# Patient Record
Sex: Female | Born: 1935 | Race: Black or African American | Hispanic: No | Marital: Single | Smoking: Former smoker
Health system: Southern US, Community
[De-identification: ages and names within clinical notes are randomized; demographics above are authoritative.]

## PROBLEM LIST (undated history)

## (undated) DIAGNOSIS — R790 Abnormal level of blood mineral: Secondary | ICD-10-CM

## (undated) DIAGNOSIS — R1110 Vomiting, unspecified: Secondary | ICD-10-CM

## (undated) DIAGNOSIS — I10 Essential (primary) hypertension: Secondary | ICD-10-CM

## (undated) DIAGNOSIS — M6282 Rhabdomyolysis: Secondary | ICD-10-CM

## (undated) DIAGNOSIS — N183 Chronic kidney disease, stage 3 unspecified: Secondary | ICD-10-CM

## (undated) DIAGNOSIS — Z8679 Personal history of other diseases of the circulatory system: Secondary | ICD-10-CM

## (undated) DIAGNOSIS — E876 Hypokalemia: Secondary | ICD-10-CM

## (undated) DIAGNOSIS — C3490 Malignant neoplasm of unspecified part of unspecified bronchus or lung: Secondary | ICD-10-CM

## (undated) DIAGNOSIS — I442 Atrioventricular block, complete: Secondary | ICD-10-CM

## (undated) DIAGNOSIS — Z950 Presence of cardiac pacemaker: Secondary | ICD-10-CM

## (undated) DIAGNOSIS — A0472 Enterocolitis due to Clostridium difficile, not specified as recurrent: Secondary | ICD-10-CM

## (undated) DIAGNOSIS — K219 Gastro-esophageal reflux disease without esophagitis: Secondary | ICD-10-CM

## (undated) DIAGNOSIS — I272 Pulmonary hypertension, unspecified: Secondary | ICD-10-CM

## (undated) DIAGNOSIS — R0600 Dyspnea, unspecified: Secondary | ICD-10-CM

## (undated) DIAGNOSIS — E7211 Homocystinuria: Secondary | ICD-10-CM

## (undated) DIAGNOSIS — I639 Cerebral infarction, unspecified: Secondary | ICD-10-CM

## (undated) DIAGNOSIS — I480 Paroxysmal atrial fibrillation: Secondary | ICD-10-CM

## (undated) DIAGNOSIS — R269 Unspecified abnormalities of gait and mobility: Secondary | ICD-10-CM

## (undated) DIAGNOSIS — E785 Hyperlipidemia, unspecified: Secondary | ICD-10-CM

## (undated) DIAGNOSIS — M109 Gout, unspecified: Secondary | ICD-10-CM

## (undated) DIAGNOSIS — Z8673 Personal history of transient ischemic attack (TIA), and cerebral infarction without residual deficits: Secondary | ICD-10-CM

## (undated) HISTORY — DX: Homocystinuria: E72.11

## (undated) HISTORY — DX: Personal history of transient ischemic attack (TIA), and cerebral infarction without residual deficits: Z86.73

## (undated) HISTORY — PX: CARDIAC CATHETERIZATION: SHX172

## (undated) HISTORY — DX: Personal history of other diseases of the circulatory system: Z86.79

## (undated) HISTORY — DX: Hypokalemia: E87.6

## (undated) HISTORY — DX: Atrioventricular block, complete: I44.2

## (undated) HISTORY — DX: Unspecified abnormalities of gait and mobility: R26.9

## (undated) HISTORY — PX: PACEMAKER INSERTION: SHX728

## (undated) HISTORY — DX: Hyperlipidemia, unspecified: E78.5

## (undated) HISTORY — DX: Presence of cardiac pacemaker: Z95.0

---

## 1997-11-07 ENCOUNTER — Emergency Department (HOSPITAL_COMMUNITY): Admission: EM | Admit: 1997-11-07 | Discharge: 1997-11-07 | Payer: Self-pay | Admitting: Emergency Medicine

## 1997-11-18 ENCOUNTER — Emergency Department (HOSPITAL_COMMUNITY): Admission: EM | Admit: 1997-11-18 | Discharge: 1997-11-18 | Payer: Self-pay | Admitting: Emergency Medicine

## 2002-06-18 ENCOUNTER — Inpatient Hospital Stay (HOSPITAL_COMMUNITY): Admission: EM | Admit: 2002-06-18 | Discharge: 2002-06-23 | Payer: Self-pay

## 2002-06-18 ENCOUNTER — Encounter: Payer: Self-pay | Admitting: Emergency Medicine

## 2002-06-18 ENCOUNTER — Encounter: Payer: Self-pay | Admitting: Cardiology

## 2002-06-21 ENCOUNTER — Encounter: Payer: Self-pay | Admitting: Pediatrics

## 2002-06-21 ENCOUNTER — Encounter: Payer: Self-pay | Admitting: Neurology

## 2002-06-22 ENCOUNTER — Encounter: Payer: Self-pay | Admitting: Cardiology

## 2002-06-23 ENCOUNTER — Inpatient Hospital Stay (HOSPITAL_COMMUNITY)
Admission: RE | Admit: 2002-06-23 | Discharge: 2002-07-08 | Payer: Self-pay | Admitting: Physical Medicine & Rehabilitation

## 2002-08-26 ENCOUNTER — Encounter
Admission: RE | Admit: 2002-08-26 | Discharge: 2002-11-24 | Payer: Self-pay | Admitting: Physical Medicine & Rehabilitation

## 2004-09-07 ENCOUNTER — Ambulatory Visit (HOSPITAL_COMMUNITY): Admission: RE | Admit: 2004-09-07 | Discharge: 2004-09-07 | Payer: Self-pay | Admitting: Family Medicine

## 2004-09-21 ENCOUNTER — Ambulatory Visit (HOSPITAL_COMMUNITY): Admission: RE | Admit: 2004-09-21 | Discharge: 2004-09-22 | Payer: Self-pay | Admitting: *Deleted

## 2010-02-06 ENCOUNTER — Ambulatory Visit: Payer: Self-pay | Admitting: Cardiology

## 2010-02-18 ENCOUNTER — Encounter: Payer: Self-pay | Admitting: Internal Medicine

## 2010-03-08 ENCOUNTER — Ambulatory Visit: Payer: Self-pay | Admitting: Internal Medicine

## 2010-05-30 NOTE — Miscellaneous (Signed)
Summary: Device preload  Clinical Lists Changes  Observations: Added new observation of PPM INDICATN: CHB (02/18/2010 17:22) Added new observation of MAGNET RTE: BOL 85 ERI 65 (02/18/2010 17:22) Added new observation of PPMLEADSTAT2: active (02/18/2010 17:22) Added new observation of PPMLEADSER2: [id number] (02/18/2010 17:22) Added new observation of PPMLEADMOD2: 5076  (02/18/2010 17:22) Added new observation of PPMLEADDOI2: 09/21/2004  (02/18/2010 17:22) Added new observation of PPMLEADLOC2: RV  (02/18/2010 17:22) Added new observation of PPMLEADSTAT1: active  (02/18/2010 17:22) Added new observation of PPMLEADSER1: [id number]  (02/18/2010 17:22) Added new observation of PPMLEADMOD1: 5076  (02/18/2010 17:22) Added new observation of PPMLEADDOI1: 09/21/2004  (02/18/2010 17:22) Added new observation of PPMLEADLOC1: RA  (02/18/2010 17:22) Added new observation of PPM DOI: 09/21/2004  (02/18/2010 17:22) Added new observation of PPM SERL#: [id number]  (02/18/2010 17:22) Added new observation of PPM MODL#: [id number]  (02/18/2010 62:22) Added new observation of PACEMAKERMFG: Medtronic  (02/18/2010 17:22) Added new observation of PPM IMP MD: Charlynn Court  (02/18/2010 17:22) Added new observation of PPM REFER MD: Peter Swaziland, MD  (02/18/2010 17:22) Added new observation of PACEMAKER MD: Sherryl Manges, MD  (02/18/2010 17:22)      PPM Specifications Following MD:  Sherryl Manges, MD     Referring MD:  Peter Swaziland, MD PPM Vendor:  Medtronic     PPM Model Number:  [id number]     PPM Serial Number:  [id number] PPM DOI:  09/21/2004     PPM Implanting MD:  Charlynn Court  Lead 1    Location: RA     DOI: 09/21/2004     Model #: 1448     Serial #: [id number]     Status: active Lead 2    Location: RV     DOI: 09/21/2004     Model #: 2637     Serial #: [id number]     Status: active  Magnet Response Rate:  BOL 85 ERI 65  Indications:  CHB

## 2010-05-30 NOTE — Procedures (Signed)
Summary: pacer check/medtronic   Current Medications (verified): 1)  Simvastatin 40 Mg Tabs (Simvastatin) .... One By Mouth Daily 2)  Metoprolol Succinate 50 Mg Xr24h-Tab (Metoprolol Succinate) .... 1/2 By Mouth Daily 3)  Plavix 75 Mg Tabs (Clopidogrel Bisulfate) .... One By Mouth Daily 4)  Amlodipine Besy-Benazepril Hcl 5-20 Mg Caps (Amlodipine Besy-Benazepril Hcl) .... One By Mouth Daily 5)  Aspir-Low 81 Mg Tbec (Aspirin) .... One By Mouth Daily  Allergies (verified): No Known Drug Allergies  PPM Specifications Following MD:  Sherryl Manges, MD     Referring MD:  Peter Swaziland, MD PPM Vendor:  Medtronic     PPM Model Number:  [id number]     PPM Serial Number:  [id number] PPM DOI:  09/21/2004     PPM Implanting MD:  Charlynn Court  Lead 1    Location: RA     DOI: 09/21/2004     Model #: 5076     Serial #: [id number]     Status: active Lead 2    Location: RV     DOI: 09/21/2004     Model #: 1308     Serial #: [id number]     Status: active  Magnet Response Rate:  BOL 85 ERI 65  Indications:  CHB   PPM Follow Up Battery Voltage:  2.75 V     Battery Est. Longevity:  4.5 yrs       PPM Device Measurements Atrium  Amplitude: 5.60 mV, Impedance: 480 ohms, Threshold: 0.50 V at 0.40 msec Right Ventricle  Amplitude: 4.00 mV, Impedance: 548 ohms, Threshold: 0.750 V at 0.40 msec  Episodes MS Episodes:  45     Percent Mode Switch:  <0.1%     Ventricular High Rate:  0     Atrial Pacing:  19.8%     Ventricular Pacing:  99.9%r  Parameters Mode:  DDDR     Lower Rate Limit:  60     Upper Rate Limit:  130 Paced AV Delay:  150     Sensed AV Delay:  120 Next Cardiology Appt Due:  06/07/2010 Tech Comments:  3 AHR EPISODES--LONGEST WAS 2 MINUTES 11 SECONDS. NORMAL DEVICE FUNCTION. CHANGED RA OUTPUT FROM 1.00 TO 2.00 AND RV OUTPUT FROM 2.00 TO 2.50 V. PT IS ENROLLED IN [facility].  ROV 06-07-10 @ 1000 W/SK. Vella Kohler  March 08, 2010 1:22 PM

## 2010-06-07 ENCOUNTER — Encounter: Payer: Self-pay | Admitting: Internal Medicine

## 2010-06-07 ENCOUNTER — Encounter (INDEPENDENT_AMBULATORY_CARE_PROVIDER_SITE_OTHER): Payer: MEDICARE | Admitting: Internal Medicine

## 2010-06-07 DIAGNOSIS — I6350 Cerebral infarction due to unspecified occlusion or stenosis of unspecified cerebral artery: Secondary | ICD-10-CM | POA: Insufficient documentation

## 2010-06-07 DIAGNOSIS — I442 Atrioventricular block, complete: Secondary | ICD-10-CM | POA: Insufficient documentation

## 2010-06-07 DIAGNOSIS — I119 Hypertensive heart disease without heart failure: Secondary | ICD-10-CM

## 2010-06-15 NOTE — Assessment & Plan Note (Signed)
Summary: pc2/no avail pacer slots/sl/kl   CC:  pacer check. Pt states she is doing well.  She has no complaints at this time.  History of Present Illness: Mis seen to establish pacemaker followup. She is a 77 year old woman with a history of complete heart block status post pacemaker implantation 2006.  Her related medical history is notable for hypertension prior stroke x2 and dyslipidemia. She has no prior history of atrial fibrillation.   embolic risk factors include hypertension tender prior stroke age-x1  Preventive Screening-Counseling & Management  Alcohol-Tobacco     Smoking Status: quit  Caffeine-Diet-Exercise     Does Patient Exercise: no  Current Medications (verified): 1)  Simvastatin 40 Mg Tabs (Simvastatin) .... One By Mouth Daily 2)  Metoprolol Succinate 50 Mg Xr24h-Tab (Metoprolol Succinate) .... 1/2 By Mouth Daily 3)  Plavix 75 Mg Tabs (Clopidogrel Bisulfate) .... One By Mouth Daily 4)  Amlodipine Besy-Benazepril Hcl 5-20 Mg Caps (Amlodipine Besy-Benazepril Hcl) .... One By Mouth Daily 5)  Aspir-Low 81 Mg Tbec (Aspirin) .... One By Mouth Daily 6)  Omeprazole 20 Mg Cpdr (Omeprazole) .... Take One Capsule Once Daily  Allergies (verified): No Known Drug Allergies  Past History:  Family History: Last updated: 06/07/2010 Positive for hypertension  Social History: Last updated: 06/07/2010 Tobacco Use - Former. quit 2007 widowed mother of 3 ; she also raised her biological grandson and his sister the former is in prison Alcohol Use - no Regular Exercise - no  Past Medical History: Pacemaker implanted-2006  Medtronic [phone] complete heart block hypertension hypercholesterolemia CVA  Past Surgical History: DDD pacemaker implant-Medtronic EnPulse 2006  Family History: Positive for hypertension  Social History: Tobacco Use - Former. quit 2007 widowed mother of 3 ; she also raised her biological grandson and his sister the former is in prison Alcohol  Use - no Regular Exercise - no Smoking Status:  quit Does Patient Exercise:  no  Review of Systems  The patient denies anorexia, fever, weight loss, weight gain, decreased hearing, hoarseness, syncope, prolonged cough, abdominal pain, melena, suspicious skin lesions, transient blindness, unusual weight change, abnormal bleeding, and angioedema.    Vital Signs:  Patient profile:   77 year old female Height:      62 inches Weight:      204 pounds BMI:     37.45 Pulse rate:   82 / minute Pulse rhythm:   regular BP sitting:   146 / 90  (left arm) Cuff size:   large  Vitals Entered By: Judithe Modest CMA (June 07, 2010 9:50 AM)  Physical Exam  General:  The patient was alert and oriented in no acute distress. HEENT Normal.  Neck veins were flat, carotids were brisk.  Lungs were clear.  Heart sounds were regular without murmurs or gallops.  Abdomen was soft with active bowel sounds. There is no clubbing cyanosis or edema. Skin Warm and dry gait is wide-based and she uses a stick pacemaker pocket is on the left and is well-healed   CXR  Procedure date:  09/21/2004  Findings:         Clinical Data:    Pacemaker insertion.   CHEST - 2 VIEWS:   Comparison:   09/07/04.   Findings:   A left subclavian pacemaker device has been placed.   The   tips of the leads are in the right atrium and right ventricle.  No   pneumothoraces are seen.  The heart is normal in size.  The lungs are  clear.   IMPRESSION:   Left subclavian pacemaker placement without pneumothorax.    Read By:  Jolaine Click,  M.D.  Echocardiogram  Procedure date:  06/18/2002  Findings:        SUMMARY   -  Overall left ventricular systolic function was normal. Left         ventricular ejection fraction was estimated , range being 55         % to 65 %. Left ventricular wall thickness was mildly         increased. There was mild focal basal septal hypertrophy.         There was an increased relative  contribution of atrial         contraction to left ventricular filling.   -  Aortic valve thickness was mildly increased.   -  Left atrial size was at the upper limits of normal.   -  No cardiac source of embolus.    IMPRESSIONS   -  No cardiac source of embolus.     ---------------------------------------------------------------    Prepared and signed by    Clovis Pu. Brackbill M.D.   Confirmed 18-Jun-2002 23:32:42   PPM Specifications Following MD:  Sherryl Manges, MD     Referring MD:  Peter Swaziland, MD PPM Vendor:  Medtronic     PPM Model Number:  [id number]     PPM Serial Number:  [id number] PPM DOI:  09/21/2004     PPM Implanting MD:  Charlynn Court  Lead 1    Location: RA     DOI: 09/21/2004     Model #: 5076     Serial #: [id number]     Status: active Lead 2    Location: RV     DOI: 09/21/2004     Model #: 1308     Serial #: [id number]     Status: active  Magnet Response Rate:  BOL 85 ERI 65  Indications:  CHB   PPM Follow Up Battery Voltage:  2.74 V     Battery Est. Longevity:  3.5 yrs       PPM Device Measurements Atrium  Amplitude: 4.00 mV, Impedance: 501 ohms, Threshold: 0.50 V at 0.40 msec Right Ventricle  Amplitude: PACED mV, Impedance: 571 ohms, Threshold: O.750 V at 0.40 msec  Episodes MS Episodes:  2     Percent Mode Switch:  <0.1%     Ventricular High Rate:  0     Atrial Pacing:  28.5%     Ventricular Pacing:  99.9%  Parameters Mode:  DDDR     Lower Rate Limit:  60     Upper Rate Limit:  130 Paced AV Delay:  150     Sensed AV Delay:  120 Next Remote Date:  09/07/2010     Next Cardiology Appt Due:  06/01/2011 Tech Comments:  2 AHR EPISODES--LONGEST WAS 1 MIN 47 SECONDS.  NORMAL DEVICE FUNCTION. NO CHANGES MADE. [facility] 09-07-10 AND ROV IN 12 MTHS W/SK. Vella Kohler  June 07, 2010 10:50 AM  Impression & Recommendations:  Problem # 1:  ATRIAL FIBRILLATION-POSSIBLE (ICD-427.31) I ythink she may have atrial fibrillation  based on the ECG; If that were  true she would need oral anticoagulation as opposed aspirin and Plavix. There was on interrogation of the device as single atrial high rate episodes with cycle length in the low 300s range. This would not be consistent with atrial fibrillation. We will follow this via [facility] Her updated medication  list for this problem includes:    Metoprolol Succinate 50 Mg Xr24h-tab (Metoprolol succinate) .[name]... 1/2 by mouth daily    Plavix 75 Mg Tabs (Clopidogrel bisulfate) ..... One by mouth daily    Aspir-low 81 Mg Tbec (Aspirin) ..... One by mouth dai  Problem # 2:  PACEMAKER MDT DDD (ICD-V45.01) Device parameters and data were reviewed and no changes were made  Problem # 3:  CVA (ICD-434.91) as above Her updated medication list for this problem includes:    Plavix 75 Mg Tabs (Clopidogrel bisulfate) ..... One by mouth daily    Aspir-low 81 Mg Tbec (Aspirin) ..... One by mouth daily  Problem # 4:  HYPERTENSION, HEART CONTROLLED W/O ASSOC CHF (ICD-402.10) his relatively poorly controlled. She will her last notes from Dr. Swaziland demonstrated improved control so we'll defer any drug change it to him Her updated medication list for this problem includes:    Metoprolol Succinate 50 Mg Xr24h-tab (Metoprolol succinate) .[name]... 1/2 by mouth daily    Amlodipine Besy-benazepril Hcl 5-20 Mg Caps (Amlodipine besy-benazepril hcl) ..... One by mouth daily    Aspir-low 81 Mg Tbec (Aspirin) ..... One by mouth daily  Problem # 5:  AV BLOCK, COMPLETE (ICD-426.0) stable Her updated medication list for this problem includes:    Metoprolol Succinate 50 Mg Xr24h-tab (Metoprolol succinate) .[name]... 1/2 by mouth daily    Plavix 75 Mg Tabs (Clopidogrel bisulfate) ..... One by mouth daily    Amlodipine Besy-benazepril Hcl 5-20 Mg Caps (Amlodipine besy-benazepril hcl) ..... One by mouth daily    Aspir-low 81 Mg Tbec (Aspirin) ..... One by mouth daily  Patient Instructions: 1)  Your physician recommends that you schedule a  follow-up appointment in: 1 year with Dr. Graciela Husbands. 2)  [facility] device check to be done on Sep 07, 2010 3)  Your physician recommends that you continue on your current medications as directed. Please refer to the Current Medication list given to you today.

## 2010-07-06 NOTE — Cardiovascular Report (Signed)
Summary: Office Visit   Office Visit   Imported By: Roderic Ovens 06/26/2010 15:49:36  _____________________________________________________________________  External Attachment:    Type:   Image     Comment:   External Document

## 2010-09-15 NOTE — Discharge Summary (Signed)
[name], Allison           ACCOUNT NO.:  [id number]   MEDICAL RECORD NO.:  [id number]          PATIENT TYPE:  OIB   LOCATION:  4707                         FACILITY:  [facility]   PHYSICIAN:  Elmore Guise., M.D.DATE OF BIRTH:  December 25, 1933   DATE OF ADMISSION:  09/21/2004  DATE OF DISCHARGE:  09/22/2004                                 DISCHARGE SUMMARY   DISCHARGE DIAGNOSES:  1.  Complete heart block.  2.  Status post permanent pacemaker implant with Medtronic Enpulse dual-      chamber pacemaker.  3.  History of hypertension.  4.  History of stroke.   HISTORY OF PRESENT ILLNESS:  The patient is a very pleasant 77 year old  African-American female who presents with 1-2 month history of increasing  malaise. She was found to be bradycardiac and EKG showed complete heart  block. She was admitted for pacemaker implant.   HOSPITAL COURSE:  The patient underwent dual-chamber pacemaker implant on  09/21/2004. She tolerated the procedure well. She had no postprocedure  complications. Her chest x-ray today showed appropriate placement of her RA  and RV leads and no pneumothorax. Her pacemaker was interrogated and  functioning appropriately. She will be discharged home today to continue the  following medications.   DISCHARGE MEDICATIONS:  1.  Lotrel 5/20 milligrams once a day.  2.  Aspirin 81 milligrams once a day.  3.  Plavix 75 milligrams once a day.  4.  Augmentin 875 milligrams p.o. b.i.d. times 5 days.  5.  Tylenol Extra Strength 500 milligrams q.6 h on a p.r.n. basis.   DISCHARGE INSTRUCTIONS:  She was given and routine post pacemaker  restrictions not to get her site wet for the next 5-7 days. She was also  given Betadine swabs to Betadine the area daily for the next 3 days. Due to  the length of her procedure as well as the depth of her wound, she was  placed on prophylactic antibiotics. She had no fever or infection at the  site. She will follow up with Dr. Reyes Ivan at  [facility] in 7-10  days. She was to notify the office should she have any further questions or  concerns. A post pacemaker restriction sheet was given to the patient prior  to discharge.      TWK/MEDQ  D:  09/22/2004  T:  09/22/2004  Job:  [id number]

## 2010-09-15 NOTE — Discharge Summary (Signed)
   [name], SANDEFUR                       ACCOUNT NO.:  [id number]   MEDICAL RECORD NO.:  [id number]                   PATIENT TYPE:  INP   LOCATION:  3714                                 FACILITY:  [facility]   PHYSICIAN:  Michael L. Thad Ranger, M.D.           DATE OF BIRTH:  [birth year]   DATE OF ADMISSION:  06/18/2002  DATE OF DISCHARGE:  06/23/2002                                 DISCHARGE SUMMARY   DISCHARGE DIAGNOSES:  1. Right posterior internal capsule acute infarction secondary to small     vessel disease.  2. Hypertension.  3. Dyslipidemia.  4. Hyperhomocysteinemia.  5. Intermittent second degree arteriovenous block, asymptomatic.  6. Hypokalemia.   INCOMPLETE REPORT     Annie Main, N.P.                         Marolyn Hammock. Thad Ranger, M.D.    SB/MEDQ  D:  06/23/2002  T:  06/23/2002  Job:  [id number]   cc:   Cassell Clement, M.D.  [address]  [place]  [place] [address]  Fax: [phone]   Titus Dubin. Alwyn Ren, M.D. [facility]

## 2010-09-15 NOTE — Discharge Summary (Signed)
NAMETACHINA, SPOONEMORE                       ACCOUNT NO.:  [id number]   MEDICAL RECORD NO.:  [id number]                   PATIENT TYPE:  IPS   LOCATION:  4038                                 FACILITY:  [facility]   PHYSICIAN:  Mariam Dollar, P.A.               DATE OF BIRTH:  02/28/1934   DATE OF ADMISSION:  06/23/2002  DATE OF DISCHARGE:  07/08/2002                                 DISCHARGE SUMMARY   DISCHARGE DIAGNOSES:  1. PLIC acute infarction.  2. Hypertension.  3. Hypokalemia, resolved.  4. Intermittent atrioventricular block of which asymptomatic.  5. Tobacco abuse.  6. Hyperlipidemia.   HISTORY OF PRESENT ILLNESS:  A 77 year old, right-handed, black female with  a history of untreated hypertension who was admitted 06/18/2002 with left-  sided weakness and blood pressure 227/115 and bradycardia at 40 beats per  minute.  There was no chest pain, no nausea, or vomiting.  Upon evaluation,  cranial CT scan with acute subacute inferior infarction bilateral and basal  ganglia, left thalamus.  Also noted old right lacunar infarction.  Carotid  duplex negative.  Cardiac enzymes negative.  Placed on nicardipine drip and  blood pressure was monitored.  MRI with acute deep white matter infarction  affecting the posterior limb of the right internal capsule, chronic left  thalamic lacunar/small vessel disease.  MRA with widespread intracranial  atherosclerotic changes.  Per neurology consult with Dr. Thad Ranger, placed on  aspirin and Plavix therapy.  Cardiology followed with Dr. Patty Sermons for  increased blood pressure and bradycardia.  Echocardiogram with ejection  fraction 55-65% and mild aortic valve sclerosis.  No indication for  pacemaker was needed.  Hypokalemia 2.8 and supplemented. Chest x-ray  06/22/2002 negative.  Multi hypertensive medications added with Norvasc,  hydrochlorothiazide, Altace and Catapres.  Monitored on telemetry unit.  Normal sinus rhythm, occasional AV block.   Cardiac status remained stable.  Minimal assist transfers and ambulation.  Latest chemistries unremarkable.  Admitted for a comprehensive rehabilitation program.   PAST MEDICAL HISTORY:  See discharge diagnoses.   ALLERGIES:  PENICILLIN, DEMEROL and CONTRAST MEDIA.   SOCIAL HISTORY:  Smokes two packs a day.  Denies alcohol.  Lives with  granddaughter in [place].  Independent prior to admission and driving.  Her  80 year old granddaughter attends school.  One level home with five steps to  entry.  Local brother works and provides care for elderly mother.   MEDICATIONS:  She was on no medication prior to admission.   PRIMARY MD:  Dr. Ronne Binning, although she has not seen him for many years.   HOSPITAL COURSE:  Patient did well on rehabilitation services with therapies  initiated on a b.i.d. basis.  The following issues were followed during  patient's rehab course.  Pertaining to Ms. Toruno's PLIC acute infarction,  she remained stable, maintained on aspirin and Plavix therapy.  Left upper  extremity weakness gross to graded at 3+ to 4-/5.  She  exhibited no unsafe  behavior.  She was modified independent for her bathing and dressing,  modified independent for her transfers, ambulating independently with a  rolling walker greater than 150 feet.  Close supervision to navigate steps.  Home health physical and occupational therapy would be arranged.  Blood  pressures remained monitored.  It was noted that the patient was on no  present antihypertensive medications when she was admitted to the hospital.  She had seen Dr. Ronne Binning some years ago.  She had received follow up per  cardiology services, Dr. Patty Sermons, now on Norvasc, Catapres,  hydrochlorothiazide and Altace.  It was discussed at length the need to  maintain these blood pressure medications.  Her hypokalemia had resolved  with latest potassium of 4.1.  It was discussed at length the need for  cessation of smoking.  It was  questionable if she will be compliant with  this request.  She had refused all Nicoderm patches.  She had since been  placed on Crestor for her hyperlipidemia.  She had no bowel or bladder  disturbances.   Latest labs showed a sodium of 136, potassium 4.1, BUN 21, creatinine 0.9,  hemoglobin 15.9, hematocrit 46.2.  All family teaching was completed.  She  was discharged to home.  Day passes had gone well, and she was scheduled for  discharge on 07/08/2002.   DISCHARGE MEDICATIONS:  1. Norvasc 5 mg daily.  2. Ecotrin 325 mg daily.  3. Catapres 0.1 mg patch, change every Thursday.  4. Plavix 75 mg daily.  5. Hydrochlorothiazide 12.5 mg daily.  6. Altace 5 mg daily.  7. Foltx tablet daily.  8. Crestor 10 mg two tablets daily.  9. Tylenol as needed.   ACTIVITY:  As tolerated with rolling walker.   DIET:  Regular.   SPECIAL INSTRUCTIONS:  Home health physical and occupational therapy.  The  patient should follow up with Dr. Ronne Binning if willing to continue to follow  up the patient as a primary.  Dr. Ellwood Dense will continue to follow the  patient in outpatient rehabilitation services in approximately four to six  weeks to monitor progress of cerebrovascular accident.  Dr. Patty Sermons,  cardiology services, as needed.                                               Mariam Dollar, P.A.    DA/MEDQ  D:  07/07/2002  T:  07/08/2002  Job:  [id number]   cc:   Ellwood Dense, M.D.  [address]  [place]  [place] [address]  Fax: [phone]   Marolyn Hammock. Thad Ranger, M.D.  [address]  [address]  [place]  [place] [address]  Fax: [phone]   Cassell Clement, M.D.  [address], [address]  [place]  [place] [address]  Fax: [phone]   Dr. Ronne Binning

## 2010-09-15 NOTE — H&P (Signed)
[name], [name]           ACCOUNT NO.:  [id number]   MEDICAL RECORD NO.:  [id number]          PATIENT TYPE:  OUT   LOCATION:  XRAY                         FACILITY:  [facility]   PHYSICIAN:  Elmore Guise., M.D.DATE OF BIRTH:  August 21, 1933   DATE OF ADMISSION:  09/07/2004  DATE OF DISCHARGE:  09/07/2004                                HISTORY & PHYSICAL   PRIMARY CARE PHYSICIAN:  Lorelle Formosa, M.D.   REASON FOR ADMISSION:  Complete heart block.   HISTORY OF PRESENT ILLNESS:  The patient is a very pleasant, 77 year old,  African-American female with a past medical history of hypertension,  dyslipidemia, tobacco dependence who presents for evaluation of bradycardia.  The patient reports decreased energy, no appetite over the last 4-6 weeks.  She was initially seen by her primary care physician back on Sep 07, 2004,  at that time she was sent to the hospital to get a routine EKG done which  showed a complete heart block with a rate of 41 beats per minute.  Chest x-  ray at that time showed no acute cardiopulmonary disease.  The patient  continued to have symptoms of decreased energy and easy fatigability.  No  syncope or presyncope.  No chest pain.  Went back for further evaluation  today, continued to be bradycardic with a heart rate as low as 40 beats per  minute.  She was then sent to the office for further evaluation.  The  patient actually has no cardiac complaints at this time.  She denies any  orthopnea, PND, chest pain, or palpitations.  She states that since she had  her stroke in 2004, she walks with a cane because she gets unsteady.  She  reports her blood pressures been well-controlled and that she has lost 10-15  pounds.  She does continue to smoke anywhere between 1-2 packs per day,  however, she has decreased recently secondary to not feeling quite right.  She has had no recent fever, chills, nausea, vomiting, or diarrhea.  No  dysuria.   All other review  of systems are negative.   CURRENT MEDICATIONS:  1.  Plavix 75 mg daily.  2.  Aspirin 81 mg daily.  3.  Lotrel daily   ALLERGIES:  None.   FAMILY HISTORY:  Positive for hypertension.   SOCIAL HISTORY:  She does smoke one to two packs per day.  Lives by herself,  however, she has a granddaughter that stays nearby who is 29 years old.  She  does not drive.  She does all of her normal ADLs in and around the house.  She walks unassisted at the house, however, when she goes out she uses a  cane for balance.   PHYSICAL EXAMINATION:  VITAL SIGNS:  Weight is 164 pounds, blood pressure is  126/80, heart rate is 44 and regular.  GENERAL:  She is a very pleasant, elderly, African-American female alert and  oriented x 4, no acute distress.  HEENT:  She has poor dentition.  NECK:  Supple.  No lymphadenopathy.  Two plus carotids.  No JVD.  LUNGS:  Clear.  HEART:  Regular, bradycardiac, with a rate in the 40s, and a 2/6 systolic  ejection murmur.  ABDOMEN:  Soft, nontender, nondistended.  No rebound or guarding.  EXTREMITIES:  Warm with 2+ pulses and no significant edema.   She had an echo done, in 2004, showing an EF of 55-65% and mild aortic valve  sclerosis, otherwise no significant valvular heart disease was noted.  EKG,  in the office, shows complete heart block, rate of 41 per minute, no  significant ST-T wave changes were noted.   IMPRESSION:  1.  Third-degree AV block.  2.  History of hypertension.  3.  Ongoing tobacco dependence.   PLAN:  1.  From cardiovascular standpoint, the patient will be admitted for      permanent pacemaker implant.  I did discuss the risks and benefits with      her at length.  The patient agrees to proceed.  2.  We will check CBC, CMP, PT/INR PTT, as well as TSH prior to her      procedure.  3.  I have asked her to take it easy until her procedure is completed.  4.  Hypertension, well controlled currently.  5.  Ongoing tobacco use.  I discussed the  importance of tobacco cessation      with her at length.   Further recommendations, after her pacemaker is implanted.      TWK/MEDQ  D:  09/19/2004  T:  09/19/2004  Job:  [id number]   cc:   Lorelle Formosa, M.D.  [phone] [address]  [place]  [place] [address]  Fax: [phone]

## 2010-09-15 NOTE — H&P (Signed)
[name], [name]                       ACCOUNT NO.:  [id number]   MEDICAL RECORD NO.:  [id number]                   PATIENT TYPE:  EMS   LOCATION:  MAJO                                 FACILITY:  [facility]   PHYSICIAN:  Michael L. Thad Ranger, M.D.           DATE OF BIRTH:  1933/12/26   DATE OF ADMISSION:  06/18/2002  DATE OF DISCHARGE:                                HISTORY & PHYSICAL   CHIEF COMPLAINT:  Left-sided weakness.   HISTORY OF PRESENT ILLNESS:  This is the initial [facility] stress  service  admission for this 77 year old woman with a past medical history  which includes untreated hypertension.  The patient reports that this  morning she had increasing difficulty with walking.  She thinks this has  actually been going on for a few days, but it was definitely worse this  morning.  Her daughter came over and noted that she had some left-sided  facial droop and alerted EMS and the patient was brought to [facility]  Emergency Room for further evaluation.  Since that time, the patient's  symptoms have been stable.  She has been in the ER to have a very high blood  pressure.  She denies headache, chest pain, shortness of breath or back or  abdominal pain.  She has no known history of a previous stroke and has not  been having neurologic symptoms recently except as above.   PAST MEDICAL HISTORY:  Remarkable for hypertension.  She is presently on no  known medications.  She last saw a physician with [place] a few years ago.  She  has no known history of diabetes or heart disease.   FAMILY HISTORY:  Remarkable for hypertension in several members.   SOCIAL HISTORY:  She lives with her granddaughter but is independent in  activities of daily living.  She smokes about two packs a day.  Denies  alcohol use.   ALLERGIES:  No known drug allergies.   CURRENT MEDICATIONS:  None.   REVIEW OF SYSTEMS:  CONSTITUTIONAL:  She has noted weight gain recently but  denies fever or  chills.  She has had no headache.  Eyes:  No visual changes.  ENT:  Has some dysarthria.  RESPIRATORY:  No shortness of breath, no cough.  CV:  No chest pain, no palpitations.  GI:  No nausea, vomiting, diarrhea.  GU:  No dysuria.  MUSCULOSKELETAL:  No joint pain.  SKIN:  No rash.   PHYSICAL EXAMINATION:  VITAL SIGNS:  Temperature 97.9, blood pressure 195-  227/98 to 115, heart rate 40 to 68, respirations 18.  GENERAL:  She is alert and in no acute distress.  NEUROLOGIC:  Speech is moderately dysarthric but normal in content.  Mood is  euthymic and affect appropriate.  She is completely oriented to time and  place.  She can name objects and repeat a phrase.  Attention span and  concentration and fund of knowledge  are all appropriate.  Cranial nerves:  Funduscopic exam is benign.  Pupils are equal and brisk and reactive.  Extraocular movements are normal without nystagmus.  Visual fields are full  with confrontation.  Hearing is intact and symmetric to finger rub.  Left  face is weak with a droop.  Palate elevates to the right a little bit.  Tongue is midline.  Facial sensation is intact to pinprick.  Shoulder shrug  and strength is normal.  Motor tension normal bulk and tone.  There is  moderate pyramidal weakness of the left upper extremity and minimal weakness  of the left hip flexors and pill extensors.  Sensation is intact to pinprick  in all extremities.  Coordination, rapid movements are slowed on the left.  Finger-to-nose is performed adequately.  Reflexes are symmetric.  Toe is  down on the right and equivocal on the left.  On gait examination, she  stands favoring her right leg and walks with a somewhat unsteady gait.  HEENT:  Normocephalic and atraumatic.  Oropharynx is benign.  NECK:  Supple without carotid bruits.  HEART:  Regular rate and rhythm without murmurs.  CHEST:  Clear to auscultation.  ABDOMEN:  Soft, nontender, nondistended, normoactive bowel sounds.  EXTREMITIES:   No edema, 2+ pulses.   LABORATORY REVIEW:  CBC:  White count 3.6, hemoglobin 15.6, platelets  174,000.  Coags are normal.  BUN is unremarkable.  CT of the head is  personally reviewed and demonstrates old inferior infarcts bilaterally in  the basal ganglia and in the left thalamus without a definite acute lesion.   IMPRESSIONS:  1. Right based stroke with left hemiparesis.  2. Hypertensive crisis.   PLAN:  Will admit to ICU and place on a nicardipine drip, aspirin for stroke  prophylaxis.  Will proceed with the usual stroke workup including MRI, MRA,  carotid Doppler, echocardiogram, etc.                                               Casimiro Needle L. Thad Ranger, M.D.    MLR/MEDQ  D:  06/18/2002  T:  06/18/2002  Job:  [id number]

## 2010-09-15 NOTE — Consult Note (Signed)
NAMESUZZETTE, [name]                       ACCOUNT NO.:  [id number]   MEDICAL RECORD NO.:  [id number]                   PATIENT TYPE:  INP   LOCATION:  3004                                 FACILITY:  [facility]   PHYSICIAN:  Cassell Clement, M.D.              DATE OF BIRTH:  Mar 05, 1934   DATE OF CONSULTATION:  06/21/2002  DATE OF DISCHARGE:                                   CONSULTATION   CHIEF COMPLAINT:  Slow pulse.   HISTORY OF PRESENT ILLNESS:  This is a 77 year old black female admitted  with a cerebrovascular accident on June 18, 2002.  She had been markedly  hypertensive on admission.  She had a past history of high blood pressure,  but had run out of her medication several years ago and had not been back to  see a doctor in about two years.  She denied any history of coronary  disease, chest pain, myocardial infarction, or angina pectoris.  She has not  been experiencing any symptoms or palpitations or bradycardia.  She has had  no symptoms of dizziness or syncope.  Her electrocardiogram on admission  showed possible old inferior wall MI, but no ischemic changes.  Subsequent  EKG today on June 21, 2002, showed no change from admission and raises  question of an old inferior wall MI once again.  Today on telemetry, the  patient had runs of 2:1 AV block.  The patient was asymptomatic during these  episodes.  The episodes were self-limited and resolved without specific  therapy.  At the time of the episodes of AV block, the patient was not on  any beta blocker or calcium channel blocker.   Her 2-D echocardiogram on June 18, 2002, showed normal LV function with  an ejection fraction of 55-65% and mild aortic valve sclerosis.   SOCIAL HISTORY:  The patient smokes two packs of cigarettes a day.  She is  on a moderate low-salt diet at home.   REVIEW OF SYSTEMS:  Unremarkable, except for the present illness.  The  denies any history of diabetes, coronary disease, or  thyroid disease.   PHYSICAL EXAMINATION:  VITAL SIGNS:  Her blood pressure is 160/90 in the  right arm supine.  The pulse is 61 and regular.  She is presently in normal  sinus rhythm with a normal PR interval.  NECK:  Jugular venous pressure is normal.  The carotids are normal.  CHEST:  Clear.  HEART:  A grade 1/6 systolic ejection murmur at the left sternal edge.  There is no S4.  There is no S3.  There is no diastolic murmur.  There is no  rub.  ABDOMEN:  Soft and nontender.  EXTREMITIES:  No phlebitis or edema.  NEUROLOGIC:  She has a partial left hemiparesis.   LABORATORY DATA:  Chest x-ray not done this admission.  The EKG shows no  acute changes.  Recent laboratory work includes a homocystine  level elevated  at 22.75.  The RPR was nonreactive.  Her hemoglobin is 15.6 and white count  3600.  The potassium on admission was 3.2.  The CK-MB and troponin I were  negative x 3.   IMPRESSION:  1. Intermittent second degree atrioventricular block of undetermined     etiology, asymptomatic.  2. Recent hypokalemia possibly contributing to atrioventricular conduction     abnormalities.  3. Hypertensive cardiovascular disease, untreated.  4. Recent cerebrovascular accident occurring in the setting of untreated     severe hypertension.   DISPOSITION:  We are going to transfer her to a cardiac telemetry floor.  We  will get a chest PA and lateral.  Will check a stat BMET to evaluate her  present potassium status.  We will follow with you.  No indication at this  point for permanent pacemaker insertion.                                               Cassell Clement, M.D.    TB/MEDQ  D:  06/21/2002  T:  06/21/2002  Job:  [id number]   cc:   Casimiro Needle L. Thad Ranger, M.D.  [address]  [address]  [name]  [place] [address]  Fax: [phone]

## 2010-09-15 NOTE — Cardiovascular Report (Signed)
[name], [name]           ACCOUNT NO.:  [id number]   MEDICAL RECORD NO.:  [id number]          PATIENT TYPE:  OIB   LOCATION:  2899                         FACILITY:  [facility]   PHYSICIAN:  Elmore Guise., M.D.DATE OF BIRTH:  1934-03-19   DATE OF PROCEDURE:  09/21/2004  DATE OF DISCHARGE:                              CARDIAC CATHETERIZATION   INDICATIONS FOR PROCEDURE:  Complete heart block.   HISTORY OF PRESENT ILLNESS:  The patient is a very pleasant 77 year old  African-American female, past medical history of hypertension who presented  to the office with fatigue and malaise and was found to be in complete heart  block.   DESCRIPTION OF PROCEDURE:  The patient was brought to the cardiac  catheterization laboratory.  After appropriate informed consent she was  prepped and draped in a sterile fashion.  A 2 inch incision was made in the  left deltopectoral groove after appropriate local anesthesia with 30 mL of  1% lidocaine.  A subcutaneous pocket was then made with blunt and Bovie  dissection.  Hemostasis was obtained before procedure was continued.  A  venogram was then performed.  A 7-French safety peel-away sheath was placed  in the left axillary vein under fluoro guidance.  A second wire was then  placed for back-up pacing if needed.  Patient did have two inadvertent  subclavian artery sticks which were noted and resolved with direct pressure.  The second wire was placed under fluoro guidance in the left axillary vein.  A Medtronic active fixation lead 52 cm, serial #[id number] was placed in the  right ventricle.  Upon pacing patient had no return of intrinsic rhythm.  Because of poor threshold at that time a temporary wire was placed in the  second sheath.  The ventricular lead was then placed higher up on the RVOT  and septum.  Appropriate impedance and thresholds were then obtained.  Impedance was 746 ohms with a threshold of 0.5 volts at 0.5 milliseconds and  a  current of 0.8 mA. Patient had no intrinsic R-waves to measure R-wave  voltage.  Temporary pacing wire was then removed.  A Medtronic active  fixation 45 cm lead serial #[id number] was placed in the right atrium under  fluoroscopic guidance.  The following measurements were made:  P-waves  measured 2.8 millivolts with an impedance of 688 ohms.  Threshold was 1  volts at 0.5 milliseconds with a current of 1.8 mA.  Both the atrial and  ventricular leads were then sewed into the pectoralis muscle.  The pocket  was irrigated with kanamycin solution.  An EnPulse [id number] generator was then  placed on the atrial and ventricular leads.  The generator was sewn into the  pocket.  The pocket was closed in three layers with 2-0 followed by 2-0  followed by 4-0 Vicryl.  Steri-Strips were applied above the wound.  There  was no further bleeding.  Patient tolerated procedure well.  No apparent  complications.  She was transferred from the cardiac catheterization  laboratory in stable condition.      TWK/MEDQ  D:  09/21/2004  T:  09/21/2004  Job:  [id number]   cc:   Lorelle Formosa, M.D.  [phone]  [place]  [place] [address]  Fax: [phone]

## 2010-09-15 NOTE — Consult Note (Signed)
NAMEKIMIE, PIDCOCK                       ACCOUNT NO.:  [id number]   MEDICAL RECORD NO.:  [id number]                   PATIENT TYPE:  INP   LOCATION:  3004                                 FACILITY:  [facility]   PHYSICIAN:  Titus Dubin. Alwyn Ren, M.D. [facility]         DATE OF BIRTH:  [birth year]   DATE OF CONSULTATION:  06/21/2002  DATE OF DISCHARGE:                                   CONSULTATION   HISTORY OF PRESENT ILLNESS:  Melvyn Novas, M.D., requested a consultation  because of bradycardia.  The patient has been on telemetry and she has been  notified by the nursing staff that her heart rate has dropped into the 30s-  40s.  The patient has been checked each time and has been asymptomatic.  The  patient is not on any beta blockers at this time for treatment of her  hypertension.   PAST MEDICAL HISTORY:  She has been hospitalized as of June 18, 2002,  with a right base stroke with left hemiparesis in the setting of  hypertensive crisis.   The patient has not had her hypertension treated for several years.  Previously she was a patient of Lorelle Formosa, M.D.   FAMILY HISTORY:  There is a family history of hypertension and there is a  history of stroke in her maternal grandfather and her oldest daughter.   SOCIAL HISTORY:  She does not drink or smoke.   REVIEW OF SYSTEMS:  At this time, she denies any symptoms related to the  bradycardia.   PHYSICAL EXAMINATION:  VITAL SIGNS:  The heart rate at rest is 55-60.  The  blood pressure is ranging 180-190/98-116.  GENERAL APPEARANCE:  She is in no distress.  She exhibits the stigmata of  the prior stroke.  She is ambulatory with help.  HEART:  A grade 1 systolic murmur is noted.  NECK:  The thyroid is normal to palpation.  NEUROLOGIC:  Deep tendon reflexes slightly increased.   LABORATORY DATA:  Homocystine level 22.75.  Potassium 3.3.  Total  cholesterol 175, HDL 47, LDL 110, VLDL 18.   Rhythm strips do show heart rates as  low as 37 with 2:1 block  intermittently.  At this time, her rhythm is regular.  Repeat EKG shows  normal sinus rhythm with a rate of 61.   MEDICATIONS:  Medications at this time include aspirin, hydrochlorothiazide,  clonidine, Altace, Plavix, Reglan, Senokot, and Tylenol.  She is on no  calcium channel blocker, including amlodipine, which would not be  contraindicated.  She is on no beta blockers.   I would recommend a full thyroid profile, although I do not believe this  presents any endocrine problem, but rather is a manifestation of sick sinus  syndrome with the 2:1 block intermittently.   Because of the elevated homocystine level, folic acid at 161 mcg daily would  be recommended.  To lower the LDL below 100 and raise the HDL, a  statin such  as Crestor 20 mg daily would be indicated.  This may be less likely to  further decrease the HDL.   Obviously calcium channel blockers and beta blockers other than amlodipine  or Norvasc could be avoided in treating the hypertension.  Potassium should  be repleted, although this is not the cause of the bradycardia.                                               Titus Dubin. Alwyn Ren, M.D. [facility]    WFH/MEDQ  D:  06/21/2002  T:  06/21/2002  Job:  [id number]   cc:   Melvyn Novas, M.D.  [address]  [address]  [place]  [place] [address]  Fax: [phone]

## 2010-09-15 NOTE — Discharge Summary (Signed)
NAMEDEADRA, DIGGINS                       ACCOUNT NO.:  [id number]   MEDICAL RECORD NO.:  [id number]                   PATIENT TYPE:  INP   LOCATION:  3714                                 FACILITY:  [facility]   PHYSICIAN:  Annie Main, N.P.                   DATE OF BIRTH:  Mar 02, 1934   DATE OF ADMISSION:  06/18/2002  DATE OF DISCHARGE:  06/23/2002                                 DISCHARGE SUMMARY   DIAGNOSES AT DISCHARGE:  1. Right posterior limb internal capsule acute infarction secondary to small     vessel disease.  2. Hypertension.  3. Dyslipidemia.  4. Hyperhomocysteinemia.  5. Intermittent second-degree arteriovenous block.  6. Hyperkalemia.   DISCHARGE MEDICATIONS:  1. Aspirin 325 mg daily.  2. Catapres 0.1 mg patch q.7 days.  3. Plavix 75 mg daily.  4. Potassium 20 mEq t.i.d.  5. Norvasc 5 mg daily.  6. Hydrochlorothiazide 12.5 mg daily.  7. Altace 5 mg daily.  8. Humibid LA b.i.d.  9. Foltx 1 daily.  10.      Crestor 20 mg daily.   STUDIES PERFORMED:  1. A CT of the head on admission showed an acute left thalamic left basal     ganglia infarction with old right caudate head lacunas.  Progressive     small vessel disease.  2. An MRI revealed an acute right posterior limb internal capsule infarction     with atrophy and small vessel disease in the supratentorial area as well     as in the cerebellum and brainstem.  There were old left thalamic     lacunas.  MRA of the head showed widespread intercranial atherosclerosis.  3. Carotid Doppler was normal.  4. Echocardiogram was normal.  5. An EKG initially showed normal sinus rhythm with bradycardia.  Repeat EKG     with secondary AV block.  The patient at discharge was in normal sinus     rhythm.   LABORATORY STUDIES:  Free T3 3.0, free T4 1.21, TSH 1.734.  Thyroid testing  normal.  Chemistry normal.  Hemoglobin elevated at 15.6, white blood cell  3.6, hematocrit 45.5, platelets 174.  Differential was within  normal limits.  Coagulation studies were normal.  Liver function tests were normal.  Homocysteine elevated at 22.75.  Cardiac enzymes negative.  Cholesterol 175,  triglyceride 91, HDL 47, and LDL 110.  RPR was nonreactive.  B12 normal at  396.   HISTORY OF PRESENT ILLNESS:  Ms. Cena Bruhn is a 77 year old right-  handed black female with a history of hypertension, who ran out of her  medicine several years ago and has not been back to see a doctor in two  years.  This morning, she noted she had difficulty walking.  She thinks it  has probably been going on a few days, but is especially worse this morning.  Her daughter came over and noticed that she  had additional left-sided facial  droop and alerted EMS.  The patient was brought to [facility] Emergency Room  for further evaluation.  CT did reveal possible acute infarction.  She was  hypertensive in the emergency room with blood pressure 195 to 227/98.  She  has no known history of previous stroke.  She was admitted to the hospital  for further workup.  She was not a TPA candidate secondary to time.   HOSPITAL COURSE:  An MRA did reveal an acute infarction in the right  posterior limb of the internal capsule as well as remarkable other old  infarcts.  She was admitted to the ICU and placed on a Cardene drip to lower  her blood pressure.  This was used because of low heart rate.  Blood  pressure did improve on the Cardene.  This was discontinued and the patient  was transferred to the floor on p.o. antihypertensives.  Her LDL was  slightly elevated and her HDL was a little low and the patient was placed on  Crestor to improve these numbers.  Her homocysteine was also found to be  elevated and the patient was placed on Foltx.  She will take aspirin and  Plavix for secondary stroke prevention as well.  Bradycardia remained a  problem during hospitalization with heart rate down in the 30s at times.  Internal medicine consult with Dr.  Alwyn Ren and cardiology consult with Dr.  Patty Sermons were placed.  Dr. Patty Sermons felt she had intermittent and second  degree AV block that was essentially asymptomatic.  He felt her recent  hypokalemia of potassium down to 2.8 was a potential source.  She was placed  on potassium with resulting potassium up to 4.1.  Bradycardia improved.  Dr.  Patty Sermons felt there was no indication for pacemaker at this time.   Therapy evaluations reveal she could swallow well and she was placed on thin  liquid, low-salt, low-cholesterol diet.  PT and OT both felt she could  benefit from a short rehabilitation stay to improve her function for her to  return home with minor assistance.   CONDITION ON DISCHARGE:  The patient was alert and oriented x3.  No acute  distress.  Her speech is minimally dysarthric, but no aphasia.  Her chest is  clear to auscultation.  Her heart rate is regular.  She does have left lower  facial weakness, left upper extremity greater than left leg weakness.  Arms  probably 3-4/5 in her biceps and 2-3/5 in her grip.  Her motor strength on  the right is normal.  Her gait favors the right leg and can be unsteady with  turns.  She has a normal sinus rhythm.   PLAN:  1. Discharge to inpatient rehabilitation for continued PT, OT, and speech     therapy as needed.  2. Aspirin and Plavix for secondary stroke prevention.  3. Follow up liver function tests in 4-6 weeks with starting new Crestor.  4. Follow up with Dr. Patty Sermons after discharge from rehabilitation.  5. Make an appointment with Demetrio Lapping, P.A., on a day when Dr.     Thad Ranger is there, 4-6 weeks after discharge from rehabilitation.                                               Annie Main, N.P.    SB/MEDQ  D:  06/23/2002  T:  06/23/2002  Job:  [id number]   cc:   Cassell Clement, M.D.  [address], [address]  [place]  [place] [address] Fax: [phone]   Titus Dubin. Alwyn Ren, M.D. [facility]

## 2010-10-12 ENCOUNTER — Other Ambulatory Visit: Payer: Self-pay | Admitting: Cardiology

## 2010-10-12 DIAGNOSIS — I10 Essential (primary) hypertension: Secondary | ICD-10-CM

## 2010-10-12 NOTE — Telephone Encounter (Signed)
escribe request  

## 2010-10-15 ENCOUNTER — Other Ambulatory Visit: Payer: Self-pay | Admitting: Cardiology

## 2010-10-16 NOTE — Telephone Encounter (Signed)
Med refill

## 2010-12-25 ENCOUNTER — Other Ambulatory Visit: Payer: Self-pay | Admitting: *Deleted

## 2010-12-25 MED ORDER — CLOPIDOGREL BISULFATE 75 MG PO TABS: 75.0000 mg | ORAL_TABLET | Freq: Every day | ORAL | Status: DC

## 2010-12-25 NOTE — Telephone Encounter (Signed)
escribe medication per fax request  

## 2011-03-08 ENCOUNTER — Encounter: Payer: Self-pay | Admitting: Internal Medicine

## 2011-05-07 ENCOUNTER — Other Ambulatory Visit: Payer: Self-pay | Admitting: Cardiology

## 2011-05-07 DIAGNOSIS — I10 Essential (primary) hypertension: Secondary | ICD-10-CM

## 2011-05-07 MED ORDER — AMLODIPINE BESY-BENAZEPRIL HCL 5-20 MG PO CAPS: 1.0000 | ORAL_CAPSULE | Freq: Every day | ORAL | Status: DC

## 2011-05-09 ENCOUNTER — Other Ambulatory Visit: Payer: Self-pay | Admitting: *Deleted

## 2011-05-24 ENCOUNTER — Other Ambulatory Visit: Payer: Self-pay | Admitting: *Deleted

## 2011-05-31 ENCOUNTER — Other Ambulatory Visit: Payer: Self-pay | Admitting: Cardiology

## 2011-05-31 MED ORDER — SIMVASTATIN 40 MG PO TABS: 40.0000 mg | ORAL_TABLET | Freq: Every day | ORAL | Status: DC

## 2011-06-12 ENCOUNTER — Telehealth: Payer: Self-pay | Admitting: Internal Medicine

## 2011-06-12 NOTE — Telephone Encounter (Signed)
12.14.12 SENT PT PAST DUE LETTER/MT 06-12-11 pt to call back after checking on ride to bring her, needs pacemaker check with klein/mt

## 2011-06-25 ENCOUNTER — Other Ambulatory Visit: Payer: Self-pay | Admitting: *Deleted

## 2011-06-25 MED ORDER — CLOPIDOGREL BISULFATE 75 MG PO TABS: 75.0000 mg | ORAL_TABLET | Freq: Every day | ORAL | Status: DC

## 2011-07-25 ENCOUNTER — Ambulatory Visit (INDEPENDENT_AMBULATORY_CARE_PROVIDER_SITE_OTHER): Payer: PRIVATE HEALTH INSURANCE | Admitting: Cardiology

## 2011-07-25 ENCOUNTER — Encounter: Payer: Self-pay | Admitting: Cardiology

## 2011-07-25 VITALS — BP 146/82 | HR 75 | Ht 62.0 in | Wt 210.0 lb

## 2011-07-25 DIAGNOSIS — I442 Atrioventricular block, complete: Secondary | ICD-10-CM

## 2011-07-25 DIAGNOSIS — I10 Essential (primary) hypertension: Secondary | ICD-10-CM

## 2011-07-25 DIAGNOSIS — I119 Hypertensive heart disease without heart failure: Secondary | ICD-10-CM

## 2011-07-25 DIAGNOSIS — E785 Hyperlipidemia, unspecified: Secondary | ICD-10-CM

## 2011-07-25 NOTE — Progress Notes (Signed)
   [name] Date of Birth: Aug 30, 1933 Medical Record #[id number]  History of Present Illness: [name] is seen today for followup. She was last seen by me in October of 2011. She has a history of complete heart block and is status post pacemaker implant in May of 2006 with a Medtronic impulse generator. She is scheduled for followup pacemaker check next month with Dr. Graciela Husbands. She denies any cardiac complaints. She denies any dizziness, lightheadedness, palpitations, chest pain, or shortness of breath. She's had no TIA or CVA symptoms. She reports her blood pressure control has been okay.   Current Outpatient Prescriptions on File Prior to Visit  Medication Sig Dispense Refill  . amLODipine-benazepril (LOTREL) 5-20 MG per capsule Take 1 capsule by mouth daily.  30 capsule  5  . clopidogrel (PLAVIX) 75 MG tablet Take 1 tablet (75 mg total) by mouth daily.  32 tablet  5  . simvastatin (ZOCOR) 40 MG tablet Take 1 tablet (40 mg total) by mouth at bedtime.  30 tablet  1    No Known Allergies  Past Medical History  Diagnosis Date  . Complete heart block   . History of hypertension   . History of stroke   . Hypokalemia   . Hyperlipidemia   . Hyperhomocysteinemia     Past Surgical History  Procedure Date  . Pacemaker insertion     Medtronic Enpulse dual-chamber pacemaker  . Cardiac catheterization     History  Smoking status  . Former Smoker  . Quit date: 07/25/2006  Smokeless tobacco  . Not on file    History  Alcohol Use: Not on file    Family History  Problem Relation Age of Onset  . Hypertension Mother     Review of Systems: As noted in history of present illness.  All other systems were reviewed and are negative.  Physical Exam: BP 146/82  Pulse 75  Ht 5\' 2"  (1.575 m)  Wt 210 lb (95.255 kg)  BMI 38.41 kg/m2 She is an obese black female in no acute distress. Her HEENT exam is unremarkable. Pupils are equal round and reactive. Oropharynx is clear. Neck  is without JVD or bruits. Lungs are clear. Cardiac exam reveals a regular rate and rhythm without gallop, murmur, or click. Her pacemaker site in her left upper chest is normal. Abdomen is obese, soft, nontender without masses or bruits. She has no edema. Pedal pulses are palpable. She is alert and oriented x3. Cranial nerves II through XII are intact. Skin is warm and dry. LABORATORY DATA: ECG shows normal sinus rhythm with atrial sensing and ventricular pacing.  Assessment / Plan:

## 2011-07-25 NOTE — Assessment & Plan Note (Signed)
Blood pressure appears to be well-controlled on her current medications. We will continue the same.

## 2011-07-25 NOTE — Assessment & Plan Note (Signed)
She is status post pacemaker implant in May of 2006. She had scheduled followup in our pacemaker clinic next month. I stressed the importance of regular pacemaker follow

## 2011-07-25 NOTE — Patient Instructions (Signed)
Keep your appointment with Dr. Graciela Husbands for your pacemaker check  I will see you again in 1 year.

## 2011-08-07 ENCOUNTER — Ambulatory Visit (INDEPENDENT_AMBULATORY_CARE_PROVIDER_SITE_OTHER): Payer: PRIVATE HEALTH INSURANCE | Admitting: Internal Medicine

## 2011-08-07 ENCOUNTER — Encounter: Payer: Self-pay | Admitting: Internal Medicine

## 2011-08-07 VITALS — BP 160/98 | HR 73 | Ht 62.0 in | Wt 208.4 lb

## 2011-08-07 DIAGNOSIS — I442 Atrioventricular block, complete: Secondary | ICD-10-CM

## 2011-08-07 DIAGNOSIS — I4891 Unspecified atrial fibrillation: Secondary | ICD-10-CM

## 2011-08-07 DIAGNOSIS — Z950 Presence of cardiac pacemaker: Secondary | ICD-10-CM | POA: Insufficient documentation

## 2011-08-07 DIAGNOSIS — G4710 Hypersomnia, unspecified: Secondary | ICD-10-CM

## 2011-08-07 DIAGNOSIS — I119 Hypertensive heart disease without heart failure: Secondary | ICD-10-CM

## 2011-08-07 DIAGNOSIS — R400 Somnolence: Secondary | ICD-10-CM | POA: Insufficient documentation

## 2011-08-07 HISTORY — DX: Presence of cardiac pacemaker: Z95.0

## 2011-08-07 LAB — PACEMAKER DEVICE OBSERVATION
ATRIAL PACING PM: 23
BAMS-0001: 175 {beats}/min
RV LEAD IMPEDENCE PM: 538 Ohm
VENTRICULAR PACING PM: 100

## 2011-08-07 MED ORDER — LABETALOL HCL 200 MG PO TABS: 200.0000 mg | ORAL_TABLET | Freq: Two times a day (BID) | ORAL | Status: DC

## 2011-08-07 MED ORDER — LISINOPRIL-HYDROCHLOROTHIAZIDE 20-12.5 MG PO TABS: 1.0000 | ORAL_TABLET | Freq: Every day | ORAL | Status: DC

## 2011-08-07 NOTE — Assessment & Plan Note (Signed)
Poorly controlled. She has some edema which may be related to her amlodipine  I will take the liberty of beginning her on labetalol and changing her amlodipine/ACE inhibitor combination to an ACE inhibitor/HCT. She'll follow with Dr. Nathanial Rancher in the next couple of weeks.

## 2011-08-07 NOTE — Assessment & Plan Note (Signed)
I suspect she has sleep apnea. I will defer the evaluation to Dr. Nathanial Rancher. We can be of assistance with a home sleep apnea evaluation monitor and she would like.

## 2011-08-07 NOTE — Assessment & Plan Note (Signed)
The patient's device was interrogated.  The information was reviewed. No changes were made in the programming.    

## 2011-08-07 NOTE — Assessment & Plan Note (Signed)
As above Stable

## 2011-08-07 NOTE — Assessment & Plan Note (Signed)
She has an up to 8 minutes detected on her pacemaker. We will continue to monitor this. Given her prior history of stroke, we'll have a low threshold for initiating anticoagulation

## 2011-08-07 NOTE — Progress Notes (Signed)
  HPI  [name] is a 78 y.o. female  Seen in followup with a history of complete heart block status post pacemaker implantation 2006.   Her related medical history is notable for hypertension prior stroke x2 and dyslipidemia. She has no prior history of atrial fibrillation.  embolic risk factors include hypertension tender prior stroke age-x1   She has no daytime somnolence and doesn't sleep restfully. She does not know if she snores.  The patient denies chest pain, shortness of breath, nocturnal dyspnea, orthopnea or peripheral edema.  There have been no palpitations, lightheadedness or syncope.   She told the story of her family today. She lost her daughter about 10 years ago. She raised her son's children; a daughter is doing well, and grandson/son is in prison.  Past Medical History  Diagnosis Date  . Complete heart block   . History of hypertension   . History of stroke   . Hypokalemia   . Hyperlipidemia   . Hyperhomocysteinemia     Past Surgical History  Procedure Date  . Pacemaker insertion     Medtronic Enpulse dual-chamber pacemaker  . Cardiac catheterization     Current Outpatient Prescriptions  Medication Sig Dispense Refill  . amLODipine-benazepril (LOTREL) 5-20 MG per capsule Take 1 capsule by mouth daily.  30 capsule  5  . aspirin 81 MG tablet Take 81 mg by mouth daily.      . clopidogrel (PLAVIX) 75 MG tablet Take 1 tablet (75 mg total) by mouth daily.  32 tablet  5  . omeprazole (PRILOSEC) 20 MG capsule Take 20 mg by mouth daily.      . simvastatin (ZOCOR) 40 MG tablet Take 1 tablet (40 mg total) by mouth at bedtime.  30 tablet  1    No Known Allergies  Review of Systems negative except from HPI and PMH  Physical Exam BP 160/98  Pulse 73  Ht 5\' 2"  (1.575 m)  Wt 208 lb 6.4 oz (94.53 kg)  BMI 38.12 kg/m2 Well developed and well nourished in no acute distress HENT normal E scleral and icterus clear Neck Supple JVP flat; carotids brisk and  full Clear to ausculation Regular rate and rhythm, no murmurs gallops or rub Soft with active bowel sounds No clubbing cyanosis 1-2+ Edema Alert and oriented, grossly normal motor and sensory function Skin Warm and Dry   Assessment and  Plan At that AR the cath was 2 over like

## 2011-08-07 NOTE — Patient Instructions (Signed)
Your physician has recommended you make the following change in your medication: Stop Amlodipine/Benazepril.  Start Lisinopril/HCT 20/12.5mg  1 tablet daily and start Labetalol 200mg  1 tablet twice daily.  Remote monitoring is used to monitor your Pacemaker of ICD from home. This monitoring reduces the number of office visits required to check your device to one time per year. It allows [place] to keep an eye on the functioning of your device to ensure it is working properly. You are scheduled for a device check from home on November 08, 2011. You may send your transmission at any time that day. If you have a wireless device, the transmission will be sent automatically. After your physician reviews your transmission, you will receive a postcard with your next transmission date.   Your physician wants you to follow-up in: 1 year with Dr Logan Bores will receive a reminder letter in the mail two months in advance. If you don't receive a letter, please call our office to schedule the follow-up appointment.  Follow up with Dr Nathanial Rancher in 2 weeks.

## 2011-09-06 ENCOUNTER — Encounter: Payer: Self-pay | Admitting: Cardiology

## 2011-11-08 ENCOUNTER — Encounter: Payer: PRIVATE HEALTH INSURANCE | Admitting: *Deleted

## 2011-11-16 ENCOUNTER — Encounter: Payer: Self-pay | Admitting: *Deleted

## 2011-12-01 ENCOUNTER — Other Ambulatory Visit: Payer: Self-pay | Admitting: Internal Medicine

## 2011-12-03 NOTE — Telephone Encounter (Signed)
Fax Received. Refill Completed. Merridy Pascoe Chowoe (R.M.A)   

## 2011-12-06 ENCOUNTER — Telehealth: Payer: Self-pay | Admitting: Internal Medicine

## 2011-12-06 DIAGNOSIS — I4891 Unspecified atrial fibrillation: Secondary | ICD-10-CM

## 2011-12-06 DIAGNOSIS — I442 Atrioventricular block, complete: Secondary | ICD-10-CM

## 2011-12-06 DIAGNOSIS — I119 Hypertensive heart disease without heart failure: Secondary | ICD-10-CM

## 2011-12-06 DIAGNOSIS — R400 Somnolence: Secondary | ICD-10-CM

## 2011-12-06 DIAGNOSIS — Z950 Presence of cardiac pacemaker: Secondary | ICD-10-CM

## 2011-12-06 NOTE — Telephone Encounter (Signed)
New Problem:    Called needing a refill of the patient's lisinopril-hydrochlorothiazide (PRINZIDE,ZESTORETIC) 20-12.5 MG per tablet.  Please call back.

## 2011-12-07 MED ORDER — LISINOPRIL-HYDROCHLOROTHIAZIDE 20-12.5 MG PO TABS: 1.0000 | ORAL_TABLET | Freq: Every day | ORAL | Status: DC

## 2011-12-07 NOTE — Telephone Encounter (Signed)
Fax Received. Refill Completed. [name] (R.M.A)   

## 2011-12-14 ENCOUNTER — Other Ambulatory Visit: Payer: Self-pay | Admitting: Cardiology

## 2012-01-11 ENCOUNTER — Telehealth: Payer: Self-pay | Admitting: Internal Medicine

## 2012-01-11 NOTE — Telephone Encounter (Signed)
01-11-12 lmm @ 457pm with pt's sister to redo missed remote/mt

## 2012-02-20 ENCOUNTER — Other Ambulatory Visit: Payer: Self-pay | Admitting: Cardiology

## 2012-05-01 ENCOUNTER — Other Ambulatory Visit: Payer: Self-pay

## 2012-05-01 MED ORDER — SIMVASTATIN 40 MG PO TABS: 40.0000 mg | ORAL_TABLET | Freq: Every day | ORAL | Status: DC

## 2012-05-02 ENCOUNTER — Encounter: Payer: Self-pay | Admitting: *Deleted

## 2012-06-07 ENCOUNTER — Other Ambulatory Visit: Payer: Self-pay | Admitting: Internal Medicine

## 2012-06-16 ENCOUNTER — Other Ambulatory Visit: Payer: Self-pay | Admitting: Internal Medicine

## 2012-07-07 ENCOUNTER — Other Ambulatory Visit: Payer: Self-pay

## 2012-07-07 MED ORDER — SIMVASTATIN 40 MG PO TABS: 40.0000 mg | ORAL_TABLET | Freq: Every day | ORAL | Status: DC

## 2012-07-14 ENCOUNTER — Other Ambulatory Visit: Payer: Self-pay | Admitting: *Deleted

## 2012-07-14 MED ORDER — LABETALOL HCL 200 MG PO TABS: 200.0000 mg | ORAL_TABLET | Freq: Two times a day (BID) | ORAL | Status: DC

## 2012-07-17 ENCOUNTER — Encounter: Payer: Self-pay | Admitting: Cardiology

## 2012-07-17 ENCOUNTER — Ambulatory Visit (INDEPENDENT_AMBULATORY_CARE_PROVIDER_SITE_OTHER): Payer: Medicare HMO | Admitting: Cardiology

## 2012-07-17 VITALS — BP 128/82 | HR 73 | Ht 62.0 in | Wt 203.8 lb

## 2012-07-17 DIAGNOSIS — I442 Atrioventricular block, complete: Secondary | ICD-10-CM

## 2012-07-17 NOTE — Progress Notes (Signed)
   [name] Date of Birth: 03-12-1934 Medical Record #[id number]  History of Present Illness: [name] is seen today for followup.  She has a history of complete heart block and is status post pacemaker implant in May of 2006 with a Medtronic impulse generator. She is scheduled for followup pacemaker check next month with Dr. Graciela Husbands. She denies any cardiac complaints. She has no other cardiac history.  Current Outpatient Prescriptions on File Prior to Visit  Medication Sig Dispense Refill  . aspirin 81 MG tablet Take 81 mg by mouth daily.      [name] labetalol (NORMODYNE) 200 MG tablet Take 1 tablet (200 mg total) by mouth 2 (two) times daily.  60 tablet  4  . lisinopril-hydrochlorothiazide (PRINZIDE,ZESTORETIC) 20-12.5 MG per tablet TAKE 1 TABLET BY MOUTH EVERY DAY  30 tablet  5  . omeprazole (PRILOSEC) 20 MG capsule Take 20 mg by mouth daily.      . simvastatin (ZOCOR) 40 MG tablet Take 1 tablet (40 mg total) by mouth at bedtime.  30 tablet  1  . clopidogrel (PLAVIX) 75 MG tablet Take 1 tablet (75 mg total) by mouth daily.  32 tablet  5   No current facility-administered medications on file prior to visit.    No Known Allergies  Past Medical History  Diagnosis Date  . Complete heart block   . History of hypertension   . History of stroke   . Hypokalemia   . Hyperlipidemia   . Hyperhomocysteinemia     Past Surgical History  Procedure Laterality Date  . Pacemaker insertion      Medtronic Enpulse dual-chamber pacemaker  . Cardiac catheterization      History  Smoking status  . Former Smoker  . Quit date: 07/25/2006  Smokeless tobacco  . Not on file    History  Alcohol Use: Not on file    Family History  Problem Relation Age of Onset  . Hypertension Mother     Review of Systems: As noted in history of present illness.  All other systems were reviewed and are negative.  Physical Exam: BP 128/82  Pulse 73  Ht 5\' 2"  (1.575 m)  Wt 203 lb 12.8 oz (92.443  kg)  BMI 37.27 kg/m2  SpO2 98% She is an obese black female in no acute distress. Her HEENT exam is unremarkable.  Neck is without JVD or bruits. Lungs are clear. Cardiac exam reveals a regular rate and rhythm without gallop, murmur, or click. Her pacemaker site in her left upper chest is normal. Abdomen is obese. She has no edema. Pedal pulses are palpable. She is alert and oriented x3. Cranial nerves II through XII are intact. Skin is warm and dry. LABORATORY DATA: ECG shows AV pacing with a rate of 74 beats per minute.  Assessment / Plan:  1. Complete heart block status post DDD pacemaker. Patient is to followup with the pacemaker clinic next month. In the absence of other cardiac disease patient will be followed by Dr. Graciela Husbands from now on. I see no reason for her to follow up with me. 2. Hypertension, well controlled.

## 2012-07-17 NOTE — Patient Instructions (Signed)
Follow up with Dr. Graciela Husbands in April.

## 2012-08-14 ENCOUNTER — Encounter: Payer: Self-pay | Admitting: Internal Medicine

## 2012-08-14 ENCOUNTER — Encounter: Payer: Self-pay | Admitting: Cardiology

## 2012-08-14 ENCOUNTER — Ambulatory Visit (INDEPENDENT_AMBULATORY_CARE_PROVIDER_SITE_OTHER): Payer: Medicare HMO | Admitting: Cardiology

## 2012-08-14 VITALS — BP 104/64 | HR 68 | Ht 62.0 in | Wt 203.0 lb

## 2012-08-14 DIAGNOSIS — I10 Essential (primary) hypertension: Secondary | ICD-10-CM

## 2012-08-14 DIAGNOSIS — Z950 Presence of cardiac pacemaker: Secondary | ICD-10-CM

## 2012-08-14 DIAGNOSIS — I4891 Unspecified atrial fibrillation: Secondary | ICD-10-CM

## 2012-08-14 DIAGNOSIS — I442 Atrioventricular block, complete: Secondary | ICD-10-CM

## 2012-08-14 LAB — PACEMAKER DEVICE OBSERVATION
AL AMPLITUDE: 4 mv
ATRIAL PACING PM: 25.7
BAMS-0001: 175 {beats}/min
VENTRICULAR PACING PM: 100

## 2012-08-14 NOTE — Patient Instructions (Addendum)
Your physician recommends that you schedule a follow-up appointment in: 6 months with Device Clinic  

## 2012-08-14 NOTE — Progress Notes (Signed)
ELECTROPHYSIOLOGY OFFICE NOTE  Patient ID: [name] MRN: [id number], DOB/AGE: March 01, 1934   Date of Visit: 08/14/2012  Primary Physician: Ailene Ravel, MD Primary Cardiologist / EP: [place], MD / Graciela Husbands, MD Reason for Visit: EP/device follow-up  History of Present Illness  [name] is a pleasant 79 year old woman with CHB s/p PPM implant, HTN and prior CVA who presents today for routine electrophysiology followup. Since last being seen in our clinic, she reports she is doing well. Today, she denies chest pain or shortness of breath. She denies palpitations, dizziness, near syncope or syncope. She denies LE swelling, orthopnea, PND or recent weight gain. [name] reports that she is compliant and tolerating medications without difficulty.  Past Medical History Past Medical History  Diagnosis Date  . Complete heart block   . History of hypertension   . History of stroke   . Hypokalemia   . Hyperlipidemia   . Hyperhomocysteinemia     Past Surgical History Past Surgical History  Procedure Laterality Date  . Pacemaker insertion      Medtronic Enpulse dual-chamber pacemaker  . Cardiac catheterization      Allergies/Intolerances No Known Allergies  Current Home Medications Current Outpatient Prescriptions  Medication Sig Dispense Refill  . aspirin 81 MG tablet Take 81 mg by mouth daily.      . clopidogrel (PLAVIX) 75 MG tablet Take 1 tablet (75 mg total) by mouth daily.  32 tablet  5  . labetalol (NORMODYNE) 200 MG tablet Take 1 tablet (200 mg total) by mouth 2 (two) times daily.  60 tablet  4  . lisinopril-hydrochlorothiazide (PRINZIDE,ZESTORETIC) 20-12.5 MG per tablet TAKE 1 TABLET BY MOUTH EVERY DAY  30 tablet  5  . omeprazole (PRILOSEC) 20 MG capsule Take 20 mg by mouth daily.      . simvastatin (ZOCOR) 40 MG tablet Take 1 tablet (40 mg total) by mouth at bedtime.  30 tablet  1   No current facility-administered medications for this visit.   Social  History Social History  . Marital Status: Single   Social History Main Topics  . Smoking status: Former Smoker    Quit date: 07/25/2006  . Smokeless tobacco: No  . Alcohol Use: No  . Drug Use: No   Review of Systems General: No chills, fever, night sweats or weight changes Cardiovascular: No chest pain, dyspnea on exertion, edema, orthopnea, palpitations, paroxysmal nocturnal dyspnea Dermatological: No rash, lesions or masses Respiratory: No cough, dyspnea Urologic: No hematuria, dysuria Abdominal: No nausea, vomiting, diarrhea, bright red blood per rectum, melena, or hematemesis Neurologic: No visual changes, weakness, changes in mental status All other systems reviewed and are otherwise negative except as noted above.  Physical Exam Blood pressure 104/64, pulse 68, height 5\' 2"  (1.575 m), weight 203 lb (92.08 kg), SpO2 98.00%.  General: Well developed, well appearing 79 year old female in no acute distress. HEENT: Normocephalic, atraumatic. EOMs intact. Sclera nonicteric. Oropharynx clear.  Neck: Supple. No JVD. Lungs: Respirations regular and unlabored, CTA bilaterally. No wheezes, rales or rhonchi. Heart: RRR. S1, S2 present. No murmurs, rub, S3 or S4. Abdomen: Soft, non-distended.  Extremities: No clubbing, cyanosis or edema. DP/PT/Radials 2+ and equal bilaterally. Psych: Normal affect. Neuro: Alert and oriented X 3. Moves all extremities spontaneously.   Diagnostics Device interrogation today - Normal device function. Thresholds, sensing, impedances consistent with previous measurements. Device programmed to maximize longevity. 47 mode switches, <0.1% of the time, 1 AHR episode 48 seconds in duration, EGM consistent with  an atrial tachycardia. No high ventricular rates noted. Device programmed at appropriate safety margins. Histogram distribution appropriate for patient activity level. Estimated longevity 21 months.  Assessment and Plan 1. CHB s/p PPM implant Normal device  function No programming changes made [name] prefers office visit device follow-up  Return to clinic for device check in 6 months 2. HTN Stable Continue current regimen  Signed, Rick Duff, PA-C 08/14/2012, 2:27 PM

## 2012-09-11 ENCOUNTER — Other Ambulatory Visit: Payer: Self-pay

## 2012-09-11 MED ORDER — SIMVASTATIN 40 MG PO TABS: 40.0000 mg | ORAL_TABLET | Freq: Every day | ORAL | Status: DC

## 2012-11-06 ENCOUNTER — Encounter: Payer: Self-pay | Admitting: Cardiology

## 2012-11-17 ENCOUNTER — Other Ambulatory Visit: Payer: Self-pay | Admitting: Cardiology

## 2012-12-13 ENCOUNTER — Other Ambulatory Visit: Payer: Self-pay | Admitting: Internal Medicine

## 2013-02-18 ENCOUNTER — Ambulatory Visit (INDEPENDENT_AMBULATORY_CARE_PROVIDER_SITE_OTHER): Payer: 59 | Admitting: *Deleted

## 2013-02-18 DIAGNOSIS — I442 Atrioventricular block, complete: Secondary | ICD-10-CM

## 2013-02-18 DIAGNOSIS — Z950 Presence of cardiac pacemaker: Secondary | ICD-10-CM

## 2013-02-18 LAB — PACEMAKER DEVICE OBSERVATION
AL AMPLITUDE: 2.8 mv
AL IMPEDENCE PM: 516 Ohm
ATRIAL PACING PM: 24
BATTERY VOLTAGE: 2.66 V
RV LEAD IMPEDENCE PM: 502 Ohm
VENTRICULAR PACING PM: 100

## 2013-02-18 NOTE — Progress Notes (Signed)
Device check in clinic, all functions normal, no changes made, full details in PaceArt. Battery longevity 12 mo (<2-40months range)  ROV w/ device clinic 05/25/13 for battery check only, ROV w/ Dr. Graciela Husbands in 72mo.

## 2013-03-05 ENCOUNTER — Encounter: Payer: Self-pay | Admitting: Internal Medicine

## 2013-03-23 ENCOUNTER — Other Ambulatory Visit: Payer: Self-pay | Admitting: Internal Medicine

## 2013-05-08 ENCOUNTER — Encounter: Payer: Self-pay | Admitting: Cardiology

## 2013-05-11 ENCOUNTER — Other Ambulatory Visit: Payer: Self-pay | Admitting: Internal Medicine

## 2013-05-25 ENCOUNTER — Ambulatory Visit (INDEPENDENT_AMBULATORY_CARE_PROVIDER_SITE_OTHER): Payer: Medicare Other | Admitting: *Deleted

## 2013-05-25 DIAGNOSIS — Z45010 Encounter for checking and testing of cardiac pacemaker pulse generator [battery]: Secondary | ICD-10-CM

## 2013-05-25 DIAGNOSIS — Z45018 Encounter for adjustment and management of other part of cardiac pacemaker: Secondary | ICD-10-CM

## 2013-05-25 LAB — MDC_IDC_ENUM_SESS_TYPE_INCLINIC
Battery Voltage: 2.6 V
Brady Statistic AP VP Percent: 15 %
Brady Statistic AP VS Percent: 0 %
Brady Statistic AS VP Percent: 85 %
Brady Statistic AS VS Percent: 0 %
Date Time Interrogation Session: 20150126092020
Lead Channel Impedance Value: 492 Ohm
Lead Channel Impedance Value: 555 Ohm
Lead Channel Setting Pacing Amplitude: 2 V
Lead Channel Setting Pacing Amplitude: 2.5 V
Lead Channel Setting Pacing Pulse Width: 0.4 ms
Lead Channel Setting Sensing Sensitivity: 2.8 mV
MDC IDC MSMT BATTERY IMPEDANCE: 5074 Ohm
MDC IDC MSMT BATTERY REMAINING LONGEVITY: 4 mo

## 2013-05-25 NOTE — Progress Notes (Signed)
Battery at 2.60V, estimated longevity 4 months.  ROV w/ device clinic 07/23/13 @ 9:00 for next battery. ROV w/ Dr. Caryl Comes 08/18/13 @ 2:15 for annual check.

## 2013-06-03 ENCOUNTER — Encounter: Payer: Self-pay | Admitting: Internal Medicine

## 2013-06-10 ENCOUNTER — Other Ambulatory Visit: Payer: Self-pay | Admitting: Internal Medicine

## 2013-06-12 ENCOUNTER — Ambulatory Visit: Payer: Self-pay | Admitting: Internal Medicine

## 2013-07-16 ENCOUNTER — Other Ambulatory Visit: Payer: Self-pay | Admitting: Internal Medicine

## 2013-07-23 ENCOUNTER — Ambulatory Visit (INDEPENDENT_AMBULATORY_CARE_PROVIDER_SITE_OTHER): Payer: Medicare Other | Admitting: *Deleted

## 2013-07-23 ENCOUNTER — Other Ambulatory Visit (INDEPENDENT_AMBULATORY_CARE_PROVIDER_SITE_OTHER): Payer: PRIVATE HEALTH INSURANCE

## 2013-07-23 ENCOUNTER — Other Ambulatory Visit: Payer: Self-pay | Admitting: *Deleted

## 2013-07-23 ENCOUNTER — Encounter: Payer: Self-pay | Admitting: *Deleted

## 2013-07-23 ENCOUNTER — Telehealth: Payer: Self-pay | Admitting: *Deleted

## 2013-07-23 DIAGNOSIS — I442 Atrioventricular block, complete: Secondary | ICD-10-CM

## 2013-07-23 DIAGNOSIS — Z45018 Encounter for adjustment and management of other part of cardiac pacemaker: Secondary | ICD-10-CM

## 2013-07-23 DIAGNOSIS — Z01812 Encounter for preprocedural laboratory examination: Secondary | ICD-10-CM

## 2013-07-23 DIAGNOSIS — Z45010 Encounter for checking and testing of cardiac pacemaker pulse generator [battery]: Secondary | ICD-10-CM

## 2013-07-23 LAB — MDC_IDC_ENUM_SESS_TYPE_INCLINIC
Battery Voltage: 2.59 V
Brady Statistic RV Percent Paced: 100 %
Lead Channel Impedance Value: 514 Ohm
Lead Channel Setting Pacing Amplitude: 2.5 V
Lead Channel Setting Pacing Pulse Width: 0.4 ms
Lead Channel Setting Sensing Sensitivity: 2.8 mV
MDC IDC SET LEADCHNL RA PACING AMPLITUDE: 2 V

## 2013-07-23 LAB — BASIC METABOLIC PANEL
BUN: 18 mg/dL (ref 6–23)
CALCIUM: 9.5 mg/dL (ref 8.4–10.5)
CHLORIDE: 104 meq/L (ref 96–112)
CO2: 26 mEq/L (ref 19–32)
Creatinine, Ser: 1 mg/dL (ref 0.4–1.2)
GFR: 66.67 mL/min (ref >60.00)
Glucose, Bld: 95 mg/dL (ref 70–99)
Potassium: 4 mEq/L (ref 3.5–5.1)
SODIUM: 139 meq/L (ref 135–145)

## 2013-07-23 LAB — CBC WITH DIFFERENTIAL/PLATELET
BASOS PCT: 0.6 % (ref 0.0–3.0)
Basophils Absolute: 0 10*3/uL (ref 0.0–0.1)
EOS PCT: 3.7 % (ref 0.0–5.0)
Eosinophils Absolute: 0.2 10*3/uL (ref 0.0–0.7)
HCT: 43.3 % (ref 36.0–46.0)
HEMOGLOBIN: 14.3 g/dL (ref 12.0–15.0)
LYMPHS PCT: 37.8 % (ref 12.0–46.0)
Lymphs Abs: 2 10*3/uL (ref 0.7–4.0)
MCHC: 32.9 g/dL (ref 30.0–36.0)
MCV: 82.4 fl (ref 78.0–100.0)
Monocytes Absolute: 0.4 10*3/uL (ref 0.1–1.0)
Monocytes Relative: 8.4 % (ref 3.0–12.0)
Neutro Abs: 2.6 10*3/uL (ref 1.4–7.7)
Neutrophils Relative %: 49.5 % (ref 43.0–77.0)
Platelets: 162 10*3/uL (ref 150.0–400.0)
RBC: 5.26 Mil/uL — AB (ref 3.87–5.11)
RDW: 15.1 % — ABNORMAL HIGH (ref 11.5–14.6)
WBC: 5.2 10*3/uL (ref 4.5–10.5)

## 2013-07-23 NOTE — Telephone Encounter (Signed)
Pt came into device clinic this morning - pt device at [facility]. Scheduled for Monday 3/30. Pre procedure labs today in office. Procedure instructions reviewed with the patient and instruction sheet given. Pt verbalized understanding.

## 2013-07-23 NOTE — Progress Notes (Signed)
Interrogation only for battery data.  Device @ [facility] since 05/29/13 and has reverted to VVI 65.  Patient to be scheduled for change out 07/27/13.

## 2013-07-24 ENCOUNTER — Encounter: Payer: Self-pay | Admitting: Internal Medicine

## 2013-07-24 ENCOUNTER — Telehealth: Payer: Self-pay | Admitting: *Deleted

## 2013-07-24 ENCOUNTER — Encounter (HOSPITAL_COMMUNITY): Payer: Self-pay

## 2013-07-24 ENCOUNTER — Other Ambulatory Visit: Payer: Self-pay | Admitting: Internal Medicine

## 2013-07-24 DIAGNOSIS — I442 Atrioventricular block, complete: Secondary | ICD-10-CM

## 2013-07-24 NOTE — Telephone Encounter (Signed)
Informed pt that she may have regular breakfast Monday morning, per Dr. Caryl Comes.  Scheduled wound check for 4/13 at 11 am.  Patient verbalized understanding and agreeable to plan.

## 2013-07-24 NOTE — Telephone Encounter (Signed)
Called to inform pt that she may have breakfast Monday morning, before procedure that afternoon.  Also need to schedule wound check for 4/13

## 2013-07-26 MED ORDER — CEFAZOLIN SODIUM-DEXTROSE 2-3 GM-% IV SOLR: 2.0000 g | INTRAVENOUS | Status: AC

## 2013-07-26 MED ORDER — SODIUM CHLORIDE 0.9 % IR SOLN
80.0000 mg | Status: AC
  Filled 2013-07-26: qty 2

## 2013-07-26 MED ORDER — CHLORHEXIDINE GLUCONATE 4 % EX LIQD
60.0000 mL | Freq: Once | CUTANEOUS | Status: DC
  Filled 2013-07-26: qty 60

## 2013-07-27 ENCOUNTER — Telehealth: Payer: Self-pay | Admitting: Cardiology

## 2013-07-27 NOTE — Telephone Encounter (Signed)
[name] was scheduled for generator change today with Dr. Caryl Comes. However, due to scheduling conflicts and an emergency case, her procedure has been rescheduled for Thursday 07/30/2013 at 7:30 AM with Dr. Caryl Comes. I spoke with her via phone and apologized for any inconvenience. I explained that she will need to be NPO after midnight on Wed and to arrive at [facility] Short Stay at 5:30 AM on Thursday. She expressed verbal understanding and agrees with plan.

## 2013-07-29 MED ORDER — SODIUM CHLORIDE 0.9 % IV SOLN
INTRAVENOUS | Status: DC
  Administered 2013-07-30: 06:00:00 via INTRAVENOUS

## 2013-07-30 ENCOUNTER — Ambulatory Visit (HOSPITAL_COMMUNITY)
Admission: RE | Admit: 2013-07-30 | Discharge: 2013-07-30 | Disposition: A | Discharge status: Home / Self Care | Payer: PRIVATE HEALTH INSURANCE | Source: Ambulatory Visit | Attending: Internal Medicine | Admitting: Internal Medicine

## 2013-07-30 ENCOUNTER — Encounter (HOSPITAL_COMMUNITY)
Admission: RE | Disposition: A | Discharge status: Home / Self Care | Payer: Self-pay | Source: Ambulatory Visit | Attending: Internal Medicine

## 2013-07-30 DIAGNOSIS — Z87891 Personal history of nicotine dependence: Secondary | ICD-10-CM | POA: Insufficient documentation

## 2013-07-30 DIAGNOSIS — Z45018 Encounter for adjustment and management of other part of cardiac pacemaker: Secondary | ICD-10-CM | POA: Insufficient documentation

## 2013-07-30 DIAGNOSIS — Z7902 Long term (current) use of antithrombotics/antiplatelets: Secondary | ICD-10-CM | POA: Insufficient documentation

## 2013-07-30 DIAGNOSIS — Z7982 Long term (current) use of aspirin: Secondary | ICD-10-CM | POA: Insufficient documentation

## 2013-07-30 DIAGNOSIS — I442 Atrioventricular block, complete: Secondary | ICD-10-CM

## 2013-07-30 DIAGNOSIS — Z8673 Personal history of transient ischemic attack (TIA), and cerebral infarction without residual deficits: Secondary | ICD-10-CM | POA: Insufficient documentation

## 2013-07-30 DIAGNOSIS — I10 Essential (primary) hypertension: Secondary | ICD-10-CM | POA: Insufficient documentation

## 2013-07-30 DIAGNOSIS — E785 Hyperlipidemia, unspecified: Secondary | ICD-10-CM | POA: Insufficient documentation

## 2013-07-30 HISTORY — PX: PERMANENT PACEMAKER GENERATOR CHANGE: SHX6022

## 2013-07-30 LAB — POTASSIUM: Potassium: 4 mEq/L (ref 3.7–5.3)

## 2013-07-30 LAB — SURGICAL PCR SCREEN
MRSA, PCR: NEGATIVE
Staphylococcus aureus: NEGATIVE

## 2013-07-30 SURGERY — PERMANENT PACEMAKER GENERATOR CHANGE
Anesthesia: LOCAL

## 2013-07-30 MED ORDER — SODIUM CHLORIDE 0.9 % IR SOLN
80.0000 mg | Status: DC
  Filled 2013-07-30: qty 2

## 2013-07-30 MED ORDER — ACETAMINOPHEN 325 MG PO TABS
325.0000 mg | ORAL_TABLET | ORAL | Status: DC | PRN
  Filled 2013-07-30: qty 2

## 2013-07-30 MED ORDER — ONDANSETRON HCL 4 MG/2ML IJ SOLN: 4.0000 mg | Freq: Four times a day (QID) | INTRAMUSCULAR | Status: DC | PRN

## 2013-07-30 MED ORDER — LIDOCAINE HCL (PF) 1 % IJ SOLN
INTRAMUSCULAR | Status: AC
  Filled 2013-07-30: qty 30

## 2013-07-30 MED ORDER — LIDOCAINE HCL (PF) 1 % IJ SOLN
INTRAMUSCULAR | Status: AC
  Filled 2013-07-30: qty 60

## 2013-07-30 MED ORDER — SODIUM CHLORIDE 0.9 % IV SOLN: INTRAVENOUS | Status: DC

## 2013-07-30 MED ORDER — MUPIROCIN 2 % EX OINT
TOPICAL_OINTMENT | Freq: Two times a day (BID) | CUTANEOUS | Status: DC
  Administered 2013-07-30: 1 via NASAL
  Filled 2013-07-30 (×2): qty 22

## 2013-07-30 MED ORDER — CEFAZOLIN SODIUM-DEXTROSE 2-3 GM-% IV SOLR: 2.0000 g | INTRAVENOUS | Status: DC

## 2013-07-30 MED ORDER — SODIUM CHLORIDE 0.9 % IV SOLN
INTRAVENOUS | Status: AC
Start: 2013-07-30 — End: 2013-07-30

## 2013-07-30 MED ORDER — MIDAZOLAM HCL 5 MG/5ML IJ SOLN
INTRAMUSCULAR | Status: AC
  Filled 2013-07-30: qty 5

## 2013-07-30 MED ORDER — CEFAZOLIN SODIUM-DEXTROSE 2-3 GM-% IV SOLR
INTRAVENOUS | Status: AC
  Filled 2013-07-30: qty 50

## 2013-07-30 MED ORDER — FENTANYL CITRATE 0.05 MG/ML IJ SOLN
INTRAMUSCULAR | Status: AC
  Filled 2013-07-30: qty 2

## 2013-07-30 NOTE — Discharge Instructions (Signed)
° °  Supplemental Discharge Instructions following  Pacemaker Generator Change  WOUND CARE   Keep the wound area clean and dry.  You may shower but no soaking in tub bath, swimming pool or hot tub for 10-14 days until wound completely healed.    The Dermabond (glue) on your wound will fall off on its own; do not pull it off.  No bandage is needed on the site.  DO NOT apply any creams, oils, or ointments to the wound area.   If you notice any drainage or discharge from the wound, any swelling or bruising at the site, or you develop a fever > 101? F after you are discharged home, call the office at once.

## 2013-07-30 NOTE — CV Procedure (Signed)
Preoperative diagnosis CHB Pacer  ERI Postoperative diagnosis same/   Procedure: Generator replacement     Following informed consent the patient was brought to the electrophysiology laboratory in place of the fluoroscopic table in the supine position after routine prep and drape lidocaine was infiltrated in the region of the previous incision and carried down to later the device pocket using sharp dissection and electrocautery. The pocket was opened the device was freed up and was explanted.  Interrogation of the previously implanted ventricular lead Medtronic 5076  demonstrated an R wave of N/A  millivolts., and impedance of 502 ohms, and a pacing threshold of 0.8 volts at 0.5 msec.    The previously implanted atrial lead Medtronic 5076 demonstrated a P-wave amplitude of 3.3 milllivolts  and impedance of  499 ohms, and a pacing threshold of 0.5 volts at  @ 0.58milliseconds.  The leads were inspected. The leads were then attached to a Medtronic  pulse generator, serial number [id number].    The pocket was irrigated with antibiotic containing saline solution hemostasis was assured and the leads and the device were placed in the pocket. The wound was then closed in 3 layers in normal fashion.  The patient tolerated the procedure without apparent complication.  Virl Axe

## 2013-07-30 NOTE — Interval H&P Note (Signed)
History and Physical Interval Note:  07/30/2013 7:56 AM  [name]  has presented today for surgery, with the diagnosis of eri  The various methods of treatment have been discussed with the patient and family. After consideration of risks, benefits and other options for treatment, the patient has consented to  Procedure(s): PERMANENT PACEMAKER GENERATOR CHANGE (N/A) as a surgical intervention .  The patient's history has been reviewed, patient examined, no change in status, stable for surgery.  I have reviewed the patient's chart and labs.  Questions were answered to the patient's satisfaction.     Virl Axe

## 2013-07-30 NOTE — H&P (Addendum)
Device dependent  AT ERI  We have reviewed the benefits and risks of generator replacement.  These include but are not limited to lead fracture and infection.  The patient understands, agrees and is willing to proceed.

## 2013-07-30 NOTE — H&P (Signed)
ELECTROPHYSIOLOGY ADMISSION HISTORY & PHYSICAL   Patient ID: [name] MRN: [id number], DOB/AGE: 1933-05-20   Date of Admission: 07/30/2013  Primary Physician: Daiva Eves, MD Primary Cardiologist / EP: [place], MD / Caryl Comes, MD  Reason for Visit: EP/device follow-up   History of Present Illness  [name] is a pleasant 80 year old woman with CHB s/p PPM implant (Medtronic 2006 by Dr. Verlon Setting), HTN and prior CVA who presents today for PPM generator change. Her PPM battery is at [facility]. Today, she reports she is doing well. She denies chest pain or shortness of breath. She denies palpitations, dizziness, near syncope or syncope. She denies LE swelling, orthopnea or PND. [name] reports that she is compliant with medications.  Past Medical History Past Medical History  Diagnosis Date  . Complete heart block   . History of hypertension   . History of stroke   . Hypokalemia   . Hyperlipidemia   . Hyperhomocysteinemia   . Pacemaker-Medtronic 08/07/2011    Past Surgical History Past Surgical History  Procedure Laterality Date  . Pacemaker insertion      Medtronic Enpulse dual-chamber pacemaker  . Cardiac catheterization      Allergies/Intolerances No Known Allergies  Home Medications Medications Prior to Admission  Medication Sig Dispense Refill  . aspirin 81 MG tablet Take 81 mg by mouth daily.      . clopidogrel (PLAVIX) 75 MG tablet Take 75 mg by mouth daily with breakfast.      . labetalol (NORMODYNE) 200 MG tablet Take 200 mg by mouth 2 (two) times daily.      [name] lisinopril-hydrochlorothiazide (PRINZIDE,ZESTORETIC) 20-12.5 MG per tablet Take 1 tablet by mouth daily.      [name] omeprazole (PRILOSEC) 20 MG capsule Take 20 mg by mouth daily.      . simvastatin (ZOCOR) 40 MG tablet Take 40 mg by mouth at bedtime.        Family History Family History  Problem Relation Age of Onset  . Hypertension Mother     Social History History   Social History  . Marital  Status: Single    Spouse Name: N/A    Number of Children: N/A  . Years of Education: N/A   Occupational History  . Not on file.   Social History Main Topics  . Smoking status: Former Smoker    Quit date: 07/25/2006  . Smokeless tobacco: Not on file  . Alcohol Use: Not on file  . Drug Use: Not on file  . Sexual Activity: Not on file   Other Topics Concern  . Not on file   Social History Narrative  . No narrative on file     Review of Systems General: No chills, fever, night sweats or weight changes.  Cardiovascular: No chest pain, dyspnea on exertion, edema, orthopnea, palpitations, paroxysmal nocturnal dyspnea. Dermatological: No rash, lesions or masses. Respiratory: No cough, dyspnea. Urologic: No hematuria, dysuria. Abdominal: No nausea, vomiting, diarrhea, bright red blood per rectum, melena, or hematemesis. Neurologic: No visual changes, weakness, changes in mental status. All other systems reviewed and are otherwise negative except as noted above.  Physical Exam Vitals: Blood pressure 173/83, pulse 65, temperature 97.4 F (36.3 C), temperature source Oral, resp. rate 18, height 5\' 2"  (1.575 m), weight 203 lb (92.08 kg), SpO2 100.00%.  General: Well developed, well appearing 80 y.o. female in no acute distress. HEENT: Normocephalic, atraumatic. EOMs intact. Sclera nonicteric. Oropharynx clear.  Neck: Supple. No JVD. Lungs: Respirations regular and unlabored,  CTA bilaterally. No wheezes, rales or rhonchi. Heart: RRR. S1, S2 present. No murmurs, rub, S3 or S4. Abdomen: Soft, non-tender, non-distended. BS present x 4 quadrants. No hepatosplenomegaly.  Extremities: No clubbing, cyanosis or edema. DP/PT/Radials 2+ and equal bilaterally. Psych: Normal affect. Neuro: Alert and oriented X 3. Moves all extremities spontaneously. Musculoskeletal: No kyphosis. Skin: Intact. Warm and dry. No rashes or petechiae in exposed areas.   Labs Lab Results  Component Value Date    WBC 5.2 07/23/2013   HGB 14.3 07/23/2013   HCT 43.3 07/23/2013   MCV 82.4 07/23/2013   PLT 162.0 07/23/2013    Recent Labs Lab 07/23/13 0950 07/30/13 0607  NA 139  --   K 4.0 Hemolyzed 4.0  CL 104  --   CO2 26  --   BUN 18  --   CREATININE 1.0  --   CALCIUM 9.5  --   GLUCOSE 95  --     Radiology/Studies No results found.  Device interrogation 07/23/2013 in the office - Battery at [facility] since 05/29/2013. Pacer dependent.  Assessment and Plan 1. PPM battery at [facility] 2. CHB s/p PPM implant 3. HTN 4. Dyslipidemia 5. Prior CVA [name] presents with PPM battery at [facility]. Discussed the need for PPM generator change. Reviewed procedure including risks and benefits. Risks include but are not limited to bleeding and/or infection. [name] expressed verbal understanding and agrees with this plan of care.   Signed, Ileene Hutchinson, PA-C 07/30/2013, 7:11 AM

## 2013-08-10 ENCOUNTER — Ambulatory Visit (INDEPENDENT_AMBULATORY_CARE_PROVIDER_SITE_OTHER): Payer: PRIVATE HEALTH INSURANCE | Admitting: *Deleted

## 2013-08-10 DIAGNOSIS — I442 Atrioventricular block, complete: Secondary | ICD-10-CM

## 2013-08-10 LAB — MDC_IDC_ENUM_SESS_TYPE_INCLINIC
Battery Impedance: 100 Ohm
Battery Remaining Longevity: 148 mo
Battery Voltage: 2.8 V
Brady Statistic AP VP Percent: 44 %
Brady Statistic AS VP Percent: 56 %
Lead Channel Impedance Value: 479 Ohm
Lead Channel Impedance Value: 492 Ohm
Lead Channel Pacing Threshold Pulse Width: 0.4 ms
Lead Channel Sensing Intrinsic Amplitude: 2 mV
Lead Channel Setting Pacing Amplitude: 1.5 V
Lead Channel Setting Pacing Amplitude: 2 V
Lead Channel Setting Pacing Pulse Width: 0.4 ms
MDC IDC MSMT LEADCHNL RA PACING THRESHOLD AMPLITUDE: 0.5 V
MDC IDC MSMT LEADCHNL RA PACING THRESHOLD PULSEWIDTH: 0.4 ms
MDC IDC MSMT LEADCHNL RV PACING THRESHOLD AMPLITUDE: 0.5 V
MDC IDC SESS DTM: 20150413125740
MDC IDC SET LEADCHNL RV SENSING SENSITIVITY: 2.8 mV
MDC IDC STAT BRADY AP VS PERCENT: 0 %
MDC IDC STAT BRADY AS VS PERCENT: 0 %

## 2013-08-10 NOTE — Progress Notes (Signed)
Wound check appointment. Steri-strips removed. Wound without redness or edema. Incision edges approximated, wound well healed. Normal device function. Thresholds, sensing, and impedances consistent with implant measurements. Device programmed at 3.5V/auto capture programmed on for extra safety margin until 3 month visit. Histogram distribution appropriate for patient and level of activity. 12 mode switches, 0.2%.  No high ventricular rates noted. Patient educated about wound care, arm mobility, lifting restrictions. ROV in 3 months with implanting physician.

## 2013-08-18 ENCOUNTER — Encounter: Payer: Medicare Other | Admitting: Internal Medicine

## 2013-08-25 ENCOUNTER — Encounter: Payer: Medicare Other | Admitting: Internal Medicine

## 2013-09-04 ENCOUNTER — Encounter: Payer: Self-pay | Admitting: Internal Medicine

## 2013-09-23 ENCOUNTER — Other Ambulatory Visit: Payer: Self-pay | Admitting: Internal Medicine

## 2013-09-24 ENCOUNTER — Encounter: Payer: Self-pay | Admitting: Internal Medicine

## 2013-10-02 ENCOUNTER — Other Ambulatory Visit: Payer: Self-pay | Admitting: Internal Medicine

## 2013-10-20 ENCOUNTER — Other Ambulatory Visit: Payer: Self-pay | Admitting: Internal Medicine

## 2013-11-03 ENCOUNTER — Ambulatory Visit (INDEPENDENT_AMBULATORY_CARE_PROVIDER_SITE_OTHER): Payer: PRIVATE HEALTH INSURANCE | Admitting: Internal Medicine

## 2013-11-03 ENCOUNTER — Encounter: Payer: Medicare Other | Admitting: Cardiology

## 2013-11-03 ENCOUNTER — Encounter: Payer: Self-pay | Admitting: Internal Medicine

## 2013-11-03 VITALS — BP 154/104 | HR 86 | Ht 63.0 in | Wt 202.0 lb

## 2013-11-03 DIAGNOSIS — I4891 Unspecified atrial fibrillation: Secondary | ICD-10-CM

## 2013-11-03 DIAGNOSIS — I442 Atrioventricular block, complete: Secondary | ICD-10-CM

## 2013-11-03 DIAGNOSIS — Z950 Presence of cardiac pacemaker: Secondary | ICD-10-CM

## 2013-11-03 LAB — MDC_IDC_ENUM_SESS_TYPE_INCLINIC
Battery Voltage: 2.79 V
Brady Statistic AP VS Percent: 0 %
Brady Statistic AS VS Percent: 0 %
Date Time Interrogation Session: 20150707174225
Lead Channel Pacing Threshold Amplitude: 0.5 V
Lead Channel Pacing Threshold Amplitude: 0.75 V
Lead Channel Pacing Threshold Pulse Width: 0.4 ms
Lead Channel Setting Pacing Amplitude: 1.5 V
Lead Channel Setting Pacing Pulse Width: 0.4 ms
MDC IDC MSMT BATTERY IMPEDANCE: 100 Ohm
MDC IDC MSMT BATTERY REMAINING LONGEVITY: 147 mo
MDC IDC MSMT LEADCHNL RA IMPEDANCE VALUE: 479 Ohm
MDC IDC MSMT LEADCHNL RA PACING THRESHOLD PULSEWIDTH: 0.4 ms
MDC IDC MSMT LEADCHNL RA SENSING INTR AMPL: 2 mV
MDC IDC MSMT LEADCHNL RV IMPEDANCE VALUE: 493 Ohm
MDC IDC SET LEADCHNL RV PACING AMPLITUDE: 2 V
MDC IDC SET LEADCHNL RV SENSING SENSITIVITY: 2.8 mV
MDC IDC STAT BRADY AP VP PERCENT: 39 %
MDC IDC STAT BRADY AS VP PERCENT: 61 %

## 2013-11-03 NOTE — Patient Instructions (Signed)
Your physician recommends that you continue on your current medications as directed. Please refer to the Current Medication list given to you today.  Remote monitoring is used to monitor your Pacemaker of ICD from home. This monitoring reduces the number of office visits required to check your device to one time per year. It allows [place] to keep an eye on the functioning of your device to ensure it is working properly. You are scheduled for a device check from home on 02/02/14. You may send your transmission at any time that day. If you have a wireless device, the transmission will be sent automatically. After your physician reviews your transmission, you will receive a postcard with your next transmission date.  Your physician wants you to follow-up in: 9 months with Dr. Caryl Comes.  You will receive a reminder letter in the mail two months in advance. If you don't receive a letter, please call our office to schedule the follow-up appointment.

## 2013-11-03 NOTE — Progress Notes (Signed)
skf      Patient Care Team: Leonides Sake, MD as PCP - General (Family Medicine)   HPI  [name] is a 80 y.o. female Seen in followup for  pacemaker implanted remotely for complete heart block. She underwent device generator replacement 4/15.  She is a prior stroke and history of hypertension.  The patient denies chest pain, shortness of breath, nocturnal dyspnea, orthopnea or peripheral edema.  There have been no palpitations, lightheadedness or syncope.   She is not ready stable on her feet however. Past Medical History  Diagnosis Date  . Complete heart block   . History of hypertension   . History of stroke   . Hypokalemia   . Hyperlipidemia   . Hyperhomocysteinemia   . Pacemaker-Medtronic 08/07/2011    Past Surgical History  Procedure Laterality Date  . Pacemaker insertion      Medtronic Enpulse dual-chamber pacemaker  . Cardiac catheterization      Current Outpatient Prescriptions  Medication Sig Dispense Refill  . aspirin 81 MG tablet Take 81 mg by mouth daily.      . clopidogrel (PLAVIX) 75 MG tablet Take 75 mg by mouth daily with breakfast.      . labetalol (NORMODYNE) 200 MG tablet Take 200 mg by mouth 2 (two) times daily.      [name] lisinopril-hydrochlorothiazide (PRINZIDE,ZESTORETIC) 20-12.5 MG per tablet Take 1 tablet by mouth daily.      . simvastatin (ZOCOR) 40 MG tablet TAKE 1 TABLET BY MOUTH AT BEDTIME  30 tablet  1   No current facility-administered medications for this visit.    No Known Allergies  Review of Systems negative except from HPI and PMH  Physical Exam BP 154/104  Pulse 86  Ht 5\' 3"  (1.6 m)  Wt 202 lb (91.627 kg)  BMI 35.79 kg/m2 Well developed and well nourished in no acute distress HENT normal E scleral and icterus clear Neck Supple JVP flat; carotids brisk and full Clear to ausculation Device pocket well healed; without hematoma or erythema.  There is no tethering Regular rate and rhythm, no murmurs gallops or  rub Soft with active bowel sounds No clubbing cyanosis  Edema Alert and oriented, grossly normal motor and sensory function Skin Warm and Dry  ECG demonstrates AV pacing  Assessment and  Plan  Hypertension-poorly controlled  Complete heart block  CVA/  Pacemaker-Medtronic  The patient's device was interrogated.  The information was reviewed. No changes were made in the programming.     The patient's blood pressure is markedly elevated. It is symmetric. She is to see her PCP tomorrow. She says that her blood pressure today is anomalously high; however, on review of our records it has been elevated on last visits.  Heart block is controlled in the context of her pacemaker  We'll continue aspirin/Plavix given her stroke

## 2013-11-23 ENCOUNTER — Other Ambulatory Visit: Payer: Self-pay | Admitting: Internal Medicine

## 2013-12-23 ENCOUNTER — Other Ambulatory Visit: Payer: Self-pay | Admitting: Internal Medicine

## 2014-02-02 ENCOUNTER — Ambulatory Visit (INDEPENDENT_AMBULATORY_CARE_PROVIDER_SITE_OTHER): Payer: PRIVATE HEALTH INSURANCE | Admitting: *Deleted

## 2014-02-02 DIAGNOSIS — I442 Atrioventricular block, complete: Secondary | ICD-10-CM

## 2014-02-03 ENCOUNTER — Telehealth: Payer: Self-pay | Admitting: Internal Medicine

## 2014-02-03 ENCOUNTER — Telehealth: Payer: Self-pay | Admitting: Cardiology

## 2014-02-03 DIAGNOSIS — I442 Atrioventricular block, complete: Secondary | ICD-10-CM

## 2014-02-03 LAB — MDC_IDC_ENUM_SESS_TYPE_REMOTE
Battery Impedance: 100 Ohm
Brady Statistic AP VP Percent: 59 %
Brady Statistic AP VS Percent: 0 %
Brady Statistic AS VP Percent: 41 %
Brady Statistic AS VS Percent: 0 %
Date Time Interrogation Session: 20151007155912
Lead Channel Impedance Value: 512 Ohm
Lead Channel Impedance Value: 522 Ohm
Lead Channel Pacing Threshold Amplitude: 0.5 V
Lead Channel Pacing Threshold Amplitude: 0.875 V
Lead Channel Pacing Threshold Pulse Width: 0.4 ms
Lead Channel Sensing Intrinsic Amplitude: 1.4 mV
Lead Channel Setting Pacing Amplitude: 2 V
Lead Channel Setting Sensing Sensitivity: 2.8 mV
MDC IDC MSMT BATTERY REMAINING LONGEVITY: 147 mo
MDC IDC MSMT BATTERY VOLTAGE: 2.79 V
MDC IDC MSMT LEADCHNL RV PACING THRESHOLD PULSEWIDTH: 0.4 ms
MDC IDC SET LEADCHNL RA PACING AMPLITUDE: 1.5 V
MDC IDC SET LEADCHNL RV PACING PULSEWIDTH: 0.4 ms

## 2014-02-03 NOTE — Telephone Encounter (Signed)
Attempted to confirm remote transmission with pt but no answer and was unable to leave a message.

## 2014-02-03 NOTE — Telephone Encounter (Signed)
Spoke with pt and helped her trouble shoot monitor after several attempts I was unable to help pt and redirected her to call tech services with Medtronic.

## 2014-02-03 NOTE — Progress Notes (Signed)
Remote pacemaker transmission.   

## 2014-02-03 NOTE — Telephone Encounter (Signed)
Follow Up pt returned call from [facility]

## 2014-02-16 ENCOUNTER — Encounter: Payer: Self-pay | Admitting: Cardiology

## 2014-03-02 ENCOUNTER — Other Ambulatory Visit: Payer: Self-pay | Admitting: Internal Medicine

## 2014-03-05 ENCOUNTER — Encounter: Payer: Self-pay | Admitting: Internal Medicine

## 2014-04-08 ENCOUNTER — Encounter (HOSPITAL_COMMUNITY): Payer: Self-pay | Admitting: Internal Medicine

## 2014-05-06 ENCOUNTER — Ambulatory Visit (INDEPENDENT_AMBULATORY_CARE_PROVIDER_SITE_OTHER): Payer: Medicare Other | Admitting: *Deleted

## 2014-05-06 DIAGNOSIS — I442 Atrioventricular block, complete: Secondary | ICD-10-CM

## 2014-05-06 LAB — MDC_IDC_ENUM_SESS_TYPE_REMOTE
Battery Impedance: 100 Ohm
Battery Remaining Longevity: 145 mo
Battery Voltage: 2.79 V
Brady Statistic AP VS Percent: 0 %
Brady Statistic AS VP Percent: 49 %
Lead Channel Impedance Value: 465 Ohm
Lead Channel Impedance Value: 478 Ohm
Lead Channel Pacing Threshold Amplitude: 0.5 V
Lead Channel Pacing Threshold Pulse Width: 0.4 ms
Lead Channel Pacing Threshold Pulse Width: 0.4 ms
Lead Channel Sensing Intrinsic Amplitude: 2.8 mV
Lead Channel Setting Pacing Pulse Width: 0.4 ms
Lead Channel Setting Sensing Sensitivity: 2.8 mV
MDC IDC MSMT LEADCHNL RV PACING THRESHOLD AMPLITUDE: 0.75 V
MDC IDC SESS DTM: 20160107140324
MDC IDC SET LEADCHNL RA PACING AMPLITUDE: 1.5 V
MDC IDC SET LEADCHNL RV PACING AMPLITUDE: 2 V
MDC IDC STAT BRADY AP VP PERCENT: 51 %
MDC IDC STAT BRADY AS VS PERCENT: 0 %

## 2014-05-06 NOTE — Progress Notes (Signed)
Remote pacemaker transmission.   

## 2014-06-29 ENCOUNTER — Other Ambulatory Visit: Payer: Self-pay | Admitting: Internal Medicine

## 2014-06-29 NOTE — Telephone Encounter (Signed)
Ok to refill this? I see at the last ov it was discontinued in error. Please advise. Thanks, MI

## 2014-06-30 NOTE — Telephone Encounter (Signed)
Ok to refill. Thanks 

## 2014-07-08 ENCOUNTER — Encounter: Payer: Self-pay | Admitting: Internal Medicine

## 2014-07-08 ENCOUNTER — Encounter: Payer: Self-pay | Admitting: *Deleted

## 2014-08-05 ENCOUNTER — Other Ambulatory Visit: Payer: Self-pay | Admitting: Internal Medicine

## 2014-08-07 ENCOUNTER — Other Ambulatory Visit: Payer: Self-pay | Admitting: Internal Medicine

## 2014-09-04 ENCOUNTER — Other Ambulatory Visit: Payer: Self-pay | Admitting: Internal Medicine

## 2014-09-05 ENCOUNTER — Other Ambulatory Visit: Payer: Self-pay | Admitting: Internal Medicine

## 2014-09-06 ENCOUNTER — Encounter: Payer: Self-pay | Admitting: Internal Medicine

## 2014-09-06 ENCOUNTER — Ambulatory Visit (INDEPENDENT_AMBULATORY_CARE_PROVIDER_SITE_OTHER): Payer: Medicare Other | Admitting: Internal Medicine

## 2014-09-06 VITALS — BP 120/68 | HR 66 | Ht 63.0 in | Wt 202.0 lb

## 2014-09-06 DIAGNOSIS — Z950 Presence of cardiac pacemaker: Secondary | ICD-10-CM

## 2014-09-06 DIAGNOSIS — Z45018 Encounter for adjustment and management of other part of cardiac pacemaker: Secondary | ICD-10-CM | POA: Diagnosis not present

## 2014-09-06 DIAGNOSIS — I442 Atrioventricular block, complete: Secondary | ICD-10-CM

## 2014-09-06 LAB — CUP PACEART INCLINIC DEVICE CHECK
Battery Impedance: 112 Ohm
Battery Voltage: 2.78 V
Brady Statistic AP VP Percent: 49 %
Brady Statistic AP VS Percent: 0 %
Brady Statistic AS VP Percent: 51 %
Brady Statistic AS VS Percent: 0 %
Date Time Interrogation Session: 20160509120238
Lead Channel Pacing Threshold Amplitude: 0.5 V
Lead Channel Pacing Threshold Pulse Width: 0.4 ms
Lead Channel Sensing Intrinsic Amplitude: 2 mV
Lead Channel Setting Pacing Amplitude: 2 V
Lead Channel Setting Pacing Pulse Width: 0.4 ms
MDC IDC MSMT BATTERY REMAINING LONGEVITY: 141 mo
MDC IDC MSMT LEADCHNL RA IMPEDANCE VALUE: 472 Ohm
MDC IDC MSMT LEADCHNL RA PACING THRESHOLD PULSEWIDTH: 0.4 ms
MDC IDC MSMT LEADCHNL RV IMPEDANCE VALUE: 468 Ohm
MDC IDC MSMT LEADCHNL RV PACING THRESHOLD AMPLITUDE: 1 V
MDC IDC SET LEADCHNL RA PACING AMPLITUDE: 1.5 V
MDC IDC SET LEADCHNL RV SENSING SENSITIVITY: 2.8 mV

## 2014-09-06 NOTE — Patient Instructions (Signed)
Medication Instructions:  Your physician recommends that you continue on your current medications as directed. Please refer to the Current Medication list given to you today.  Labwork: None ordered  Testing/Procedures: None ordered  Follow-Up: Remote monitoring is used to monitor your Pacemaker of ICD from home. This monitoring reduces the number of office visits required to check your device to one time per year. It allows [place] to keep an eye on the functioning of your device to ensure it is working properly. You are scheduled for a device check from home on 12/06/14. You may send your transmission at any time that day. If you have a wireless device, the transmission will be sent automatically. After your physician reviews your transmission, you will receive a postcard with your next transmission date.  Your physician wants you to follow-up in: 1 year with Dr. Caryl Comes.  You will receive a reminder letter in the mail two months in advance. If you don't receive a letter, please call our office to schedule the follow-up appointment.   Thank you for choosing [facility]!!

## 2014-09-06 NOTE — Progress Notes (Signed)
skf      Patient Care Team: Leonides Sake, MD as PCP - General (Family Medicine)   HPI  [name] is a 81 y.o. female Seen in followup for  pacemaker implanted remotely for complete heart block. She underwent device generator replacement 4/15.  She is a prior stroke and history of hypertension.  The patient denies chest pain, shortness of breath, nocturnal dyspnea, orthopnea or peripheral edema.  There have been no palpitations, lightheadedness or syncope.   She is not ready stable on her feet however. Past Medical History  Diagnosis Date  . Complete heart block   . History of hypertension   . History of stroke   . Hypokalemia   . Hyperlipidemia   . Hyperhomocysteinemia   . Pacemaker-Medtronic 08/07/2011    Past Surgical History  Procedure Laterality Date  . Pacemaker insertion      Medtronic Enpulse dual-chamber pacemaker  . Cardiac catheterization    . Permanent pacemaker generator change N/A 07/30/2013    Procedure: PERMANENT PACEMAKER GENERATOR CHANGE;  Surgeon: [name], MD;  Location: [facility] CATH LAB;  Service: Cardiovascular;  Laterality: N/A;    Current Outpatient Prescriptions  Medication Sig Dispense Refill  . aspirin 81 MG tablet Take 81 mg by mouth daily.    . clopidogrel (PLAVIX) 75 MG tablet Take 75 mg by mouth daily with breakfast.    . labetalol (NORMODYNE) 200 MG tablet Take 200 mg by mouth 2 (two) times daily.    [name] lisinopril-hydrochlorothiazide (PRINZIDE,ZESTORETIC) 20-12.5 MG per tablet TAKE 1 TABLET BY MOUTH EVERY DAY (Patient taking differently: TAKE 1/2 TABLET BY MOUTH EVERY DAY) 30 tablet 3  . simvastatin (ZOCOR) 40 MG tablet TAKE 1 TABLET BY MOUTH AT BEDTIME 30 tablet 0   No current facility-administered medications for this visit.    No Known Allergies  Review of Systems negative except from HPI and PMH  Physical Exam BP 120/68 mmHg  Pulse 66  Ht '5\' 3"'$  (1.6 m)  Wt 202 lb (91.627 kg)  BMI 35.79 kg/m2 Well developed and  well nourished in no acute distress HENT normal E scleral and icterus clear Neck Supple JVP flat; carotids brisk and full Clear to ausculation Device pocket well healed; without hematoma or erythema.  There is no tethering Regular rate and rhythm, no murmurs gallops or rub Soft with active bowel sounds No clubbing cyanosis  Edema Alert and oriented, grossly normal motor and sensory function Skin Warm and Dry  ECG demonstrates AV pacing  Assessment and  Plan  Hypertension-well  Controlled  we'll continue labetalol  Complete heart block  Lipids HDL and LDL both less than 40 1/15;   continue simvastatin  CVA no intercurrent AFib   Pacemaker-Medtronic  The patient's device was interrogated.  The information was reviewed. No changes were made in the programming.    Heart block is controlled in the context of her pacemaker  We'll continue aspirin/Plavix given her stroke;  she's had some atrial tachycardia but no evidence of atrial fibrillation through her device

## 2014-09-29 DIAGNOSIS — I10 Essential (primary) hypertension: Secondary | ICD-10-CM | POA: Diagnosis not present

## 2014-09-29 DIAGNOSIS — K219 Gastro-esophageal reflux disease without esophagitis: Secondary | ICD-10-CM | POA: Diagnosis not present

## 2014-09-29 DIAGNOSIS — E782 Mixed hyperlipidemia: Secondary | ICD-10-CM | POA: Diagnosis not present

## 2014-09-30 DIAGNOSIS — I10 Essential (primary) hypertension: Secondary | ICD-10-CM | POA: Diagnosis not present

## 2014-09-30 DIAGNOSIS — E782 Mixed hyperlipidemia: Secondary | ICD-10-CM | POA: Diagnosis not present

## 2014-09-30 DIAGNOSIS — K219 Gastro-esophageal reflux disease without esophagitis: Secondary | ICD-10-CM | POA: Diagnosis not present

## 2014-10-01 DIAGNOSIS — K219 Gastro-esophageal reflux disease without esophagitis: Secondary | ICD-10-CM | POA: Diagnosis not present

## 2014-10-01 DIAGNOSIS — E782 Mixed hyperlipidemia: Secondary | ICD-10-CM | POA: Diagnosis not present

## 2014-10-01 DIAGNOSIS — I10 Essential (primary) hypertension: Secondary | ICD-10-CM | POA: Diagnosis not present

## 2014-10-04 DIAGNOSIS — E782 Mixed hyperlipidemia: Secondary | ICD-10-CM | POA: Diagnosis not present

## 2014-10-04 DIAGNOSIS — I10 Essential (primary) hypertension: Secondary | ICD-10-CM | POA: Diagnosis not present

## 2014-10-04 DIAGNOSIS — K219 Gastro-esophageal reflux disease without esophagitis: Secondary | ICD-10-CM | POA: Diagnosis not present

## 2014-10-05 DIAGNOSIS — E782 Mixed hyperlipidemia: Secondary | ICD-10-CM | POA: Diagnosis not present

## 2014-10-05 DIAGNOSIS — K219 Gastro-esophageal reflux disease without esophagitis: Secondary | ICD-10-CM | POA: Diagnosis not present

## 2014-10-05 DIAGNOSIS — I10 Essential (primary) hypertension: Secondary | ICD-10-CM | POA: Diagnosis not present

## 2014-10-06 DIAGNOSIS — K219 Gastro-esophageal reflux disease without esophagitis: Secondary | ICD-10-CM | POA: Diagnosis not present

## 2014-10-06 DIAGNOSIS — E782 Mixed hyperlipidemia: Secondary | ICD-10-CM | POA: Diagnosis not present

## 2014-10-06 DIAGNOSIS — I10 Essential (primary) hypertension: Secondary | ICD-10-CM | POA: Diagnosis not present

## 2014-10-07 DIAGNOSIS — I10 Essential (primary) hypertension: Secondary | ICD-10-CM | POA: Diagnosis not present

## 2014-10-07 DIAGNOSIS — K219 Gastro-esophageal reflux disease without esophagitis: Secondary | ICD-10-CM | POA: Diagnosis not present

## 2014-10-07 DIAGNOSIS — E782 Mixed hyperlipidemia: Secondary | ICD-10-CM | POA: Diagnosis not present

## 2014-10-08 DIAGNOSIS — E782 Mixed hyperlipidemia: Secondary | ICD-10-CM | POA: Diagnosis not present

## 2014-10-08 DIAGNOSIS — K219 Gastro-esophageal reflux disease without esophagitis: Secondary | ICD-10-CM | POA: Diagnosis not present

## 2014-10-08 DIAGNOSIS — I10 Essential (primary) hypertension: Secondary | ICD-10-CM | POA: Diagnosis not present

## 2014-10-11 DIAGNOSIS — I10 Essential (primary) hypertension: Secondary | ICD-10-CM | POA: Diagnosis not present

## 2014-10-11 DIAGNOSIS — E782 Mixed hyperlipidemia: Secondary | ICD-10-CM | POA: Diagnosis not present

## 2014-10-11 DIAGNOSIS — K219 Gastro-esophageal reflux disease without esophagitis: Secondary | ICD-10-CM | POA: Diagnosis not present

## 2014-10-12 DIAGNOSIS — K219 Gastro-esophageal reflux disease without esophagitis: Secondary | ICD-10-CM | POA: Diagnosis not present

## 2014-10-12 DIAGNOSIS — I10 Essential (primary) hypertension: Secondary | ICD-10-CM | POA: Diagnosis not present

## 2014-10-12 DIAGNOSIS — E782 Mixed hyperlipidemia: Secondary | ICD-10-CM | POA: Diagnosis not present

## 2014-10-13 DIAGNOSIS — I10 Essential (primary) hypertension: Secondary | ICD-10-CM | POA: Diagnosis not present

## 2014-10-13 DIAGNOSIS — K219 Gastro-esophageal reflux disease without esophagitis: Secondary | ICD-10-CM | POA: Diagnosis not present

## 2014-10-13 DIAGNOSIS — E782 Mixed hyperlipidemia: Secondary | ICD-10-CM | POA: Diagnosis not present

## 2014-10-14 DIAGNOSIS — I10 Essential (primary) hypertension: Secondary | ICD-10-CM | POA: Diagnosis not present

## 2014-10-14 DIAGNOSIS — K219 Gastro-esophageal reflux disease without esophagitis: Secondary | ICD-10-CM | POA: Diagnosis not present

## 2014-10-14 DIAGNOSIS — E782 Mixed hyperlipidemia: Secondary | ICD-10-CM | POA: Diagnosis not present

## 2014-10-15 DIAGNOSIS — E782 Mixed hyperlipidemia: Secondary | ICD-10-CM | POA: Diagnosis not present

## 2014-10-15 DIAGNOSIS — I10 Essential (primary) hypertension: Secondary | ICD-10-CM | POA: Diagnosis not present

## 2014-10-15 DIAGNOSIS — K219 Gastro-esophageal reflux disease without esophagitis: Secondary | ICD-10-CM | POA: Diagnosis not present

## 2014-11-15 DIAGNOSIS — Z79899 Other long term (current) drug therapy: Secondary | ICD-10-CM | POA: Diagnosis not present

## 2014-11-15 DIAGNOSIS — R7309 Other abnormal glucose: Secondary | ICD-10-CM | POA: Diagnosis not present

## 2014-11-15 DIAGNOSIS — E780 Pure hypercholesterolemia: Secondary | ICD-10-CM | POA: Diagnosis not present

## 2014-11-23 DIAGNOSIS — E780 Pure hypercholesterolemia: Secondary | ICD-10-CM | POA: Diagnosis not present

## 2014-11-23 DIAGNOSIS — I10 Essential (primary) hypertension: Secondary | ICD-10-CM | POA: Diagnosis not present

## 2014-11-23 DIAGNOSIS — Z1389 Encounter for screening for other disorder: Secondary | ICD-10-CM | POA: Diagnosis not present

## 2014-11-23 DIAGNOSIS — R7309 Other abnormal glucose: Secondary | ICD-10-CM | POA: Diagnosis not present

## 2014-11-23 DIAGNOSIS — Z8673 Personal history of transient ischemic attack (TIA), and cerebral infarction without residual deficits: Secondary | ICD-10-CM | POA: Diagnosis not present

## 2014-11-30 ENCOUNTER — Other Ambulatory Visit: Payer: Self-pay | Admitting: Internal Medicine

## 2014-12-06 ENCOUNTER — Ambulatory Visit (INDEPENDENT_AMBULATORY_CARE_PROVIDER_SITE_OTHER): Payer: Medicare Other | Admitting: *Deleted

## 2014-12-06 ENCOUNTER — Telehealth: Payer: Self-pay | Admitting: Cardiology

## 2014-12-06 DIAGNOSIS — I442 Atrioventricular block, complete: Secondary | ICD-10-CM

## 2014-12-06 NOTE — Telephone Encounter (Signed)
Spoke with pt and reminded pt of remote transmission that is due today. Pt verbalized understanding.   

## 2014-12-06 NOTE — Progress Notes (Signed)
Remote pacemaker transmission.   

## 2014-12-13 LAB — CUP PACEART REMOTE DEVICE CHECK
Battery Impedance: 112 Ohm
Battery Remaining Longevity: 138 mo
Battery Voltage: 2.79 V
Brady Statistic AP VS Percent: 0 %
Date Time Interrogation Session: 20160808161120
Lead Channel Impedance Value: 465 Ohm
Lead Channel Pacing Threshold Pulse Width: 0.4 ms
Lead Channel Pacing Threshold Pulse Width: 0.4 ms
Lead Channel Sensing Intrinsic Amplitude: 1.4 mV
Lead Channel Setting Pacing Amplitude: 1.5 V
Lead Channel Setting Pacing Amplitude: 2.25 V
Lead Channel Setting Sensing Sensitivity: 2.8 mV
MDC IDC MSMT LEADCHNL RA PACING THRESHOLD AMPLITUDE: 0.5 V
MDC IDC MSMT LEADCHNL RV IMPEDANCE VALUE: 475 Ohm
MDC IDC MSMT LEADCHNL RV PACING THRESHOLD AMPLITUDE: 1.125 V
MDC IDC SET LEADCHNL RV PACING PULSEWIDTH: 0.4 ms
MDC IDC STAT BRADY AP VP PERCENT: 56 %
MDC IDC STAT BRADY AS VP PERCENT: 44 %
MDC IDC STAT BRADY AS VS PERCENT: 0 %

## 2014-12-22 ENCOUNTER — Other Ambulatory Visit: Payer: Self-pay | Admitting: Internal Medicine

## 2015-01-07 ENCOUNTER — Encounter: Payer: Self-pay | Admitting: Cardiology

## 2015-01-17 ENCOUNTER — Encounter: Payer: Self-pay | Admitting: Internal Medicine

## 2015-01-31 DIAGNOSIS — H35371 Puckering of macula, right eye: Secondary | ICD-10-CM | POA: Diagnosis not present

## 2015-01-31 DIAGNOSIS — H25011 Cortical age-related cataract, right eye: Secondary | ICD-10-CM | POA: Diagnosis not present

## 2015-01-31 DIAGNOSIS — H2511 Age-related nuclear cataract, right eye: Secondary | ICD-10-CM | POA: Diagnosis not present

## 2015-01-31 DIAGNOSIS — H02839 Dermatochalasis of unspecified eye, unspecified eyelid: Secondary | ICD-10-CM | POA: Diagnosis not present

## 2015-01-31 DIAGNOSIS — H2512 Age-related nuclear cataract, left eye: Secondary | ICD-10-CM | POA: Diagnosis not present

## 2015-03-07 ENCOUNTER — Telehealth: Payer: Self-pay | Admitting: Cardiology

## 2015-03-07 ENCOUNTER — Ambulatory Visit (INDEPENDENT_AMBULATORY_CARE_PROVIDER_SITE_OTHER): Payer: Medicare Other | Admitting: *Deleted

## 2015-03-07 DIAGNOSIS — I442 Atrioventricular block, complete: Secondary | ICD-10-CM

## 2015-03-07 NOTE — Progress Notes (Signed)
Remote pacemaker transmission.   

## 2015-03-07 NOTE — Telephone Encounter (Signed)
Spoke with pt and reminded pt of remote transmission that is due today. Pt verbalized understanding.   

## 2015-03-08 ENCOUNTER — Encounter: Payer: Self-pay | Admitting: Cardiology

## 2015-03-08 LAB — CUP PACEART REMOTE DEVICE CHECK
Battery Impedance: 136 Ohm
Battery Remaining Longevity: 134 mo
Brady Statistic AP VP Percent: 48 %
Brady Statistic AS VP Percent: 52 %
Brady Statistic AS VS Percent: 0 %
Implantable Lead Implant Date: 20060525
Implantable Lead Location: 753859
Implantable Lead Model: 5076
Lead Channel Impedance Value: 457 Ohm
Lead Channel Impedance Value: 465 Ohm
Lead Channel Pacing Threshold Amplitude: 0.5 V
Lead Channel Pacing Threshold Amplitude: 1 V
Lead Channel Pacing Threshold Pulse Width: 0.4 ms
Lead Channel Sensing Intrinsic Amplitude: 1.4 mV
Lead Channel Setting Pacing Pulse Width: 0.4 ms
MDC IDC LEAD IMPLANT DT: 20060525
MDC IDC LEAD LOCATION: 753860
MDC IDC MSMT BATTERY VOLTAGE: 2.79 V
MDC IDC MSMT LEADCHNL RV PACING THRESHOLD PULSEWIDTH: 0.4 ms
MDC IDC SESS DTM: 20161107172838
MDC IDC SET LEADCHNL RA PACING AMPLITUDE: 1.5 V
MDC IDC SET LEADCHNL RV PACING AMPLITUDE: 2 V
MDC IDC SET LEADCHNL RV SENSING SENSITIVITY: 2.8 mV
MDC IDC STAT BRADY AP VS PERCENT: 0 %

## 2015-03-10 DIAGNOSIS — H2511 Age-related nuclear cataract, right eye: Secondary | ICD-10-CM | POA: Diagnosis not present

## 2015-03-10 DIAGNOSIS — H25811 Combined forms of age-related cataract, right eye: Secondary | ICD-10-CM | POA: Diagnosis not present

## 2015-03-11 DIAGNOSIS — H2512 Age-related nuclear cataract, left eye: Secondary | ICD-10-CM | POA: Diagnosis not present

## 2015-03-22 ENCOUNTER — Other Ambulatory Visit: Payer: Self-pay

## 2015-03-22 MED ORDER — LISINOPRIL-HYDROCHLOROTHIAZIDE 20-12.5 MG PO TABS: 1.0000 | ORAL_TABLET | Freq: Every day | ORAL | Status: DC

## 2015-03-31 DIAGNOSIS — H25012 Cortical age-related cataract, left eye: Secondary | ICD-10-CM | POA: Diagnosis not present

## 2015-03-31 DIAGNOSIS — H25812 Combined forms of age-related cataract, left eye: Secondary | ICD-10-CM | POA: Diagnosis not present

## 2015-03-31 DIAGNOSIS — H2512 Age-related nuclear cataract, left eye: Secondary | ICD-10-CM | POA: Diagnosis not present

## 2015-04-18 ENCOUNTER — Other Ambulatory Visit: Payer: Self-pay | Admitting: Internal Medicine

## 2015-05-03 ENCOUNTER — Other Ambulatory Visit: Payer: Self-pay

## 2015-05-03 MED ORDER — LABETALOL HCL 200 MG PO TABS: 200.0000 mg | ORAL_TABLET | Freq: Two times a day (BID) | ORAL | Status: DC

## 2015-05-03 NOTE — Telephone Encounter (Signed)
[name], MD at 09/06/2014 10:58 AM  labetalol (NORMODYNE) 200 MG tabletTake 200 mg by mouth 2 (two) times daily Patient Instructions     Medication Instructions:  Your physician recommends that you continue on your current medications as directed. Please refer to the Current Medication list given to you today

## 2015-05-30 DIAGNOSIS — E7800 Pure hypercholesterolemia, unspecified: Secondary | ICD-10-CM | POA: Diagnosis not present

## 2015-05-30 DIAGNOSIS — Z79899 Other long term (current) drug therapy: Secondary | ICD-10-CM | POA: Diagnosis not present

## 2015-06-02 DIAGNOSIS — Z8673 Personal history of transient ischemic attack (TIA), and cerebral infarction without residual deficits: Secondary | ICD-10-CM | POA: Diagnosis not present

## 2015-06-02 DIAGNOSIS — E7800 Pure hypercholesterolemia, unspecified: Secondary | ICD-10-CM | POA: Diagnosis not present

## 2015-06-02 DIAGNOSIS — R300 Dysuria: Secondary | ICD-10-CM | POA: Diagnosis not present

## 2015-06-02 DIAGNOSIS — R7303 Prediabetes: Secondary | ICD-10-CM | POA: Diagnosis not present

## 2015-06-02 DIAGNOSIS — I10 Essential (primary) hypertension: Secondary | ICD-10-CM | POA: Diagnosis not present

## 2015-06-06 ENCOUNTER — Telehealth: Payer: Self-pay | Admitting: Cardiology

## 2015-06-06 ENCOUNTER — Ambulatory Visit (INDEPENDENT_AMBULATORY_CARE_PROVIDER_SITE_OTHER): Payer: Medicare Other | Admitting: *Deleted

## 2015-06-06 DIAGNOSIS — I442 Atrioventricular block, complete: Secondary | ICD-10-CM

## 2015-06-06 NOTE — Telephone Encounter (Signed)
Spoke with pt and reminded pt of remote transmission that is due today. Pt verbalized understanding.   

## 2015-06-07 ENCOUNTER — Encounter: Payer: Self-pay | Admitting: Internal Medicine

## 2015-06-07 NOTE — Progress Notes (Signed)
Remote pacemaker transmission.   

## 2015-06-20 DIAGNOSIS — I517 Cardiomegaly: Secondary | ICD-10-CM | POA: Diagnosis not present

## 2015-06-20 DIAGNOSIS — R042 Hemoptysis: Secondary | ICD-10-CM | POA: Diagnosis not present

## 2015-06-20 DIAGNOSIS — R05 Cough: Secondary | ICD-10-CM | POA: Diagnosis not present

## 2015-06-26 LAB — CUP PACEART REMOTE DEVICE CHECK
Brady Statistic AP VS Percent: 0 %
Brady Statistic AS VP Percent: 54 %
Brady Statistic AS VS Percent: 0 %
Date Time Interrogation Session: 20170206171529
Implantable Lead Location: 753859
Implantable Lead Location: 753860
Implantable Lead Model: 5076
Implantable Lead Model: 5076
Lead Channel Pacing Threshold Amplitude: 0.5 V
Lead Channel Pacing Threshold Amplitude: 1 V
Lead Channel Pacing Threshold Pulse Width: 0.4 ms
Lead Channel Pacing Threshold Pulse Width: 0.4 ms
Lead Channel Setting Pacing Amplitude: 1.5 V
Lead Channel Setting Sensing Sensitivity: 2.8 mV
MDC IDC LEAD IMPLANT DT: 20060525
MDC IDC LEAD IMPLANT DT: 20060525
MDC IDC MSMT BATTERY IMPEDANCE: 136 Ohm
MDC IDC MSMT BATTERY REMAINING LONGEVITY: 136 mo
MDC IDC MSMT BATTERY VOLTAGE: 2.79 V
MDC IDC MSMT LEADCHNL RA IMPEDANCE VALUE: 492 Ohm
MDC IDC MSMT LEADCHNL RA SENSING INTR AMPL: 1.4 mV
MDC IDC MSMT LEADCHNL RV IMPEDANCE VALUE: 487 Ohm
MDC IDC SET LEADCHNL RV PACING AMPLITUDE: 2 V
MDC IDC SET LEADCHNL RV PACING PULSEWIDTH: 0.4 ms
MDC IDC STAT BRADY AP VP PERCENT: 46 %

## 2015-06-29 ENCOUNTER — Encounter: Payer: Self-pay | Admitting: Cardiology

## 2015-07-17 ENCOUNTER — Other Ambulatory Visit: Payer: Self-pay | Admitting: Internal Medicine

## 2015-07-19 DIAGNOSIS — E669 Obesity, unspecified: Secondary | ICD-10-CM | POA: Diagnosis not present

## 2015-07-19 DIAGNOSIS — M25562 Pain in left knee: Secondary | ICD-10-CM | POA: Diagnosis not present

## 2015-07-19 DIAGNOSIS — Z1231 Encounter for screening mammogram for malignant neoplasm of breast: Secondary | ICD-10-CM | POA: Diagnosis not present

## 2015-07-19 DIAGNOSIS — R1030 Lower abdominal pain, unspecified: Secondary | ICD-10-CM | POA: Diagnosis not present

## 2015-07-29 DIAGNOSIS — Z1231 Encounter for screening mammogram for malignant neoplasm of breast: Secondary | ICD-10-CM | POA: Diagnosis not present

## 2015-08-29 ENCOUNTER — Other Ambulatory Visit: Payer: Self-pay | Admitting: Internal Medicine

## 2015-09-13 DIAGNOSIS — H40013 Open angle with borderline findings, low risk, bilateral: Secondary | ICD-10-CM | POA: Diagnosis not present

## 2015-09-30 ENCOUNTER — Ambulatory Visit (INDEPENDENT_AMBULATORY_CARE_PROVIDER_SITE_OTHER): Payer: Medicare Other | Admitting: Internal Medicine

## 2015-09-30 ENCOUNTER — Encounter: Payer: Self-pay | Admitting: Internal Medicine

## 2015-09-30 VITALS — BP 150/88 | HR 92 | Ht 63.0 in | Wt 209.8 lb

## 2015-09-30 DIAGNOSIS — I442 Atrioventricular block, complete: Secondary | ICD-10-CM

## 2015-09-30 DIAGNOSIS — Z950 Presence of cardiac pacemaker: Secondary | ICD-10-CM | POA: Diagnosis not present

## 2015-09-30 LAB — CUP PACEART INCLINIC DEVICE CHECK
Battery Impedance: 136 Ohm
Battery Remaining Longevity: 136 mo
Brady Statistic AP VP Percent: 44 %
Brady Statistic AP VS Percent: 0 %
Brady Statistic AS VP Percent: 56 %
Brady Statistic AS VS Percent: 0 %
Implantable Lead Implant Date: 20060525
Implantable Lead Location: 753859
Lead Channel Impedance Value: 475 Ohm
Lead Channel Impedance Value: 492 Ohm
Lead Channel Pacing Threshold Amplitude: 0.75 V
Lead Channel Pacing Threshold Pulse Width: 0.4 ms
Lead Channel Sensing Intrinsic Amplitude: 2 mV
MDC IDC LEAD IMPLANT DT: 20060525
MDC IDC LEAD LOCATION: 753860
MDC IDC MSMT BATTERY VOLTAGE: 2.77 V
MDC IDC MSMT LEADCHNL RV PACING THRESHOLD AMPLITUDE: 1 V
MDC IDC MSMT LEADCHNL RV PACING THRESHOLD PULSEWIDTH: 0.4 ms
MDC IDC SESS DTM: 20170602110929
MDC IDC SET LEADCHNL RA PACING AMPLITUDE: 1.5 V
MDC IDC SET LEADCHNL RV PACING AMPLITUDE: 2 V
MDC IDC SET LEADCHNL RV PACING PULSEWIDTH: 0.4 ms
MDC IDC SET LEADCHNL RV SENSING SENSITIVITY: 2.8 mV

## 2015-09-30 NOTE — Progress Notes (Signed)
skf      Patient Care Team: Leonides Sake, MD as PCP - General (Family Medicine)   HPI  [name] is a 82 y.o. female Seen in followup for  pacemaker implanted remotely for complete heart block. She underwent device generator replacement 4/15.  She is a prior stroke and history of hypertension.  The patient denies chest pain, shortness of breath, nocturnal dyspnea, orthopnea or peripheral edema.  There have been no palpitations, lightheadedness or syncope.   She is not ready stable on her feet however. Past Medical History  Diagnosis Date  . Complete heart block ([place])   . History of hypertension   . History of stroke   . Hypokalemia   . Hyperlipidemia   . Hyperhomocysteinemia ([place])   . Pacemaker-Medtronic 08/07/2011    Past Surgical History  Procedure Laterality Date  . Pacemaker insertion      Medtronic Enpulse dual-chamber pacemaker  . Cardiac catheterization    . Permanent pacemaker generator change N/A 07/30/2013    Procedure: PERMANENT PACEMAKER GENERATOR CHANGE;  Surgeon: [name], MD;  Location: [facility] CATH LAB;  Service: Cardiovascular;  Laterality: N/A;    Current Outpatient Prescriptions  Medication Sig Dispense Refill  . aspirin 81 MG tablet Take 81 mg by mouth daily.    . clopidogrel (PLAVIX) 75 MG tablet Take 75 mg by mouth daily with breakfast.    . labetalol (NORMODYNE) 200 MG tablet TAKE 1 TABLET (200 MG TOTAL) BY MOUTH 2 (TWO) TIMES DAILY. 180 tablet 0  . lisinopril-hydrochlorothiazide (PRINZIDE,ZESTORETIC) 20-12.5 MG tablet Take 1 tablet by mouth daily. 30 tablet 3  . simvastatin (ZOCOR) 40 MG tablet TAKE 1 TABLET BY MOUTH AT BEDTIME 30 tablet 3   No current facility-administered medications for this visit.    No Known Allergies  Review of Systems negative except from HPI and PMH  Physical Exam BP 150/88 mmHg  Pulse 92  Ht '5\' 3"'$  (1.6 m)  Wt 209 lb 12.8 oz (95.165 kg)  BMI 37.17 kg/m2 Well developed and well nourished in no acute  distress HENT normal E scleral and icterus clear Neck Supple JVP flat; carotids brisk and full Clear to ausculation Device pocket well healed; without hematoma or erythema.  There is no tethering Regular rate and rhythm, no murmurs gallops or rub Soft with active bowel sounds No clubbing cyanosis  Edema Alert and oriented,  difficulty with walking with a wide-based gait Skin Warm and Dry  ECG demonstrates AV pacing  Assessment and  Plan  Hypertension- l elevated  Complete heart block  Lipids      CVA no intercurrent AFib   Pacemaker-Medtronic  The patient's device was interrogated.  The information was reviewed. No changes were made in the programming.    Heart block is controlled in the context of her pacemaker  We'll continue aspirin/Plavix given her stroke;  she's had some atrial tachycardia but no evidence of atrial fibrillation through her device  She has significant impairment of balance. I note that her cane cap is worn out. I've encouraged her to follow up with Dr. Lisbeth Ply to consider physical therapy for balance.  I've asked her to follow up her blood pressures at home.

## 2015-09-30 NOTE — Patient Instructions (Signed)
Medication Instructions: - Your physician recommends that you continue on your current medications as directed. Please refer to the Current Medication list given to you today.  Labwork: - none  Procedures/Testing: - none  Follow-Up: - Remote monitoring is used to monitor your Pacemaker of ICD from home. This monitoring reduces the number of office visits required to check your device to one time per year. It allows [place] to keep an eye on the functioning of your device to ensure it is working properly. You are scheduled for a device check from home on 01/02/16. You may send your transmission at any time that day. If you have a wireless device, the transmission will be sent automatically. After your physician reviews your transmission, you will receive a postcard with your next transmission date.  - Your physician wants you to follow-up in: 1 year with Chanetta Napoles, NP for Dr. Caryl Comes. You will receive a reminder letter in the mail two months in advance. If you don't receive a letter, please call our office to schedule the follow-up appointment.  Any Additional Special Instructions Will Be Listed Below (If Applicable).     If you need a refill on your cardiac medications before your next appointment, please call your pharmacy.

## 2015-11-03 DIAGNOSIS — T783XXA Angioneurotic edema, initial encounter: Secondary | ICD-10-CM | POA: Diagnosis not present

## 2015-11-18 ENCOUNTER — Other Ambulatory Visit: Payer: Self-pay | Admitting: *Deleted

## 2015-11-18 MED ORDER — LISINOPRIL-HYDROCHLOROTHIAZIDE 20-12.5 MG PO TABS: 1.0000 | ORAL_TABLET | Freq: Every day | ORAL | Status: DC

## 2015-11-30 DIAGNOSIS — E7800 Pure hypercholesterolemia, unspecified: Secondary | ICD-10-CM | POA: Diagnosis not present

## 2015-11-30 DIAGNOSIS — Z79899 Other long term (current) drug therapy: Secondary | ICD-10-CM | POA: Diagnosis not present

## 2015-12-07 DIAGNOSIS — Z139 Encounter for screening, unspecified: Secondary | ICD-10-CM | POA: Diagnosis not present

## 2015-12-07 DIAGNOSIS — Z1389 Encounter for screening for other disorder: Secondary | ICD-10-CM | POA: Diagnosis not present

## 2015-12-07 DIAGNOSIS — Z8673 Personal history of transient ischemic attack (TIA), and cerebral infarction without residual deficits: Secondary | ICD-10-CM | POA: Diagnosis not present

## 2015-12-07 DIAGNOSIS — R7303 Prediabetes: Secondary | ICD-10-CM | POA: Diagnosis not present

## 2015-12-07 DIAGNOSIS — Z9181 History of falling: Secondary | ICD-10-CM | POA: Diagnosis not present

## 2015-12-07 DIAGNOSIS — E7800 Pure hypercholesterolemia, unspecified: Secondary | ICD-10-CM | POA: Diagnosis not present

## 2015-12-07 DIAGNOSIS — I10 Essential (primary) hypertension: Secondary | ICD-10-CM | POA: Diagnosis not present

## 2015-12-09 ENCOUNTER — Other Ambulatory Visit: Payer: Self-pay | Admitting: Internal Medicine

## 2015-12-26 ENCOUNTER — Other Ambulatory Visit: Payer: Self-pay | Admitting: Internal Medicine

## 2015-12-28 ENCOUNTER — Other Ambulatory Visit: Payer: Self-pay | Admitting: *Deleted

## 2015-12-28 MED ORDER — SIMVASTATIN 40 MG PO TABS: 40.0000 mg | ORAL_TABLET | Freq: Every day | ORAL | 2 refills | Status: DC

## 2015-12-28 NOTE — Telephone Encounter (Signed)
Do you mind calling Dr. Claybon Jabs office to see if they have been following her lipids. If so, we can send RX to them to refill. If not, then can you please call the patient and ask her to come in for fasting labs. Just let me know what date and I will place the order and schedule. Thanks!

## 2016-01-03 ENCOUNTER — Ambulatory Visit (INDEPENDENT_AMBULATORY_CARE_PROVIDER_SITE_OTHER): Payer: Medicare Other | Admitting: *Deleted

## 2016-01-03 ENCOUNTER — Telehealth: Payer: Self-pay | Admitting: Cardiology

## 2016-01-03 DIAGNOSIS — I442 Atrioventricular block, complete: Secondary | ICD-10-CM | POA: Diagnosis not present

## 2016-01-03 DIAGNOSIS — Z950 Presence of cardiac pacemaker: Secondary | ICD-10-CM

## 2016-01-03 NOTE — Telephone Encounter (Signed)
Spoke with pt and reminded pt of remote transmission that is due today. Pt verbalized understanding.   

## 2016-01-03 NOTE — Progress Notes (Signed)
Remote pacemaker transmission.   

## 2016-01-04 LAB — CUP PACEART REMOTE DEVICE CHECK
Battery Remaining Longevity: 121 mo
Battery Voltage: 2.78 V
Brady Statistic AP VS Percent: 0 %
Date Time Interrogation Session: 20170905153555
Implantable Lead Implant Date: 20060525
Implantable Lead Model: 5076
Lead Channel Pacing Threshold Pulse Width: 0.4 ms
Lead Channel Pacing Threshold Pulse Width: 0.4 ms
Lead Channel Setting Pacing Amplitude: 1.5 V
Lead Channel Setting Pacing Amplitude: 2 V
Lead Channel Setting Pacing Pulse Width: 0.46 ms
Lead Channel Setting Sensing Sensitivity: 2.8 mV
MDC IDC LEAD IMPLANT DT: 20060525
MDC IDC LEAD LOCATION: 753859
MDC IDC LEAD LOCATION: 753860
MDC IDC MSMT BATTERY IMPEDANCE: 184 Ohm
MDC IDC MSMT LEADCHNL RA IMPEDANCE VALUE: 452 Ohm
MDC IDC MSMT LEADCHNL RA PACING THRESHOLD AMPLITUDE: 0.625 V
MDC IDC MSMT LEADCHNL RA SENSING INTR AMPL: 1.4 mV
MDC IDC MSMT LEADCHNL RV IMPEDANCE VALUE: 445 Ohm
MDC IDC MSMT LEADCHNL RV PACING THRESHOLD AMPLITUDE: 1 V
MDC IDC STAT BRADY AP VP PERCENT: 50 %
MDC IDC STAT BRADY AS VP PERCENT: 50 %
MDC IDC STAT BRADY AS VS PERCENT: 0 %

## 2016-01-06 ENCOUNTER — Encounter: Payer: Self-pay | Admitting: Cardiology

## 2016-04-03 ENCOUNTER — Telehealth: Payer: Self-pay | Admitting: Cardiology

## 2016-04-03 ENCOUNTER — Ambulatory Visit (INDEPENDENT_AMBULATORY_CARE_PROVIDER_SITE_OTHER): Payer: Medicare Other | Admitting: *Deleted

## 2016-04-03 DIAGNOSIS — I442 Atrioventricular block, complete: Secondary | ICD-10-CM | POA: Diagnosis not present

## 2016-04-03 NOTE — Telephone Encounter (Signed)
Spoke with pt and reminded pt of remote transmission that is due today. Pt verbalized understanding.   

## 2016-04-03 NOTE — Progress Notes (Signed)
Remote pacemaker transmission.   

## 2016-04-11 ENCOUNTER — Encounter: Payer: Self-pay | Admitting: Cardiology

## 2016-04-19 LAB — CUP PACEART REMOTE DEVICE CHECK
Battery Impedance: 208 Ohm
Battery Voltage: 2.79 V
Brady Statistic AP VP Percent: 41 %
Brady Statistic AP VS Percent: 0 %
Brady Statistic AS VP Percent: 59 %
Brady Statistic AS VS Percent: 0 %
Date Time Interrogation Session: 20171205172346
Implantable Lead Implant Date: 20060525
Implantable Lead Location: 753859
Implantable Lead Model: 5076
Implantable Lead Model: 5076
Lead Channel Impedance Value: 464 Ohm
Lead Channel Pacing Threshold Amplitude: 0.625 V
Lead Channel Pacing Threshold Pulse Width: 0.4 ms
Lead Channel Setting Pacing Amplitude: 2.25 V
Lead Channel Setting Pacing Pulse Width: 0.4 ms
MDC IDC LEAD IMPLANT DT: 20060525
MDC IDC LEAD LOCATION: 753860
MDC IDC MSMT BATTERY REMAINING LONGEVITY: 118 mo
MDC IDC MSMT LEADCHNL RA IMPEDANCE VALUE: 465 Ohm
MDC IDC MSMT LEADCHNL RV PACING THRESHOLD AMPLITUDE: 1.125 V
MDC IDC MSMT LEADCHNL RV PACING THRESHOLD PULSEWIDTH: 0.4 ms
MDC IDC PG IMPLANT DT: 20150402
MDC IDC SET LEADCHNL RA PACING AMPLITUDE: 1.5 V
MDC IDC SET LEADCHNL RV SENSING SENSITIVITY: 2.8 mV

## 2016-06-11 DIAGNOSIS — I160 Hypertensive urgency: Secondary | ICD-10-CM | POA: Insufficient documentation

## 2016-06-11 DIAGNOSIS — I2510 Atherosclerotic heart disease of native coronary artery without angina pectoris: Secondary | ICD-10-CM | POA: Insufficient documentation

## 2016-06-11 DIAGNOSIS — I10 Essential (primary) hypertension: Secondary | ICD-10-CM

## 2016-06-12 ENCOUNTER — Telehealth: Payer: Self-pay | Admitting: Internal Medicine

## 2016-06-12 ENCOUNTER — Emergency Department (HOSPITAL_COMMUNITY)
Admission: EM | Admit: 2016-06-12 | Discharge: 2016-06-12 | Disposition: A | Discharge status: Home / Self Care | Payer: Medicare Other | Attending: Emergency Medicine | Admitting: Emergency Medicine

## 2016-06-12 ENCOUNTER — Encounter (HOSPITAL_COMMUNITY): Payer: Self-pay | Admitting: Emergency Medicine

## 2016-06-12 DIAGNOSIS — Z8673 Personal history of transient ischemic attack (TIA), and cerebral infarction without residual deficits: Secondary | ICD-10-CM | POA: Diagnosis not present

## 2016-06-12 DIAGNOSIS — R269 Unspecified abnormalities of gait and mobility: Secondary | ICD-10-CM | POA: Insufficient documentation

## 2016-06-12 DIAGNOSIS — I10 Essential (primary) hypertension: Secondary | ICD-10-CM | POA: Diagnosis not present

## 2016-06-12 DIAGNOSIS — Z7982 Long term (current) use of aspirin: Secondary | ICD-10-CM | POA: Insufficient documentation

## 2016-06-12 DIAGNOSIS — Z950 Presence of cardiac pacemaker: Secondary | ICD-10-CM | POA: Diagnosis not present

## 2016-06-12 DIAGNOSIS — Z87891 Personal history of nicotine dependence: Secondary | ICD-10-CM | POA: Diagnosis not present

## 2016-06-12 HISTORY — DX: Essential (primary) hypertension: I10

## 2016-06-12 LAB — CBC WITH DIFFERENTIAL/PLATELET
Basophils Absolute: 0 10*3/uL (ref 0.0–0.1)
Basophils Relative: 0 %
EOS ABS: 0 10*3/uL (ref 0.0–0.7)
Eosinophils Relative: 0 %
HEMATOCRIT: 42.9 % (ref 36.0–46.0)
HEMOGLOBIN: 13.5 g/dL (ref 12.0–15.0)
LYMPHS ABS: 1.2 10*3/uL (ref 0.7–4.0)
LYMPHS PCT: 18 %
MCH: 28 pg (ref 26.0–34.0)
MCHC: 31.5 g/dL (ref 30.0–36.0)
MCV: 89 fL (ref 78.0–100.0)
MONOS PCT: 7 %
Monocytes Absolute: 0.5 10*3/uL (ref 0.1–1.0)
NEUTROS PCT: 75 %
Neutro Abs: 5 10*3/uL (ref 1.7–7.7)
Platelets: 176 10*3/uL (ref 150–400)
RBC: 4.82 MIL/uL (ref 3.87–5.11)
RDW: 15.1 % (ref 11.5–15.5)
WBC: 6.7 10*3/uL (ref 4.0–10.5)

## 2016-06-12 LAB — URINALYSIS, ROUTINE W REFLEX MICROSCOPIC
Bilirubin Urine: NEGATIVE
Glucose, UA: NEGATIVE mg/dL
Ketones, ur: NEGATIVE mg/dL
NITRITE: NEGATIVE
PH: 7 (ref 5.0–8.0)
Protein, ur: 100 mg/dL — AB
Specific Gravity, Urine: 1.012 (ref 1.005–1.030)

## 2016-06-12 LAB — COMPREHENSIVE METABOLIC PANEL
ALK PHOS: 61 U/L (ref 38–126)
ALT: 20 U/L (ref 14–54)
ANION GAP: 13 (ref 5–15)
AST: 15 U/L (ref 15–41)
Albumin: 3.9 g/dL (ref 3.5–5.0)
BILIRUBIN TOTAL: 1.4 mg/dL — AB (ref 0.3–1.2)
BUN: 9 mg/dL (ref 6–20)
CALCIUM: 9.6 mg/dL (ref 8.9–10.3)
CO2: 28 mmol/L (ref 22–32)
Chloride: 101 mmol/L (ref 101–111)
Creatinine, Ser: 0.69 mg/dL (ref 0.44–1.00)
Glucose, Bld: 109 mg/dL — ABNORMAL HIGH (ref 65–99)
Potassium: 3.6 mmol/L (ref 3.5–5.1)
Sodium: 142 mmol/L (ref 135–145)
TOTAL PROTEIN: 6.7 g/dL (ref 6.5–8.1)

## 2016-06-12 NOTE — ED Notes (Signed)
Pt placed on bedpan at this time; call bell within reach and instructed to call when she is finished; pt and family verbalized understanding

## 2016-06-12 NOTE — Discharge Instructions (Signed)
As discussed, your evaluation today has been largely reassuring.  But, it is important that you monitor your condition carefully, and do not hesitate to return to the ED if you develop new, or concerning changes in your condition. ? ?Otherwise, please follow-up with your physician for appropriate ongoing care. ? ?

## 2016-06-12 NOTE — Telephone Encounter (Signed)
New message   Pt c/o BP issue: STAT if pt c/o blurred vision, one-sided weakness or slurred speech  1. What are your last 5 BP readings? no  2. Are you having any other symptoms (ex. Dizziness, headache, blurred vision, passed out)? Dizziness, nausea   3. What is your BP issue? high

## 2016-06-12 NOTE — Telephone Encounter (Signed)
New Message    Drug interaction   simvastatin (ZOCOR) 40 MG tablet Take 1 tablet (40 mg total) by mouth at bedtime.   And pt was put on amlodipine yesterday in er please call

## 2016-06-12 NOTE — ED Triage Notes (Addendum)
Pt to ED with multi. Complaints.,  Pt was seen at [place] on Sun night for hypertension and nausea.  Pt st's nausea has subsided but did have vomiting this am., continues to feel weak all over. Pt denies any chest pain.  St's her head feels full.  Pt st;s she feels to weak to walk

## 2016-06-12 NOTE — Telephone Encounter (Signed)
Called and requested a copy of the office note from Charlott Holler, NP that the patient saw earlier today. Note received. The patient is currently taking amlodipine 10 mg once daily-started on 2/12 at [facility]. Per office note, the patient was to follow up in 2 weeks with primary care for a BP check.  Dr. Caryl Comes aware- encouraged follow up with the PCP. I notifed Rosetta of this- she states the patient is currently in the ER at [facility]. I inquired what symptoms she was having- per Rosetta "the same symptoms she has been having." I inquired what these were- per Rosetta, the patient was complaining of a headache, but no cardiac complaints. I advised Rosetta that I will follow along with the ER/ hospital records, but if they feel that she needs to be seen urgently by cardiology, they should notify our office. Otherwise, I will review her records when I am back on Thursday and figure out when she should be seen, but this will most likely be with the PA/ NP. She voices understanding.  Per Dr. Caryl Comes, the patient will need to decrease simvastatin to 20 mg once daily due to amlodipine- will follow up with this on Thursday if the patient stays on amlodipine.

## 2016-06-12 NOTE — ED Notes (Signed)
ED Provider at bedside. 

## 2016-06-12 NOTE — Telephone Encounter (Signed)
I called and spoke with the patient's grand-daughter, Biviana Saddler. She reports that the patient was in the ER at [facility] yesterday morning. She woke up at 4 am that morning feeling "drunk." Per Rosetta, the patient's was about 222/111. She was seen by her PCP today and her BP was still high. She did not know the numbers. She only knows she was seen at [facility] this morning and that the provider there said she needed to see cardiology ASAP. I advised I would call [facility] and see what the patient's BP was and what the plan was for her.  I called [employer] [phone].-- per staff, the patient saw Alphonsus Sias, NP this morning.  Her BP was 158/90, but the providers note is incomplete and they are unable to tell me what the plan for the patient is, but they do not see a referral to cardiology.  They will fax a copy of her vital signs to me.   Copy of Vital signs received. I did call back to [facility] to see it the NP's note was done on this patient and it is, they will fax a copy of this to me for Dr. Caryl Comes to review.

## 2016-06-12 NOTE — ED Provider Notes (Signed)
[facility] DEPT Provider Note   CSN: [id number] Arrival date & time: 06/12/16  1547     History   Chief Complaint Chief Complaint  Patient presents with  . Weakness    HPI [name] is a 83 y.o. female.  HPI Patient presents with her daughter who assists with the history of present illness. Patient was generally well until about 48 hours ago. Since that time she has had generalized discomfort, headache, nausea, gait difficulty. Patient actually states that she feels slightly better than during most severe part of her illness, which was yesterday. Note, the patient has multiple medical issues including prior stroke, hypertension. Patient was recently stopped from taking her blood pressure medication, by her physicians. Yesterday, after symptoms began, she went to a different emergency department. There she was admitted after initial studies found her to be hypertensive. She had her daughter note that the patient was discharged, with her walker, which is an increase in gait support from her typical cane No other new prescription given, though the patient was advised to restart her home blood pressure medication, and to continue taking Plavix. Patient has a pacemaker, is not MRI eligible (I checked the eligibility).   Past Medical History:  Diagnosis Date  . Complete heart block ([place])   . History of hypertension   . History of stroke   . Hyperhomocysteinemia ([place])   . Hyperlipidemia   . Hypertension   . Hypokalemia   . Pacemaker-Medtronic 08/07/2011    Patient Active Problem List   Diagnosis Date Noted  . Pacemaker-Medtronic 08/07/2011  . Atrial fibrillation ([place]) 08/07/2011  . Daytime somnolence 08/07/2011  . HYPERTENSION, HEART CONTROLLED W/O ASSOC CHF 06/07/2010  . AV BLOCK, COMPLETE 06/07/2010  . CVA 06/07/2010    Past Surgical History:  Procedure Laterality Date  . CARDIAC CATHETERIZATION    . PACEMAKER INSERTION     Medtronic Enpulse  dual-chamber pacemaker  . PERMANENT PACEMAKER GENERATOR CHANGE N/A 07/30/2013   Procedure: PERMANENT PACEMAKER GENERATOR CHANGE;  Surgeon: [name], MD;  Location: [facility] CATH LAB;  Service: Cardiovascular;  Laterality: N/A;    OB History    No data available       Home Medications    Prior to Admission medications   Medication Sig Start Date End Date Taking? Authorizing Provider  aspirin 81 MG tablet Take 81 mg by mouth daily.    Historical Provider, MD  clopidogrel (PLAVIX) 75 MG tablet Take 75 mg by mouth daily with breakfast.    Historical Provider, MD  labetalol (NORMODYNE) 200 MG tablet TAKE 1 TABLET (200 MG TOTAL) BY MOUTH 2 (TWO) TIMES DAILY. 12/09/15   [name], MD  lisinopril-hydrochlorothiazide (PRINZIDE,ZESTORETIC) 20-12.5 MG tablet Take 1 tablet by mouth daily. 11/18/15   [name], MD  simvastatin (ZOCOR) 40 MG tablet Take 1 tablet (40 mg total) by mouth at bedtime. 12/28/15   [name], MD    Family History Family History  Problem Relation Age of Onset  . Hypertension Mother     Social History Social History  Substance Use Topics  . Smoking status: Former Smoker    Quit date: 07/25/2006  . Smokeless tobacco: Not on file  . Alcohol use Not on file     Allergies   Patient has no known allergies.   Review of Systems Review of Systems  Constitutional:       Per HPI, otherwise negative  HENT:       Per HPI, otherwise negative  Respiratory:       Per HPI, otherwise negative  Cardiovascular:       Per HPI, otherwise negative  Gastrointestinal: Positive for nausea and vomiting. Negative for abdominal pain.  Endocrine:       Negative aside from HPI  Genitourinary:       Neg aside from HPI   Musculoskeletal:       Per HPI, otherwise negative  Skin: Negative.   Neurological: Positive for weakness and headaches. Negative for syncope and speech difficulty.     Physical Exam Updated Vital Signs BP 153/89 (BP Location: Right Arm)   Pulse  78   Temp 98.5 F (36.9 C) (Oral)   Resp 22   Ht _0  (1.575 m)   Wt 215 lb (97.5 kg)   SpO2 97%   BMI 39.32 kg/m   Physical Exam  Constitutional: She is oriented to person, place, and time. She appears well-developed and well-nourished. No distress.  HENT:  Head: Normocephalic and atraumatic.  Eyes: Conjunctivae and EOM are normal.  Cardiovascular: Normal rate and regular rhythm.   Pulmonary/Chest: Effort normal and breath sounds normal. No stridor. No respiratory distress.  Abdominal: She exhibits no distension.  Musculoskeletal: She exhibits no edema.  Neurological: She is alert and oriented to person, place, and time. She displays no tremor. No cranial nerve deficit. She exhibits normal muscle tone. She displays no seizure activity. Gait abnormal. Coordination normal.  Patient requires assistance with ambulation, but does not fall  Skin: Skin is warm and dry.  Psychiatric: She has a normal mood and affect.  Nursing note and vitals reviewed.    ED Treatments / Results  Labs (all labs ordered are listed, but only abnormal results are displayed) Labs Reviewed  COMPREHENSIVE METABOLIC PANEL - Abnormal; Notable for the following:       Result Value   Glucose, Bld 109 (*)    Total Bilirubin 1.4 (*)    All other components within normal limits  CBC WITH DIFFERENTIAL/PLATELET  URINALYSIS, ROUTINE W REFLEX MICROSCOPIC    EKG  EKG Interpretation  Date/Time:  Tuesday June 12 2016 16:58:31 EST Ventricular Rate:  70 PR Interval:  172 QRS Duration: 134 QT Interval:  450 QTC Calculation: 486 R Axis:   -34 Text Interpretation:  Atrial-sensed ventricular-paced rhythm Abnormal ekg Confirmed by Carmin Muskrat  MD [phone]) on 06/12/2016 7:19:09 PM       Radiology CT CXR from yesterday reviewed (see below) Procedures Procedures (including critical care time)  EMR from OSH reviewed: (2/12 - yesterday visit) Ct Head Wo Contrast  Result Date: 06/11/2016 CLINICAL DATA:  Weakness and dizziness since yesterday. Worse today. Recent treatment for shingles. EXAM: CT HEAD WITHOUT CONTRAST TECHNIQUE: Contiguous axial images were obtained from the base of the skull through the vertex without intravenous contrast. COMPARISON: None. FINDINGS: Brain: Diffuse cerebral atrophy. Ventricular dilatation consistent with central atrophy. Low-attenuation changes in the deep white matter consistent with small vessel ischemia. Old appearing lacunar infarct in the right basal ganglia. No mass effect or midline shift. No abnormal extra-axial fluid collections. Gray-white matter junctions are distinct. Basal cisterns are not effaced. No acute intracranial hemorrhage. Old cerebellar infarcts. Vascular: Vascular calcifications in the carotid sinuses. Skull: Normal. Negative for fracture or focal lesion. Sinuses/Orbits: Maxillary antra are hypoaerated. Paranasal sinuses and mastoid air cells are clear. Other: None.   Chronic atrophy and small vessel ischemic changes. Old lacunar infarcts. No acute intracranial abnormalities. Electronically Signed By: Lucienne Capers M.D. On: 06/11/2016 06:00  Xr Chest Pa And Lateral  Result Date: 06/11/2016 CLINICAL DATA: 83 y/o F; dizziness. EXAM: CHEST 2 VIEW COMPARISON: 06/20/2015 chest radiograph. FINDINGS: Stable borderline cardiomegaly given projection and technique. 2 lead pacemaker. Clear lungs. No acute osseous abnormality identified.   No active cardiopulmonary disease. Electronically Signed By: Kristine Garbe M.D. On: 06/11/2016 05:59   Most Recent Labs:  Recent Labs Lab Units 06/11/16 0436  WBC 10*9/L 7.5  HEMOGLOBIN g/dL 13.4  HEMATOCRIT % 40.6  MCV fL 85.7  PLATELET COUNT (1) 10*9/L 168   Recent Labs Lab Units 06/11/16 0435  SODIUM mmol/L 143  POTASSIUM mmol/L 3.4*  CHLORIDE mmol/L 101  CO2 mmol/L 35.0*  BUN mg/dL 12  CREATININE mg/dL 0.60  GLUCOSE mg/dL 146*  CALCIUM mg/dL 9.1  MAGNESIUM mg/dL 1.6  ALBUMIN g/dL 3.7    ALT U/L 35  AST U/L 16  ALK PHOS U/L 84  BILIRUBIN TOTAL mg/dL 0.7  PROTEIN TOTAL g/dL 6.4*    There are also notes that on discharge the patient was provided a walker, arrangements were made for home health, physical therapy and the patient has a outpatient follow-up visit with her primary care physician for later this week.     Initial Impression / Assessment and Plan / ED Course  I have reviewed the triage vital signs and the nursing notes.  Pertinent labs & imaging results that were available during my care of the patient were reviewed by me and considered in my medical decision making (see chart for details).  10:57 PM Patient in no distress, awake, alert, states that she feels generally better. I had a lengthy conversation with the patient and her daughter about results yesterday and today including reassuring CT scan, x-ray, as well as urinalysis with trace blood. Patient has primary care visit scheduled in 3 days, and per discharge from the other facility will have home health visit within the next 2 days. We discussed the possibility of stroke, the patient's ineligible for MRI, and her previously scheduled appropriate follow-up, as well as appropriate medication for stroke prevention; the importance of taking this medication. No evidence for new infection, bacteremia, sepsis, sustained new neurologic dysfunction. With yesterday's and today's generally reassuring results, patient is appropriate for further evaluation, management as an outpatient.   Final Clinical Impressions(s) / ED Diagnoses  Gait difficulty   Carmin Muskrat, MD 06/12/16 2258

## 2016-06-13 ENCOUNTER — Telehealth: Payer: Self-pay | Admitting: Cardiology

## 2016-06-13 NOTE — Telephone Encounter (Signed)
This has been addressed by Device triage and will route this message now to pts Primary Cardiologist, Dr Martinique, and NL triage for follow-up with this pt.

## 2016-06-13 NOTE — Telephone Encounter (Signed)
Pt daughter in law called (DPR on file) and stated that pt went to [facility] ER on Monday 9-70-26 where her Systolic B was 378. They were able to get the systolic BP down to 588. Pt went to [name] on Tuesday 08-30-75 and her systolic BP was 412. Pt has no shortness of breath, no dizziness. But pt has been off balance and is now walking with a walker to keep from falling. Instructed pt daughter in law to send a remote transmission. Once transmission is received someone will call back w/ recommendations. Pt daughter in law verbalized understanding.

## 2016-06-13 NOTE — Telephone Encounter (Signed)
Returned call to patient advised pacemaker down load was normal.Advised she only sees Dr.Jordan if needed.Stated she feels dizzy,"drunk"feeling.Advised to keep appointment with Dr.Klein as planned.Keep appointment with PCP this Friday 06/15/16.

## 2016-06-13 NOTE — Telephone Encounter (Signed)
Remote transmission received and reviewed, no abnormalities noted.

## 2016-06-13 NOTE — Telephone Encounter (Signed)
Open by mistake

## 2016-06-14 NOTE — Telephone Encounter (Signed)
See 06/13/16 phone note for same issue.

## 2016-07-03 ENCOUNTER — Ambulatory Visit (INDEPENDENT_AMBULATORY_CARE_PROVIDER_SITE_OTHER): Payer: Medicare Other | Admitting: *Deleted

## 2016-07-03 ENCOUNTER — Telehealth: Payer: Self-pay | Admitting: Cardiology

## 2016-07-03 DIAGNOSIS — I442 Atrioventricular block, complete: Secondary | ICD-10-CM | POA: Diagnosis not present

## 2016-07-03 NOTE — Telephone Encounter (Signed)
Spoke with pt and reminded pt of remote transmission that is due today. Pt verbalized understanding.   

## 2016-07-03 NOTE — Progress Notes (Signed)
Remote pacemaker transmission.   

## 2016-07-04 ENCOUNTER — Encounter: Payer: Self-pay | Admitting: Cardiology

## 2016-07-04 LAB — CUP PACEART REMOTE DEVICE CHECK
Battery Impedance: 184 Ohm
Battery Voltage: 2.78 V
Brady Statistic AP VP Percent: 36 %
Brady Statistic AP VS Percent: 0 %
Brady Statistic AS VP Percent: 64 %
Implantable Lead Location: 753859
Implantable Lead Model: 5076
Implantable Lead Model: 5076
Lead Channel Impedance Value: 452 Ohm
Lead Channel Pacing Threshold Amplitude: 0.625 V
Lead Channel Pacing Threshold Pulse Width: 0.4 ms
Lead Channel Setting Pacing Amplitude: 1.5 V
Lead Channel Setting Pacing Amplitude: 2.5 V
Lead Channel Setting Pacing Pulse Width: 0.4 ms
MDC IDC LEAD IMPLANT DT: 20060525
MDC IDC LEAD IMPLANT DT: 20060525
MDC IDC LEAD LOCATION: 753860
MDC IDC MSMT BATTERY REMAINING LONGEVITY: 113 mo
MDC IDC MSMT LEADCHNL RV IMPEDANCE VALUE: 476 Ohm
MDC IDC MSMT LEADCHNL RV PACING THRESHOLD AMPLITUDE: 1.25 V
MDC IDC MSMT LEADCHNL RV PACING THRESHOLD PULSEWIDTH: 0.4 ms
MDC IDC PG IMPLANT DT: 20150402
MDC IDC SESS DTM: 20180306175635
MDC IDC SET LEADCHNL RV SENSING SENSITIVITY: 2.8 mV
MDC IDC STAT BRADY AS VS PERCENT: 0 %

## 2016-07-23 ENCOUNTER — Encounter: Payer: Self-pay | Admitting: Neurology

## 2016-07-23 ENCOUNTER — Encounter (INDEPENDENT_AMBULATORY_CARE_PROVIDER_SITE_OTHER): Payer: Self-pay

## 2016-07-23 ENCOUNTER — Ambulatory Visit (INDEPENDENT_AMBULATORY_CARE_PROVIDER_SITE_OTHER): Payer: Medicare Other | Admitting: Neurology

## 2016-07-23 VITALS — BP 165/88 | HR 82 | Ht 62.0 in | Wt 212.0 lb

## 2016-07-23 DIAGNOSIS — I679 Cerebrovascular disease, unspecified: Secondary | ICD-10-CM | POA: Diagnosis not present

## 2016-07-23 DIAGNOSIS — R269 Unspecified abnormalities of gait and mobility: Secondary | ICD-10-CM | POA: Diagnosis not present

## 2016-07-23 DIAGNOSIS — E538 Deficiency of other specified B group vitamins: Secondary | ICD-10-CM | POA: Diagnosis not present

## 2016-07-23 HISTORY — DX: Unspecified abnormalities of gait and mobility: R26.9

## 2016-07-23 NOTE — Progress Notes (Signed)
Reason for visit: Gait disorder  Referring physician: Dr. Ilene Qua  [name] is a 83 y.o. female  History of present illness:  [name] is an 83 year old right-handed black female with a history of some difficulty with ambulation that occurred about 2 months prior to this evaluation. The patient has a history of cerebrovascular disease in the past, she has had a mild chronic gait disorder, but she was able to ambulate only using a cane. The patient lives alone. The patient had a relatively sudden onset of worsening problems with walking. The patient began feeling nauseated around 3 AM two months ago, she was noted to have an extremely high blood pressure, she was taken to [facility], and a CT scan of the brain was done. The patient did not have evidence of an acute stroke. The CT shows chronic cerebrovascular changes with right frontal lobe white matter disease, bilateral thalamic and basal ganglia are strokes and evidence of an infarct in the left cerebellum. The patient has undergone physical therapy with some benefit, but once the physical therapy stopped her walking returned back to what it had been originally. The patient reports no falls, she is using a walker only for ambulation. She denies any numbness or weakness of the extremities, she denies any change in the way the bowels or the bladder are working. She denies any neck pain or low back pain or pain down the arms or legs. She denies headaches, slurred speech, visual changes with exception of occasional episodes of double vision. The patient has not had any problems with slurring her words or problems swallowing. She has borderline diabetes, with a hemoglobin A1c of 6.1.  Past Medical History:  Diagnosis Date  . Complete heart block ([place])   . Gait abnormality 07/23/2016  . History of hypertension   . History of stroke   . Hyperhomocysteinemia ([place])   . Hyperlipidemia   . Hypertension   . Hypokalemia   .  Pacemaker-Medtronic 08/07/2011    Past Surgical History:  Procedure Laterality Date  . CARDIAC CATHETERIZATION    . PACEMAKER INSERTION     Medtronic Enpulse dual-chamber pacemaker  . PERMANENT PACEMAKER GENERATOR CHANGE N/A 07/30/2013   Procedure: PERMANENT PACEMAKER GENERATOR CHANGE;  Surgeon: [name], MD;  Location: [facility] CATH LAB;  Service: Cardiovascular;  Laterality: N/A;    Family History  Problem Relation Age of Onset  . Hypertension Mother     Social history:  reports that she quit smoking about 10 years ago. She has never used smokeless tobacco. She reports that she does not drink alcohol or use drugs.  Medications:  Prior to Admission medications   Medication Sig Start Date End Date Taking? Authorizing Provider  acetaminophen (TYLENOL) 500 MG tablet Take 1,000 mg by mouth every 6 (six) hours as needed.   Yes Historical Provider, MD  aspirin 81 MG tablet Take 81 mg by mouth daily.   Yes Historical Provider, MD  clopidogrel (PLAVIX) 75 MG tablet Take 75 mg by mouth daily with breakfast.   Yes Historical Provider, MD  labetalol (NORMODYNE) 200 MG tablet TAKE 1 TABLET (200 MG TOTAL) BY MOUTH 2 (TWO) TIMES DAILY. 12/09/15  Yes [name], MD  simvastatin (ZOCOR) 40 MG tablet Take 1 tablet (40 mg total) by mouth at bedtime. 12/28/15  Yes [name], MD     No Known Allergies  ROS:  Out of a complete 14 system review of symptoms, the patient complains only of  the following symptoms, and all other reviewed systems are negative.  Hearing loss, ringing in the ears Moles Shortness of breath Easy bruising  Blood pressure (!) 165/88, pulse 82, height '5\' 2"'$  (1.575 m), weight 212 lb (96.2 kg).  Physical Exam  General: The patient is alert and cooperative at the time of the examination. The patient is markedly obese.  Eyes: Pupils are equal, round, and reactive to light. Discs are flat bilaterally.  Neck: The neck is supple, no carotid bruits are  noted.  Respiratory: The respiratory examination is clear.  Cardiovascular: The cardiovascular examination reveals a regular rate and rhythm, no obvious murmurs or rubs are noted.  Skin: Extremities are without significant edema.  Neurologic Exam  Mental status: The patient is alert and oriented x 3 at the time of the examination. The patient has apparent normal recent and remote memory, with an apparently normal attention span and concentration ability.  Cranial nerves: Facial symmetry is present. There is good sensation of the face to pinprick and soft touch bilaterally. The strength of the facial muscles and the muscles to head turning and shoulder shrug are normal bilaterally. Speech is well enunciated, no aphasia or dysarthria is noted. Extraocular movements are full. Visual fields are full. The tongue is midline, and the patient has symmetric elevation of the soft palate. No obvious hearing deficits are noted.  Motor: The motor testing reveals 5 over 5 strength of all 4 extremities. Good symmetric motor tone is noted throughout.  Sensory: Sensory testing is intact to pinprick, soft touch, vibration sensation, and position sense on all 4 extremities, with exception of a stocking pattern pinprick sensory deficit in the distal half of the legs below the knees. No evidence of extinction is noted.  Coordination: Cerebellar testing reveals good finger-nose-finger and heel-to-shin bilaterally.  Gait and station: Gait is wide-based, unsteady. The patient usually uses a walker for ambulation. Tandem gait was not attempted. Romberg is negative. No drift is seen.  Reflexes: Deep tendon reflexes are symmetric, but are brisk bilaterally. Toes are downgoing bilaterally.   Assessment/Plan:  1. History of cerebrovascular disease  2. Gait disturbance  The patient claims to have had a relatively sudden onset of her walking problem. The CT scan of the brain did not show evidence of an acute stroke,  but it is possible that the patient may have had an event that was missed by CT. The patient is unable to have MRI evaluation secondary to a pacemaker placement. She will be set up for a carotid Doppler study, she will have blood work done today. The patient appears to be diffusely hyperreflexic, I will check a CT scan of the cervical spine to exclude a cervical myelopathy for that reason. The patient will follow-up in 4 months. She remains on low-dose aspirin and takes Plavix.  Jill Alexanders MD 07/23/2016 2:34 PM  [facility] [address] [place], [place] [address]  Phone [phone] Fax [phone]

## 2016-07-23 NOTE — Patient Instructions (Signed)
   We will get blood work today and get CT of the neck and a carotid doppler study.

## 2016-07-24 ENCOUNTER — Telehealth: Payer: Self-pay | Admitting: *Deleted

## 2016-07-24 LAB — RPR: RPR Ser Ql: NONREACTIVE

## 2016-07-24 LAB — SEDIMENTATION RATE: SED RATE: 33 mm/h (ref 0–40)

## 2016-07-24 LAB — VITAMIN B12: Vitamin B-12: 386 pg/mL (ref 232–1245)

## 2016-07-24 NOTE — Telephone Encounter (Signed)
-----   Message from Kathrynn Ducking, MD sent at 07/24/2016  8:05 AM EDT -----   The blood work results are unremarkable. Please call the patient.  ----- Message ----- From: Lavone Neri Lab Results In Sent: 07/24/2016   7:43 AM To: Kathrynn Ducking, MD

## 2016-07-24 NOTE — Telephone Encounter (Signed)
Noted, thank you

## 2016-07-24 NOTE — Telephone Encounter (Signed)
Patient called office and has been advised labs were unremarkable per Dr. Jannifer Franklin.  Patient voiced understanding and had no other questions.

## 2016-07-24 NOTE — Telephone Encounter (Signed)
Called and LVM for pt about unremarkable labs per CW,MD note. Gave [facility] phone number if she has further questions or concerns.

## 2016-07-31 ENCOUNTER — Telehealth: Payer: Self-pay | Admitting: Neurology

## 2016-07-31 NOTE — Telephone Encounter (Signed)
Raquel Sarna- based on CW,MD last office note, pt had a CT head previously. He ordered a CT cervical spine this time. He stated "I will check a CT scan of the cervical spine to exclude a cervical myelopathy for that reason".

## 2016-07-31 NOTE — Telephone Encounter (Signed)
Juliann Pulse with [facility] called in stating the patient said she had already had a CT in [place].. She can't remember what day she had it and she wants to know why she is order to have another one?

## 2016-07-31 NOTE — Telephone Encounter (Signed)
I called the patient she did not answer I left a voicemail stated that Dr. Jannifer Franklin wanted her to have a CT Cervical and that she had a CT head previously. I also stated if she had any questions to give me a call.

## 2016-09-12 NOTE — Progress Notes (Signed)
Electrophysiology Office Note Date: 09/13/2016  ID:  [name], DOB Aug 29, 1933, MRN [id number]  PCP: Randel Books, FNP Electrophysiologist: Caryl Comes  CC: Pacemaker follow-up  [name] is a 83 y.o. female seen today for Dr Caryl Comes.  She presents today for routine electrophysiology followup.  Since last being seen in our clinic, the patient reports doing reasonably well.  She has slight LE edema but no shortness of breath, orthopnea, PND, or chest pain. She denies chest pain, palpitations, nausea, vomiting, dizziness, syncope, edema, weight gain, or early satiety.  Device History: MDT dual chamber PPM implanted 2006 for CHB; gen change 2015   Past Medical History:  Diagnosis Date  . Complete heart block ([name])   . Gait abnormality 07/23/2016  . History of hypertension   . History of stroke   . Hyperhomocysteinemia ([place])   . Hyperlipidemia   . Hypertension   . Hypokalemia   . Pacemaker-Medtronic 08/07/2011   Past Surgical History:  Procedure Laterality Date  . CARDIAC CATHETERIZATION    . PACEMAKER INSERTION     Medtronic Enpulse dual-chamber pacemaker  . PERMANENT PACEMAKER GENERATOR CHANGE N/A 07/30/2013   Procedure: PERMANENT PACEMAKER GENERATOR CHANGE;  Surgeon: [name], MD;  Location: [facility] CATH LAB;  Service: Cardiovascular;  Laterality: N/A;    Current Outpatient Prescriptions  Medication Sig Dispense Refill  . acetaminophen (TYLENOL) 500 MG tablet Take 1,000 mg by mouth every 6 (six) hours as needed.    [name] aspirin 81 MG tablet Take 81 mg by mouth daily.    . clopidogrel (PLAVIX) 75 MG tablet Take 75 mg by mouth daily with breakfast.    . labetalol (NORMODYNE) 200 MG tablet TAKE 1 TABLET (200 MG TOTAL) BY MOUTH 2 (TWO) TIMES DAILY. 180 tablet 2  . simvastatin (ZOCOR) 40 MG tablet Take 1 tablet (40 mg total) by mouth at bedtime. 90 tablet 2   No current facility-administered medications for this visit.     Allergies:   Patient has no known  allergies.   Social History: Social History   Social History  . Marital status: Single    Spouse name: N/A  . Number of children: 3  . Years of education: 12   Occupational History  . Retired    Social History Main Topics  . Smoking status: Former Smoker    Quit date: 07/25/2006  . Smokeless tobacco: Never Used  . Alcohol use No  . Drug use: No  . Sexual activity: Not on file   Other Topics Concern  . Not on file   Social History Narrative   Lives alone   Caffeine use: Coffee daily   Right handed       Family History: Family History  Problem Relation Age of Onset  . Hypertension Mother      Review of Systems: All other systems reviewed and are otherwise negative except as noted above.   Physical Exam: VS:  BP (!) 190/100   Pulse 91   Ht '5\' 2"'$  (1.575 m)   Wt 209 lb (94.8 kg)   SpO2 98%   BMI 38.23 kg/m  , BMI Body mass index is 38.23 kg/m.  GEN- The patient is elderly and obese appearing, alert and oriented x 3 today.   HEENT: normocephalic, atraumatic; sclera clear, conjunctiva pink; hearing intact; oropharynx clear; neck supple  Lungs- Clear to ausculation bilaterally, normal work of breathing.  No wheezes, rales, rhonchi Heart- Regular rate and rhythm (paced) GI- soft, non-tender, non-distended, bowel  sounds present  Extremities- no clubbing, cyanosis, trace BLE edema MS- no significant deformity or atrophy Skin- warm and dry, no rash or lesion; PPM pocket well healed Psych- euthymic mood, full affect Neuro- strength and sensation are intact  PPM Interrogation- reviewed in detail today,  See PACEART report  EKG:  EKG is not ordered today.  Recent Labs: 06/12/2016: ALT 20; BUN 9; Creatinine, Ser 0.69; Hemoglobin 13.5; Platelets 176; Potassium 3.6; Sodium 142   Wt Readings from Last 3 Encounters:  09/13/16 209 lb (94.8 kg)  07/23/16 212 lb (96.2 kg)  06/12/16 215 lb (97.5 kg)     Other studies Reviewed: Additional studies/ records that were  reviewed today include: Dr Olin Pia office notes  Assessment and Plan:  1.  Complete heart block  Normal PPM function - pt is pacemaker dependent today  See Pace Art report No changes today  2.  HTN Stable No change required today  3.  Atrial tachycardia Burden by device interrogation <0.1% No AF to date  Will continue to monitor remotely   Current medicines are reviewed at length with the patient today.   The patient does not have concerns regarding her medicines.  The following changes were made today:  none  Labs/ tests ordered today include: none No orders of the defined types were placed in this encounter.    Disposition:   Follow up with [facility], Dr Caryl Comes 1 year      Signed, Chanetta Harn, NP 09/13/2016 9:57 AM  [place] [address] [phone] (office) [phone] (fax

## 2016-09-13 ENCOUNTER — Ambulatory Visit (INDEPENDENT_AMBULATORY_CARE_PROVIDER_SITE_OTHER): Payer: Medicare Other | Admitting: Nurse Practitioner

## 2016-09-13 ENCOUNTER — Encounter: Payer: Self-pay | Admitting: Nurse Practitioner

## 2016-09-13 VITALS — BP 190/100 | HR 91 | Ht 62.0 in | Wt 209.0 lb

## 2016-09-13 DIAGNOSIS — I471 Supraventricular tachycardia: Secondary | ICD-10-CM

## 2016-09-13 DIAGNOSIS — I442 Atrioventricular block, complete: Secondary | ICD-10-CM

## 2016-09-13 DIAGNOSIS — I10 Essential (primary) hypertension: Secondary | ICD-10-CM

## 2016-09-13 LAB — CUP PACEART INCLINIC DEVICE CHECK
Implantable Lead Model: 5076
Implantable Pulse Generator Implant Date: 20150402
MDC IDC LEAD IMPLANT DT: 20060525
MDC IDC LEAD IMPLANT DT: 20060525
MDC IDC LEAD LOCATION: 753859
MDC IDC LEAD LOCATION: 753860
MDC IDC SESS DTM: 20180517100638

## 2016-09-13 NOTE — Patient Instructions (Signed)
Medication Instructions:  None Ordered   Labwork: None Ordered   Testing/Procedures: None Ordered   Follow-Up: Remote monitoring is used to monitor your Pacemaker from home. This monitoring reduces the number of office visits required to check your device to one time per year. It allows [place] to keep an eye on the functioning of your device to ensure it is working properly. You are scheduled for a device check from home on 12/13/16. You may send your transmission at any time that day. If you have a wireless device, the transmission will be sent automatically. After your physician reviews your transmission, you will receive a postcard with your next transmission date.   Your physician wants you to follow-up in: 1 year with Dr. Caryl Comes. You will receive a reminder letter in the mail two months in advance. If you don't receive a letter, please call our office to schedule the follow-up appointment.   Any Other Special Instructions Will Be Listed Below (If Applicable).     If you need a refill on your cardiac medications before your next appointment, please call your pharmacy.  Thank you for choosing [facility]

## 2016-10-30 ENCOUNTER — Other Ambulatory Visit: Payer: Self-pay | Admitting: Internal Medicine

## 2016-11-16 ENCOUNTER — Other Ambulatory Visit: Payer: Self-pay | Admitting: Internal Medicine

## 2016-11-22 ENCOUNTER — Encounter: Payer: Self-pay | Admitting: Neurology

## 2016-11-22 ENCOUNTER — Ambulatory Visit (INDEPENDENT_AMBULATORY_CARE_PROVIDER_SITE_OTHER): Payer: Medicare Other | Admitting: Neurology

## 2016-11-22 VITALS — BP 165/90 | HR 94 | Ht 62.0 in | Wt 208.5 lb

## 2016-11-22 DIAGNOSIS — R269 Unspecified abnormalities of gait and mobility: Secondary | ICD-10-CM

## 2016-11-22 DIAGNOSIS — R292 Abnormal reflex: Secondary | ICD-10-CM | POA: Diagnosis not present

## 2016-11-22 DIAGNOSIS — I63139 Cerebral infarction due to embolism of unspecified carotid artery: Secondary | ICD-10-CM

## 2016-11-22 NOTE — Progress Notes (Signed)
Reason for visit: Gait disorder  [name] is an 83 y.o. female  History of present illness:  [name] is an 83 year old right-handed black female with a history of a gait disorder. The patient had noted sudden worsening of her walking in January 2018 associated with some nausea and elevated blood pressure. A CT scan of the brain was done at the hospital but did not show an acute stroke, the patient cannot have MRI of the brain secondary to a cardiac pacemaker placement. The patient was seen in March 2018, she was noted to have significant hyperreflexia on all 4 extremities, she was set up for a CT scan of the cervical spine and a carotid Doppler study. These studies were never done. The patient returns for an evaluation. She has had physical therapy with benefit, but her walking returned to her baseline after physical therapy was stopped. The patient uses a walker outside the house, she uses a cane inside the house. She has not had any falls. She does not have any steps or stairs she has to go up and down in the home environment.  Past Medical History:  Diagnosis Date  . Complete heart block ([place])   . Gait abnormality 07/23/2016  . History of hypertension   . History of stroke   . Hyperhomocysteinemia ([place])   . Hyperlipidemia   . Hypertension   . Hypokalemia   . Pacemaker-Medtronic 08/07/2011    Past Surgical History:  Procedure Laterality Date  . CARDIAC CATHETERIZATION    . PACEMAKER INSERTION     Medtronic Enpulse dual-chamber pacemaker  . PERMANENT PACEMAKER GENERATOR CHANGE N/A 07/30/2013   Procedure: PERMANENT PACEMAKER GENERATOR CHANGE;  Surgeon: [name], MD;  Location: [facility] CATH LAB;  Service: Cardiovascular;  Laterality: N/A;    Family History  Problem Relation Age of Onset  . Hypertension Mother     Social history:  reports that she quit smoking about 10 years ago. She has never used smokeless tobacco. She reports that she does not drink alcohol or  use drugs.   No Known Allergies  Medications:  Prior to Admission medications   Medication Sig Start Date End Date Taking? Authorizing Provider  acetaminophen (TYLENOL) 500 MG tablet Take 1,000 mg by mouth every 6 (six) hours as needed.   Yes [provider]  aspirin 81 MG tablet Take 81 mg by mouth daily.   Yes [provider]  clopidogrel (PLAVIX) 75 MG tablet Take 75 mg by mouth daily with breakfast.   Yes [provider]  labetalol (NORMODYNE) 200 MG tablet TAKE 1 TABLET (200 MG TOTAL) BY MOUTH 2 (TWO) TIMES DAILY. 11/16/16  Yes [name], MD  simvastatin (ZOCOR) 40 MG tablet Take 1 tablet (40 mg total) by mouth at bedtime. 11/01/16  Yes [name], MD    ROS:  Out of a complete 14 system review of symptoms, the patient complains only of the following symptoms, and all other reviewed systems are negative.  Gait disorder  Blood pressure (!) 165/90, pulse 94, height 5\' 2"  (1.575 m), weight 208 lb 8 oz (94.6 kg).  Physical Exam  General: The patient is alert and cooperative at the time of the examination. The patient is markedly obese.  Skin: 1+ edema to ankles is noted bilaterally..   Neurologic Exam  Mental status: The patient is alert and oriented x 3 at the time of the examination. The patient has apparent normal recent and remote memory, with an  apparently normal attention span and concentration ability.   Cranial nerves: Facial symmetry is present. Speech is normal, no aphasia or dysarthria is noted. Extraocular movements are full. Visual fields are full.  Motor: The patient has good strength in all 4 extremities.  Sensory examination: Soft touch sensation is symmetric on the face, arms, and legs.  Coordination: The patient has good finger-nose-finger and heel-to-shin bilaterally.  Gait and station: The patient has a slightly wide-based gait, the patient can walk with assistance or with a walker. Tandem gait was not attempted.  Romberg is negative. No drift is seen.  Reflexes: Deep tendon reflexes are symmetric, but are significantly brisk throughout.   Assessment/Plan:  1. Gait disturbance  2. Diffuse hyperreflexia  The patient will once again be set up for a carotid Doppler study. The patient has diffuse hyperreflexia that could be related to small vessel disease or possibly related to a cervical myelopathy. The patient will undergo a CT scan of the cervical spine to exclude spinal cord compression. The patient will follow-up in 6 months. She has been stable with her ability to ambulate since seen last in March 2018.  Suzanne Alexanders MD 11/22/2016 12:38 PM  [facility] [address] [place], [place] [address]  Phone [phone] Fax [phone]

## 2016-11-22 NOTE — Patient Instructions (Signed)
   We will do a carotid doppler to look at the circulation to the brain and do a CT of the neck.

## 2016-11-26 ENCOUNTER — Telehealth: Payer: Self-pay | Admitting: Neurology

## 2016-11-26 NOTE — Telephone Encounter (Signed)
Patient called office in reference to scheduling CT/US.  Patient wants these done at [facility] not [facility].  Please call

## 2016-11-28 NOTE — Telephone Encounter (Signed)
I spoke with the patient regarding the CT and informed her that she would have to have results sent over from [facility]. She said she would rather just come here. I gave her the number to [place].

## 2016-12-05 ENCOUNTER — Telehealth: Payer: Self-pay | Admitting: Neurology

## 2016-12-05 ENCOUNTER — Ambulatory Visit
Admission: RE | Admit: 2016-12-05 | Discharge: 2016-12-05 | Disposition: A | Discharge status: Home / Self Care | Payer: Medicare Other | Source: Ambulatory Visit | Attending: Neurology | Admitting: Neurology

## 2016-12-05 DIAGNOSIS — I63139 Cerebral infarction due to embolism of unspecified carotid artery: Secondary | ICD-10-CM

## 2016-12-05 DIAGNOSIS — R292 Abnormal reflex: Secondary | ICD-10-CM

## 2016-12-05 DIAGNOSIS — R269 Unspecified abnormalities of gait and mobility: Secondary | ICD-10-CM

## 2016-12-05 NOTE — Telephone Encounter (Signed)
I called patient. The CT scan of the cervical spine did not show evidence of cervical spinal cord compression, nothing to explain the hyperreflexia seen on examination. Carotid Doppler study is unremarkable, she will remain on aspirin and Plavix.    CT cervical 12/05/16:  IMPRESSION: 1. Degenerative cervical spondylosis with multilevel disc disease and facet disease as discussed above. 2. No obvious large disc protrusions, significant spinal or foraminal stenosis. Mild bilateral foraminal stenosis at C6-7. 3. No acute bony findings.   Carotid doppler 12/05/16:  IMPRESSION: Less than 50% stenosis in the right and left internal carotid arteries.

## 2016-12-13 ENCOUNTER — Encounter: Payer: Medicare Other | Admitting: *Deleted

## 2016-12-19 ENCOUNTER — Encounter: Payer: Self-pay | Admitting: Cardiology

## 2016-12-25 ENCOUNTER — Ambulatory Visit (INDEPENDENT_AMBULATORY_CARE_PROVIDER_SITE_OTHER): Payer: Medicare Other | Admitting: *Deleted

## 2016-12-25 DIAGNOSIS — I442 Atrioventricular block, complete: Secondary | ICD-10-CM

## 2016-12-26 LAB — CUP PACEART REMOTE DEVICE CHECK
Battery Impedance: 233 Ohm
Battery Voltage: 2.78 V
Brady Statistic AP VP Percent: 42 %
Brady Statistic AP VS Percent: 0 %
Brady Statistic AS VP Percent: 58 %
Date Time Interrogation Session: 20180828163910
Implantable Lead Implant Date: 20060525
Implantable Lead Implant Date: 20060525
Implantable Lead Location: 753860
Implantable Lead Model: 5076
Lead Channel Impedance Value: 495 Ohm
Lead Channel Pacing Threshold Amplitude: 1 V
Lead Channel Pacing Threshold Pulse Width: 0.4 ms
Lead Channel Setting Pacing Amplitude: 2 V
Lead Channel Setting Sensing Sensitivity: 2.8 mV
MDC IDC LEAD LOCATION: 753859
MDC IDC MSMT BATTERY REMAINING LONGEVITY: 105 mo
MDC IDC MSMT LEADCHNL RA IMPEDANCE VALUE: 465 Ohm
MDC IDC MSMT LEADCHNL RA PACING THRESHOLD AMPLITUDE: 0.625 V
MDC IDC MSMT LEADCHNL RA PACING THRESHOLD PULSEWIDTH: 0.4 ms
MDC IDC PG IMPLANT DT: 20150402
MDC IDC SET LEADCHNL RV PACING AMPLITUDE: 2.5 V
MDC IDC SET LEADCHNL RV PACING PULSEWIDTH: 0.4 ms
MDC IDC STAT BRADY AS VS PERCENT: 0 %

## 2016-12-26 NOTE — Progress Notes (Signed)
Remote transmission

## 2017-01-04 ENCOUNTER — Encounter: Payer: Self-pay | Admitting: Cardiology

## 2017-03-26 ENCOUNTER — Telehealth: Payer: Self-pay | Admitting: Cardiology

## 2017-03-26 ENCOUNTER — Ambulatory Visit (INDEPENDENT_AMBULATORY_CARE_PROVIDER_SITE_OTHER): Payer: Medicare Other | Admitting: *Deleted

## 2017-03-26 DIAGNOSIS — I442 Atrioventricular block, complete: Secondary | ICD-10-CM | POA: Diagnosis not present

## 2017-03-26 NOTE — Telephone Encounter (Signed)
LMOVM reminding pt to send remote transmission.   

## 2017-03-27 NOTE — Progress Notes (Signed)
Remote pacemaker transmission.   

## 2017-03-29 ENCOUNTER — Encounter: Payer: Self-pay | Admitting: Cardiology

## 2017-03-29 LAB — CUP PACEART REMOTE DEVICE CHECK
Battery Impedance: 257 Ohm
Battery Remaining Longevity: 102 mo
Battery Voltage: 2.77 V
Brady Statistic AP VP Percent: 38 %
Brady Statistic AP VS Percent: 0 %
Brady Statistic AS VP Percent: 62 %
Brady Statistic AS VS Percent: 0 %
Date Time Interrogation Session: 20181127183640
Implantable Lead Implant Date: 20060525
Implantable Lead Implant Date: 20060525
Implantable Lead Location: 753859
Implantable Lead Location: 753860
Implantable Lead Model: 5076
Implantable Lead Model: 5076
Implantable Pulse Generator Implant Date: 20150402
Lead Channel Impedance Value: 472 Ohm
Lead Channel Impedance Value: 493 Ohm
Lead Channel Pacing Threshold Amplitude: 0.625 V
Lead Channel Pacing Threshold Amplitude: 1 V
Lead Channel Pacing Threshold Pulse Width: 0.4 ms
Lead Channel Pacing Threshold Pulse Width: 0.4 ms
Lead Channel Sensing Intrinsic Amplitude: 1.4 mV
Lead Channel Setting Pacing Amplitude: 2 V
Lead Channel Setting Pacing Amplitude: 2.5 V
Lead Channel Setting Pacing Pulse Width: 0.4 ms
Lead Channel Setting Sensing Sensitivity: 2.8 mV

## 2017-06-03 ENCOUNTER — Encounter: Payer: Self-pay | Admitting: Neurology

## 2017-06-03 ENCOUNTER — Ambulatory Visit (INDEPENDENT_AMBULATORY_CARE_PROVIDER_SITE_OTHER): Payer: Medicare Other | Admitting: Neurology

## 2017-06-03 VITALS — BP 228/115 | HR 85 | Ht 62.0 in | Wt 206.5 lb

## 2017-06-03 DIAGNOSIS — R269 Unspecified abnormalities of gait and mobility: Secondary | ICD-10-CM

## 2017-06-03 NOTE — Progress Notes (Signed)
Reason for visit: Gait disorder  [name] is an 84 y.o. female  History of present illness:  [name] is an 84 year old right-handed black female with a history of a gait disorder.  The patient has undergone blood work, she has had a CT scan of the brain and CT of the cervical spine.  A carotid Doppler study was also done.  The CT of the cervical spine did not show any spinal cord compression that would explain her walking problems and hyperreflexia.  A carotid Doppler study was unremarkable.  Prior MRI and CT evaluations have shown evidence of cerebrovascular disease that affects the periventricular white matter, bilateral thalamic areas, right posterior limb of the internal capsule, and brainstem and cerebellum.  The patient has been walking with a walker, she has not had any falls since last seen.  She does have a history of significant hypertension.  The patient has not noted any significant progression of her walking problem since last seen.  Past Medical History:  Diagnosis Date  . Complete heart block ([place])   . Gait abnormality 07/23/2016  . History of hypertension   . History of stroke   . Hyperhomocysteinemia ([place])   . Hyperlipidemia   . Hypertension   . Hypokalemia   . Pacemaker-Medtronic 08/07/2011    Past Surgical History:  Procedure Laterality Date  . CARDIAC CATHETERIZATION    . PACEMAKER INSERTION     Medtronic Enpulse dual-chamber pacemaker  . PERMANENT PACEMAKER GENERATOR CHANGE N/A 07/30/2013   Procedure: PERMANENT PACEMAKER GENERATOR CHANGE;  Surgeon: [name], MD;  Location: [facility] CATH LAB;  Service: Cardiovascular;  Laterality: N/A;    Family History  Problem Relation Age of Onset  . Hypertension Mother     Social history:  reports that she quit smoking about 10 years ago. she has never used smokeless tobacco. She reports that she does not drink alcohol or use drugs.   No Known Allergies  Medications:  Prior to Admission medications     Medication Sig Start Date End Date Taking? Authorizing Provider  acetaminophen (TYLENOL) 500 MG tablet Take 1,000 mg by mouth every 6 (six) hours as needed.   Yes [provider]  aspirin 81 MG tablet Take 81 mg by mouth daily.   Yes [provider]  clopidogrel (PLAVIX) 75 MG tablet Take 75 mg by mouth daily with breakfast.   Yes [provider]  labetalol (NORMODYNE) 200 MG tablet TAKE 1 TABLET (200 MG TOTAL) BY MOUTH 2 (TWO) TIMES DAILY. 11/16/16  Yes [name], MD  simvastatin (ZOCOR) 40 MG tablet Take 1 tablet (40 mg total) by mouth at bedtime. 11/01/16  Yes [name], MD    ROS:  Out of a complete 14 system review of symptoms, the patient complains only of the following symptoms, and all other reviewed systems are negative.  Gait disorder  Blood pressure (!) 228/115, pulse 85, height 5\' 2"  (1.575 m), weight 206 lb 8 oz (93.7 kg).   Repeat blood pressure, right arm, sitting is 180/90.  Physical Exam  General: The patient is alert and cooperative at the time of the examination.  The patient is moderately to markedly obese.  Skin: No significant peripheral edema is noted.   Neurologic Exam  Mental status: The patient is alert and oriented x 3 at the time of the examination. The patient has apparent normal recent and remote memory, with an apparently normal attention span and concentration ability.   Cranial  nerves: Facial symmetry is present. Speech is normal, no aphasia or dysarthria is noted. Extraocular movements are full. Visual fields are full.  Motor: The patient has good strength in all 4 extremities.  Sensory examination: Soft touch sensation is symmetric on the face, arms, and legs.  Coordination: The patient has good finger-nose-finger and heel-to-shin bilaterally.  Some apraxia with use of the lower extremities is noted.  Gait and station: The patient has a wide-based gait, the patient can walk with a walker, she has good  stride and good turns with a walker.  Tandem gait was not attempted.  Romberg is negative.  Reflexes: Deep tendon reflexes are symmetric, but reflexes are brisk throughout.   Assessment/Plan:  1.  Gait disorder, hyperreflexia  2.  Cerebrovascular disease  The source of the gait disorder likely is based upon the cerebrovascular disease.  The patient has cortical, subcortical, thalamic, and brainstem infarcts that likely explain her gait instability and hyperreflexia.  The patient comes in today with an extremely elevated blood pressure, aggressive management of this may slow down progression of the small vessel disease and help preserve her gait stability, memory, and bladder control in the future.  The patient is on low-dose aspirin and Plavix.  She will follow-up through this office if needed.  Jill Alexanders MD 06/03/2017 12:52 PM  [facility] [address] [place], [place] [address]  Phone [phone] Fax [phone]

## 2017-06-03 NOTE — Patient Instructions (Signed)
   Need to monitor the blood pressure closely at home. Report to your doctor if the blood pressure is remaining above 168 systolic (upper number).

## 2017-06-25 ENCOUNTER — Ambulatory Visit (INDEPENDENT_AMBULATORY_CARE_PROVIDER_SITE_OTHER): Payer: Medicare Other | Admitting: *Deleted

## 2017-06-25 DIAGNOSIS — I442 Atrioventricular block, complete: Secondary | ICD-10-CM

## 2017-06-26 NOTE — Progress Notes (Signed)
Remote pacemaker transmission.   

## 2017-06-27 ENCOUNTER — Encounter: Payer: Self-pay | Admitting: Cardiology

## 2017-07-10 LAB — CUP PACEART REMOTE DEVICE CHECK
Battery Impedance: 282 Ohm
Brady Statistic AP VP Percent: 36 %
Brady Statistic AP VS Percent: 0 %
Brady Statistic AS VS Percent: 0 %
Date Time Interrogation Session: 20190226152538
Implantable Lead Implant Date: 20060525
Implantable Lead Location: 753859
Implantable Lead Model: 5076
Lead Channel Impedance Value: 452 Ohm
Lead Channel Impedance Value: 476 Ohm
Lead Channel Pacing Threshold Amplitude: 1.125 V
Lead Channel Pacing Threshold Pulse Width: 0.4 ms
Lead Channel Pacing Threshold Pulse Width: 0.4 ms
Lead Channel Setting Pacing Amplitude: 2.5 V
MDC IDC LEAD IMPLANT DT: 20060525
MDC IDC LEAD LOCATION: 753860
MDC IDC MSMT BATTERY REMAINING LONGEVITY: 99 mo
MDC IDC MSMT BATTERY VOLTAGE: 2.77 V
MDC IDC MSMT LEADCHNL RA PACING THRESHOLD AMPLITUDE: 0.5 V
MDC IDC PG IMPLANT DT: 20150402
MDC IDC SET LEADCHNL RA PACING AMPLITUDE: 2 V
MDC IDC SET LEADCHNL RV PACING PULSEWIDTH: 0.4 ms
MDC IDC SET LEADCHNL RV SENSING SENSITIVITY: 2.8 mV
MDC IDC STAT BRADY AS VP PERCENT: 64 %

## 2017-08-28 ENCOUNTER — Encounter: Payer: Self-pay | Admitting: Internal Medicine

## 2017-09-07 ENCOUNTER — Other Ambulatory Visit: Payer: Self-pay | Admitting: Internal Medicine

## 2017-09-18 ENCOUNTER — Encounter: Payer: Self-pay | Admitting: Internal Medicine

## 2017-09-18 ENCOUNTER — Ambulatory Visit (INDEPENDENT_AMBULATORY_CARE_PROVIDER_SITE_OTHER): Payer: Medicare Other | Admitting: Internal Medicine

## 2017-09-18 VITALS — BP 142/90 | HR 72 | Ht 62.0 in | Wt 203.0 lb

## 2017-09-18 DIAGNOSIS — I442 Atrioventricular block, complete: Secondary | ICD-10-CM

## 2017-09-18 DIAGNOSIS — I10 Essential (primary) hypertension: Secondary | ICD-10-CM | POA: Diagnosis not present

## 2017-09-18 DIAGNOSIS — Z950 Presence of cardiac pacemaker: Secondary | ICD-10-CM

## 2017-09-18 DIAGNOSIS — I471 Supraventricular tachycardia: Secondary | ICD-10-CM

## 2017-09-18 LAB — CUP PACEART INCLINIC DEVICE CHECK
Battery Impedance: 306 Ohm
Battery Remaining Longevity: 97 mo
Battery Voltage: 2.78 V
Brady Statistic AP VP Percent: 38 %
Brady Statistic AP VS Percent: 0 %
Brady Statistic AS VP Percent: 62 %
Brady Statistic AS VS Percent: 0 %
Date Time Interrogation Session: 20190522102423
Implantable Lead Implant Date: 20060525
Implantable Lead Implant Date: 20060525
Implantable Lead Location: 753859
Implantable Lead Location: 753860
Implantable Lead Model: 5076
Implantable Lead Model: 5076
Implantable Pulse Generator Implant Date: 20150402
Lead Channel Impedance Value: 500 Ohm
Lead Channel Impedance Value: 505 Ohm
Lead Channel Pacing Threshold Amplitude: 0.75 V
Lead Channel Pacing Threshold Amplitude: 1.25 V
Lead Channel Pacing Threshold Pulse Width: 0.4 ms
Lead Channel Pacing Threshold Pulse Width: 0.4 ms
Lead Channel Sensing Intrinsic Amplitude: 2 mV
Lead Channel Setting Pacing Amplitude: 2 V
Lead Channel Setting Pacing Amplitude: 2.5 V
Lead Channel Setting Pacing Pulse Width: 0.4 ms
Lead Channel Setting Sensing Sensitivity: 2.8 mV

## 2017-09-18 NOTE — Patient Instructions (Signed)
Medication Instructions:  Your physician recommends that you continue on your current medications as directed. Please refer to the Current Medication list given to you today.  Labwork: None ordered.  Testing/Procedures: None ordered.  Follow-Up: Your physician wants you to follow-up in: One Year with Chanetta Darnold, NP. You will receive a reminder letter in the mail two months in advance. If you don't receive a letter, please call our office to schedule the follow-up appointment.  Remote monitoring is used to monitor your Pacemaker of ICD from home. This monitoring reduces the number of office visits required to check your device to one time per year. It allows [place] to keep an eye on the functioning of your device to ensure it is working properly. You are scheduled for a device check from home on 5/29. You may send your transmission at any time that day. If you have a wireless device, the transmission will be sent automatically. After your physician reviews your transmission, you will receive a postcard with your next transmission date.    Any Other Special Instructions Will Be Listed Below (If Applicable).     If you need a refill on your cardiac medications before your next appointment, please call your pharmacy.

## 2017-09-18 NOTE — Progress Notes (Signed)
skf      Patient Care Team: Randel Books, FNP as PCP - General (Family Medicine)   HPI  [name] is a 84 y.o. female Seen in followup for  pacemaker implanted remotely for complete heart block. She underwent device generator replacement 4/15.  She is a prior stroke and history of hypertension.  The patient denies chest pain  nocturnal dyspnea, orthopnea or peripheral edema.  There have been no palpitations, lightheadedness or syncope.  Chronic shortness of breath but unchanged   Blood work per PCP  Past Medical History:  Diagnosis Date  . Complete heart block ([place])   . Gait abnormality 07/23/2016  . History of hypertension   . History of stroke   . Hyperhomocysteinemia ([place])   . Hyperlipidemia   . Hypertension   . Hypokalemia   . Pacemaker-Medtronic 08/07/2011    Past Surgical History:  Procedure Laterality Date  . CARDIAC CATHETERIZATION    . PACEMAKER INSERTION     Medtronic Enpulse dual-chamber pacemaker  . PERMANENT PACEMAKER GENERATOR CHANGE N/A 07/30/2013   Procedure: PERMANENT PACEMAKER GENERATOR CHANGE;  Surgeon: [name], MD;  Location: [facility] CATH LAB;  Service: Cardiovascular;  Laterality: N/A;    Current Outpatient Medications  Medication Sig Dispense Refill  . acetaminophen (TYLENOL) 500 MG tablet Take 1,000 mg by mouth every 6 (six) hours as needed.    [name] aspirin 81 MG tablet Take 81 mg by mouth daily.    . clopidogrel (PLAVIX) 75 MG tablet Take 75 mg by mouth daily with breakfast.    . hydrochlorothiazide (HYDRODIURIL) 25 MG tablet Take 25 mg by mouth daily.  1  . labetalol (NORMODYNE) 200 MG tablet Take 1 tablet (200 mg total) by mouth 2 (two) times daily. Please keep upcoming appt for future refills. Thank you 180 tablet 0  . simvastatin (ZOCOR) 40 MG tablet Take 1 tablet (40 mg total) by mouth at bedtime. 90 tablet 3   No current facility-administered medications for this visit.     No Known Allergies  Review of Systems negative  except from HPI and PMH  Physical Exam BP (!) 142/90   Pulse 72   Ht 5\' 2"  (1.575 m)   Wt 203 lb (92.1 kg)   SpO2 96%   BMI 37.13 kg/m  Well developed and nourished in no acute distress HENT normal Neck supple with JVP-flat Clear Device pocket well healed; without hematoma or erythema.  There is no tethering  Regular rate and rhythm, no murmurs or gallops Abd-soft with active BS No Clubbing cyanosis edema Skin-warm and dry A & Oriented  Grossly normal sensory and motor function    ECG demonstrates  P-synchronous/ AV  pacing  Assessment and  Plan  Hypertension-   Complete heart block  Lipids      CVA    Pacemaker-Medtronic  The patient's device was interrogated and the information was fully reviewed.  The device was reprogrammed to decrase the rate tracking   BP well controlled    No AFib but atrial tachycardia  Device rep

## 2017-09-25 ENCOUNTER — Ambulatory Visit (INDEPENDENT_AMBULATORY_CARE_PROVIDER_SITE_OTHER): Payer: Medicare Other | Admitting: *Deleted

## 2017-09-25 DIAGNOSIS — I442 Atrioventricular block, complete: Secondary | ICD-10-CM

## 2017-09-25 DIAGNOSIS — I495 Sick sinus syndrome: Secondary | ICD-10-CM | POA: Diagnosis not present

## 2017-09-25 NOTE — Progress Notes (Signed)
Remote pacemaker transmission.   

## 2017-09-27 LAB — CUP PACEART REMOTE DEVICE CHECK
Battery Voltage: 2.78 V
Brady Statistic AP VS Percent: 0 %
Brady Statistic AS VS Percent: 0 %
Date Time Interrogation Session: 20190529130531
Implantable Lead Location: 753860
Lead Channel Impedance Value: 490 Ohm
Lead Channel Pacing Threshold Amplitude: 1.25 V
Lead Channel Pacing Threshold Pulse Width: 0.4 ms
Lead Channel Pacing Threshold Pulse Width: 0.4 ms
Lead Channel Setting Pacing Pulse Width: 0.4 ms
MDC IDC LEAD IMPLANT DT: 20060525
MDC IDC LEAD IMPLANT DT: 20060525
MDC IDC LEAD LOCATION: 753859
MDC IDC MSMT BATTERY IMPEDANCE: 331 Ohm
MDC IDC MSMT BATTERY REMAINING LONGEVITY: 95 mo
MDC IDC MSMT LEADCHNL RA IMPEDANCE VALUE: 479 Ohm
MDC IDC MSMT LEADCHNL RA PACING THRESHOLD AMPLITUDE: 0.625 V
MDC IDC PG IMPLANT DT: 20150402
MDC IDC SET LEADCHNL RA PACING AMPLITUDE: 2 V
MDC IDC SET LEADCHNL RV PACING AMPLITUDE: 2.5 V
MDC IDC SET LEADCHNL RV SENSING SENSITIVITY: 2.8 mV
MDC IDC STAT BRADY AP VP PERCENT: 51 %
MDC IDC STAT BRADY AS VP PERCENT: 49 %

## 2017-10-25 ENCOUNTER — Other Ambulatory Visit: Payer: Self-pay

## 2017-10-25 MED ORDER — SIMVASTATIN 40 MG PO TABS: 40.0000 mg | ORAL_TABLET | Freq: Every day | ORAL | 3 refills | Status: DC

## 2017-12-04 ENCOUNTER — Other Ambulatory Visit: Payer: Self-pay | Admitting: Internal Medicine

## 2017-12-25 ENCOUNTER — Telehealth: Payer: Self-pay

## 2017-12-25 ENCOUNTER — Ambulatory Visit (INDEPENDENT_AMBULATORY_CARE_PROVIDER_SITE_OTHER): Payer: Medicare Other | Admitting: *Deleted

## 2017-12-25 DIAGNOSIS — I495 Sick sinus syndrome: Secondary | ICD-10-CM | POA: Diagnosis not present

## 2017-12-25 NOTE — Progress Notes (Signed)
Remote pacemaker transmission.   

## 2017-12-25 NOTE — Telephone Encounter (Signed)
Spoke with pt and reminded pt of remote transmission that is due today. Pt verbalized understanding.   

## 2017-12-26 ENCOUNTER — Encounter: Payer: Self-pay | Admitting: Cardiology

## 2018-01-14 LAB — CUP PACEART REMOTE DEVICE CHECK
Battery Remaining Longevity: 70 mo
Brady Statistic AS VP Percent: 47 %
Implantable Lead Implant Date: 20060525
Implantable Lead Location: 753860
Lead Channel Pacing Threshold Amplitude: 1.5 V
Lead Channel Pacing Threshold Pulse Width: 0.4 ms
Lead Channel Setting Pacing Amplitude: 2 V
Lead Channel Setting Sensing Sensitivity: 2.8 mV
MDC IDC LEAD IMPLANT DT: 20060525
MDC IDC LEAD LOCATION: 753859
MDC IDC MSMT BATTERY IMPEDANCE: 380 Ohm
MDC IDC MSMT BATTERY VOLTAGE: 2.76 V
MDC IDC MSMT LEADCHNL RA IMPEDANCE VALUE: 479 Ohm
MDC IDC MSMT LEADCHNL RA PACING THRESHOLD AMPLITUDE: 0.625 V
MDC IDC MSMT LEADCHNL RA PACING THRESHOLD PULSEWIDTH: 0.4 ms
MDC IDC MSMT LEADCHNL RV IMPEDANCE VALUE: 497 Ohm
MDC IDC PG IMPLANT DT: 20150402
MDC IDC SESS DTM: 20190828150523
MDC IDC SET LEADCHNL RV PACING AMPLITUDE: 3.75 V
MDC IDC SET LEADCHNL RV PACING PULSEWIDTH: 0.4 ms
MDC IDC STAT BRADY AP VP PERCENT: 53 %
MDC IDC STAT BRADY AP VS PERCENT: 0 %
MDC IDC STAT BRADY AS VS PERCENT: 0 %

## 2018-03-26 ENCOUNTER — Telehealth: Payer: Self-pay | Admitting: Cardiology

## 2018-03-26 NOTE — Telephone Encounter (Signed)
Attempted to confirm remote transmission with pt. No answer and was unable to leave a message.   

## 2018-04-03 ENCOUNTER — Encounter: Payer: Self-pay | Admitting: Cardiology

## 2018-04-03 NOTE — Progress Notes (Signed)
Letter  

## 2018-04-09 ENCOUNTER — Ambulatory Visit (INDEPENDENT_AMBULATORY_CARE_PROVIDER_SITE_OTHER): Payer: Medicare Other

## 2018-04-09 DIAGNOSIS — I495 Sick sinus syndrome: Secondary | ICD-10-CM | POA: Diagnosis not present

## 2018-04-09 DIAGNOSIS — I442 Atrioventricular block, complete: Secondary | ICD-10-CM

## 2018-04-10 NOTE — Progress Notes (Signed)
Remote pacemaker transmission.   

## 2018-04-11 ENCOUNTER — Encounter: Payer: Self-pay | Admitting: Cardiology

## 2018-05-24 LAB — CUP PACEART REMOTE DEVICE CHECK
Brady Statistic AP VP Percent: 47 %
Brady Statistic AS VS Percent: 0 %
Date Time Interrogation Session: 20191211152759
Implantable Lead Implant Date: 20060525
Implantable Lead Implant Date: 20060525
Implantable Lead Location: 753860
Implantable Pulse Generator Implant Date: 20150402
Lead Channel Impedance Value: 479 Ohm
Lead Channel Impedance Value: 492 Ohm
Lead Channel Pacing Threshold Amplitude: 1.625 V
Lead Channel Pacing Threshold Pulse Width: 0.4 ms
Lead Channel Setting Pacing Amplitude: 2 V
Lead Channel Setting Sensing Sensitivity: 2.8 mV
MDC IDC LEAD LOCATION: 753859
MDC IDC MSMT BATTERY IMPEDANCE: 454 Ohm
MDC IDC MSMT BATTERY REMAINING LONGEVITY: 69 mo
MDC IDC MSMT BATTERY VOLTAGE: 2.76 V
MDC IDC MSMT LEADCHNL RA PACING THRESHOLD AMPLITUDE: 0.75 V
MDC IDC MSMT LEADCHNL RA PACING THRESHOLD PULSEWIDTH: 0.4 ms
MDC IDC SET LEADCHNL RV PACING AMPLITUDE: 3.25 V
MDC IDC SET LEADCHNL RV PACING PULSEWIDTH: 0.52 ms
MDC IDC STAT BRADY AP VS PERCENT: 0 %
MDC IDC STAT BRADY AS VP PERCENT: 53 %

## 2018-05-26 ENCOUNTER — Inpatient Hospital Stay (HOSPITAL_COMMUNITY)
Admission: EM | Admit: 2018-05-26 | Discharge: 2018-05-31 | DRG: 872 | Disposition: A | Discharge status: Skilled Nursing Facility | Payer: Medicare Other | Attending: Internal Medicine | Admitting: Internal Medicine

## 2018-05-26 ENCOUNTER — Emergency Department (HOSPITAL_COMMUNITY): Payer: Medicare Other

## 2018-05-26 ENCOUNTER — Encounter (HOSPITAL_COMMUNITY): Payer: Self-pay | Admitting: Internal Medicine

## 2018-05-26 DIAGNOSIS — R740 Nonspecific elevation of levels of transaminase and lactic acid dehydrogenase [LDH]: Secondary | ICD-10-CM

## 2018-05-26 DIAGNOSIS — R6520 Severe sepsis without septic shock: Secondary | ICD-10-CM | POA: Diagnosis present

## 2018-05-26 DIAGNOSIS — Z950 Presence of cardiac pacemaker: Secondary | ICD-10-CM

## 2018-05-26 DIAGNOSIS — Z87891 Personal history of nicotine dependence: Secondary | ICD-10-CM

## 2018-05-26 DIAGNOSIS — I10 Essential (primary) hypertension: Secondary | ICD-10-CM | POA: Diagnosis not present

## 2018-05-26 DIAGNOSIS — K8020 Calculus of gallbladder without cholecystitis without obstruction: Secondary | ICD-10-CM | POA: Diagnosis present

## 2018-05-26 DIAGNOSIS — A4159 Other Gram-negative sepsis: Principal | ICD-10-CM | POA: Diagnosis present

## 2018-05-26 DIAGNOSIS — E876 Hypokalemia: Secondary | ICD-10-CM | POA: Diagnosis not present

## 2018-05-26 DIAGNOSIS — R531 Weakness: Secondary | ICD-10-CM | POA: Diagnosis not present

## 2018-05-26 DIAGNOSIS — I69393 Ataxia following cerebral infarction: Secondary | ICD-10-CM | POA: Diagnosis not present

## 2018-05-26 DIAGNOSIS — I110 Hypertensive heart disease with heart failure: Secondary | ICD-10-CM | POA: Diagnosis present

## 2018-05-26 DIAGNOSIS — Z7902 Long term (current) use of antithrombotics/antiplatelets: Secondary | ICD-10-CM

## 2018-05-26 DIAGNOSIS — I69328 Other speech and language deficits following cerebral infarction: Secondary | ICD-10-CM

## 2018-05-26 DIAGNOSIS — E860 Dehydration: Secondary | ICD-10-CM | POA: Diagnosis present

## 2018-05-26 DIAGNOSIS — R296 Repeated falls: Secondary | ICD-10-CM | POA: Diagnosis present

## 2018-05-26 DIAGNOSIS — R4781 Slurred speech: Secondary | ICD-10-CM | POA: Diagnosis present

## 2018-05-26 DIAGNOSIS — M6282 Rhabdomyolysis: Secondary | ICD-10-CM | POA: Diagnosis present

## 2018-05-26 DIAGNOSIS — R7401 Elevation of levels of liver transaminase levels: Secondary | ICD-10-CM

## 2018-05-26 DIAGNOSIS — M19011 Primary osteoarthritis, right shoulder: Secondary | ICD-10-CM | POA: Diagnosis present

## 2018-05-26 DIAGNOSIS — M25511 Pain in right shoulder: Secondary | ICD-10-CM | POA: Diagnosis present

## 2018-05-26 DIAGNOSIS — R29810 Facial weakness: Secondary | ICD-10-CM | POA: Diagnosis present

## 2018-05-26 DIAGNOSIS — Z6837 Body mass index (BMI) 37.0-37.9, adult: Secondary | ICD-10-CM | POA: Diagnosis not present

## 2018-05-26 DIAGNOSIS — I69354 Hemiplegia and hemiparesis following cerebral infarction affecting left non-dominant side: Secondary | ICD-10-CM

## 2018-05-26 DIAGNOSIS — E669 Obesity, unspecified: Secondary | ICD-10-CM | POA: Diagnosis present

## 2018-05-26 DIAGNOSIS — I442 Atrioventricular block, complete: Secondary | ICD-10-CM | POA: Diagnosis present

## 2018-05-26 DIAGNOSIS — R945 Abnormal results of liver function studies: Secondary | ICD-10-CM | POA: Diagnosis not present

## 2018-05-26 DIAGNOSIS — R778 Other specified abnormalities of plasma proteins: Secondary | ICD-10-CM

## 2018-05-26 DIAGNOSIS — I509 Heart failure, unspecified: Secondary | ICD-10-CM | POA: Diagnosis present

## 2018-05-26 DIAGNOSIS — R7989 Other specified abnormal findings of blood chemistry: Secondary | ICD-10-CM

## 2018-05-26 DIAGNOSIS — I248 Other forms of acute ischemic heart disease: Secondary | ICD-10-CM | POA: Diagnosis present

## 2018-05-26 DIAGNOSIS — N390 Urinary tract infection, site not specified: Secondary | ICD-10-CM | POA: Diagnosis present

## 2018-05-26 DIAGNOSIS — Z8673 Personal history of transient ischemic attack (TIA), and cerebral infarction without residual deficits: Secondary | ICD-10-CM

## 2018-05-26 DIAGNOSIS — I639 Cerebral infarction, unspecified: Secondary | ICD-10-CM

## 2018-05-26 DIAGNOSIS — N179 Acute kidney failure, unspecified: Secondary | ICD-10-CM | POA: Diagnosis not present

## 2018-05-26 DIAGNOSIS — E785 Hyperlipidemia, unspecified: Secondary | ICD-10-CM | POA: Diagnosis present

## 2018-05-26 DIAGNOSIS — I6389 Other cerebral infarction: Secondary | ICD-10-CM | POA: Diagnosis not present

## 2018-05-26 DIAGNOSIS — Z8249 Family history of ischemic heart disease and other diseases of the circulatory system: Secondary | ICD-10-CM

## 2018-05-26 DIAGNOSIS — R0602 Shortness of breath: Secondary | ICD-10-CM

## 2018-05-26 DIAGNOSIS — Z79899 Other long term (current) drug therapy: Secondary | ICD-10-CM

## 2018-05-26 DIAGNOSIS — Z7982 Long term (current) use of aspirin: Secondary | ICD-10-CM

## 2018-05-26 LAB — COMPREHENSIVE METABOLIC PANEL
ALK PHOS: 52 U/L (ref 38–126)
ALT: 170 U/L — ABNORMAL HIGH (ref 0–44)
AST: 446 U/L — ABNORMAL HIGH (ref 15–41)
Albumin: 3 g/dL — ABNORMAL LOW (ref 3.5–5.0)
Anion gap: 21 — ABNORMAL HIGH (ref 5–15)
BUN: 50 mg/dL — ABNORMAL HIGH (ref 8–23)
CO2: 19 mmol/L — AB (ref 22–32)
Calcium: 8.6 mg/dL — ABNORMAL LOW (ref 8.9–10.3)
Chloride: 101 mmol/L (ref 98–111)
Creatinine, Ser: 2.27 mg/dL — ABNORMAL HIGH (ref 0.44–1.00)
GFR calc Af Amer: 23 mL/min — ABNORMAL LOW (ref >60)
GFR calc non Af Amer: 19 mL/min — ABNORMAL LOW (ref >60)
Glucose, Bld: 157 mg/dL — ABNORMAL HIGH (ref 70–99)
Potassium: 3.6 mmol/L (ref 3.5–5.1)
SODIUM: 141 mmol/L (ref 135–145)
Total Bilirubin: 2.4 mg/dL — ABNORMAL HIGH (ref 0.3–1.2)
Total Protein: 7 g/dL (ref 6.5–8.1)

## 2018-05-26 LAB — URINALYSIS, ROUTINE W REFLEX MICROSCOPIC
BILIRUBIN URINE: NEGATIVE
Glucose, UA: NEGATIVE mg/dL
Ketones, ur: 20 mg/dL — AB
Nitrite: NEGATIVE
Protein, ur: 100 mg/dL — AB
SPECIFIC GRAVITY, URINE: 1.017 (ref 1.005–1.030)
pH: 5 (ref 5.0–8.0)

## 2018-05-26 LAB — DIFFERENTIAL
Abs Immature Granulocytes: 0.03 10*3/uL (ref 0.00–0.07)
Basophils Absolute: 0 10*3/uL (ref 0.0–0.1)
Basophils Relative: 0 %
Eosinophils Absolute: 0 10*3/uL (ref 0.0–0.5)
Eosinophils Relative: 0 %
Immature Granulocytes: 0 %
Lymphocytes Relative: 9 %
Lymphs Abs: 0.7 10*3/uL (ref 0.7–4.0)
Monocytes Absolute: 0.4 10*3/uL (ref 0.1–1.0)
Monocytes Relative: 6 %
Neutro Abs: 6.2 10*3/uL (ref 1.7–7.7)
Neutrophils Relative %: 85 %

## 2018-05-26 LAB — CBC
HCT: 48.3 % — ABNORMAL HIGH (ref 36.0–46.0)
Hemoglobin: 14.8 g/dL (ref 12.0–15.0)
MCH: 26.4 pg (ref 26.0–34.0)
MCHC: 30.6 g/dL (ref 30.0–36.0)
MCV: 86.1 fL (ref 80.0–100.0)
Platelets: 159 10*3/uL (ref 150–400)
RBC: 5.61 MIL/uL — ABNORMAL HIGH (ref 3.87–5.11)
RDW: 15.1 % (ref 11.5–15.5)
WBC: 7.3 10*3/uL (ref 4.0–10.5)
nRBC: 0 % (ref 0.0–0.2)

## 2018-05-26 LAB — RAPID URINE DRUG SCREEN, HOSP PERFORMED
Amphetamines: NOT DETECTED
Barbiturates: NOT DETECTED
Benzodiazepines: NOT DETECTED
Cocaine: NOT DETECTED
Opiates: NOT DETECTED
Tetrahydrocannabinol: NOT DETECTED

## 2018-05-26 LAB — INFLUENZA PANEL BY PCR (TYPE A & B)
Influenza A By PCR: NEGATIVE
Influenza B By PCR: NEGATIVE

## 2018-05-26 LAB — PROTIME-INR
INR: 1.27
Prothrombin Time: 15.8 seconds — ABNORMAL HIGH (ref 11.4–15.2)

## 2018-05-26 LAB — CBG MONITORING, ED: Glucose-Capillary: 132 mg/dL — ABNORMAL HIGH (ref 70–99)

## 2018-05-26 LAB — APTT: aPTT: 31 seconds (ref 24–36)

## 2018-05-26 LAB — CK: Total CK: 23612 U/L — ABNORMAL HIGH (ref 38–234)

## 2018-05-26 LAB — I-STAT TROPONIN, ED: Troponin i, poc: 1.49 ng/mL (ref 0.00–0.08)

## 2018-05-26 MED ORDER — SODIUM CHLORIDE 0.9 % IV SOLN
INTRAVENOUS | Status: AC
  Administered 2018-05-26: 22:00:00 via INTRAVENOUS
  Administered 2018-05-27: 1000 mL via INTRAVENOUS
  Administered 2018-05-27: 01:00:00 via INTRAVENOUS

## 2018-05-26 MED ORDER — VANCOMYCIN VARIABLE DOSE PER UNSTABLE RENAL FUNCTION (PHARMACIST DOSING): Status: DC

## 2018-05-26 MED ORDER — ASPIRIN EC 81 MG PO TBEC: 81.0000 mg | DELAYED_RELEASE_TABLET | Freq: Every day | ORAL | Status: DC

## 2018-05-26 MED ORDER — HEPARIN SODIUM (PORCINE) 5000 UNIT/ML IJ SOLN
5000.0000 [IU] | Freq: Three times a day (TID) | INTRAMUSCULAR | Status: DC
  Administered 2018-05-27 – 2018-05-31 (×13): 5000 [IU] via SUBCUTANEOUS
  Filled 2018-05-26 (×13): qty 1

## 2018-05-26 MED ORDER — VANCOMYCIN HCL 10 G IV SOLR
2000.0000 mg | Freq: Once | INTRAVENOUS | Status: DC
  Filled 2018-05-26: qty 2000

## 2018-05-26 MED ORDER — ASPIRIN 81 MG PO CHEW
324.0000 mg | CHEWABLE_TABLET | Freq: Once | ORAL | Status: AC
  Administered 2018-05-26: 324 mg via ORAL
  Filled 2018-05-26: qty 4

## 2018-05-26 MED ORDER — SODIUM CHLORIDE 0.9 % IV BOLUS
1000.0000 mL | Freq: Once | INTRAVENOUS | Status: AC
  Administered 2018-05-26: 1000 mL via INTRAVENOUS

## 2018-05-26 MED ORDER — SODIUM CHLORIDE 0.9% FLUSH
3.0000 mL | Freq: Two times a day (BID) | INTRAVENOUS | Status: DC
  Administered 2018-05-26: 3 mL via INTRAVENOUS

## 2018-05-26 MED ORDER — PIPERACILLIN-TAZOBACTAM 3.375 G IVPB 30 MIN
3.3750 g | Freq: Once | INTRAVENOUS | Status: AC
  Administered 2018-05-26: 3.375 g via INTRAVENOUS
  Filled 2018-05-26: qty 50

## 2018-05-26 MED ORDER — SODIUM CHLORIDE 0.9 % IV BOLUS
500.0000 mL | Freq: Once | INTRAVENOUS | Status: AC
  Administered 2018-05-26: 500 mL via INTRAVENOUS

## 2018-05-26 MED ORDER — CLOPIDOGREL BISULFATE 75 MG PO TABS: 75.0000 mg | ORAL_TABLET | Freq: Every day | ORAL | Status: DC

## 2018-05-26 NOTE — Progress Notes (Signed)
Pharmacy Antibiotic Note  [name] is a 85 y.o. female admitted on 05/26/2018 with sepsis.  Pharmacy has been consulted for Vancomycin dosing.  Plan: Vancomycin 2 grams IV x 1, future dosing based on pharmacy due to AKI Will monitor renal function, C&S, and vanc levels as appropriate  Height: 5\' 3"  (160 cm) Weight: 206 lb (93.4 kg) IBW/kg (Calculated) : 52.4  No data recorded.  Recent Labs  Lab 05/26/18 1740  WBC 7.3  CREATININE 2.27*    Estimated Creatinine Clearance: 20.8 mL/min (A) (by C-G formula based on SCr of 2.27 mg/dL (H)).    No Known Allergies  Antimicrobials this admission: Vanc 1/27 >>  Zosyn 1/27 >>    Thank you for allowing pharmacy to be a part of this patient's care.  Alanda Slim, PharmD, [facility] Clinical Pharmacist Please see AMION for all Pharmacists' Contact Phone Numbers 05/26/2018, 8:19 PM

## 2018-05-26 NOTE — H&P (Signed)
History and Physical    [name] UPJ:[id number] DOB: [birth year] DOA: 05/26/2018  PCP: Randel Books, FNP  Patient coming from: Home  I have personally briefly reviewed patient's old medical records in [place]  Chief Complaint: Fall at home  HPI: [name] is a 85 y.o. female with medical history significant for CHB s/p Medtronic Adapta DR Pacemaker, Hx of CVA with resulting gait disorder, HTN, and HLD who presents to the ED after a fall at home with prolonged downtime.    Patient states on morning of 05/25/2018 she was ambulating to the bathroom with the use of a walker when the walker slipped out underneath her and she fell onto the floor and was unable to get up.  She states that she was laying mostly on her right side.  She was down until about 1430-1500 on 05/26/2018 family found her on the floor.    They called 911 and per documentation, patient initially refused transport to the hospital and stay at home with family.  They tried to ambulate her to the bathroom however she was unable to walk as usual therefore EMS were called again and she was brought to the ED.  Family also reports patient had transient episode of slurred speech.    Patient denies any chest pain, palpitations, abdominal pain, or dysuria.  She says she urinated on herself while she was down as she was unable to get up to get to the bathroom.  She denies any seizure-like activity.  She does not report any new focal weakness.  ED Course:  Per EDP documentation, patient had slurred speech, 3/5 strength LUE compared to right and left-sided facial deficits on arrival.  Initial vitals showed BP 187/58, pulse 90, RR 31, reported temp 101 Fahrenheit, SPO2 96% on room air.  Labs are notable for BUN 50, creatinine 2.27 (renal function normal at baseline), AST 446, ALT is 170, alk phos 52, T bili 2.4. WBC 7.3, hemoglobin 14.8, platelets 159. I-STAT troponin I 0.49, CK 23,312. Urinalysis collected  and pending. Blood cultures and influenza panel were obtained and pending.  Patient was given aspirin 324 mg once, normal saline 2.5 L, and IV vancomycin and Zosyn.  Blood pressure improved with IV fluids.  CT head without contrast was negative for acute intracranial abnormality, showed stable chronic small infarcts, microvascular ischemic changes, and mild atrophy.  Portable chest x-ray was limited due to rotation without any obvious active cardiopulmonary process.  Left chest wall pacemaker in place.  X-ray right shoulder was negative for acute fracture, dislocation, or osseous abnormality.  Mild glenohumeral osteoarthritis slightly progressed since 2011 is noted.  Per EDP documentation pacemaker was interrogated apparently without any events, no V. tach or V. fib, intermittent atrial tachycardia rate of 105 noted.  Neurology were consulted and will see patient in consultation.  The hospital service was consulted to admit for further evaluation and management.   Review of Systems: As per HPI otherwise 10 point review of systems negative.    Past Medical History:  Diagnosis Date  . Complete heart block ([place])   . Gait abnormality 07/23/2016  . History of hypertension   . History of stroke   . Hyperhomocysteinemia ([place])   . Hyperlipidemia   . Hypertension   . Hypokalemia   . Pacemaker-Medtronic 08/07/2011    Past Surgical History:  Procedure Laterality Date  . CARDIAC CATHETERIZATION    . PACEMAKER INSERTION     Medtronic Enpulse dual-chamber pacemaker  . PERMANENT  PACEMAKER GENERATOR CHANGE N/A 07/30/2013   Procedure: PERMANENT PACEMAKER GENERATOR CHANGE;  Surgeon: [name], MD;  Location: [facility] CATH LAB;  Service: Cardiovascular;  Laterality: N/A;     reports that she quit smoking about 11 years ago. She has never used smokeless tobacco. She reports that she does not drink alcohol or use drugs.  No Known Allergies  Family History  Problem Relation Age of Onset  .  Hypertension Mother      Prior to Admission medications   Medication Sig Start Date End Date Taking? Authorizing Provider  acetaminophen (TYLENOL) 500 MG tablet Take 1,000 mg by mouth every 6 (six) hours as needed.    [provider]  aspirin 81 MG tablet Take 81 mg by mouth daily.    [provider]  clopidogrel (PLAVIX) 75 MG tablet Take 75 mg by mouth daily with breakfast.    [provider]  hydrochlorothiazide (HYDRODIURIL) 25 MG tablet Take 25 mg by mouth daily. 08/16/17   [provider]  labetalol (NORMODYNE) 200 MG tablet Take 1 tablet (200 mg total) by mouth 2 (two) times daily. 12/04/17   [name], MD  simvastatin (ZOCOR) 40 MG tablet Take 1 tablet (40 mg total) by mouth at bedtime. 10/25/17   [name], MD    Physical Exam: Vitals:   05/26/18 1945 05/26/18 2000 05/26/18 2015 05/26/18 2030  BP: 109/67 (!) 97/59 99/63 106/77  Pulse: 93 96 90 91  Resp: _0 (!) 21  SpO2: 97% 98% 98% 96%  Weight:  93.4 kg    Height:  _1  (1.6 m)      Constitutional: Obese woman resting in bed, NAD, calm, comfortable Eyes: PERRL, EOMI, lids and conjunctivae normal ENMT: Mucous membranes are moist. Posterior pharynx clear of any exudate or lesions. Dentures in place. Neck: normal, supple, no masses. Respiratory: clear to auscultation bilaterally, no wheezing, no crackles. Normal respiratory effort. No accessory muscle use.  Cardiovascular: Regular rate and rhythm, no murmurs / rubs / gallops. No extremity edema. 2+ pedal pulses.  PPM in place. Abdomen: no tenderness, no masses palpated. No hepatosplenomegaly. Bowel sounds positive.  Musculoskeletal: no clubbing / cyanosis. No joint deformity upper and lower extremities.  ROM slightly decreased left upper and lower extremity. Skin: no rashes, lesions, ulcers. No induration Neurologic: CN 2-12 grossly intact. Sensation intact, Strength 4/5 in LUE and LLE, 5/5 RUE and RLE.  No  dysmetria. Psychiatric: Normal judgment and insight. Alert and oriented x 3. Normal mood.   Labs on Admission: I have personally reviewed following labs and imaging studies  CBC: Recent Labs  Lab 05/26/18 1740  WBC 7.3  NEUTROABS 6.2  HGB 14.8  HCT 48.3*  MCV 86.1  PLT 110   Basic Metabolic Panel: Recent Labs  Lab 05/26/18 1740  NA 141  K 3.6  CL 101  CO2 19*  GLUCOSE 157*  BUN 50*  CREATININE 2.27*  CALCIUM 8.6*   GFR: Estimated Creatinine Clearance: 20.8 mL/min (A) (by C-G formula based on SCr of 2.27 mg/dL (H)). Liver Function Tests: Recent Labs  Lab 05/26/18 1740  AST 446*  ALT 170*  ALKPHOS 52  BILITOT 2.4*  PROT 7.0  ALBUMIN 3.0*   No results for input(s): LIPASE, AMYLASE in the last 168 hours. No results for input(s): AMMONIA in the last 168 hours. Coagulation Profile: Recent Labs  Lab 05/26/18 1740  INR 1.27   Cardiac Enzymes: Recent Labs  Lab 05/26/18 1740  CKTOTAL 21,117*  BNP (last 3 results) No results for input(s): PROBNP in the last 8760 hours. HbA1C: No results for input(s): HGBA1C in the last 72 hours. CBG: Recent Labs  Lab 05/26/18 1910  GLUCAP 132*   Lipid Profile: No results for input(s): CHOL, HDL, LDLCALC, TRIG, CHOLHDL, LDLDIRECT in the last 72 hours. Thyroid Function Tests: No results for input(s): TSH, T4TOTAL, FREET4, T3FREE, THYROIDAB in the last 72 hours. Anemia Panel: No results for input(s): VITAMINB12, FOLATE, FERRITIN, TIBC, IRON, RETICCTPCT in the last 72 hours. Urine analysis:    Component Value Date/Time   COLORURINE AMBER (A) 05/26/2018 2130   APPEARANCEUR HAZY (A) 05/26/2018 2130   LABSPEC 1.017 05/26/2018 2130   PHURINE 5.0 05/26/2018 2130   GLUCOSEU NEGATIVE 05/26/2018 2130   HGBUR LARGE (A) 05/26/2018 2130   BILIRUBINUR NEGATIVE 05/26/2018 2130   KETONESUR 20 (A) 05/26/2018 2130   PROTEINUR 100 (A) 05/26/2018 2130   NITRITE NEGATIVE 05/26/2018 2130   LEUKOCYTESUR TRACE (A) 05/26/2018 2130     Radiological Exams on Admission: Ct Head Wo Contrast  Result Date: 05/26/2018 CLINICAL DATA:  Unwitnessed fall yesterday. EXAM: CT HEAD WITHOUT CONTRAST TECHNIQUE: Contiguous axial images were obtained from the base of the skull through the vertex without intravenous contrast. COMPARISON:  None. FINDINGS: Brain: No evidence of acute infarction, hemorrhage, hydrocephalus, extra-axial collection or mass lesion/mass effect. Unchanged chronic infarcts in the left cerebellum, right basal ganglia, and left thalamus. Stable mild atrophy and chronic microvascular ischemic changes. Vascular: Atherosclerotic vascular calcification of the carotid siphons. No hyperdense vessel. Skull: Negative for fracture or focal lesion. Sinuses/Orbits: Mild paranasal sinus mucosal thickening. No air-fluid levels. The mastoid air cells are clear. The orbits are unremarkable. Other: None. IMPRESSION: 1.  No acute intracranial abnormality. 2. Stable chronic small infarcts, microvascular ischemic changes, and mild atrophy. Electronically Signed   By: Titus Dubin M.D.   On: 05/26/2018 17:42   Dg Chest Portable 1 View  Result Date: 05/26/2018 CLINICAL DATA:  Fever. EXAM: PORTABLE CHEST 1 VIEW COMPARISON:  Chest x-ray dated June 11, 2016. FINDINGS: The patient is rotated to the right, limiting evaluation. Unchanged left chest wall pacemaker. Grossly unchanged cardiomediastinal silhouette. Normal pulmonary vascularity. No focal consolidation, pleural effusion, or pneumothorax. No acute osseous abnormality. IMPRESSION: Limited study due to rotation.  No active disease. Electronically Signed   By: Titus Dubin M.D.   On: 05/26/2018 17:35   Dg Shoulder Right Portable  Result Date: 05/26/2018 CLINICAL DATA:  Fall. EXAM: PORTABLE RIGHT SHOULDER COMPARISON:  Right shoulder x-rays dated April 04, 2010. FINDINGS: No acute fracture or dislocation. Mild glenohumeral joint space narrowing with small marginal osteophytes, slightly  progressed since 2011. The acromioclavicular joint space is preserved. Small marginal acromion osteophytes. Soft tissues are unremarkable. IMPRESSION: 1.  No acute osseous abnormality. 2. Mild glenohumeral osteoarthritis, slightly progressed since 2011. Electronically Signed   By: Titus Dubin M.D.   On: 05/26/2018 17:37    EKG: Independently reviewed.  Sinus rhythm, prolonged PR interval, motion artifact, does not appear to be paced.  Assessment/Plan Principal Problem:   Rhabdomyolysis Active Problems:   AV BLOCK, COMPLETE   History of CVA (cerebrovascular accident)   Essential hypertension   Hyperlipidemia   AKI (acute kidney injury) ([place])   Transaminitis   Elevated troponin  [name] is a 85 y.o. female with medical history significant for CHB s/p Medtronic Adapta DR Pacemaker, Hx of CVA with resulting gait disorder, HTN, and HLD who presents to the ED after a reported mechanical fall  at home with downtime >24hrs to have rhabdomyolysis, acute kidney injury, transaminitis, and new left-sided weakness.   Rhabdomyolysis: Secondary to prolonged downtime, CK up to 23,612.  Urinalysis with 6-10 RBCs on microscopy. S/p 2.5 liters NS in ED. -Maintenance IV fluids overnight -Strict I/O's, monitor UOP -Repeat CK in a.m.  Acute kidney injury: Secondary to rhabdomyolysis and hypotension.  Renal function at baseline is normal. -Continue IV fluids as above -Hold home HCTZ -Monitor renal function  Left-sided weakness, history of CVA: She has apparent new left-sided weakness concerning for potential CVA possibly secondary to hypoperfusion during hypotension.  CT head negative.  She has a MRI incompatible pacemaker in place.  Cannot receive IV contrast with AKI.  Per outpatient neurology note from 06/03/2017, she does have some baseline gait abnormality due to prior cerebrovascular disease. -Neurology consulted, appreciate further recommendations -Neurochecks, PT/OT/SLP  eval -Continue aspirin/Plavix  Fall at home: Per patient report, seems to be mechanical in nature after losing control of her walker.  Pacemaker interrogated as documented below.  Monitor on telemetry.  PT/OT eval as above.  Hypotension, history of hypertension: Secondary to acute illness and prolonged downtime.  No obvious infection, will discontinue antibiotics.  Hold home labetalol and HCTZ.  She is responding well to IV fluid resuscitation.  Elevated troponin: I-STAT troponin I 1.49 on admission.  Suspect type II NSTEMI.  Patient denies any chest pain.  Will trend troponin.  Transaminitis: Suspect shock liver.  Continue to monitor.  Hold home statin for now.  CHB s/p Medtronic Adapta DR Pacemaker: Per EDP documentation pacemaker was interrogated apparently without any events, no V. tach or V. fib, intermittent atrial tachycardia rate of 105 noted.  This is a MRI incompatible pacemaker.  Hyperlipidemia: Hold statin as above.  DVT prophylaxis: subq heparin  Code Status: Full code, confirmed with patient Family Communication: Discussed with daughter and multiple grandchildren at bedside Disposition Plan: Pending clinical progress of renal recovery, suspected CVA work-up, and PT/OT eval's Consults called: Neurology Admission status: Inpatient   Zada Finders MD [facility] Pager [phone]  If 7PM-7AM, please contact night-coverage [email or web address]  05/26/2018, 10:16 PM

## 2018-05-26 NOTE — ED Notes (Signed)
Attempted to give report to [id number] RN, rn received another patient and will call back directly to take report.

## 2018-05-26 NOTE — ED Triage Notes (Signed)
Pt BIB [employer] [facility] from home. Patient reportedly had unwitnessed fall yesterday morning while walking with her walker prior to church and was on the floor for over 24 hours. Family checked on the patient today and called EMS. Pt states she was walking and slid to the floor yesterday. Family reports AMS and increased weakness with walking. VSS for EMS.A/Ox4 at baseline.

## 2018-05-26 NOTE — ED Provider Notes (Signed)
[facility] EMERGENCY DEPARTMENT Provider Note   CSN: [id number] Arrival date & time: 05/26/18  [place]     History   Chief Complaint Chief Complaint  Patient presents with  . Fall    HPI [name] is a 85 y.o. female brought in by EMS after fall that occurred on Sunday morning.  Patient lives at home alone ambulates with walker.  She reports that she lost her balance and fell on Sunday morning she then was unable to remove herself from the floor for greater than 24 hours.  Family found patient on the floor today and called 911.  Per EMS patient initially refused transport to hospital and patient remained at home with family.  Family took patient to bathroom and patient was unable to ambulate as she normally does so EMS was again called to the residence and patient taken to emergency department.  On arrival patient with no obvious injuries however on physical examination does report some right shoulder pain with movement.  Additionally patient with left-sided facial droop and slurred speech, family at bedside state that this is new today.  Of note patient with history of prior stroke currently on Plavix.  Last seen normal 05/24/2018.  HPI  Past Medical History:  Diagnosis Date  . Complete heart block ([place])   . Gait abnormality 07/23/2016  . History of hypertension   . History of stroke   . Hyperhomocysteinemia ([place])   . Hyperlipidemia   . Hypertension   . Hypokalemia   . Pacemaker-Medtronic 08/07/2011    Patient Active Problem List   Diagnosis Date Noted  . Rhabdomyolysis 05/26/2018  . History of CVA (cerebrovascular accident) 05/26/2018  . Essential hypertension 05/26/2018  . Hyperlipidemia 05/26/2018  . AKI (acute kidney injury) ([place]) 05/26/2018  . Transaminitis 05/26/2018  . Elevated troponin 05/26/2018  . Gait abnormality 07/23/2016  . Pacemaker-Medtronic 08/07/2011  . Atrial fibrillation ([place]) 08/07/2011  . Daytime somnolence 08/07/2011    . HYPERTENSION, HEART CONTROLLED W/O ASSOC CHF 06/07/2010  . AV BLOCK, COMPLETE 06/07/2010  . CVA 06/07/2010    Past Surgical History:  Procedure Laterality Date  . CARDIAC CATHETERIZATION    . PACEMAKER INSERTION     Medtronic Enpulse dual-chamber pacemaker  . PERMANENT PACEMAKER GENERATOR CHANGE N/A 07/30/2013   Procedure: PERMANENT PACEMAKER GENERATOR CHANGE;  Surgeon: [name], MD;  Location[facility] C[facility] LAB;  Service: Cardiovascular;  Laterality: N/A;     OB History   No obstetric history on file.      Home Medications    Prior to Admission medications   Medication Sig Start Date End Date Taking? Authorizing Provider  acetaminophen (TYLENOL) 500 MG tablet Take 1,000 mg by mouth every 6 (six) hours as needed.    [provider]  aspirin 81 MG tablet Take 81 mg by mouth daily.    [provider]  clopidogrel (PLAVIX) 75 MG tablet Take 75 mg by mouth daily with breakfast.    [provider]  hydrochlorothiazide (HYDRODIURIL) 25 MG tablet Take 25 mg by mouth daily. 08/16/17   [provider]  labetalol (NORMODYNE) 200 MG tablet Take 1 tablet (200 mg total) by mouth 2 (two) times daily. 12/04/17   [name], MD  simvastatin (ZOCOR) 40 MG tablet Take 1 tablet (40 mg total) by mouth at bedtime. 10/25/17   [name], MD    Family History Family History  Problem Relation Age of Onset  . Hypertension Mother  Social History Social History   Tobacco Use  . Smoking status: Former Smoker    Last attempt to quit: 07/25/2006    Years since quitting: 11.8  . Smokeless tobacco: Never Used  Substance Use Topics  . Alcohol use: No  . Drug use: No     Allergies   Patient has no known allergies.   Review of Systems Review of Systems  Constitutional: Negative.  Negative for chills and fever.  Eyes: Negative.  Negative for visual disturbance.  Respiratory: Negative.  Negative for cough and shortness of breath.   Cardiovascular:  Negative.  Negative for chest pain.  Gastrointestinal: Negative.  Negative for abdominal pain, diarrhea, nausea and vomiting.  Musculoskeletal: Positive for arthralgias (Right shoulder pain). Negative for back pain and neck pain.  Neurological: Positive for speech difficulty (Slurred) and weakness (Left-sided). Negative for dizziness, syncope and headaches.  All other systems reviewed and are negative.  Physical Exam Updated Vital Signs BP 106/77   Pulse 91   Resp (!) 21   Ht 5\' 3"  (1.6 m)   Wt 93.4 kg   SpO2 96%   BMI 36.49 kg/m   Physical Exam Constitutional:      General: She is not in acute distress.    Appearance: She is well-developed. She is obese.  HENT:     Head: Normocephalic and atraumatic.     Right Ear: Tympanic membrane, ear canal and external ear normal.     Left Ear: Tympanic membrane, ear canal and external ear normal.     Nose: Nose normal.     Mouth/Throat:     Mouth: Mucous membranes are moist.     Pharynx: Oropharynx is clear.  Eyes:     Extraocular Movements: Extraocular movements intact.     Conjunctiva/sclera: Conjunctivae normal.     Pupils: Pupils are equal, round, and reactive to light.  Neck:     Musculoskeletal: Normal range of motion and neck supple.     Trachea: Trachea normal. No tracheal deviation.  Cardiovascular:     Rate and Rhythm: Normal rate and regular rhythm.     Pulses: Normal pulses.          Dorsalis pedis pulses are 2+ on the right side and 2+ on the left side.       Posterior tibial pulses are 2+ on the right side and 2+ on the left side.     Heart sounds: Normal heart sounds.  Pulmonary:     Effort: Pulmonary effort is normal. No respiratory distress.     Breath sounds: Normal breath sounds and air entry.  Chest:     Chest wall: No tenderness.     Comments: No sign of injury to the chest. Abdominal:     Palpations: Abdomen is soft.     Tenderness: There is no abdominal tenderness. There is no guarding or rebound.      Comments: No sign of injury to the abdomen  Musculoskeletal: Normal range of motion.     Right lower leg: Normal.     Left lower leg: Normal.     Comments: No midline C/T/L spinal tenderness to palpation, no paraspinal muscle tenderness, no deformity, crepitus, or step-off noted. No sign of injury to the neck or back.  Hips stable to palpation bilaterally without pain.  Patient able to bring knees towards chest bilaterally without pain.  Cervical Spine: Appearance normal. No obvious bony deformity. No skin swelling, erythema, heat, fluctuance or break of the skin. No TTP  over the cervical spinous processes. No paraspinal tenderness. No step-offs. Patient is able to actively rotate their neck 45 degrees left and right voluntarily without pain and flex and extend the neck without pain.  Right Shoulder: Small abrasion to superior aspect. No obvious bony deformity. No skin swelling, erythema, heat, fluctuance. No clavicular deformity. TTP over right deltoid and abrasion. Active and passive flexion, extension, abduction, adduction, and internal/external rotation intact with some pain without crepitus. Strength for flexion, extension, abduction, adduction, and internal/external rotation intact and appropriate for age and condition.  Right Elbow: Appearance normal. No obvious bony deformity. No skin swelling, erythema, heat, fluctuance or break of the skin. No TTP over joint. Active flexion, extension, supination and pronation full and intact without pain. Strength able and appropriate for age for flexion and extension.  Radial Pulse 2+. Cap refill <2 seconds. SILT for M/U/R distributions. Compartments soft.   Feet:     Right foot:     Protective Sensation: 3 sites tested. 3 sites sensed.     Left foot:     Protective Sensation: 3 sites tested. 3 sites sensed.  Skin:    General: Skin is warm and dry.          Comments: Small abrasion overlying superior aspect of right deltoid.  Patient states that  this is the area where she has indicated that her shoulder was tender.  No obvious deformity present.  Neurological:     Mental Status: She is alert and oriented to person, place, and time.     GCS: GCS eye subscore is 4. GCS verbal subscore is 5. GCS motor subscore is 6.     Comments: Mental Status: Alert, oriented, thought content appropriate, able to give a coherent history.   Patient with slightly slurred speech, abnormal per family at bedside  Able to follow 2 step commands without difficulty. Cranial Nerves: II: Peripheral visual fields grossly normal, pupils equal, round, reactive to light III,IV, VI: ptosis not present, extra-ocular motions intact bilaterally V,VII: Left-sided facial droop, eyebrows raise symmetric, facial light touch sensation equal VIII: hearing grossly normal to voice X: uvula elevates symmetrically XI: bilateral shoulder shrug symmetric and strong XII: midline tongue extension without fassiculations Motor: 3/5 left grip/push/pull compared to right upper extremity, questionable decreased strength with left plantar dorsiflexion. Sensory: Sensation intact to light touch in all extremities. CV: distal pulses palpable throughout  Psychiatric:        Mood and Affect: Mood normal.        Behavior: Behavior normal.      ED Treatments / Results  Labs (all labs ordered are listed, but only abnormal results are displayed) Labs Reviewed  PROTIME-INR - Abnormal; Notable for the following components:      Result Value   Prothrombin Time 15.8 (*)    All other components within normal limits  CBC - Abnormal; Notable for the following components:   RBC 5.61 (*)    HCT 48.3 (*)    All other components within normal limits  COMPREHENSIVE METABOLIC PANEL - Abnormal; Notable for the following components:   CO2 19 (*)    Glucose, Bld 157 (*)    BUN 50 (*)    Creatinine, Ser 2.27 (*)    Calcium 8.6 (*)    Albumin 3.0 (*)    AST 446 (*)    ALT 170 (*)     Total Bilirubin 2.4 (*)    GFR calc non Af Amer 19 (*)    GFR calc Af  Amer 23 (*)    Anion gap 21 (*)    All other components within normal limits  CK - Abnormal; Notable for the following components:   Total CK 23,612 (*)    All other components within normal limits  I-STAT TROPONIN, ED - Abnormal; Notable for the following components:   Troponin i, poc 1.49 (*)    All other components within normal limits  CBG MONITORING, ED - Abnormal; Notable for the following components:   Glucose-Capillary 132 (*)    All other components within normal limits  CULTURE, BLOOD (ROUTINE X 2)  CULTURE, BLOOD (ROUTINE X 2)  APTT  DIFFERENTIAL  ETHANOL  RAPID URINE DRUG SCREEN, HOSP PERFORMED  URINALYSIS, ROUTINE W REFLEX MICROSCOPIC  INFLUENZA PANEL BY PCR (TYPE A & B)    EKG EKG Interpretation  Date/Time:  Monday May 26 2018 18:21:44 EST Ventricular Rate:  69 PR Interval:    QRS Duration: 133 QT Interval:  523 QTC Calculation: 553 R Axis:   -15 Text Interpretation:  Sinus or ectopic atrial rhythm Atrial premature complex Prolonged PR interval Nonspecific intraventricular conduction delay Anterior infarct, old Poor data quality Confirmed by Virgel Manifold [phone]) on 05/26/2018 7:28:29 PM   Radiology Ct Head Wo Contrast  Result Date: 05/26/2018 CLINICAL DATA:  Unwitnessed fall yesterday. EXAM: CT HEAD WITHOUT CONTRAST TECHNIQUE: Contiguous axial images were obtained from the base of the skull through the vertex without intravenous contrast. COMPARISON:  None. FINDINGS: Brain: No evidence of acute infarction, hemorrhage, hydrocephalus, extra-axial collection or mass lesion/mass effect. Unchanged chronic infarcts in the left cerebellum, right basal ganglia, and left thalamus. Stable mild atrophy and chronic microvascular ischemic changes. Vascular: Atherosclerotic vascular calcification of the carotid siphons. No hyperdense vessel. Skull: Negative for fracture or focal lesion. Sinuses/Orbits:  Mild paranasal sinus mucosal thickening. No air-fluid levels. The mastoid air cells are clear. The orbits are unremarkable. Other: None. IMPRESSION: 1.  No acute intracranial abnormality. 2. Stable chronic small infarcts, microvascular ischemic changes, and mild atrophy. Electronically Signed   By: Titus Dubin M.D.   On: 05/26/2018 17:42   Dg Chest Portable 1 View  Result Date: 05/26/2018 CLINICAL DATA:  Fever. EXAM: PORTABLE CHEST 1 VIEW COMPARISON:  Chest x-ray dated June 11, 2016. FINDINGS: The patient is rotated to the right, limiting evaluation. Unchanged left chest wall pacemaker. Grossly unchanged cardiomediastinal silhouette. Normal pulmonary vascularity. No focal consolidation, pleural effusion, or pneumothorax. No acute osseous abnormality. IMPRESSION: Limited study due to rotation.  No active disease. Electronically Signed   By: Titus Dubin M.D.   On: 05/26/2018 17:35   Dg Shoulder Right Portable  Result Date: 05/26/2018 CLINICAL DATA:  Fall. EXAM: PORTABLE RIGHT SHOULDER COMPARISON:  Right shoulder x-rays dated April 04, 2010. FINDINGS: No acute fracture or dislocation. Mild glenohumeral joint space narrowing with small marginal osteophytes, slightly progressed since 2011. The acromioclavicular joint space is preserved. Small marginal acromion osteophytes. Soft tissues are unremarkable. IMPRESSION: 1.  No acute osseous abnormality. 2. Mild glenohumeral osteoarthritis, slightly progressed since 2011. Electronically Signed   By: Titus Dubin M.D.   On: 05/26/2018 17:37    Procedures .Critical Care Performed by: Deliah Boston, PA-C Authorized by: Deliah Boston, PA-C   Critical care provider statement:    Critical care time (minutes):  45   Critical care was necessary to treat or prevent imminent or life-threatening deterioration of the following conditions:  Circulatory failure, CNS failure or compromise, shock and metabolic crisis   Critical care was time  spent  personally by me on the following activities:  Discussions with consultants, evaluation of patient's response to treatment, examination of patient, ordering and performing treatments and interventions, ordering and review of laboratory studies, ordering and review of radiographic studies, pulse oximetry, re-evaluation of patient's condition, obtaining history from patient or surrogate, review of old charts and development of treatment plan with patient or surrogate   (including critical care time)  Medications Ordered in ED Medications  vancomycin (VANCOCIN) 2,000 mg in sodium chloride 0.9 % 500 mL IVPB (has no administration in time range)  vancomycin variable dose per unstable renal function (pharmacist dosing) (has no administration in time range)  aspirin chewable tablet 324 mg (has no administration in time range)  sodium chloride 0.9 % bolus 1,000 mL (1,000 mLs Intravenous New Bag/Given 05/26/18 1858)  sodium chloride 0.9 % bolus 500 mL (0 mLs Intravenous Stopped 05/26/18 1959)  piperacillin-tazobactam (ZOSYN) IVPB 3.375 g (0 g Intravenous Stopped 05/26/18 2013)  sodium chloride 0.9 % bolus 1,000 mL (1,000 mLs Intravenous New Bag/Given 05/26/18 1955)     Initial Impression / Assessment and Plan / ED Course  I have reviewed the triage vital signs and the nursing notes.  Pertinent labs & imaging results that were available during my care of the patient were reviewed by me and considered in my medical decision making (see chart for details).    85 year old female on Plavix arrives after fall that occurred 05/25/2018, patient spent greater than 24 hours on the floor prior to the family finding her.  On ED arrival patient with rectal temperature of 101 F, new left-sided facial deficits, slurred speech, 3/5 left upper extremity strength compared to right.  Slurred speech was noted by family when they found her this morning, last seen normal greater than 24 hours ago.  Additionally with right  shoulder pain. No sign of injury to the patient's neck or back, hips stable to compression bilaterally without pain, passive knee-to-chest bilaterally without pain. Question of rhabdo will add CK to stroke work-up. ---------------------------- CT head negative for acute findings DG right shoulder with osteoarthrosis Portable chest limited however without acute findings ----------------------------- 6:25 PM: Patient with elevated troponin of 1.49, patient reassessed she is denying chest pain or shortness of breath and states that she is feeling well.  Discussed with Dr. Wilson Singer. ------------------------ CBC nonacute aPTT nonacute PT/INR with elevated PT CBG 132 CMP with creatinine of 2.27, LFTs elevated CK greater than 23,000 -------------------- Additional fluid boluses ordered, IV antibiotics ordered for infection of unknown source.  Pacemaker interrogated, report called to RN apparently patient without any events, no V. tach or V. fib, of note patient with intermittent atrial tachycardia rate of 105 noted. ----------------------- Patient reevaluated multiple times during this visit. Patient responding well to IV fluids, patient states that she is feeling well and is without pain. ------------------- Consult called to on-call neurology, they will see patient on floor for further evaluation. ----------------- Consult called to hospitalist service for admission they will be seeing patient in ED.  Patient has been admitted to hospitalist service for further evaluation and treatment.  Patient seen and evaluated by Dr. Wilson Singer during this visit.  Note: Portions of this report may have been transcribed using voice recognition software. Every effort was made to ensure accuracy; however, inadvertent computerized transcription errors may still be present. Final Clinical Impressions(s) / ED Diagnoses   Final diagnoses:  Non-traumatic rhabdomyolysis  Elevated troponin  AKI (acute kidney injury)  ([place])  Cerebrovascular accident (CVA), unspecified mechanism (  [facility])    ED Discharge Orders    None       Gari Crown 05/26/18 2141    Virgel Manifold, MD 06/05/18 [phone]

## 2018-05-26 NOTE — ED Provider Notes (Signed)
Medical screening examination/treatment/procedure(s) were conducted as a shared visit with non-physician practitioner(s) and myself.  I personally evaluated the patient during the encounter.  EKG Interpretation  Date/Time:  Monday May 26 2018 18:21:44 EST Ventricular Rate:  69 PR Interval:    QRS Duration: 133 QT Interval:  523 QTC Calculation: 553 R Axis:   -15 Text Interpretation:  Sinus or ectopic atrial rhythm Atrial premature complex Prolonged PR interval Nonspecific intraventricular conduction delay Anterior infarct, old Poor data quality Confirmed by Virgel Manifold [phone]) on 05/26/2018 7:28:29 PM  84yF with extended down period after fall. Clinically had stroke. New dysarthria, facial droop and L sided weakness. Presented outside of window for acute intervention. Febrile. Unclear source but UA and flu still pending. Hypotensive but responding to IVF at this point. Empiric abx. Rhabdomyolysis with CK >20,000. AKI. Potassium ok. Troponin elevated but in the setting on hypotension. Denies CP. Hx of CHB but not any CAD that I can readily tell. Trend. Interogate device. Abnormal LFTs. Denies abdominal pain. Abdominal exam benign. Suspect "shock liver."   She is joking around with me and family and BP coming up. She is very sick though and needs admission for ongoing management.   CRITICAL CARE Performed by: Virgel Manifold Total critical care time: 35 minutes Critical care time was exclusive of separately billable procedures and treating other patients. Critical care was necessary to treat or prevent imminent or life-threatening deterioration. Critical care was time spent personally by me on the following activities: development of treatment plan with patient and/or surrogate as well as nursing, discussions with consultants, evaluation of patient's response to treatment, examination of patient, obtaining history from patient or surrogate, ordering and performing treatments and  interventions, ordering and review of laboratory studies, ordering and review of radiographic studies, pulse oximetry and re-evaluation of patient's condition.    Virgel Manifold, MD 05/26/18 2029

## 2018-05-27 ENCOUNTER — Inpatient Hospital Stay (HOSPITAL_COMMUNITY): Payer: Medicare Other

## 2018-05-27 DIAGNOSIS — R7989 Other specified abnormal findings of blood chemistry: Secondary | ICD-10-CM

## 2018-05-27 DIAGNOSIS — Z8673 Personal history of transient ischemic attack (TIA), and cerebral infarction without residual deficits: Secondary | ICD-10-CM

## 2018-05-27 DIAGNOSIS — N179 Acute kidney failure, unspecified: Secondary | ICD-10-CM

## 2018-05-27 LAB — BLOOD CULTURE ID PANEL (REFLEXED)
Acinetobacter baumannii: NOT DETECTED
Candida albicans: NOT DETECTED
Candida glabrata: NOT DETECTED
Candida krusei: NOT DETECTED
Candida parapsilosis: NOT DETECTED
Candida tropicalis: NOT DETECTED
Carbapenem resistance: NOT DETECTED
Enterobacter cloacae complex: NOT DETECTED
Enterobacteriaceae species: DETECTED — AB
Enterococcus species: NOT DETECTED
Escherichia coli: NOT DETECTED
HAEMOPHILUS INFLUENZAE: NOT DETECTED
KLEBSIELLA PNEUMONIAE: DETECTED — AB
Klebsiella oxytoca: NOT DETECTED
Listeria monocytogenes: NOT DETECTED
Neisseria meningitidis: NOT DETECTED
PROTEUS SPECIES: NOT DETECTED
Pseudomonas aeruginosa: NOT DETECTED
Serratia marcescens: NOT DETECTED
Staphylococcus aureus (BCID): NOT DETECTED
Staphylococcus species: NOT DETECTED
Streptococcus agalactiae: NOT DETECTED
Streptococcus pneumoniae: NOT DETECTED
Streptococcus pyogenes: NOT DETECTED
Streptococcus species: NOT DETECTED

## 2018-05-27 LAB — COMPREHENSIVE METABOLIC PANEL
ALT: 148 U/L — ABNORMAL HIGH (ref 0–44)
AST: 366 U/L — ABNORMAL HIGH (ref 15–41)
Albumin: 2.5 g/dL — ABNORMAL LOW (ref 3.5–5.0)
Alkaline Phosphatase: 41 U/L (ref 38–126)
Anion gap: 14 (ref 5–15)
BUN: 50 mg/dL — ABNORMAL HIGH (ref 8–23)
CO2: 21 mmol/L — ABNORMAL LOW (ref 22–32)
Calcium: 7.3 mg/dL — ABNORMAL LOW (ref 8.9–10.3)
Chloride: 105 mmol/L (ref 98–111)
Creatinine, Ser: 2.1 mg/dL — ABNORMAL HIGH (ref 0.44–1.00)
GFR calc non Af Amer: 21 mL/min — ABNORMAL LOW (ref >60)
GFR, EST AFRICAN AMERICAN: 25 mL/min — AB (ref >60)
Glucose, Bld: 137 mg/dL — ABNORMAL HIGH (ref 70–99)
Potassium: 3.4 mmol/L — ABNORMAL LOW (ref 3.5–5.1)
Sodium: 140 mmol/L (ref 135–145)
Total Bilirubin: 2 mg/dL — ABNORMAL HIGH (ref 0.3–1.2)
Total Protein: 6.5 g/dL (ref 6.5–8.1)

## 2018-05-27 LAB — CBC
HCT: 40.6 % (ref 36.0–46.0)
Hemoglobin: 12.5 g/dL (ref 12.0–15.0)
MCH: 26.3 pg (ref 26.0–34.0)
MCHC: 30.8 g/dL (ref 30.0–36.0)
MCV: 85.5 fL (ref 80.0–100.0)
NRBC: 0 % (ref 0.0–0.2)
Platelets: 121 10*3/uL — ABNORMAL LOW (ref 150–400)
RBC: 4.75 MIL/uL (ref 3.87–5.11)
RDW: 15.3 % (ref 11.5–15.5)
WBC: 8 10*3/uL (ref 4.0–10.5)

## 2018-05-27 LAB — TROPONIN I
Troponin I: 1.08 ng/mL (ref <0.03)
Troponin I: 0.7 ng/mL (ref <0.03)

## 2018-05-27 LAB — MRSA PCR SCREENING: MRSA by PCR: NEGATIVE

## 2018-05-27 LAB — ETHANOL: Alcohol, Ethyl (B): <10 mg/dL (ref <10)

## 2018-05-27 LAB — CK: Total CK: 18280 U/L — ABNORMAL HIGH (ref 38–234)

## 2018-05-27 MED ORDER — ACETAMINOPHEN 325 MG PO TABS
650.0000 mg | ORAL_TABLET | Freq: Four times a day (QID) | ORAL | Status: DC | PRN
  Administered 2018-05-28 – 2018-05-30 (×2): 650 mg via ORAL
  Filled 2018-05-27 (×3): qty 2

## 2018-05-27 MED ORDER — CLOPIDOGREL BISULFATE 75 MG PO TABS
75.0000 mg | ORAL_TABLET | Freq: Every day | ORAL | Status: DC
  Administered 2018-05-27 – 2018-05-31 (×5): 75 mg via ORAL
  Filled 2018-05-27 (×5): qty 1

## 2018-05-27 MED ORDER — SIMVASTATIN 20 MG PO TABS: 40.0000 mg | ORAL_TABLET | Freq: Every day | ORAL | Status: DC

## 2018-05-27 MED ORDER — ORAL CARE MOUTH RINSE
15.0000 mL | Freq: Two times a day (BID) | OROMUCOSAL | Status: DC
  Administered 2018-05-27 – 2018-05-30 (×8): 15 mL via OROMUCOSAL

## 2018-05-27 MED ORDER — SODIUM CHLORIDE 0.9 % IV SOLN
2.0000 g | INTRAVENOUS | Status: DC
  Administered 2018-05-27 – 2018-05-29 (×3): 2 g via INTRAVENOUS
  Filled 2018-05-27 (×3): qty 20

## 2018-05-27 MED ORDER — CHLORHEXIDINE GLUCONATE 0.12 % MT SOLN
15.0000 mL | Freq: Two times a day (BID) | OROMUCOSAL | Status: DC
  Administered 2018-05-27 – 2018-05-31 (×9): 15 mL via OROMUCOSAL
  Filled 2018-05-27 (×10): qty 15

## 2018-05-27 MED ORDER — ACETAMINOPHEN 325 MG PO TABS
650.0000 mg | ORAL_TABLET | Freq: Once | ORAL | Status: AC | PRN
  Administered 2018-05-27: 650 mg via ORAL
  Filled 2018-05-27: qty 2

## 2018-05-27 MED ORDER — POTASSIUM CHLORIDE 20 MEQ/15ML (10%) PO SOLN
40.0000 meq | Freq: Once | ORAL | Status: AC
  Administered 2018-05-27: 40 meq via ORAL
  Filled 2018-05-27: qty 30

## 2018-05-27 MED ORDER — SODIUM CHLORIDE 0.9 % IV SOLN
INTRAVENOUS | Status: AC
  Administered 2018-05-27: 800 mL via INTRAVENOUS
  Administered 2018-05-27: 1000 mL via INTRAVENOUS

## 2018-05-27 MED ORDER — ACETAMINOPHEN 325 MG PO TABS
650.0000 mg | ORAL_TABLET | Freq: Once | ORAL | Status: DC | PRN
  Administered 2018-05-27: 650 mg via ORAL

## 2018-05-27 MED ORDER — ASPIRIN 81 MG PO CHEW
81.0000 mg | CHEWABLE_TABLET | Freq: Every day | ORAL | Status: DC
  Administered 2018-05-27 – 2018-05-31 (×5): 81 mg via ORAL
  Filled 2018-05-27 (×5): qty 1

## 2018-05-27 NOTE — Consult Note (Addendum)
Requesting Physician: Dr. Mesner/Dr. Posey Pronto    Chief Complaint: Left-sided weakness  History obtained from: Patient and Chart    HPI:                                                                                                                                       [name] is an 85 y.o. female with past medical history of CHF status post pacemaker, history of CVA, hypertension, hyperlipidemia presents to the ED after being found down at home.  Patient states that she slipped in the bathroom and fell onto the floor for nearly a day until she was found the following day by her family.  She was brought to the emergency department and noted to have slurred speech and weakness of her left upper extremity as well as left facial droop on arrival.  Her blood pressure was 187/58 on arrival patient found to be febrile and noted to have rhabdomyolysis.  CT head was performed which is negative for any acute stroke/hemorrhage Shows multiple chronic infarcts.  Neurology was consulted for evaluation of possible stroke.  History obtained was mainly through chart review as patient is a poor historian.  Date last known well: 1.26 20  tPA Given:, Outside TPA window\     Past Medical History:  Diagnosis Date  . Complete heart block ([place])   . Gait abnormality 07/23/2016  . History of hypertension   . History of stroke   . Hyperhomocysteinemia ([place])   . Hyperlipidemia   . Hypertension   . Hypokalemia   . Pacemaker-Medtronic 08/07/2011    Past Surgical History:  Procedure Laterality Date  . CARDIAC CATHETERIZATION    . PACEMAKER INSERTION     Medtronic Enpulse dual-chamber pacemaker  . PERMANENT PACEMAKER GENERATOR CHANGE N/A 07/30/2013   Procedure: PERMANENT PACEMAKER GENERATOR CHANGE;  Surgeon: [name], MD;  Location: [facility] CATH LAB;  Service: Cardiovascular;  Laterality: N/A;    Family History  Problem Relation Age of Onset  . Hypertension Mother    Social History:  reports that  she quit smoking about 11 years ago. She has never used smokeless tobacco. She reports that she does not drink alcohol or use drugs.  Allergies: No Known Allergies  Medications:  I reviewed home medications   ROS:                                                                                                                                     14 systems reviewed and negative except above    Examination:                                                                                                      General: Appears obese, lying in bed comfortably Psych: Affect appropriate to situation Eyes: No scleral injection HENT: No OP obstrucion Head: Normocephalic.  Cardiovascular: Normal rate and regular rhythm.  Respiratory: Effort normal and breath sounds normal to anterior ascultation GI: Soft.  No distension. There is no tenderness.  Skin: WDI    Neurological Examination Mental Status: Alert, oriented to herself and place.  Speech fluent without evidence of aphasia.  No obvious dysarthria noted.  Able to follow 3 step commands without difficulty. Cranial Nerves: II: Visual fields grossly normal,  III,IV, VI: ptosis not present, extra-ocular motions intact bilaterally, pupils equal, round, reactive to light and accommodation V,VII: smile symmetric, facial light touch sensation normal bilaterally VIII: hearing normal bilaterally IX,X: uvula rises symmetrically XI: bilateral shoulder shrug XII: midline tongue extension Motor: Right : Upper extremity   4+/5    Left:     Upper extremity   4+/5  Lower extremity   5/5     Lower extremity   5/5 Tone and bulk:normal tone throughout; no atrophy noted Sensory: Pinprick and light touch intact throughout, bilaterally Deep Tendon Reflexes: 2+ and symmetric throughout Plantars: Right: downgoing   Left:  downgoing Cerebellar: normal finger-to-nose slightly impaired due to bilateral shoulder immobility Gait: not assessed     Lab Results: Basic Metabolic Panel: Recent Labs  Lab 05/26/18 1740 05/27/18 0037  NA 141 140  K 3.6 3.4*  CL 101 105  CO2 19* 21*  GLUCOSE 157* 137*  BUN 50* 50*  CREATININE 2.27* 2.10*  CALCIUM 8.6* 7.3*    CBC: Recent Labs  Lab 05/26/18 1740 05/27/18 0037  WBC 7.3 8.0  NEUTROABS 6.2  --   HGB 14.8 12.5  HCT 48.3* 40.6  MCV 86.1 85.5  PLT 159 121*    Coagulation Studies: Recent Labs    05/26/18 1740  LABPROT 15.8*  INR 1.27    Imaging: Ct Head Wo Contrast  Result Date: 05/26/2018 CLINICAL DATA:  Unwitnessed fall yesterday. EXAM: CT HEAD WITHOUT CONTRAST TECHNIQUE: Contiguous axial images were  obtained from the base of the skull through the vertex without intravenous contrast. COMPARISON:  None. FINDINGS: Brain: No evidence of acute infarction, hemorrhage, hydrocephalus, extra-axial collection or mass lesion/mass effect. Unchanged chronic infarcts in the left cerebellum, right basal ganglia, and left thalamus. Stable mild atrophy and chronic microvascular ischemic changes. Vascular: Atherosclerotic vascular calcification of the carotid siphons. No hyperdense vessel. Skull: Negative for fracture or focal lesion. Sinuses/Orbits: Mild paranasal sinus mucosal thickening. No air-fluid levels. The mastoid air cells are clear. The orbits are unremarkable. Other: None. IMPRESSION: 1.  No acute intracranial abnormality. 2. Stable chronic small infarcts, microvascular ischemic changes, and mild atrophy. Electronically Signed   By: Titus Dubin M.D.   On: 05/26/2018 17:42   Dg Chest Portable 1 View  Result Date: 05/26/2018 CLINICAL DATA:  Fever. EXAM: PORTABLE CHEST 1 VIEW COMPARISON:  Chest x-ray dated June 11, 2016. FINDINGS: The patient is rotated to the right, limiting evaluation. Unchanged left chest wall pacemaker. Grossly unchanged  cardiomediastinal silhouette. Normal pulmonary vascularity. No focal consolidation, pleural effusion, or pneumothorax. No acute osseous abnormality. IMPRESSION: Limited study due to rotation.  No active disease. Electronically Signed   By: Titus Dubin M.D.   On: 05/26/2018 17:35   Dg Shoulder Right Portable  Result Date: 05/26/2018 CLINICAL DATA:  Fall. EXAM: PORTABLE RIGHT SHOULDER COMPARISON:  Right shoulder x-rays dated April 04, 2010. FINDINGS: No acute fracture or dislocation. Mild glenohumeral joint space narrowing with small marginal osteophytes, slightly progressed since 2011. The acromioclavicular joint space is preserved. Small marginal acromion osteophytes. Soft tissues are unremarkable. IMPRESSION: 1.  No acute osseous abnormality. 2. Mild glenohumeral osteoarthritis, slightly progressed since 2011. Electronically Signed   By: Titus Dubin M.D.   On: 05/26/2018 17:37     ASSESSMENT AND PLAN   [name] is an 85 y.o. female with past medical history of CHF status post pacemaker, history of CVA, hypertension, hyperlipidemia presents to the ED after being found down at home.  Found to have left facial droop and left-sided weakness in the EDP.  On my assessment, no longer has a facial droop or left upper extremity weakness.  She does have some reduced mobility in both her shoulders which may have caused her to appear weaker.  It is possible she may have had a mild stroke and symptoms have improved versus TIA.    It Is also possible that in the setting of sepsis, her old stroke symptoms may have appeared worse. She does have a chronic right basal ganglia and right periventricular infarction on her CT scan.    Possible mild ischemic stroke/TIA/recrudescence of old stroke symptoms  -Repeat CT head in 24 hours(unable to perform MRI brain due to pacemaker) -Carotid ultrasound (will perform CTA due to AKI) -Echocardiogram -Continue treatment of underlying sepsis, anabolic  abnormalities and rhabdomyolysis - F/U blood cultures, Echo -negative for infection/endocarditis can resume aspirin and Plavix -Frequent neurochecks -PT/ OT eval - stroke swallow screen     [facility] Pager Number [phone]

## 2018-05-27 NOTE — Progress Notes (Addendum)
STROKE TEAM PROGRESS NOTE   INTERVAL HISTORY Her daughter is at the bedside.  She has been up in the chair. Just back to bed by RN and transported in order to take her to vascular lab. Daughter upset to be the one to find her mother down. She is concerned, rightly so, that pt may not be able to return home alone "My mom has always been such a strong woman. I don't want her to give up. I am not losing my mom.". support given.   Vitals:   05/27/18 0406 05/27/18 0526 05/27/18 0538 05/27/18 0800  BP: (!) 99/56   94/80  Pulse: 60   (!) 55  Resp: (!) 25   18  Temp:  (!) 101.3 F (38.5 C)  98.5 F (36.9 C)  TempSrc:  Oral  Oral  SpO2: 98%   94%  Weight:   95 kg   Height:        CBC:  Recent Labs  Lab 05/26/18 1740 05/27/18 0037  WBC 7.3 8.0  NEUTROABS 6.2  --   HGB 14.8 12.5  HCT 48.3* 40.6  MCV 86.1 85.5  PLT 159 121*    Basic Metabolic Panel:  Recent Labs  Lab 05/26/18 1740 05/27/18 0037  NA 141 140  K 3.6 3.4*  CL 101 105  CO2 19* 21*  GLUCOSE 157* 137*  BUN 50* 50*  CREATININE 2.27* 2.10*  CALCIUM 8.6* 7.3*   Lipid Panel: No results found for: CHOL, TRIG, HDL, CHOLHDL, VLDL, LDLCALC HgbA1c: No results found for: HGBA1C Urine Drug Screen:     Component Value Date/Time   LABOPIA NONE DETECTED 05/26/2018 2130   COCAINSCRNUR NONE DETECTED 05/26/2018 2130   LABBENZ NONE DETECTED 05/26/2018 2130   AMPHETMU NONE DETECTED 05/26/2018 2130   THCU NONE DETECTED 05/26/2018 2130   LABBARB NONE DETECTED 05/26/2018 2130    Alcohol Level     Component Value Date/Time   ETH <10 05/27/2018 0513    IMAGING Ct Head Wo Contrast  Result Date: 05/26/2018 CLINICAL DATA:  Unwitnessed fall yesterday. EXAM: CT HEAD WITHOUT CONTRAST TECHNIQUE: Contiguous axial images were obtained from the base of the skull through the vertex without intravenous contrast. COMPARISON:  None. FINDINGS: Brain: No evidence of acute infarction, hemorrhage, hydrocephalus, extra-axial collection or mass  lesion/mass effect. Unchanged chronic infarcts in the left cerebellum, right basal ganglia, and left thalamus. Stable mild atrophy and chronic microvascular ischemic changes. Vascular: Atherosclerotic vascular calcification of the carotid siphons. No hyperdense vessel. Skull: Negative for fracture or focal lesion. Sinuses/Orbits: Mild paranasal sinus mucosal thickening. No air-fluid levels. The mastoid air cells are clear. The orbits are unremarkable. Other: None. IMPRESSION: 1.  No acute intracranial abnormality. 2. Stable chronic small infarcts, microvascular ischemic changes, and mild atrophy. Electronically Signed   By: Titus Dubin M.D.   On: 05/26/2018 17:42   Dg Chest Portable 1 View  Result Date: 05/26/2018 CLINICAL DATA:  Fever. EXAM: PORTABLE CHEST 1 VIEW COMPARISON:  Chest x-ray dated June 11, 2016. FINDINGS: The patient is rotated to the right, limiting evaluation. Unchanged left chest wall pacemaker. Grossly unchanged cardiomediastinal silhouette. Normal pulmonary vascularity. No focal consolidation, pleural effusion, or pneumothorax. No acute osseous abnormality. IMPRESSION: Limited study due to rotation.  No active disease. Electronically Signed   By: Titus Dubin M.D.   On: 05/26/2018 17:35   Dg Shoulder Right Portable  Result Date: 05/26/2018 CLINICAL DATA:  Fall. EXAM: PORTABLE RIGHT SHOULDER COMPARISON:  Right shoulder x-rays dated April 04, 2010.  FINDINGS: No acute fracture or dislocation. Mild glenohumeral joint space narrowing with small marginal osteophytes, slightly progressed since 2011. The acromioclavicular joint space is preserved. Small marginal acromion osteophytes. Soft tissues are unremarkable. IMPRESSION: 1.  No acute osseous abnormality. 2. Mild glenohumeral osteoarthritis, slightly progressed since 2011. Electronically Signed   By: Titus Dubin M.D.   On: 05/26/2018 17:37    PHYSICAL EXAM General: Appears obese, lying in bed comfortably Psych: Affect  appropriate to situation Eyes: No scleral injection HENT: No OP obstrucion Head: Normocephalic.  Cardiovascular: Normal rate and regular rhythm.  Respiratory: Effort normal and breath sounds normal Skin: WDI  Neurological Examination Mental Status: Alert, oriented to herself and place.  Speech fluent without evidence of aphasia.  No obvious dysarthria noted.  Able to follow 3 step commands without difficulty. Cranial Nerves: II: Visual fields grossly normal,  III,IV, VI: ptosis not present, extra-ocular motions intact bilaterally, pupils equal, round, reactive to light and accommodation V,VII: smile symmetric, facial light touch sensation normal bilaterally VIII: hearing normal bilaterally IX,X: uvula rises symmetrically XI: bilateral shoulder shrug XII: midline tongue extension Motor: Right :  Upper extremity   4+/5                                                Left:     Upper extremity   4/5             Lower extremity   4+/5                                                  Lower extremity   4/5 Tone and bulk:normal tone throughout; no atrophy noted Sensory: Pinprick and light touch intact throughout, bilaterally Plantars: Right: downgoing                                Left: downgoing Cerebellar: normal finger-to-nose slightly impaired due to bilateral shoulder immobility Gait: 2+ assist with stand-pivot   ASSESSMENT/PLAN Ms. Isaura DAWNNA GRITZ is a 85 y.o. female with history of CHD s/p pacer, CVA, HTN, HLD presenting after being found down at home. In ED found to have slurred speech and LUE weakness, L facial droop along with rhabdomylosis. Neuro sx cleared by time of consult.   Stroke:   Possible L brain infarct workup underway  CT head No acute stroke. Small vessel disease. Atrophy.   Repeat CT no acute stroke. Atrophy. Small vessel disease.  Consider CTA head & neck when Cr improves  No MRI d/t pacer  Carotid Doppler  pending   2D Echo  pending   Pacer -  Medtronic - interrogation pending   LDL pending   HgbA1c pending   Heparin 5000 units sq tid for VTE prophylaxis  aspirin 81 mg daily and clopidogrel 75 mg daily prior to admission, now on aspirin 81 mg daily and clopidogrel 75 mg daily. Continue DAPT.  Therapy recommendations:  SNF  Disposition:  pending   Followed by DR. Willis as OP for gait d/o, hyperreflexia d/t old strokes  Hypotension Hx Hypertension  Home meds: HCTZ 75 , currently on hold . Permissive hypertension (OK if < 220/120) but gradually  normalize in 5-7 days . Avoid hypotension . Long-term BP goal normotensive  Hyperlipidemia  Home meds:  zocor 40  Statin on hold by IM d/t transaminitis  LDL pending, goal < 70 given hx stroke  Resume statin once transaminitis stable  Other Stroke Risk Factors  Advanced age  Former Cigarette smoker, quit 11 yrs ago  Obesity, Body mass index is 37.1 kg/m., recommend weight loss, diet and exercise as appropriate   Hx stroke/TIA - cortical, subcortical, thalamic, and brainstem infarcts with resultant gait d/o and hyperreflexia   CHB w/ Pacer - Medtronic - interrogation neg for AF or arrhythmia   Other Active Problems  Falls  Rhabdomylosis, CK [phone]  AKI Cr 2.10  Elevated troponin  Transaminitis. Suspect shock liver. Statin on hold  Hypokalemia K 3.4  Bacteremia. Blood culture 3/ w/ kleb pneumo & enterobacteriaceae species. On ceftriaxone   Hospital day # [place], MSN, APRN, ANVP-BC, AGPCNP-BC Advanced Practice Stroke Nurse [place] for Schedule & Pager information 05/27/2018 5:51 PM   ATTENDING NOTE: I reviewed above note and agree with the assessment and plan. Pt was seen and examined.   85 year old female with history of CHF, heart block status post pacemaker, hypertension, hyperlipidemia admitted for fall at home for a day with slurred speech and left-sided weakness and left facial droop.  CT no acute  abnormality but chronic infarcts at left cerebellum, right caudate and left thalamus.  2D echo pending.  Not able to do MRI due to pacemaker or CTA given elevated creatinine.  Carotid Doppler unremarkable. LDL and A1c pending.  CT repeat pending.  Pacemaker interrogation showed no A. fib or a flutter.  Patient has been following with Dr. Jannifer Franklin at [facility] for gait disorder, rule out the myelopathy and considered etiology of chronic cerebrovascular disease.  She will continue follow-up with Dr. Jannifer Franklin at [facility] after discharge.  She was also found to have dehydration, rhabdomyolysis, and AKI.  Treated with IV fluid.  She also was found to have fever with temperature one 101.3 and chills.  UA showed WBC 21-50, treated with Rocephin.  During rounds this morning, patient awake alert, orientated x3, fluent speech, no aphasia, neurologically intact without focal deficit.  However, patient had again fever and chills with temperature 100.5.  Blood culture pending.  Patient episode of slurred speech and left-sided weakness could be due to recrudescence of previous chronic stroke in the setting of infection, AKI, rhabdomyolysis and dehydration.  Recommend continue aspirin and Plavix and Zocor as home medication.  Continue IV fluid and antibiotics.  Will follow  Rosalin Hawking, MD PhD Stroke Neurology 05/27/2018 7:35 PM  I spent  35 minutes in total face-to-face time with the patient, more than 50% of which was spent in counseling and coordination of care, reviewing test results, images and medication, and discussing the diagnosis of recrudescence of old stroke, AKI, dehydration, rhabdomyolysis and UTI, treatment plan and potential prognosis. This patient's care requiresreview of multiple databases, neurological assessment, discussion with family, other specialists and medical decision making of high complexity. I had long discussion with patient and granddaughters at bedside, updated pt current condition, treatment plan  and potential prognosis. They expressed understanding and appreciation.  I also discussed with Dr. Sloan Leiter.         To contact Stroke Continuity provider, please refer to [email or web address] After hours, contact General Neurology

## 2018-05-27 NOTE — Evaluation (Signed)
Occupational Therapy Evaluation Patient Details Name: [name] MRN: [id number] DOB: 10-Jul-1933 Today's Date: 05/27/2018    History of Present Illness Pt is a 85 y.o. female admitted 05/26/18 after being found down at home from fall; per report, pt with slurred speech and L-side weakness. Head CT negative for acute abnormality; stable, chronic small infarcts. R shoulder xray negative for acute fx. Worked up for rhabdomyolysis and AKI. PMH includes pacemaker, HTN, CVA, CHF, HLD.   Clinical Impression   Pt with decline in function and safety with ADLs and ADL mobility. PTA pt lived at home alone with assist 2 hrs/day, 5 days/wk for bathing and home mgt. Pt's daughter lives 30 minutes away and sometimes comes to stay with her. Pt uses RW at home and was independent with dressing and toileting per pt report. Pt limited by pain and decreased AROM in R shoulder from her fall at home. Pt would benefit form acute OT services to address impairments to maximize level of function and safety    Follow Up Recommendations  SNF    Equipment Recommendations  Other (comment)(TBD at next venue of care)    Recommendations for Other Services       Precautions / Restrictions Precautions Precautions: Fall Restrictions Weight Bearing Restrictions: No      Mobility Bed Mobility Overal bed mobility: Needs Assistance Bed Mobility: Supine to Sit     Supine to sit: Min assist;HOB elevated        Transfers Overall transfer level: Needs assistance Equipment used: Rolling walker (2 wheeled) Transfers: Sit to/from Stand Sit to Stand: Min assist;Mod assist         General transfer comment: Cues for correct hand placement. MinA to assist trunk elevation with standing; pt attempting to sit prematurely backing up to chair, requiring modA to prevent this and fall    Balance Overall balance assessment: Needs assistance Sitting-balance support: Feet supported Sitting balance-Leahy Scale: Fair     Standing balance support: Bilateral upper extremity supported;During functional activity Standing balance-Leahy Scale: Poor                             ADL either performed or assessed with clinical judgement   ADL Overall ADL's : Needs assistance/impaired     Grooming: Wash/dry hands;Wash/dry face;Sitting;Min guard   Upper Body Bathing: Moderate assistance;Sitting   Lower Body Bathing: Maximal assistance   Upper Body Dressing : Moderate assistance;Sitting   Lower Body Dressing: Total assistance   Toilet Transfer: Moderate assistance;Minimal assistance;Cueing for safety;RW;Ambulation   Toileting- Clothing Manipulation and Hygiene: Total assistance       Functional mobility during ADLs: Moderate assistance;Minimal assistance;Cueing for safety;Rolling walker General ADL Comments: pt with max verbl cues for hand placement using RW. UB ADLs impaired due to pain/AROM in R shoulder     Vision Baseline Vision/History: Wears glasses Wears Glasses: At all times Patient Visual Report: No change from baseline       Perception     Praxis      Pertinent Vitals/Pain Pain Assessment: 0-10 Pain Score: 6  Faces Pain Scale: Hurts little more Pain Location: R shoulder Pain Descriptors / Indicators: Sore;Guarding Pain Intervention(s): Limited activity within patient's tolerance;Monitored during session;Repositioned     Hand Dominance Right   Extremity/Trunk Assessment Upper Extremity Assessment Upper Extremity Assessment: Generalized weakness;RUE deficits/detail RUE Deficits / Details: shoulder impaired due to pain from falling at home   Lower Extremity Assessment Lower Extremity Assessment: Defer to  PT evaluation       Communication Communication Communication: No difficulties   Cognition Arousal/Alertness: Awake/alert Behavior During Therapy: WFL for tasks assessed/performed Overall Cognitive Status: Impaired/Different from baseline Area of Impairment:  Memory;Following commands;Safety/judgement;Awareness;Problem solving                     Memory: Decreased short-term memory Following Commands: Follows multi-step commands with increased time Safety/Judgement: Decreased awareness of deficits;Decreased awareness of safety Awareness: Emergent Problem Solving: Slow processing;Requires verbal cues General Comments: Difficulty answering some questions and remembering details of home set-up   General Comments  Daughter present. SpO2 >90% on RA    Exercises     Shoulder Instructions      Home Living Family/patient expects to be discharged to:: Private residence Living Arrangements: Alone Available Help at Discharge: Family Type of Home: House Home Access: Stairs to enter Technical brewer of Steps: 1   Home Layout: One level     Bathroom Shower/Tub: Teacher, early years/pre: Standard     Home Equipment: Toilet riser;Walker - 2 wheels;Tub bench   Additional Comments: Daughter lives 30 min away but will stay with pt some times      Prior Functioning/Environment Level of Independence: Independent with assistive device(s);Needs assistance  Gait / Transfers Assistance Needed: Ambulatory with RW, although does not always use inside home. [employer] on Wheels, but able to fix simple meals ADL's / Homemaking Assistance Needed: Aide helps M-F for 2 hrs/day with bathing, household tasks            OT Problem List: Decreased strength;Decreased activity tolerance;Decreased cognition;Decreased knowledge of use of DME or AE;Pain;Obesity;Impaired balance (sitting and/or standing);Decreased range of motion;Decreased safety awareness;Decreased knowledge of precautions      OT Treatment/Interventions: Self-care/ADL training;DME and/or AE instruction;Therapeutic activities;Patient/family education;Therapeutic exercise    OT Goals(Current goals can be found in the care plan section) Acute Rehab OT Goals Patient  Stated Goal: Return home OT Goal Formulation: With patient/family Time For Goal Achievement: 06/10/18 Potential to Achieve Goals: Good ADL Goals Pt Will Perform Grooming: with min guard assist;with supervision;with set-up;standing Pt Will Perform Upper Body Bathing: with supervision;with set-up;sitting Pt Will Perform Lower Body Bathing: with mod assist;sitting/lateral leans Pt Will Perform Upper Body Dressing: with supervision;sitting Pt Will Transfer to Toilet: with min assist;ambulating;regular height toilet;bedside commode;grab bars Pt Will Perform Toileting - Clothing Manipulation and hygiene: with max assist;with mod assist;sit to/from stand  OT Frequency: Min 2X/week   Barriers to D/C: Decreased caregiver support          Co-evaluation              AM-PAC OT "6 Clicks" Daily Activity     Outcome Measure Help from another person eating meals?: None Help from another person taking care of personal grooming?: A Little Help from another person toileting, which includes using toliet, bedpan, or urinal?: Total Help from another person bathing (including washing, rinsing, drying)?: Total Help from another person to put on and taking off regular upper body clothing?: A Lot Help from another person to put on and taking off regular lower body clothing?: Total 6 Click Score: 12   End of Session Equipment Utilized During Treatment: Gait belt;Rolling walker  Activity Tolerance: Patient tolerated treatment well Patient left: in chair;with call bell/phone within reach;with chair alarm set;with family/visitor present  OT Visit Diagnosis: Unsteadiness on feet (R26.81);Other abnormalities of gait and mobility (R26.89);History of falling (Z91.81);Muscle weakness (generalized) (M62.81);Pain;Other symptoms and signs involving cognitive function  Pain - Right/Left: Right Pain - part of body: Shoulder                Time: 1013-1040 OT Time Calculation (min): 27 min Charges:  OT General  Charges $OT Visit: 1 Visit OT Evaluation $OT Eval Moderate Complexity: 1 Mod OT Treatments $Self Care/Home Management : 8-22 mins    [name] 05/27/2018, 12:49 PM

## 2018-05-27 NOTE — Progress Notes (Signed)
PHARMACY - PHYSICIAN COMMUNICATION CRITICAL VALUE ALERT - BLOOD CULTURE IDENTIFICATION (BCID)  [name] is an 85 y.o. female who presented to [facility] on 05/26/2018 with a chief complaint of sepsis.   Assessment: Pt admitted after a fall. Labs called with BCID result today>>3/4 bottles with kleb pneumo. No KPC detected.   Name of physician (or Provider) Contacted: Dr. Sloan Leiter through chat  Current antibiotics: Ceftriaxone  Changes to prescribed antibiotics recommended:  Cont Ceftriaxone  Results for orders placed or performed during the hospital encounter of 05/26/18  Blood Culture ID Panel (Reflexed) (Collected: 05/26/2018  6:42 PM)  Result Value Ref Range   Enterococcus species NOT DETECTED NOT DETECTED   Listeria monocytogenes NOT DETECTED NOT DETECTED   Staphylococcus species NOT DETECTED NOT DETECTED   Staphylococcus aureus (BCID) NOT DETECTED NOT DETECTED   Streptococcus species NOT DETECTED NOT DETECTED   Streptococcus agalactiae NOT DETECTED NOT DETECTED   Streptococcus pneumoniae NOT DETECTED NOT DETECTED   Streptococcus pyogenes NOT DETECTED NOT DETECTED   Acinetobacter baumannii NOT DETECTED NOT DETECTED   Enterobacteriaceae species DETECTED (A) NOT DETECTED   Enterobacter cloacae complex NOT DETECTED NOT DETECTED   Escherichia coli NOT DETECTED NOT DETECTED   Klebsiella oxytoca NOT DETECTED NOT DETECTED   Klebsiella pneumoniae DETECTED (A) NOT DETECTED   Proteus species NOT DETECTED NOT DETECTED   Serratia marcescens NOT DETECTED NOT DETECTED   Carbapenem resistance NOT DETECTED NOT DETECTED   Haemophilus influenzae NOT DETECTED NOT DETECTED   Neisseria meningitidis NOT DETECTED NOT DETECTED   Pseudomonas aeruginosa NOT DETECTED NOT DETECTED   Candida albicans NOT DETECTED NOT DETECTED   Candida glabrata NOT DETECTED NOT DETECTED   Candida krusei NOT DETECTED NOT DETECTED   Candida parapsilosis NOT DETECTED NOT DETECTED   Candida tropicalis NOT  DETECTED NOT DETECTED   Onnie Boer, PharmD, BCIDP, AAHIVP, CPP Infectious Disease Pharmacist 05/27/2018 9:10 AM

## 2018-05-27 NOTE — Progress Notes (Signed)
SLP Cancellation Note  Patient Details Name: DALLYS NOWAKOWSKI MRN: [id number] DOB: 27-Nov-1933   Cancelled treatment:       Reason Eval/Treat Not Completed: Other (comment). Pt admitted for stroke w/u, passed [facility] swallow screen, placed on diet. No need for SLP swallow eval unless further concerns for dysphagia arise. Will d/c order at this time, please reorder if needed.    Miral Hoopes, Katherene Ponto 05/27/2018, 7:50 AM

## 2018-05-27 NOTE — Evaluation (Signed)
Physical Therapy Evaluation Patient Details Name: [name] MRN: [id number] DOB: [birth year] Today's Date: 05/27/2018   History of Present Illness  Pt is a 85 y.o. female admitted 05/26/18 after being found down at home from fall; per report, pt with slurred speech and L-side weakness. Head CT negative for acute abnormality; stable, chronic small infarcts. R shoulder xray negative for acute fx. Worked up for rhabdomyolysis and AKI. PMH includes pacemaker, HTN, CVA, CHF, HLD.    Clinical Impression  Pt presents with an overall decrease in functional mobility secondary to above. PTA, pt lives alone, mod indep ambulating with RW, has aide assist 5x/wk for household tasks and bathing. Today, pt required minA to stand and amb short distance with RW; pt limited by generalized weakness, decreased activity tolerance, and slowed problem solving. At high risk for falls and hospital readmission. Pt would benefit from continued acute PT services to maximize functional mobility and independence prior to d/c with SNF-level therapies.     Follow Up Recommendations SNF;Supervision for mobility/OOB    Equipment Recommendations  None recommended by PT    Recommendations for Other Services       Precautions / Restrictions Precautions Precautions: Fall Restrictions Weight Bearing Restrictions: No      Mobility  Bed Mobility Overal bed mobility: Needs Assistance Bed Mobility: Supine to Sit     Supine to sit: Min assist;HOB elevated        Transfers Overall transfer level: Needs assistance Equipment used: Rolling walker (2 wheeled) Transfers: Sit to/from Stand Sit to Stand: Min assist;Mod assist         General transfer comment: Cues for correct hand placement. MinA to assist trunk elevation with standing; pt attempting to sit prematurely backing up to chair, requiring modA to prevent this and fall  Ambulation/Gait Ambulation/Gait assistance: Min assist Gait Distance (Feet): 14  Feet Assistive device: Rolling walker (2 wheeled) Gait Pattern/deviations: Step-to pattern;Shuffle;Trunk flexed;Leaning posteriorly Gait velocity: Decreased Gait velocity interpretation: <1.31 ft/sec, indicative of household ambulator General Gait Details: Unsteady, shuffling steps in room with RW and intermittent minA to maintain balance; frequent cues for sequencing, and intermittent physical assist to move RW. Pt at times staring blankly; attempt to sit premature  Stairs            Wheelchair Mobility    Modified Rankin (Stroke Patients Only) Modified Rankin (Stroke Patients Only) Pre-Morbid Rankin Score: No symptoms Modified Rankin: Moderately severe disability     Balance Overall balance assessment: Needs assistance   Sitting balance-Leahy Scale: Fair       Standing balance-Leahy Scale: Poor                               Pertinent Vitals/Pain Pain Assessment: Faces Faces Pain Scale: Hurts little more Pain Location: R shoulder Pain Descriptors / Indicators: Sore;Guarding    Home Living Family/patient expects to be discharged to:: Private residence Living Arrangements: Alone Available Help at Discharge: Family Type of Home: House Home Access: Stairs to enter   Technical brewer of Steps: 1 Home Layout: One level Home Equipment: Toilet riser;Walker - 2 wheels;Tub bench Additional Comments: Daughter lives 30 min away but will stay with pt some times    Prior Function Level of Independence: Independent with assistive device(s);Needs assistance   Gait / Transfers Assistance Needed: Ambulatory with RW, although does not always use inside home. [employer] on Wheels, but able to fix simple meals  ADL's / [employer]  Assistance Needed: Aide helps M-F for 2 hrs/day with bathing, household tasks        Hand Dominance        Extremity/Trunk Assessment   Upper Extremity Assessment Upper Extremity Assessment: Generalized weakness(c/o R  shoulder pain after falling on it; shoulder flex/abd to at least 90')    Lower Extremity Assessment Lower Extremity Assessment: Generalized weakness       Communication   Communication: No difficulties  Cognition Arousal/Alertness: Awake/alert Behavior During Therapy: WFL for tasks assessed/performed Overall Cognitive Status: Impaired/Different from baseline Area of Impairment: Memory;Following commands;Safety/judgement;Awareness;Problem solving                     Memory: Decreased short-term memory Following Commands: Follows multi-step commands with increased time Safety/Judgement: Decreased awareness of deficits;Decreased awareness of safety Awareness: Emergent Problem Solving: Slow processing;Requires verbal cues General Comments: Difficulty answering some questions and remembering details of home set-up      General Comments General comments (skin integrity, edema, etc.): Daughter present. SpO2 >90% on RA    Exercises     Assessment/Plan    PT Assessment Patient needs continued PT services  PT Problem List Decreased strength;Decreased activity tolerance;Decreased balance;Decreased mobility;Decreased safety awareness       PT Treatment Interventions DME instruction;Gait training;Functional mobility training;Therapeutic activities;Therapeutic exercise;Balance training;Patient/family education    PT Goals (Current goals can be found in the Care Plan section)  Acute Rehab PT Goals Patient Stated Goal: Return home PT Goal Formulation: With patient Time For Goal Achievement: 06/10/18 Potential to Achieve Goals: Fair    Frequency Min 2X/week   Barriers to discharge Decreased caregiver support      Co-evaluation               AM-PAC PT "6 Clicks" Mobility  Outcome Measure Help needed turning from your back to your side while in a flat bed without using bedrails?: A Little Help needed moving from lying on your back to sitting on the side of a flat  bed without using bedrails?: A Little Help needed moving to and from a bed to a chair (including a wheelchair)?: A Little Help needed standing up from a chair using your arms (e.g., wheelchair or bedside chair)?: A Little Help needed to walk in hospital room?: A Little Help needed climbing 3-5 steps with a railing? : A Lot 6 Click Score: 17    End of Session Equipment Utilized During Treatment: Gait belt Activity Tolerance: Patient tolerated treatment well;Patient limited by fatigue Patient left: in chair;with call bell/phone within reach;with chair alarm set;with family/visitor present Nurse Communication: Mobility status PT Visit Diagnosis: Other abnormalities of gait and mobility (R26.89);Muscle weakness (generalized) (M62.81)    Time: 1040-1057 PT Time Calculation (min) (ACUTE ONLY): 17 min   Charges:   PT Evaluation $PT Eval Moderate Complexity: [place], PT, DPT Acute Rehabilitation Services  Pager [phone] Office [place] 05/27/2018, 11:15 AM

## 2018-05-27 NOTE — Progress Notes (Signed)
PROGRESS NOTE  [name] MWU:[id number] DOB: 08-Jul-1933 DOA: 05/26/2018 PCP: Randel Books, FNP  HPI/Brief Narrative  [name] is a 85 y.o. year old female with medical history significant for CVA, HLD, HTN, pacemaker who presented on 05/26/2018 after falling on Sunday morning with > 24 hour downtime and was admitted for evaluation and management of rhabdomyolysis, AKI, left-sided weakness, and hypotension. Patient states she felt off balance when walking to the bathroom Sunday morning with her walker and subsequently fell. She was unable to get up on her own and was down on the floor until Monday morning when her family found her. She did urinate on herself while she was down, but denies any LOC.   Hospital Course: Stroke work up was initiated since patient had left-sided weakness and facial droop in the ED. CT of the head did not show any evidence of acute stroke. Patient was found to have rhabdomyolysis and AKI and was started on IV fluids. She has also been hypotensive since being in hospital. She was noted to have elevated temp of 101.3 F early this morning, but has been afebrile since. Her blood cultures from 1/27 have already grown Enterobacteriaceae species and Klebsiella Pneumoniae and she has been started on IV Rocephin.   Subjective Patient is doing well today. She states her left-sided weakness has been improving. Per pt's daughter and granddaughter, the pt's speech is back to baseline. She denies any nausea, vomiting, or abdominal pain. She denies any CP or SOB.  Assessment/Plan:  1. Rhabdomyolysis- Improving. Patient had fall with >24 hour downtime. Her CK total has been trending downward with IV fluids given. Will continue to monitor CK and continue IV fluids.   2. AKI- Improving. Likely due to to patient's rhabdomyolysis as well as hypotension. Creatinine has mildly improved from yesterday. Will continue IV fluids and continue to monitor BMP.   3.  Left-sided weakness with hx of CVA- Improving. Patient is on Plavix and ASA at home due to hx of CVA. She was found to have left-sided weakness and facial droop in the ED. Neurology was consulted and stroke work-up was started. Her CT head did not show any evidence of acute stroke or hemorrhage. There is still a possibility the patient had a mild stroke, but she is unable to have MRI of the brain due to her pacemaker and is unable to have CT with contrast due to her AKI. Neurology is recommending repeat CT in 24 hours. Patient passed yale swallow screen. She does not have any dysphagia or facial droop today. Her left-sided weakness in improving. Awaiting PT and OT eval. Continue ASA and Plavix.   4. Bacteremia- Patient's 1/27 blood cultures have already grown Enterobacteriaceae Species and Klebsiella Pneumoniae. She has been started on IV Rocephin. She did have one elevated temperature early this morning of 101.3 F, but has been afebrile since. She states she felt "unwell" the night before her fall, but denies any fever or chills at home. No evidence of leukocytosis. Will continue to monitor blood cultures and temperature.   5. Hypotension- Patient continues to be hypotensive today. Will continue to hold her home HTN medications of Labetalol and HCTZ. Will continue IV fluids and monitoring her BP.   6. Frequent Falls at home- Patient has been experiencing falls at home. She states her fall on Sunday was due to losing her balance. She currently lives alone. Will have patient be evaluated by PT for possible SNF placement after discharge. Will continue monitoring  with telemetry.   7. Transaminitis-Improving. Pt's LFT's are trending downwards. Likely due to pt's rhabdomyolysis. Patient's Simvastatin is currently being held. Will continue to monitor LFT's.   8. Elevated Troponin- Serial Troponin have been 1.08 and 0.70 thus far. She is chest pain free and denies SOB. Will continue to trend Troponin and monitor  telemetry.   9. [employer] Pacemaker for Bradycardia- Stable. Will continue to monitor telemetry.   10. HLD- Stable. Pt's Simvastatin is currently being held due to transaminitis.   Cultures:  Blood cultures from 1/27 grew Enterobacteriaceae Species and Klebsiella Pneumoniae   Telemetry: Patient is on Telemetry   DVT prophylaxis: Heparin  Consultants:  Neurology, PT, OT    Procedures:  Echo, Carotid Dopplers    Antimicrobials:  Code Status: FULL   Family Communication: Patient's daughter and granddaughter at bedside. Updated on plan.   Disposition Plan: Remain inpatient. Anticipate discharge to SNF with continued clinical improvement.   Objective: Vitals:   05/27/18 0406 05/27/18 0526 05/27/18 0538 05/27/18 0800  BP: (!) 99/56   94/80  Pulse: 60   (!) 55  Resp: (!) 25   18  Temp:  (!) 101.3 F (38.5 C)  98.5 F (36.9 C)  TempSrc:  Oral  Oral  SpO2: 98%   94%  Weight:   95 kg   Height:        Intake/Output Summary (Last 24 hours) at 05/27/2018 5631 Last data filed at 05/27/2018 0910 Gross per 24 hour  Intake 4720.15 ml  Output -  Net 4720.15 ml   Filed Weights   05/26/18 2000 05/27/18 0538  Weight: 93.4 kg 95 kg    Exam: Constitutional:normal appearing female Eyes: EOMI, anicteric, normal conjunctivae ENMT: Oropharynx with moist mucous membranes, normal dentition Neck: FROM Cardiovascular: RRR no MRGs, with no peripheral edema, DP and PT pulses intact  Respiratory: Normal respiratory effort, clear breath sounds  Abdomen: Soft,non-tender, with no HSM Skin: No rash ulcers, or lesions.  Neurologic: Grossly no focal neuro deficit. CN II-XII appear intact. No evidence of any facial droop or dysphagia.  Psychiatric: Appropriate affect, and mood. Mental status AAOx3  Data Reviewed: CBC: Recent Labs  Lab 05/26/18 1740 05/27/18 0037  WBC 7.3 8.0  NEUTROABS 6.2  --   HGB 14.8 12.5  HCT 48.3* 40.6  MCV 86.1 85.5  PLT 159 497*   Basic Metabolic  Panel: Recent Labs  Lab 05/26/18 1740 05/27/18 0037  NA 141 140  K 3.6 3.4*  CL 101 105  CO2 19* 21*  GLUCOSE 157* 137*  BUN 50* 50*  CREATININE 2.27* 2.10*  CALCIUM 8.6* 7.3*   GFR: Estimated Creatinine Clearance: 22.6 mL/min (A) (by C-G formula based on SCr of 2.1 mg/dL (H)). Liver Function Tests: Recent Labs  Lab 05/26/18 1740 05/27/18 0037  AST 446* 366*  ALT 170* 148*  ALKPHOS 52 41  BILITOT 2.4* 2.0*  PROT 7.0 6.5  ALBUMIN 3.0* 2.5*   No results for input(s): LIPASE, AMYLASE in the last 168 hours. No results for input(s): AMMONIA in the last 168 hours. Coagulation Profile: Recent Labs  Lab 05/26/18 1740  INR 1.27   Cardiac Enzymes: Recent Labs  Lab 05/26/18 0035 05/26/18 1740 05/27/18 0037 05/27/18 0755  CKTOTAL  --  02,637* 18,280*  --   TROPONINI 1.08*  --   --  0.70*   BNP (last 3 results) No results for input(s): PROBNP in the last 8760 hours. HbA1C: No results for input(s): HGBA1C in the last 72 hours. CBG:  Recent Labs  Lab 05/26/18 1910  GLUCAP 132*   Lipid Profile: No results for input(s): CHOL, HDL, LDLCALC, TRIG, CHOLHDL, LDLDIRECT in the last 72 hours. Thyroid Function Tests: No results for input(s): TSH, T4TOTAL, FREET4, T3FREE, THYROIDAB in the last 72 hours. Anemia Panel: No results for input(s): VITAMINB12, FOLATE, FERRITIN, TIBC, IRON, RETICCTPCT in the last 72 hours. Urine analysis:    Component Value Date/Time   COLORURINE AMBER (A) 05/26/2018 2130   APPEARANCEUR HAZY (A) 05/26/2018 2130   LABSPEC 1.017 05/26/2018 2130   PHURINE 5.0 05/26/2018 2130   GLUCOSEU NEGATIVE 05/26/2018 2130   HGBUR LARGE (A) 05/26/2018 2130   BILIRUBINUR NEGATIVE 05/26/2018 2130   KETONESUR 20 (A) 05/26/2018 2130   PROTEINUR 100 (A) 05/26/2018 2130   NITRITE NEGATIVE 05/26/2018 2130   LEUKOCYTESUR TRACE (A) 05/26/2018 2130   Sepsis Labs: @LABRCNTIP (procalcitonin:4,lacticidven:4)  ) Recent Results (from the past 240 hour(s))  Blood  culture (routine x 2)     Status: None (Preliminary result)   Collection Time: 05/26/18  6:42 PM  Result Value Ref Range Status   Specimen Description BLOOD SITE NOT SPECIFIED  Final   Special Requests   Final    BOTTLES DRAWN AEROBIC AND ANAEROBIC Blood Culture results may not be optimal due to an inadequate volume of blood received in culture bottles   Culture  Setup Time   Final    GRAM NEGATIVE RODS IN BOTH AEROBIC AND ANAEROBIC BOTTLES CRITICAL RESULT CALLED TO, READ BACK BY AND VERIFIED WITH: PHAN PHARMD AT 0902 ON [id number] BY SJW Performed at [facility], [place] [address] [place], [place] [address]    Culture GRAM NEGATIVE RODS  Final   Report Status PENDING  Incomplete  Blood culture (routine x 2)     Status: None (Preliminary result)   Collection Time: 05/26/18  6:42 PM  Result Value Ref Range Status   Specimen Description BLOOD BLOOD RIGHT FOREARM  Final   Special Requests   Final    BOTTLES DRAWN AEROBIC AND ANAEROBIC Blood Culture results may not be optimal due to an inadequate volume of blood received in culture bottles   Culture  Setup Time   Final    GRAM NEGATIVE RODS ANAEROBIC BOTTLE ONLY Performed at [facility], [place] [address] [place], [place] [address]    Culture GRAM NEGATIVE RODS  Final   Report Status PENDING  Incomplete  Blood Culture ID Panel (Reflexed)     Status: Abnormal   Collection Time: 05/26/18  6:42 PM  Result Value Ref Range Status   Enterococcus species NOT DETECTED NOT DETECTED Final   Listeria monocytogenes NOT DETECTED NOT DETECTED Final   Staphylococcus species NOT DETECTED NOT DETECTED Final   Staphylococcus aureus (BCID) NOT DETECTED NOT DETECTED Final   Streptococcus species NOT DETECTED NOT DETECTED Final   Streptococcus agalactiae NOT DETECTED NOT DETECTED Final   Streptococcus pneumoniae NOT DETECTED NOT DETECTED Final   Streptococcus pyogenes NOT DETECTED NOT DETECTED Final   Acinetobacter baumannii NOT DETECTED NOT  DETECTED Final   Enterobacteriaceae species DETECTED (A) NOT DETECTED Final    Comment: Enterobacteriaceae represent a large family of gram-negative bacteria, not a single organism. CRITICAL RESULT CALLED TO, READ BACK BY AND VERIFIED WITH: PHAN PHARMD AT 0902 ON [id number] BY SJW    Enterobacter cloacae complex NOT DETECTED NOT DETECTED Final   Escherichia coli NOT DETECTED NOT DETECTED Final   Klebsiella oxytoca NOT DETECTED NOT DETECTED Final   Klebsiella pneumoniae DETECTED (  A) NOT DETECTED Final    Comment: CRITICAL RESULT CALLED TO, READ BACK BY AND VERIFIED WITH: PHAN PHARMD AT 1829 ON [id number] BY SJW    Proteus species NOT DETECTED NOT DETECTED Final   Serratia marcescens NOT DETECTED NOT DETECTED Final   Carbapenem resistance NOT DETECTED NOT DETECTED Final   Haemophilus influenzae NOT DETECTED NOT DETECTED Final   Neisseria meningitidis NOT DETECTED NOT DETECTED Final   Pseudomonas aeruginosa NOT DETECTED NOT DETECTED Final   Candida albicans NOT DETECTED NOT DETECTED Final   Candida glabrata NOT DETECTED NOT DETECTED Final   Candida krusei NOT DETECTED NOT DETECTED Final   Candida parapsilosis NOT DETECTED NOT DETECTED Final   Candida tropicalis NOT DETECTED NOT DETECTED Final    Comment: Performed at [facility], [place] [address], [place], [place] [address]  MRSA PCR Screening     Status: None   Collection Time: 05/26/18 11:48 PM  Result Value Ref Range Status   MRSA by PCR NEGATIVE NEGATIVE Final    Comment:        The GeneXpert MRSA Assay (FDA approved for NASAL specimens only), is one component of a comprehensive MRSA colonization surveillance program. It is not intended to diagnose MRSA infection nor to guide or monitor treatment for MRSA infections. Performed at [facility], [place] [address] [place],  [address]       Studies: Ct Head Wo Contrast  Result Date: 05/26/2018 CLINICAL DATA:  Unwitnessed fall yesterday. EXAM: CT HEAD WITHOUT  CONTRAST TECHNIQUE: Contiguous axial images were obtained from the base of the skull through the vertex without intravenous contrast. COMPARISON:  None. FINDINGS: Brain: No evidence of acute infarction, hemorrhage, hydrocephalus, extra-axial collection or mass lesion/mass effect. Unchanged chronic infarcts in the left cerebellum, right basal ganglia, and left thalamus. Stable mild atrophy and chronic microvascular ischemic changes. Vascular: Atherosclerotic vascular calcification of the carotid siphons. No hyperdense vessel. Skull: Negative for fracture or focal lesion. Sinuses/Orbits: Mild paranasal sinus mucosal thickening. No air-fluid levels. The mastoid air cells are clear. The orbits are unremarkable. Other: None. IMPRESSION: 1.  No acute intracranial abnormality. 2. Stable chronic small infarcts, microvascular ischemic changes, and mild atrophy. Electronically Signed   By: Titus Dubin M.D.   On: 05/26/2018 17:42   Dg Chest Portable 1 View  Result Date: 05/26/2018 CLINICAL DATA:  Fever. EXAM: PORTABLE CHEST 1 VIEW COMPARISON:  Chest x-ray dated June 11, 2016. FINDINGS: The patient is rotated to the right, limiting evaluation. Unchanged left chest wall pacemaker. Grossly unchanged cardiomediastinal silhouette. Normal pulmonary vascularity. No focal consolidation, pleural effusion, or pneumothorax. No acute osseous abnormality. IMPRESSION: Limited study due to rotation.  No active disease. Electronically Signed   By: Titus Dubin M.D.   On: 05/26/2018 17:35   Dg Shoulder Right Portable  Result Date: 05/26/2018 CLINICAL DATA:  Fall. EXAM: PORTABLE RIGHT SHOULDER COMPARISON:  Right shoulder x-rays dated April 04, 2010. FINDINGS: No acute fracture or dislocation. Mild glenohumeral joint space narrowing with small marginal osteophytes, slightly progressed since 2011. The acromioclavicular joint space is preserved. Small marginal acromion osteophytes. Soft tissues are unremarkable. IMPRESSION: 1.   No acute osseous abnormality. 2. Mild glenohumeral osteoarthritis, slightly progressed since 2011. Electronically Signed   By: Titus Dubin M.D.   On: 05/26/2018 17:37    Scheduled Meds: . aspirin  81 mg Oral Daily  . chlorhexidine  15 mL Mouth Rinse BID  . clopidogrel  75 mg Oral Q breakfast  . heparin  5,000  Units Subcutaneous Q8H  . mouth rinse  15 mL Mouth Rinse q12n4p  . simvastatin  40 mg Oral QHS  . sodium chloride flush  3 mL Intravenous Q12H    Continuous Infusions: . cefTRIAXone (ROCEPHIN)  IV 2 g (05/27/18 0907)     LOS: 1 day    Romie Minus, PA-S

## 2018-05-27 NOTE — Progress Notes (Signed)
Carotid duplex has been completed.   Preliminary results in CV Proc.   [name] 05/27/2018 11:48 AM

## 2018-05-28 ENCOUNTER — Inpatient Hospital Stay (HOSPITAL_COMMUNITY): Payer: Medicare Other

## 2018-05-28 ENCOUNTER — Other Ambulatory Visit (HOSPITAL_COMMUNITY): Payer: Medicare Other

## 2018-05-28 DIAGNOSIS — I10 Essential (primary) hypertension: Secondary | ICD-10-CM

## 2018-05-28 LAB — LIPID PANEL
Cholesterol: 79 mg/dL (ref 0–200)
HDL: 12 mg/dL — ABNORMAL LOW (ref >40)
LDL Cholesterol: 40 mg/dL (ref 0–99)
Total CHOL/HDL Ratio: 6.6 RATIO
Triglycerides: 136 mg/dL (ref <150)
VLDL: 27 mg/dL (ref 0–40)

## 2018-05-28 LAB — COMPREHENSIVE METABOLIC PANEL
ALT: 143 U/L — ABNORMAL HIGH (ref 0–44)
AST: 237 U/L — ABNORMAL HIGH (ref 15–41)
Albumin: 2.2 g/dL — ABNORMAL LOW (ref 3.5–5.0)
Alkaline Phosphatase: 40 U/L (ref 38–126)
Anion gap: 12 (ref 5–15)
BUN: 44 mg/dL — ABNORMAL HIGH (ref 8–23)
CHLORIDE: 108 mmol/L (ref 98–111)
CO2: 19 mmol/L — AB (ref 22–32)
Calcium: 7.4 mg/dL — ABNORMAL LOW (ref 8.9–10.3)
Creatinine, Ser: 1.89 mg/dL — ABNORMAL HIGH (ref 0.44–1.00)
GFR calc Af Amer: 28 mL/min — ABNORMAL LOW (ref >60)
GFR calc non Af Amer: 24 mL/min — ABNORMAL LOW (ref >60)
Glucose, Bld: 98 mg/dL (ref 70–99)
Potassium: 3.4 mmol/L — ABNORMAL LOW (ref 3.5–5.1)
Sodium: 139 mmol/L (ref 135–145)
Total Bilirubin: 1 mg/dL (ref 0.3–1.2)
Total Protein: 5.5 g/dL — ABNORMAL LOW (ref 6.5–8.1)

## 2018-05-28 LAB — CK: Total CK: 7040 U/L — ABNORMAL HIGH (ref 38–234)

## 2018-05-28 LAB — HEMOGLOBIN A1C
Hgb A1c MFr Bld: 6.3 % — ABNORMAL HIGH (ref 4.8–5.6)
Mean Plasma Glucose: 134.11 mg/dL

## 2018-05-28 LAB — AMMONIA: AMMONIA: 30 umol/L (ref 9–35)

## 2018-05-28 MED ORDER — POTASSIUM CHLORIDE CRYS ER 20 MEQ PO TBCR
40.0000 meq | EXTENDED_RELEASE_TABLET | Freq: Once | ORAL | Status: AC
  Administered 2018-05-28: 40 meq via ORAL
  Filled 2018-05-28: qty 2

## 2018-05-28 MED ORDER — IPRATROPIUM-ALBUTEROL 0.5-2.5 (3) MG/3ML IN SOLN
3.0000 mL | RESPIRATORY_TRACT | Status: DC | PRN
  Administered 2018-05-28 – 2018-05-30 (×4): 3 mL via RESPIRATORY_TRACT
  Filled 2018-05-28 (×4): qty 3

## 2018-05-28 MED ORDER — GUAIFENESIN ER 600 MG PO TB12
600.0000 mg | ORAL_TABLET | Freq: Two times a day (BID) | ORAL | Status: DC
  Administered 2018-05-28 – 2018-05-31 (×7): 600 mg via ORAL
  Filled 2018-05-28 (×7): qty 1

## 2018-05-28 MED ORDER — SODIUM CHLORIDE 0.9 % IV SOLN
INTRAVENOUS | Status: DC
  Administered 2018-05-28: 1000 mL via INTRAVENOUS

## 2018-05-28 NOTE — Progress Notes (Signed)
On assessment patient with lung sounds clear diminished, but congested in upper chest. MD made aware. Will continue to monitor.

## 2018-05-28 NOTE — Progress Notes (Signed)
PROGRESS NOTE        PATIENT DETAILS Name: [name] Age: 85 y.o. Sex: female Date of Birth: Dec 27, 1933 Admit Date: 05/26/2018 Admitting Physician Lenore Cordia, MD ZOX:[id number], [name], FNP  Brief Narrative: Patient is a 85 year old female with prior history of CVA, pacemaker in place, hypertension who sustained a mechanical fall and was on the floor for more than 24 hours before being found by her family.  She was brought to the ED, found to have rhabdomyolysis, acute renal failure and left-sided weakness.  She was also found to have gram-negative bacteremia.  See below for further details  Subjective: Still febrile last evening-but overall better.  Is awake and alert.  Denies any chest pain or shortness of breath.  Assessment/Plan: AKI: Secondary to rhabdomyolysis and hypotension, improved with IV fluids.  Continue to avoid nephrotoxic agents.    Rhabdomyolysis:  Improving-secondary to being down on the floor for more than 24 hours.  Continue gentle hydration-trend CK.   Klebsiella pneumoniae bacteremia:  Continues to be febrile but overall improved-likely from a urinary source.  RUQ exam is completely benign.  LFTs are elevated but this is probably from rhabdomyolysis.  Continue IV Rocephin-hopefully patient will defervesce soon-await final culture results.   Transaminitis: Likely secondary to rhabdomyolysis-downtrending-continue to trend LFTs.  .  Mild left-sided weakness: Seems to have resolved-concern for small CVA-however neurology seems to think that mild left-sided weakness on admission was probably secondary to recrudescence of the old stroke in the setting of rhabdomyolysis, AKI.  Continue dual antiplatelet agents-resume statin when LFTs/CK have improved further.  LDL 40, A1c 6.3.  Carotid Doppler without any major stenosis.  Await TTE.  Minimally elevated troponin: Trend is flat-she has no anginal symptoms.  Doubt ACS-could be elevated  due to AKI/rhabdomyolysis.  EKG unchanged.  Obtain echocardiogram-but doubt further work-up is required.  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  SNF on discharge.  .    Hypertension:  Blood pressure improved-no need for any antihypertensives at this point  Permanent pacemaker in place-given history of complete heart block.  DVT Prophylaxis: Prophylactic Heparin   Code Status: Full code   Family Communication: Granddaughter at bedside  Disposition Plan: Remain inpatient-SNF on discharge  Antimicrobial agents: Anti-infectives (From admission, onward)   Start     Dose/Rate Route Frequency Ordered Stop   05/27/18 0800  cefTRIAXone (ROCEPHIN) 2 g in sodium chloride 0.9 % 100 mL IVPB     2 g 200 mL/hr over 30 Minutes Intravenous Every 24 hours 05/27/18 0724     05/26/18 2030  vancomycin (VANCOCIN) 2,000 mg in sodium chloride 0.9 % 500 mL IVPB  Status:  Discontinued     2,000 mg 250 mL/hr over 120 Minutes Intravenous  Once 05/26/18 2017 05/26/18 2144   05/26/18 2016  vancomycin variable dose per unstable renal function (pharmacist dosing)  Status:  Discontinued      Does not apply See admin instructions 05/26/18 2017 05/26/18 2144   05/26/18 1930  piperacillin-tazobactam (ZOSYN) IVPB 3.375 g     3.375 g 100 mL/hr over 30 Minutes Intravenous  Once 05/26/18 1924 05/26/18 2013      Procedures: None  CONSULTS: Neurology  Time spent: 25 minutes-Greater than 50% of this time was spent in counseling, explanation of diagnosis, planning of further management, and coordination of  care.  MEDICATIONS: Scheduled Meds: . aspirin  81 mg Oral Daily  . chlorhexidine  15 mL Mouth Rinse BID  . clopidogrel  75 mg Oral Q breakfast  . heparin  5,000 Units Subcutaneous Q8H  . mouth rinse  15 mL Mouth Rinse q12n4p   Continuous Infusions: . sodium chloride 1,000 mL (05/28/18 0740)  . cefTRIAXone (ROCEPHIN)  IV 2 g  (05/28/18 0740)   PRN Meds:.acetaminophen   PHYSICAL EXAM: Vital signs: Vitals:   05/28/18 0542 05/28/18 0642 05/28/18 0737 05/28/18 1238  BP:   106/66 113/68  Pulse:   100 80  Resp:   (!) 26 (!) 26  Temp: (!) 101.5 F (38.6 C) 99.3 F (37.4 C) 98.9 F (37.2 C) 97.8 F (36.6 C)  TempSrc: Oral Oral Oral Oral  SpO2:   91% 99%  Weight:      Height:       Filed Weights   05/26/18 2000 05/27/18 0538  Weight: 93.4 kg 95 kg   Body mass index is 37.1 kg/m.   General appearance :Awake, alert, not in any distress.  HEENT: Atraumatic and Normocephalic Neck: supple Resp:Good air entry bilaterally, no added sounds  CVS: S1 S2 regular, no murmurs.  GI: Bowel sounds present, Non tender and not distended with no gaurding, rigidity or rebound.No organomegaly Extremities: B/L Lower Ext shows no edema, both legs are warm to touch Neurology: Nonfocal Psychiatric: Normal judgment and insight. Alert and oriented x 3. Normal mood. Musculoskeletal:No digital cyanosis Skin:No Rash, warm and dry Wounds:N/A  I have personally reviewed following labs and imaging studies  LABORATORY DATA: CBC: Recent Labs  Lab 05/26/18 1740 05/27/18 0037  WBC 7.3 8.0  NEUTROABS 6.2  --   HGB 14.8 12.5  HCT 48.3* 40.6  MCV 86.1 85.5  PLT 159 121*    Basic Metabolic Panel: Recent Labs  Lab 05/26/18 1740 05/27/18 0037 05/28/18 0353  NA 141 140 139  K 3.6 3.4* 3.4*  CL 101 105 108  CO2 19* 21* 19*  GLUCOSE 157* 137* 98  BUN 50* 50* 44*  CREATININE 2.27* 2.10* 1.89*  CALCIUM 8.6* 7.3* 7.4*    GFR: Estimated Creatinine Clearance: 25.1 mL/min (A) (by C-G formula based on SCr of 1.89 mg/dL (H)).  Liver Function Tests: Recent Labs  Lab 05/26/18 1740 05/27/18 0037 05/28/18 0353  AST 446* 366* 237*  ALT 170* 148* 143*  ALKPHOS 52 41 40  BILITOT 2.4* 2.0* 1.0  PROT 7.0 6.5 5.5*  ALBUMIN 3.0* 2.5* 2.2*   No results for input(s): LIPASE, AMYLASE in the last 168 hours. Recent Labs  Lab  05/28/18 0353  AMMONIA 30    Coagulation Profile: Recent Labs  Lab 05/26/18 1740  INR 1.27    Cardiac Enzymes: Recent Labs  Lab 05/26/18 0035 05/26/18 1740 05/27/18 0037 05/27/18 0755 05/28/18 0353  CKTOTAL  --  23,612* 18,280*  --  7,040*  TROPONINI 1.08*  --   --  0.70*  --     BNP (last 3 results) No results for input(s): PROBNP in the last 8760 hours.  HbA1C: Recent Labs    05/28/18 0353  HGBA1C 6.3*    CBG: Recent Labs  Lab 05/26/18 1910  GLUCAP 132*    Lipid Profile: Recent Labs    05/28/18 0353  CHOL 79  HDL 12*  LDLCALC 40  TRIG 136  CHOLHDL 6.6    Thyroid Function Tests: No results for input(s): TSH, T4TOTAL, FREET4, T3FREE, THYROIDAB in the last 72 hours.  Anemia Panel: No results for input(s): VITAMINB12, FOLATE, FERRITIN, TIBC, IRON, RETICCTPCT in the last 72 hours.  Urine analysis:    Component Value Date/Time   COLORURINE AMBER (A) 05/26/2018 2130   APPEARANCEUR HAZY (A) 05/26/2018 2130   LABSPEC 1.017 05/26/2018 2130   PHURINE 5.0 05/26/2018 2130   GLUCOSEU NEGATIVE 05/26/2018 2130   HGBUR LARGE (A) 05/26/2018 2130   BILIRUBINUR NEGATIVE 05/26/2018 2130   KETONESUR 20 (A) 05/26/2018 2130   PROTEINUR 100 (A) 05/26/2018 2130   NITRITE NEGATIVE 05/26/2018 2130   LEUKOCYTESUR TRACE (A) 05/26/2018 2130    Sepsis Labs: Lactic Acid, Venous No results found for: LATICACIDVEN  MICROBIOLOGY: Recent Results (from the past 240 hour(s))  Blood culture (routine x 2)     Status: Abnormal (Preliminary result)   Collection Time: 05/26/18  6:42 PM  Result Value Ref Range Status   Specimen Description BLOOD SITE NOT SPECIFIED  Final   Special Requests   Final    BOTTLES DRAWN AEROBIC AND ANAEROBIC Blood Culture results may not be optimal due to an inadequate volume of blood received in culture bottles   Culture  Setup Time   Final    GRAM NEGATIVE RODS IN BOTH AEROBIC AND ANAEROBIC BOTTLES CRITICAL RESULT CALLED TO, READ BACK BY AND  VERIFIED WITH: PHAN PHARMD AT 0902 ON [id number] BY SJW    Culture (A)  Final    KLEBSIELLA PNEUMONIAE SUSCEPTIBILITIES TO FOLLOW Performed at [facility], [place] [address], [place], [place] [address]    Report Status PENDING  Incomplete  Blood culture (routine x 2)     Status: Abnormal (Preliminary result)   Collection Time: 05/26/18  6:42 PM  Result Value Ref Range Status   Specimen Description BLOOD BLOOD RIGHT FOREARM  Final   Special Requests   Final    BOTTLES DRAWN AEROBIC AND ANAEROBIC Blood Culture results may not be optimal due to an inadequate volume of blood received in culture bottles   Culture  Setup Time   Final    GRAM NEGATIVE RODS IN BOTH AEROBIC AND ANAEROBIC BOTTLES Performed at [facility], [place] [address] [place], [place] [address]    Culture KLEBSIELLA PNEUMONIAE (A)  Final   Report Status PENDING  Incomplete  Blood Culture ID Panel (Reflexed)     Status: Abnormal   Collection Time: 05/26/18  6:42 PM  Result Value Ref Range Status   Enterococcus species NOT DETECTED NOT DETECTED Final   Listeria monocytogenes NOT DETECTED NOT DETECTED Final   Staphylococcus species NOT DETECTED NOT DETECTED Final   Staphylococcus aureus (BCID) NOT DETECTED NOT DETECTED Final   Streptococcus species NOT DETECTED NOT DETECTED Final   Streptococcus agalactiae NOT DETECTED NOT DETECTED Final   Streptococcus pneumoniae NOT DETECTED NOT DETECTED Final   Streptococcus pyogenes NOT DETECTED NOT DETECTED Final   Acinetobacter baumannii NOT DETECTED NOT DETECTED Final   Enterobacteriaceae species DETECTED (A) NOT DETECTED Final    Comment: Enterobacteriaceae represent a large family of gram-negative bacteria, not a single organism. CRITICAL RESULT CALLED TO, READ BACK BY AND VERIFIED WITH: PHAN PHARMD AT 0902 ON [id number] BY SJW    Enterobacter cloacae complex NOT DETECTED NOT DETECTED Final   Escherichia coli NOT DETECTED NOT DETECTED Final   Klebsiella oxytoca NOT DETECTED  NOT DETECTED Final   Klebsiella pneumoniae DETECTED (A) NOT DETECTED Final    Comment: CRITICAL RESULT CALLED TO, READ BACK BY AND VERIFIED WITH: PHAN PHARMD AT 0902 ON [id number] BY SJW  Proteus species NOT DETECTED NOT DETECTED Final   Serratia marcescens NOT DETECTED NOT DETECTED Final   Carbapenem resistance NOT DETECTED NOT DETECTED Final   Haemophilus influenzae NOT DETECTED NOT DETECTED Final   Neisseria meningitidis NOT DETECTED NOT DETECTED Final   Pseudomonas aeruginosa NOT DETECTED NOT DETECTED Final   Candida albicans NOT DETECTED NOT DETECTED Final   Candida glabrata NOT DETECTED NOT DETECTED Final   Candida krusei NOT DETECTED NOT DETECTED Final   Candida parapsilosis NOT DETECTED NOT DETECTED Final   Candida tropicalis NOT DETECTED NOT DETECTED Final    Comment: Performed at [facility], [place] [address] [place], [place] [address]  MRSA PCR Screening     Status: None   Collection Time: 05/26/18 11:48 PM  Result Value Ref Range Status   MRSA by PCR NEGATIVE NEGATIVE Final    Comment:        The GeneXpert MRSA Assay (FDA approved for NASAL specimens only), is one component of a comprehensive MRSA colonization surveillance program. It is not intended to diagnose MRSA infection nor to guide or monitor treatment for MRSA infections. Performed at [facility], [place] [address] [place], [place] [address]     RADIOLOGY STUDIES/RESULTS: Ct Head Wo Contrast  Result Date: 05/27/2018 CLINICAL DATA:  Follow-up examination for acute stroke. EXAM: CT HEAD WITHOUT CONTRAST TECHNIQUE: Contiguous axial images were obtained from the base of the skull through the vertex without intravenous contrast. COMPARISON:  Prior CT from 05/26/2018 FINDINGS: Brain: Atrophy with chronic microvascular ischemic disease with scattered remote lacunar infarcts involving the bilateral basal ganglia and thalami, stable. Chronic left cerebellar infarct noted. No acute or evolving large vessel  territory infarct. No intracranial hemorrhage. No mass lesion, midline shift or mass effect. No hydrocephalus. No extra-axial fluid collection. Vascular: No hyperdense vessel. Scattered vascular calcifications noted within the carotid siphons. Skull: Scalp soft tissues and calvarium within normal limits. Sinuses/Orbits: Globes and orbital soft tissues demonstrate no acute finding. Scattered chronic mucosal thickening within the ethmoidal air cells and maxillary sinuses. Mastoid air cells are clear. Other: None. IMPRESSION: 1. Stable head CT.  No acute intracranial abnormality identified. 2. Atrophy with moderate chronic small vessel ischemic disease with chronic lacunar and left cerebellar infarcts as above. Electronically Signed   By: Jeannine Boga M.D.   On: 05/27/2018 17:40   Ct Head Wo Contrast  Result Date: 05/26/2018 CLINICAL DATA:  Unwitnessed fall yesterday. EXAM: CT HEAD WITHOUT CONTRAST TECHNIQUE: Contiguous axial images were obtained from the base of the skull through the vertex without intravenous contrast. COMPARISON:  None. FINDINGS: Brain: No evidence of acute infarction, hemorrhage, hydrocephalus, extra-axial collection or mass lesion/mass effect. Unchanged chronic infarcts in the left cerebellum, right basal ganglia, and left thalamus. Stable mild atrophy and chronic microvascular ischemic changes. Vascular: Atherosclerotic vascular calcification of the carotid siphons. No hyperdense vessel. Skull: Negative for fracture or focal lesion. Sinuses/Orbits: Mild paranasal sinus mucosal thickening. No air-fluid levels. The mastoid air cells are clear. The orbits are unremarkable. Other: None. IMPRESSION: 1.  No acute intracranial abnormality. 2. Stable chronic small infarcts, microvascular ischemic changes, and mild atrophy. Electronically Signed   By: Titus Dubin M.D.   On: 05/26/2018 17:42   Dg Chest Portable 1 View  Result Date: 05/26/2018 CLINICAL DATA:  Fever. EXAM: PORTABLE CHEST  1 VIEW COMPARISON:  Chest x-ray dated June 11, 2016. FINDINGS: The patient is rotated to the right, limiting evaluation. Unchanged left chest wall pacemaker. Grossly unchanged cardiomediastinal silhouette. Normal  pulmonary vascularity. No focal consolidation, pleural effusion, or pneumothorax. No acute osseous abnormality. IMPRESSION: Limited study due to rotation.  No active disease. Electronically Signed   By: Titus Dubin M.D.   On: 05/26/2018 17:35   Dg Shoulder Right Portable  Result Date: 05/26/2018 CLINICAL DATA:  Fall. EXAM: PORTABLE RIGHT SHOULDER COMPARISON:  Right shoulder x-rays dated April 04, 2010. FINDINGS: No acute fracture or dislocation. Mild glenohumeral joint space narrowing with small marginal osteophytes, slightly progressed since 2011. The acromioclavicular joint space is preserved. Small marginal acromion osteophytes. Soft tissues are unremarkable. IMPRESSION: 1.  No acute osseous abnormality. 2. Mild glenohumeral osteoarthritis, slightly progressed since 2011. Electronically Signed   By: Titus Dubin M.D.   On: 05/26/2018 17:37   Vas US Carotid  Result Date: 05/28/2018 Carotid Arterial Duplex Study Indications:  CVA. Risk Factors: Hypertension, hyperlipidemia. Limitations:  tortuosity and patient postition Performing Technologist: [name] RVS  Examination Guidelines: A complete evaluation includes B-mode imaging, spectral Doppler, color Doppler, and power Doppler as needed of all accessible portions of each vessel. Bilateral testing is considered an integral part of a complete examination. Limited examinations for reoccurring indications may be performed as noted.  Right Carotid Findings: +----------+--------+--------+--------+--------+--------------+           PSV cm/sEDV cm/sStenosisDescribeComments       +----------+--------+--------+--------+--------+--------------+ CCA Prox  97      23                                      +----------+--------+--------+--------+--------+--------------+ CCA Distal55      13                                     +----------+--------+--------+--------+--------+--------------+ ICA Prox  99      27      1-39%           tortuous       +----------+--------+--------+--------+--------+--------------+ ICA Distal                                Not visualized +----------+--------+--------+--------+--------+--------------+ ECA       46      9                                      +----------+--------+--------+--------+--------+--------------+ +----------+--------+-------+--------+-------------------+           PSV cm/sEDV cmsDescribeArm Pressure (mmHG) +----------+--------+-------+--------+-------------------+ [id number]                                         +----------+--------+-------+--------+-------------------+ +---------+--------+--+--------+--+---------+ VertebralPSV cm/s31EDV cm/s10Antegrade +---------+--------+--+--------+--+---------+  Left Carotid Findings: +----------+--------+--------+--------+--------+--------------+           PSV cm/sEDV cm/sStenosisDescribeComments       +----------+--------+--------+--------+--------+--------------+ CCA Prox  106     16                                     +----------+--------+--------+--------+--------+--------------+ CCA Distal40      10                                     +----------+--------+--------+--------+--------+--------------+  ICA Prox  73      29      1-39%           tortuous       +----------+--------+--------+--------+--------+--------------+ ICA Distal                                Not visualized +----------+--------+--------+--------+--------+--------------+ ECA       52                                             +----------+--------+--------+--------+--------+--------------+ +----------+--------+--------+--------+-------------------+ SubclavianPSV cm/sEDV  cm/sDescribeArm Pressure (mmHG) +----------+--------+--------+--------+-------------------+           63                                          +----------+--------+--------+--------+-------------------+ +---------+--------+--+--------+--+---------+ VertebralPSV cm/s39EDV cm/s15Antegrade +---------+--------+--+--------+--+---------+  Summary: Right Carotid: Velocities in the right ICA are consistent with a 1-39% stenosis. Left Carotid: Velocities in the left ICA are consistent with a 1-39% stenosis. Vertebrals: Bilateral vertebral arteries demonstrate antegrade flow. *See table(s) above for measurements and observations.  Electronically signed by Antony Contras MD on 05/28/2018 at 8:11:36 AM.    Final      LOS: 2 days   Oren Binet, MD  [facility]  If 7PM-7AM, please contact night-coverage  Please page via [email or web address]-Password [email or web address]-click on MD name and type text message  05/28/2018, 1:41 PM

## 2018-05-28 NOTE — Progress Notes (Addendum)
STROKE TEAM PROGRESS NOTE   INTERVAL HISTORY Patient up in the chair, finished eating lunch. States she feels better today than yesterday. Feels she did better with movement. Agreeable to need for therapy at a facility prior to return home. Repeat CT neg for new stroke. Likely recrudescence of previous stroke sx in setting of medical illness.   Vitals:   05/28/18 0428 05/28/18 0542 05/28/18 0642 05/28/18 0737  BP: 109/63   106/66  Pulse: 92   100  Resp: (!) 25   (!) 26  Temp:  (!) 101.5 F (38.6 C) 99.3 F (37.4 C) 98.9 F (37.2 C)  TempSrc:  Oral Oral Oral  SpO2: (!) 87%   91%  Weight:      Height:        CBC:  Recent Labs  Lab 05/26/18 1740 05/27/18 0037  WBC 7.3 8.0  NEUTROABS 6.2  --   HGB 14.8 12.5  HCT 48.3* 40.6  MCV 86.1 85.5  PLT 159 121*    Basic Metabolic Panel:  Recent Labs  Lab 05/27/18 0037 05/28/18 0353  NA 140 139  K 3.4* 3.4*  CL 105 108  CO2 21* 19*  GLUCOSE 137* 98  BUN 50* 44*  CREATININE 2.10* 1.89*  CALCIUM 7.3* 7.4*   Lipid Panel:     Component Value Date/Time   CHOL 79 05/28/2018 0353   TRIG 136 05/28/2018 0353   HDL 12 (L) 05/28/2018 0353   CHOLHDL 6.6 05/28/2018 0353   VLDL 27 05/28/2018 0353   LDLCALC 40 05/28/2018 0353   HgbA1c:  Lab Results  Component Value Date   HGBA1C 6.3 (H) 05/28/2018   Urine Drug Screen:     Component Value Date/Time   LABOPIA NONE DETECTED 05/26/2018 2130   COCAINSCRNUR NONE DETECTED 05/26/2018 2130   LABBENZ NONE DETECTED 05/26/2018 2130   AMPHETMU NONE DETECTED 05/26/2018 2130   THCU NONE DETECTED 05/26/2018 2130   LABBARB NONE DETECTED 05/26/2018 2130    Alcohol Level     Component Value Date/Time   ETH <10 05/27/2018 0513    IMAGING Ct Head Wo Contrast  Result Date: 05/27/2018 CLINICAL DATA:  Follow-up examination for acute stroke. EXAM: CT HEAD WITHOUT CONTRAST TECHNIQUE: Contiguous axial images were obtained from the base of the skull through the vertex without intravenous  contrast. COMPARISON:  Prior CT from 05/26/2018 FINDINGS: Brain: Atrophy with chronic microvascular ischemic disease with scattered remote lacunar infarcts involving the bilateral basal ganglia and thalami, stable. Chronic left cerebellar infarct noted. No acute or evolving large vessel territory infarct. No intracranial hemorrhage. No mass lesion, midline shift or mass effect. No hydrocephalus. No extra-axial fluid collection. Vascular: No hyperdense vessel. Scattered vascular calcifications noted within the carotid siphons. Skull: Scalp soft tissues and calvarium within normal limits. Sinuses/Orbits: Globes and orbital soft tissues demonstrate no acute finding. Scattered chronic mucosal thickening within the ethmoidal air cells and maxillary sinuses. Mastoid air cells are clear. Other: None. IMPRESSION: 1. Stable head CT.  No acute intracranial abnormality identified. 2. Atrophy with moderate chronic small vessel ischemic disease with chronic lacunar and left cerebellar infarcts as above. Electronically Signed   By: Jeannine Boga M.D.   On: 05/27/2018 17:40   Ct Head Wo Contrast  Result Date: 05/26/2018 CLINICAL DATA:  Unwitnessed fall yesterday. EXAM: CT HEAD WITHOUT CONTRAST TECHNIQUE: Contiguous axial images were obtained from the base of the skull through the vertex without intravenous contrast. COMPARISON:  None. FINDINGS: Brain: No evidence of acute infarction, hemorrhage, hydrocephalus, extra-axial  collection or mass lesion/mass effect. Unchanged chronic infarcts in the left cerebellum, right basal ganglia, and left thalamus. Stable mild atrophy and chronic microvascular ischemic changes. Vascular: Atherosclerotic vascular calcification of the carotid siphons. No hyperdense vessel. Skull: Negative for fracture or focal lesion. Sinuses/Orbits: Mild paranasal sinus mucosal thickening. No air-fluid levels. The mastoid air cells are clear. The orbits are unremarkable. Other: None. IMPRESSION: 1.  No  acute intracranial abnormality. 2. Stable chronic small infarcts, microvascular ischemic changes, and mild atrophy. Electronically Signed   By: Titus Dubin M.D.   On: 05/26/2018 17:42   Dg Chest Portable 1 View  Result Date: 05/26/2018 CLINICAL DATA:  Fever. EXAM: PORTABLE CHEST 1 VIEW COMPARISON:  Chest x-ray dated June 11, 2016. FINDINGS: The patient is rotated to the right, limiting evaluation. Unchanged left chest wall pacemaker. Grossly unchanged cardiomediastinal silhouette. Normal pulmonary vascularity. No focal consolidation, pleural effusion, or pneumothorax. No acute osseous abnormality. IMPRESSION: Limited study due to rotation.  No active disease. Electronically Signed   By: Titus Dubin M.D.   On: 05/26/2018 17:35   Dg Shoulder Right Portable  Result Date: 05/26/2018 CLINICAL DATA:  Fall. EXAM: PORTABLE RIGHT SHOULDER COMPARISON:  Right shoulder x-rays dated April 04, 2010. FINDINGS: No acute fracture or dislocation. Mild glenohumeral joint space narrowing with small marginal osteophytes, slightly progressed since 2011. The acromioclavicular joint space is preserved. Small marginal acromion osteophytes. Soft tissues are unremarkable. IMPRESSION: 1.  No acute osseous abnormality. 2. Mild glenohumeral osteoarthritis, slightly progressed since 2011. Electronically Signed   By: Titus Dubin M.D.   On: 05/26/2018 17:37   Vas US Carotid  Result Date: 05/28/2018 Carotid Arterial Duplex Study Indications:  CVA. Risk Factors: Hypertension, hyperlipidemia. Limitations:  tortuosity and patient postition Performing Technologist: [name] RVS  Examination Guidelines: A complete evaluation includes B-mode imaging, spectral Doppler, color Doppler, and power Doppler as needed of all accessible portions of each vessel. Bilateral testing is considered an integral part of a complete examination. Limited examinations for reoccurring indications may be performed as noted.  Right Carotid  Findings: +----------+--------+--------+--------+--------+--------------+           PSV cm/sEDV cm/sStenosisDescribeComments       +----------+--------+--------+--------+--------+--------------+ CCA Prox  97      23                                     +----------+--------+--------+--------+--------+--------------+ CCA Distal55      13                                     +----------+--------+--------+--------+--------+--------------+ ICA Prox  99      27      1-39%           tortuous       +----------+--------+--------+--------+--------+--------------+ ICA Distal                                Not visualized +----------+--------+--------+--------+--------+--------------+ ECA       46      9                                      +----------+--------+--------+--------+--------+--------------+ +----------+--------+-------+--------+-------------------+  PSV cm/sEDV cmsDescribeArm Pressure (mmHG) +----------+--------+-------+--------+-------------------+ [id number]                                         +----------+--------+-------+--------+-------------------+ +---------+--------+--+--------+--+---------+ VertebralPSV cm/s31EDV cm/s10Antegrade +---------+--------+--+--------+--+---------+  Left Carotid Findings: +----------+--------+--------+--------+--------+--------------+           PSV cm/sEDV cm/sStenosisDescribeComments       +----------+--------+--------+--------+--------+--------------+ CCA Prox  106     16                                     +----------+--------+--------+--------+--------+--------------+ CCA Distal40      10                                     +----------+--------+--------+--------+--------+--------------+ ICA Prox  73      29      1-39%           tortuous       +----------+--------+--------+--------+--------+--------------+ ICA Distal                                Not visualized  +----------+--------+--------+--------+--------+--------------+ ECA       52                                             +----------+--------+--------+--------+--------+--------------+ +----------+--------+--------+--------+-------------------+ SubclavianPSV cm/sEDV cm/sDescribeArm Pressure (mmHG) +----------+--------+--------+--------+-------------------+           63                                          +----------+--------+--------+--------+-------------------+ +---------+--------+--+--------+--+---------+ VertebralPSV cm/s39EDV cm/s15Antegrade +---------+--------+--+--------+--+---------+  Summary: Right Carotid: Velocities in the right ICA are consistent with a 1-39% stenosis. Left Carotid: Velocities in the left ICA are consistent with a 1-39% stenosis. Vertebrals: Bilateral vertebral arteries demonstrate antegrade flow. *See table(s) above for measurements and observations.  Electronically signed by Antony Contras MD on 05/28/2018 at 8:11:36 AM.    Final     PHYSICAL EXAM General:  obese, sitting up in chair Psych: Affect appropriate to situation, pleasant Eyes: No scleral injection HENT: No OP obstrucion Head: Normocephalic.  Cardiovascular: Normal rate and regular rhythm.  Respiratory: Effort normal  Skin: WDI  Neurological Examination Mental Status: Alert, oriented to herself and place.  Speech fluent without evidence of aphasia.  No obvious dysarthria noted.  Able to follow 3 step commands without difficulty. Cranial Nerves: II: Visual fields grossly normal,  III,IV, VI: ptosis not present, extra-ocular motions intact bilaterally, pupils equal, round, reactive to light and accommodation V,VII: smile symmetric, facial light touch sensation normal bilaterally VIII: hearing normal bilaterally IX,X: uvula rises symmetrically XI: bilateral shoulder shrug XII: midline tongue extension Motor: Right :  Upper extremity   5/5                                                 Left:  Upper extremity   5/5             Lower extremity   4/5                                                  Lower extremity   4/5 Tone and bulk:normal tone throughout; no atrophy noted Sensory: light touch and cool intact throughout, bilaterally Plantars: Right: downgoing                                Left: downgoing Cerebellar: normal finger-to-nose slightly impaired due to bilateral shoulder immobility Gait: deferred   ASSESSMENT/PLAN [name] is a 85 y.o. female with history of CHD s/p pacer, CVA, HTN, HLD presenting after being found down at home. In ED found to have slurred speech and LUE weakness, L facial droop along with rhabdomylosis. Neuro sx cleared by time of consult.   Recrudescence of previous stroke sx in setting of medical illness. No new stroke.  CT head No acute stroke. Small vessel disease. Atrophy.   Repeat CT no acute stroke. Atrophy. Small vessel disease.  Consider CTA head & neck when Cr improves  No MRI d/t pacer  Carotid Doppler  B ICA 1-39% stenosis, VAs antegrade   2D Echo  pending   Pacer - Medtronic - interrogation  No AF or irregularity  LDL 40  HgbA1c 6.3  Heparin 5000 units sq tid for VTE prophylaxis  aspirin 81 mg daily and clopidogrel 75 mg daily prior to admission, now on aspirin 81 mg daily and clopidogrel 75 mg daily. Continue DAPT.  Therapy recommendations:  SNF  Disposition:  pending   Followed by DR. 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 She will continue follow-up with Dr. Jannifer Franklin at [facility] after discharge. NOTHING FURTHER TO ADD FROM THE STROKE STANDPOINT Ongoing risk factor control by Primary Care Physician Stroke Service will sign off. Please call should any needs arise.  Hypotension, resolved Hx Hypertension  Home meds: HCTZ 75 , currently on hold . Permissive hypertension (OK if < 220/120) but gradually normalize in 5-7 days . Avoid hypotension . Long-term BP goal  normotensive  Hyperlipidemia  Home meds:  zocor 40  Statin on hold by IM d/t transaminitis  LDL 40, goal < 70 given hx stroke  Resume statin once transaminitis stable  Other Stroke Risk Factors  Advanced age  Former Cigarette smoker, quit 11 yrs ago  Obesity, Body mass index is 37.1 kg/m., recommend weight loss, diet and exercise as appropriate   Hx stroke/TIA - cortical, subcortical, thalamic, and brainstem infarcts with resultant gait d/o and hyperreflexia   CHB w/ Pacer - Medtronic - interrogation neg for AF or arrhythmia   Other Active Problems  Falls - hx gait d/o - Followed by DR. 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  [place] [address]->7040  AKI Cr 2.10->1.89  Elevated troponin 0.70  Transaminitis. D/t rhabdo. Statin on hold  Hypokalemia K 3.4  Bacteremia. Blood culture 3/ w/ kleb pneumo & enterobacteriaceae species. On ceftriaxone   Hospital day # 2  Burnetta Sabin, MSN, APRN, ANVP-BC, AGPCNP-BC Advanced Practice Stroke Nurse [place]ree for Schedule & Pager information 05/28/2018 9:03 AM   ATTENDING NOTE: I reviewed  above note and agree with the assessment and plan. Pt was seen and examined.   85 year old female with history of CHF, heart block status post pacemaker, hypertension, hyperlipidemia admitted for fall at home for a day with slurred speech and left-sided weakness and left facial droop.  CT no acute abnormality but chronic infarcts at left cerebellum, right caudate and left thalamus.  2D echo pending.  Not able to do MRI due to pacemaker or CTA given elevated creatinine.  Carotid Doppler unremarkable. LDL 40 and A1c 6.3.  CT repeat no acute finding.  Pacemaker interrogation showed no A. fib or a flutter.  She was also found to have dehydration, rhabdomyolysis, and AKI.  Treated with IV fluid.  She also was found to have fever with temperature one 101.3 and chills.  UA showed WBC 21-50, treated  with Rocephin. Pt developed high grade fever and Blood culture showed Klebsiella Pneumoniae. This am she still has fever and Tmax 101.5.  Patient episode of slurred speech and left-sided weakness likely due to recrudescence of previous chronic stroke in the setting of UTI and bacteremia, AKI, rhabdomyolysis and dehydration. No evidence of endocarditis at this time. Recommend continue home aspirin and Plavix and Zocor.  Continue IV fluid and antibiotics.   Patient has been following with Dr. Jannifer Franklin at [facility] for gait disorder, ruled out the myelopathy and considered etiology of chronic cerebrovascular disease.  She will continue follow-up with Dr. Jannifer Franklin at [facility] after discharge.  Neurology will sign off. Please call with questions. Pt will follow up with Dr. Jannifer Franklin at [facility] in about 4 weeks. Thanks for the consult.   Rosalin Hawking, MD PhD Stroke Neurology 05/28/2018 4:46 PM    To contact Stroke Continuity provider, please refer to [email or web address] After hours, contact General Neurology

## 2018-05-28 NOTE — Clinical Social Work Note (Addendum)
Clinical Social Work Assessment  Patient Details  Name: [name] MRN: [id number] Date of Birth: 1933-11-30  Date of referral:  05/28/18               Reason for consult:  Facility Placement                Permission sought to share information with:  Facility Sport and exercise psychologist, Family Supports Permission granted to share information::  Yes, Verbal Permission Granted  Name::        Agency::  SNFs  Relationship::     Contact Information:     Housing/Transportation Living arrangements for the past 2 months:  Apartment Source of Information:  Patient Patient Interpreter Needed:  None Criminal Activity/Legal Involvement Pertinent to Current Situation/Hospitalization:  No - Comment as needed Significant Relationships:  Adult Children Lives with:  Self Do you feel safe going back to the place where you live?  No Need for family participation in patient care:  No (Coment)  Care giving concerns:  CSW received consult for possible SNF placement at time of discharge. CSW spoke with patient regarding PT recommendation of SNF placement at time of discharge. Patient reported that she lives alone and is currently unable to care for herself at home given patient's current physical needs and fall risk. Patient expressed understanding of PT recommendation and is agreeable to SNF placement at time of discharge. CSW to continue to follow and assist with discharge planning needs.   Social Worker assessment / plan:  CSW spoke with patient concerning possibility of rehab at [facility] before returning home.  Employment status:  Retired Nurse, adult PT Recommendations:  [place] / Referral to community resources:  [place]  Patient/Family's Response to care:  Patient recognizes need for rehab before returning home and is agreeable to a SNF in [place]. Patient reported preference for [facility] or [place].  Patient/Family's Understanding of and Emotional Response to Diagnosis, Current Treatment, and Prognosis:  Patient/family is realistic regarding therapy needs and expressed being hopeful for SNF placement. She reported that she was very cold and had chills. The neurologist had also seen patient and is aware. Patient expressed understanding of CSW role and discharge process as well as medical condition. No questions/concerns about plan or treatment.    Emotional Assessment Appearance:  Appears stated age Attitude/Demeanor/Rapport:  Engaged Affect (typically observed):  Accepting, Appropriate, Pleasant Orientation:  Oriented to Self, Oriented to Place, Oriented to  Time, Oriented to Situation Alcohol / Substance use:  Not Applicable Psych involvement (Current and /or in the community):  No (Comment)  Discharge Needs  Concerns to be addressed:  Care Coordination Readmission within the last 30 days:  No Current discharge risk:  Dependent with Mobility Barriers to Discharge:  Continued Medical Work up   [employer], LCSW 05/28/2018, 9:59 AM

## 2018-05-28 NOTE — Progress Notes (Signed)
Occupational Therapy Treatment Patient Details Name: [name] MRN: [id number] DOB: 03/29/1934 Today's Date: 05/28/2018    History of present illness Pt is a 85 y.o. female admitted 05/26/18 after being found down at home from fall; per report, pt with slurred speech and L-side weakness. Head CT negative for acute abnormality; stable, chronic small infarcts. R shoulder xray negative for acute fx. Worked up for rhabdomyolysis and AKI. PMH includes pacemaker, HTN, CVA, CHF, HLD.   OT comments  Pt in bed upon arrival with lunch tray in front of her and she requested to sit up in chair to eat. OT assisted pt to EOB with min A for simulated UB bathing tasks. Pt stood for EOB mod A with RW and transferred to recliner with min A. OT will continue to follow acutely  Follow Up Recommendations  SNF    Equipment Recommendations  Other (comment)(TBD at next venue of care)    Recommendations for Other Services      Precautions / Restrictions Precautions Precautions: Fall Restrictions Weight Bearing Restrictions: No       Mobility Bed Mobility Overal bed mobility: Needs Assistance Bed Mobility: Supine to Sit     Supine to sit: Min assist;HOB elevated        Transfers Overall transfer level: Needs assistance Equipment used: Rolling walker (2 wheeled) Transfers: Sit to/from Stand Sit to Stand: Min assist;Mod assist              Balance                                           ADL either performed or assessed with clinical judgement   ADL   Eating/Feeding: Set up;Sitting   Grooming: Wash/dry hands;Wash/dry face;Sitting;Supervision/safety;Set up   Upper Body Bathing: Minimal assistance;Sitting Upper Body Bathing Details (indicate cue type and reason): simulated Lower Body Bathing: Moderate assistance;Sitting/lateral leans Lower Body Bathing Details (indicate cue type and reason): simulated Upper Body Dressing : Minimal assistance;Sitting       Toilet Transfer: Minimal assistance;Cueing for safety;RW;Ambulation   Toileting- Clothing Manipulation and Hygiene: Maximal assistance;Sit to/from stand       Functional mobility during ADLs: Minimal assistance;Cueing for safety;Rolling walker       Vision Baseline Vision/History: Wears glasses Wears Glasses: At all times Patient Visual Report: No change from baseline     Perception     Praxis      Cognition Arousal/Alertness: Awake/alert Behavior During Therapy: WFL for tasks assessed/performed Overall Cognitive Status: Impaired/Different from baseline Area of Impairment: Memory;Following commands;Safety/judgement;Awareness;Problem solving                     Memory: Decreased short-term memory Following Commands: Follows multi-step commands with increased time Safety/Judgement: Decreased awareness of deficits;Decreased awareness of safety              Exercises     Shoulder Instructions       General Comments      Pertinent Vitals/ Pain       Pain Assessment: 0-10 Pain Score: 3  Pain Location: R shoulder Pain Descriptors / Indicators: Sore;Guarding Pain Intervention(s): Monitored during session;Repositioned  Home Living  Prior Functioning/Environment              Frequency  Min 2X/week        Progress Toward Goals  OT Goals(current goals can now be found in the care plan section)  Progress towards OT goals: Progressing toward goals     Plan Discharge plan remains appropriate    Co-evaluation                 AM-PAC OT "6 Clicks" Daily Activity     Outcome Measure   Help from another person eating meals?: None Help from another person taking care of personal grooming?: A Little Help from another person toileting, which includes using toliet, bedpan, or urinal?: Total Help from another person bathing (including washing, rinsing, drying)?: A Lot Help from  another person to put on and taking off regular upper body clothing?: A Little Help from another person to put on and taking off regular lower body clothing?: Total 6 Click Score: 14    End of Session Equipment Utilized During Treatment: Gait belt;Rolling walker  OT Visit Diagnosis: Unsteadiness on feet (R26.81);Other abnormalities of gait and mobility (R26.89);History of falling (Z91.81);Muscle weakness (generalized) (M62.81);Pain;Other symptoms and signs involving cognitive function Pain - Right/Left: Right Pain - part of body: Shoulder   Activity Tolerance Patient tolerated treatment well   Patient Left in chair;with call bell/phone within reach;with chair alarm set   Nurse Communication          Time: 6222-9798 OT Time Calculation (min): 13 min  Charges: OT General Charges $OT Visit: 1 Visit OT Treatments $Self Care/Home Management : 8-22 mins  }   [name] 05/28/2018, 2:21 PM

## 2018-05-28 NOTE — NC FL2 (Signed)
[place] LEVEL OF CARE SCREENING TOOL     IDENTIFICATION  Patient Name: [name] Birthdate: [birth year] Sex: female Admission Date (Current Location): 05/26/2018  [facility] [place] Number:  Herbalist and Address:  [facility], [place] [address] [place], [place] [address]      Provider Number: [id number]  Attending Physician Name and Address:  Jonetta Osgood, MD  Relative Name and Phone Number:  [name], daughter, [phone]    Current Level of Care: Hospital Recommended Level of Care: [facility] Prior Approval Number:    Date Approved/Denied:   PASRR Number: [id number]  Discharge Plan: SNF    Current Diagnoses: Patient Active Problem List   Diagnosis Date Noted  . Rhabdomyolysis 05/26/2018  . History of CVA (cerebrovascular accident) 05/26/2018  . Essential hypertension 05/26/2018  . Hyperlipidemia 05/26/2018  . AKI (acute kidney injury) ([place]) 05/26/2018  . Transaminitis 05/26/2018  . Elevated troponin 05/26/2018  . Gait abnormality 07/23/2016  . Pacemaker-Medtronic 08/07/2011  . Atrial fibrillation ([place]) 08/07/2011  . Daytime somnolence 08/07/2011  . HYPERTENSION, HEART CONTROLLED W/O ASSOC CHF 06/07/2010  . AV BLOCK, COMPLETE 06/07/2010  . CVA 06/07/2010    Orientation RESPIRATION BLADDER Height & Weight     Self, Time, Situation, Place  Normal Incontinent, External catheter Weight: 95 kg Height:  5\' 3"  (160 cm)  BEHAVIORAL SYMPTOMS/MOOD NEUROLOGICAL BOWEL NUTRITION STATUS      Continent Diet(Please see DC Summary)  AMBULATORY STATUS COMMUNICATION OF NEEDS Skin   Limited Assist Verbally Normal                       Personal Care Assistance Level of Assistance  Bathing, Feeding, Dressing Bathing Assistance: Limited assistance Feeding assistance: Independent Dressing Assistance: Limited assistance     Functional Limitations Info  Hearing, Sight Sight Info:  Impaired Hearing Info: Impaired      SPECIAL CARE FACTORS FREQUENCY  PT (By licensed PT), OT (By licensed OT)     PT Frequency: 5x/week OT Frequency: 3x/week            Contractures Contractures Info: Not present    Additional Factors Info  Code Status, Allergies Code Status Info: Full Allergies Info: NKA           Current Medications (05/28/2018):  This is the current hospital active medication list Current Facility-Administered Medications  Medication Dose Route Frequency Provider Last Rate Last Dose  . 0.9 %  sodium chloride infusion   Intravenous Continuous Jonetta Osgood, MD 75 mL/hr at 05/28/18 0740 1,000 mL at 05/28/18 0740  . acetaminophen (TYLENOL) tablet 650 mg  650 mg Oral Q6H PRN Jonetta Osgood, MD   650 mg at 05/28/18 0550  . aspirin chewable tablet 81 mg  81 mg Oral Daily Rosalin Hawking, MD   81 mg at 05/28/18 0915  . cefTRIAXone (ROCEPHIN) 2 g in sodium chloride 0.9 % 100 mL IVPB  2 g Intravenous Q24H Jonetta Osgood, MD 200 mL/hr at 05/28/18 0740 2 g at 05/28/18 0740  . chlorhexidine (PERIDEX) 0.12 % solution 15 mL  15 mL Mouth Rinse BID Lenore Cordia, MD   15 mL at 05/28/18 0915  . clopidogrel (PLAVIX) tablet 75 mg  75 mg Oral Q breakfast Rosalin Hawking, MD   75 mg at 05/28/18 0915  . heparin injection 5,000 Units  5,000 Units Subcutaneous Q8H Lenore Cordia, MD   5,000 Units at 05/28/18 0526  .  MEDLINE mouth rinse  15 mL Mouth Rinse q12n4p Lenore Cordia, MD   15 mL at 05/27/18 1550     Discharge Medications: Please see discharge summary for a list of discharge medications.  Relevant Imaging Results:  Relevant Lab Results:   Additional Information SSN: [id number]  [place], [employer]

## 2018-05-29 ENCOUNTER — Inpatient Hospital Stay (HOSPITAL_COMMUNITY): Payer: Medicare Other

## 2018-05-29 DIAGNOSIS — I6389 Other cerebral infarction: Secondary | ICD-10-CM

## 2018-05-29 LAB — COMPREHENSIVE METABOLIC PANEL
ALK PHOS: 45 U/L (ref 38–126)
ALT: 124 U/L — AB (ref 0–44)
AST: 139 U/L — ABNORMAL HIGH (ref 15–41)
Albumin: 2.1 g/dL — ABNORMAL LOW (ref 3.5–5.0)
Anion gap: 10 (ref 5–15)
BUN: 36 mg/dL — ABNORMAL HIGH (ref 8–23)
CO2: 21 mmol/L — ABNORMAL LOW (ref 22–32)
CREATININE: 1.61 mg/dL — AB (ref 0.44–1.00)
Calcium: 7.8 mg/dL — ABNORMAL LOW (ref 8.9–10.3)
Chloride: 107 mmol/L (ref 98–111)
GFR calc Af Amer: 34 mL/min — ABNORMAL LOW (ref >60)
GFR calc non Af Amer: 29 mL/min — ABNORMAL LOW (ref >60)
Glucose, Bld: 162 mg/dL — ABNORMAL HIGH (ref 70–99)
Potassium: 3.2 mmol/L — ABNORMAL LOW (ref 3.5–5.1)
Sodium: 138 mmol/L (ref 135–145)
Total Bilirubin: 0.8 mg/dL (ref 0.3–1.2)
Total Protein: 5.7 g/dL — ABNORMAL LOW (ref 6.5–8.1)

## 2018-05-29 LAB — CULTURE, BLOOD (ROUTINE X 2)

## 2018-05-29 LAB — ECHOCARDIOGRAM COMPLETE
Height: 63 in
Weight: 3164.04 oz

## 2018-05-29 LAB — CK: Total CK: 2804 U/L — ABNORMAL HIGH (ref 38–234)

## 2018-05-29 MED ORDER — POTASSIUM CHLORIDE CRYS ER 20 MEQ PO TBCR
40.0000 meq | EXTENDED_RELEASE_TABLET | Freq: Once | ORAL | Status: AC
  Administered 2018-05-29: 40 meq via ORAL
  Filled 2018-05-29: qty 2

## 2018-05-29 MED ORDER — CEPHALEXIN 500 MG PO CAPS
500.0000 mg | ORAL_CAPSULE | Freq: Three times a day (TID) | ORAL | Status: DC
  Administered 2018-05-29 – 2018-05-31 (×6): 500 mg via ORAL
  Filled 2018-05-29 (×6): qty 1

## 2018-05-29 MED ORDER — PERFLUTREN LIPID MICROSPHERE
1.0000 mL | INTRAVENOUS | Status: AC | PRN
  Administered 2018-05-29: 2 mL via INTRAVENOUS
  Filled 2018-05-29: qty 10

## 2018-05-29 NOTE — Progress Notes (Signed)
Echocardiogram 2D Echocardiogram has been performed.  05/29/2018 3:57 PM Maudry Mayhew, MHA, RVT, RDCS, RDMS

## 2018-05-29 NOTE — Progress Notes (Signed)
PROGRESS NOTE        PATIENT DETAILS Name: [name] Age: 85 y.o. Sex: female Date of Birth: 01-11-1934 Admit Date: 05/26/2018 Admitting Physician Lenore Cordia, MD IBB:[id number], [name], FNP  Brief Narrative: Patient is a 85 year old female with prior history of CVA, pacemaker in place, hypertension who sustained a mechanical fall and was on the floor for more than 24 hours before being found by her family.  She was brought to the ED, found to have rhabdomyolysis, acute renal failure and left-sided weakness.  She was also found to have gram-negative bacteremia.  See below for further details  Subjective: Afebrile-denies any chest pain or shortness of breath.  Assessment/Plan: AKI: Secondary to rhabdomyolysis and hypotension, creatinine continues to improve with just supportive care.  Appears euvolemic-stop all IV fluids and follow electrolytes.  Continue to avoid nephrotoxic agents.  Rhabdomyolysis:  Much improved-CK has trended down to 2804 with supportive-follow periodically.   Klebsiella pneumoniae bacteremia: Afebrile overnight-seems to have defervesced-suspect bacteremia was probably from a urinary source.  Although her LFTs are elevated-her RUQ exam is completely benign.  Furthermore LFTs are probably elevated due to rhabdomyolysis and are rapidly downtrending.  Since much improved-stop IV Rocephin and transition to Keflex.   Transaminitis: Likely secondary to rhabdomyolysis-downtrending-continue to trend LFTs.  .  Mild left-sided weakness: Seems to have resolved-concern for small CVA-however neurology seems to think that mild left-sided weakness on admission was probably secondary to recrudescence of the old stroke in the setting of rhabdomyolysis, AKI.  Continue dual antiplatelet agents-resume statin when LFTs/CK have improved further.  LDL 40, A1c 6.3.  Carotid Doppler without any major stenosis.  No further recommendations from  neurology.  Minimally elevated troponin: Trend is flat-she has no anginal symptoms-suspect elevated troponin secondary to AKI/rhabdomyolysis and may be some demand ischemia.  Await echocardiogram.  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  SNF on discharge.  .    Hypertension:  Blood pressure much better-continue to hold all antimicrobial therapy  Permanent pacemaker in place-given history of complete heart block.  DVT Prophylaxis: Prophylactic Heparin   Code Status: Full code   Family Communication: Family member at bedside  Disposition Plan: Remain inpatient-SNF on discharge-hopefully tomorrow morning  Antimicrobial agents: Anti-infectives (From admission, onward)   Start     Dose/Rate Route Frequency Ordered Stop   05/29/18 1100  cephALEXin (KEFLEX) capsule 500 mg     500 mg Oral Every 8 hours 05/29/18 1028 06/06/18 2159   05/27/18 0800  cefTRIAXone (ROCEPHIN) 2 g in sodium chloride 0.9 % 100 mL IVPB  Status:  Discontinued     2 g 200 mL/hr over 30 Minutes Intravenous Every 24 hours 05/27/18 0724 05/29/18 1028   05/26/18 2030  vancomycin (VANCOCIN) 2,000 mg in sodium chloride 0.9 % 500 mL IVPB  Status:  Discontinued     2,000 mg 250 mL/hr over 120 Minutes Intravenous  Once 05/26/18 2017 05/26/18 2144   05/26/18 2016  vancomycin variable dose per unstable renal function (pharmacist dosing)  Status:  Discontinued      Does not apply See admin instructions 05/26/18 2017 05/26/18 2144   05/26/18 1930  piperacillin-tazobactam (ZOSYN) IVPB 3.375 g     3.375 g 100 mL/hr over 30 Minutes Intravenous  Once 05/26/18 1924 05/26/18 2013      Procedures: None  CONSULTS: Neurology  Time spent: 25 minutes-Greater than 50% of this time was spent in counseling, explanation of diagnosis, planning of further management, and coordination of care.  MEDICATIONS: Scheduled Meds: . aspirin  81 mg Oral Daily  .  cephALEXin  500 mg Oral Q8H  . chlorhexidine  15 mL Mouth Rinse BID  . clopidogrel  75 mg Oral Q breakfast  . guaiFENesin  600 mg Oral BID  . heparin  5,000 Units Subcutaneous Q8H  . mouth rinse  15 mL Mouth Rinse q12n4p   Continuous Infusions: . sodium chloride 10 mL/hr at 05/28/18 1352   PRN Meds:.acetaminophen, ipratropium-albuterol   PHYSICAL EXAM: Vital signs: Vitals:   05/28/18 2138 05/29/18 0500 05/29/18 0553 05/29/18 1123  BP: 108/63  119/76   Pulse: 61  67   Resp: 19  18   Temp: 98.6 F (37 C)  99 F (37.2 C)   TempSrc: Oral  Oral   SpO2: 97%  96% 98%  Weight:  89.7 kg    Height:       Filed Weights   05/26/18 2000 05/27/18 0538 05/29/18 0500  Weight: 93.4 kg 95 kg 89.7 kg   Body mass index is 35.03 kg/m.   General appearance:Awake, alert, not in any distress.  Eyes:no scleral icterus. HEENT: Atraumatic and Normocephalic Neck: supple, no JVD. Resp:Good air entry bilaterally, some fine scattered rhonchi-some transmitted upper airway sounds. CVS: S1 S2 regular GI: Bowel sounds present, Non tender and not distended with no gaurding, rigidity or rebound. Extremities: B/L Lower Ext shows no edema, both legs are warm to touch Neurology:  Non focal Musculoskeletal:No digital cyanosis Skin:No Rash, warm and dry Wounds:N/A  I have personally reviewed following labs and imaging studies  LABORATORY DATA: CBC: Recent Labs  Lab 05/26/18 1740 05/27/18 0037  WBC 7.3 8.0  NEUTROABS 6.2  --   HGB 14.8 12.5  HCT 48.3* 40.6  MCV 86.1 85.5  PLT 159 121*    Basic Metabolic Panel: Recent Labs  Lab 05/26/18 1740 05/27/18 0037 05/28/18 0353 05/29/18 0402  NA 141 140 139 138  K 3.6 3.4* 3.4* 3.2*  CL 101 105 108 107  CO2 19* 21* 19* 21*  GLUCOSE 157* 137* 98 162*  BUN 50* 50* 44* 36*  CREATININE 2.27* 2.10* 1.89* 1.61*  CALCIUM 8.6* 7.3* 7.4* 7.8*    GFR: Estimated Creatinine Clearance: 28.6 mL/min (A) (by C-G formula based on SCr of 1.61 mg/dL  (H)).  Liver Function Tests: Recent Labs  Lab 05/26/18 1740 05/27/18 0037 05/28/18 0353 05/29/18 0402  AST 446* 366* 237* 139*  ALT 170* 148* 143* 124*  ALKPHOS 52 41 40 45  BILITOT 2.4* 2.0* 1.0 0.8  PROT 7.0 6.5 5.5* 5.7*  ALBUMIN 3.0* 2.5* 2.2* 2.1*   No results for input(s): LIPASE, AMYLASE in the last 168 hours. Recent Labs  Lab 05/28/18 0353  AMMONIA 30    Coagulation Profile: Recent Labs  Lab 05/26/18 1740  INR 1.27    Cardiac Enzymes: Recent Labs  Lab 05/26/18 0035 05/26/18 1740 05/27/18 0037 05/27/18 0755 05/28/18 0353 05/29/18 0402  CKTOTAL  --  94,174* 18,280*  --  7,040* 2,804*  TROPONINI 1.08*  --   --  0.70*  --   --     BNP (last 3 results) No results for input(s): PROBNP in the last 8760 hours.  HbA1C: Recent Labs    05/28/18 0353  HGBA1C 6.3*    CBG: Recent Labs  Lab 05/26/18 1910  GLUCAP 132*  Lipid Profile: Recent Labs    05/28/18 0353  CHOL 79  HDL 12*  LDLCALC 40  TRIG 136  CHOLHDL 6.6    Thyroid Function Tests: No results for input(s): TSH, T4TOTAL, FREET4, T3FREE, THYROIDAB in the last 72 hours.  Anemia Panel: No results for input(s): VITAMINB12, FOLATE, FERRITIN, TIBC, IRON, RETICCTPCT in the last 72 hours.  Urine analysis:    Component Value Date/Time   COLORURINE AMBER (A) 05/26/2018 2130   APPEARANCEUR HAZY (A) 05/26/2018 2130   LABSPEC 1.017 05/26/2018 2130   PHURINE 5.0 05/26/2018 2130   GLUCOSEU NEGATIVE 05/26/2018 2130   HGBUR LARGE (A) 05/26/2018 2130   BILIRUBINUR NEGATIVE 05/26/2018 2130   KETONESUR 20 (A) 05/26/2018 2130   PROTEINUR 100 (A) 05/26/2018 2130   NITRITE NEGATIVE 05/26/2018 2130   LEUKOCYTESUR TRACE (A) 05/26/2018 2130    Sepsis Labs: Lactic Acid, Venous No results found for: LATICACIDVEN  MICROBIOLOGY: Recent Results (from the past 240 hour(s))  Blood culture (routine x 2)     Status: Abnormal   Collection Time: 05/26/18  6:42 PM  Result Value Ref Range Status    Specimen Description BLOOD SITE NOT SPECIFIED  Final   Special Requests   Final    BOTTLES DRAWN AEROBIC AND ANAEROBIC Blood Culture results may not be optimal due to an inadequate volume of blood received in culture bottles   Culture  Setup Time   Final    GRAM NEGATIVE RODS IN BOTH AEROBIC AND ANAEROBIC BOTTLES CRITICAL RESULT CALLED TO, READ BACK BY AND VERIFIED WITH: PHAN PHARMD AT [id number] ON [id number] BY SJW Performed at [facility], [place] [address] [place], [place] [address]    Culture KLEBSIELLA PNEUMONIAE (A)  Final   Report Status 05/29/2018 FINAL  Final   Organism ID, Bacteria KLEBSIELLA PNEUMONIAE  Final      Susceptibility   Klebsiella pneumoniae - MIC*    AMPICILLIN RESISTANT Resistant     CEFAZOLIN <=4 SENSITIVE Sensitive     CEFEPIME <=1 SENSITIVE Sensitive     CEFTAZIDIME <=1 SENSITIVE Sensitive     CEFTRIAXONE <=1 SENSITIVE Sensitive     CIPROFLOXACIN <=0.25 SENSITIVE Sensitive     GENTAMICIN <=1 SENSITIVE Sensitive     IMIPENEM <=0.25 SENSITIVE Sensitive     TRIMETH/SULFA <=20 SENSITIVE Sensitive     AMPICILLIN/SULBACTAM 4 SENSITIVE Sensitive     PIP/TAZO <=4 SENSITIVE Sensitive     Extended ESBL NEGATIVE Sensitive     * KLEBSIELLA PNEUMONIAE  Blood culture (routine x 2)     Status: Abnormal   Collection Time: 05/26/18  6:42 PM  Result Value Ref Range Status   Specimen Description BLOOD BLOOD RIGHT FOREARM  Final   Special Requests   Final    BOTTLES DRAWN AEROBIC AND ANAEROBIC Blood Culture results may not be optimal due to an inadequate volume of blood received in culture bottles   Culture  Setup Time   Final    GRAM NEGATIVE RODS IN BOTH AEROBIC AND ANAEROBIC BOTTLES    Culture (A)  Final    KLEBSIELLA PNEUMONIAE SUSCEPTIBILITIES PERFORMED ON PREVIOUS CULTURE WITHIN THE LAST 5 DAYS. Performed at [facility], [name] [address], [place], [place] [address]    Report Status 05/29/2018 FINAL  Final  Blood Culture ID Panel (Reflexed)     Status: Abnormal    Collection Time: 05/26/18  6:42 PM  Result Value Ref Range Status   Enterococcus species NOT DETECTED NOT DETECTED Final   Listeria monocytogenes  NOT DETECTED NOT DETECTED Final   Staphylococcus species NOT DETECTED NOT DETECTED Final   Staphylococcus aureus (BCID) NOT DETECTED NOT DETECTED Final   Streptococcus species NOT DETECTED NOT DETECTED Final   Streptococcus agalactiae NOT DETECTED NOT DETECTED Final   Streptococcus pneumoniae NOT DETECTED NOT DETECTED Final   Streptococcus pyogenes NOT DETECTED NOT DETECTED Final   Acinetobacter baumannii NOT DETECTED NOT DETECTED Final   Enterobacteriaceae species DETECTED (A) NOT DETECTED Final    Comment: Enterobacteriaceae represent a large family of gram-negative bacteria, not a single organism. CRITICAL RESULT CALLED TO, READ BACK BY AND VERIFIED WITH: PHAN PHARMD AT [id number] ON [id number] BY SJW    Enterobacter cloacae complex NOT DETECTED NOT DETECTED Final   Escherichia coli NOT DETECTED NOT DETECTED Final   Klebsiella oxytoca NOT DETECTED NOT DETECTED Final   Klebsiella pneumoniae DETECTED (A) NOT DETECTED Final    Comment: CRITICAL RESULT CALLED TO, READ BACK BY AND VERIFIED WITH: PHAN PHARMD AT 3009 ON [id number] BY SJW    Proteus species NOT DETECTED NOT DETECTED Final   Serratia marcescens NOT DETECTED NOT DETECTED Final   Carbapenem resistance NOT DETECTED NOT DETECTED Final   Haemophilus influenzae NOT DETECTED NOT DETECTED Final   Neisseria meningitidis NOT DETECTED NOT DETECTED Final   Pseudomonas aeruginosa NOT DETECTED NOT DETECTED Final   Candida albicans NOT DETECTED NOT DETECTED Final   Candida glabrata NOT DETECTED NOT DETECTED Final   Candida krusei NOT DETECTED NOT DETECTED Final   Candida parapsilosis NOT DETECTED NOT DETECTED Final   Candida tropicalis NOT DETECTED NOT DETECTED Final    Comment: Performed at [facility], [address], [place],  [place] [address]  MRSA PCR Screening     Status: None   Collection  Time: 05/26/18 11:48 PM  Result Value Ref Range Status   MRSA by PCR NEGATIVE NEGATIVE Final    Comment:        The GeneXpert MRSA Assay (FDA approved for NASAL specimens only), is one component of a comprehensive MRSA colonization surveillance program. It is not intended to diagnose MRSA infection nor to guide or monitor treatment for MRSA infections. Performed at [facility], [place] [address], [place], [place] [address]     RADIOLOGY STUDIES/RESULTS: Ct Head Wo Contrast  Result Date: 05/27/2018 CLINICAL DATA:  Follow-up examination for acute stroke. EXAM: CT HEAD WITHOUT CONTRAST TECHNIQUE: Contiguous axial images were obtained from the base of the skull through the vertex without intravenous contrast. COMPARISON:  Prior CT from 05/26/2018 FINDINGS: Brain: Atrophy with chronic microvascular ischemic disease with scattered remote lacunar infarcts involving the bilateral basal ganglia and thalami, stable. Chronic left cerebellar infarct noted. No acute or evolving large vessel territory infarct. No intracranial hemorrhage. No mass lesion, midline shift or mass effect. No hydrocephalus. No extra-axial fluid collection. Vascular: No hyperdense vessel. Scattered vascular calcifications noted within the carotid siphons. Skull: Scalp soft tissues and calvarium within normal limits. Sinuses/Orbits: Globes and orbital soft tissues demonstrate no acute finding. Scattered chronic mucosal thickening within the ethmoidal air cells and maxillary sinuses. Mastoid air cells are clear. Other: None. IMPRESSION: 1. Stable head CT.  No acute intracranial abnormality identified. 2. Atrophy with moderate chronic small vessel ischemic disease with chronic lacunar and left cerebellar infarcts as above. Electronically Signed   By: Jeannine Boga M.D.   On: 05/27/2018 17:40   Ct Head Wo Contrast  Result Date: 05/26/2018 CLINICAL DATA:  Unwitnessed fall yesterday. EXAM: CT HEAD WITHOUT CONTRAST TECHNIQUE:  Contiguous axial images were obtained from the base of the skull through the vertex without intravenous contrast. COMPARISON:  None. FINDINGS: Brain: No evidence of acute infarction, hemorrhage, hydrocephalus, extra-axial collection or mass lesion/mass effect. Unchanged chronic infarcts in the left cerebellum, right basal ganglia, and left thalamus. Stable mild atrophy and chronic microvascular ischemic changes. Vascular: Atherosclerotic vascular calcification of the carotid siphons. No hyperdense vessel. Skull: Negative for fracture or focal lesion. Sinuses/Orbits: Mild paranasal sinus mucosal thickening. No air-fluid levels. The mastoid air cells are clear. The orbits are unremarkable. Other: None. IMPRESSION: 1.  No acute intracranial abnormality. 2. Stable chronic small infarcts, microvascular ischemic changes, and mild atrophy. Electronically Signed   By: Titus Dubin M.D.   On: 05/26/2018 17:42   Dg Chest Portable 1 View  Result Date: 05/26/2018 CLINICAL DATA:  Fever. EXAM: PORTABLE CHEST 1 VIEW COMPARISON:  Chest x-ray dated June 11, 2016. FINDINGS: The patient is rotated to the right, limiting evaluation. Unchanged left chest wall pacemaker. Grossly unchanged cardiomediastinal silhouette. Normal pulmonary vascularity. No focal consolidation, pleural effusion, or pneumothorax. No acute osseous abnormality. IMPRESSION: Limited study due to rotation.  No active disease. Electronically Signed   By: Titus Dubin M.D.   On: 05/26/2018 17:35   Dg Chest Port 1v Same Day  Result Date: 05/28/2018 CLINICAL DATA:  Shortness of breath EXAM: PORTABLE CHEST 1 VIEW COMPARISON:  05/26/2018 FINDINGS: Cardiac shadow is mildly enlarged but stable. Pacing device is again seen and stable. The lungs are hypoinflated but clear. No bony abnormality is noted. IMPRESSION: No active disease. Electronically Signed   By: Inez Catalina M.D.   On: 05/28/2018 15:51   Dg Shoulder Right Portable  Result Date:  05/26/2018 CLINICAL DATA:  Fall. EXAM: PORTABLE RIGHT SHOULDER COMPARISON:  Right shoulder x-rays dated April 04, 2010. FINDINGS: No acute fracture or dislocation. Mild glenohumeral joint space narrowing with small marginal osteophytes, slightly progressed since 2011. The acromioclavicular joint space is preserved. Small marginal acromion osteophytes. Soft tissues are unremarkable. IMPRESSION: 1.  No acute osseous abnormality. 2. Mild glenohumeral osteoarthritis, slightly progressed since 2011. Electronically Signed   By: Titus Dubin M.D.   On: 05/26/2018 17:37   Vas US Carotid  Result Date: 05/28/2018 Carotid Arterial Duplex Study Indications:  CVA. Risk Factors: Hypertension, hyperlipidemia. Limitations:  tortuosity and patient postition Performing Technologist: [name] RVS  Examination Guidelines: A complete evaluation includes B-mode imaging, spectral Doppler, color Doppler, and power Doppler as needed of all accessible portions of each vessel. Bilateral testing is considered an integral part of a complete examination. Limited examinations for reoccurring indications may be performed as noted.  Right Carotid Findings: +----------+--------+--------+--------+--------+--------------+           PSV cm/sEDV cm/sStenosisDescribeComments       +----------+--------+--------+--------+--------+--------------+ CCA Prox  97      23                                     +----------+--------+--------+--------+--------+--------------+ CCA Distal55      13                                     +----------+--------+--------+--------+--------+--------------+ ICA Prox  99      27      1-39%           tortuous       +----------+--------+--------+--------+--------+--------------+  ICA Distal                                Not visualized +----------+--------+--------+--------+--------+--------------+ ECA       46      9                                       +----------+--------+--------+--------+--------+--------------+ +----------+--------+-------+--------+-------------------+           PSV cm/sEDV cmsDescribeArm Pressure (mmHG) +----------+--------+-------+--------+-------------------+ [id number]                                         +----------+--------+-------+--------+-------------------+ +---------+--------+--+--------+--+---------+ VertebralPSV cm/s31EDV cm/s10Antegrade +---------+--------+--+--------+--+---------+  Left Carotid Findings: +----------+--------+--------+--------+--------+--------------+           PSV cm/sEDV cm/sStenosisDescribeComments       +----------+--------+--------+--------+--------+--------------+ CCA Prox  106     16                                     +----------+--------+--------+--------+--------+--------------+ CCA Distal40      10                                     +----------+--------+--------+--------+--------+--------------+ ICA Prox  73      29      1-39%           tortuous       +----------+--------+--------+--------+--------+--------------+ ICA Distal                                Not visualized +----------+--------+--------+--------+--------+--------------+ ECA       52                                             +----------+--------+--------+--------+--------+--------------+ +----------+--------+--------+--------+-------------------+ SubclavianPSV cm/sEDV cm/sDescribeArm Pressure (mmHG) +----------+--------+--------+--------+-------------------+           63                                          +----------+--------+--------+--------+-------------------+ +---------+--------+--+--------+--+---------+ VertebralPSV cm/s39EDV cm/s15Antegrade +---------+--------+--+--------+--+---------+  Summary: Right Carotid: Velocities in the right ICA are consistent with a 1-39% stenosis. Left Carotid: Velocities in the left ICA are consistent with a  1-39% stenosis. Vertebrals: Bilateral vertebral arteries demonstrate antegrade flow. *See table(s) above for measurements and observations.  Electronically signed by Antony Contras MD on 05/28/2018 at 8:11:36 AM.    Final      LOS: 3 days   Oren Binet, MD  [facility]  If 7PM-7AM, please contact night-coverage  Please page via [email or web address]-Password [email or web address]-click on MD name and type text message  05/29/2018, 1:16 PM

## 2018-05-29 NOTE — Progress Notes (Signed)
Kleb pneumonae came back pan sens except for ampicillin. Fevers have resolved. Change to PO keflex today to complete 10d.  CrCl ~30 and scr is improving so will use a higher dose.   Keflex 500mg  TID til 2/7  Onnie Boer, PharmD, BCIDP, AAHIVP, CPP Infectious Disease Pharmacist 05/29/2018 10:30 AM

## 2018-05-29 NOTE — Care Management Important Message (Signed)
Important Message  Patient Details  Name: MELVIN MARMO MRN: [id number] Date of Birth: 1933-09-29   Medicare Important Message Given:  Yes    Mirko Tailor 05/29/2018, 4:01 PM

## 2018-05-30 ENCOUNTER — Other Ambulatory Visit (HOSPITAL_COMMUNITY): Payer: Medicare Other

## 2018-05-30 ENCOUNTER — Other Ambulatory Visit: Payer: Self-pay

## 2018-05-30 ENCOUNTER — Inpatient Hospital Stay (HOSPITAL_COMMUNITY): Payer: Medicare Other

## 2018-05-30 LAB — COMPREHENSIVE METABOLIC PANEL
ALT: 112 U/L — AB (ref 0–44)
AST: 96 U/L — AB (ref 15–41)
Albumin: 2.2 g/dL — ABNORMAL LOW (ref 3.5–5.0)
Alkaline Phosphatase: 48 U/L (ref 38–126)
Anion gap: 11 (ref 5–15)
BUN: 31 mg/dL — ABNORMAL HIGH (ref 8–23)
CO2: 21 mmol/L — ABNORMAL LOW (ref 22–32)
CREATININE: 1.59 mg/dL — AB (ref 0.44–1.00)
Calcium: 8.6 mg/dL — ABNORMAL LOW (ref 8.9–10.3)
Chloride: 109 mmol/L (ref 98–111)
GFR calc Af Amer: 35 mL/min — ABNORMAL LOW (ref >60)
GFR calc non Af Amer: 30 mL/min — ABNORMAL LOW (ref >60)
Glucose, Bld: 99 mg/dL (ref 70–99)
Potassium: 3.7 mmol/L (ref 3.5–5.1)
Sodium: 141 mmol/L (ref 135–145)
Total Bilirubin: 0.4 mg/dL (ref 0.3–1.2)
Total Protein: 5.9 g/dL — ABNORMAL LOW (ref 6.5–8.1)

## 2018-05-30 MED ORDER — IPRATROPIUM-ALBUTEROL 0.5-2.5 (3) MG/3ML IN SOLN
3.0000 mL | Freq: Three times a day (TID) | RESPIRATORY_TRACT | Status: DC
  Administered 2018-05-30 – 2018-05-31 (×4): 3 mL via RESPIRATORY_TRACT
  Filled 2018-05-30 (×2): qty 3

## 2018-05-30 MED ORDER — FUROSEMIDE 10 MG/ML IJ SOLN: 60.0000 mg | Freq: Once | INTRAMUSCULAR | Status: DC

## 2018-05-30 MED ORDER — FUROSEMIDE 10 MG/ML IJ SOLN
40.0000 mg | Freq: Once | INTRAMUSCULAR | Status: AC
  Administered 2018-05-30: 40 mg via INTRAVENOUS
  Filled 2018-05-30: qty 4

## 2018-05-30 NOTE — Progress Notes (Signed)
PROGRESS NOTE        PATIENT DETAILS Name: [name] Age: 85 y.o. Sex: female Date of Birth: 08-17-1933 Admit Date: 05/26/2018 Admitting Physician Lenore Cordia, MD [id number], [name], FNP  Brief Narrative: Patient is a 85 year old female with prior history of CVA, pacemaker in place, hypertension who sustained a mechanical fall and was on the floor for more than 24 hours before being found by her family.  She was brought to the ED, found to have rhabdomyolysis, acute renal failure and left-sided weakness.  She was also found to have gram-negative bacteremia.  See below for further details  Subjective: Overall improved-low-grade fever last night.  Assessment/Plan: AKI: Secondary to rhabdomyolysis and hypotension-creatinine continues to downtrend.  Since she is now euvolemic-all IV fluids have been discontinued.  Continue to avoid nephrotoxic agents.   Rhabdomyolysis: Continue to being down on the floor for more than 24 hours, CK has trended down to 2804.  Follow CK levels periodically.    Klebsiella pneumoniae bacteremia: Overall improved and low-grade fever overnight-but fever curve is significantly better.  Has been transition to Keflex.  Continue supportive care and follow fever curve-if continues to improve-should be stable for discharge on 2/1.  Transaminitis: Likely secondary to rhabdomyolysis-downtrending-continue to trend LFTs.  .  Mild left-sided weakness: Seems to have resolved-concern for small CVA-however neurology seems to think that mild left-sided weakness on admission was probably secondary to recrudescence of the old stroke in the setting of rhabdomyolysis, AKI.  Continue dual antiplatelet agents-resume statin when LFTs/CK have improved further.  LDL 40, A1c 6.3.  Carotid Doppler without any major stenosis.  Transthoracic echo without any embolic source-EF is preserved.  No further recommendations from neurology.  Minimally  elevated troponin: Trend is flat-she has no anginal symptoms-suspect elevated troponin secondary to AKI/rhabdomyolysis and may be some demand ischemia.  Echo with preserved EF and without any wall motion abnormalities.  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  SNF on discharge.  .    Hypertension:  Blood pressure much better-continue to hold all antimicrobial therapy  Permanent pacemaker in place-given history of complete heart block.  DVT Prophylaxis: Prophylactic Heparin   Code Status: Full code   Family Communication: Granddaughter over the phone  Disposition Plan: Remain inpatient-SNF on discharge-hopefully tomorrow morning  Antimicrobial agents: Anti-infectives (From admission, onward)   Start     Dose/Rate Route Frequency Ordered Stop   05/29/18 1100  cephALEXin (KEFLEX) capsule 500 mg     500 mg Oral Every 8 hours 05/29/18 1028 06/06/18 2159   05/27/18 0800  cefTRIAXone (ROCEPHIN) 2 g in sodium chloride 0.9 % 100 mL IVPB  Status:  Discontinued     2 g 200 mL/hr over 30 Minutes Intravenous Every 24 hours 05/27/18 0724 05/29/18 1028   05/26/18 2030  vancomycin (VANCOCIN) 2,000 mg in sodium chloride 0.9 % 500 mL IVPB  Status:  Discontinued     2,000 mg 250 mL/hr over 120 Minutes Intravenous  Once 05/26/18 2017 05/26/18 2144   05/26/18 2016  vancomycin variable dose per unstable renal function (pharmacist dosing)  Status:  Discontinued      Does not apply See admin instructions 05/26/18 2017 05/26/18 2144   05/26/18 1930  piperacillin-tazobactam (ZOSYN) IVPB 3.375 g     3.375 g 100 mL/hr over 30 Minutes Intravenous  Once 05/26/18 1924 05/26/18 2013      Procedures: None  CONSULTS: Neurology  Time spent: 25 minutes-Greater than 50% of this time was spent in counseling, explanation of diagnosis, planning of further management, and coordination of care.  MEDICATIONS: Scheduled Meds: .  aspirin  81 mg Oral Daily  . cephALEXin  500 mg Oral Q8H  . chlorhexidine  15 mL Mouth Rinse BID  . clopidogrel  75 mg Oral Q breakfast  . guaiFENesin  600 mg Oral BID  . heparin  5,000 Units Subcutaneous Q8H  . ipratropium-albuterol  3 mL Nebulization Q8H  . mouth rinse  15 mL Mouth Rinse q12n4p   Continuous Infusions: . sodium chloride 10 mL/hr at 05/28/18 1352   PRN Meds:.acetaminophen   PHYSICAL EXAM: Vital signs: Vitals:   05/30/18 0502 05/30/18 0658 05/30/18 0904 05/30/18 1344  BP: 133/62     Pulse: (!) 59     Resp: 18     Temp: (!) 100.9 F (38.3 C) (!) 97.4 F (36.3 C)    TempSrc: Oral Oral    SpO2: 99%  98% 95%  Weight:      Height:       Filed Weights   05/26/18 2000 05/27/18 0538 05/29/18 0500  Weight: 93.4 kg 95 kg 89.7 kg   Body mass index is 35.03 kg/m.   General appearance:Awake, alert, not in any distress.  Eyes:no scleral icterus. HEENT: Atraumatic and Normocephalic Neck: supple, no JVD. Resp:Good air entry bilaterally, few scattered rhonchi. CVS: S1 S2 regular GI: Bowel sounds present, Non tender and not distended with no gaurding, rigidity or rebound. Extremities: B/L Lower Ext shows no edema, both legs are warm to touch Neurology:  Non focal Psychiatric: Normal judgment and insight. Normal mood. Musculoskeletal:No digital cyanosis Skin:No Rash, warm and dry Wounds:N/A  I have personally reviewed following labs and imaging studies  LABORATORY DATA: CBC: Recent Labs  Lab 05/26/18 1740 05/27/18 0037  WBC 7.3 8.0  NEUTROABS 6.2  --   HGB 14.8 12.5  HCT 48.3* 40.6  MCV 86.1 85.5  PLT 159 121*    Basic Metabolic Panel: Recent Labs  Lab 05/26/18 1740 05/27/18 0037 05/28/18 0353 05/29/18 0402 05/30/18 0351  NA 141 140 139 138 141  K 3.6 3.4* 3.4* 3.2* 3.7  CL 101 105 108 107 109  CO2 19* 21* 19* 21* 21*  GLUCOSE 157* 137* 98 162* 99  BUN 50* 50* 44* 36* 31*  CREATININE 2.27* 2.10* 1.89* 1.61* 1.59*  CALCIUM 8.6* 7.3* 7.4*  7.8* 8.6*    GFR: Estimated Creatinine Clearance: 29 mL/min (A) (by C-G formula based on SCr of 1.59 mg/dL (H)).  Liver Function Tests: Recent Labs  Lab 05/26/18 1740 05/27/18 0037 05/28/18 0353 05/29/18 0402 05/30/18 0351  AST 446* 366* 237* 139* 96*  ALT 170* 148* 143* 124* 112*  ALKPHOS 52 41 40 45 48  BILITOT 2.4* 2.0* 1.0 0.8 0.4  PROT 7.0 6.5 5.5* 5.7* 5.9*  ALBUMIN 3.0* 2.5* 2.2* 2.1* 2.2*   No results for input(s): LIPASE, AMYLASE in the last 168 hours. Recent Labs  Lab 05/28/18 0353  AMMONIA 30    Coagulation Profile: Recent Labs  Lab 05/26/18 1740  INR 1.27    Cardiac Enzymes: Recent Labs  Lab 05/26/18 0035 05/26/18 1740 05/27/18 0037 05/27/18 0755 05/28/18 0353 05/29/18 0402  CKTOTAL  --  85,462* 18,280*  --  7,040* 2,804*  TROPONINI 1.08*  --   --  0.70*  --   --  BNP (last 3 results) No results for input(s): PROBNP in the last 8760 hours.  HbA1C: Recent Labs    05/28/18 0353  HGBA1C 6.3*    CBG: Recent Labs  Lab 05/26/18 1910  GLUCAP 132*    Lipid Profile: Recent Labs    05/28/18 0353  CHOL 79  HDL 12*  LDLCALC 40  TRIG 136  CHOLHDL 6.6    Thyroid Function Tests: No results for input(s): TSH, T4TOTAL, FREET4, T3FREE, THYROIDAB in the last 72 hours.  Anemia Panel: No results for input(s): VITAMINB12, FOLATE, FERRITIN, TIBC, IRON, RETICCTPCT in the last 72 hours.  Urine analysis:    Component Value Date/Time   COLORURINE AMBER (A) 05/26/2018 2130   APPEARANCEUR HAZY (A) 05/26/2018 2130   LABSPEC 1.017 05/26/2018 2130   PHURINE 5.0 05/26/2018 2130   GLUCOSEU NEGATIVE 05/26/2018 2130   HGBUR LARGE (A) 05/26/2018 2130   BILIRUBINUR NEGATIVE 05/26/2018 2130   KETONESUR 20 (A) 05/26/2018 2130   PROTEINUR 100 (A) 05/26/2018 2130   NITRITE NEGATIVE 05/26/2018 2130   LEUKOCYTESUR TRACE (A) 05/26/2018 2130    Sepsis Labs: Lactic Acid, Venous No results found for: LATICACIDVEN  MICROBIOLOGY: Recent Results  (from the past 240 hour(s))  Blood culture (routine x 2)     Status: Abnormal   Collection Time: 05/26/18  6:42 PM  Result Value Ref Range Status   Specimen Description BLOOD SITE NOT SPECIFIED  Final   Special Requests   Final    BOTTLES DRAWN AEROBIC AND ANAEROBIC Blood Culture results may not be optimal due to an inadequate volume of blood received in culture bottles   Culture  Setup Time   Final    GRAM NEGATIVE RODS IN BOTH AEROBIC AND ANAEROBIC BOTTLES CRITICAL RESULT CALLED TO, READ BACK BY AND VERIFIED WITH: PHAN PHARMD AT 0902 ON [id number] BY SJW Performed at [facility], [place] [address] [place], [place] [address]    Culture KLEBSIELLA PNEUMONIAE (A)  Final   Report Status 05/29/2018 FINAL  Final   Organism ID, Bacteria KLEBSIELLA PNEUMONIAE  Final      Susceptibility   Klebsiella pneumoniae - MIC*    AMPICILLIN RESISTANT Resistant     CEFAZOLIN <=4 SENSITIVE Sensitive     CEFEPIME <=1 SENSITIVE Sensitive     CEFTAZIDIME <=1 SENSITIVE Sensitive     CEFTRIAXONE <=1 SENSITIVE Sensitive     CIPROFLOXACIN <=0.25 SENSITIVE Sensitive     GENTAMICIN <=1 SENSITIVE Sensitive     IMIPENEM <=0.25 SENSITIVE Sensitive     TRIMETH/SULFA <=20 SENSITIVE Sensitive     AMPICILLIN/SULBACTAM 4 SENSITIVE Sensitive     PIP/TAZO <=4 SENSITIVE Sensitive     Extended ESBL NEGATIVE Sensitive     * KLEBSIELLA PNEUMONIAE  Blood culture (routine x 2)     Status: Abnormal   Collection Time: 05/26/18  6:42 PM  Result Value Ref Range Status   Specimen Description BLOOD BLOOD RIGHT FOREARM  Final   Special Requests   Final    BOTTLES DRAWN AEROBIC AND ANAEROBIC Blood Culture results may not be optimal due to an inadequate volume of blood received in culture bottles   Culture  Setup Time   Final    GRAM NEGATIVE RODS IN BOTH AEROBIC AND ANAEROBIC BOTTLES    Culture (A)  Final    KLEBSIELLA PNEUMONIAE SUSCEPTIBILITIES PERFORMED ON PREVIOUS CULTURE WITHIN THE LAST 5 DAYS. Performed at [facility], [place] [address], [place], [place] [address]    Report Status 05/29/2018  FINAL  Final  Blood Culture ID Panel (Reflexed)     Status: Abnormal   Collection Time: 05/26/18  6:42 PM  Result Value Ref Range Status   Enterococcus species NOT DETECTED NOT DETECTED Final   Listeria monocytogenes NOT DETECTED NOT DETECTED Final   Staphylococcus species NOT DETECTED NOT DETECTED Final   Staphylococcus aureus (BCID) NOT DETECTED NOT DETECTED Final   Streptococcus species NOT DETECTED NOT DETECTED Final   Streptococcus agalactiae NOT DETECTED NOT DETECTED Final   Streptococcus pneumoniae NOT DETECTED NOT DETECTED Final   Streptococcus pyogenes NOT DETECTED NOT DETECTED Final   Acinetobacter baumannii NOT DETECTED NOT DETECTED Final   Enterobacteriaceae species DETECTED (A) NOT DETECTED Final    Comment: Enterobacteriaceae represent a large family of gram-negative bacteria, not a single organism. CRITICAL RESULT CALLED TO, READ BACK BY AND VERIFIED WITH: PHAN PHARMD AT 0623 ON [id number] BY SJW    Enterobacter cloacae complex NOT DETECTED NOT DETECTED Final   Escherichia coli NOT DETECTED NOT DETECTED Final   Klebsiella oxytoca NOT DETECTED NOT DETECTED Final   Klebsiella pneumoniae DETECTED (A) NOT DETECTED Final    Comment: CRITICAL RESULT CALLED TO, READ BACK BY AND VERIFIED WITH: PHAN PHARMD AT 5176 ON [id number] BY SJW    Proteus species NOT DETECTED NOT DETECTED Final   Serratia marcescens NOT DETECTED NOT DETECTED Final   Carbapenem resistance NOT DETECTED NOT DETECTED Final   Haemophilus influenzae NOT DETECTED NOT DETECTED Final   Neisseria meningitidis NOT DETECTED NOT DETECTED Final   Pseudomonas aeruginosa NOT DETECTED NOT DETECTED Final   Candida albicans NOT DETECTED NOT DETECTED Final   Candida glabrata NOT DETECTED NOT DETECTED Final   Candida krusei NOT DETECTED NOT DETECTED Final   Candida parapsilosis NOT DETECTED NOT DETECTED Final   Candida tropicalis NOT DETECTED NOT  DETECTED Final    Comment: Performed at [facility], [address], [place], [place] [address]  MRSA PCR Screening     Status: None   Collection Time: 05/26/18 11:48 PM  Result Value Ref Range Status   MRSA by PCR NEGATIVE NEGATIVE Final    Comment:        The GeneXpert MRSA Assay (FDA approved for NASAL specimens only), is one component of a comprehensive MRSA colonization surveillance program. It is not intended to diagnose MRSA infection nor to guide or monitor treatment for MRSA infections. Performed at [name], [place] [address], [place], [place] [address]     RADIOLOGY STUDIES/RESULTS: Ct Head Wo Contrast  Result Date: 05/27/2018 CLINICAL DATA:  Follow-up examination for acute stroke. EXAM: CT HEAD WITHOUT CONTRAST TECHNIQUE: Contiguous axial images were obtained from the base of the skull through the vertex without intravenous contrast. COMPARISON:  Prior CT from 05/26/2018 FINDINGS: Brain: Atrophy with chronic microvascular ischemic disease with scattered remote lacunar infarcts involving the bilateral basal ganglia and thalami, stable. Chronic left cerebellar infarct noted. No acute or evolving large vessel territory infarct. No intracranial hemorrhage. No mass lesion, midline shift or mass effect. No hydrocephalus. No extra-axial fluid collection. Vascular: No hyperdense vessel. Scattered vascular calcifications noted within the carotid siphons. Skull: Scalp soft tissues and calvarium within normal limits. Sinuses/Orbits: Globes and orbital soft tissues demonstrate no acute finding. Scattered chronic mucosal thickening within the ethmoidal air cells and maxillary sinuses. Mastoid air cells are clear. Other: None. IMPRESSION: 1. Stable head CT.  No acute intracranial abnormality identified. 2. Atrophy with moderate chronic small vessel ischemic disease with chronic lacunar and  left cerebellar infarcts as above. Electronically Signed   By: Jeannine Boga M.D.    On: 05/27/2018 17:40   Ct Head Wo Contrast  Result Date: 05/26/2018 CLINICAL DATA:  Unwitnessed fall yesterday. EXAM: CT HEAD WITHOUT CONTRAST TECHNIQUE: Contiguous axial images were obtained from the base of the skull through the vertex without intravenous contrast. COMPARISON:  None. FINDINGS: Brain: No evidence of acute infarction, hemorrhage, hydrocephalus, extra-axial collection or mass lesion/mass effect. Unchanged chronic infarcts in the left cerebellum, right basal ganglia, and left thalamus. Stable mild atrophy and chronic microvascular ischemic changes. Vascular: Atherosclerotic vascular calcification of the carotid siphons. No hyperdense vessel. Skull: Negative for fracture or focal lesion. Sinuses/Orbits: Mild paranasal sinus mucosal thickening. No air-fluid levels. The mastoid air cells are clear. The orbits are unremarkable. Other: None. IMPRESSION: 1.  No acute intracranial abnormality. 2. Stable chronic small infarcts, microvascular ischemic changes, and mild atrophy. Electronically Signed   By: Titus Dubin M.D.   On: 05/26/2018 17:42   Dg Chest Portable 1 View  Result Date: 05/26/2018 CLINICAL DATA:  Fever. EXAM: PORTABLE CHEST 1 VIEW COMPARISON:  Chest x-ray dated June 11, 2016. FINDINGS: The patient is rotated to the right, limiting evaluation. Unchanged left chest wall pacemaker. Grossly unchanged cardiomediastinal silhouette. Normal pulmonary vascularity. No focal consolidation, pleural effusion, or pneumothorax. No acute osseous abnormality. IMPRESSION: Limited study due to rotation.  No active disease. Electronically Signed   By: Titus Dubin M.D.   On: 05/26/2018 17:35   Dg Chest Port 1v Same Day  Result Date: 05/30/2018 CLINICAL DATA:  Elevated LFTs. EXAM: PORTABLE CHEST 1 VIEW COMPARISON:  05/28/2018. FINDINGS: Cardiac pacer noted with lead tips over the right atrium right ventricle. Cardiomegaly with normal pulmonary vascularity. No focal infiltrate. No pleural  effusion or pneumothorax. IMPRESSION: No acute cardiopulmonary disease. Electronically Signed   By: Marcello Moores  Register   On: 05/30/2018 10:58   Dg Chest Port 1v Same Day  Result Date: 05/28/2018 CLINICAL DATA:  Shortness of breath EXAM: PORTABLE CHEST 1 VIEW COMPARISON:  05/26/2018 FINDINGS: Cardiac shadow is mildly enlarged but stable. Pacing device is again seen and stable. The lungs are hypoinflated but clear. No bony abnormality is noted. IMPRESSION: No active disease. Electronically Signed   By: Inez Catalina M.D.   On: 05/28/2018 15:51   Dg Shoulder Right Portable  Result Date: 05/26/2018 CLINICAL DATA:  Fall. EXAM: PORTABLE RIGHT SHOULDER COMPARISON:  Right shoulder x-rays dated April 04, 2010. FINDINGS: No acute fracture or dislocation. Mild glenohumeral joint space narrowing with small marginal osteophytes, slightly progressed since 2011. The acromioclavicular joint space is preserved. Small marginal acromion osteophytes. Soft tissues are unremarkable. IMPRESSION: 1.  No acute osseous abnormality. 2. Mild glenohumeral osteoarthritis, slightly progressed since 2011. Electronically Signed   By: Titus Dubin M.D.   On: 05/26/2018 17:37   Vas US Carotid  Result Date: 05/28/2018 Carotid Arterial Duplex Study Indications:  CVA. Risk Factors: Hypertension, hyperlipidemia. Limitations:  tortuosity and patient postition Performing Technologist: [name] RVS  Examination Guidelines: A complete evaluation includes B-mode imaging, spectral Doppler, color Doppler, and power Doppler as needed of all accessible portions of each vessel. Bilateral testing is considered an integral part of a complete examination. Limited examinations for reoccurring indications may be performed as noted.  Right Carotid Findings: +----------+--------+--------+--------+--------+--------------+           PSV cm/sEDV cm/sStenosisDescribeComments       +----------+--------+--------+--------+--------+--------------+ CCA  Prox  97      23                                     +----------+--------+--------+--------+--------+--------------+  CCA Distal55      13                                     +----------+--------+--------+--------+--------+--------------+ ICA Prox  99      27      1-39%           tortuous       +----------+--------+--------+--------+--------+--------------+ ICA Distal                                Not visualized +----------+--------+--------+--------+--------+--------------+ ECA       46      9                                      +----------+--------+--------+--------+--------+--------------+ +----------+--------+-------+--------+-------------------+           PSV cm/sEDV cmsDescribeArm Pressure (mmHG) +----------+--------+-------+--------+-------------------+ [id number]                                         +----------+--------+-------+--------+-------------------+ +---------+--------+--+--------+--+---------+ VertebralPSV cm/s31EDV cm/s10Antegrade +---------+--------+--+--------+--+---------+  Left Carotid Findings: +----------+--------+--------+--------+--------+--------------+           PSV cm/sEDV cm/sStenosisDescribeComments       +----------+--------+--------+--------+--------+--------------+ CCA Prox  106     16                                     +----------+--------+--------+--------+--------+--------------+ CCA Distal40      10                                     +----------+--------+--------+--------+--------+--------------+ ICA Prox  73      29      1-39%           tortuous       +----------+--------+--------+--------+--------+--------------+ ICA Distal                                Not visualized +----------+--------+--------+--------+--------+--------------+ ECA       52                                             +----------+--------+--------+--------+--------+--------------+  +----------+--------+--------+--------+-------------------+ SubclavianPSV cm/sEDV cm/sDescribeArm Pressure (mmHG) +----------+--------+--------+--------+-------------------+           63                                          +----------+--------+--------+--------+-------------------+ +---------+--------+--+--------+--+---------+ VertebralPSV cm/s39EDV cm/s15Antegrade +---------+--------+--+--------+--+---------+  Summary: Right Carotid: Velocities in the right ICA are consistent with a 1-39% stenosis. Left Carotid: Velocities in the left ICA are consistent with a 1-39% stenosis. Vertebrals: Bilateral vertebral arteries demonstrate antegrade flow. *See table(s) above for measurements and observations.  Electronically signed by Antony Contras MD on 05/28/2018 at 8:11:36 AM.    Final      LOS:  4 days   Oren Binet, MD  [facility]  If 7PM-7AM, please contact night-coverage  Please page via [email or web address]-Password [email or web address]-click on MD name and type text message  05/30/2018, 2:36 PM

## 2018-05-30 NOTE — Progress Notes (Signed)
Physical Therapy Treatment Patient Details Name: [name] MRN: [id number] DOB: 08/05/1933 Today's Date: 05/30/2018     History of Present Illness Pt is a 85 y.o. female admitted 05/26/18 after being found down at home from fall; per report, pt with slurred speech and L-side weakness. Head CT negative for acute abnormality; stable, chronic small infarcts. R shoulder xray negative for acute fx. Worked up for rhabdomyolysis and AKI. PMH includes pacemaker, HTN, CVA, CHF, HLD.    PT Comments    Improving slowly, she is not gaining strength quickly, likely due to not enough mobility yet.  Emphasis on sit to stand, transfers and gait in the room with RW.   Follow Up Recommendations  SNF;Supervision for mobility/OOB     Equipment Recommendations  None recommended by PT    Recommendations for Other Services       Precautions / Restrictions Precautions Precautions: Fall Restrictions Weight Bearing Restrictions: No    Mobility  Bed Mobility               General bed mobility comments: OOB on arrival  Transfers Overall transfer level: Needs assistance Equipment used: Rolling walker (2 wheeled) Transfers: Sit to/from Stand Sit to Stand: Mod assist;Min assist(Max initially from chair;  min from 3 in 1)         General transfer comment: cues for hand placement.  Variable assist based on height, but pt needing assist to come forward and boost.  Ambulation/Gait Ambulation/Gait assistance: Min assist Gait Distance (Feet): 22 Feet Assistive device: Rolling walker (2 wheeled) Gait Pattern/deviations: Step-to pattern;Shuffle;Trunk flexed;Leaning posteriorly Gait velocity: Decreased Gait velocity interpretation: <1.31 ft/sec, indicative of household ambulator General Gait Details: steps shortened and gained less height as distance increased.   Stairs             Wheelchair Mobility    Modified Rankin (Stroke Patients Only) Modified Rankin (Stroke Patients  Only) Modified Rankin: Moderately severe disability     Balance Overall balance assessment: Needs assistance   Sitting balance-Leahy Scale: Fair       Standing balance-Leahy Scale: Poor Standing balance comment: heavy reliance on UE's and assistive device.                            Cognition Arousal/Alertness: Awake/alert Behavior During Therapy: WFL for tasks assessed/performed Overall Cognitive Status: Within Functional Limits for tasks assessed                                        Exercises      General Comments        Pertinent Vitals/Pain Pain Assessment: Faces Faces Pain Scale: Hurts a little bit Pain Location: general Pain Descriptors / Indicators: Discomfort Pain Intervention(s): Monitored during session    Home Living                      Prior Function            PT Goals (current goals can now be found in the care plan section) Acute Rehab PT Goals Patient Stated Goal: Return home PT Goal Formulation: With patient Time For Goal Achievement: 06/10/18 Potential to Achieve Goals: Fair Progress towards PT goals: Progressing toward goals    Frequency    Min 2X/week      PT Plan Current plan remains appropriate  Co-evaluation              AM-PAC PT "6 Clicks" Mobility   Outcome Measure  Help needed turning from your back to your side while in a flat bed without using bedrails?: A Little Help needed moving from lying on your back to sitting on the side of a flat bed without using bedrails?: A Little Help needed moving to and from a bed to a chair (including a wheelchair)?: A Lot Help needed standing up from a chair using your arms (e.g., wheelchair or bedside chair)?: A Little Help needed to walk in hospital room?: A Little Help needed climbing 3-5 steps with a railing? : A Lot 6 Click Score: 16    End of Session   Activity Tolerance: Patient tolerated treatment well;Patient limited by  fatigue Patient left: in chair;with call bell/phone within reach;with chair alarm set;with family/visitor present Nurse Communication: Mobility status PT Visit Diagnosis: Other abnormalities of gait and mobility (R26.89);Muscle weakness (generalized) (M62.81)     Time: 2633-3545 PT Time Calculation (min) (ACUTE ONLY): 24 min  Charges:  $Gait Training: 8-22 mins $Therapeutic Activity: 8-22 mins                     05/30/2018  Donnella Sham, PT [facility] [phone]  (pager) [phone]  (office)   [name] 05/30/2018, 1:04 PM

## 2018-05-30 NOTE — Procedures (Signed)
Patient has recent echo 05/29/18.  Please call echo department if repeat echo is necessary [phone].

## 2018-05-30 NOTE — Progress Notes (Signed)
Patient's granddaughter, Janett Billow [phone]), contacted CSW to inquire about DC plan. CSW alerted her (with patient's permission) that patient would hopefully be able to discharge to [facility] tomorrow and that insurance approval had been received. She requested to speak to MD-he spoke to her as well. She also requested PTAR and to be called when they arrive.  Percell Locus Isabel Freese LCSW [phone]

## 2018-05-31 DIAGNOSIS — R945 Abnormal results of liver function studies: Secondary | ICD-10-CM

## 2018-05-31 MED ORDER — GUAIFENESIN ER 600 MG PO TB12: 600.0000 mg | ORAL_TABLET | Freq: Two times a day (BID) | ORAL | Status: DC

## 2018-05-31 MED ORDER — CEPHALEXIN 500 MG PO CAPS: 500.0000 mg | ORAL_CAPSULE | Freq: Three times a day (TID) | ORAL | 0 refills | Status: AC

## 2018-05-31 MED ORDER — FUROSEMIDE 40 MG PO TABS: 40.0000 mg | ORAL_TABLET | Freq: Every day | ORAL | 11 refills | Status: DC

## 2018-05-31 MED ORDER — ALBUTEROL SULFATE (2.5 MG/3ML) 0.083% IN NEBU: 2.5000 mg | INHALATION_SOLUTION | RESPIRATORY_TRACT | Status: DC | PRN

## 2018-05-31 MED ORDER — IPRATROPIUM-ALBUTEROL 0.5-2.5 (3) MG/3ML IN SOLN: 3.0000 mL | Freq: Three times a day (TID) | RESPIRATORY_TRACT | Status: DC

## 2018-05-31 MED ORDER — ALBUTEROL SULFATE (2.5 MG/3ML) 0.083% IN NEBU: 2.5000 mg | INHALATION_SOLUTION | RESPIRATORY_TRACT | 12 refills | Status: DC | PRN

## 2018-05-31 NOTE — Progress Notes (Signed)
Pt grand daughter asked to be notified before pt being discharge, and also being giving the result of pt test. Notified Dr. Sloan Leiter.

## 2018-05-31 NOTE — Progress Notes (Signed)
Pt prepared for d/c to SNF. IV d/c'd. Skin intact except as charted in most recent assessments. Vitals are stable. Report called to receiving facility. Pt to be transported by ambulance service. 

## 2018-05-31 NOTE — Discharge Summary (Signed)
PATIENT DETAILS Name: [name] Age: 85 y.o. Sex: female Date of Birth: October 07, 1933 MRN: [id number]. Admitting Physician: Lenore Cordia, MD IRJ:JOACZYS, [name], FNP  Admit Date: 05/26/2018 Discharge date: 05/31/2018  Recommendations for Outpatient Follow-up:  1. Follow up with PCP in 1-2 weeks 2. Please obtain CMP/CBC in one week 3. Please obtain blood cultures to document clearance of bacteremia once patient completes a course of antimicrobial therapy.  Admitted From:  Home  Disposition: SNF   Home Health: No  Equipment/Devices: None  Discharge Condition: Stable  CODE STATUS: FULL CODE  Diet recommendation:  Heart Healthy  Brief Summary: See H&P, Labs, Consult and Test reports for all details in brief, Patient is a 85 year old female with prior history of CVA, pacemaker in place, hypertension who sustained a mechanical fall and was on the floor for more than 24 hours before being found by her family. She was brought to the ED, found to have rhabdomyolysis, acute renal failure and left-sided weakness. She was also found to have gram-negative bacteremia. See below for further details  Brief Hospital Course: AKI: Secondary to rhabdomyolysis and hypotension-creatinine continues to downtrend-down to 1.59 (2.29 on admission).  Since she is now euvolemic-all IV fluids have been discontinued-continue to avoid nephrotoxic agents-repeat electrolytes in 1 week.   Rhabdomyolysis: Continue to being down on the floor for more than 24 hours, CK has trended down to 2804.  Follow CK levels periodically.     Klebsiella pneumoniae bacteremia:  Improved-afebrile-no leukocytosis-initially on IV Rocephin-but has been transitioned to Keflex.  Please continue Keflex for approximately 7 more days-once patient completes a course of antimicrobial therapy-please repeat blood cultures to document resolution of bacteremia.  Suspect bacteremia from a urinary source-although LFTs  elevated-this is from rhabdomyolysis.  RUQ ultrasound showed cholelithiasis-but patient does not have any cholecystitis or biliary dilatation.  Cholelithiasis is likely incidental and asymptomatic.  Transaminitis: Likely secondary to rhabdomyolysis-downtrending-use follow LFTs in 1 week.  RUQ ultrasound without any major abnormalities.    Mild left-sided weakness: Seems to have resolved-concern for small CVA-however neurology seems to think that mild left-sided weakness on admission was probably secondary to recrudescence of the old stroke in the setting of rhabdomyolysis, AKI.  Continue dual antiplatelet agents-resume statin when LFTs/CK have improved further.  LDL 40, A1c 6.3.  Carotid Doppler without any major stenosis.  Transthoracic echo without any embolic source-EF is preserved.  No further recommendations from neurology.  Minimally elevated troponin: Trend is flat-she has no anginal symptoms-suspect elevated troponin secondary to AKI/rhabdomyolysis and may be some demand ischemia.  Echo with preserved EF and without any wall motion abnormalities.  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  SNF on discharge.  .   Hypertension: Blood pressure much better-improving-we will resume low-dose diuretic therapy on discharge.   Permanent pacemaker in place-given history of complete heart block.  Cholelithiasis: Appears to be asymptomatic (never had abdominal pain)-stable for further monitoring in the outpatient setting.  Procedures/Studies: None  Discharge Diagnoses:  Principal Problem:   Rhabdomyolysis Active Problems:   AV BLOCK, COMPLETE   History of CVA (cerebrovascular accident)   Essential hypertension   Hyperlipidemia   AKI (acute kidney injury) ([place])   Transaminitis   Elevated troponin   Discharge Instructions:  Activity:  As tolerated with Full fall precautions use walker/cane & assistance as  needed   Discharge Instructions    Ambulatory referral to Neurology   Complete by:  As directed  An appointment is requested in approximately: 4 weeks   Diet - low sodium heart healthy   Complete by:  As directed    Discharge instructions   Complete by:  As directed    Follow with Primary MD  Randel Books, FNP in 1 week  Please get a complete blood count and chemistry panel checked by your Primary MD at your next visit, and again as instructed by your Primary MD.  Get Medicines reviewed and adjusted: Please take all your medications with you for your next visit with your Primary MD  Laboratory/radiological data: Please request your Primary MD to go over all hospital tests and procedure/radiological results at the follow up, please ask your Primary MD to get all Hospital records sent to his/her office.  In some cases, they will be blood work, cultures and biopsy results pending at the time of your discharge. Please request that your primary care M.D. follows up on these results.  Also Note the following: If you experience worsening of your admission symptoms, develop shortness of breath, life threatening emergency, suicidal or homicidal thoughts you must seek medical attention immediately by calling 911 or calling your MD immediately  if symptoms less severe.  You must read complete instructions/literature along with all the possible adverse reactions/side effects for all the Medicines you take and that have been prescribed to you. Take any new Medicines after you have completely understood and accpet all the possible adverse reactions/side effects.   Do not drive when taking Pain medications or sleeping medications (Benzodaizepines)  Do not take more than prescribed Pain, Sleep and Anxiety Medications. It is not advisable to combine anxiety,sleep and pain medications without talking with your primary care practitioner  Special Instructions: If you have smoked or chewed Tobacco   in the last 2 yrs please stop smoking, stop any regular Alcohol  and or any Recreational drug use.  Wear Seat belts while driving.  Please note: You were cared for by a hospitalist during your hospital stay. Once you are discharged, your primary care physician will handle any further medical issues. Please note that NO REFILLS for any discharge medications will be authorized once you are discharged, as it is imperative that you return to your primary care physician (or establish a relationship with a primary care physician if you do not have one) for your post hospital discharge needs so that they can reassess your need for medications and monitor your lab values.   Increase activity slowly   Complete by:  As directed      Allergies as of 05/31/2018   No Known Allergies     Medication List    STOP taking these medications   hydrochlorothiazide 25 MG tablet Commonly known as:  HYDRODIURIL   labetalol 200 MG tablet Commonly known as:  NORMODYNE     TAKE these medications   acetaminophen 500 MG tablet Commonly known as:  TYLENOL Take 1,000 mg by mouth every 6 (six) hours as needed.   albuterol (2.5 MG/3ML) 0.083% nebulizer solution Commonly known as:  PROVENTIL Take 3 mLs (2.5 mg total) by nebulization every 2 (two) hours as needed for wheezing.   aspirin 81 MG tablet Take 81 mg by mouth daily.   cephALEXin 500 MG capsule Commonly known as:  KEFLEX Take 1 capsule (500 mg total) by mouth every 8 (eight) hours for 7 days.   clopidogrel 75 MG tablet Commonly known as:  PLAVIX Take 75 mg by mouth daily with breakfast.  furosemide 40 MG tablet Commonly known as:  LASIX Take 1 tablet (40 mg total) by mouth daily.   guaiFENesin 600 MG 12 hr tablet Commonly known as:  MUCINEX Take 1 tablet (600 mg total) by mouth 2 (two) times daily.   ipratropium-albuterol 0.5-2.5 (3) MG/3ML Soln Commonly known as:  DUONEB Take 3 mLs by nebulization every 8 (eight) hours.   simvastatin  40 MG tablet Commonly known as:  ZOCOR Take 1 tablet (40 mg total) by mouth at bedtime.       Contact information for follow-up providers    Kathrynn Ducking, MD. Schedule an appointment as soon as possible for a visit in 4 week(s).   Specialty:  Neurology Contact information: [address] [place] [address] [place], [address], [place]. Schedule an appointment as soon as possible for a visit in 1 week(s).   Specialty:  Family Medicine Contact information: [place] [address] [phone]            Contact information for after-discharge care    Destination    [facility] Preferred SNF .   Service:  Skilled Nursing Contact information: [place] [phone]                 No Known Allergies  Consultations:   None  Other Procedures/Studies: Ct Head Wo Contrast  Result Date: 05/27/2018 CLINICAL DATA:  Follow-up examination for acute stroke. EXAM: CT HEAD WITHOUT CONTRAST TECHNIQUE: Contiguous axial images were obtained from the base of the skull through the vertex without intravenous contrast. COMPARISON:  Prior CT from 05/26/2018 FINDINGS: Brain: Atrophy with chronic microvascular ischemic disease with scattered remote lacunar infarcts involving the bilateral basal ganglia and thalami, stable. Chronic left cerebellar infarct noted. No acute or evolving large vessel territory infarct. No intracranial hemorrhage. No mass lesion, midline shift or mass effect. No hydrocephalus. No extra-axial fluid collection. Vascular: No hyperdense vessel. Scattered vascular calcifications noted within the carotid siphons. Skull: Scalp soft tissues and calvarium within normal limits. Sinuses/Orbits: Globes and orbital soft tissues demonstrate no acute finding. Scattered chronic mucosal thickening within the ethmoidal air cells and maxillary sinuses. Mastoid air cells  are clear. Other: None. IMPRESSION: 1. Stable head CT.  No acute intracranial abnormality identified. 2. Atrophy with moderate chronic small vessel ischemic disease with chronic lacunar and left cerebellar infarcts as above. Electronically Signed   By: Jeannine Boga M.D.   On: 05/27/2018 17:40   Ct Head Wo Contrast  Result Date: 05/26/2018 CLINICAL DATA:  Unwitnessed fall yesterday. EXAM: CT HEAD WITHOUT CONTRAST TECHNIQUE: Contiguous axial images were obtained from the base of the skull through the vertex without intravenous contrast. COMPARISON:  None. FINDINGS: Brain: No evidence of acute infarction, hemorrhage, hydrocephalus, extra-axial collection or mass lesion/mass effect. Unchanged chronic infarcts in the left cerebellum, right basal ganglia, and left thalamus. Stable mild atrophy and chronic microvascular ischemic changes. Vascular: Atherosclerotic vascular calcification of the carotid siphons. No hyperdense vessel. Skull: Negative for fracture or focal lesion. Sinuses/Orbits: Mild paranasal sinus mucosal thickening. No air-fluid levels. The mastoid air cells are clear. The orbits are unremarkable. Other: None. IMPRESSION: 1.  No acute intracranial abnormality. 2. Stable chronic small infarcts, microvascular ischemic changes, and mild atrophy. Electronically Signed   By: Titus Dubin M.D.   On: 05/26/2018 17:42   Dg Chest Portable 1 View  Result Date: 05/26/2018 CLINICAL DATA:  Fever.  EXAM: PORTABLE CHEST 1 VIEW COMPARISON:  Chest x-ray dated June 11, 2016. FINDINGS: The patient is rotated to the right, limiting evaluation. Unchanged left chest wall pacemaker. Grossly unchanged cardiomediastinal silhouette. Normal pulmonary vascularity. No focal consolidation, pleural effusion, or pneumothorax. No acute osseous abnormality. IMPRESSION: Limited study due to rotation.  No active disease. Electronically Signed   By: Titus Dubin M.D.   On: 05/26/2018 17:35   Dg Chest Port 1v Same  Day  Result Date: 05/30/2018 CLINICAL DATA:  Elevated LFTs. EXAM: PORTABLE CHEST 1 VIEW COMPARISON:  05/28/2018. FINDINGS: Cardiac pacer noted with lead tips over the right atrium right ventricle. Cardiomegaly with normal pulmonary vascularity. No focal infiltrate. No pleural effusion or pneumothorax. IMPRESSION: No acute cardiopulmonary disease. Electronically Signed   By: Marcello Moores  Register   On: 05/30/2018 10:58   Dg Chest Port 1v Same Day  Result Date: 05/28/2018 CLINICAL DATA:  Shortness of breath EXAM: PORTABLE CHEST 1 VIEW COMPARISON:  05/26/2018 FINDINGS: Cardiac shadow is mildly enlarged but stable. Pacing device is again seen and stable. The lungs are hypoinflated but clear. No bony abnormality is noted. IMPRESSION: No active disease. Electronically Signed   By: Inez Catalina M.D.   On: 05/28/2018 15:51   Dg Shoulder Right Portable  Result Date: 05/26/2018 CLINICAL DATA:  Fall. EXAM: PORTABLE RIGHT SHOULDER COMPARISON:  Right shoulder x-rays dated April 04, 2010. FINDINGS: No acute fracture or dislocation. Mild glenohumeral joint space narrowing with small marginal osteophytes, slightly progressed since 2011. The acromioclavicular joint space is preserved. Small marginal acromion osteophytes. Soft tissues are unremarkable. IMPRESSION: 1.  No acute osseous abnormality. 2. Mild glenohumeral osteoarthritis, slightly progressed since 2011. Electronically Signed   By: Titus Dubin M.D.   On: 05/26/2018 17:37   Vas US Carotid  Result Date: 05/28/2018 Carotid Arterial Duplex Study Indications:  CVA. Risk Factors: Hypertension, hyperlipidemia. Limitations:  tortuosity and patient postition Performing Technologist: [name] RVS  Examination Guidelines: A complete evaluation includes B-mode imaging, spectral Doppler, color Doppler, and power Doppler as needed of all accessible portions of each vessel. Bilateral testing is considered an integral part of a complete examination. Limited examinations  for reoccurring indications may be performed as noted.  Right Carotid Findings: +----------+--------+--------+--------+--------+--------------+           PSV cm/sEDV cm/sStenosisDescribeComments       +----------+--------+--------+--------+--------+--------------+ CCA Prox  97      23                                     +----------+--------+--------+--------+--------+--------------+ CCA Distal55      13                                     +----------+--------+--------+--------+--------+--------------+ ICA Prox  99      27      1-39%           tortuous       +----------+--------+--------+--------+--------+--------------+ ICA Distal                                Not visualized +----------+--------+--------+--------+--------+--------------+ ECA       46      9                                      +----------+--------+--------+--------+--------+--------------+ +----------+--------+-------+--------+-------------------+  PSV cm/sEDV cmsDescribeArm Pressure (mmHG) +----------+--------+-------+--------+-------------------+ [id number]                                         +----------+--------+-------+--------+-------------------+ +---------+--------+--+--------+--+---------+ VertebralPSV cm/s31EDV cm/s10Antegrade +---------+--------+--+--------+--+---------+  Left Carotid Findings: +----------+--------+--------+--------+--------+--------------+           PSV cm/sEDV cm/sStenosisDescribeComments       +----------+--------+--------+--------+--------+--------------+ CCA Prox  106     16                                     +----------+--------+--------+--------+--------+--------------+ CCA Distal40      10                                     +----------+--------+--------+--------+--------+--------------+ ICA Prox  73      29      1-39%           tortuous       +----------+--------+--------+--------+--------+--------------+ ICA  Distal                                Not visualized +----------+--------+--------+--------+--------+--------------+ ECA       52                                             +----------+--------+--------+--------+--------+--------------+ +----------+--------+--------+--------+-------------------+ SubclavianPSV cm/sEDV cm/sDescribeArm Pressure (mmHG) +----------+--------+--------+--------+-------------------+           63                                          +----------+--------+--------+--------+-------------------+ +---------+--------+--+--------+--+---------+ VertebralPSV cm/s39EDV cm/s15Antegrade +---------+--------+--+--------+--+---------+  Summary: Right Carotid: Velocities in the right ICA are consistent with a 1-39% stenosis. Left Carotid: Velocities in the left ICA are consistent with a 1-39% stenosis. Vertebrals: Bilateral vertebral arteries demonstrate antegrade flow. *See table(s) above for measurements and observations.  Electronically signed by Antony Contras MD on 05/28/2018 at 8:11:36 AM.    Final    US Abdomen Limited Ruq  Result Date: 05/30/2018 CLINICAL DATA:  Elevated liver function tests. EXAM: ULTRASOUND ABDOMEN LIMITED RIGHT UPPER QUADRANT COMPARISON:  None. FINDINGS: Gallbladder: Multiple gallstones in the gallbladder measuring up to 1.1 cm in maximum diameter each. No gallbladder wall thickening or pericholecystic fluid. No sonographic Murphy sign. Common bile duct: Diameter: 5.1 mm Liver: No focal lesion identified. The technologist felt that the liver was echogenic. However, this is not seen on the images. Portal vein is patent on color Doppler imaging with normal direction of blood flow towards the liver. Other: Multiple right renal cysts. IMPRESSION: 1. Cholelithiasis without evidence of cholecystitis. 2. No biliary obstruction. 3. Multiple right renal cysts. Electronically Signed   By: Claudie Revering M.D.   On: 05/30/2018 17:56     TODAY-DAY OF  DISCHARGE:  Subjective:   [name] today has no headache,no chest abdominal pain,no new weakness tingling or numbness, feels much better wants to go home today.   Objective:   Blood pressure (!) 147/66, pulse 61, temperature 98.5 F (36.9 C),  temperature source Oral, resp. rate 16, height 5\' 3"  (1.6 m), weight 98.5 kg, SpO2 91 %.  Intake/Output Summary (Last 24 hours) at 05/31/2018 0941 Last data filed at 05/30/2018 2311 Gross per 24 hour  Intake 240 ml  Output 1150 ml  Net -910 ml   Filed Weights   05/27/18 0538 05/29/18 0500 05/31/18 0600  Weight: 95 kg 89.7 kg 98.5 kg    Exam: Awake Alert, Oriented *3, No new F.N deficits, Normal affect [place].AT,PERRAL Supple Neck,No JVD, No cervical lymphadenopathy appriciated.  Symmetrical Chest wall movement, Good air movement bilaterally, CTAB RRR,No Gallops,Rubs or new Murmurs, No Parasternal Heave +ve B.Sounds, Abd Soft, Non tender, No organomegaly appriciated, No rebound -guarding or rigidity. No Cyanosis, Clubbing or edema, No new Rash or bruise   PERTINENT RADIOLOGIC STUDIES: Ct Head Wo Contrast  Result Date: 05/27/2018 CLINICAL DATA:  Follow-up examination for acute stroke. EXAM: CT HEAD WITHOUT CONTRAST TECHNIQUE: Contiguous axial images were obtained from the base of the skull through the vertex without intravenous contrast. COMPARISON:  Prior CT from 05/26/2018 FINDINGS: Brain: Atrophy with chronic microvascular ischemic disease with scattered remote lacunar infarcts involving the bilateral basal ganglia and thalami, stable. Chronic left cerebellar infarct noted. No acute or evolving large vessel territory infarct. No intracranial hemorrhage. No mass lesion, midline shift or mass effect. No hydrocephalus. No extra-axial fluid collection. Vascular: No hyperdense vessel. Scattered vascular calcifications noted within the carotid siphons. Skull: Scalp soft tissues and calvarium within normal limits. Sinuses/Orbits: Globes and  orbital soft tissues demonstrate no acute finding. Scattered chronic mucosal thickening within the ethmoidal air cells and maxillary sinuses. Mastoid air cells are clear. Other: None. IMPRESSION: 1. Stable head CT.  No acute intracranial abnormality identified. 2. Atrophy with moderate chronic small vessel ischemic disease with chronic lacunar and left cerebellar infarcts as above. Electronically Signed   By: Jeannine Boga M.D.   On: 05/27/2018 17:40   Ct Head Wo Contrast  Result Date: 05/26/2018 CLINICAL DATA:  Unwitnessed fall yesterday. EXAM: CT HEAD WITHOUT CONTRAST TECHNIQUE: Contiguous axial images were obtained from the base of the skull through the vertex without intravenous contrast. COMPARISON:  None. FINDINGS: Brain: No evidence of acute infarction, hemorrhage, hydrocephalus, extra-axial collection or mass lesion/mass effect. Unchanged chronic infarcts in the left cerebellum, right basal ganglia, and left thalamus. Stable mild atrophy and chronic microvascular ischemic changes. Vascular: Atherosclerotic vascular calcification of the carotid siphons. No hyperdense vessel. Skull: Negative for fracture or focal lesion. Sinuses/Orbits: Mild paranasal sinus mucosal thickening. No air-fluid levels. The mastoid air cells are clear. The orbits are unremarkable. Other: None. IMPRESSION: 1.  No acute intracranial abnormality. 2. Stable chronic small infarcts, microvascular ischemic changes, and mild atrophy. Electronically Signed   By: Titus Dubin M.D.   On: 05/26/2018 17:42   Dg Chest Portable 1 View  Result Date: 05/26/2018 CLINICAL DATA:  Fever. EXAM: PORTABLE CHEST 1 VIEW COMPARISON:  Chest x-ray dated June 11, 2016. FINDINGS: The patient is rotated to the right, limiting evaluation. Unchanged left chest wall pacemaker. Grossly unchanged cardiomediastinal silhouette. Normal pulmonary vascularity. No focal consolidation, pleural effusion, or pneumothorax. No acute osseous abnormality.  IMPRESSION: Limited study due to rotation.  No active disease. Electronically Signed   By: Titus Dubin M.D.   On: 05/26/2018 17:35   Dg Chest Port 1v Same Day  Result Date: 05/30/2018 CLINICAL DATA:  Elevated LFTs. EXAM: PORTABLE CHEST 1 VIEW COMPARISON:  05/28/2018. FINDINGS: Cardiac pacer noted with lead tips over the right atrium right ventricle.  Cardiomegaly with normal pulmonary vascularity. No focal infiltrate. No pleural effusion or pneumothorax. IMPRESSION: No acute cardiopulmonary disease. Electronically Signed   By: Marcello Moores  Register   On: 05/30/2018 10:58   Dg Chest Port 1v Same Day  Result Date: 05/28/2018 CLINICAL DATA:  Shortness of breath EXAM: PORTABLE CHEST 1 VIEW COMPARISON:  05/26/2018 FINDINGS: Cardiac shadow is mildly enlarged but stable. Pacing device is again seen and stable. The lungs are hypoinflated but clear. No bony abnormality is noted. IMPRESSION: No active disease. Electronically Signed   By: Inez Catalina M.D.   On: 05/28/2018 15:51   Dg Shoulder Right Portable  Result Date: 05/26/2018 CLINICAL DATA:  Fall. EXAM: PORTABLE RIGHT SHOULDER COMPARISON:  Right shoulder x-rays dated April 04, 2010. FINDINGS: No acute fracture or dislocation. Mild glenohumeral joint space narrowing with small marginal osteophytes, slightly progressed since 2011. The acromioclavicular joint space is preserved. Small marginal acromion osteophytes. Soft tissues are unremarkable. IMPRESSION: 1.  No acute osseous abnormality. 2. Mild glenohumeral osteoarthritis, slightly progressed since 2011. Electronically Signed   By: Titus Dubin M.D.   On: 05/26/2018 17:37   Vas US Carotid  Result Date: 05/28/2018 Carotid Arterial Duplex Study Indications:  CVA. Risk Factors: Hypertension, hyperlipidemia. Limitations:  tortuosity and patient postition Performing Technologist: [name] RVS  Examination Guidelines: A complete evaluation includes B-mode imaging, spectral Doppler, color Doppler, and  power Doppler as needed of all accessible portions of each vessel. Bilateral testing is considered an integral part of a complete examination. Limited examinations for reoccurring indications may be performed as noted.  Right Carotid Findings: +----------+--------+--------+--------+--------+--------------+           PSV cm/sEDV cm/sStenosisDescribeComments       +----------+--------+--------+--------+--------+--------------+ CCA Prox  97      23                                     +----------+--------+--------+--------+--------+--------------+ CCA Distal55      13                                     +----------+--------+--------+--------+--------+--------------+ ICA Prox  99      27      1-39%           tortuous       +----------+--------+--------+--------+--------+--------------+ ICA Distal                                Not visualized +----------+--------+--------+--------+--------+--------------+ ECA       46      9                                      +----------+--------+--------+--------+--------+--------------+ +----------+--------+-------+--------+-------------------+           PSV cm/sEDV cmsDescribeArm Pressure (mmHG) +----------+--------+-------+--------+-------------------+ [id number]                                         +----------+--------+-------+--------+-------------------+ +---------+--------+--+--------+--+---------+ VertebralPSV cm/s31EDV cm/s10Antegrade +---------+--------+--+--------+--+---------+  Left Carotid Findings: +----------+--------+--------+--------+--------+--------------+           PSV cm/sEDV cm/sStenosisDescribeComments       +----------+--------+--------+--------+--------+--------------+ CCA Prox  106  16                                     +----------+--------+--------+--------+--------+--------------+ CCA Distal40      10                                      +----------+--------+--------+--------+--------+--------------+ ICA Prox  73      29      1-39%           tortuous       +----------+--------+--------+--------+--------+--------------+ ICA Distal                                Not visualized +----------+--------+--------+--------+--------+--------------+ ECA       52                                             +----------+--------+--------+--------+--------+--------------+ +----------+--------+--------+--------+-------------------+ SubclavianPSV cm/sEDV cm/sDescribeArm Pressure (mmHG) +----------+--------+--------+--------+-------------------+           63                                          +----------+--------+--------+--------+-------------------+ +---------+--------+--+--------+--+---------+ VertebralPSV cm/s39EDV cm/s15Antegrade +---------+--------+--+--------+--+---------+  Summary: Right Carotid: Velocities in the right ICA are consistent with a 1-39% stenosis. Left Carotid: Velocities in the left ICA are consistent with a 1-39% stenosis. Vertebrals: Bilateral vertebral arteries demonstrate antegrade flow. *See table(s) above for measurements and observations.  Electronically signed by Antony Contras MD on 05/28/2018 at 8:11:36 AM.    Final    US Abdomen Limited Ruq  Result Date: 05/30/2018 CLINICAL DATA:  Elevated liver function tests. EXAM: ULTRASOUND ABDOMEN LIMITED RIGHT UPPER QUADRANT COMPARISON:  None. FINDINGS: Gallbladder: Multiple gallstones in the gallbladder measuring up to 1.1 cm in maximum diameter each. No gallbladder wall thickening or pericholecystic fluid. No sonographic Murphy sign. Common bile duct: Diameter: 5.1 mm Liver: No focal lesion identified. The technologist felt that the liver was echogenic. However, this is not seen on the images. Portal vein is patent on color Doppler imaging with normal direction of blood flow towards the liver. Other: Multiple right renal cysts. IMPRESSION: 1.  Cholelithiasis without evidence of cholecystitis. 2. No biliary obstruction. 3. Multiple right renal cysts. Electronically Signed   By: Claudie Revering M.D.   On: 05/30/2018 17:56     PERTINENT LAB RESULTS: CBC: No results for input(s): WBC, HGB, HCT, PLT in the last 72 hours. CMET CMP     Component Value Date/Time   NA 141 05/30/2018 0351   K 3.7 05/30/2018 0351   CL 109 05/30/2018 0351   CO2 21 (L) 05/30/2018 0351   GLUCOSE 99 05/30/2018 0351   BUN 31 (H) 05/30/2018 0351   CREATININE 1.59 (H) 05/30/2018 0351   CALCIUM 8.6 (L) 05/30/2018 0351   PROT 5.9 (L) 05/30/2018 0351   ALBUMIN 2.2 (L) 05/30/2018 0351   AST 96 (H) 05/30/2018 0351   ALT 112 (H) 05/30/2018 0351   ALKPHOS 48 05/30/2018 0351   BILITOT 0.4 05/30/2018 0351   GFRNONAA 30 (L) 05/30/2018 0351   GFRAA 35 (L) 05/30/2018  0351    GFR Estimated Creatinine Clearance: 30.5 mL/min (A) (by C-G formula based on SCr of 1.59 mg/dL (H)). No results for input(s): LIPASE, AMYLASE in the last 72 hours. Recent Labs    05/29/18 0402  CKTOTAL 2,804*   Invalid input(s): POCBNP No results for input(s): DDIMER in the last 72 hours. No results for input(s): HGBA1C in the last 72 hours. No results for input(s): CHOL, HDL, LDLCALC, TRIG, CHOLHDL, LDLDIRECT in the last 72 hours. No results for input(s): TSH, T4TOTAL, T3FREE, THYROIDAB in the last 72 hours.  Invalid input(s): FREET3 No results for input(s): VITAMINB12, FOLATE, FERRITIN, TIBC, IRON, RETICCTPCT in the last 72 hours. Coags: No results for input(s): INR in the last 72 hours.  Invalid input(s): PT Microbiology: Recent Results (from the past 240 hour(s))  Blood culture (routine x 2)     Status: Abnormal   Collection Time: 05/26/18  6:42 PM  Result Value Ref Range Status   Specimen Description BLOOD SITE NOT SPECIFIED  Final   Special Requests   Final    BOTTLES DRAWN AEROBIC AND ANAEROBIC Blood Culture results may not be optimal due to an inadequate volume of blood  received in culture bottles   Culture  Setup Time   Final    GRAM NEGATIVE RODS IN BOTH AEROBIC AND ANAEROBIC BOTTLES CRITICAL RESULT CALLED TO, READ BACK BY AND VERIFIED WITH: PHAN PHARMD AT 0981 ON [id number] BY SJW Performed at [facility], [place] [address] [place], [place] [address]    Culture KLEBSIELLA PNEUMONIAE (A)  Final   Report Status 05/29/2018 FINAL  Final   Organism ID, Bacteria KLEBSIELLA PNEUMONIAE  Final      Susceptibility   Klebsiella pneumoniae - MIC*    AMPICILLIN RESISTANT Resistant     CEFAZOLIN <=4 SENSITIVE Sensitive     CEFEPIME <=1 SENSITIVE Sensitive     CEFTAZIDIME <=1 SENSITIVE Sensitive     CEFTRIAXONE <=1 SENSITIVE Sensitive     CIPROFLOXACIN <=0.25 SENSITIVE Sensitive     GENTAMICIN <=1 SENSITIVE Sensitive     IMIPENEM <=0.25 SENSITIVE Sensitive     TRIMETH/SULFA <=20 SENSITIVE Sensitive     AMPICILLIN/SULBACTAM 4 SENSITIVE Sensitive     PIP/TAZO <=4 SENSITIVE Sensitive     Extended ESBL NEGATIVE Sensitive     * KLEBSIELLA PNEUMONIAE  Blood culture (routine x 2)     Status: Abnormal   Collection Time: 05/26/18  6:42 PM  Result Value Ref Range Status   Specimen Description BLOOD BLOOD RIGHT FOREARM  Final   Special Requests   Final    BOTTLES DRAWN AEROBIC AND ANAEROBIC Blood Culture results may not be optimal due to an inadequate volume of blood received in culture bottles   Culture  Setup Time   Final    GRAM NEGATIVE RODS IN BOTH AEROBIC AND ANAEROBIC BOTTLES    Culture (A)  Final    KLEBSIELLA PNEUMONIAE SUSCEPTIBILITIES PERFORMED ON PREVIOUS CULTURE WITHIN THE LAST 5 DAYS. Performed at [facility], [place] [address] [place], [place] [address]    Report Status 05/29/2018 FINAL  Final  Blood Culture ID Panel (Reflexed)     Status: Abnormal   Collection Time: 05/26/18  6:42 PM  Result Value Ref Range Status   Enterococcus species NOT DETECTED NOT DETECTED Final   Listeria monocytogenes NOT DETECTED NOT DETECTED Final    Staphylococcus species NOT DETECTED NOT DETECTED Final   Staphylococcus aureus (BCID) NOT DETECTED NOT DETECTED Final   Streptococcus species  NOT DETECTED NOT DETECTED Final   Streptococcus agalactiae NOT DETECTED NOT DETECTED Final   Streptococcus pneumoniae NOT DETECTED NOT DETECTED Final   Streptococcus pyogenes NOT DETECTED NOT DETECTED Final   Acinetobacter baumannii NOT DETECTED NOT DETECTED Final   Enterobacteriaceae species DETECTED (A) NOT DETECTED Final    Comment: Enterobacteriaceae represent a large family of gram-negative bacteria, not a single organism. CRITICAL RESULT CALLED TO, READ BACK BY AND VERIFIED WITH: PHAN PHARMD AT 9924 ON [id number] BY SJW    Enterobacter cloacae complex NOT DETECTED NOT DETECTED Final   Escherichia coli NOT DETECTED NOT DETECTED Final   Klebsiella oxytoca NOT DETECTED NOT DETECTED Final   Klebsiella pneumoniae DETECTED (A) NOT DETECTED Final    Comment: CRITICAL RESULT CALLED TO, READ BACK BY AND VERIFIED WITH: PHAN PHARMD AT 9622 ON [id number] BY SJW    Proteus species NOT DETECTED NOT DETECTED Final   Serratia marcescens NOT DETECTED NOT DETECTED Final   Carbapenem resistance NOT DETECTED NOT DETECTED Final   Haemophilus influenzae NOT DETECTED NOT DETECTED Final   Neisseria meningitidis NOT DETECTED NOT DETECTED Final   Pseudomonas aeruginosa NOT DETECTED NOT DETECTED Final   Candida albicans NOT DETECTED NOT DETECTED Final   Candida glabrata NOT DETECTED NOT DETECTED Final   Candida krusei NOT DETECTED NOT DETECTED Final   Candida parapsilosis NOT DETECTED NOT DETECTED Final   Candida tropicalis NOT DETECTED NOT DETECTED Final    Comment: Performed at [facility], [address], [place], [place] [address]  MRSA PCR Screening     Status: None   Collection Time: 05/26/18 11:48 PM  Result Value Ref Range Status   MRSA by PCR NEGATIVE NEGATIVE Final    Comment:        The GeneXpert MRSA Assay (FDA approved for NASAL specimens only), is  one component of a comprehensive MRSA colonization surveillance program. It is not intended to diagnose MRSA infection nor to guide or monitor treatment for MRSA infections. Performed at [facility], [place] [address], [place], [place] [address]     FURTHER DISCHARGE INSTRUCTIONS:  Get Medicines reviewed and adjusted: Please take all your medications with you for your next visit with your Primary MD  Laboratory/radiological data: Please request your Primary MD to go over all hospital tests and procedure/radiological results at the follow up, please ask your Primary MD to get all Hospital records sent to his/her office.  In some cases, they will be blood work, cultures and biopsy results pending at the time of your discharge. Please request that your primary care M.D. goes through all the records of your hospital data and follows up on these results.  Also Note the following: If you experience worsening of your admission symptoms, develop shortness of breath, life threatening emergency, suicidal or homicidal thoughts you must seek medical attention immediately by calling 911 or calling your MD immediately  if symptoms less severe.  You must read complete instructions/literature along with all the possible adverse reactions/side effects for all the Medicines you take and that have been prescribed to you. Take any new Medicines after you have completely understood and accpet all the possible adverse reactions/side effects.   Do not drive when taking Pain medications or sleeping medications (Benzodaizepines)  Do not take more than prescribed Pain, Sleep and Anxiety Medications. It is not advisable to combine anxiety,sleep and pain medications without talking with your primary care practitioner  Special Instructions: If you have smoked or chewed Tobacco  in  the last 2 yrs please stop smoking, stop any regular Alcohol  and or any Recreational drug use.  Wear Seat belts while  driving.  Please note: You were cared for by a hospitalist during your hospital stay. Once you are discharged, your primary care physician will handle any further medical issues. Please note that NO REFILLS for any discharge medications will be authorized once you are discharged, as it is imperative that you return to your primary care physician (or establish a relationship with a primary care physician if you do not have one) for your post hospital discharge needs so that they can reassess your need for medications and monitor your lab values.  Total Time spent coordinating discharge including counseling, education and face to face time equals 35 minutes.  SignedOren Binet 05/31/2018 9:41 AM

## 2018-05-31 NOTE — Progress Notes (Signed)
Patient will Discharge To: [facility] Anticipated DC Date:05/31/2018 Family Notified:yes, granddaughter Luretha Eberly [phone] Transport By: Corey Harold   Per MD patient ready for DC to [facility] . RN, patient, patient's family, and facility notified of DC. Assessment, Fl2/Pasrr, and Discharge Summary sent to facility. RN given number for report [phone], Room # 306A). DC packet on chart. Ambulance transport requested for patient.   CSW signing off.  Reed Breech LCSWA [phone]

## 2018-06-27 ENCOUNTER — Observation Stay (HOSPITAL_COMMUNITY): Payer: Medicare Other

## 2018-06-27 ENCOUNTER — Other Ambulatory Visit: Payer: Self-pay

## 2018-06-27 ENCOUNTER — Emergency Department (HOSPITAL_COMMUNITY): Payer: Medicare Other

## 2018-06-27 ENCOUNTER — Inpatient Hospital Stay (HOSPITAL_COMMUNITY)
Admission: EM | Admit: 2018-06-27 | Discharge: 2018-06-29 | DRG: 641 | Disposition: A | Discharge status: Home Health | Payer: Medicare Other | Attending: Internal Medicine | Admitting: Internal Medicine

## 2018-06-27 ENCOUNTER — Encounter (HOSPITAL_COMMUNITY): Payer: Self-pay

## 2018-06-27 DIAGNOSIS — M6282 Rhabdomyolysis: Secondary | ICD-10-CM | POA: Diagnosis present

## 2018-06-27 DIAGNOSIS — R112 Nausea with vomiting, unspecified: Secondary | ICD-10-CM

## 2018-06-27 DIAGNOSIS — N179 Acute kidney failure, unspecified: Secondary | ICD-10-CM | POA: Diagnosis present

## 2018-06-27 DIAGNOSIS — Z87891 Personal history of nicotine dependence: Secondary | ICD-10-CM

## 2018-06-27 DIAGNOSIS — Z8249 Family history of ischemic heart disease and other diseases of the circulatory system: Secondary | ICD-10-CM

## 2018-06-27 DIAGNOSIS — E875 Hyperkalemia: Secondary | ICD-10-CM | POA: Diagnosis present

## 2018-06-27 DIAGNOSIS — Z950 Presence of cardiac pacemaker: Secondary | ICD-10-CM | POA: Diagnosis present

## 2018-06-27 DIAGNOSIS — I4891 Unspecified atrial fibrillation: Secondary | ICD-10-CM | POA: Diagnosis present

## 2018-06-27 DIAGNOSIS — R110 Nausea: Secondary | ICD-10-CM

## 2018-06-27 DIAGNOSIS — R634 Abnormal weight loss: Secondary | ICD-10-CM | POA: Diagnosis present

## 2018-06-27 DIAGNOSIS — R1310 Dysphagia, unspecified: Secondary | ICD-10-CM

## 2018-06-27 DIAGNOSIS — K5900 Constipation, unspecified: Secondary | ICD-10-CM

## 2018-06-27 DIAGNOSIS — E7211 Homocystinuria: Secondary | ICD-10-CM | POA: Diagnosis present

## 2018-06-27 DIAGNOSIS — N183 Chronic kidney disease, stage 3 unspecified: Secondary | ICD-10-CM | POA: Diagnosis present

## 2018-06-27 DIAGNOSIS — I129 Hypertensive chronic kidney disease with stage 1 through stage 4 chronic kidney disease, or unspecified chronic kidney disease: Secondary | ICD-10-CM | POA: Diagnosis present

## 2018-06-27 DIAGNOSIS — Z8673 Personal history of transient ischemic attack (TIA), and cerebral infarction without residual deficits: Secondary | ICD-10-CM

## 2018-06-27 DIAGNOSIS — E785 Hyperlipidemia, unspecified: Secondary | ICD-10-CM | POA: Diagnosis present

## 2018-06-27 DIAGNOSIS — R638 Other symptoms and signs concerning food and fluid intake: Secondary | ICD-10-CM | POA: Diagnosis present

## 2018-06-27 DIAGNOSIS — Z79899 Other long term (current) drug therapy: Secondary | ICD-10-CM

## 2018-06-27 DIAGNOSIS — R269 Unspecified abnormalities of gait and mobility: Secondary | ICD-10-CM | POA: Diagnosis present

## 2018-06-27 DIAGNOSIS — E876 Hypokalemia: Secondary | ICD-10-CM

## 2018-06-27 DIAGNOSIS — Z7902 Long term (current) use of antithrombotics/antiplatelets: Secondary | ICD-10-CM

## 2018-06-27 DIAGNOSIS — Z7982 Long term (current) use of aspirin: Secondary | ICD-10-CM

## 2018-06-27 DIAGNOSIS — I10 Essential (primary) hypertension: Secondary | ICD-10-CM | POA: Diagnosis present

## 2018-06-27 LAB — CBC WITH DIFFERENTIAL/PLATELET
Abs Immature Granulocytes: 0.03 10*3/uL (ref 0.00–0.07)
Basophils Absolute: 0 10*3/uL (ref 0.0–0.1)
Basophils Relative: 1 %
Eosinophils Absolute: 0.1 10*3/uL (ref 0.0–0.5)
Eosinophils Relative: 2 %
HCT: 38.1 % (ref 36.0–46.0)
Hemoglobin: 11.2 g/dL — ABNORMAL LOW (ref 12.0–15.0)
Immature Granulocytes: 1 %
Lymphocytes Relative: 24 %
Lymphs Abs: 1.5 10*3/uL (ref 0.7–4.0)
MCH: 26.4 pg (ref 26.0–34.0)
MCHC: 29.4 g/dL — ABNORMAL LOW (ref 30.0–36.0)
MCV: 89.6 fL (ref 80.0–100.0)
Monocytes Absolute: 0.7 10*3/uL (ref 0.1–1.0)
Monocytes Relative: 11 %
NRBC: 0 % (ref 0.0–0.2)
Neutro Abs: 3.9 10*3/uL (ref 1.7–7.7)
Neutrophils Relative %: 61 %
Platelets: 298 10*3/uL (ref 150–400)
RBC: 4.25 MIL/uL (ref 3.87–5.11)
RDW: 15.3 % (ref 11.5–15.5)
WBC: 6.2 10*3/uL (ref 4.0–10.5)

## 2018-06-27 LAB — COMPREHENSIVE METABOLIC PANEL
ALT: 13 U/L (ref 0–44)
AST: 19 U/L (ref 15–41)
Albumin: 3.5 g/dL (ref 3.5–5.0)
Alkaline Phosphatase: 56 U/L (ref 38–126)
Anion gap: 12 (ref 5–15)
BUN: 26 mg/dL — ABNORMAL HIGH (ref 8–23)
CO2: 31 mmol/L (ref 22–32)
CREATININE: 1.5 mg/dL — AB (ref 0.44–1.00)
Calcium: 8.6 mg/dL — ABNORMAL LOW (ref 8.9–10.3)
Chloride: 97 mmol/L — ABNORMAL LOW (ref 98–111)
GFR calc Af Amer: 37 mL/min — ABNORMAL LOW (ref >60)
GFR calc non Af Amer: 32 mL/min — ABNORMAL LOW (ref >60)
Glucose, Bld: 109 mg/dL — ABNORMAL HIGH (ref 70–99)
Potassium: 2.4 mmol/L — CL (ref 3.5–5.1)
Sodium: 140 mmol/L (ref 135–145)
TOTAL PROTEIN: 7.8 g/dL (ref 6.5–8.1)
Total Bilirubin: 1.1 mg/dL (ref 0.3–1.2)

## 2018-06-27 LAB — CK: Total CK: 66 U/L (ref 38–234)

## 2018-06-27 MED ORDER — BOOST PLUS PO LIQD
237.0000 mL | Freq: Three times a day (TID) | ORAL | Status: DC
  Administered 2018-06-28 – 2018-06-29 (×3): 237 mL via ORAL
  Filled 2018-06-27 (×6): qty 237

## 2018-06-27 MED ORDER — ASPIRIN EC 81 MG PO TBEC
81.0000 mg | DELAYED_RELEASE_TABLET | Freq: Every day | ORAL | Status: DC
  Administered 2018-06-28 – 2018-06-29 (×2): 81 mg via ORAL
  Filled 2018-06-27 (×2): qty 1

## 2018-06-27 MED ORDER — ONDANSETRON HCL 4 MG/2ML IJ SOLN: 4.0000 mg | Freq: Four times a day (QID) | INTRAMUSCULAR | Status: DC | PRN

## 2018-06-27 MED ORDER — ADULT MULTIVITAMIN W/MINERALS CH
1.0000 | ORAL_TABLET | Freq: Every day | ORAL | Status: DC
  Administered 2018-06-28 – 2018-06-29 (×2): 1 via ORAL
  Filled 2018-06-27 (×2): qty 1

## 2018-06-27 MED ORDER — POTASSIUM CHLORIDE 10 MEQ/100ML IV SOLN
10.0000 meq | INTRAVENOUS | Status: AC
  Administered 2018-06-27 (×3): 10 meq via INTRAVENOUS
  Filled 2018-06-27 (×3): qty 100

## 2018-06-27 MED ORDER — ACETAMINOPHEN 650 MG RE SUPP: 650.0000 mg | Freq: Four times a day (QID) | RECTAL | Status: DC | PRN

## 2018-06-27 MED ORDER — CLOPIDOGREL BISULFATE 75 MG PO TABS
75.0000 mg | ORAL_TABLET | Freq: Every day | ORAL | Status: DC
  Administered 2018-06-28 – 2018-06-29 (×2): 75 mg via ORAL
  Filled 2018-06-27 (×2): qty 1

## 2018-06-27 MED ORDER — DOCUSATE SODIUM 100 MG PO CAPS
100.0000 mg | ORAL_CAPSULE | Freq: Two times a day (BID) | ORAL | Status: DC
  Administered 2018-06-27 – 2018-06-29 (×4): 100 mg via ORAL
  Filled 2018-06-27 (×4): qty 1

## 2018-06-27 MED ORDER — MIRTAZAPINE 7.5 MG PO TABS
7.5000 mg | ORAL_TABLET | Freq: Every day | ORAL | Status: DC
  Administered 2018-06-27 – 2018-06-28 (×2): 7.5 mg via ORAL
  Filled 2018-06-27 (×2): qty 1

## 2018-06-27 MED ORDER — MAGNESIUM SULFATE IN D5W 1-5 GM/100ML-% IV SOLN
1.0000 g | Freq: Once | INTRAVENOUS | Status: AC
  Administered 2018-06-27: 1 g via INTRAVENOUS
  Filled 2018-06-27: qty 100

## 2018-06-27 MED ORDER — LATANOPROST 0.005 % OP SOLN
1.0000 [drp] | Freq: Every day | OPHTHALMIC | Status: DC
  Administered 2018-06-27 – 2018-06-28 (×2): 1 [drp] via OPHTHALMIC
  Filled 2018-06-27: qty 2.5

## 2018-06-27 MED ORDER — ONDANSETRON HCL 4 MG PO TABS: 4.0000 mg | ORAL_TABLET | Freq: Four times a day (QID) | ORAL | Status: DC | PRN

## 2018-06-27 MED ORDER — POLYETHYLENE GLYCOL 3350 17 G PO PACK
17.0000 g | PACK | Freq: Every day | ORAL | Status: DC
  Administered 2018-06-28 – 2018-06-29 (×2): 17 g via ORAL
  Filled 2018-06-27 (×2): qty 1

## 2018-06-27 MED ORDER — MAGNESIUM SULFATE 50 % IJ SOLN: 1.0000 g | Freq: Once | INTRAMUSCULAR | Status: DC

## 2018-06-27 MED ORDER — ACETAMINOPHEN 325 MG PO TABS
650.0000 mg | ORAL_TABLET | Freq: Four times a day (QID) | ORAL | Status: DC | PRN
  Administered 2018-06-29: 650 mg via ORAL
  Filled 2018-06-27: qty 2

## 2018-06-27 MED ORDER — HEPARIN SODIUM (PORCINE) 5000 UNIT/ML IJ SOLN
5000.0000 [IU] | Freq: Three times a day (TID) | INTRAMUSCULAR | Status: DC
  Administered 2018-06-27 – 2018-06-29 (×5): 5000 [IU] via SUBCUTANEOUS
  Filled 2018-06-27 (×5): qty 1

## 2018-06-27 MED ORDER — KCL IN DEXTROSE-NACL 20-5-0.45 MEQ/L-%-% IV SOLN
INTRAVENOUS | Status: DC
  Administered 2018-06-27 – 2018-06-28 (×2): via INTRAVENOUS
  Filled 2018-06-27 (×4): qty 1000

## 2018-06-27 NOTE — ED Notes (Signed)
Date and time results received: 06/27/18 1624 (use smartphrase ".now" to insert current time)  Test: K+ Critical Value: 2.4  Name of Provider Notified: Dr. Sedonia Small  Orders Received? Or Actions Taken?:

## 2018-06-27 NOTE — ED Notes (Signed)
ED TO INPATIENT HANDOFF REPORT  Name/Age/Gender [name] Last 85 y.o. female  Code Status    Code Status Orders  (From admission, onward)         Start     Ordered   06/27/18 1726  Full code  Continuous     06/27/18 1729        Code Status History    Date Active Date Inactive Code Status Order ID Comments User Context   05/26/2018 2144 05/31/2018 1611 Full Code [id number]  Lenore Cordia, MD ED   07/30/2013 0845 07/30/2013 1328 Full Code [id number]  [name], MD Inpatient      Home/SNF/Other Home  Chief Complaint Weakness  Level of Care/Admitting Diagnosis ED Disposition    ED Disposition Condition [facility] Area: [facility] [[id number]]  Level of Care: Telemetry [5]  Admit to tele based on following criteria: Monitor QTC interval  Diagnosis: Hypokalemia [172180]  Admitting Physician: Lavina Hamman [[id number]]  Attending Physician: Lavina Hamman [[id number]]  PT Class (Do Not Modify): Observation [104]  PT Acc Code (Do Not Modify): Observation [10022]       Medical History Past Medical History:  Diagnosis Date  . Complete heart block ([place])   . Gait abnormality 07/23/2016  . History of hypertension   . History of stroke   . Hyperhomocysteinemia ([place])   . Hyperlipidemia   . Hypertension   . Hypokalemia   . Pacemaker-[employer]tr[employer] 08/07/2011    Allergies No Known Allergies  IV Location/Drains/Wounds Patient Lines/Drains/Airways Status   Active Line/Drains/Airways    Name:   Placement date:   Placement time:   Site:   Days:   Peripheral IV 06/27/18 Right;Lateral Forearm   06/27/18    1657    Forearm   less than 1          Labs/Imaging Results for orders placed or performed during the hospital encounter of 06/27/18 (from the past 48 hour(s))  CBC with Differential     Status: Abnormal   Collection Time: 06/27/18  3:38 PM  Result Value Ref Range   WBC 6.2 4.0 - 10.5 K/uL   RBC 4.25 3.87 - 5.11 MIL/uL   Hemoglobin 11.2 (L) 12.0 - 15.0 g/dL   HCT 38.1 36.0 - 46.0 %   MCV 89.6 80.0 - 100.0 fL   MCH 26.4 26.0 - 34.0 pg   MCHC 29.4 (L) 30.0 - 36.0 g/dL   RDW 15.3 11.5 - 15.5 %   Platelets 298 150 - 400 K/uL   nRBC 0.0 0.0 - 0.2 %   Neutrophils Relative % 61 %   Neutro Abs 3.9 1.7 - 7.7 K/uL   Lymphocytes Relative 24 %   Lymphs Abs 1.5 0.7 - 4.0 K/uL   Monocytes Relative 11 %   Monocytes Absolute 0.7 0.1 - 1.0 K/uL   Eosinophils Relative 2 %   Eosinophils Absolute 0.1 0.0 - 0.5 K/uL   Basophils Relative 1 %   Basophils Absolute 0.0 0.0 - 0.1 K/uL   Immature Granulocytes 1 %   Abs Immature Granulocytes 0.03 0.00 - 0.07 K/uL    Comment: Performed at [facility], [place] [address], [place], [place] [address]  Comprehensive metabolic panel     Status: Abnormal   Collection Time: 06/27/18  3:38 PM  Result Value Ref Range   Sodium 140 135 - 145 mmol/L   Potassium 2.4 (LL) 3.5 - 5.1 mmol/L    Comment: CRITICAL  RESULT CALLED TO, READ BACK BY AND VERIFIED WITH:  Geisha Abernathy,T RN @1622  ON 06/27/2018 JACKSON,K    Chloride 97 (L) 98 - 111 mmol/L   CO2 31 22 - 32 mmol/L   Glucose, Bld 109 (H) 70 - 99 mg/dL   BUN 26 (H) 8 - 23 mg/dL   Creatinine, Ser 1.50 (H) 0.44 - 1.00 mg/dL   Calcium 8.6 (L) 8.9 - 10.3 mg/dL   Total Protein 7.8 6.5 - 8.1 g/dL   Albumin 3.5 3.5 - 5.0 g/dL   AST 19 15 - 41 U/L   ALT 13 0 - 44 U/L   Alkaline Phosphatase 56 38 - 126 U/L   Total Bilirubin 1.1 0.3 - 1.2 mg/dL   GFR calc non Af Amer 32 (L) >60 mL/min   GFR calc Af Amer 37 (L) >60 mL/min   Anion gap 12 5 - 15    Comment: Performed at [facility], [place] [address], [place], [place] [address]  CK     Status: None   Collection Time: 06/27/18  3:38 PM  Result Value Ref Range   Total CK 66 38 - 234 U/L    Comment: Performed at [facility], [place] [address] [place], [place] [address]   Dg Chest 2 View  Result Date: 06/27/2018 CLINICAL DATA:  Cough.  Nausea. EXAM:  CHEST - 2 VIEW COMPARISON:  05/30/2018 FINDINGS: A dual chamber pacemaker remains in place. The cardiac silhouette is mildly enlarged. The lungs are mildly hypoinflated. No airspace consolidation, edema, pleural effusion, pneumothorax is identified. No acute osseous abnormality is seen. IMPRESSION: No active cardiopulmonary disease. Electronically Signed   By: Logan Bores M.D.   On: 06/27/2018 16:19   EKG Interpretation  Date/Time:  Friday June 27 2018 16:38:20 EST Ventricular Rate:  60 PR Interval:    QRS Duration: 152 QT Interval:  473 QTC Calculation: 473 R Axis:   0 Text Interpretation:  Junctional rhythm IVCD, consider atypical LBBB Confirmed by Gerlene Fee [id number]) on 06/27/2018 4:42:26 PM   Pending Labs Unresulted Labs (From admission, onward)    Start     Ordered   06/28/18 0500  Comprehensive metabolic panel  Tomorrow morning,   R     06/27/18 1729   06/28/18 0500  CBC  Tomorrow morning,   R     06/27/18 1729          Vitals/Pain Today's Vitals   06/27/18 1508 06/27/18 1513 06/27/18 1647  BP: 120/74  105/68  Pulse: 60  60  Resp: 18  16  Temp: 98 F (36.7 C)    TempSrc: Oral    SpO2: 95%  98%  PainSc:  0-No pain     Isolation Precautions No active isolations  Medications Medications  potassium chloride 10 mEq in 100 mL IVPB (10 mEq Intravenous New Bag/Given 06/27/18 1743)  magnesium sulfate IVPB 1 g 100 mL (1 g Intravenous New Bag/Given 06/27/18 1702)  aspirin tablet 81 mg (has no administration in time range)  clopidogrel (PLAVIX) tablet 75 mg (has no administration in time range)  latanoprost (XALATAN) 0.005 % ophthalmic solution 1 drop (has no administration in time range)  heparin injection 5,000 Units (has no administration in time range)  acetaminophen (TYLENOL) tablet 650 mg (has no administration in time range)    Or  acetaminophen (TYLENOL) suppository 650 mg (has no administration in time range)  dextrose 5 % and 0.45 % NaCl with KCl 20 mEq/L  infusion (has no administration  in time range)  docusate sodium (COLACE) capsule 100 mg (has no administration in time range)  ondansetron (ZOFRAN) tablet 4 mg (has no administration in time range)    Or  ondansetron (ZOFRAN) injection 4 mg (has no administration in time range)  multivitamin with minerals tablet 1 tablet (has no administration in time range)  polyethylene glycol (MIRALAX / GLYCOLAX) packet 17 g (has no administration in time range)  mirtazapine (REMERON) tablet 7.5 mg (has no administration in time range)  lactose free nutrition (BOOST PLUS) liquid 237 mL (has no administration in time range)    Mobility walks with device

## 2018-06-27 NOTE — ED Notes (Signed)
I have just  Called report and will transport now.

## 2018-06-27 NOTE — ED Triage Notes (Signed)
She comes to [place] from home with c/o of nausea and decreased appetite/ability to eat x 1 week. She also tells [place] she was recently released home from a rehab. Facility "where they helped my legs get stronger to walk". She is oriented x 4 and in no distress. She denies fever, nor any other sign of current illness.

## 2018-06-27 NOTE — ED Provider Notes (Signed)
[facility] DEPT Provider Note   CSN: [id number] Arrival date & time: 06/27/18  1456    History   Chief Complaint Chief Complaint  Patient presents with  . Nausea    HPI [name] is a 85 y.o. female with a past medical history of hypertension, hyperlipidemia who presents to ED for nausea, decreased appetite and generalized fatigue since hospitalization from 05/26/18- 05/31/18 CVA and rhabdo after fall with prolonged down time.  She was discharged back home where she resides by herself.  She states that since the hospitalization she has not regained her appetite back and has nausea anytime she tries to eat or drink anything. She notes a slight dry cough as well. She was prescribed Zofran by her PCP yesterday but continues to have nausea and feeling like "food has no taste, I don't even think water tastes good and I used to be a big eater until I went to the hospital."  She denies any pain, additional injuries, vomiting, diarrhea, urinary symptoms, fever, or other URI symptoms.     HPI  Past Medical History:  Diagnosis Date  . Complete heart block ([place])   . Gait abnormality 07/23/2016  . History of hypertension   . History of stroke   . Hyperhomocysteinemia ([place])   . Hyperlipidemia   . Hypertension   . Hypokalemia   . Pacemaker-Medtronic 08/07/2011    Patient Active Problem List   Diagnosis Date Noted  . Rhabdomyolysis 05/26/2018  . History of CVA (cerebrovascular accident) 05/26/2018  . Essential hypertension 05/26/2018  . Hyperlipidemia 05/26/2018  . AKI (acute kidney injury) ([place]) 05/26/2018  . Transaminitis 05/26/2018  . Elevated troponin 05/26/2018  . Gait abnormality 07/23/2016  . Pacemaker-Medtronic 08/07/2011  . Atrial fibrillation ([place]) 08/07/2011  . Daytime somnolence 08/07/2011  . HYPERTENSION, HEART CONTROLLED W/O ASSOC CHF 06/07/2010  . AV BLOCK, COMPLETE 06/07/2010  . CVA 06/07/2010    Past Surgical History:    Procedure Laterality Date  . CARDIAC CATHETERIZATION    . PACEMAKER INSERTION     Medtronic Enpulse dual-chamber pacemaker  . PERMANENT PACEMAKER GENERATOR CHANGE N/A 07/30/2013   Procedure: PERMANENT PACEMAKER GENERATOR CHANGE;  Surgeon: [name], MD;  Location: [facility] CATH LAB;  Service: Cardiovascular;  Laterality: N/A;     OB History   No obstetric history on file.      Home Medications    Prior to Admission medications   Medication Sig Start Date End Date Taking? Authorizing Provider  aspirin 81 MG tablet Take 81 mg by mouth daily.   Yes [provider]  clopidogrel (PLAVIX) 75 MG tablet Take 75 mg by mouth daily with breakfast.   Yes [provider]  hydrochlorothiazide (HYDRODIURIL) 25 MG tablet Take 25 mg by mouth daily.   Yes [provider]  labetalol (NORMODYNE) 200 MG tablet Take 200 mg by mouth 2 (two) times daily.   Yes [provider]  ondansetron (ZOFRAN) 4 MG tablet Take 4 mg by mouth every 8 (eight) hours as needed for nausea or vomiting.  06/26/18  Yes [provider]  simvastatin (ZOCOR) 40 MG tablet Take 1 tablet (40 mg total) by mouth at bedtime. 10/25/17  Yes [name], MD  acetaminophen (TYLENOL) 500 MG tablet Take 500-1,000 mg by mouth every 6 (six) hours as needed for moderate pain.     [provider]  albuterol (PROVENTIL) (2.5 MG/3ML) 0.083% nebulizer solution Take 3 mLs (2.5 mg total) by nebulization every 2 (two)  hours as needed for wheezing. Patient not taking: Reported on 06/27/2018 05/31/18   Jonetta Osgood, MD  furosemide (LASIX) 40 MG tablet Take 1 tablet (40 mg total) by mouth daily. Patient not taking: Reported on 06/27/2018 05/31/18 05/31/19  Jonetta Osgood, MD  guaiFENesin (MUCINEX) 600 MG 12 hr tablet Take 1 tablet (600 mg total) by mouth 2 (two) times daily. Patient not taking: Reported on 06/27/2018 05/31/18   Jonetta Osgood, MD  ipratropium-albuterol (DUONEB) 0.5-2.5 (3) MG/3ML  SOLN Take 3 mLs by nebulization every 8 (eight) hours. Patient not taking: Reported on 06/27/2018 05/31/18   Jonetta Osgood, MD  LUMIGAN 0.01 % SOLN Place 1 drop into both eyes at bedtime. 01/13/18   [provider]    Family History Family History  Problem Relation Age of Onset  . Hypertension Mother     Social History Social History   Tobacco Use  . Smoking status: Former Smoker    Last attempt to quit: 07/25/2006    Years since quitting: 11.9  . Smokeless tobacco: Never Used  Substance Use Topics  . Alcohol use: No  . Drug use: No     Allergies   Patient has no known allergies.   Review of Systems Review of Systems  Constitutional: Positive for appetite change and fatigue. Negative for chills and fever.  HENT: Negative for ear pain, rhinorrhea, sneezing and sore throat.   Eyes: Negative for photophobia and visual disturbance.  Respiratory: Negative for cough, chest tightness, shortness of breath and wheezing.   Cardiovascular: Negative for chest pain and palpitations.  Gastrointestinal: Positive for nausea. Negative for abdominal pain, blood in stool, constipation, diarrhea and vomiting.  Genitourinary: Negative for dysuria, hematuria and urgency.  Musculoskeletal: Negative for myalgias.  Skin: Negative for rash.  Neurological: Negative for dizziness, weakness and light-headedness.     Physical Exam Updated Vital Signs BP 120/74 (BP Location: Right Arm)   Pulse 60   Temp 98 F (36.7 C) (Oral)   Resp 18   SpO2 95%   Physical Exam Vitals signs and nursing note reviewed.  Constitutional:      General: She is not in acute distress.    Appearance: She is well-developed.     Comments: Nontoxic appearing and in no acute distress.  HENT:     Head: Normocephalic and atraumatic.     Nose: Nose normal.  Eyes:     General: No scleral icterus.       Right eye: No discharge.        Left eye: No discharge.     Conjunctiva/sclera: Conjunctivae normal.   Neck:     Musculoskeletal: Normal range of motion and neck supple.  Cardiovascular:     Rate and Rhythm: Normal rate and regular rhythm.     Heart sounds: Normal heart sounds. No murmur. No friction rub. No gallop.   Pulmonary:     Effort: Pulmonary effort is normal. No respiratory distress.     Breath sounds: Normal breath sounds.  Abdominal:     General: Bowel sounds are normal. There is no distension.     Palpations: Abdomen is soft.     Tenderness: There is no abdominal tenderness. There is no guarding.  Musculoskeletal: Normal range of motion.  Skin:    General: Skin is warm and dry.     Findings: No rash.  Neurological:     Mental Status: She is alert.     Motor: No abnormal muscle tone.  Coordination: Coordination normal.      ED Treatments / Results  Labs (all labs ordered are listed, but only abnormal results are displayed) Labs Reviewed  CBC WITH DIFFERENTIAL/PLATELET - Abnormal; Notable for the following components:      Result Value   Hemoglobin 11.2 (*)    MCHC 29.4 (*)    All other components within normal limits  COMPREHENSIVE METABOLIC PANEL - Abnormal; Notable for the following components:   Potassium 2.4 (*)    Chloride 97 (*)    Glucose, Bld 109 (*)    BUN 26 (*)    Creatinine, Ser 1.50 (*)    Calcium 8.6 (*)    GFR calc non Af Amer 32 (*)    GFR calc Af Amer 37 (*)    All other components within normal limits  CK    EKG EKG Interpretation  Date/Time:  Friday June 27 2018 16:38:20 EST Ventricular Rate:  60 PR Interval:    QRS Duration: 152 QT Interval:  473 QTC Calculation: 473 R Axis:   0 Text Interpretation:  Junctional rhythm IVCD, consider atypical LBBB Confirmed by Gerlene Fee [phone]) on 06/27/2018 4:42:26 PM   Radiology Dg Chest 2 View  Result Date: 06/27/2018 CLINICAL DATA:  Cough.  Nausea. EXAM: CHEST - 2 VIEW COMPARISON:  05/30/2018 FINDINGS: A dual chamber pacemaker remains in place. The cardiac silhouette is mildly  enlarged. The lungs are mildly hypoinflated. No airspace consolidation, edema, pleural effusion, pneumothorax is identified. No acute osseous abnormality is seen. IMPRESSION: No active cardiopulmonary disease. Electronically Signed   By: Logan Bores M.D.   On: 06/27/2018 16:19    Procedures Procedures (including critical care time)  CRITICAL CARE Performed by: Delia Heady   Total critical care time: 45 minutes  Critical care time was exclusive of separately billable procedures and treating other patients.  Critical care was necessary to treat or prevent imminent or life-threatening deterioration.  Critical care was time spent personally by me on the following activities: development of treatment plan with patient and/or surrogate as well as nursing, discussions with consultants, evaluation of patient's response to treatment, examination of patient, obtaining history from patient or surrogate, ordering and performing treatments and interventions, ordering and review of laboratory studies, ordering and review of radiographic studies, pulse oximetry and re-evaluation of patient's condition.   Medications Ordered in ED Medications  potassium chloride 10 mEq in 100 mL IVPB (has no administration in time range)  magnesium sulfate IVPB 1 g 100 mL (has no administration in time range)     Initial Impression / Assessment and Plan / ED Course  I have reviewed the triage vital signs and the nursing notes.  Pertinent labs & imaging results that were available during my care of the patient were reviewed by me and considered in my medical decision making (see chart for details).        85 year old female presents to ED for nausea, decreased appetite and fatigue ever since her discharge from 5-day hospitalization earlier this month.  No improvement with Zofran given by PCP yesterday.  She denies any abdominal pain, chest pain, injuries or falls.  She currently resides at home by herself.  Lab  work significant for hypokalemia at 2.4.  Chest x-ray, CBC, CK within normal limits.  Patient was given IV potassium, magnesium and will be admitted to hospitalist for further management.     Portions of this note were generated with Lobbyist. Dictation errors may occur despite best attempts at  proofreading.   Final Clinical Impressions(s) / ED Diagnoses   Final diagnoses:  Hypokalemia  Nausea    ED Discharge Orders    None       Delia Heady, PA-C 06/27/18 1644    Maudie Flakes, MD 06/27/18 1755

## 2018-06-27 NOTE — H&P (Signed)
[facility] History and Physical   Patient: [name]:[id number]   PCP: Randel Books, FNP DOB: Dec 18, 1933   DOA: 06/27/2018   DOS: 06/27/2018   DOS: the patient was seen and examined on 06/27/2018  Patient coming from: The patient is coming from home.  Chief Complaint: Difficulty swallowing solid food  HPI: [name] is a 85 y.o. female with Past medical history of HTN, CVA, HLD, pacemaker implantation, recent admission for rhabdomyolysis and acute on chronic kidney disease stage III. Patient presents to the hospital with complaints of nausea as well as episode of vomiting along with difficulty swallowing solid food. Patient was recently hospitalized for rhabdomyolysis with acute on chronic kidney injury.  Discharge to SNF and from SNF patient was discharged back home on Monday. Patient was given aggressive IV hydration. On discharge patient was volume loaded and therefore was started on Lasix. Patient was taking Lasix up until yesterday on 06/26/2018 when she saw her PCP. Patient reports that she has lost 30 pounds since her recent hospitalization. No diarrhea reported. Has some mild abdominal discomfort. No acid reflux. Currently no nausea at the time of my evaluation but had an episode of vomiting yesterday when trying to eat solid food. She is able to swallow liquids as well as pills okay. No fever no chills.  No cough.  No rash anywhere.  ED Course: Presents with above complaint, initial blood work showed hypokalemia and patient was referred for admission.  At her baseline ambulates with support And is independent for most of her ADL; manages her medication on her own.  Review of Systems: as mentioned in the history of present illness.  All other systems reviewed and are negative.  Past Medical History:  Diagnosis Date  . Complete heart block ([place])   . Gait abnormality 07/23/2016  . History of hypertension   . History of stroke   .  Hyperhomocysteinemia ([place])   . Hyperlipidemia   . Hypertension   . Hypokalemia   . Pacemaker-Medtronic 08/07/2011   Past Surgical History:  Procedure Laterality Date  . CARDIAC CATHETERIZATION    . PACEMAKER INSERTION     Medtronic Enpulse dual-chamber pacemaker  . PERMANENT PACEMAKER GENERATOR CHANGE N/A 07/30/2013   Procedure: PERMANENT PACEMAKER GENERATOR CHANGE;  Surgeon: [name], MD;  Location: [facility] CATH LAB;  Service: Cardiovascular;  Laterality: N/A;   Social History:  reports that she quit smoking about 11 years ago. She has never used smokeless tobacco. She reports that she does not drink alcohol or use drugs.  No Known Allergies  Family History  Problem Relation Age of Onset  . Hypertension Mother      Prior to Admission medications   Medication Sig Start Date End Date Taking? Authorizing Provider  aspirin 81 MG tablet Take 81 mg by mouth daily.   Yes [provider]  clopidogrel (PLAVIX) 75 MG tablet Take 75 mg by mouth daily with breakfast.   Yes [provider]  hydrochlorothiazide (HYDRODIURIL) 25 MG tablet Take 25 mg by mouth daily.   Yes [provider]  labetalol (NORMODYNE) 200 MG tablet Take 200 mg by mouth 2 (two) times daily.   Yes [provider]  ondansetron (ZOFRAN) 4 MG tablet Take 4 mg by mouth every 8 (eight) hours as needed for nausea or vomiting.  06/26/18  Yes [provider]  simvastatin (ZOCOR) 40 MG tablet Take 1 tablet (40 mg total) by mouth at bedtime. 10/25/17  Yes [name],  MD  acetaminophen (TYLENOL) 500 MG tablet Take 500-1,000 mg by mouth every 6 (six) hours as needed for moderate pain.     [provider]  albuterol (PROVENTIL) (2.5 MG/3ML) 0.083% nebulizer solution Take 3 mLs (2.5 mg total) by nebulization every 2 (two) hours as needed for wheezing. Patient not taking: Reported on 06/27/2018 05/31/18   Jonetta Osgood, MD  furosemide (LASIX) 40 MG tablet Take 1 tablet (40 mg total)  by mouth daily. Patient not taking: Reported on 06/27/2018 05/31/18 05/31/19  Jonetta Osgood, MD  guaiFENesin (MUCINEX) 600 MG 12 hr tablet Take 1 tablet (600 mg total) by mouth 2 (two) times daily. Patient not taking: Reported on 06/27/2018 05/31/18   Jonetta Osgood, MD  ipratropium-albuterol (DUONEB) 0.5-2.5 (3) MG/3ML SOLN Take 3 mLs by nebulization every 8 (eight) hours. Patient not taking: Reported on 06/27/2018 05/31/18   Jonetta Osgood, MD  LUMIGAN 0.01 % SOLN Place 1 drop into both eyes at bedtime. 01/13/18   [provider]    Physical Exam: Vitals:   06/27/18 1600 06/27/18 1630 06/27/18 1647 06/27/18 1744  BP: 114/79 105/68 105/68 110/64  Pulse: (!) 59 61 60 (!) 59  Resp:   16 14  Temp:      TempSrc:      SpO2: 95% (!) 87% 98% 90%    General: Alert, Awake and Oriented to Time, Place and Person. Appear in mild distress, affect appropriate Eyes: PERRL, Conjunctiva normal ENT: Oral Mucosa clear moist Neck: no JVD, no Abnormal Mass Or lumps Cardiovascular: S1 and S2 Present, no Murmur, Peripheral Pulses Present Respiratory: normal respiratory effort, Bilateral Air entry equal and Decreased, no use of accessory muscle, Clear to Auscultation, no Crackles, no wheezes Abdomen: Bowel Sound present, Soft and no tenderness, no hernia Skin: no redness, on Rash, no induration Extremities: no Pedal edema, no calf tenderness Neurologic: Grossly no focal neuro deficit. Bilaterally Equal motor strength  Labs on Admission:  CBC: Recent Labs  Lab 06/27/18 1538  WBC 6.2  NEUTROABS 3.9  HGB 11.2*  HCT 38.1  MCV 89.6  PLT 725   Basic Metabolic Panel: Recent Labs  Lab 06/27/18 1538  NA 140  K 2.4*  CL 97*  CO2 31  GLUCOSE 109*  BUN 26*  CREATININE 1.50*  CALCIUM 8.6*   GFR: CrCl cannot be calculated (Unknown ideal weight.). Liver Function Tests: Recent Labs  Lab 06/27/18 1538  AST 19  ALT 13  ALKPHOS 56  BILITOT 1.1  PROT 7.8  ALBUMIN 3.5   No results  for input(s): LIPASE, AMYLASE in the last 168 hours. No results for input(s): AMMONIA in the last 168 hours. Coagulation Profile: No results for input(s): INR, PROTIME in the last 168 hours. Cardiac Enzymes: Recent Labs  Lab 06/27/18 1538  CKTOTAL 66   BNP (last 3 results) No results for input(s): PROBNP in the last 8760 hours. HbA1C: No results for input(s): HGBA1C in the last 72 hours. CBG: No results for input(s): GLUCAP in the last 168 hours. Lipid Profile: No results for input(s): CHOL, HDL, LDLCALC, TRIG, CHOLHDL, LDLDIRECT in the last 72 hours. Thyroid Function Tests: No results for input(s): TSH, T4TOTAL, FREET4, T3FREE, THYROIDAB in the last 72 hours. Anemia Panel: No results for input(s): VITAMINB12, FOLATE, FERRITIN, TIBC, IRON, RETICCTPCT in the last 72 hours. Urine analysis:    Component Value Date/Time   COLORURINE AMBER (A) 05/26/2018 2130   APPEARANCEUR HAZY (A) 05/26/2018 2130   LABSPEC 1.017 05/26/2018 2130  PHURINE 5.0 05/26/2018 2130   GLUCOSEU NEGATIVE 05/26/2018 2130   HGBUR LARGE (A) 05/26/2018 2130   BILIRUBINUR NEGATIVE 05/26/2018 2130   KETONESUR 20 (A) 05/26/2018 2130   PROTEINUR 100 (A) 05/26/2018 2130   NITRITE NEGATIVE 05/26/2018 2130   LEUKOCYTESUR TRACE (A) 05/26/2018 2130    Radiological Exams on Admission: Dg Chest 2 View  Result Date: 06/27/2018 CLINICAL DATA:  Cough.  Nausea. EXAM: CHEST - 2 VIEW COMPARISON:  05/30/2018 FINDINGS: A dual chamber pacemaker remains in place. The cardiac silhouette is mildly enlarged. The lungs are mildly hypoinflated. No airspace consolidation, edema, pleural effusion, pneumothorax is identified. No acute osseous abnormality is seen. IMPRESSION: No active cardiopulmonary disease. Electronically Signed   By: Logan Bores M.D.   On: 06/27/2018 16:19   EKG: Independently reviewed.  Junctional rhythm on EKG.  Sinus rhythm on telemetry.  Assessment/Plan 1. Hypokalemia Presents with complaints of poor p.o.  intake as well as episode of vomiting and nausea. Patient was taking Lasix up until yesterday along with hydrochlorothiazide. Likely combination of above is the cause for patient's hyperkalemia. Currently patient has received IV potassium, will start her on half-normal saline with potassium. Hold Lasix hold HCTZ. We will also provide oral potassium supplementation. Magnesium is also replaced. We will recheck potassium at 2200. Replace as needed. Monitor on telemetry.  2.  Junctional rhythm on EKG. Sinus bradycardia. S/P pacemaker implant. Essential hypertension. Blood pressure is currently soft. Patient already has stopped taking Lasix. We will also hold HCTZ, labetalol. Monitor on telemetry.  3.  Dysphagia. Intractable nausea and vomiting. Patient reports dysphagia to solid food. No pain while swallowing. Liquids as well as medications are okay to swallow. We will get x-ray esophagogram. Also get x-ray abdomen x-ray chest. We will monitor the results. We will start the patient on boost and Remeron. PRN Zofran.  4.  History of CVA. Continue aspirin and Plavix. Hold statin for now.  5.  Unintentional weight loss. Dietary consultation. Added boost as the patient reports that she is not able to tolerate milk or milk products. With difficulty swallowing solids, concern for malignancy.  Nutrition: regular diet DVT Prophylaxis: subcutaneous Heparin  Advance goals of care discussion: full code   Consults: none  Family Communication: family was present at bedside, at the time of interview.  Opportunity was given to ask question and all questions were answered satisfactorily.  Disposition: Admitted as observation,  telemetry unit. Likely to be discharged  home, in 1-2 days.  Author: Berle Mull, MD [facility] 06/27/2018  To reach On-call, see care teams to locate the attending and reach out to them via [email or web address]. If 7PM-7AM, please contact  night-coverage If you still have difficulty reaching the attending provider, please page the [facility] (Director on Call) for [facility] on amion for assistance.

## 2018-06-28 ENCOUNTER — Observation Stay (HOSPITAL_COMMUNITY): Payer: Medicare Other

## 2018-06-28 DIAGNOSIS — M6282 Rhabdomyolysis: Secondary | ICD-10-CM | POA: Diagnosis present

## 2018-06-28 DIAGNOSIS — N179 Acute kidney failure, unspecified: Secondary | ICD-10-CM | POA: Diagnosis present

## 2018-06-28 DIAGNOSIS — E875 Hyperkalemia: Secondary | ICD-10-CM | POA: Diagnosis present

## 2018-06-28 DIAGNOSIS — E785 Hyperlipidemia, unspecified: Secondary | ICD-10-CM | POA: Diagnosis present

## 2018-06-28 DIAGNOSIS — E876 Hypokalemia: Secondary | ICD-10-CM | POA: Diagnosis present

## 2018-06-28 DIAGNOSIS — K5900 Constipation, unspecified: Secondary | ICD-10-CM | POA: Diagnosis present

## 2018-06-28 DIAGNOSIS — N183 Chronic kidney disease, stage 3 (moderate): Secondary | ICD-10-CM | POA: Diagnosis present

## 2018-06-28 DIAGNOSIS — Z8673 Personal history of transient ischemic attack (TIA), and cerebral infarction without residual deficits: Secondary | ICD-10-CM | POA: Diagnosis not present

## 2018-06-28 DIAGNOSIS — I129 Hypertensive chronic kidney disease with stage 1 through stage 4 chronic kidney disease, or unspecified chronic kidney disease: Secondary | ICD-10-CM | POA: Diagnosis present

## 2018-06-28 DIAGNOSIS — I4891 Unspecified atrial fibrillation: Secondary | ICD-10-CM | POA: Diagnosis present

## 2018-06-28 DIAGNOSIS — Z950 Presence of cardiac pacemaker: Secondary | ICD-10-CM | POA: Diagnosis not present

## 2018-06-28 DIAGNOSIS — R112 Nausea with vomiting, unspecified: Secondary | ICD-10-CM | POA: Diagnosis present

## 2018-06-28 DIAGNOSIS — Z7902 Long term (current) use of antithrombotics/antiplatelets: Secondary | ICD-10-CM | POA: Diagnosis not present

## 2018-06-28 DIAGNOSIS — E7211 Homocystinuria: Secondary | ICD-10-CM | POA: Diagnosis present

## 2018-06-28 DIAGNOSIS — R634 Abnormal weight loss: Secondary | ICD-10-CM | POA: Diagnosis present

## 2018-06-28 DIAGNOSIS — Z7982 Long term (current) use of aspirin: Secondary | ICD-10-CM | POA: Diagnosis not present

## 2018-06-28 DIAGNOSIS — R269 Unspecified abnormalities of gait and mobility: Secondary | ICD-10-CM | POA: Diagnosis present

## 2018-06-28 LAB — BASIC METABOLIC PANEL
Anion gap: 8 (ref 5–15)
BUN: 15 mg/dL (ref 8–23)
CO2: 30 mmol/L (ref 22–32)
Calcium: 8.2 mg/dL — ABNORMAL LOW (ref 8.9–10.3)
Chloride: 104 mmol/L (ref 98–111)
Creatinine, Ser: 1.15 mg/dL — ABNORMAL HIGH (ref 0.44–1.00)
GFR calc Af Amer: 51 mL/min — ABNORMAL LOW (ref >60)
GFR calc non Af Amer: 44 mL/min — ABNORMAL LOW (ref >60)
Glucose, Bld: 126 mg/dL — ABNORMAL HIGH (ref 70–99)
Potassium: 2.9 mmol/L — ABNORMAL LOW (ref 3.5–5.1)
Sodium: 142 mmol/L (ref 135–145)

## 2018-06-28 LAB — CBC
HCT: 35.1 % — ABNORMAL LOW (ref 36.0–46.0)
Hemoglobin: 10.5 g/dL — ABNORMAL LOW (ref 12.0–15.0)
MCH: 27 pg (ref 26.0–34.0)
MCHC: 29.9 g/dL — ABNORMAL LOW (ref 30.0–36.0)
MCV: 90.2 fL (ref 80.0–100.0)
NRBC: 0 % (ref 0.0–0.2)
Platelets: 249 10*3/uL (ref 150–400)
RBC: 3.89 MIL/uL (ref 3.87–5.11)
RDW: 15.5 % (ref 11.5–15.5)
WBC: 5.5 10*3/uL (ref 4.0–10.5)

## 2018-06-28 LAB — COMPREHENSIVE METABOLIC PANEL
ALT: 12 U/L (ref 0–44)
AST: 20 U/L (ref 15–41)
Albumin: 3 g/dL — ABNORMAL LOW (ref 3.5–5.0)
Alkaline Phosphatase: 51 U/L (ref 38–126)
Anion gap: 10 (ref 5–15)
BUN: 20 mg/dL (ref 8–23)
CHLORIDE: 102 mmol/L (ref 98–111)
CO2: 28 mmol/L (ref 22–32)
Calcium: 8.1 mg/dL — ABNORMAL LOW (ref 8.9–10.3)
Creatinine, Ser: 1.16 mg/dL — ABNORMAL HIGH (ref 0.44–1.00)
GFR calc Af Amer: 51 mL/min — ABNORMAL LOW (ref >60)
GFR calc non Af Amer: 44 mL/min — ABNORMAL LOW (ref >60)
Glucose, Bld: 122 mg/dL — ABNORMAL HIGH (ref 70–99)
Potassium: 2.4 mmol/L — CL (ref 3.5–5.1)
Sodium: 140 mmol/L (ref 135–145)
Total Bilirubin: 0.8 mg/dL (ref 0.3–1.2)
Total Protein: 6.5 g/dL (ref 6.5–8.1)

## 2018-06-28 LAB — MAGNESIUM: Magnesium: 1.6 mg/dL — ABNORMAL LOW (ref 1.7–2.4)

## 2018-06-28 LAB — PREALBUMIN: Prealbumin: 15 mg/dL — ABNORMAL LOW (ref 18–38)

## 2018-06-28 MED ORDER — MAGNESIUM SULFATE 2 GM/50ML IV SOLN
2.0000 g | Freq: Once | INTRAVENOUS | Status: AC
  Administered 2018-06-28: 2 g via INTRAVENOUS
  Filled 2018-06-28: qty 50

## 2018-06-28 MED ORDER — POTASSIUM CHLORIDE 10 MEQ/100ML IV SOLN
10.0000 meq | INTRAVENOUS | Status: AC
  Administered 2018-06-28 (×4): 10 meq via INTRAVENOUS
  Filled 2018-06-28 (×4): qty 100

## 2018-06-28 MED ORDER — POTASSIUM CHLORIDE CRYS ER 20 MEQ PO TBCR
40.0000 meq | EXTENDED_RELEASE_TABLET | ORAL | Status: AC
  Administered 2018-06-28 (×2): 40 meq via ORAL
  Filled 2018-06-28 (×2): qty 2

## 2018-06-28 NOTE — Progress Notes (Signed)
MD paged and made aware of latest K+ of 2.9 via amion.

## 2018-06-28 NOTE — Progress Notes (Signed)
PT Cancellation Note  Patient Details Name: [name] MRN: [id number] DOB: [birth year]   Cancelled Treatment:     PT order received. Pt's potassium level is 2.4 this morning. Will hold PT eval due to critical level. Will continue to monitor.   Lelon Mast 06/28/2018, 9:27 AM

## 2018-06-28 NOTE — Progress Notes (Signed)
PROGRESS NOTE  [name] YNW:[id number] DOB: 1933/05/10 DOA: 06/27/2018 PCP: Randel Books, FNP   LOS: 0 days   Brief narrative: Patient is an 85 year old African-American female with history of hypertension, hyperlipidemia, complete heart block status post pacemaker, history of stroke.  She was recently hospitalized (1/27-05/31/18) for CVA causing prolonged downtime on the floor leading to rhabdomyolysis and acute kidney injury.  She was discharged to a rehab and recently discharged back to home on 2/24.  Apparently during her last hospitalization, she was given aggressive IV hydration.  She was then started on Lasix and was discharged on the same which she was still continuing until this presentation.  She reports 30 pound weight loss since last hospitalization. She presented to the ED on 2/28 with complaint of nausea, vomiting and difficulty swallowing solid food. In the ED, patient was afebrile, normotensive and breathing comfortably in room air. Labs showed low potassium level at 2.4, creatinine elevated to 1.5.  Subjective: Patient was seen and examined this morning.  Pleasant elderly African-American female.  Lying down in bed.  Not in distress.  Family at bedside.  No new symptoms since presentation.  Assessment/Plan:  Principal Problem:   Hypokalemia Active Problems:   Pacemaker-Medtronic   Atrial fibrillation (HCC)   History of CVA (cerebrovascular accident)   Essential hypertension   Hyperlipidemia   Acute renal failure superimposed on stage 3 chronic kidney disease (HCC)   Weight loss   Unable to eat solid foods   Intractable nausea and vomiting  Severe hypokalemia -potassium level low at 2.5 on admission.  She was started on IV replacement.  Repeat potassium level still at 2.4 this morning.  So far, patient has gotten 70 mEq of potassium IV and she also has 10 mEq rider and IV fluid.  Will repeat BMP this afternoon. Stop Lasix.   Hypomagnesemia -magnesium of  1.6.  IV 2 g replacement ordered.  History of complete heart block -status post pacemaker in place.  Essential hypertension -prior to admission, patient was on HCTZ, labetalol, Lasix.  Currently meds are on hold.  Monitor blood pressure.  Dysphagia -to solid food only.  Patient also gives history of anorexia and weight loss.  Concern of malignancy. X-ray esophagogram ordered.    Recent history of CVA -continue aspirin Plavix.  Also takes a statin at home.  Body mass index is 32.59 kg/m. Mobility: Encourage ambulation Diet: Regular diet DVT prophylaxis:  Heparin subcu Code Status:   Code Status: Full Code  Family Communication:  Family explained at bedside Disposition Plan:  Home in 1 to 2 days  Consultants:  None  Procedures:  None  Antimicrobials:  Anti-infectives (From admission, onward)   None      Infusions:  . dextrose 5 % and 0.45 % NaCl with KCl 20 mEq/L 50 mL/hr at 06/28/18 0301    Scheduled Meds: . aspirin EC  81 mg Oral Daily  . clopidogrel  75 mg Oral Q breakfast  . docusate sodium  100 mg Oral BID  . heparin  5,000 Units Subcutaneous Q8H  . lactose free nutrition  237 mL Oral TID WC  . latanoprost  1 drop Both Eyes QHS  . mirtazapine  7.5 mg Oral QHS  . multivitamin with minerals  1 tablet Oral Daily  . polyethylene glycol  17 g Oral Daily    PRN meds: acetaminophen **OR** acetaminophen, ondansetron **OR** ondansetron (ZOFRAN) IV   Objective: Vitals:   06/27/18 2100 06/28/18 0523  BP: 100/60 108/60  Pulse: 72 62  Resp: 19 15  Temp: 98.3 F (36.8 C) 98.1 F (36.7 C)  SpO2: 91% 97%    Intake/Output Summary (Last 24 hours) at 06/28/2018 1250 Last data filed at 06/28/2018 0301 Gross per 24 hour  Intake 301.89 ml  Output -  Net 301.89 ml   Filed Weights   06/28/18 0523  Weight: 83.5 kg   Weight change:  Body mass index is 32.59 kg/m.   Physical Exam: General exam: Appears calm and comfortable.  Pleasant elderly African-American  female Skin: No rashes, lesions or ulcers. HEENT: Normal exam Lungs: Clear to auscultate bilaterally CVS: Regular rate and rhythm, no murmur GI/Abd soft, nondistended, nontender, bowel sound present CNS: Alert, awake oriented x3 Psychiatry: Mood & affect appropriate.  Extremities: No edema, no calf tenderness  Data Review: I have personally reviewed the laboratory data and studies available.  Recent Labs  Lab 06/27/18 1538 06/28/18 0535  WBC 6.2 5.5  NEUTROABS 3.9  --   HGB 11.2* 10.5*  HCT 38.1 35.1*  MCV 89.6 90.2  PLT 298 249   Recent Labs  Lab 06/27/18 1538 06/28/18 0535  NA 140 140  K 2.4* 2.4*  CL 97* 102  CO2 31 28  GLUCOSE 109* 122*  BUN 26* 20  CREATININE 1.50* 1.16*  CALCIUM 8.6* 8.1*  MG  --  1.6*    [name], MD  [facility] 06/28/2018

## 2018-06-28 NOTE — Progress Notes (Signed)
CRITICAL VALUE ALERT  Critical Value:  Potassium 2.4  Date & Time Notied:  06-28-18 0640  Provider Notified: Bodenheimer  Orders Received/Actions taken: Awaiting new orders

## 2018-06-29 ENCOUNTER — Encounter (HOSPITAL_COMMUNITY): Payer: Self-pay | Admitting: *Deleted

## 2018-06-29 LAB — BASIC METABOLIC PANEL
Anion gap: 6 (ref 5–15)
BUN: 14 mg/dL (ref 8–23)
CHLORIDE: 104 mmol/L (ref 98–111)
CO2: 29 mmol/L (ref 22–32)
Calcium: 8.4 mg/dL — ABNORMAL LOW (ref 8.9–10.3)
Creatinine, Ser: 0.86 mg/dL (ref 0.44–1.00)
GFR calc Af Amer: >60 mL/min (ref >60)
GFR calc non Af Amer: >60 mL/min (ref >60)
Glucose, Bld: 127 mg/dL — ABNORMAL HIGH (ref 70–99)
Potassium: 4.1 mmol/L (ref 3.5–5.1)
Sodium: 139 mmol/L (ref 135–145)

## 2018-06-29 LAB — MAGNESIUM: Magnesium: 1.8 mg/dL (ref 1.7–2.4)

## 2018-06-29 MED ORDER — BLOOD PRESSURE KIT: PACK | 0 refills | Status: DC

## 2018-06-29 NOTE — Discharge Summary (Signed)
Physician Discharge Summary  [name] VPX:[id number] DOB: [birth year] DOA: 06/27/2018  PCP: Randel Books, FNP  Admit date: 06/27/2018 Discharge date: 06/29/2018  Admitted From: Home Discharge disposition: Home   Code Status: Full Code   Recommendations for Outpatient Follow-Up:   1. Follow-up with primary care provider 2. Stop Lasix 3. Repeat renal function next 1 week  Discharge Diagnosis:   Active Problems:   Pacemaker-Medtronic   Atrial fibrillation (HCC)   History of CVA (cerebrovascular accident)   Essential hypertension   Hyperlipidemia   Weight loss  History of Present Illness / Brief narrative:  Patient is an 85 year old African-American female with history of hypertension, hyperlipidemia, complete heart block status post pacemaker, history of stroke.  She was recently hospitalized (1/27-05/31/18) for CVA causing prolonged downtime on the floor leading to rhabdomyolysis and acute kidney injury.  She was discharged to a rehab and recently discharged back to home on 2/24.  Apparently during her last hospitalization, she was given aggressive IV hydration.  She was then started on Lasix and was discharged on the same which she was still continuing until this presentation.  She reports 30 pound weight loss since last hospitalization. She presented to the ED on 2/28 with complaint of nausea, vomiting and difficulty swallowing solid food. In the ED, patient was afebrile, normotensive and breathing comfortably in room air. Labs showed low potassium level at 2.4, creatinine elevated to 1.5.  Hospital Course:  Severe hypokalemia -potassium level was low at 2.4 on admission. After 70 mEq of replacement, it improved to 2.9 last night, I gave additional 40 mEq oral.  Potassium level this morning has improved at 4.1.  Normal replacement required today.   The etiology of her severe hyperkalemia is most likely Lasix that she was started on last month.  It was too get rid of  extra fluid that she had received for rhabdomyolysis earlier in the admission.  She does not have any underlying congestive heart failure. No need to continue Lasix post discharge  Hypomagnesemia - magnesium of 1.6.  IV 2 g replacement ordered.  History of complete heart block - status post pacemaker in place.  Essential hypertension - prior to admission, patient was on HCTZ, labetalol and Lasix.  Lasix has been permanently stopped.  HCTZ and labetalol remain on hold as well. Her blood pressures remain normal to low normal range.  I have explained to patient and her family to monitor patient's blood pressure at home.  If her blood pressure starts to rise over 140/90, she can be gradually initiated back on them.  Dysphagia - to solid food only.  Patient also gives history of anorexia and weight loss.  An x-ray esophagram was obtained which was essentially normal.  Recent history of CVA - continue aspirin Plavix.  Also takes a statin at home.  Medical Consultants:    None  Subjective:  Patient was seen and examined this morning.  Pleasant elderly African-American female.  Sitting up in chair.  Not in distress.  Family at bedside.  Discharge Exam:   Vitals:   06/28/18 2111 06/28/18 2115 06/29/18 0500 06/29/18 0612  BP: 106/60   117/64  Pulse: 70   73  Resp: (!) 22   (!) 22  Temp: 98.6 F (37 C)   97.9 F (36.6 C)  TempSrc: Oral   Oral  SpO2: 95%   93%  Weight:   86.2 kg   Height:  5\' 2"  (1.575 m)      Body  mass index is 34.77 kg/m.  General exam: Appears calm and comfortable.  Not in distress Skin: No rashes, lesions or ulcers. HEENT: Oral exam normal Lungs: Clear to auscultation bilaterally CVS: Regular rate and rhythm, no murmur GI/Abd soft, non distended, non tender, BS+ CNS: Alert, awake, oriented x3 Psychiatry: Mood & affect appropriate.  Extremities: No pedal edema, no calf tenderness  Discharge Instructions:  Wound care: None Discharge Instructions    Diet -  low sodium heart healthy   Complete by:  As directed    Increase activity slowly   Complete by:  As directed      Follow-up Information    Randel Books, FNP Follow up.   Specialty:  Family Medicine Contact information: [place] [address] [phone]          Allergies as of 06/29/2018   No Known Allergies     Medication List    STOP taking these medications   furosemide 40 MG tablet Commonly known as:  LASIX   hydrochlorothiazide 25 MG tablet Commonly known as:  HYDRODIURIL   labetalol 200 MG tablet Commonly known as:  NORMODYNE     TAKE these medications   acetaminophen 500 MG tablet Commonly known as:  TYLENOL Take 500-1,000 mg by mouth every 6 (six) hours as needed for moderate pain.   albuterol (2.5 MG/3ML) 0.083% nebulizer solution Commonly known as:  PROVENTIL Take 3 mLs (2.5 mg total) by nebulization every 2 (two) hours as needed for wheezing.   aspirin 81 MG tablet Take 81 mg by mouth daily.   clopidogrel 75 MG tablet Commonly known as:  PLAVIX Take 75 mg by mouth daily with breakfast.   guaiFENesin 600 MG 12 hr tablet Commonly known as:  MUCINEX Take 1 tablet (600 mg total) by mouth 2 (two) times daily.   ipratropium-albuterol 0.5-2.5 (3) MG/3ML Soln Commonly known as:  DUONEB Take 3 mLs by nebulization every 8 (eight) hours.   LUMIGAN 0.01 % Soln Generic drug:  bimatoprost Place 1 drop into both eyes at bedtime.   ondansetron 4 MG tablet Commonly known as:  ZOFRAN Take 4 mg by mouth every 8 (eight) hours as needed for nausea or vomiting.   simvastatin 40 MG tablet Commonly known as:  ZOCOR Take 1 tablet (40 mg total) by mouth at bedtime.       Time coordinating discharge: 39 minutes  The results of significant diagnostics from this hospitalization (including imaging, microbiology, ancillary and laboratory) are listed below for reference.    Procedures and Diagnostic Studies:   Dg Chest 2 View  Result  Date: 06/27/2018 CLINICAL DATA:  Cough.  Nausea. EXAM: CHEST - 2 VIEW COMPARISON:  05/30/2018 FINDINGS: A dual chamber pacemaker remains in place. The cardiac silhouette is mildly enlarged. The lungs are mildly hypoinflated. No airspace consolidation, edema, pleural effusion, pneumothorax is identified. No acute osseous abnormality is seen. IMPRESSION: No active cardiopulmonary disease. Electronically Signed   By: Logan Bores M.D.   On: 06/27/2018 16:19   Dg Abd Acute 2+v W 1v Chest  Result Date: 06/27/2018 CLINICAL DATA:  Dysphagia, constipation EXAM: DG ABDOMEN ACUTE W/ 1V CHEST COMPARISON:  06/19/2018 FINDINGS: There is no evidence of dilated bowel loops or free intraperitoneal air. No radiopaque calculi or other significant radiographic abnormality is seen. Heart size and mediastinal contours are within normal limits. Both lungs are clear. Dual lead cardiac pacemaker. IMPRESSION: Negative abdominal radiographs.  No acute cardiopulmonary disease. Electronically Signed   By: Elbert Ewings  Patel   On: 06/27/2018 18:21   Dg Esophagus W Single Cm (sol Or Thin Ba)  Result Date: 06/28/2018 CLINICAL DATA:  Dysphagia EXAM: ESOPHOGRAM/BARIUM SWALLOW TECHNIQUE: Combined double contrast and single contrast examination performed using effervescent crystals, thick barium liquid, and thin barium liquid. FLUOROSCOPY TIME:  Fluoroscopy Time:  1 minutes and 42 seconds Radiation Exposure Index (if provided by the fluoroscopic device): 58.1 Number of Acquired Spot Images: 0 COMPARISON:  None. FINDINGS: Double-contrast images of the esophagus demonstrate no intraluminal filling defect or focal stricture. Mucosa is within normal limits. Single contrast images demonstrate peristalsis throughout the esophagus. There is no stricture or focal mass. Minimal esophageal spasm distally is noted. A small hiatal hernia is present. The gastroesophageal junction is widely patent. IMPRESSION: Esophagram demonstrates no focal stricture or  intraluminal mass. Minimal esophageal spasm and a small hiatal hernia are noted. Electronically Signed   By: Marybelle Killings M.D.   On: 06/28/2018 12:29     Labs:   Basic Metabolic Panel: Recent Labs  Lab 06/27/18 1538 06/28/18 0535 06/28/18 1608 06/29/18 0849  NA 140 140 142 139  K 2.4* 2.4* 2.9* 4.1  CL 97* 102 104 104  CO2 31 28 30 29   GLUCOSE 109* 122* 126* 127*  BUN 26* 20 15 14   CREATININE 1.50* 1.16* 1.15* 0.86  CALCIUM 8.6* 8.1* 8.2* 8.4*  MG  --  1.6*  --  1.8   GFR Estimated Creatinine Clearance: 51.4 mL/min (by C-G formula based on SCr of 0.86 mg/dL). Liver Function Tests: Recent Labs  Lab 06/27/18 1538 06/28/18 0535  AST 19 20  ALT 13 12  ALKPHOS 56 51  BILITOT 1.1 0.8  PROT 7.8 6.5  ALBUMIN 3.5 3.0*   No results for input(s): LIPASE, AMYLASE in the last 168 hours. No results for input(s): AMMONIA in the last 168 hours. Coagulation profile No results for input(s): INR, PROTIME in the last 168 hours.  CBC: Recent Labs  Lab 06/27/18 1538 06/28/18 0535  WBC 6.2 5.5  NEUTROABS 3.9  --   HGB 11.2* 10.5*  HCT 38.1 35.1*  MCV 89.6 90.2  PLT 298 249   Cardiac Enzymes: Recent Labs  Lab 06/27/18 1538  CKTOTAL 66   BNP: Invalid input(s): POCBNP CBG: No results for input(s): GLUCAP in the last 168 hours. D-Dimer No results for input(s): DDIMER in the last 72 hours. Hgb A1c No results for input(s): HGBA1C in the last 72 hours. Lipid Profile No results for input(s): CHOL, HDL, LDLCALC, TRIG, CHOLHDL, LDLDIRECT in the last 72 hours. Thyroid function studies No results for input(s): TSH, T4TOTAL, T3FREE, THYROIDAB in the last 72 hours.  Invalid input(s): FREET3 Anemia work up No results for input(s): VITAMINB12, FOLATE, FERRITIN, TIBC, IRON, RETICCTPCT in the last 72 hours. Microbiology No results found for this or any previous visit (from the past 240 hour(s)).  Signed: Marlowe Aschoff Arcenia Scarbro  [facility] 06/29/2018, 11:43 AM

## 2018-06-29 NOTE — Progress Notes (Signed)
D/C instructions reviewed w/ pt and granddaughter. All questions answered, no further questions. Pt and granddaughter verbalize understanding. Pt d/c in w/c by this writer to granddaughter's car in stable condition. Pt in possession of d/c packet and all personal belongings.

## 2018-06-29 NOTE — Plan of Care (Signed)
  Problem: Clinical Measurements: Goal: Ability to maintain clinical measurements within normal limits will improve Outcome: Progressing Goal: Respiratory complications will improve Outcome: Progressing   

## 2018-07-07 ENCOUNTER — Telehealth: Payer: Self-pay | Admitting: Neurology

## 2018-07-07 NOTE — Telephone Encounter (Signed)
Noted  

## 2018-07-07 NOTE — Telephone Encounter (Signed)
I rec'd a call today from Tito Dine (not on pt's DPR) asking about who made the referral for the patient. She was advised she was not on the [facility] and could not give that information. I told her Shalawn Wynder and Jozi Malachi could call and that information could be given to them. She became rude and kept repeating: that DPR is illegal over and over again. she then said to cancel the appt. I have not cancelled it bc it was made by the nursing facility where the patient was.  I don't know if the patient is still there.   [id number] had also spoken previously to Grayland Jack inquiring of the same information. She advised [name] someone on the [facility] would have to call back.   FYI

## 2018-07-09 ENCOUNTER — Telehealth: Payer: Self-pay | Admitting: Neurology

## 2018-07-09 ENCOUNTER — Other Ambulatory Visit: Payer: Self-pay

## 2018-07-09 ENCOUNTER — Ambulatory Visit (INDEPENDENT_AMBULATORY_CARE_PROVIDER_SITE_OTHER): Payer: Medicare Other | Admitting: *Deleted

## 2018-07-09 DIAGNOSIS — I442 Atrioventricular block, complete: Secondary | ICD-10-CM

## 2018-07-09 DIAGNOSIS — I495 Sick sinus syndrome: Secondary | ICD-10-CM

## 2018-07-09 NOTE — Telephone Encounter (Addendum)
Pt's niece Ashely Nyoka Cowden called not on current DPR and claims to be pt's POA stating the pt did not want to keep scheduled appt for tomorrow with Dr. Jannifer Franklin. I did not provide any medical information but stated if the pt did not want to keep the appt we could cancel. Appointment with Dr. Jannifer Franklin for 07/10/18 has been canceled.   Ashely states she will send POA paper work to be scanned.

## 2018-07-09 NOTE — Telephone Encounter (Signed)
Patient daughter has requested for RN to call her to discuss what appt for 3/12 is for

## 2018-07-10 ENCOUNTER — Ambulatory Visit: Payer: Medicare Other | Admitting: Neurology

## 2018-07-10 LAB — CUP PACEART REMOTE DEVICE CHECK
Battery Remaining Longevity: 67 mo
Battery Voltage: 2.78 V
Brady Statistic AP VP Percent: 19 %
Brady Statistic AP VS Percent: 0 %
Brady Statistic AS VP Percent: 81 %
Brady Statistic AS VS Percent: 0 %
Date Time Interrogation Session: 20200311143421
Implantable Lead Implant Date: 20060525
Implantable Lead Implant Date: 20060525
Implantable Lead Location: 753860
Implantable Lead Model: 5076
Implantable Pulse Generator Implant Date: 20150402
Lead Channel Impedance Value: 434 Ohm
Lead Channel Impedance Value: 451 Ohm
Lead Channel Pacing Threshold Amplitude: 0.625 V
Lead Channel Pacing Threshold Amplitude: 1.625 V
Lead Channel Pacing Threshold Pulse Width: 0.4 ms
Lead Channel Pacing Threshold Pulse Width: 0.4 ms
Lead Channel Setting Pacing Amplitude: 3.25 V
Lead Channel Setting Pacing Pulse Width: 0.4 ms
Lead Channel Setting Sensing Sensitivity: 2 mV
MDC IDC LEAD LOCATION: 753859
MDC IDC MSMT BATTERY IMPEDANCE: 529 Ohm
MDC IDC SET LEADCHNL RA PACING AMPLITUDE: 2 V

## 2018-07-13 ENCOUNTER — Encounter (HOSPITAL_COMMUNITY): Payer: Self-pay

## 2018-07-13 ENCOUNTER — Emergency Department (HOSPITAL_COMMUNITY): Payer: Medicare Other

## 2018-07-13 ENCOUNTER — Other Ambulatory Visit: Payer: Self-pay

## 2018-07-13 ENCOUNTER — Inpatient Hospital Stay (HOSPITAL_COMMUNITY)
Admission: EM | Admit: 2018-07-13 | Discharge: 2018-07-24 | DRG: 372 | Disposition: A | Discharge status: Home Health | Payer: Medicare Other | Attending: Internal Medicine | Admitting: Internal Medicine

## 2018-07-13 DIAGNOSIS — E876 Hypokalemia: Secondary | ICD-10-CM | POA: Diagnosis not present

## 2018-07-13 DIAGNOSIS — R05 Cough: Secondary | ICD-10-CM

## 2018-07-13 DIAGNOSIS — K2210 Ulcer of esophagus without bleeding: Secondary | ICD-10-CM | POA: Diagnosis present

## 2018-07-13 DIAGNOSIS — I10 Essential (primary) hypertension: Secondary | ICD-10-CM | POA: Diagnosis present

## 2018-07-13 DIAGNOSIS — R790 Abnormal level of blood mineral: Secondary | ICD-10-CM | POA: Diagnosis present

## 2018-07-13 DIAGNOSIS — E440 Moderate protein-calorie malnutrition: Secondary | ICD-10-CM

## 2018-07-13 DIAGNOSIS — Z7982 Long term (current) use of aspirin: Secondary | ICD-10-CM

## 2018-07-13 DIAGNOSIS — K449 Diaphragmatic hernia without obstruction or gangrene: Secondary | ICD-10-CM | POA: Diagnosis present

## 2018-07-13 DIAGNOSIS — N2889 Other specified disorders of kidney and ureter: Secondary | ICD-10-CM | POA: Diagnosis present

## 2018-07-13 DIAGNOSIS — I119 Hypertensive heart disease without heart failure: Secondary | ICD-10-CM | POA: Diagnosis present

## 2018-07-13 DIAGNOSIS — D649 Anemia, unspecified: Secondary | ICD-10-CM | POA: Diagnosis present

## 2018-07-13 DIAGNOSIS — N179 Acute kidney failure, unspecified: Secondary | ICD-10-CM | POA: Diagnosis not present

## 2018-07-13 DIAGNOSIS — R509 Fever, unspecified: Secondary | ICD-10-CM | POA: Diagnosis present

## 2018-07-13 DIAGNOSIS — I129 Hypertensive chronic kidney disease with stage 1 through stage 4 chronic kidney disease, or unspecified chronic kidney disease: Secondary | ICD-10-CM | POA: Diagnosis present

## 2018-07-13 DIAGNOSIS — Z6834 Body mass index (BMI) 34.0-34.9, adult: Secondary | ICD-10-CM

## 2018-07-13 DIAGNOSIS — R54 Age-related physical debility: Secondary | ICD-10-CM | POA: Diagnosis present

## 2018-07-13 DIAGNOSIS — R400 Somnolence: Secondary | ICD-10-CM | POA: Diagnosis present

## 2018-07-13 DIAGNOSIS — R1110 Vomiting, unspecified: Secondary | ICD-10-CM

## 2018-07-13 DIAGNOSIS — R112 Nausea with vomiting, unspecified: Secondary | ICD-10-CM | POA: Diagnosis not present

## 2018-07-13 DIAGNOSIS — F502 Bulimia nervosa: Secondary | ICD-10-CM

## 2018-07-13 DIAGNOSIS — Z950 Presence of cardiac pacemaker: Secondary | ICD-10-CM | POA: Diagnosis present

## 2018-07-13 DIAGNOSIS — Z7902 Long term (current) use of antithrombotics/antiplatelets: Secondary | ICD-10-CM

## 2018-07-13 DIAGNOSIS — R059 Cough, unspecified: Secondary | ICD-10-CM

## 2018-07-13 DIAGNOSIS — A0472 Enterocolitis due to Clostridium difficile, not specified as recurrent: Secondary | ICD-10-CM | POA: Diagnosis not present

## 2018-07-13 DIAGNOSIS — R634 Abnormal weight loss: Secondary | ICD-10-CM

## 2018-07-13 DIAGNOSIS — Z87891 Personal history of nicotine dependence: Secondary | ICD-10-CM

## 2018-07-13 DIAGNOSIS — I442 Atrioventricular block, complete: Secondary | ICD-10-CM | POA: Diagnosis present

## 2018-07-13 DIAGNOSIS — K567 Ileus, unspecified: Secondary | ICD-10-CM | POA: Diagnosis not present

## 2018-07-13 DIAGNOSIS — E7800 Pure hypercholesterolemia, unspecified: Secondary | ICD-10-CM

## 2018-07-13 DIAGNOSIS — N183 Chronic kidney disease, stage 3 (moderate): Secondary | ICD-10-CM | POA: Diagnosis present

## 2018-07-13 DIAGNOSIS — Z6835 Body mass index (BMI) 35.0-35.9, adult: Secondary | ICD-10-CM

## 2018-07-13 DIAGNOSIS — R627 Adult failure to thrive: Secondary | ICD-10-CM | POA: Diagnosis present

## 2018-07-13 DIAGNOSIS — Z8673 Personal history of transient ischemic attack (TIA), and cerebral infarction without residual deficits: Secondary | ICD-10-CM

## 2018-07-13 DIAGNOSIS — E785 Hyperlipidemia, unspecified: Secondary | ICD-10-CM | POA: Diagnosis present

## 2018-07-13 DIAGNOSIS — Z8249 Family history of ischemic heart disease and other diseases of the circulatory system: Secondary | ICD-10-CM

## 2018-07-13 HISTORY — DX: Hypokalemia: E87.6

## 2018-07-13 HISTORY — DX: Vomiting, unspecified: R11.10

## 2018-07-13 HISTORY — DX: Abnormal level of blood mineral: R79.0

## 2018-07-13 LAB — COMPREHENSIVE METABOLIC PANEL
ALBUMIN: 2.7 g/dL — AB (ref 3.5–5.0)
ALT: 8 U/L (ref 0–44)
ALT: 8 U/L (ref 0–44)
AST: 13 U/L — ABNORMAL LOW (ref 15–41)
AST: 12 U/L — ABNORMAL LOW (ref 15–41)
Albumin: 2.5 g/dL — ABNORMAL LOW (ref 3.5–5.0)
Alkaline Phosphatase: 57 U/L (ref 38–126)
Alkaline Phosphatase: 51 U/L (ref 38–126)
Anion gap: 8 (ref 5–15)
Anion gap: 7 (ref 5–15)
BUN: 11 mg/dL (ref 8–23)
BUN: 10 mg/dL (ref 8–23)
CHLORIDE: 108 mmol/L (ref 98–111)
CO2: 25 mmol/L (ref 22–32)
CO2: 22 mmol/L (ref 22–32)
Calcium: 7.9 mg/dL — ABNORMAL LOW (ref 8.9–10.3)
Calcium: 7.4 mg/dL — ABNORMAL LOW (ref 8.9–10.3)
Chloride: 112 mmol/L — ABNORMAL HIGH (ref 98–111)
Creatinine, Ser: 0.75 mg/dL (ref 0.44–1.00)
Creatinine, Ser: 0.74 mg/dL (ref 0.44–1.00)
GFR calc Af Amer: >60 mL/min (ref >60)
GFR calc Af Amer: >60 mL/min (ref >60)
GFR calc non Af Amer: >60 mL/min (ref >60)
GLUCOSE: 89 mg/dL (ref 70–99)
Glucose, Bld: 96 mg/dL (ref 70–99)
POTASSIUM: 3.5 mmol/L (ref 3.5–5.1)
Potassium: 2.8 mmol/L — ABNORMAL LOW (ref 3.5–5.1)
Sodium: 141 mmol/L (ref 135–145)
Sodium: 141 mmol/L (ref 135–145)
Total Bilirubin: 0.8 mg/dL (ref 0.3–1.2)
Total Bilirubin: 0.9 mg/dL (ref 0.3–1.2)
Total Protein: 6.6 g/dL (ref 6.5–8.1)
Total Protein: 6.1 g/dL — ABNORMAL LOW (ref 6.5–8.1)

## 2018-07-13 LAB — LACTIC ACID, PLASMA: Lactic Acid, Venous: 0.9 mmol/L (ref 0.5–1.9)

## 2018-07-13 LAB — URINALYSIS, ROUTINE W REFLEX MICROSCOPIC
BACTERIA UA: NONE SEEN
BILIRUBIN URINE: NEGATIVE
Glucose, UA: NEGATIVE mg/dL
Ketones, ur: 5 mg/dL — AB
Leukocytes,Ua: NEGATIVE
Nitrite: NEGATIVE
Protein, ur: 30 mg/dL — AB
Specific Gravity, Urine: 1.017 (ref 1.005–1.030)
pH: 5 (ref 5.0–8.0)

## 2018-07-13 LAB — CBC WITH DIFFERENTIAL/PLATELET
ABS IMMATURE GRANULOCYTES: 0.09 10*3/uL — AB (ref 0.00–0.07)
Basophils Absolute: 0 10*3/uL (ref 0.0–0.1)
Basophils Relative: 0 %
Eosinophils Absolute: 0 10*3/uL (ref 0.0–0.5)
Eosinophils Relative: 0 %
HCT: 36 % (ref 36.0–46.0)
Hemoglobin: 10.7 g/dL — ABNORMAL LOW (ref 12.0–15.0)
Immature Granulocytes: 1 %
Lymphocytes Relative: 16 %
Lymphs Abs: 1.4 10*3/uL (ref 0.7–4.0)
MCH: 26.9 pg (ref 26.0–34.0)
MCHC: 29.7 g/dL — ABNORMAL LOW (ref 30.0–36.0)
MCV: 90.5 fL (ref 80.0–100.0)
Monocytes Absolute: 0.6 10*3/uL (ref 0.1–1.0)
Monocytes Relative: 7 %
NEUTROS ABS: 6.6 10*3/uL (ref 1.7–7.7)
NEUTROS PCT: 76 %
Platelets: 266 10*3/uL (ref 150–400)
RBC: 3.98 MIL/uL (ref 3.87–5.11)
RDW: 15.9 % — ABNORMAL HIGH (ref 11.5–15.5)
WBC: 8.7 10*3/uL (ref 4.0–10.5)
nRBC: 0 % (ref 0.0–0.2)

## 2018-07-13 LAB — INFLUENZA PANEL BY PCR (TYPE A & B)
Influenza A By PCR: NEGATIVE
Influenza B By PCR: NEGATIVE

## 2018-07-13 LAB — TSH: TSH: 0.716 u[IU]/mL (ref 0.350–4.500)

## 2018-07-13 LAB — MAGNESIUM: Magnesium: 1.4 mg/dL — ABNORMAL LOW (ref 1.7–2.4)

## 2018-07-13 MED ORDER — POTASSIUM CHLORIDE 10 MEQ/100ML IV SOLN
10.0000 meq | INTRAVENOUS | Status: AC
  Administered 2018-07-13 (×2): 10 meq via INTRAVENOUS
  Filled 2018-07-13 (×2): qty 100

## 2018-07-13 MED ORDER — SODIUM CHLORIDE 0.9 % IV SOLN
INTRAVENOUS | Status: DC
  Administered 2018-07-13 – 2018-07-14 (×2): via INTRAVENOUS

## 2018-07-13 MED ORDER — ACETAMINOPHEN 325 MG PO TABS
650.0000 mg | ORAL_TABLET | Freq: Once | ORAL | Status: AC | PRN
  Administered 2018-07-13: 650 mg via ORAL
  Filled 2018-07-13: qty 2

## 2018-07-13 MED ORDER — TRAZODONE HCL 50 MG PO TABS
25.0000 mg | ORAL_TABLET | Freq: Every evening | ORAL | Status: DC | PRN
  Administered 2018-07-21: 25 mg via ORAL
  Filled 2018-07-13 (×2): qty 1

## 2018-07-13 MED ORDER — ACETAMINOPHEN 325 MG PO TABS
650.0000 mg | ORAL_TABLET | Freq: Once | ORAL | Status: DC
  Filled 2018-07-13 (×2): qty 2

## 2018-07-13 MED ORDER — POTASSIUM CHLORIDE CRYS ER 20 MEQ PO TBCR
40.0000 meq | EXTENDED_RELEASE_TABLET | Freq: Once | ORAL | Status: AC
  Administered 2018-07-13: 40 meq via ORAL
  Filled 2018-07-13: qty 2

## 2018-07-13 MED ORDER — ASPIRIN EC 81 MG PO TBEC
81.0000 mg | DELAYED_RELEASE_TABLET | Freq: Every day | ORAL | Status: DC
  Administered 2018-07-13 – 2018-07-24 (×11): 81 mg via ORAL
  Filled 2018-07-13 (×11): qty 1

## 2018-07-13 MED ORDER — SODIUM CHLORIDE 0.9 % IV SOLN
INTRAVENOUS | Status: DC
  Administered 2018-07-13 – 2018-07-15 (×3): via INTRAVENOUS

## 2018-07-13 MED ORDER — SODIUM CHLORIDE 0.9 % IV BOLUS
500.0000 mL | Freq: Once | INTRAVENOUS | Status: AC
  Administered 2018-07-13: 500 mL via INTRAVENOUS

## 2018-07-13 NOTE — ED Notes (Signed)
NT reported pt was dry heaving after eating crackers, but no observed emesis. Provider notified.

## 2018-07-13 NOTE — ED Notes (Signed)
Pt's bladder scan was 142mL

## 2018-07-13 NOTE — ED Provider Notes (Signed)
[facility] DEPT Provider Note   CSN: [id number] Arrival date & time: 07/13/18  1309    History   Chief Complaint Chief Complaint  Patient presents with  . Anorexia    HPI [name] is a 85 y.o. female.     Patient is a 85 year old female with a history of hypertension, hyperlipidemia, prior stroke, pacemaker placement who presents with nausea and vomiting.  She states she has not had a good appetite in the last 3 to 4 days.  She has had some nausea and vomiting whenever she tries to eat.  She also started coughing up some yellow sputum.  She has no shortness of breath.  No chest pain.  No known fevers although she was found to have a temperature of 102.3 on arrival.  She has had some achiness and fatigue.  No urinary symptoms.  No abdominal pain.  No lesions or sores anywhere.  She had some loose stools yesterday but none today.  No recent travel history or no known sick contacts.  She was recently in a rehab center after a hospitalization for hypokalemia.     Past Medical History:  Diagnosis Date  . Complete heart block ([place])   . Gait abnormality 07/23/2016  . History of hypertension   . History of stroke   . Hyperhomocysteinemia ([place])   . Hyperlipidemia   . Hypertension   . Hypokalemia   . Pacemaker-Medtronic 08/07/2011    Patient Active Problem List   Diagnosis Date Noted  . Weight loss 06/27/2018  . Rhabdomyolysis 05/26/2018  . History of CVA (cerebrovascular accident) 05/26/2018  . Essential hypertension 05/26/2018  . Hyperlipidemia 05/26/2018  . AKI (acute kidney injury) ([place]) 05/26/2018  . Transaminitis 05/26/2018  . Elevated troponin 05/26/2018  . Gait abnormality 07/23/2016  . Pacemaker-Medtronic 08/07/2011  . Atrial fibrillation ([place]) 08/07/2011  . Daytime somnolence 08/07/2011  . HYPERTENSION, HEART CONTROLLED W/O ASSOC CHF 06/07/2010  . AV BLOCK, COMPLETE 06/07/2010  . Cerebral artery occlusion with cerebral  infarction ([place]) 06/07/2010    Past Surgical History:  Procedure Laterality Date  . CARDIAC CATHETERIZATION    . PACEMAKER INSERTION     Medtronic Enpulse dual-chamber pacemaker  . PERMANENT PACEMAKER GENERATOR CHANGE N/A 07/30/2013   Procedure: PERMANENT PACEMAKER GENERATOR CHANGE;  Surgeon: [name], MD;  Location: [facility] CATH LAB;  Service: Cardiovascular;  Laterality: N/A;     OB History   No obstetric history on file.      Home Medications    Prior to Admission medications   Medication Sig Start Date End Date Taking? Authorizing Provider  acetaminophen (TYLENOL) 500 MG tablet Take 500-1,000 mg by mouth every 6 (six) hours as needed for moderate pain.     [provider]  albuterol (PROVENTIL) (2.5 MG/3ML) 0.083% nebulizer solution Take 3 mLs (2.5 mg total) by nebulization every 2 (two) hours as needed for wheezing. Patient not taking: Reported on 06/27/2018 05/31/18   Jonetta Osgood, MD  aspirin 81 MG tablet Take 81 mg by mouth daily.    [provider]  Blood Pressure KIT Automated blood pressure measuring device. 06/29/18   [name], MD  clopidogrel (PLAVIX) 75 MG tablet Take 75 mg by mouth daily with breakfast.    [provider]  guaiFENesin (MUCINEX) 600 MG 12 hr tablet Take 1 tablet (600 mg total) by mouth 2 (two) times daily. Patient not taking: Reported on 06/27/2018 05/31/18   Jonetta Osgood, MD  ipratropium-albuterol (DUONEB) 0.5-2.5 (  3) MG/3ML SOLN Take 3 mLs by nebulization every 8 (eight) hours. Patient not taking: Reported on 06/27/2018 05/31/18   Jonetta Osgood, MD  LUMIGAN 0.01 % SOLN Place 1 drop into both eyes at bedtime. 01/13/18   [provider]  ondansetron (ZOFRAN) 4 MG tablet Take 4 mg by mouth every 8 (eight) hours as needed for nausea or vomiting.  06/26/18   [provider]  simvastatin (ZOCOR) 40 MG tablet Take 1 tablet (40 mg total) by mouth at bedtime. 10/25/17   [name], MD    Family  History Family History  Problem Relation Age of Onset  . Hypertension Mother     Social History Social History   Tobacco Use  . Smoking status: Former Smoker    Last attempt to quit: 07/25/2006    Years since quitting: 11.9  . Smokeless tobacco: Never Used  Substance Use Topics  . Alcohol use: No  . Drug use: No     Allergies   Patient has no known allergies.   Review of Systems Review of Systems  Constitutional: Positive for fatigue and fever. Negative for chills and diaphoresis.  HENT: Negative for congestion, rhinorrhea and sneezing.   Eyes: Negative.   Respiratory: Positive for cough. Negative for chest tightness and shortness of breath.   Cardiovascular: Negative for chest pain and leg swelling.  Gastrointestinal: Positive for nausea and vomiting. Negative for abdominal pain, blood in stool and diarrhea.  Genitourinary: Negative for difficulty urinating, flank pain, frequency and hematuria.  Musculoskeletal: Positive for myalgias. Negative for arthralgias and back pain.  Skin: Negative for rash.  Neurological: Negative for dizziness, speech difficulty, weakness, numbness and headaches.     Physical Exam Updated Vital Signs BP 124/62 (BP Location: Left Arm)   Pulse 82   Temp 98.8 F (37.1 C) (Oral)   Resp (!) 24   SpO2 94%   Physical Exam Constitutional:      Appearance: She is well-developed.  HENT:     Head: Normocephalic and atraumatic.  Eyes:     Pupils: Pupils are equal, round, and reactive to light.  Neck:     Musculoskeletal: Normal range of motion and neck supple.  Cardiovascular:     Rate and Rhythm: Normal rate and regular rhythm.     Heart sounds: Normal heart sounds.  Pulmonary:     Effort: Pulmonary effort is normal. No respiratory distress.     Breath sounds: Normal breath sounds. No wheezing or rales.  Chest:     Chest wall: No tenderness.  Abdominal:     General: Bowel sounds are normal.     Palpations: Abdomen is soft.      Tenderness: There is no abdominal tenderness. There is no guarding or rebound.  Musculoskeletal: Normal range of motion.  Lymphadenopathy:     Cervical: No cervical adenopathy.  Skin:    General: Skin is warm and dry.     Findings: No rash.  Neurological:     Mental Status: She is alert and oriented to person, place, and time.      ED Treatments / Results  Labs (all labs ordered are listed, but only abnormal results are displayed) Labs Reviewed  CBC WITH DIFFERENTIAL/PLATELET - Abnormal; Notable for the following components:      Result Value   Hemoglobin 10.7 (*)    MCHC 29.7 (*)    RDW 15.9 (*)    Abs Immature Granulocytes 0.09 (*)    All other components within normal  limits  COMPREHENSIVE METABOLIC PANEL - Abnormal; Notable for the following components:   Potassium 2.8 (*)    Calcium 7.9 (*)    Albumin 2.7 (*)    AST 13 (*)    All other components within normal limits  URINALYSIS, ROUTINE W REFLEX MICROSCOPIC - Abnormal; Notable for the following components:   APPearance HAZY (*)    Hgb urine dipstick SMALL (*)    Ketones, ur 5 (*)    Protein, ur 30 (*)    All other components within normal limits  MAGNESIUM - Abnormal; Notable for the following components:   Magnesium 1.4 (*)    All other components within normal limits  URINE CULTURE  LACTIC ACID, PLASMA  INFLUENZA PANEL BY PCR (TYPE A & B)  LACTIC ACID, PLASMA  COMPREHENSIVE METABOLIC PANEL    EKG EKG Interpretation  Date/Time:  Sunday July 13 2018 15:26:39 EDT Ventricular Rate:  88 PR Interval:    QRS Duration: 127 QT Interval:  387 QTC Calculation: 469 R Axis:   -25 Text Interpretation:  Ventricular-paced complexes No further analysis attempted due to paced rhythm Confirmed by Malvin Johns [phone]) on 07/13/2018 3:59:56 PM   Radiology Dg Chest 2 View  Result Date: 07/13/2018 CLINICAL DATA:  Decreased appetite and vomiting. Cough. Hypertension. EXAM: CHEST - 2 VIEW COMPARISON:  June 27, 2018  FINDINGS: There is no appreciable edema or consolidation. Heart is mildly enlarged with pulmonary vascularity normal. Pacemaker leads are attached to the right atrium and right ventricle. No adenopathy. There is degenerative change in the thoracic spine. There is anterior wedging of a midthoracic vertebral body, stable. IMPRESSION: Mild cardiomegaly. No edema or consolidation. Pacemaker leads attached to right atrium and right ventricle. Electronically Signed   By: Lowella Grip III M.D.   On: 07/13/2018 15:47    Procedures Procedures (including critical care time)  Medications Ordered in ED Medications  0.9 %  sodium chloride infusion ( Intravenous New Bag/Given 07/13/18 1616)  acetaminophen (TYLENOL) tablet 650 mg (has no administration in time range)  acetaminophen (TYLENOL) tablet 650 mg (650 mg Oral Given 07/13/18 1356)  sodium chloride 0.9 % bolus 500 mL (0 mLs Intravenous Stopped 07/13/18 1956)  potassium chloride SA (K-DUR,KLOR-CON) CR tablet 40 mEq (40 mEq Oral Given 07/13/18 1805)  potassium chloride 10 mEq in 100 mL IVPB (0 mEq Intravenous Stopped 07/13/18 2046)     Initial Impression / Assessment and Plan / ED Course  I have reviewed the triage vital signs and the nursing notes.  Pertinent labs & imaging results that were available during my care of the patient were reviewed by me and considered in my medical decision making (see chart for details).        Patient is a 85 year old female who presents with fatigue and vomiting.  She also was noted to have a fever.  She has had a cough but there is no evidence of pneumonia on x-ray.  Her influenza test is negative.  Her abdomen is nontender.  She is noted to have marked hypokalemia and was given potassium replacement in the ED.  She recently had a similar episode of hypokalemia which was felt to be related to Lasix use although she has subsequently stopped her Lasix.  Her urine does not appear to be infected.  She did have ongoing  dry heaves after oral trial was initiated.  I will consult the hospitalist for admission.  I spoke with Dr. Sherral Hammers who will admit the pt.  Final Clinical Impressions(s) /  ED Diagnoses   Final diagnoses:  Cough  Hypokalemia  Intractable vomiting with nausea, unspecified vomiting type    ED Discharge Orders    None       Malvin Johns, MD 07/13/18 2111

## 2018-07-13 NOTE — ED Notes (Signed)
Pt given water and crackers for PO challenge

## 2018-07-13 NOTE — ED Notes (Signed)
Pt is BR attempting a urine sample. If pt is unable to void, pt agreed to in and out cath.

## 2018-07-13 NOTE — H&P (Signed)
[facility] History and Physical  [name] TDS:[id number] DOB: [birth year] DOA: 07/13/2018   PCP: Randel Books, FNP   Chief Complaint: Difficulty swallowing solid food  HPI: [name] is a 85 y.o. BF PMHx complete heart block (s/p Medtronic Pacer 07/30/2013), HTN, HLD, CVA.  CKD stage III  Recent admission for rhabdomyolysis and acute on chronic kidney disease stage III. Patient presents to the hospital with complaints of nausea as well as episode of vomiting along with difficulty swallowing solid food. Patient was recently hospitalized for rhabdomyolysis with acute on chronic kidney injury.  Discharge to SNF and from SNF patient was discharged back home on Monday. Patient was given aggressive IV hydration. On discharge patient was volume loaded and therefore was started on Lasix. Patient was taking Lasix up until yesterday on 06/26/2018 when she saw her PCP. Patient reports that she has lost 30 pounds since her recent hospitalization. No diarrhea reported. Has some mild abdominal discomfort. No acid reflux. Currently no nausea at the time of my evaluation but had an episode of vomiting yesterday when trying to eat solid food. She is able to swallow liquids as well as pills okay. No fever no chills.  No cough.  No rash anywhere.    Review of Systems:  Constitutional:  No weight loss, night sweats, Fevers, chills, fatigue.  HEENT:  No headaches, Difficulty swallowing,Tooth/dental problems,Sore throat,  No sneezing, itching, ear ache, nasal congestion, post nasal drip,  Cardio-vascular:  No chest pain, Orthopnea, PND, swelling in lower extremities, anasarca, dizziness, palpitations  GI:  No heartburn, indigestion, abdominal pain, positive nausea, vomiting, negative diarrhea, change in bowel habits, loss of appetite  Resp:  No shortness of breath with exertion or at rest. No excess mucus, no productive cough, No non-productive cough, No coughing up of  blood.No change in color of mucus.No wheezing.No chest wall deformity  Skin:  no rash or lesions.  GU:  no dysuria, change in color of urine, no urgency or frequency. No flank pain.  Musculoskeletal:  No joint pain or swelling. No decreased range of motion. No back pain.  Psych:  No change in mood or affect. No depression or anxiety. No memory loss.   Past Medical History:  Diagnosis Date  . Complete heart block ([place])   . Gait abnormality 07/23/2016  . History of hypertension   . History of stroke   . Hyperhomocysteinemia ([place])   . Hyperlipidemia   . Hypertension   . Hypokalemia   . Hypokalemia 07/13/2018  . Low blood magnesium 07/13/2018  . Pacemaker-Medtronic 08/07/2011  . Vomiting 07/13/2018   Past Surgical History:  Procedure Laterality Date  . CARDIAC CATHETERIZATION    . PACEMAKER INSERTION     Medtronic Enpulse dual-chamber pacemaker  . PERMANENT PACEMAKER GENERATOR CHANGE N/A 07/30/2013   Procedure: PERMANENT PACEMAKER GENERATOR CHANGE;  Surgeon: [name], MD;  Location: [facility] CATH LAB;  Service: Cardiovascular;  Laterality: N/A;   Social History:  reports that she quit smoking about 11 years ago. She has never used smokeless tobacco. She reports that she does not drink alcohol or use drugs.  No Known Allergies  Family History  Problem Relation Age of Onset  . Hypertension Mother   Negative family history CAD, HTN, HLD, cancer.     Prior to Admission medications   Medication Sig Start Date End Date Taking? Authorizing Provider  acetaminophen (TYLENOL) 500 MG tablet Take 500-1,000 mg by mouth every 6 (six) hours as needed for moderate pain.  [provider]  albuterol (PROVENTIL) (2.5 MG/3ML) 0.083% nebulizer solution Take 3 mLs (2.5 mg total) by nebulization every 2 (two) hours as needed for wheezing. Patient not taking: Reported on 06/27/2018 05/31/18   Jonetta Osgood, MD  aspirin 81 MG tablet Take 81 mg by mouth daily.    [provider]   Blood Pressure KIT Automated blood pressure measuring device. 06/29/18   [name], MD  clopidogrel (PLAVIX) 75 MG tablet Take 75 mg by mouth daily with breakfast.    [provider]  guaiFENesin (MUCINEX) 600 MG 12 hr tablet Take 1 tablet (600 mg total) by mouth 2 (two) times daily. Patient not taking: Reported on 06/27/2018 05/31/18   Jonetta Osgood, MD  ipratropium-albuterol (DUONEB) 0.5-2.5 (3) MG/3ML SOLN Take 3 mLs by nebulization every 8 (eight) hours. Patient not taking: Reported on 06/27/2018 05/31/18   Jonetta Osgood, MD  LUMIGAN 0.01 % SOLN Place 1 drop into both eyes at bedtime. 01/13/18   [provider]  ondansetron (ZOFRAN) 4 MG tablet Take 4 mg by mouth every 8 (eight) hours as needed for nausea or vomiting.  06/26/18   [provider]  simvastatin (ZOCOR) 40 MG tablet Take 1 tablet (40 mg total) by mouth at bedtime. 10/25/17   [name], MD     Consultants:  None  Procedures/Significant Events:  3/15 HYW:VPXT cardiomegaly. No edema or consolidation. Pacemaker leads attached to right atrium and right ventricle. Abdominal x-ray pending     I have personally reviewed and interpreted all radiology studies and my findings are as above.   VENTILATOR SETTINGS: None   Cultures 3/15 influenza A/B negative   Antimicrobials: None   Devices None   LINES / TUBES:  None    Continuous Infusions: . sodium chloride 125 mL/hr at 07/13/18 1616    Physical Exam: Vitals:   07/13/18 1630 07/13/18 1700 07/13/18 1915 07/13/18 2030  BP: 124/77 121/70 132/69 124/62  Pulse: 85 99 60 82  Resp: 19 (!) 25 18 (!) 24  Temp:      TempSrc:      SpO2: 90% 91% 94% 94%    Wt Readings from Last 3 Encounters:  06/29/18 86.2 kg  05/31/18 98.5 kg  09/18/17 92.1 kg    General: No acute respiratory distress Eyes: negative scleral hemorrhage, negative anisocoria, negative icterus ENT: Negative Runny nose, negative gingival bleeding, Neck:   Negative scars, masses, torticollis, lymphadenopathy, JVD Lungs: Clear to auscultation bilaterally without wheezes or crackles Cardiovascular: Regular rate and rhythm without murmur gallop or rub normal S1 and S2 Abdomen: Morbid obesity, negative abdominal pain, positive distended, hypoactive soft, bowel sounds, no rebound, no ascites, no appreciable mass Extremities: No significant cyanosis, clubbing, or edema bilateral lower extremities Skin: Negative rashes, lesions, ulcers Psychiatric:  Negative depression, negative anxiety, negative fatigue, negative mania  Central nervous system:  Cranial nerves II through XII intact, tongue/uvula midline, all extremities muscle strength 5/5, sensation intact throughout, negative dysarthria, negative expressive aphasia, negative receptive aphasia.        Labs on Admission:  Basic Metabolic Panel: Recent Labs  Lab 07/13/18 1420 07/13/18 1630  NA 141  --   K 2.8*  --   CL 108  --   CO2 25  --   GLUCOSE 89  --   BUN 11  --   CREATININE 0.75  --   CALCIUM 7.9*  --   MG  --  1.4*   Liver Function Tests: Recent Labs  Lab 07/13/18 1420  AST 13*  ALT 8  ALKPHOS 57  BILITOT 0.8  PROT 6.6  ALBUMIN 2.7*   No results for input(s): LIPASE, AMYLASE in the last 168 hours. No results for input(s): AMMONIA in the last 168 hours. CBC: Recent Labs  Lab 07/13/18 1420  WBC 8.7  NEUTROABS 6.6  HGB 10.7*  HCT 36.0  MCV 90.5  PLT 266   Cardiac Enzymes: No results for input(s): CKTOTAL, CKMB, CKMBINDEX, TROPONINI in the last 168 hours.  BNP (last 3 results) No results for input(s): BNP in the last 8760 hours.  ProBNP (last 3 results) No results for input(s): PROBNP in the last 8760 hours.  CBG: No results for input(s): GLUCAP in the last 168 hours.  Radiological Exams on Admission: Dg Chest 2 View  Result Date: 07/13/2018 CLINICAL DATA:  Decreased appetite and vomiting. Cough. Hypertension. EXAM: CHEST - 2 VIEW COMPARISON:  June 27, 2018 FINDINGS: There is no appreciable edema or consolidation. Heart is mildly enlarged with pulmonary vascularity normal. Pacemaker leads are attached to the right atrium and right ventricle. No adenopathy. There is degenerative change in the thoracic spine. There is anterior wedging of a midthoracic vertebral body, stable. IMPRESSION: Mild cardiomegaly. No edema or consolidation. Pacemaker leads attached to right atrium and right ventricle. Electronically Signed   By: Lowella Grip III M.D.   On: 07/13/2018 15:47    EKG: Independently reviewed.  Ventricular pacing  Assessment/Plan Active Problems:   HYPERTENSION, HEART CONTROLLED W/O ASSOC CHF   AV BLOCK, COMPLETE   Pacemaker-Medtronic   Daytime somnolence   Essential hypertension   Hyperlipidemia   AKI (acute kidney injury) ([place])   Hypokalemia   Low blood magnesium   Vomiting   Nausea and vomiting - Patient presenting with new onset N/V although less likely will work patient up for ACS.  Given that she is a morbidly obese, older black female, there signs and symptoms present differently. - Hydrate gently normal saline 72m/hr - Zofran p.o./IV  HTN/ACS? -Currently controlled without medication monitor closely -Strict in and out -Daily weight  Complete heart block -See HTN - Patient with Medtronic pacer in place, EKG shows ventricular pacing.  HLD -Lipid panel pending -Zocor 40 mg daily if tolerated  Hypokalemia - Potassium goal> 4 - Potassium IV 40 mEq  Hypomagnesmia - Magnesium goal> 2 - Magnesium 2 g        Code Status: Full (DVT Prophylaxis: Heparin per pharmacy Family Communication: None Disposition Plan: TBD       Data Reviewed: Care during the described time interval was provided by me .  I have reviewed this patient's available data, including medical history, events of note, physical examination, and all test results as part of my evaluation.   Time spent: 60 min  [facility] Pager [phone]

## 2018-07-13 NOTE — ED Notes (Signed)
Pt ambulated to BR and back with front wheel walker. Tolerated well.

## 2018-07-13 NOTE — ED Notes (Signed)
Patient transported to X-ray 

## 2018-07-13 NOTE — ED Triage Notes (Signed)
She comes to [place] from home with c/o of "feeling kinds weak and I still don't have a appetite". [sic] She reports a mild cough, and reports no pain or discomfort. She is alert and oriented and is surprised to find she has a fever.

## 2018-07-13 NOTE — ED Notes (Signed)
Pt attempting to vomit when writer walked into room and said the crackers and water made her sick. Will notify, Thedore Mins, RN

## 2018-07-13 NOTE — ED Triage Notes (Signed)
EMS reports pt is from home. 3-5 days of decreased appetite and nausea, able to tol a little juice this morning. 142/80-94% RA-85-CBG 148. Pt alert and oriented.

## 2018-07-13 NOTE — ED Notes (Signed)
Attempted to obtain labs with no success.

## 2018-07-14 ENCOUNTER — Observation Stay (HOSPITAL_BASED_OUTPATIENT_CLINIC_OR_DEPARTMENT_OTHER): Payer: Medicare Other

## 2018-07-14 ENCOUNTER — Encounter (HOSPITAL_COMMUNITY): Payer: Self-pay | Admitting: *Deleted

## 2018-07-14 ENCOUNTER — Other Ambulatory Visit: Payer: Self-pay

## 2018-07-14 ENCOUNTER — Observation Stay (HOSPITAL_COMMUNITY): Payer: Medicare Other

## 2018-07-14 DIAGNOSIS — R05 Cough: Secondary | ICD-10-CM | POA: Diagnosis not present

## 2018-07-14 DIAGNOSIS — R634 Abnormal weight loss: Secondary | ICD-10-CM

## 2018-07-14 DIAGNOSIS — R059 Cough, unspecified: Secondary | ICD-10-CM

## 2018-07-14 DIAGNOSIS — I361 Nonrheumatic tricuspid (valve) insufficiency: Secondary | ICD-10-CM | POA: Diagnosis not present

## 2018-07-14 DIAGNOSIS — K567 Ileus, unspecified: Secondary | ICD-10-CM | POA: Diagnosis not present

## 2018-07-14 DIAGNOSIS — R112 Nausea with vomiting, unspecified: Secondary | ICD-10-CM | POA: Diagnosis not present

## 2018-07-14 DIAGNOSIS — E876 Hypokalemia: Secondary | ICD-10-CM | POA: Diagnosis not present

## 2018-07-14 LAB — PROTIME-INR
INR: 1.2 (ref 0.8–1.2)
Prothrombin Time: 15 seconds (ref 11.4–15.2)

## 2018-07-14 LAB — BASIC METABOLIC PANEL
Anion gap: 11 (ref 5–15)
BUN: 9 mg/dL (ref 8–23)
CO2: 19 mmol/L — ABNORMAL LOW (ref 22–32)
Calcium: 7.7 mg/dL — ABNORMAL LOW (ref 8.9–10.3)
Chloride: 113 mmol/L — ABNORMAL HIGH (ref 98–111)
Creatinine, Ser: 0.77 mg/dL (ref 0.44–1.00)
GFR calc non Af Amer: >60 mL/min (ref >60)
Glucose, Bld: 83 mg/dL (ref 70–99)
Potassium: 3.7 mmol/L (ref 3.5–5.1)
SODIUM: 143 mmol/L (ref 135–145)

## 2018-07-14 LAB — APTT: aPTT: 28 seconds (ref 24–36)

## 2018-07-14 LAB — ECHOCARDIOGRAM COMPLETE
Height: 62 in
WEIGHTICAEL: 3040 [oz_av]

## 2018-07-14 LAB — BRAIN NATRIURETIC PEPTIDE: B Natriuretic Peptide: 241.2 pg/mL — ABNORMAL HIGH (ref 0.0–100.0)

## 2018-07-14 LAB — LIPID PANEL
Cholesterol: 65 mg/dL (ref 0–200)
HDL: 23 mg/dL — ABNORMAL LOW (ref >40)
LDL Cholesterol: 27 mg/dL (ref 0–99)
Total CHOL/HDL Ratio: 2.8 RATIO
Triglycerides: 76 mg/dL (ref <150)
VLDL: 15 mg/dL (ref 0–40)

## 2018-07-14 LAB — CBC
HCT: 35.8 % — ABNORMAL LOW (ref 36.0–46.0)
Hemoglobin: 10.4 g/dL — ABNORMAL LOW (ref 12.0–15.0)
MCH: 27.4 pg (ref 26.0–34.0)
MCHC: 29.1 g/dL — ABNORMAL LOW (ref 30.0–36.0)
MCV: 94.5 fL (ref 80.0–100.0)
PLATELETS: 122 10*3/uL — AB (ref 150–400)
RBC: 3.79 MIL/uL — ABNORMAL LOW (ref 3.87–5.11)
RDW: 16.4 % — ABNORMAL HIGH (ref 11.5–15.5)
WBC: 8.7 10*3/uL (ref 4.0–10.5)
nRBC: 0 % (ref 0.0–0.2)

## 2018-07-14 LAB — URINE CULTURE

## 2018-07-14 LAB — TROPONIN I
Troponin I: 0.03 ng/mL (ref <0.03)
Troponin I: <0.03 ng/mL (ref <0.03)
Troponin I: <0.03 ng/mL (ref <0.03)

## 2018-07-14 LAB — GLUCOSE, CAPILLARY: GLUCOSE-CAPILLARY: 89 mg/dL (ref 70–99)

## 2018-07-14 LAB — MAGNESIUM: Magnesium: 2 mg/dL (ref 1.7–2.4)

## 2018-07-14 MED ORDER — ONDANSETRON HCL 4 MG PO TABS
4.0000 mg | ORAL_TABLET | Freq: Three times a day (TID) | ORAL | Status: DC | PRN
  Administered 2018-07-15 – 2018-07-18 (×3): 4 mg via ORAL
  Filled 2018-07-14 (×3): qty 1

## 2018-07-14 MED ORDER — ENOXAPARIN SODIUM 40 MG/0.4ML ~~LOC~~ SOLN
40.0000 mg | SUBCUTANEOUS | Status: DC
  Administered 2018-07-14 – 2018-07-23 (×10): 40 mg via SUBCUTANEOUS
  Filled 2018-07-14 (×10): qty 0.4

## 2018-07-14 MED ORDER — HEPARIN BOLUS VIA INFUSION
4000.0000 [IU] | Freq: Once | INTRAVENOUS | Status: AC
  Administered 2018-07-14: 4000 [IU] via INTRAVENOUS
  Filled 2018-07-14: qty 4000

## 2018-07-14 MED ORDER — CLOPIDOGREL BISULFATE 75 MG PO TABS
75.0000 mg | ORAL_TABLET | Freq: Every day | ORAL | Status: DC
  Administered 2018-07-15 – 2018-07-24 (×10): 75 mg via ORAL
  Filled 2018-07-14 (×11): qty 1

## 2018-07-14 MED ORDER — ONDANSETRON HCL 4 MG/2ML IJ SOLN: 4.0000 mg | Freq: Three times a day (TID) | INTRAMUSCULAR | Status: DC | PRN

## 2018-07-14 MED ORDER — SODIUM CHLORIDE 0.9 % IV SOLN: INTRAVENOUS | Status: DC

## 2018-07-14 MED ORDER — LATANOPROST 0.005 % OP SOLN
1.0000 [drp] | Freq: Every day | OPHTHALMIC | Status: DC
  Administered 2018-07-14 – 2018-07-23 (×10): 1 [drp] via OPHTHALMIC
  Filled 2018-07-14: qty 2.5

## 2018-07-14 MED ORDER — POTASSIUM CHLORIDE 10 MEQ/100ML IV SOLN
10.0000 meq | INTRAVENOUS | Status: AC
  Administered 2018-07-14 (×4): 10 meq via INTRAVENOUS
  Filled 2018-07-14 (×4): qty 100

## 2018-07-14 MED ORDER — SIMVASTATIN 40 MG PO TABS
40.0000 mg | ORAL_TABLET | Freq: Every day | ORAL | Status: DC
  Administered 2018-07-14 – 2018-07-23 (×11): 40 mg via ORAL
  Filled 2018-07-14 (×11): qty 1

## 2018-07-14 MED ORDER — HEPARIN (PORCINE) 25000 UT/250ML-% IV SOLN
1000.0000 [IU]/h | INTRAVENOUS | Status: DC
  Administered 2018-07-14: 1000 [IU]/h via INTRAVENOUS
  Filled 2018-07-14: qty 250

## 2018-07-14 MED ORDER — MAGNESIUM SULFATE 2 GM/50ML IV SOLN
2.0000 g | Freq: Once | INTRAVENOUS | Status: AC
  Administered 2018-07-14: 2 g via INTRAVENOUS
  Filled 2018-07-14: qty 50

## 2018-07-14 MED ORDER — PANTOPRAZOLE SODIUM 40 MG IV SOLR
40.0000 mg | Freq: Every day | INTRAVENOUS | Status: DC
  Administered 2018-07-14: 40 mg via INTRAVENOUS
  Filled 2018-07-14: qty 40

## 2018-07-14 MED ORDER — ENSURE ENLIVE PO LIQD
237.0000 mL | Freq: Two times a day (BID) | ORAL | Status: DC
  Administered 2018-07-18: 237 mL via ORAL

## 2018-07-14 NOTE — Progress Notes (Signed)
CRITICAL VALUE ALERT  Critical Value:  Troponin 0.03  Date & Time Notied:  07/14/18 @0514   Provider Notified: Dr Sherral Hammers  Orders Received/Actions taken: No order received

## 2018-07-14 NOTE — ED Notes (Signed)
ED TO INPATIENT HANDOFF REPORT  ED Nurse Name and Phone #: Christinia Gully Name/Age/Gender [name] Last 85 y.o. female Room/Bed: WA14/WA14  Code Status   Code Status: Full Code  Home/SNF/Other Given to floor Patient oriented to: self, place, time and situation Is this baseline? Yes   Triage Complete: Triage complete  Chief Complaint loss of appetite  Triage Note EMS reports pt is from home. 3-5 days of decreased appetite and nausea, able to tol a little juice this morning. 142/80-94% RA-85-CBG 148. Pt alert and oriented.   She comes to [place] from home with c/o of "feeling kinds weak and I still don't have a appetite". [sic] She reports a mild cough, and reports no pain or discomfort. She is alert and oriented and is surprised to find she has a fever.   Allergies No Known Allergies  Level of Care/Admitting Diagnosis ED Disposition    ED Disposition Condition Comment   Admit  Hospital Area: [place] [[id number]]  Level of Care: Telemetry [5]  Admit to tele based on following criteria: Other see comments  Comments: Fatigue  Diagnosis: Vomiting [175102]  Admitting Physician: Allie Bossier [[id number]]  Attending Physician: Allie Bossier [[id number]]  PT Class (Do Not Modify): Observation [104]  PT Acc Code (Do Not Modify): Observation [10022]       B Medical/Surgery History Past Medical History:  Diagnosis Date  . Complete heart block ([place])   . Gait abnormality 07/23/2016  . History of hypertension   . History of stroke   . Hyperhomocysteinemia ([place])   . Hyperlipidemia   . Hypertension   . Hypokalemia   . Hypokalemia 07/13/2018  . Low blood magnesium 07/13/2018  . Pacemaker-Medtronic 08/07/2011  . Vomiting 07/13/2018   Past Surgical History:  Procedure Laterality Date  . CARDIAC CATHETERIZATION    . PACEMAKER INSERTION     Medtronic Enpulse dual-chamber pacemaker  . PERMANENT PACEMAKER GENERATOR CHANGE N/A 07/30/2013   Procedure: PERMANENT  PACEMAKER GENERATOR CHANGE;  Surgeon: [name], MD;  Location: [facility] CATH LAB;  Service: Cardiovascular;  Laterality: N/A;     A IV Location/Drains/Wounds Patient Lines/Drains/Airways Status   Active Line/Drains/Airways    Name:   Placement date:   Placement time:   Site:   Days:   Peripheral IV 07/13/18 Right Forearm   07/13/18    1616    Forearm   less than 1          Intake/Output Last 24 hours  Intake/Output Summary (Last 24 hours) at 07/13/2018 2359 Last data filed at 07/13/2018 2210 Gross per 24 hour  Intake 1317.54 ml  Output -  Net 1317.54 ml    Labs/Imaging Results for orders placed or performed during the hospital encounter of 07/13/18 (from the past 48 hour(s))  CBC with Differential/Platelet     Status: Abnormal   Collection Time: 07/13/18  2:20 PM  Result Value Ref Range   WBC 8.7 4.0 - 10.5 K/uL   RBC 3.98 3.87 - 5.11 MIL/uL   Hemoglobin 10.7 (L) 12.0 - 15.0 g/dL   HCT 36.0 36.0 - 46.0 %   MCV 90.5 80.0 - 100.0 fL   MCH 26.9 26.0 - 34.0 pg   MCHC 29.7 (L) 30.0 - 36.0 g/dL   RDW 15.9 (H) 11.5 - 15.5 %   Platelets 266 150 - 400 K/uL   nRBC 0.0 0.0 - 0.2 %   Neutrophils Relative % 76 %   Neutro Abs 6.6 1.7 -  7.7 K/uL   Lymphocytes Relative 16 %   Lymphs Abs 1.4 0.7 - 4.0 K/uL   Monocytes Relative 7 %   Monocytes Absolute 0.6 0.1 - 1.0 K/uL   Eosinophils Relative 0 %   Eosinophils Absolute 0.0 0.0 - 0.5 K/uL   Basophils Relative 0 %   Basophils Absolute 0.0 0.0 - 0.1 K/uL   Immature Granulocytes 1 %   Abs Immature Granulocytes 0.09 (H) 0.00 - 0.07 K/uL    Comment: Performed at [facility], [place] [address] [place], [place] [address]  Comprehensive metabolic panel     Status: Abnormal   Collection Time: 07/13/18  2:20 PM  Result Value Ref Range   Sodium 141 135 - 145 mmol/L   Potassium 2.8 (L) 3.5 - 5.1 mmol/L   Chloride 108 98 - 111 mmol/L   CO2 25 22 - 32 mmol/L   Glucose, Bld 89 70 - 99 mg/dL   BUN 11 8 - 23 mg/dL    Creatinine, Ser 0.75 0.44 - 1.00 mg/dL   Calcium 7.9 (L) 8.9 - 10.3 mg/dL   Total Protein 6.6 6.5 - 8.1 g/dL   Albumin 2.7 (L) 3.5 - 5.0 g/dL   AST 13 (L) 15 - 41 U/L   ALT 8 0 - 44 U/L   Alkaline Phosphatase 57 38 - 126 U/L   Total Bilirubin 0.8 0.3 - 1.2 mg/dL   GFR calc non Af Amer >60 >60 mL/min   GFR calc Af Amer >60 >60 mL/min   Anion gap 8 5 - 15    Comment: Performed at [facility], [place] [address] [place],  [place] [address]  Urinalysis, Routine w reflex microscopic     Status: Abnormal   Collection Time: 07/13/18  2:20 PM  Result Value Ref Range   Color, Urine YELLOW YELLOW   APPearance HAZY (A) CLEAR   Specific Gravity, Urine 1.017 1.005 - 1.030   pH 5.0 5.0 - 8.0   Glucose, UA NEGATIVE NEGATIVE mg/dL   Hgb urine dipstick SMALL (A) NEGATIVE   Bilirubin Urine NEGATIVE NEGATIVE   Ketones, ur 5 (A) NEGATIVE mg/dL   Protein, ur 30 (A) NEGATIVE mg/dL   Nitrite NEGATIVE NEGATIVE   Leukocytes,Ua NEGATIVE NEGATIVE   RBC / HPF 0-5 0 - 5 RBC/hpf   WBC, UA 0-5 0 - 5 WBC/hpf   Bacteria, UA NONE SEEN NONE SEEN   Squamous Epithelial / LPF 6-10 0 - 5   Mucus PRESENT    Hyaline Casts, UA PRESENT     Comment: Performed at [facility], [place] [address], [place], [place] [address]  Influenza panel by PCR (type A & B)     Status: None   Collection Time: 07/13/18  3:16 PM  Result Value Ref Range   Influenza A By PCR NEGATIVE NEGATIVE   Influenza B By PCR NEGATIVE NEGATIVE    Comment: (NOTE) The Xpert Xpress Flu assay is intended as an aid in the diagnosis of  influenza and should not be used as a sole basis for treatment.  This  assay is FDA approved for nasopharyngeal swab specimens only. Nasal  washings and aspirates are unacceptable for Xpert Xpress Flu testing. Performed at [facility], [place] [address] [place], [place] [address]   Lactic acid, plasma     Status: None   Collection Time: 07/13/18  4:00 PM  Result Value  Ref Range   Lactic Acid, Venous 0.9 0.5 - 1.9 mmol/L  Comment: Performed at [facility], [place] [address], [place], [place] [address]  Magnesium     Status: Abnormal   Collection Time: 07/13/18  4:30 PM  Result Value Ref Range   Magnesium 1.4 (L) 1.7 - 2.4 mg/dL    Comment: Performed at [facility], [place] [address] [place], [place] [address]  Comprehensive metabolic panel     Status: Abnormal   Collection Time: 07/13/18  8:37 PM  Result Value Ref Range   Sodium 141 135 - 145 mmol/L   Potassium 3.5 3.5 - 5.1 mmol/L    Comment: DELTA CHECK NOTED NO VISIBLE HEMOLYSIS    Chloride 112 (H) 98 - 111 mmol/L   CO2 22 22 - 32 mmol/L   Glucose, Bld 96 70 - 99 mg/dL   BUN 10 8 - 23 mg/dL   Creatinine, Ser 0.74 0.44 - 1.00 mg/dL   Calcium 7.4 (L) 8.9 - 10.3 mg/dL   Total Protein 6.1 (L) 6.5 - 8.1 g/dL   Albumin 2.5 (L) 3.5 - 5.0 g/dL   AST 12 (L) 15 - 41 U/L   ALT 8 0 - 44 U/L   Alkaline Phosphatase 51 38 - 126 U/L   Total Bilirubin 0.9 0.3 - 1.2 mg/dL   GFR calc non Af Amer >60 >60 mL/min   GFR calc Af Amer >60 >60 mL/min   Anion gap 7 5 - 15    Comment: Performed at [facility], [place] [address] [place], [place] [address]  TSH     Status: None   Collection Time: 07/13/18  9:24 PM  Result Value Ref Range   TSH 0.716 0.350 - 4.500 uIU/mL    Comment: Performed by a 3rd Generation assay with a functional sensitivity of <=0.01 uIU/mL. Performed at [facility], [place] [address], [place], [place] [address]    Dg Chest 2 View  Result Date: 07/13/2018 CLINICAL DATA:  Decreased appetite and vomiting. Cough. Hypertension. EXAM: CHEST - 2 VIEW COMPARISON:  June 27, 2018 FINDINGS: There is no appreciable edema or consolidation. Heart is mildly enlarged with pulmonary vascularity normal. Pacemaker leads are attached to the right atrium and right ventricle. No adenopathy. There is degenerative change in the thoracic  spine. There is anterior wedging of a midthoracic vertebral body, stable. IMPRESSION: Mild cardiomegaly. No edema or consolidation. Pacemaker leads attached to right atrium and right ventricle. Electronically Signed   By: Lowella Grip III M.D.   On: 07/13/2018 15:47    Pending Labs Unresulted Labs (From admission, onward)    Start     Ordered   07/14/18 6606  Basic metabolic panel  Tomorrow morning,   R     07/13/18 2126   07/14/18 0500  Magnesium  Tomorrow morning,   R     07/13/18 2126   07/14/18 0500  CBC  Tomorrow morning,   R     07/13/18 2126   07/13/18 1515  Lactic acid, plasma  Now then every 2 hours,   STAT     07/13/18 1515   07/13/18 1420  Urine Culture  ONCE - STAT,   STAT     07/13/18 1420          Vitals/Pain Today's Vitals   07/13/18 2000 07/13/18 2030 07/13/18 2100 07/13/18 2306  BP: 138/64 124/62 119/70 (!) 144/73  Pulse: 62 60 90 74  Resp: (!) 24 (!) 25 (!) 25 20  Temp:      TempSrc:  SpO2: 92% 92% 91% 94%  PainSc:        Isolation Precautions No active isolations  Medications Medications  0.9 %  sodium chloride infusion ( Intravenous Stopped 07/13/18 2210)  acetaminophen (TYLENOL) tablet 650 mg (has no administration in time range)  0.9 %  sodium chloride infusion ( Intravenous New Bag/Given 07/13/18 2210)  traZODone (DESYREL) tablet 25 mg (has no administration in time range)  aspirin EC tablet 81 mg (81 mg Oral Given 07/13/18 2210)  acetaminophen (TYLENOL) tablet 650 mg (650 mg Oral Given 07/13/18 1356)  sodium chloride 0.9 % bolus 500 mL (0 mLs Intravenous Stopped 07/13/18 1956)  potassium chloride SA (K-DUR,KLOR-CON) CR tablet 40 mEq (40 mEq Oral Given 07/13/18 1805)  potassium chloride 10 mEq in 100 mL IVPB (0 mEq Intravenous Stopped 07/13/18 2046)    Mobility walks with person assist Moderate fall risk   Focused Assessments Cardiac Assessment Handoff:  Cardiac Rhythm: Ventricular paced Lab Results  Component Value Date   CKTOTAL 66  06/27/2018   TROPONINI 0.70 ([place]) 05/27/2018   No results found for: DDIMER Does the Patient currently have chest pain? No     R Recommendations: See Admitting Provider Note  Report given to:   Additional Notes:

## 2018-07-14 NOTE — Progress Notes (Signed)
ANTICOAGULATION CONSULT NOTE - Initial Consult  Pharmacy Consult for heparin Indication: chest pain/ACS  No Known Allergies  Patient Measurements: Weight: 190 lb (86.2 kg) Heparin Dosing Weight:   Vital Signs: Temp: 99.2 F (37.3 C) (03/16 0019) Temp Source: Oral (03/16 0019) BP: 146/60 (03/16 0019) Pulse Rate: 65 (03/16 0019)  Labs: Recent Labs    07/13/18 1420 07/13/18 2037  HGB 10.7*  --   HCT 36.0  --   PLT 266  --   CREATININE 0.75 0.74    Estimated Creatinine Clearance: 55.2 mL/min (by C-G formula based on SCr of 0.74 mg/dL).   Medical History: Past Medical History:  Diagnosis Date  . Complete heart block ([place])   . Gait abnormality 07/23/2016  . History of hypertension   . History of stroke   . Hyperhomocysteinemia ([place])   . Hyperlipidemia   . Hypertension   . Hypokalemia   . Hypokalemia 07/13/2018  . Low blood magnesium 07/13/2018  . Pacemaker-Medtronic 08/07/2011  . Vomiting 07/13/2018    Medications:  Infusions:  . sodium chloride Stopped (07/13/18 2210)  . sodium chloride 75 mL/hr at 07/14/18 0045  . heparin    . magnesium sulfate 1 - 4 g bolus IVPB 2 g (07/14/18 0156)  . potassium chloride      Assessment: Patient with r/o ACS.  Baseline coags ordered.  No oral anticoagulants noted on med rec.   Goal of Therapy:  Heparin level 0.3-0.7 units/ml Monitor platelets by anticoagulation protocol: Yes   Plan:  Heparin bolus 4000 units iv x1 Heparin drip at  1000 units/hr Daily CBC Next heparin level at [place], Shea Stakes [place] 07/14/2018,1:57 AM

## 2018-07-14 NOTE — Progress Notes (Addendum)
PROGRESS NOTE    [name]  OMA:[id number] DOB: 1933-06-16 DOA: 07/13/2018 PCP: Randel Books, FNP  Brief Narrative:85 y.o. BF PMHx complete heart block (s/p Medtronic Pacer 07/30/2013), HTN, HLD, CVA.  CKD stage III  Recent admission for rhabdomyolysis and acute on chronic kidney disease stage III. Patient presents to the hospital with complaints of nausea as well as episode of vomiting along with difficulty swallowing solid food. Patient was recently hospitalized for rhabdomyolysis with acute on chronic kidney injury. Discharge to SNF and from SNF patient was discharged back home on Monday. Patient was given aggressive IV hydration. On discharge patient was volume loaded and therefore was started on Lasix. Patient was taking Lasix up until yesterday on 06/26/2018 when she saw her PCP. Patient reports that she has lost 30 pounds since her recent hospitalization. No diarrhea reported. Has some mild abdominal discomfort. No acid reflux. Currently no nausea at the time of my evaluation but had an episode of vomiting yesterday when trying to eat solid food. She is able to swallow liquids as well as pills okay. No fever no chills. No cough. No rash anywhere.  Assessment & Plan:   Active Problems:   HYPERTENSION, HEART CONTROLLED W/O ASSOC CHF   AV BLOCK, COMPLETE   Pacemaker-Medtronic   Daytime somnolence   Essential hypertension   Hyperlipidemia   AKI (acute kidney injury) (HCC)   Hypokalemia   Low blood magnesium   Vomiting   #1 intractable nausea and vomiting patient admitted with intractable nausea and vomiting for the last 5 days.  Patient tried to drink ginger ale this morning and vomited that up.  She is afraid to eat as she thinks she is going to vomit.  Granddaughter reports that she has lost a weight of 25 to 30 pounds.  Denies any  previous history of peptic ulcer disease.  I will place her on Protonix IV and consult GI.  Continue IV fluids  #2 history of  complete heart block and pacemaker stable  #3 hypertension controlled on current medications.  #4 hyperlipidemia continue Zocor.  #5 hypokalemia and hypomagnesemia replete and recheck.  #6 fever unclear etiology chest x-ray negative for infiltrates UA negative for leukocytes or nitrates   Estimated body mass index is 34.75 kg/m as calculated from the following:   Height as of this encounter: 5\' 2"  (1.575 m).   Weight as of this encounter: 86.2 kg.  DVT prophylaxis: Lovenox  Code Status: Full code Family Communication: Discussed with granddaughter on the phone her number is [phone]. Disposition Plan: Pending clinical improvement  Consultants:   GI  Procedures none Antimicrobials: None  Subjective: Complains of vomiting unable to keep anything down with weight loss.  Objective: Vitals:   07/14/18 0019 07/14/18 0035 07/14/18 0100 07/14/18 0522  BP: (!) 146/60   (!) 146/82  Pulse: 65   92  Resp: 19   18  Temp: 99.2 F (37.3 C)   98.3 F (36.8 C)  TempSrc: Oral   Oral  SpO2: 98%   97%  Weight:  86.3 kg 86.2 kg   Height:  5\' 2"  (1.575 m)      Intake/Output Summary (Last 24 hours) at 07/14/2018 1103 Last data filed at 07/14/2018 0624 Gross per 24 hour  Intake 2109.02 ml  Output 50 ml  Net 2059.02 ml   Filed Weights   07/14/18 0035 07/14/18 0100  Weight: 86.3 kg 86.2 kg    Examination:  General exam: Appears calm and comfortable  Respiratory system:  Clear to auscultation. Respiratory effort normal. Cardiovascular system: S1 & S2 heard, RRR. No JVD, murmurs, rubs, gallops or clicks. No pedal edema. Gastrointestinal system: Abdomen is nondistended, soft and nontender. No organomegaly or masses felt. Normal bowel sounds heard. Central nervous system: Alert and oriented. No focal neurological deficits. Extremities: Symmetric 5 x 5 power. Skin: No rashes, lesions or ulcers Psychiatry: Judgement and insight appear normal. Mood & affect appropriate.     Data  Reviewed: I have personally reviewed following labs and imaging studies  CBC: Recent Labs  Lab 07/13/18 1420 07/14/18 0249  WBC 8.7 8.7  NEUTROABS 6.6  --   HGB 10.7* 10.4*  HCT 36.0 35.8*  MCV 90.5 94.5  PLT 266 350*   Basic Metabolic Panel: Recent Labs  Lab 07/13/18 1420 07/13/18 1630 07/13/18 2037 07/14/18 0249  NA 141  --  141 143  K 2.8*  --  3.5 3.7  CL 108  --  112* 113*  CO2 25  --  22 19*  GLUCOSE 89  --  96 83  BUN 11  --  10 9  CREATININE 0.75  --  0.74 0.77  CALCIUM 7.9*  --  7.4* 7.7*  MG  --  1.4*  --  2.0   GFR: Estimated Creatinine Clearance: 55.2 mL/min (by C-G formula based on SCr of 0.77 mg/dL). Liver Function Tests: Recent Labs  Lab 07/13/18 1420 07/13/18 2037  AST 13* 12*  ALT 8 8  ALKPHOS 57 51  BILITOT 0.8 0.9  PROT 6.6 6.1*  ALBUMIN 2.7* 2.5*   No results for input(s): LIPASE, AMYLASE in the last 168 hours. No results for input(s): AMMONIA in the last 168 hours. Coagulation Profile: Recent Labs  Lab 07/14/18 0249  INR 1.2   Cardiac Enzymes: Recent Labs  Lab 07/14/18 0249 07/14/18 0729  TROPONINI 0.03* <0.03   BNP (last 3 results) No results for input(s): PROBNP in the last 8760 hours. HbA1C: No results for input(s): HGBA1C in the last 72 hours. CBG: Recent Labs  Lab 07/14/18 0819  GLUCAP 89   Lipid Profile: Recent Labs    07/14/18 0249  CHOL 65  HDL 23*  LDLCALC 27  TRIG 76  CHOLHDL 2.8   Thyroid Function Tests: Recent Labs    07/13/18 2124  TSH 0.716   Anemia Panel: No results for input(s): VITAMINB12, FOLATE, FERRITIN, TIBC, IRON, RETICCTPCT in the last 72 hours. Sepsis Labs: Recent Labs  Lab 07/13/18 1600  LATICACIDVEN 0.9    No results found for this or any previous visit (from the past 240 hour(s)).       Radiology Studies: Dg Chest 2 View  Result Date: 07/13/2018 CLINICAL DATA:  Decreased appetite and vomiting. Cough. Hypertension. EXAM: CHEST - 2 VIEW COMPARISON:  June 27, 2018  FINDINGS: There is no appreciable edema or consolidation. Heart is mildly enlarged with pulmonary vascularity normal. Pacemaker leads are attached to the right atrium and right ventricle. No adenopathy. There is degenerative change in the thoracic spine. There is anterior wedging of a midthoracic vertebral body, stable. IMPRESSION: Mild cardiomegaly. No edema or consolidation. Pacemaker leads attached to right atrium and right ventricle. Electronically Signed   By: Lowella Grip III M.D.   On: 07/13/2018 15:47   Dg Abd 2 Views  Result Date: 07/14/2018 CLINICAL DATA:  Nausea and vomiting. EXAM: ABDOMEN - 2 VIEW COMPARISON:  06/27/2018 FINDINGS: There is no bowel dilation to suggest obstruction. There are air-fluid levels in nondistended bowel on the erect view.  No free air. No evidence of renal or ureteral stones. Stable phleboliths noted in the pelvis. There are scattered aortic and iliac artery vascular calcifications. No acute skeletal abnormality.  Lung bases are clear. IMPRESSION: 1. No evidence of bowel obstruction or free air. 2. Air-fluid levels within nondistended bowel. This is nonspecific but consistent with a low-grade adynamic ileus or gastroenteritis. Electronically Signed   By: Lajean Manes M.D.   On: 07/14/2018 01:36        Scheduled Meds: . acetaminophen  650 mg Oral Once  . aspirin EC  81 mg Oral Daily  . feeding supplement (ENSURE ENLIVE)  237 mL Oral BID BM  . pantoprazole (PROTONIX) IV  40 mg Intravenous Daily  . simvastatin  40 mg Oral QHS   Continuous Infusions: . sodium chloride Stopped (07/13/18 2210)  . sodium chloride 75 mL/hr at 07/14/18 0045  . heparin 1,000 Units/hr (07/14/18 0248)     LOS: 0 days     Georgette Shell, MD [facility] If 7PM-7AM, please contact night-coverage [email or web address] Password [email or web address] 07/14/2018, 11:03 AM

## 2018-07-14 NOTE — H&P (View-Only) (Signed)
 Referring Provider: [facility]   Primary Care Physician:  Willett, Annette C, FNP Primary Gastroenterologist:   unassigned     Reason for Consultation:   Nausea vomiting and weight loss      ASSESSMENT / PLAN:    1. 84 yo female with nausea / vomiting since late January, progressive over last several days. Her PO intake has basically been nothing since any PO intake leads to N/V.  Abdominal film today reveals air / fluid levels in non-distended bowel, ? Ileus. Her abdominal exam is unremarkable. Weight has fluctuated but weight is down ~ 10 pounds since late January.  -given persistent N/V with associated electrolyte disturbances and weight loss patient will need further workup. Her  Symptoms are out of proportion to radiographic findings. Will probably pursue diagnostic EGD ON PLAVIX.  The risks and benefits of EGD were discussed but at this point she wants to think about it. I told her we would check back later today for possible EGD in am unless becomes febrile again.   2. Fevers. Temp 102.3 in ED yesterday. CXR negative. Urine culture in progress. -Afebrile this am.   3. Hypokalemia / hypomagnesemia, resolved   4. Hx of CVA / chronic antiplatelet therapy, on Plavix    HPI:      HPI: [name] is a 84 y.o. female a history of complete heart black s/p pacemaker, HTN, CVA, and cholelithiasis. She was admitted late January into early February with mechanical fall , rhabdomyolysis, acute on chronic kidney disease ,  hypotension, Klebsiella pneumonia bacteremia.  Liver tests and renal function improved, discharged on 05/31/18  She was readmitted the end of February with N./ V, weight loss, and electrolyte abnormalities. Notes mention complaints of solid food dysphagia but patient says she never had any problems swallowing. Esophagram was unremarkable.    Patient brought back to ED yesterday for ongoing / worsening nausea / vomiting / ongoing poor appetite.  She reported  a mild cough (phlegm) as well as weakness.  Found to have temp of 102.3 on arrival.  WBC was normal. Mg+ and K+ low. Influenza test negative.    Ms Hand says the nausea / vomiting is related to PO intake and started around time of admission late January. She feels okay prior to attempts to eat but almost immediately becomes nauseated and vomits after eating anything or even drinking water. She doesn't want to eat because she hates to vomit. She doesn't recall starting any new meds to attribute symptoms to. She has no abdominal pain. She "sometimes" has loose stools at home. No urinary symptoms.    Past Medical History:  Diagnosis Date  . Complete heart block (HCC)   . Gait abnormality 07/23/2016  . History of hypertension   . History of stroke   . Hyperhomocysteinemia (HCC)   . Hyperlipidemia   . Hypertension   . Hypokalemia 07/13/2018  . Low blood magnesium 07/13/2018  . Pacemaker-Medtronic 08/07/2011    Past Surgical History:  Procedure Laterality Date  . CARDIAC CATHETERIZATION    . PACEMAKER INSERTION     Medtronic Enpulse dual-chamber pacemaker  . PERMANENT PACEMAKER GENERATOR CHANGE N/A 07/30/2013   Procedure: PERMANENT PACEMAKER GENERATOR CHANGE;  Surgeon: Steven C Klein, MD;  Location: [facility] CATH LAB;  Service: Cardiovascular;  Laterality: N/A;    Prior to Admission medications   Medication Sig Start Date End Date Taking? Authorizing Provider  acetaminophen (TYLENOL) 500 MG tablet Take 500-1,000 mg by mouth every 6 (six) hours as   needed for moderate pain.    Yes [provider]  aspirin 81 MG tablet Take 81 mg by mouth daily.   Yes [provider]  Blood Pressure KIT Automated blood pressure measuring device. 06/29/18  Yes Dahal, Binaya, MD  clopidogrel (PLAVIX) 75 MG tablet Take 75 mg by mouth daily with breakfast.   Yes [provider]  LUMIGAN 0.01 % SOLN Place 1 drop into both eyes at bedtime. 01/13/18  Yes [provider]  ondansetron  (ZOFRAN) 4 MG tablet Take 4 mg by mouth every 8 (eight) hours as needed for nausea or vomiting.  06/26/18  Yes [provider]  simvastatin (ZOCOR) 40 MG tablet Take 1 tablet (40 mg total) by mouth at bedtime. 10/25/17  Yes Klein, Steven C, MD  albuterol (PROVENTIL) (2.5 MG/3ML) 0.083% nebulizer solution Take 3 mLs (2.5 mg total) by nebulization every 2 (two) hours as needed for wheezing. Patient not taking: Reported on 06/27/2018 05/31/18   Ghimire, Shanker M, MD  guaiFENesin (MUCINEX) 600 MG 12 hr tablet Take 1 tablet (600 mg total) by mouth 2 (two) times daily. Patient not taking: Reported on 06/27/2018 05/31/18   Ghimire, Shanker M, MD  ipratropium-albuterol (DUONEB) 0.5-2.5 (3) MG/3ML SOLN Take 3 mLs by nebulization every 8 (eight) hours. Patient not taking: Reported on 06/27/2018 05/31/18   Ghimire, Shanker M, MD    Current Facility-Administered Medications  Medication Dose Route Frequency Provider Last Rate Last Dose  . 0.9 %  sodium chloride infusion   Intravenous Continuous Mathews, [name] G, MD 75 mL/hr at 07/14/18 1133    . 0.9 %  sodium chloride infusion   Intravenous Continuous Woods, Curtis J, MD 75 mL/hr at 07/14/18 0045    . acetaminophen (TYLENOL) tablet 650 mg  650 mg Oral Once Allen, Anthony, MD      . aspirin EC tablet 81 mg  81 mg Oral Daily Woods, Curtis J, MD   81 mg at 07/14/18 0954  . [START ON 07/15/2018] clopidogrel (PLAVIX) tablet 75 mg  75 mg Oral Q breakfast Mathews, [name] G, MD      . feeding supplement (ENSURE ENLIVE) (ENSURE ENLIVE) liquid 237 mL  237 mL Oral BID BM Woods, Curtis J, MD      . latanoprost (XALATAN) 0.005 % ophthalmic solution 1 drop  1 drop Both Eyes QHS Mathews, [name] G, MD      . ondansetron (ZOFRAN) injection 4 mg  4 mg Intravenous Q8H PRN Woods, Curtis J, MD      . ondansetron (ZOFRAN) tablet 4 mg  4 mg Oral Q8H PRN Woods, Curtis J, MD      . pantoprazole (PROTONIX) injection 40 mg  40 mg Intravenous Daily Mathews, [name] G, MD       . simvastatin (ZOCOR) tablet 40 mg  40 mg Oral QHS Woods, Curtis J, MD   40 mg at 07/14/18 0147  . traZODone (DESYREL) tablet 25 mg  25 mg Oral QHS PRN Woods, Curtis J, MD        Allergies as of 07/13/2018  . (No Known Allergies)    Family History  Problem Relation Age of Onset  . Hypertension Mother     Social History   Socioeconomic History  . Marital status: Single    Spouse name: Not on file  . Number of children: 3  . Years of education: 12  . Highest education level: Not on file  Occupational History  . Occupation: Retired  Social Needs  . Financial   resource strain: Not on file  . Food insecurity:    Worry: Not on file    Inability: Not on file  . Transportation needs:    Medical: Not on file    Non-medical: Not on file  Tobacco Use  . Smoking status: Former Smoker    Last attempt to quit: 07/25/2006    Years since quitting: 11.9  . Smokeless tobacco: Never Used  Substance and Sexual Activity  . Alcohol use: No  . Drug use: No  . Sexual activity: Not on file  Lifestyle  . Physical activity:    Days per week: Not on file    Minutes per session: Not on file  . Stress: Not on file  Relationships  . Social connections:    Talks on phone: Not on file    Gets together: Not on file    Attends religious service: Not on file    Active member of club or organization: Not on file    Attends meetings of clubs or organizations: Not on file    Relationship status: Not on file  . Intimate partner violence:    Fear of current or ex partner: Not on file    Emotionally abused: Not on file    Physically abused: Not on file    Forced sexual activity: Not on file  Other Topics Concern  . Not on file  Social History Narrative   Lives alone   Caffeine use: Coffee daily   Right handed    Review of Systems: All systems reviewed and negative except where noted in HPI.  Physical Exam: Vital signs in last 24 hours: Temp:  [98.3 F (36.8 C)-102.3 F (39.1 C)] 98.3  F (36.8 C) (03/16 0522) Pulse Rate:  [60-99] 92 (03/16 0522) Resp:  [16-26] 18 (03/16 0522) BP: (119-146)/(60-82) 146/82 (03/16 0522) SpO2:  [90 %-98 %] 97 % (03/16 0522) Weight:  [86.2 kg-86.3 kg] 86.2 kg (03/16 0100) Last BM Date: 07/14/18 General:   Alert, female in beside chair in NAD Psych:  Pleasant, cooperative. Normal mood and affect. Eyes:  Pupils equal, sclera clear, no icterus.   Conjunctiva pink. Ears:  Normal auditory acuity. Nose:  No deformity, discharge,  or lesions. Neck:  Supple; no masses Lungs:  Clear throughout to auscultation.   No wheezes, crackles, or rhonchi.  Heart:  Regular rate and rhythm; no murmurs, no lower extremity edema Abdomen:  Soft, non-distended, nontender, BS active Rectal:  Deferred  Msk:  Symmetrical without gross deformities. . Neurologic:  Alert and  oriented x4;  grossly normal neurologically. Skin:  Intact without significant lesions or rashes.   Intake/Output from previous day: 03/15 0701 - 03/16 0700 In: 2109 [I.V.:1206.2; IV Piggyback:902.8] Out: 50 [Emesis/NG output:50] Intake/Output this shift: No intake/output data recorded.  Lab Results: Recent Labs    07/13/18 1420 07/14/18 0249  WBC 8.7 8.7  HGB 10.7* 10.4*  HCT 36.0 35.8*  PLT 266 122*   BMET Recent Labs    07/13/18 1420 07/13/18 2037 07/14/18 0249  NA 141 141 143  K 2.8* 3.5 3.7  CL 108 112* 113*  CO2 25 22 19*  GLUCOSE 89 96 83  BUN 11 10 9  CREATININE 0.75 0.74 0.77  CALCIUM 7.9* 7.4* 7.7*   LFT Recent Labs    07/13/18 2037  PROT 6.1*  ALBUMIN 2.5*  AST 12*  ALT 8  ALKPHOS 51  BILITOT 0.9   PT/INR Recent Labs    07/14/18 0249  LABPROT 15.0  INR   1.2   Hepatitis Panel No results for input(s): HEPBSAG, HCVAB, HEPAIGM, HEPBIGM in the last 72 hours.  Studies/Results: Dg Chest 2 View  Result Date: 07/13/2018 CLINICAL DATA:  Decreased appetite and vomiting. Cough. Hypertension. EXAM: CHEST - 2 VIEW COMPARISON:  June 27, 2018  FINDINGS: There is no appreciable edema or consolidation. Heart is mildly enlarged with pulmonary vascularity normal. Pacemaker leads are attached to the right atrium and right ventricle. No adenopathy. There is degenerative change in the thoracic spine. There is anterior wedging of a midthoracic vertebral body, stable. IMPRESSION: Mild cardiomegaly. No edema or consolidation. Pacemaker leads attached to right atrium and right ventricle. Electronically Signed   By: William  Woodruff III M.D.   On: 07/13/2018 15:47   Dg Abd 2 Views  Result Date: 07/14/2018 CLINICAL DATA:  Nausea and vomiting. EXAM: ABDOMEN - 2 VIEW COMPARISON:  06/27/2018 FINDINGS: There is no bowel dilation to suggest obstruction. There are air-fluid levels in nondistended bowel on the erect view. No free air. No evidence of renal or ureteral stones. Stable phleboliths noted in the pelvis. There are scattered aortic and iliac artery vascular calcifications. No acute skeletal abnormality.  Lung bases are clear. IMPRESSION: 1. No evidence of bowel obstruction or free air. 2. Air-fluid levels within nondistended bowel. This is nonspecific but consistent with a low-grade adynamic ileus or gastroenteritis. Electronically Signed   By: David  Ormond M.D.   On: 07/14/2018 01:36   Paula Guenther, NP-C @  07/14/2018, 11:38 AM       

## 2018-07-14 NOTE — Consult Note (Signed)
Referring Provider: [facility]   Primary Care Physician:  Randel Books, FNP Primary Gastroenterologist:   unassigned     Reason for Consultation:   Nausea vomiting and weight loss      ASSESSMENT / PLAN:    26. 85 yo female with nausea / vomiting since late January, progressive over last several days. Her PO intake has basically been nothing since any PO intake leads to N/V.  Abdominal film today reveals air / fluid levels in non-distended bowel, ? Ileus. Her abdominal exam is unremarkable. Weight has fluctuated but weight is down ~ 10 pounds since late January.  -given persistent N/V with associated electrolyte disturbances and weight loss patient will need further workup. Her  Symptoms are out of proportion to radiographic findings. Will probably pursue diagnostic EGD ON PLAVIX.  The risks and benefits of EGD were discussed but at this point she wants to think about it. I told her we would check back later today for possible EGD in am unless becomes febrile again.   2. Fevers. Temp 102.3 in ED yesterday. CXR negative. Urine culture in progress. -Afebrile this am.   3. Hypokalemia / hypomagnesemia, resolved   4. Hx of CVA / chronic antiplatelet therapy, on Plavix    HPI:      HPI: [name] is a 85 y.o. female a history of complete heart black s/p pacemaker, HTN, CVA, and cholelithiasis. She was admitted late January into early February with mechanical fall , rhabdomyolysis, acute on chronic kidney disease ,  hypotension, Klebsiella pneumonia bacteremia.  Liver tests and renal function improved, discharged on 05/31/18  She was readmitted the end of February with N./ V, weight loss, and electrolyte abnormalities. Notes mention complaints of solid food dysphagia but patient says she never had any problems swallowing. Esophagram was unremarkable.    Patient brought back to ED yesterday for ongoing / worsening nausea / vomiting / ongoing poor appetite.  She reported  a mild cough (phlegm) as well as weakness.  Found to have temp of 102.3 on arrival.  WBC was normal. Mg+ and K+ low. Influenza test negative.    [name] says the nausea / vomiting is related to PO intake and started around time of admission late January. She feels okay prior to attempts to eat but almost immediately becomes nauseated and vomits after eating anything or even drinking water. She doesn't want to eat because she hates to vomit. She doesn't recall starting any new meds to attribute symptoms to. She has no abdominal pain. She "sometimes" has loose stools at home. No urinary symptoms.    Past Medical History:  Diagnosis Date  . Complete heart block ([place])   . Gait abnormality 07/23/2016  . History of hypertension   . History of stroke   . Hyperhomocysteinemia ([place])   . Hyperlipidemia   . Hypertension   . Hypokalemia 07/13/2018  . Low blood magnesium 07/13/2018  . Pacemaker-Medtronic 08/07/2011    Past Surgical History:  Procedure Laterality Date  . CARDIAC CATHETERIZATION    . PACEMAKER INSERTION     Medtronic Enpulse dual-chamber pacemaker  . PERMANENT PACEMAKER GENERATOR CHANGE N/A 07/30/2013   Procedure: PERMANENT PACEMAKER GENERATOR CHANGE;  Surgeon: [name], MD;  Location: [facility] CATH LAB;  Service: Cardiovascular;  Laterality: N/A;    Prior to Admission medications   Medication Sig Start Date End Date Taking? Authorizing Provider  acetaminophen (TYLENOL) 500 MG tablet Take 500-1,000 mg by mouth every 6 (six) hours as  needed for moderate pain.    Yes [provider]  aspirin 81 MG tablet Take 81 mg by mouth daily.   Yes [provider]  Blood Pressure KIT Automated blood pressure measuring device. 06/29/18  Yes Dahal, Marlowe Aschoff, MD  clopidogrel (PLAVIX) 75 MG tablet Take 75 mg by mouth daily with breakfast.   Yes [provider]  LUMIGAN 0.01 % SOLN Place 1 drop into both eyes at bedtime. 01/13/18  Yes [provider]  ondansetron  (ZOFRAN) 4 MG tablet Take 4 mg by mouth every 8 (eight) hours as needed for nausea or vomiting.  06/26/18  Yes [provider]  simvastatin (ZOCOR) 40 MG tablet Take 1 tablet (40 mg total) by mouth at bedtime. 10/25/17  Yes [name], MD  albuterol (PROVENTIL) (2.5 MG/3ML) 0.083% nebulizer solution Take 3 mLs (2.5 mg total) by nebulization every 2 (two) hours as needed for wheezing. Patient not taking: Reported on 06/27/2018 05/31/18   Jonetta Osgood, MD  guaiFENesin (MUCINEX) 600 MG 12 hr tablet Take 1 tablet (600 mg total) by mouth 2 (two) times daily. Patient not taking: Reported on 06/27/2018 05/31/18   Jonetta Osgood, MD  ipratropium-albuterol (DUONEB) 0.5-2.5 (3) MG/3ML SOLN Take 3 mLs by nebulization every 8 (eight) hours. Patient not taking: Reported on 06/27/2018 05/31/18   Jonetta Osgood, MD    Current Facility-Administered Medications  Medication Dose Route Frequency Provider Last Rate Last Dose  . 0.9 %  sodium chloride infusion   Intravenous Continuous Georgette Shell, MD 75 mL/hr at 07/14/18 1133    . 0.9 %  sodium chloride infusion   Intravenous Continuous Allie Bossier, MD 75 mL/hr at 07/14/18 0045    . acetaminophen (TYLENOL) tablet 650 mg  650 mg Oral Once Lacretia Leigh, MD      . aspirin EC tablet 81 mg  81 mg Oral Daily Allie Bossier, MD   81 mg at 07/14/18 0954  . [START ON 07/15/2018] clopidogrel (PLAVIX) tablet 75 mg  75 mg Oral Q breakfast Georgette Shell, MD      . feeding supplement (ENSURE ENLIVE) (ENSURE ENLIVE) liquid 237 mL  237 mL Oral BID BM Allie Bossier, MD      . latanoprost (XALATAN) 0.005 % ophthalmic solution 1 drop  1 drop Both Eyes QHS Georgette Shell, MD      . ondansetron [facility]) injection 4 mg  4 mg Intravenous Q8H PRN Allie Bossier, MD      . ondansetron [facility]) tablet 4 mg  4 mg Oral Q8H PRN Allie Bossier, MD      . pantoprazole (PROTONIX) injection 40 mg  40 mg Intravenous Daily Georgette Shell, MD       . simvastatin (ZOCOR) tablet 40 mg  40 mg Oral QHS Allie Bossier, MD   40 mg at 07/14/18 0147  . traZODone (DESYREL) tablet 25 mg  25 mg Oral QHS PRN Allie Bossier, MD        Allergies as of 07/13/2018  . (No Known Allergies)    Family History  Problem Relation Age of Onset  . Hypertension Mother     Social History   Socioeconomic History  . Marital status: Single    Spouse name: Not on file  . Number of children: 3  . Years of education: 32  . Highest education level: Not on file  Occupational History  . Occupation: Retired  Scientific laboratory technician  . Financial  resource strain: Not on file  . Food insecurity:    Worry: Not on file    Inability: Not on file  . Transportation needs:    Medical: Not on file    Non-medical: Not on file  Tobacco Use  . Smoking status: Former Smoker    Last attempt to quit: 07/25/2006    Years since quitting: 11.9  . Smokeless tobacco: Never Used  Substance and Sexual Activity  . Alcohol use: No  . Drug use: No  . Sexual activity: Not on file  Lifestyle  . Physical activity:    Days per week: Not on file    Minutes per session: Not on file  . Stress: Not on file  Relationships  . Social connections:    Talks on phone: Not on file    Gets together: Not on file    Attends religious service: Not on file    Active member of club or organization: Not on file    Attends meetings of clubs or organizations: Not on file    Relationship status: Not on file  . Intimate partner violence:    Fear of current or ex partner: Not on file    Emotionally abused: Not on file    Physically abused: Not on file    Forced sexual activity: Not on file  Other Topics Concern  . Not on file  Social History Narrative   Lives alone   Caffeine use: Coffee daily   Right handed    Review of Systems: All systems reviewed and negative except where noted in HPI.  Physical Exam: Vital signs in last 24 hours: Temp:  [98.3 F (36.8 C)-102.3 F (39.1 C)] 98.3  F (36.8 C) (03/16 0522) Pulse Rate:  [60-99] 92 (03/16 0522) Resp:  [16-26] 18 (03/16 0522) BP: (119-146)/(60-82) 146/82 (03/16 0522) SpO2:  [90 %-98 %] 97 % (03/16 0522) Weight:  [86.2 kg-86.3 kg] 86.2 kg (03/16 0100) Last BM Date: 07/14/18 General:   Alert, female in beside chair in NAD Psych:  Pleasant, cooperative. Normal mood and affect. Eyes:  Pupils equal, sclera clear, no icterus.   Conjunctiva pink. Ears:  Normal auditory acuity. Nose:  No deformity, discharge,  or lesions. Neck:  Supple; no masses Lungs:  Clear throughout to auscultation.   No wheezes, crackles, or rhonchi.  Heart:  Regular rate and rhythm; no murmurs, no lower extremity edema Abdomen:  Soft, non-distended, nontender, BS active Rectal:  Deferred  Msk:  Symmetrical without gross deformities. . Neurologic:  Alert and  oriented x4;  grossly normal neurologically. Skin:  Intact without significant lesions or rashes.   Intake/Output from previous day: 03/15 0701 - 03/16 0700 In: 2109 [I.V.:1206.2; IV Piggyback:902.8] Out: 50 [Emesis/NG output:50] Intake/Output this shift: No intake/output data recorded.  Lab Results: Recent Labs    07/13/18 1420 07/14/18 0249  WBC 8.7 8.7  HGB 10.7* 10.4*  HCT 36.0 35.8*  PLT 266 122*   BMET Recent Labs    07/13/18 1420 07/13/18 2037 07/14/18 0249  NA 141 141 143  K 2.8* 3.5 3.7  CL 108 112* 113*  CO2 25 22 19*  GLUCOSE 89 96 83  BUN _0 CREATININE 0.75 0.74 0.77  CALCIUM 7.9* 7.4* 7.7*   LFT Recent Labs    07/13/18 2037  PROT 6.1*  ALBUMIN 2.5*  AST 12*  ALT 8  ALKPHOS 51  BILITOT 0.9   PT/INR Recent Labs    07/14/18 0249  LABPROT 15.0  INR  1.2   Hepatitis Panel No results for input(s): HEPBSAG, HCVAB, HEPAIGM, HEPBIGM in the last 72 hours.  Studies/Results: Dg Chest 2 View  Result Date: 07/13/2018 CLINICAL DATA:  Decreased appetite and vomiting. Cough. Hypertension. EXAM: CHEST - 2 VIEW COMPARISON:  June 27, 2018  FINDINGS: There is no appreciable edema or consolidation. Heart is mildly enlarged with pulmonary vascularity normal. Pacemaker leads are attached to the right atrium and right ventricle. No adenopathy. There is degenerative change in the thoracic spine. There is anterior wedging of a midthoracic vertebral body, stable. IMPRESSION: Mild cardiomegaly. No edema or consolidation. Pacemaker leads attached to right atrium and right ventricle. Electronically Signed   By: Lowella Grip III M.D.   On: 07/13/2018 15:47   Dg Abd 2 Views  Result Date: 07/14/2018 CLINICAL DATA:  Nausea and vomiting. EXAM: ABDOMEN - 2 VIEW COMPARISON:  06/27/2018 FINDINGS: There is no bowel dilation to suggest obstruction. There are air-fluid levels in nondistended bowel on the erect view. No free air. No evidence of renal or ureteral stones. Stable phleboliths noted in the pelvis. There are scattered aortic and iliac artery vascular calcifications. No acute skeletal abnormality.  Lung bases are clear. IMPRESSION: 1. No evidence of bowel obstruction or free air. 2. Air-fluid levels within nondistended bowel. This is nonspecific but consistent with a low-grade adynamic ileus or gastroenteritis. Electronically Signed   By: Lajean Manes M.D.   On: 07/14/2018 01:36   Tye Savoy, NP-C @  07/14/2018, 11:38 AM

## 2018-07-14 NOTE — Progress Notes (Signed)
  Echocardiogram 2D Echocardiogram has been performed.  [name] 07/14/2018, 9:36 AM

## 2018-07-14 NOTE — Progress Notes (Signed)
Contacted Dr. Zigmund Daniel about patient desiring to have EGD done as was discussed with her by Tye Savoy, NP.  Dr. Zigmund Daniel instructed nurse to contact GI.  Nurse notified [facility] on call about putting patient on schedule for Tuesday, March 17.

## 2018-07-14 NOTE — Progress Notes (Signed)
Initial Nutrition Assessment  DOCUMENTATION CODES:   Obesity unspecified, Non-severe (moderate) malnutrition in context of acute illness/injury  INTERVENTION:    Continue Ensure Enlive po BID, each supplement provides 350 kcal and 20 grams of protein  Boost Breeze po PRN, each supplement provides 250 kcal and 9 grams of protein  NUTRITION DIAGNOSIS:   Moderate Malnutrition related to acute illness(intractable nausea/vomiting) as evidenced by percent weight loss, energy intake < 75% for > 7 days, mild muscle depletion.  GOAL:   Patient will meet greater than or equal to 90% of their needs  MONITOR:   PO intake, Supplement acceptance, Weight trends, Labs  REASON FOR ASSESSMENT:   Malnutrition Screening Tool    ASSESSMENT:   Patient with PMH significant for HTN, HLD, CVA, CKD III, and complete heart block s/p pacemaker. Presents this admission with intractable nausea and vomiting.    Pt endorses having a decreased appetite for three weeks due to on/off vomiting episodes. States she was recently discharged from [facility] after a 20 day admission. They provided her with 3 meals daily but she could only tolerate ~25% of each. She did not attempt supplementation during this time period. Her nausea/vomiting worsened in the last four days and she was unable to tolerate anything PO. Pt attempted to drink Boost Breeze and Ginger ale this am but vomited with both. Spoke with RN who thinks pt may have GI related illness, internal medicine paged to see if GI needs to get involved. RD to provide supplements once nausea/vomitng clears.   Pt reports a UBW of 206 lb and a recent wt loss of 30 lb. Records indicate pt weighed 217 lb on 2/1 and 190 lb this admission (12.4% wt loss in one month, significant for time frame).   Medications reviewed and include: NS @ 75 ml/hr Labs reviewed: Mg 1.4 (L)  NUTRITION - FOCUSED PHYSICAL EXAM:    Most Recent Value  Orbital Region  No depletion  Upper  Arm Region  No depletion  Thoracic and Lumbar Region  Unable to assess  Buccal Region  No depletion  Temple Region  No depletion  Clavicle Bone Region  No depletion  Clavicle and Acromion Bone Region  No depletion  Scapular Bone Region  Unable to assess  Dorsal Hand  No depletion  Patellar Region  Mild depletion  Anterior Thigh Region  Mild depletion  Posterior Calf Region  Mild depletion  Edema (RD Assessment)  None  Hair  Reviewed  Eyes  Reviewed  Mouth  Reviewed  Skin  Reviewed  Nails  Reviewed     Diet Order:   Diet Order            Diet heart healthy/carb modified Room service appropriate? Yes; Fluid consistency: Thin  Diet effective now              EDUCATION NEEDS:   Education needs have been addressed  Skin:  Skin Assessment: Reviewed RN Assessment  Last BM:  3/16  Height:   Ht Readings from Last 1 Encounters:  07/14/18 5\' 2"  (5.170 m)    Weight:   Wt Readings from Last 1 Encounters:  07/14/18 86.2 kg    Ideal Body Weight:  50 kg  BMI:  Body mass index is 34.75 kg/m.  Estimated Nutritional Needs:   Kcal:  1500-1700 kcal  Protein:  75-90 grams  Fluid:  >/= 1.5 L/day   Mariana Single RD, LDN Clinical Nutrition Pager # - [phone]

## 2018-07-14 NOTE — Progress Notes (Signed)
Patient arrived at approximately 0009 from the ED. She is alert and verbally responsive and in obvious cardio-pulmonary distress. Placed on tele as ordered. Patient has no complaints at this time.

## 2018-07-15 ENCOUNTER — Encounter (HOSPITAL_COMMUNITY): Payer: Self-pay

## 2018-07-15 ENCOUNTER — Observation Stay (HOSPITAL_COMMUNITY): Payer: Medicare Other | Admitting: Anesthesiology

## 2018-07-15 ENCOUNTER — Encounter (HOSPITAL_COMMUNITY)
Admission: EM | Disposition: A | Discharge status: Home Health | Payer: Self-pay | Source: Home / Self Care | Attending: Family Medicine

## 2018-07-15 ENCOUNTER — Inpatient Hospital Stay (HOSPITAL_COMMUNITY): Payer: Medicare Other

## 2018-07-15 DIAGNOSIS — Z8249 Family history of ischemic heart disease and other diseases of the circulatory system: Secondary | ICD-10-CM | POA: Diagnosis not present

## 2018-07-15 DIAGNOSIS — R54 Age-related physical debility: Secondary | ICD-10-CM | POA: Diagnosis present

## 2018-07-15 DIAGNOSIS — N183 Chronic kidney disease, stage 3 (moderate): Secondary | ICD-10-CM | POA: Diagnosis present

## 2018-07-15 DIAGNOSIS — I10 Essential (primary) hypertension: Secondary | ICD-10-CM | POA: Diagnosis not present

## 2018-07-15 DIAGNOSIS — Z87891 Personal history of nicotine dependence: Secondary | ICD-10-CM | POA: Diagnosis not present

## 2018-07-15 DIAGNOSIS — E440 Moderate protein-calorie malnutrition: Secondary | ICD-10-CM | POA: Diagnosis present

## 2018-07-15 DIAGNOSIS — Z8673 Personal history of transient ischemic attack (TIA), and cerebral infarction without residual deficits: Secondary | ICD-10-CM | POA: Diagnosis not present

## 2018-07-15 DIAGNOSIS — N2889 Other specified disorders of kidney and ureter: Secondary | ICD-10-CM | POA: Diagnosis present

## 2018-07-15 DIAGNOSIS — I442 Atrioventricular block, complete: Secondary | ICD-10-CM | POA: Diagnosis present

## 2018-07-15 DIAGNOSIS — N179 Acute kidney failure, unspecified: Secondary | ICD-10-CM | POA: Diagnosis present

## 2018-07-15 DIAGNOSIS — Z6834 Body mass index (BMI) 34.0-34.9, adult: Secondary | ICD-10-CM | POA: Diagnosis not present

## 2018-07-15 DIAGNOSIS — Z6835 Body mass index (BMI) 35.0-35.9, adult: Secondary | ICD-10-CM | POA: Diagnosis not present

## 2018-07-15 DIAGNOSIS — R400 Somnolence: Secondary | ICD-10-CM | POA: Diagnosis not present

## 2018-07-15 DIAGNOSIS — E785 Hyperlipidemia, unspecified: Secondary | ICD-10-CM | POA: Diagnosis present

## 2018-07-15 DIAGNOSIS — A0472 Enterocolitis due to Clostridium difficile, not specified as recurrent: Secondary | ICD-10-CM | POA: Diagnosis present

## 2018-07-15 DIAGNOSIS — E876 Hypokalemia: Secondary | ICD-10-CM | POA: Diagnosis present

## 2018-07-15 DIAGNOSIS — K567 Ileus, unspecified: Secondary | ICD-10-CM | POA: Diagnosis not present

## 2018-07-15 DIAGNOSIS — R509 Fever, unspecified: Secondary | ICD-10-CM | POA: Diagnosis present

## 2018-07-15 DIAGNOSIS — D649 Anemia, unspecified: Secondary | ICD-10-CM | POA: Diagnosis present

## 2018-07-15 DIAGNOSIS — I129 Hypertensive chronic kidney disease with stage 1 through stage 4 chronic kidney disease, or unspecified chronic kidney disease: Secondary | ICD-10-CM | POA: Diagnosis present

## 2018-07-15 DIAGNOSIS — R112 Nausea with vomiting, unspecified: Secondary | ICD-10-CM | POA: Diagnosis not present

## 2018-07-15 DIAGNOSIS — I119 Hypertensive heart disease without heart failure: Secondary | ICD-10-CM | POA: Diagnosis not present

## 2018-07-15 DIAGNOSIS — R05 Cough: Secondary | ICD-10-CM | POA: Diagnosis present

## 2018-07-15 DIAGNOSIS — R627 Adult failure to thrive: Secondary | ICD-10-CM | POA: Diagnosis present

## 2018-07-15 DIAGNOSIS — K449 Diaphragmatic hernia without obstruction or gangrene: Secondary | ICD-10-CM | POA: Diagnosis present

## 2018-07-15 DIAGNOSIS — Z950 Presence of cardiac pacemaker: Secondary | ICD-10-CM | POA: Diagnosis not present

## 2018-07-15 DIAGNOSIS — K2210 Ulcer of esophagus without bleeding: Secondary | ICD-10-CM | POA: Diagnosis present

## 2018-07-15 HISTORY — PX: ESOPHAGOGASTRODUODENOSCOPY: SHX5428

## 2018-07-15 HISTORY — PX: BIOPSY: SHX5522

## 2018-07-15 LAB — BASIC METABOLIC PANEL
Anion gap: 8 (ref 5–15)
BUN: 12 mg/dL (ref 8–23)
CALCIUM: 7.8 mg/dL — AB (ref 8.9–10.3)
CO2: 21 mmol/L — ABNORMAL LOW (ref 22–32)
Chloride: 116 mmol/L — ABNORMAL HIGH (ref 98–111)
Creatinine, Ser: 0.72 mg/dL (ref 0.44–1.00)
GFR calc Af Amer: >60 mL/min (ref >60)
Glucose, Bld: 78 mg/dL (ref 70–99)
Potassium: 3.3 mmol/L — ABNORMAL LOW (ref 3.5–5.1)
Sodium: 145 mmol/L (ref 135–145)

## 2018-07-15 LAB — CBC
HCT: 33 % — ABNORMAL LOW (ref 36.0–46.0)
Hemoglobin: 9.8 g/dL — ABNORMAL LOW (ref 12.0–15.0)
MCH: 27.1 pg (ref 26.0–34.0)
MCHC: 29.7 g/dL — AB (ref 30.0–36.0)
MCV: 91.4 fL (ref 80.0–100.0)
Platelets: 223 10*3/uL (ref 150–400)
RBC: 3.61 MIL/uL — ABNORMAL LOW (ref 3.87–5.11)
RDW: 16.5 % — ABNORMAL HIGH (ref 11.5–15.5)
WBC: 6.8 10*3/uL (ref 4.0–10.5)
nRBC: 0 % (ref 0.0–0.2)

## 2018-07-15 LAB — MAGNESIUM: Magnesium: 1.8 mg/dL (ref 1.7–2.4)

## 2018-07-15 SURGERY — EGD (ESOPHAGOGASTRODUODENOSCOPY)
Anesthesia: Monitor Anesthesia Care | Laterality: Left

## 2018-07-15 MED ORDER — PROPOFOL 10 MG/ML IV BOLUS
INTRAVENOUS | Status: DC | PRN
  Administered 2018-07-15 (×3): 20 mg via INTRAVENOUS

## 2018-07-15 MED ORDER — MAGNESIUM SULFATE 2 GM/50ML IV SOLN
2.0000 g | Freq: Once | INTRAVENOUS | Status: AC
  Administered 2018-07-15: 2 g via INTRAVENOUS
  Filled 2018-07-15: qty 50

## 2018-07-15 MED ORDER — PROPOFOL 500 MG/50ML IV EMUL
INTRAVENOUS | Status: DC | PRN
  Administered 2018-07-15: 100 ug/kg/min via INTRAVENOUS

## 2018-07-15 MED ORDER — PANTOPRAZOLE SODIUM 40 MG IV SOLR
40.0000 mg | Freq: Two times a day (BID) | INTRAVENOUS | Status: DC
  Administered 2018-07-15 – 2018-07-22 (×14): 40 mg via INTRAVENOUS
  Filled 2018-07-15 (×14): qty 40

## 2018-07-15 MED ORDER — LACTATED RINGERS IV SOLN
INTRAVENOUS | Status: DC
  Administered 2018-07-15: 10:00:00 via INTRAVENOUS

## 2018-07-15 MED ORDER — KCL IN DEXTROSE-NACL 40-5-0.9 MEQ/L-%-% IV SOLN
INTRAVENOUS | Status: DC
  Administered 2018-07-15 – 2018-07-23 (×12): via INTRAVENOUS
  Filled 2018-07-15 (×17): qty 1000

## 2018-07-15 MED ORDER — SUCRALFATE 1 G PO TABS
1.0000 g | ORAL_TABLET | Freq: Three times a day (TID) | ORAL | Status: DC
  Administered 2018-07-15 – 2018-07-21 (×24): 1 g via ORAL
  Filled 2018-07-15 (×24): qty 1

## 2018-07-15 MED ORDER — POTASSIUM CHLORIDE 10 MEQ/100ML IV SOLN
10.0000 meq | INTRAVENOUS | Status: AC
  Administered 2018-07-15 (×2): 10 meq via INTRAVENOUS
  Filled 2018-07-15: qty 100

## 2018-07-15 MED ORDER — PROPOFOL 10 MG/ML IV BOLUS
INTRAVENOUS | Status: AC
  Filled 2018-07-15: qty 40

## 2018-07-15 MED ORDER — PROCHLORPERAZINE EDISYLATE 10 MG/2ML IJ SOLN
5.0000 mg | Freq: Four times a day (QID) | INTRAMUSCULAR | Status: DC | PRN
  Administered 2018-07-16 – 2018-07-17 (×2): 5 mg via INTRAVENOUS
  Filled 2018-07-15 (×2): qty 2

## 2018-07-15 MED ORDER — POTASSIUM CHLORIDE 10 MEQ/100ML IV SOLN
10.0000 meq | INTRAVENOUS | Status: AC
  Administered 2018-07-15 (×4): 10 meq via INTRAVENOUS
  Filled 2018-07-15 (×5): qty 100

## 2018-07-15 NOTE — Evaluation (Addendum)
Physical Therapy Evaluation Patient Details Name: [name] MRN: [id number] DOB: [birth year] Today's Date: 07/15/2018   History of Present Illness  Pt is a 85 y.o. female admitted 3/15 with nausea, vomiting, and weakness. Pt had been at [facility] previously after a fall resulting in rhabdomyolysis and acute on chronic CKD. Pt had been home one day, then returned to ED for this hospitalization. PMH includes pacemaker, HTN, CVAx3 with L weakness per pt report, CHF, HLD.  Clinical Impression   Pt presents with LE weakness L>R secondary to chronic CVA, history of falling, difficulty performing mobility tasks, and decreased tolerance for activity due to pain. Pt to benefit from acute PT to address deficits. Pt given exercise handout to perform acutely, reviewed and practiced with pt. Pt ambulated 120 ft with RW with min guard assist for safety. PT recommending HHPT to address pt deficits, as pt states she was receiving HHPT and OT for 1 day PTA. PT asked pt if it would be possible to get increased aide assist each day, as she only gets 2 hours a day, but pt states she does not know. Pt insistent on remaining independent in community. PT to progress mobility as tolerated, and will continue to follow acutely.      Follow Up Recommendations Home health PT;Supervision for mobility/OOB    Equipment Recommendations  None recommended by PT    Recommendations for Other Services       Precautions / Restrictions Precautions Precautions: Fall;ICD/Pacemaker Restrictions Weight Bearing Restrictions: No      Mobility  Bed Mobility Overal bed mobility: Needs Assistance             General bed mobility comments: Pt up in chair upon arrival and requesting to stay in chair upon PT exit.   Transfers Overall transfer level: Needs assistance Equipment used: Rolling walker (2 wheeled) Transfers: Sit to/from Stand Sit to Stand: Min assist         General transfer comment: Min assist for  initial power up, pt able to complete rising and steadying without physical assist.   Ambulation/Gait Ambulation/Gait assistance: Min guard;Supervision Gait Distance (Feet): 120 Feet Assistive device: Rolling walker (2 wheeled) Gait Pattern/deviations: Step-through pattern;Trendelenburg;Decreased stride length Gait velocity: decr    General Gait Details: Min guard to supervision for safety. Pt with slow, short-step gait with mild trendelenburg noted at LLE. Pt limited in ambulation distance by fatigue.  Stairs            Wheelchair Mobility    Modified Rankin (Stroke Patients Only)       Balance Overall balance assessment: Needs assistance;History of Falls Sitting-balance support: No upper extremity supported Sitting balance-Leahy Scale: Good     Standing balance support: Bilateral upper extremity supported Standing balance-Leahy Scale: Poor Standing balance comment: reliant on UE support                             Pertinent Vitals/Pain Pain Assessment: No/denies pain    Home Living Family/patient expects to be discharged to:: Private residence Living Arrangements: Alone Available Help at Discharge: Friend(s);Available PRN/intermittently;Family;Home health;Personal care attendant(Pt states "my family gets busy" and cannot always assist her. Pt has aide 7 days a week for 2 hours a day. ) Type of Home: House Home Access: Stairs to enter Entrance Stairs-Rails: None Entrance Stairs-Number of Steps: 1 Home Layout: One level Home Equipment: Toilet riser;Walker - 2 wheels;Tub bench;Grab bars - toilet  Prior Function Level of Independence: Independent with assistive device(s);Needs assistance   Gait / Transfers Assistance Needed: Pt uses RW for ambulation per report, and has for some time. Pt receives meals on wheels (previously documented in OT note).   ADL's / Homemaking Assistance Needed: Aide helps M-F for 2 hrs/day with bathing, household tasks         Hand Dominance   Dominant Hand: Right    Extremity/Trunk Assessment   Upper Extremity Assessment Upper Extremity Assessment: Overall WFL for tasks assessed    Lower Extremity Assessment Lower Extremity Assessment: Generalized weakness    Cervical / Trunk Assessment Cervical / Trunk Assessment: Normal  Communication   Communication: No difficulties  Cognition Arousal/Alertness: Awake/alert Behavior During Therapy: WFL for tasks assessed/performed Overall Cognitive Status: Within Functional Limits for tasks assessed                                        General Comments      Exercises General Exercises - Lower Extremity Ankle Circles/Pumps: AROM;Both;10 reps;Seated Long Arc Quad: AROM;Both;5 reps;Seated Heel Slides: AROM;Both;10 reps;Seated Hip ABduction/ADduction: AROM;Both;10 reps;Seated   Assessment/Plan    PT Assessment Patient needs continued PT services  PT Problem List Decreased strength;Decreased mobility;Decreased activity tolerance;Decreased balance       PT Treatment Interventions DME instruction;Functional mobility training;Balance training;Patient/family education;Gait training;Therapeutic activities;Therapeutic exercise;Stair training    PT Goals (Current goals can be found in the Care Plan section)  Acute Rehab PT Goals Patient Stated Goal: be independent as long as possible PT Goal Formulation: With patient Time For Goal Achievement: 07/29/18 Potential to Achieve Goals: Good    Frequency Min 3X/week   Barriers to discharge        Co-evaluation               AM-PAC PT "6 Clicks" Mobility  Outcome Measure Help needed turning from your back to your side while in a flat bed without using bedrails?: A Little Help needed moving from lying on your back to sitting on the side of a flat bed without using bedrails?: A Little Help needed moving to and from a bed to a chair (including a wheelchair)?: A Little Help needed  standing up from a chair using your arms (e.g., wheelchair or bedside chair)?: A Little Help needed to walk in hospital room?: A Little Help needed climbing 3-5 steps with a railing? : A Little 6 Click Score: 18    End of Session Equipment Utilized During Treatment: Gait belt Activity Tolerance: Patient tolerated treatment well;Patient limited by fatigue Patient left: in chair;with nursing/sitter in room;with call bell/phone within reach;with chair alarm set Nurse Communication: Mobility status PT Visit Diagnosis: Other abnormalities of gait and mobility (R26.89);Difficulty in walking, not elsewhere classified (R26.2);History of falling (Z91.81)    Time: 3094-0768 PT Time Calculation (min) (ACUTE ONLY): 28 min   Charges:   PT Evaluation $PT Eval Low Complexity: 1 Low PT Treatments $Gait Training: 8-22 mins        Julien Girt, PT Acute Rehabilitation Services Pager [phone]  Office [phone]   Keilee Denman D Elonda Husky 07/15/2018, 7:40 PM

## 2018-07-15 NOTE — Interval H&P Note (Signed)
History and Physical Interval Note:  07/15/2018 9:31 AM  [name]  has presented today for surgery, with the diagnosis of Nausea, vomiting.  The various methods of treatment have been discussed with the patient and family. After consideration of risks, benefits and other options for treatment, the patient has consented to  Procedure(s): ESOPHAGOGASTRODUODENOSCOPY (EGD) (Left) as a surgical intervention.  The patient's history has been reviewed, patient examined, no change in status, stable for surgery.  I have reviewed the patient's chart and labs.  Questions were answered to the patient's satisfaction.     Silvano Rusk

## 2018-07-15 NOTE — Anesthesia Preprocedure Evaluation (Addendum)
Anesthesia Evaluation  Patient identified by MRN, date of birth, ID band Patient awake    Reviewed: Allergy & Precautions, NPO status , Patient's Chart, lab work & pertinent test results  History of Anesthesia Complications Negative for: history of anesthetic complications  Airway Mallampati: II  TM Distance: >3 FB Neck ROM: Full    Dental  (+) Dental Advisory Given, Edentulous Upper, Edentulous Lower   Pulmonary former smoker,    Pulmonary exam normal breath sounds clear to auscultation       Cardiovascular hypertension, Normal cardiovascular exam+ dysrhythmias (3rd degree AV block with pacer) + pacemaker  Rhythm:Regular Rate:Normal     Neuro/Psych CVA    GI/Hepatic negative GI ROS, Neg liver ROS,   Endo/Other  negative endocrine ROS  Renal/GU negative Renal ROS     Musculoskeletal negative musculoskeletal ROS (+)   Abdominal   Peds  Hematology negative hematology ROS (+)   Anesthesia Other Findings Day of surgery medications reviewed with the patient.  Reproductive/Obstetrics                            Anesthesia Physical Anesthesia Plan  ASA: III  Anesthesia Plan: MAC   Post-op Pain Management:    Induction:   PONV Risk Score and Plan: Treatment may vary due to age or medical condition and Propofol infusion  Airway Management Planned: Natural Airway and Nasal Cannula  Additional Equipment:   Intra-op Plan:   Post-operative Plan:   Informed Consent: I have reviewed the patients History and Physical, chart, labs and discussed the procedure including the risks, benefits and alternatives for the proposed anesthesia with the patient or authorized representative who has indicated his/her understanding and acceptance.     Dental advisory given  Plan Discussed with: CRNA  Anesthesia Plan Comments:        Anesthesia Quick Evaluation

## 2018-07-15 NOTE — Anesthesia Procedure Notes (Signed)
Procedure Name: MAC Date/Time: 07/15/2018 9:52 AM Performed by: Lollie Sails, CRNA Pre-anesthesia Checklist: Patient identified, Emergency Drugs available, Suction available and Patient being monitored Oxygen Delivery Method: Nasal cannula

## 2018-07-15 NOTE — Evaluation (Signed)
Clinical/Bedside Swallow Evaluation Patient Details  Name: [name] MRN: [id number] Date of Birth: Oct 15, 1933  Today's Date: 07/15/2018 Time: SLP Start Time (ACUTE ONLY): 1500 SLP Stop Time (ACUTE ONLY): 1528 SLP Time Calculation (min) (ACUTE ONLY): 28 min  Past Medical History:  Past Medical History:  Diagnosis Date  . Complete heart block ([place])   . Gait abnormality 07/23/2016  . History of hypertension   . History of stroke   . Hyperhomocysteinemia ([place])   . Hyperlipidemia   . Hypertension   . Hypokalemia   . Hypokalemia 07/13/2018  . Low blood magnesium 07/13/2018  . Pacemaker-Medtronic 08/07/2011  . Vomiting 07/13/2018   Past Surgical History:  Past Surgical History:  Procedure Laterality Date  . CARDIAC CATHETERIZATION    . PACEMAKER INSERTION     Medtronic Enpulse dual-chamber pacemaker  . PERMANENT PACEMAKER GENERATOR CHANGE N/A 07/30/2013   Procedure: PERMANENT PACEMAKER GENERATOR CHANGE;  Surgeon: [name], MD;  Location: [facility] CATH LAB;  Service: Cardiovascular;  Laterality: N/A;   HPI:  85 yo female adm to [facility] with nausea/vomiting with intake, FTT.  Pt recently admitted to hospital after found down at home for 2 days - diagnosed with rhabdomylosis and required SNF stay.  She had been at [facility] for Rehab and was dc'd home.  Pt resides alone and reports dysphagia since rhabdomylosis episode.    She reports 30 pound weight loss since last hospital admission.  Pt has undergone esophagram 06/28/2018 showing minimal distal esophageal spasm, no stricture nor mass and small hiatal hernia.  Today EGD completed that showed non bleeding ulcer, tortuous esophagus and small hiatal hernia.  Pt has prior CVAs that caused her left sided weakness but did not cause swallowing deficits.  Pt denies problems swallowing, stating she can swallow fine but then feels nauseated and it "turns around" and comes back up.     Assessment / Plan / Recommendation Clinical Impression  Limited  evaluation assessing only what pt would accept.  CN exam unremarkable except of left facial asymmetry indicative of facial nerve deficit.  Voice and cough are strong.  Pt consumed only a small bolus of water via straw.  Swallow appeared timely without indication of airway compromise.     Pt symptoms are not consistent with an oropharyngeal dysphagia - as pt stated "I can swallow and get it down but it turns around and comes back up".  She states she will sometimes choke on what comes back up but does not have any problems getting it down.  Sequence of events per pt include - swallowing, feeling nauseated and then gagging per pt.    Note GI is following pt and her esophageal/GI diagnoses.  Note pt with also possible low grade adynamic ileus or gastroenteritis per abdomen view yesterday- ? if these could contribute to her symptoms significantly?    No SLP follow up indicated.  SLP Visit Diagnosis: Dysphagia, unspecified (R13.10)    Aspiration Risk  No limitations    Diet Recommendation Other (Comment)(defer to gi)   Liquid Administration via: Cup;Straw Medication Administration: (as tolerated) Supervision: Patient able to self feed Postural Changes: Remain upright for at least 30 minutes after po intake;Seated upright at 90 degrees    Other  Recommendations Oral Care Recommendations: Oral care BID   Follow up Recommendations None      Frequency and Duration   n/a         Prognosis   n/a     Swallow Study   General  Date of Onset: 07/15/18 HPI: 85 yo female adm to [facility] with nausea/vomiting with intake, FTT.  Pt recently admitted to hospital after found down at home for 2 days - diagnosed with rhabdomylosis and required SNF stay.  She had been at [facility] for Rehab and was dc'd home.  Pt resides alone and reports dysphagia since rhabdomylosis episode.    She reports 30 pound weight loss since last hospital admission.  Pt has undergone esophagram 06/28/2018 showing minimal distal esophageal  spasm, no stricture nor mass and small hiatal hernia.  Today EGD completed that showed non bleeding ulcer, tortuous esophagus and small hiatal hernia.  Pt has prior CVAs that caused her left sided weakness but did not cause swallowing deficits.  Pt denies problems swallowing, stating she can swallow fine but then feels nauseated and it "turns around" and comes back up.   Type of Study: Bedside Swallow Evaluation Diet Prior to this Study: Thin liquids(clears) Temperature Spikes Noted: No Respiratory Status: Room air History of Recent Intubation: No Behavior/Cognition: Alert;Cooperative;Pleasant mood Oral Cavity Assessment: Within Functional Limits Oral Care Completed by SLP: No Oral Cavity - Dentition: Other (Comment) Vision: Functional for self-feeding Self-Feeding Abilities: Able to feed self Patient Positioning: Upright in chair Baseline Vocal Quality: Normal Volitional Cough: Strong Volitional Swallow: Able to elicit    Oral/Motor/Sensory Function Overall Oral Motor/Sensory Function: Moderate impairment Facial ROM: Reduced left Facial Symmetry: Abnormal symmetry left Facial Strength: Reduced left Facial Sensation: Within Functional Limits Lingual ROM: Within Functional Limits Lingual Symmetry: Within Functional Limits Lingual Strength: Within Functional Limits Lingual Sensation: Within Functional Limits Velum: Within Functional Limits   Ice Chips Ice chips: Not tested   Thin Liquid Thin Liquid: Within functional limits Presentation: Straw    Nectar Thick Nectar Thick Liquid: Not tested   Honey Thick Honey Thick Liquid: Not tested   Puree Puree: Not tested   Solid     Solid: Not tested      Macario Golds 07/15/2018,3:55 PM   Luanna Salk, MS [facility] SLP [place] Pager [phone] Office [phone]

## 2018-07-15 NOTE — Op Note (Signed)
[facility] Patient Name: [name] Procedure Date: 07/15/2018 MRN: [id number] Attending MD: [name] , MD Date of Birth: 1933-06-30 CSN: [id number] Age: 85 Admit Type: Inpatient Procedure:                Upper GI endoscopy Indications:              Persistent vomiting Providers:                [name], MD, Cleda Daub, RN, William Dalton, Technician Referring MD:              Medicines:                Propofol per Anesthesia, Monitored Anesthesia Care Complications:            No immediate complications. Estimated Blood Loss:     Estimated blood loss was minimal. Procedure:                Pre-Anesthesia Assessment:                           - Prior to the procedure, a History and Physical                            was performed, and patient medications and                            allergies were reviewed. The patient's tolerance of                            previous anesthesia was also reviewed. The risks                            and benefits of the procedure and the sedation                            options and risks were discussed with the patient.                            All questions were answered, and informed consent                            was obtained. Anticoagulants: The patient has taken                            aspirin and Plavix (clopidogrel). It was decided                            not to withhold these medications prior to                            procedure. ASA Grade Assessment: III - A patient  with severe systemic disease. After reviewing the                            risks and benefits, the patient was deemed in                            satisfactory condition to undergo the procedure.                           After obtaining informed consent, the endoscope was                            passed under direct vision. Throughout the                             procedure, the patient's blood pressure, pulse, and                            oxygen saturations were monitored continuously. The                            [id number]-[id number] ([id number] Olympus gastroscope was                            introduced through the mouth, and advanced to the                            second part of duodenum. The upper GI endoscopy was                            accomplished without difficulty. The patient                            tolerated the procedure well. Scope In: Scope Out: Findings:      One cratered esophageal ulcer with no bleeding was found at the       gastroesophageal junction. The lesion was 10 mm in largest dimension.       Biopsies were taken with a cold forceps for histology. Verification of       patient identification for the specimen was done. Estimated blood loss       was minimal.      A small hiatal hernia was present.      The examined esophagus was moderately tortuous.      The exam was otherwise without abnormality.      The cardia and gastric fundus were otherwise normal on retroflexion. Impression:               - Non-bleeding esophageal ulcer. Biopsied. 2 pieces                            from the edge of this black discolored ulcer crater                           - Small hiatal hernia.                           -  Tortuous esophagus.                           - The examination was otherwise normal. Moderate Sedation:      Not Applicable - Patient had care per Anesthesia. Recommendation:           - Continue present medications.                           - Clear liquid diet.                           - make PPI bid                           add sucralfate                           add reflux precautions                           consider CNS eval, additional strokes as a cause of                            nause and vomiting                           Hard to say if this ulcer is a cause or a result                             (favor latter) of process                           No evidence for an obstructive process - I suppose                            she could be having dysphagia interpreted as                            vomiting and then this ulcer and perhaps a spastic                            esophagus might be the cause                           we will f/u Procedure Code(s):        --- Professional ---                           [phone], Esophagogastroduodenoscopy, flexible,                            transoral; with biopsy, single or multiple Diagnosis Code(s):        --- Professional ---                           K22.10, Ulcer of esophagus without bleeding  K44.9, Diaphragmatic hernia without obstruction or                            gangrene                           Q39.9, Congenital malformation of esophagus,                            unspecified                           R11.10, Vomiting, unspecified CPT copyright 2018 [employer]. All rights reserved. The codes documented in this report are preliminary and upon coder review may  be revised to meet current compliance requirements. [name], MD 07/15/2018 10:12:21 AM This report has been signed electronically. Number of Addenda: 0

## 2018-07-15 NOTE — Anesthesia Postprocedure Evaluation (Signed)
Anesthesia Post Note  Patient: Marylu Berlinda Last  Procedure(s) Performed: ESOPHAGOGASTRODUODENOSCOPY (EGD) (Left ) BIOPSY     Patient location during evaluation: PACU Anesthesia Type: MAC Level of consciousness: awake and alert Pain management: pain level controlled Vital Signs Assessment: post-procedure vital signs reviewed and stable Respiratory status: spontaneous breathing, nonlabored ventilation and respiratory function stable Cardiovascular status: blood pressure returned to baseline and stable Postop Assessment: no apparent nausea or vomiting Anesthetic complications: no    Last Vitals:  Vitals:   07/15/18 0550 07/15/18 0939  BP: 120/68 (!) 153/93  Pulse: 86 77  Resp: 20 19  Temp: 37.1 C 36.8 C  SpO2: 98% 95%    Last Pain:  Vitals:   07/15/18 0939  TempSrc: Oral  PainSc: 0-No pain                 Brennan Bailey

## 2018-07-15 NOTE — Progress Notes (Signed)
PROGRESS NOTE    [name]  JOI:[id number] DOB: [birth year] DOA: 07/13/2018 PCP: Randel Books, FNP  Brief Narrative: 85 y.o.BF PMHxcomplete heart block (s/p MedtronicPacer 07/30/2013), HTN, HLD, CVA.CKD stage III  Recent admission for rhabdomyolysis and acute on chronic kidney disease stage III. Patient presents to the hospital with complaints of nausea as well as episode of vomiting along with difficulty swallowing solid food. Patient was recently hospitalized for rhabdomyolysis with acute on chronic kidney injury. Discharge to SNF and from SNF patient was discharged back home on Monday. Patient was given aggressive IV hydration. On discharge patient was volume loaded and therefore was started on Lasix. Patient was taking Lasix up until yesterday on 06/26/2018 when she saw her PCP. Patient reports that she has lost 30 pounds since her recent hospitalization. No diarrhea reported. Has some mild abdominal discomfort. No acid reflux. Currently no nausea at the time of my evaluation but had an episode of vomiting yesterday when trying to eat solid food. She is able to swallow liquids as well as pills okay. No fever no chills. No cough. No rash anywhere.  Assessment & Plan:   Active Problems:   HYPERTENSION, HEART CONTROLLED W/O ASSOC CHF   AV BLOCK, COMPLETE   Pacemaker-Medtronic   Daytime somnolence   Essential hypertension   Hyperlipidemia   AKI (acute kidney injury) (HCC)   Loss of weight   Hypokalemia   Low blood magnesium   Vomiting   Cough   Ileus (HCC)   Malnutrition of moderate degree   #1 intractable nausea and vomiting of unclear etiology.  Patient had EGD done today which showed esophageal ulcer which was biopsied x2.  She continues not to eat or drink anything because of fear of vomiting.  GI recommends twice a day PPI with sucralfate and clear liquid diet and reflux precautions.  Also recommend neuro work-up to see if this is related to stroke.   I will order a CT head and get speech therapy evaluation for question of dysphagia.  #2 history of complete heart block and pacemaker stable  #3 hypertension controlled on current medications.  #4 hyperlipidemia continue Zocor.  #5 hypokalemia and hypomagnesemia replete and recheck.  #6 fever on arrival to the ER but afebrile since then.  Unclear etiology chest x-ray negative for infiltrates UA negative for leukocytes or nitrates  DVT prophylaxis: Lovenox  Code Status: Full code Family Communication: Discussed with granddaughter on the phone her number is [phone]. Disposition Plan: Pending clinical improvement  Consultants:   GI  Procedures none Antimicrobials: None  Nutrition Problem: Moderate Malnutrition Etiology: acute illness(intractable nausea/vomiting)     Signs/Symptoms: percent weight loss, energy intake < 75% for > 7 days, mild muscle depletion    Interventions: Ensure Enlive (each supplement provides 350kcal and 20 grams of protein), Boost Breeze  Estimated body mass index is 35.08 kg/m as calculated from the following:   Height as of this encounter: 5\' 2"  (1.575 m).   Weight as of this encounter: 87 kg.   Subjective: Patient continues not to eat due to fear of vomiting she also reports some loose BM but she does not say it is diarrhea  Objective: Vitals:   07/15/18 0939 07/15/18 1010 07/15/18 1020 07/15/18 1030  BP: (!) 153/93 109/65 127/76 135/63  Pulse: 77 79 77 80  Resp: 19 (!) 24 (!) 21 19  Temp: 98.3 F (36.8 C)     TempSrc: Oral     SpO2: 95% 96% 94% 94%  Weight:  Height:        Intake/Output Summary (Last 24 hours) at 07/15/2018 1223 Last data filed at 07/15/2018 1007 Gross per 24 hour  Intake 2125.8 ml  Output -  Net 2125.8 ml   Filed Weights   07/14/18 0035 07/14/18 0100 07/15/18 0550  Weight: 86.3 kg 86.2 kg 87 kg    Examination: She is awake and alert  General exam: Appears calm and comfortable  Respiratory  system: Clear to auscultation. Respiratory effort normal. Cardiovascular system: S1 & S2 heard, RRR. No JVD, murmurs, rubs, gallops or clicks. No pedal edema. Gastrointestinal system: Abdomen is nondistended, soft and nontender. No organomegaly or masses felt. Normal bowel sounds heard. Central nervous system: Alert and oriented. No focal neurological deficits. Extremities: Symmetric 5 x 5 power. Skin: No rashes, lesions or ulcers Psychiatry: Judgement and insight appear normal. Mood & affect appropriate.     Data Reviewed: I have personally reviewed following labs and imaging studies  CBC: Recent Labs  Lab 07/13/18 1420 07/14/18 0249 07/15/18 0358  WBC 8.7 8.7 6.8  NEUTROABS 6.6  --   --   HGB 10.7* 10.4* 9.8*  HCT 36.0 35.8* 33.0*  MCV 90.5 94.5 91.4  PLT 266 122* 497   Basic Metabolic Panel: Recent Labs  Lab 07/13/18 1420 07/13/18 1630 07/13/18 2037 07/14/18 0249 07/15/18 0358  NA 141  --  141 143 145  K 2.8*  --  3.5 3.7 3.3*  CL 108  --  112* 113* 116*  CO2 25  --  22 19* 21*  GLUCOSE 89  --  96 83 78  BUN 11  --  10 9 12   CREATININE 0.75  --  0.74 0.77 0.72  CALCIUM 7.9*  --  7.4* 7.7* 7.8*  MG  --  1.4*  --  2.0 1.8   GFR: Estimated Creatinine Clearance: 55.5 mL/min (by C-G formula based on SCr of 0.72 mg/dL). Liver Function Tests: Recent Labs  Lab 07/13/18 1420 07/13/18 2037  AST 13* 12*  ALT 8 8  ALKPHOS 57 51  BILITOT 0.8 0.9  PROT 6.6 6.1*  ALBUMIN 2.7* 2.5*   No results for input(s): LIPASE, AMYLASE in the last 168 hours. No results for input(s): AMMONIA in the last 168 hours. Coagulation Profile: Recent Labs  Lab 07/14/18 0249  INR 1.2   Cardiac Enzymes: Recent Labs  Lab 07/14/18 0249 07/14/18 0729 07/14/18 1349  TROPONINI 0.03* <0.03 <0.03   BNP (last 3 results) No results for input(s): PROBNP in the last 8760 hours. HbA1C: No results for input(s): HGBA1C in the last 72 hours. CBG: Recent Labs  Lab 07/14/18 0819  GLUCAP 89    Lipid Profile: Recent Labs    07/14/18 0249  CHOL 65  HDL 23*  LDLCALC 27  TRIG 76  CHOLHDL 2.8   Thyroid Function Tests: Recent Labs    07/13/18 2124  TSH 0.716   Anemia Panel: No results for input(s): VITAMINB12, FOLATE, FERRITIN, TIBC, IRON, RETICCTPCT in the last 72 hours. Sepsis Labs: Recent Labs  Lab 07/13/18 1600  LATICACIDVEN 0.9    Recent Results (from the past 240 hour(s))  Urine Culture     Status: Abnormal   Collection Time: 07/13/18  2:20 PM  Result Value Ref Range Status   Specimen Description   Final    URINE, RANDOM Performed at [facility] [address] [place], [place] [address]    Special Requests   Final    NONE Performed at [facility]  [facility], [place] [address], [place], [place] [address]    Culture MULTIPLE SPECIES PRESENT, SUGGEST RECOLLECTION (A)  Final   Report Status 07/14/2018 FINAL  Final         Radiology Studies: Dg Chest 2 View  Result Date: 07/13/2018 CLINICAL DATA:  Decreased appetite and vomiting. Cough. Hypertension. EXAM: CHEST - 2 VIEW COMPARISON:  June 27, 2018 FINDINGS: There is no appreciable edema or consolidation. Heart is mildly enlarged with pulmonary vascularity normal. Pacemaker leads are attached to the right atrium and right ventricle. No adenopathy. There is degenerative change in the thoracic spine. There is anterior wedging of a midthoracic vertebral body, stable. IMPRESSION: Mild cardiomegaly. No edema or consolidation. Pacemaker leads attached to right atrium and right ventricle. Electronically Signed   By: Lowella Grip III M.D.   On: 07/13/2018 15:47   Dg Abd 2 Views  Result Date: 07/14/2018 CLINICAL DATA:  Nausea and vomiting. EXAM: ABDOMEN - 2 VIEW COMPARISON:  06/27/2018 FINDINGS: There is no bowel dilation to suggest obstruction. There are air-fluid levels in nondistended bowel on the erect view. No free air. No evidence of renal or ureteral stones. Stable  phleboliths noted in the pelvis. There are scattered aortic and iliac artery vascular calcifications. No acute skeletal abnormality.  Lung bases are clear. IMPRESSION: 1. No evidence of bowel obstruction or free air. 2. Air-fluid levels within nondistended bowel. This is nonspecific but consistent with a low-grade adynamic ileus or gastroenteritis. Electronically Signed   By: Lajean Manes M.D.   On: 07/14/2018 01:36        Scheduled Meds: . acetaminophen  650 mg Oral Once  . aspirin EC  81 mg Oral Daily  . clopidogrel  75 mg Oral Q breakfast  . enoxaparin (LOVENOX) injection  40 mg Subcutaneous Q24H  . feeding supplement (ENSURE ENLIVE)  237 mL Oral BID BM  . latanoprost  1 drop Both Eyes QHS  . pantoprazole (PROTONIX) IV  40 mg Intravenous Q12H  . simvastatin  40 mg Oral QHS  . sucralfate  1 g Oral TID WC & HS   Continuous Infusions: . sodium chloride 20 mL/hr at 07/15/18 0600  . sodium chloride 75 mL/hr at 07/15/18 0724     LOS: 0 days     Georgette Shell, MD [facility]  If 7PM-7AM, please contact night-coverage [email or web address] Password [facility] 07/15/2018, 12:23 PM

## 2018-07-15 NOTE — Transfer of Care (Signed)
Immediate Anesthesia Transfer of Care Note  Patient: [name]  Procedure(s) Performed: ESOPHAGOGASTRODUODENOSCOPY (EGD) (Left ) BIOPSY  Patient Location: PACU and Endoscopy Unit  Anesthesia Type:MAC  Level of Consciousness: awake, alert  and patient cooperative  Airway & Oxygen Therapy: Patient Spontanous Breathing and Patient connected to nasal cannula oxygen  Post-op Assessment: Report given to RN and Post -op Vital signs reviewed and stable  Post vital signs: Reviewed and stable  Last Vitals:  Vitals Value Taken Time  BP 102/59 07/15/2018 10:06 AM  Temp    Pulse 84 07/15/2018 10:07 AM  Resp 23 07/15/2018 10:07 AM  SpO2 97 % 07/15/2018 10:07 AM  Vitals shown include unvalidated device data.  Last Pain:  Vitals:   07/15/18 0939  TempSrc: Oral  PainSc: 0-No pain         Complications: No apparent anesthesia complications

## 2018-07-16 ENCOUNTER — Inpatient Hospital Stay (HOSPITAL_COMMUNITY): Payer: Medicare Other

## 2018-07-16 ENCOUNTER — Encounter (HOSPITAL_COMMUNITY): Payer: Self-pay | Admitting: Internal Medicine

## 2018-07-16 DIAGNOSIS — R112 Nausea with vomiting, unspecified: Secondary | ICD-10-CM

## 2018-07-16 DIAGNOSIS — I119 Hypertensive heart disease without heart failure: Secondary | ICD-10-CM

## 2018-07-16 LAB — BASIC METABOLIC PANEL
Anion gap: 7 (ref 5–15)
BUN: 10 mg/dL (ref 8–23)
CO2: 20 mmol/L — ABNORMAL LOW (ref 22–32)
Calcium: 7.8 mg/dL — ABNORMAL LOW (ref 8.9–10.3)
Chloride: 117 mmol/L — ABNORMAL HIGH (ref 98–111)
Creatinine, Ser: 0.71 mg/dL (ref 0.44–1.00)
GFR calc Af Amer: >60 mL/min (ref >60)
GFR calc non Af Amer: >60 mL/min (ref >60)
Glucose, Bld: 95 mg/dL (ref 70–99)
Potassium: 4.2 mmol/L (ref 3.5–5.1)
Sodium: 144 mmol/L (ref 135–145)

## 2018-07-16 LAB — CBC
HCT: 32.1 % — ABNORMAL LOW (ref 36.0–46.0)
Hemoglobin: 9.4 g/dL — ABNORMAL LOW (ref 12.0–15.0)
MCH: 26.9 pg (ref 26.0–34.0)
MCHC: 29.3 g/dL — ABNORMAL LOW (ref 30.0–36.0)
MCV: 92 fL (ref 80.0–100.0)
Platelets: 219 10*3/uL (ref 150–400)
RBC: 3.49 MIL/uL — ABNORMAL LOW (ref 3.87–5.11)
RDW: 16.6 % — ABNORMAL HIGH (ref 11.5–15.5)
WBC: 6 10*3/uL (ref 4.0–10.5)
nRBC: 0 % (ref 0.0–0.2)

## 2018-07-16 MED ORDER — ONDANSETRON HCL 4 MG/2ML IJ SOLN
4.0000 mg | Freq: Three times a day (TID) | INTRAMUSCULAR | Status: AC
  Administered 2018-07-16 – 2018-07-18 (×6): 4 mg via INTRAVENOUS
  Filled 2018-07-16 (×6): qty 2

## 2018-07-16 NOTE — Progress Notes (Signed)
PROGRESS NOTE    [name]  XBJ:[id number] DOB: 06/07/1933 DOA: 07/13/2018 PCP: Randel Books, FNP   Brief Narrative: [name] is a 85 y.o. female with a history of complete heart block s/p pacemaker, hyperlipidemia, CKD stage III, stroke. She presented secondary to recurrent/intractable nausea and vomiting.   Assessment & Plan:   Active Problems:   HYPERTENSION, HEART CONTROLLED W/O ASSOC CHF   AV BLOCK, COMPLETE   Pacemaker-Medtronic   Daytime somnolence   Essential hypertension   Hyperlipidemia   AKI (acute kidney injury) (HCC)   Loss of weight   Hypokalemia   Low blood magnesium   Vomiting   Cough   Ileus (HCC)   Malnutrition of moderate degree   Intractable nausea/vomiting Unknown etiology. Possibly esophageal. S/p EGD on 3/17 significant for 2 gastric ulcers. History of strokes. CT head negative for acute stroke. No new neurologic symptoms. -GI recommendations: PPI, carafate, scheduled zofran, MBSS  Complete heart block S/p pacemaker. Stable.  History of stroke -Continue aspirin and Plavix  Hyperlipidemia -Continue Zocor  Hypokalemia/hypomagnesemia Secondary to vomiting. Supplementation given. Stable  Fever Unknown etiology. No source. Resolved.  Anemia Acute. Unknown etiology.   CKD stage III Stable.   DVT prophylaxis: Lovenox Code Status:   Code Status: Full Code Family Communication: None at bedside Disposition Plan: Discharge pending GI management   Consultants:   Gastroenterology  Procedures:   EGD (07/15/2018) Impression:       - Non-bleeding esophageal ulcer. Biopsied. 2 pieces                            from the edge of this black discolored ulcer crater                           - Small hiatal hernia.                           - Tortuous esophagus.                           - The examination was otherwise normal.  Recommendation:                                      - Continue present medications.                            - Clear liquid diet.                           - make PPI bid                           add sucralfate                           add reflux precautions                           consider CNS eval, additional strokes as a cause of  nause and vomiting                           Hard to say if this ulcer is a cause or a result                            (favor latter) of process                           No evidence for an obstructive process - I suppose                            she could be having dysphagia interpreted as                            vomiting and then this ulcer and perhaps a spastic                            esophagus might be the cause                           we will f/u  Antimicrobials:  None    Subjective: Still nauseated when she tries to eat/drink with a feeling of needing to vomit. Has not eaten much food.  Objective: Vitals:   07/15/18 1030 07/15/18 1353 07/15/18 2130 07/16/18 0502  BP: 135/63 (!) 153/76 129/80 119/88  Pulse: 80 82 84 74  Resp: 19 16 20 16   Temp:  97.6 F (36.4 C) 98.9 F (37.2 C) 98.6 F (37 C)  TempSrc:  Oral Oral Oral  SpO2: 94% 99% 96% 97%  Weight:    89.6 kg  Height:        Intake/Output Summary (Last 24 hours) at 07/16/2018 1335 Last data filed at 07/16/2018 0900 Gross per 24 hour  Intake 735.33 ml  Output 3 ml  Net 732.33 ml   Filed Weights   07/14/18 0100 07/15/18 0550 07/16/18 0502  Weight: 86.2 kg 87 kg 89.6 kg    Examination:  General exam: Appears calm and comfortable Respiratory system: Clear to auscultation. Respiratory effort normal. Cardiovascular system: S1 & S2 heard, RRR. No murmurs, rubs, gallops or clicks. Gastrointestinal system: Abdomen is nondistended, soft and nontender. No organomegaly or masses felt. Normal bowel sounds heard. Central nervous system: Alert and oriented. No focal neurological deficits. Extremities: No edema. No calf  tenderness Skin: No cyanosis. No rashes Psychiatry: Judgement and insight appear normal. Mood & affect appropriate.     Data Reviewed: I have personally reviewed following labs and imaging studies  CBC: Recent Labs  Lab 07/13/18 1420 07/14/18 0249 07/15/18 0358 07/16/18 0321  WBC 8.7 8.7 6.8 6.0  NEUTROABS 6.6  --   --   --   HGB 10.7* 10.4* 9.8* 9.4*  HCT 36.0 35.8* 33.0* 32.1*  MCV 90.5 94.5 91.4 92.0  PLT 266 122* 223 026   Basic Metabolic Panel: Recent Labs  Lab 07/13/18 1420 07/13/18 1630 07/13/18 2037 07/14/18 0249 07/15/18 0358 07/16/18 0321  NA 141  --  141 143 145 144  K 2.8*  --  3.5 3.7 3.3* 4.2  CL 108  --  112* 113* 116* 117*  CO2 25  --  22  19* 21* 20*  GLUCOSE 89  --  96 83 78 95  BUN 11  --  10 9 12 10   CREATININE 0.75  --  0.74 0.77 0.72 0.71  CALCIUM 7.9*  --  7.4* 7.7* 7.8* 7.8*  MG  --  1.4*  --  2.0 1.8  --    GFR: Estimated Creatinine Clearance: 56.4 mL/min (by C-G formula based on SCr of 0.71 mg/dL). Liver Function Tests: Recent Labs  Lab 07/13/18 1420 07/13/18 2037  AST 13* 12*  ALT 8 8  ALKPHOS 57 51  BILITOT 0.8 0.9  PROT 6.6 6.1*  ALBUMIN 2.7* 2.5*   No results for input(s): LIPASE, AMYLASE in the last 168 hours. No results for input(s): AMMONIA in the last 168 hours. Coagulation Profile: Recent Labs  Lab 07/14/18 0249  INR 1.2   Cardiac Enzymes: Recent Labs  Lab 07/14/18 0249 07/14/18 0729 07/14/18 1349  TROPONINI 0.03* <0.03 <0.03   BNP (last 3 results) No results for input(s): PROBNP in the last 8760 hours. HbA1C: No results for input(s): HGBA1C in the last 72 hours. CBG: Recent Labs  Lab 07/14/18 0819  GLUCAP 89   Lipid Profile: Recent Labs    07/14/18 0249  CHOL 65  HDL 23*  LDLCALC 27  TRIG 76  CHOLHDL 2.8   Thyroid Function Tests: Recent Labs    07/13/18 2124  TSH 0.716   Anemia Panel: No results for input(s): VITAMINB12, FOLATE, FERRITIN, TIBC, IRON, RETICCTPCT in the last 72  hours. Sepsis Labs: Recent Labs  Lab 07/13/18 1600  LATICACIDVEN 0.9    Recent Results (from the past 240 hour(s))  Urine Culture     Status: Abnormal   Collection Time: 07/13/18  2:20 PM  Result Value Ref Range Status   Specimen Description   Final    URINE, RANDOM Performed at [facility] [address] [place], [place] [address]    Special Requests   Final    NONE Performed at [facility], [place] [address] [place], [place] [address]    Culture MULTIPLE SPECIES PRESENT, SUGGEST RECOLLECTION (A)  Final   Report Status 07/14/2018 FINAL  Final         Radiology Studies: Ct Head Wo Contrast  Result Date: 07/15/2018 CLINICAL DATA:  Onset difficulty swallowing today. EXAM: CT HEAD WITHOUT CONTRAST TECHNIQUE: Contiguous axial images were obtained from the base of the skull through the vertex without intravenous contrast. COMPARISON:  Head CT scan 05/26/2018 and 05/27/2018. FINDINGS: Brain: No evidence of acute infarction, hemorrhage, hydrocephalus, extra-axial collection or mass lesion/mass effect. Atrophy, chronic microvascular ischemic change remote basal ganglia lacunar infarctions on the right noted. Small left cerebellar infarct is also again seen. Vascular: No hyperdense vessel or unexpected calcification. Skull: Intact. Sinuses/Orbits: Status post cataract surgery.  Otherwise negative. Other: None. IMPRESSION: No acute abnormality. No change in atrophy, chronic microvascular ischemic disease small infarcts. Electronically Signed   By: Inge Rise M.D.   On: 07/15/2018 16:52        Scheduled Meds: . acetaminophen  650 mg Oral Once  . aspirin EC  81 mg Oral Daily  . clopidogrel  75 mg Oral Q breakfast  . enoxaparin (LOVENOX) injection  40 mg Subcutaneous Q24H  . feeding supplement (ENSURE ENLIVE)  237 mL Oral BID BM  . latanoprost  1 drop Both Eyes QHS  . ondansetron (ZOFRAN) IV  4 mg Intravenous Q8H  . pantoprazole  (PROTONIX) IV  40 mg Intravenous Q12H  .  simvastatin  40 mg Oral QHS  . sucralfate  1 g Oral TID WC & HS   Continuous Infusions: . sodium chloride 20 mL/hr at 07/15/18 0600  . sodium chloride 75 mL/hr at 07/15/18 0724  . dextrose 5 % and 0.9 % NaCl with KCl 40 mEq/L 75 mL/hr at 07/15/18 1427     LOS: 1 day     Cordelia Poche, MD [facility] 07/16/2018, 1:35 PM  If 7PM-7AM, please contact night-coverage [email or web address]

## 2018-07-16 NOTE — Progress Notes (Addendum)
[facility] Progress Note   Chief Complaint:   Nausea / vomiting  SUBJECTIVE:   Feels same which is okay unless tries to eat and then has nausea and vomiting.   ASSESSMENT AND PLAN:   1. 85 yo female with nausea / vomiting immediately after taking PO. EGD yesterday showed a non-bleeding, cratered 10 mm ulcer at GE junction. Biopsies are pending. Not sure that findings explains symptoms. She denies dysphagia but wonder if in fact she has some dysmotility / transfer issues of esophagus resulting in the immediate vomiting. -head CT scan negative for acute abnormalities -continue IV PPI ,  carafate -Esophogram  06/28/18 was unrevealing. Will proceed with MBSS. -will changed zofran from prn to scheduled dosing   2. Mild Cartwright anemia, hgb slowing declining this admission. No really get significant amount of IVF so doubt dilution. Monitor for now.   OBJECTIVE:     Vital signs in last 24 hours: Temp:  [97.6 F (36.4 C)-98.9 F (37.2 C)] 98.6 F (37 C) (03/18 0502) Pulse Rate:  [74-84] 74 (03/18 0502) Resp:  [16-24] 16 (03/18 0502) BP: (109-153)/(63-93) 119/88 (03/18 0502) SpO2:  [94 %-99 %] 97 % (03/18 0502) Weight:  [89.6 kg] 89.6 kg (03/18 0502) Last BM Date: 07/15/18 General:   Alert,  female in NAD EENT:  Normal hearing, non icteric sclera, conjunctive pink.  Heart:  Regular rate and rhythm.  No lower extremity edema   Pulm: Normal respiratory effort, lungs CTA bilaterally without wheezes or crackles. Abdomen:  Soft, nondistended, nontender.  Normal bowel sounds, Neurologic:  Alert and  oriented x4;  grossly normal neurologically. Psych:  Pleasant, cooperative.  Normal mood and affect.   Intake/Output from previous day: 03/17 0701 - 03/18 0700 In: 791.8 [P.O.:300; I.V.:241.8; IV Piggyback:250] Out: 3 [Urine:1; Stool:2] Intake/Output this shift: No intake/output data recorded.  Lab Results: Recent Labs    07/14/18 0249 07/15/18 0358 07/16/18 0321   WBC 8.7 6.8 6.0  HGB 10.4* 9.8* 9.4*  HCT 35.8* 33.0* 32.1*  PLT 122* 223 219   BMET Recent Labs    07/14/18 0249 07/15/18 0358 07/16/18 0321  NA 143 145 144  K 3.7 3.3* 4.2  CL 113* 116* 117*  CO2 19* 21* 20*  GLUCOSE 83 78 95  BUN 9 12 10   CREATININE 0.77 0.72 0.71  CALCIUM 7.7* 7.8* 7.8*   LFT Recent Labs    07/13/18 2037  PROT 6.1*  ALBUMIN 2.5*  AST 12*  ALT 8  ALKPHOS 51  BILITOT 0.9   PT/INR Recent Labs    07/14/18 0249  LABPROT 15.0  INR 1.2   Hepatitis Panel No results for input(s): HEPBSAG, HCVAB, HEPAIGM, HEPBIGM in the last 72 hours.  Ct Head Wo Contrast  Result Date: 07/15/2018 CLINICAL DATA:  Onset difficulty swallowing today. EXAM: CT HEAD WITHOUT CONTRAST TECHNIQUE: Contiguous axial images were obtained from the base of the skull through the vertex without intravenous contrast. COMPARISON:  Head CT scan 05/26/2018 and 05/27/2018. FINDINGS: Brain: No evidence of acute infarction, hemorrhage, hydrocephalus, extra-axial collection or mass lesion/mass effect. Atrophy, chronic microvascular ischemic change remote basal ganglia lacunar infarctions on the right noted. Small left cerebellar infarct is also again seen. Vascular: No hyperdense vessel or unexpected calcification. Skull: Intact. Sinuses/Orbits: Status post cataract surgery.  Otherwise negative. Other: None. IMPRESSION: No acute abnormality. No change in atrophy, chronic microvascular ischemic disease small infarcts. Electronically Signed   By: Inge Rise M.D.   On: 07/15/2018 16:52  Active Problems:   HYPERTENSION, HEART CONTROLLED W/O ASSOC CHF   AV BLOCK, COMPLETE   Pacemaker-Medtronic   Daytime somnolence   Essential hypertension   Hyperlipidemia   AKI (acute kidney injury) (HCC)   Loss of weight   Hypokalemia   Low blood magnesium   Vomiting   Cough   Ileus (HCC)   Malnutrition of moderate degree     LOS: 1 day   Tye Savoy ,NP 07/16/2018, 9:10 AM     Attending physician's note   I have taken an interval history, reviewed the chart and examined the patient. I agree with the Advanced Practitioner's note, impression and recommendations.   85 year old with nausea/vomiting/regurgitation immediately after taking PO.  EGD 3/17 showed 10 mm ulcer at GE junction (highly suspicious of malignancy).  Bx pending. Head CT negative for CVA.  Esophagogram 06/28/2018 neg. Plan: Follow Bx, MBS, round-the-clock antiemetics. Will follow along for now.   Carmell Austria, MD

## 2018-07-16 NOTE — Progress Notes (Signed)
Benign biopsies Patient in hospital

## 2018-07-16 NOTE — Progress Notes (Signed)
Physical Therapy Treatment Patient Details Name: [name] MRN: [id number] DOB: 30-Apr-1934 Today's Date: 07/16/2018    History of Present Illness Pt is a 85 y.o. female admitted 3/15 with nausea, vomiting, and weakness. Pt had been at [facility] previously after a fall resulting in rhabdomyolysis and acute on chronic CKD. Pt had been home one day, then returned to ED for this hospitalization. PMH includes pacemaker, HTN, CVAx3 with L weakness per pt report, CHF, HLD.    PT Comments    Pt with improved ambulation distance today, still presenting with shortened step length and difficulty with sustaining upright posture with ambulation when fatigued. Pt tolerated LE exercises well, but required 3 minute rest break between ambulation and LE exercises. Pt disheartened this session, as she still cannot eat without becoming nauseous and throwing up. PT to continue to follow acutely.    Follow Up Recommendations  Home health PT;Supervision for mobility/OOB     Equipment Recommendations  None recommended by PT    Recommendations for Other Services       Precautions / Restrictions Precautions Precautions: Fall;ICD/Pacemaker Restrictions Weight Bearing Restrictions: No    Mobility  Bed Mobility Overal bed mobility: Needs Assistance             General bed mobility comments: Pt up in chair upon arrival and requesting to stay in chair upon PT exit.   Transfers Overall transfer level: Needs assistance Equipment used: Rolling walker (2 wheeled) Transfers: Sit to/from Stand Sit to Stand: Min guard         General transfer comment: Min guard for safety, increased time to come to full standing.   Ambulation/Gait Ambulation/Gait assistance: Min guard;Supervision Gait Distance (Feet): 250 Feet Assistive device: Rolling walker (2 wheeled) Gait Pattern/deviations: Step-through pattern;Trendelenburg;Decreased stride length;Trunk flexed Gait velocity: decr    General Gait  Details: Min guard for supervision for safety. Verbal cuing for upright posture x2 during session, pt with tendency for forward flex at trunk. Additional verbal cuing for placement inside RW, pt with tendency to be outside of RW with fatigue.    Stairs             Wheelchair Mobility    Modified Rankin (Stroke Patients Only)       Balance Overall balance assessment: Needs assistance;History of Falls Sitting-balance support: No upper extremity supported Sitting balance-Leahy Scale: Good     Standing balance support: Bilateral upper extremity supported Standing balance-Leahy Scale: Poor Standing balance comment: reliant on UE support                            Cognition Arousal/Alertness: Awake/alert Behavior During Therapy: WFL for tasks assessed/performed Overall Cognitive Status: Within Functional Limits for tasks assessed                                 General Comments: Pt reports feeling down today because she still cannot eat without throwing up. Pt feels disheartened, and feels that she is no closer to receiving diagnosis at this time.       Exercises General Exercises - Lower Extremity Ankle Circles/Pumps: AROM;Both;10 reps;Seated Long Arc Quad: AROM;Both;Seated;10 reps Heel Slides: AROM;Both;10 reps;Seated;AAROM Hip ABduction/ADduction: AROM;Both;10 reps;Seated Hip Flexion/Marching: AROM;AAROM;Both;10 reps;Seated    General Comments        Pertinent Vitals/Pain Pain Assessment: 0-10 Pain Score: 4  Pain Location: low back  Pain Descriptors / Indicators:  Discomfort Pain Intervention(s): Limited activity within patient's tolerance;Monitored during session;Repositioned    Home Living                      Prior Function            PT Goals (current goals can now be found in the care plan section) Acute Rehab PT Goals Patient Stated Goal: be independent as long as possible PT Goal Formulation: With patient Time For  Goal Achievement: 07/29/18 Potential to Achieve Goals: Good Progress towards PT goals: Progressing toward goals    Frequency    Min 3X/week      PT Plan Current plan remains appropriate    Co-evaluation              AM-PAC PT "6 Clicks" Mobility   Outcome Measure  Help needed turning from your back to your side while in a flat bed without using bedrails?: A Little Help needed moving from lying on your back to sitting on the side of a flat bed without using bedrails?: A Little Help needed moving to and from a bed to a chair (including a wheelchair)?: A Little Help needed standing up from a chair using your arms (e.g., wheelchair or bedside chair)?: A Little Help needed to walk in hospital room?: A Little Help needed climbing 3-5 steps with a railing? : A Little 6 Click Score: 18    End of Session Equipment Utilized During Treatment: Gait belt Activity Tolerance: Patient tolerated treatment well;Patient limited by fatigue Patient left: in chair;with call bell/phone within reach;with chair alarm set Nurse Communication: Other (comment)(unable to locate RN post-session ) PT Visit Diagnosis: Other abnormalities of gait and mobility (R26.89);Difficulty in walking, not elsewhere classified (R26.2);History of falling (Z91.81)     Time: 6720-9470 PT Time Calculation (min) (ACUTE ONLY): 26 min  Charges:  $Gait Training: 8-22 mins $Therapeutic Exercise: 8-22 mins                     Julien Girt, PT Acute Rehabilitation Services Pager [phone]  Office [place] 07/16/2018, 1:06 PM

## 2018-07-16 NOTE — Progress Notes (Signed)
Remote pacemaker transmission.   

## 2018-07-16 NOTE — Progress Notes (Signed)
Modified Barium Swallow Progress Note  Patient Details  Name: [name] MRN: [id number] Date of Birth: 24-Aug-1933  Today's Date: 07/16/2018  Modified Barium Swallow completed.  Full report located under Chart Review in the Imaging Section.  Brief recommendations include the following:  Clinical Impression  Pt presented with minimal delay in transiting pudding (5 seconds with lingual rocking) and with oral retention of barium tablet with liquid *pt needed 2nd bolus of thin to transit tablet.  Suspect pt's oral transit delays with solids may be pt conditioned response to her complaint of nausea followed by gagging when swallowing.  She piecemealed puree/solids boluses which was functional for her.  Pharyngeal swallow is timely and Allison with NO aspiration or laryngeal penetration.  No oropharyngeal deficits present to account for pt's complaint of nausea followed by gagging and coughing.  Please note, pt did state she was nauseated after MBS - followed by belch and minimal expectoration of thin secretions/barium (less than 1/4 tsp).  This was not replicated further and pt stated "I think I'm ok".  Xray turned back on after pt's event and no barium observed in pharynx.  SLP will follow up x1 to encourage pt and educate her re: her functional oropharyngeal swallow.     Swallow Evaluation Recommendations       SLP Diet Recommendations: Other (Comment)(defer diet to GI/MD, re oropharyngeal swallow rec regular/thin)   Liquid Administration via: Cup;Straw   Medication Administration: Whole meds with liquid  Start with water to assure oral cavity is moist    Supervision: Patient able to self feed   Compensations: Minimize environmental distractions;Slow rate;Small sips/bites   Postural Changes: Seated upright at 90 degrees;Remain semi-upright after after feeds/meals (Comment)   Oral Care Recommendations: Oral care BID      Luanna Salk, MS [facility] SLP Acute Rehab Services Pager  [phone] Office [phone]   Macario Golds 07/16/2018,6:23 PM

## 2018-07-17 LAB — CBC
HCT: 34.4 % — ABNORMAL LOW (ref 36.0–46.0)
Hemoglobin: 9.9 g/dL — ABNORMAL LOW (ref 12.0–15.0)
MCH: 26.7 pg (ref 26.0–34.0)
MCHC: 28.8 g/dL — ABNORMAL LOW (ref 30.0–36.0)
MCV: 92.7 fL (ref 80.0–100.0)
NRBC: 0 % (ref 0.0–0.2)
Platelets: 240 10*3/uL (ref 150–400)
RBC: 3.71 MIL/uL — AB (ref 3.87–5.11)
RDW: 16.5 % — ABNORMAL HIGH (ref 11.5–15.5)
WBC: 6.6 10*3/uL (ref 4.0–10.5)

## 2018-07-17 MED ORDER — METOCLOPRAMIDE HCL 5 MG/ML IJ SOLN
5.0000 mg | Freq: Three times a day (TID) | INTRAMUSCULAR | Status: DC
  Administered 2018-07-17 – 2018-07-20 (×11): 5 mg via INTRAVENOUS
  Filled 2018-07-17 (×11): qty 2

## 2018-07-17 MED ORDER — METOCLOPRAMIDE HCL 5 MG/ML IJ SOLN: 10.0000 mg | Freq: Three times a day (TID) | INTRAMUSCULAR | Status: DC

## 2018-07-17 NOTE — Progress Notes (Addendum)
Progress Note   Subjective  Chief Complaint: Nausea/vomiting  Today, patient reports that she feels the same.  She is barely able to drink clear liquids as they sometimes make her nauseous as well.  No new changes.   Objective   Vital signs in last 24 hours: Temp:  [98.4 F (36.9 C)-99.5 F (37.5 C)] 99.5 F (37.5 C) (03/19 0449) Pulse Rate:  [74-80] 80 (03/19 0449) Resp:  [18-20] 18 (03/19 0449) BP: (128-134)/(71-82) 134/82 (03/19 0449) SpO2:  [97 %-98 %] 98 % (03/19 0449) Weight:  [89.2 kg] 89.2 kg (03/19 0500) Last BM Date: 07/16/18 General:    AA female in NAD Heart:  Regular rate and rhythm; no murmurs Lungs: Respirations even and unlabored, lungs CTA bilaterally Abdomen:  Soft, nontender and nondistended. Normal bowel sounds. Extremities:  Without edema. Neurologic:  Alert and oriented,  grossly normal neurologically. Psych:  Cooperative. Normal mood and affect.  Intake/Output from previous day: 03/18 0701 - 03/19 0700 In: 1080 [P.O.:180; I.V.:900] Out: -   Lab Results: Recent Labs    07/15/18 0358 07/16/18 0321 07/17/18 0355  WBC 6.8 6.0 6.6  HGB 9.8* 9.4* 9.9*  HCT 33.0* 32.1* 34.4*  PLT 223 219 240   BMET Recent Labs    07/15/18 0358 07/16/18 0321  NA 145 144  K 3.3* 4.2  CL 116* 117*  CO2 21* 20*  GLUCOSE 78 95  BUN 12 10  CREATININE 0.72 0.71  CALCIUM 7.8* 7.8*    Studies/Results: Ct Head Wo Contrast  Result Date: 07/15/2018 CLINICAL DATA:  Onset difficulty swallowing today. EXAM: CT HEAD WITHOUT CONTRAST TECHNIQUE: Contiguous axial images were obtained from the base of the skull through the vertex without intravenous contrast. COMPARISON:  Head CT scan 05/26/2018 and 05/27/2018. FINDINGS: Brain: No evidence of acute infarction, hemorrhage, hydrocephalus, extra-axial collection or mass lesion/mass effect. Atrophy, chronic microvascular ischemic change remote basal ganglia lacunar infarctions on the right noted. Small left cerebellar  infarct is also again seen. Vascular: No hyperdense vessel or unexpected calcification. Skull: Intact. Sinuses/Orbits: Status post cataract surgery.  Otherwise negative. Other: None. IMPRESSION: No acute abnormality. No change in atrophy, chronic microvascular ischemic disease small infarcts. Electronically Signed   By: Inge Rise M.D.   On: 07/15/2018 16:52   Dg Swallowing Func-speech Pathology  Result Date: 07/16/2018 Objective Swallowing Evaluation: Type of Study: MBS-Modified Barium Swallow Study  Patient Details Name: [name] MRN: [id number] Date of Birth: [birth year] Today's Date: 07/16/2018 Time: SLP Start Time (ACUTE ONLY): 1500 -SLP Stop Time (ACUTE ONLY): 1525 SLP Time Calculation (min) (ACUTE ONLY): 25 min Past Medical History: Past Medical History: Diagnosis Date . Complete heart block ([place])  . Gait abnormality 07/23/2016 . History of hypertension  . History of stroke  . Hyperhomocysteinemia ([place])  . Hyperlipidemia  . Hypertension  . Hypokalemia  . Hypokalemia 07/13/2018 . Low blood magnesium 07/13/2018 . Pacemaker-Medtronic 08/07/2011 . Vomiting 07/13/2018 Past Surgical History: Past Surgical History: Procedure Laterality Date . BIOPSY  07/15/2018  Procedure: BIOPSY;  Surgeon: [name], MD;  Location: [facility];  Service: Gastroenterology;; . CARDIAC CATHETERIZATION   . ESOPHAGOGASTRODUODENOSCOPY Left 07/15/2018  Procedure: ESOPHAGOGASTRODUODENOSCOPY (EGD);  Surgeon: [name], MD;  Location: [facility];  Service: Gastroenterology;  Laterality: Left; . PACEMAKER INSERTION    Medtronic Enpulse dual-chamber pacemaker . PERMANENT PACEMAKER GENERATOR CHANGE N/A 07/30/2013  Procedure: PERMANENT PACEMAKER GENERATOR CHANGE;  Surgeon: [name], MD;  Location: [facility] CATH LAB;  Service: Cardiovascular;  Laterality: N/A; HPI:  85 yo female adm to [facility] with nausea/vomiting with intake, FTT.  Pt recently admitted to hospital after found down at home for 2 days - diagnosed with rhabdomylosis  and required SNF stay.  She had been at [facility] for Rehab and was dc'd home.  Pt resides alone and reports dysphagia since rhabdomylosis episode.    She reports 30 pound weight loss since last hospital admission.  Pt has undergone esophagram 06/28/2018 showing minimal distal esophageal spasm, no stricture nor mass and small hiatal hernia.  Today EGD completed that showed non bleeding ulcer, tortuous esophagus and small hiatal hernia.  Pt has prior CVAs that caused her left sided weakness but did not cause swallowing deficits.  Pt denies problems swallowing, stating she can swallow fine but then feels nauseated and it "turns around" and comes back up. Pt underwent BSE yesterday and no indication of oropharyngeal dysphagia noted but GI MD desired to rule out definitively oropharyngeal dysphagia via MBS.   Subjective: pt awake in chair Assessment / Plan / Recommendation CHL IP CLINICAL IMPRESSIONS 07/16/2018 Clinical Impression Pt presented with minimal delay in transiting pudding (5 seconds with lingual rocking) and with oral retention of barium tablet with liquid *pt needed 2nd bolus of thin to transit tablet.  Suspect pt's oral transit delays with solids may be pt conditioned response to her complaint of nausea followed by gagging when swallowing.  She piecemealed puree/solids boluses which was functional for her.  Pharyngeal swallow is timely and strong with NO aspiration or laryngeal penetration.  No oropharyngeal deficits present to account for pt's complaint of nausea followed by gagging and coughing.  Please note, pt did state she was nauseated after MBS - followed by belch and minimal expectoration of thin secretions/barium (less than 1/4 tsp).  This was not replicated further and pt stated "I think I'm ok".  Xray turned back on after pt's event and no barium observed in pharynx.  SLP will follow up x1 to encourage pt and educate her re: her functional oropharyngeal swallow.   SLP Visit Diagnosis Dysphagia,  unspecified (R13.10) Attention and concentration deficit following -- Frontal lobe and executive function deficit following -- Impact on safety and function No limitations   CHL IP TREATMENT RECOMMENDATION 07/16/2018 Treatment Recommendations No treatment recommended at this time   Prognosis 07/16/2018 Prognosis for Safe Diet Advancement Good Barriers to Reach Goals -- Barriers/Prognosis Comment -- CHL IP DIET RECOMMENDATION 07/16/2018 SLP Diet Recommendations Other (Comment) Liquid Administration via Cup;Straw Medication Administration Whole meds with liquid Compensations Minimize environmental distractions;Slow rate;Small sips/bites Postural Changes Seated upright at 90 degrees;Remain semi-upright after after feeds/meals (Comment)   CHL IP OTHER RECOMMENDATIONS 07/16/2018 Recommended Consults -- Oral Care Recommendations Oral care BID Other Recommendations --   CHL IP FOLLOW UP RECOMMENDATIONS 07/16/2018 Follow up Recommendations None   CHL IP FREQUENCY AND DURATION 07/16/2018 Speech Therapy Frequency (ACUTE ONLY) min 1 x/week Treatment Duration 1 week      CHL IP ORAL PHASE 07/16/2018 Oral Phase Impaired Oral - Pudding Teaspoon -- Oral - Pudding Cup -- Oral - Honey Teaspoon -- Oral - Honey Cup -- Oral - Nectar Teaspoon WFL Oral - Nectar Cup WFL Oral - Nectar Straw -- Oral - Thin Teaspoon WFL Oral - Thin Cup WFL Oral - Thin Straw WFL Oral - Puree Piecemeal swallowing;Delayed oral transit Oral - Mech Soft Piecemeal swallowing Oral - Regular -- Oral - Multi-Consistency -- Oral - Pill Delayed oral transit;Weak lingual manipulation;Other (Comment);Decreased bolus cohesion Oral Phase - Comment mild oral  transit delays with pudding - when inquired "why" pt states " I guess because I have problems"  CHL IP PHARYNGEAL PHASE 07/16/2018 Pharyngeal Phase WFL Pharyngeal- Pudding Teaspoon -- Pharyngeal -- Pharyngeal- Pudding Cup -- Pharyngeal -- Pharyngeal- Honey Teaspoon -- Pharyngeal -- Pharyngeal- Honey Cup -- Pharyngeal --  Pharyngeal- Nectar Teaspoon WFL Pharyngeal -- Pharyngeal- Nectar Cup WFL Pharyngeal -- Pharyngeal- Nectar Straw -- Pharyngeal -- Pharyngeal- Thin Teaspoon WFL Pharyngeal -- Pharyngeal- Thin Cup WFL Pharyngeal -- Pharyngeal- Thin Straw WFL Pharyngeal -- Pharyngeal- Puree WFL Pharyngeal -- Pharyngeal- Mechanical Soft WFL Pharyngeal -- Pharyngeal- Regular -- Pharyngeal -- Pharyngeal- Multi-consistency -- Pharyngeal -- Pharyngeal- Pill WFL Pharyngeal -- Pharyngeal Comment --  CHL IP CERVICAL ESOPHAGEAL PHASE 07/16/2018 Cervical Esophageal Phase WFL Pudding Teaspoon -- Pudding Cup -- Honey Teaspoon -- Honey Cup -- Nectar Teaspoon -- Nectar Cup -- Nectar Straw -- Thin Teaspoon -- Thin Cup -- Thin Straw -- Puree -- Mechanical Soft -- Regular -- Multi-consistency -- Pill -- Cervical Esophageal Comment -- Macario Golds 07/16/2018, 6:24 PM  Luanna Salk, MS [facility] SLP Acute Rehab Services Pager [phone] Office [phone]                Assessment / Plan:   Assessment: 72.  85 year old female with nausea and vomiting immediately after taking p.o.  EGD 07/15/2018 with a nonbleeding, cratered 10 mm ulcer at the GE junction, biopsies benign.  M BSS yesterday with some oropharyngeal slowness, they plan to follow today, but no real cause for nausea/vomiting -Head CT scan negative for acute abnormalities -Patient feels some better on scheduled Zofran -Continue IV PPI and Carafate  2.  Mild [place] anemia, hemoglobin stable overnight  Please await any further recommendations from Dr. Fuller Plan later today.   LOS: 2 days   Levin Erp  07/17/2018, 9:15 AM     Attending Physician Note   I have taken an interval history, reviewed the chart and examined the patient. I agree with the Advanced Practitioner's note, impression and recommendations. Adding Reglan RTC in addition to Zofran RTC.   Lucio Edward, MD FACG [phone]

## 2018-07-17 NOTE — Progress Notes (Signed)
Pt having increased number of loose, mucous like stools. 5 in last 24 hours. MD aware.  Will continue to monitor.

## 2018-07-17 NOTE — Progress Notes (Signed)
PROGRESS NOTE    [name]  VQQ:[id number] DOB: 08-16-1933 DOA: 07/13/2018 PCP: Randel Books, FNP   Brief Narrative: [name] is a 85 y.o. female with a history of complete heart block s/p pacemaker, hyperlipidemia, CKD stage III, stroke. She presented secondary to recurrent/intractable nausea and vomiting.   Assessment & Plan:   Active Problems:   HYPERTENSION, HEART CONTROLLED W/O ASSOC CHF   AV BLOCK, COMPLETE   Pacemaker-Medtronic   Daytime somnolence   Essential hypertension   Hyperlipidemia   AKI (acute kidney injury) (HCC)   Loss of weight   Hypokalemia   Low blood magnesium   Vomiting   Cough   Ileus (HCC)   Malnutrition of moderate degree   Intractable nausea/vomiting Unknown etiology. Possibly esophageal. S/p EGD on 3/17 significant for 2 gastric ulcers. History of strokes. CT head negative for acute stroke. No new neurologic symptoms. -GI recommendations: PPI, carafate, scheduled zofran. Reglan added today -Encouraging patient to attempt oral intake -Continue IV fluids  Complete heart block S/p pacemaker. Stable.  History of stroke -Continue aspirin and Plavix  Hyperlipidemia -Continue Zocor  Hypokalemia/hypomagnesemia Secondary to vomiting. Supplementation given. Stable  Fever Unknown etiology. No source. Resolved.  Anemia Acute. Unknown etiology. Stable.  CKD stage III Stable.   DVT prophylaxis: Lovenox Code Status:   Code Status: Full Code Family Communication: None at bedside Disposition Plan: Discharge pending GI management   Consultants:   Gastroenterology  Procedures:   EGD (07/15/2018) Impression:       - Non-bleeding esophageal ulcer. Biopsied. 2 pieces                            from the edge of this black discolored ulcer crater                           - Small hiatal hernia.                           - Tortuous esophagus.                           - The examination was otherwise  normal.  Recommendation:                                      - Continue present medications.                           - Clear liquid diet.                           - make PPI bid                           add sucralfate                           add reflux precautions                           consider CNS eval, additional strokes as a cause of  nause and vomiting                           Hard to say if this ulcer is a cause or a result                            (favor latter) of process                           No evidence for an obstructive process - I suppose                            she could be having dysphagia interpreted as                            vomiting and then this ulcer and perhaps a spastic                            esophagus might be the cause                           we will f/u  Antimicrobials:  None    Subjective: Has not been as nauseated but still has spitting up. She did, however, keep the barium contrast material down.  Objective: Vitals:   07/16/18 2119 07/17/18 0449 07/17/18 0500 07/17/18 1229  BP: 128/74 134/82  135/84  Pulse: 74 80  68  Resp: 18 18  (!) 22  Temp: 98.6 F (37 C) 99.5 F (37.5 C)  98.3 F (36.8 C)  TempSrc: Oral Oral  Oral  SpO2: 97% 98%  99%  Weight:   89.2 kg   Height:        Intake/Output Summary (Last 24 hours) at 07/17/2018 1251 Last data filed at 07/17/2018 0518 Gross per 24 hour  Intake 1020 ml  Output -  Net 1020 ml   Filed Weights   07/15/18 0550 07/16/18 0502 07/17/18 0500  Weight: 87 kg 89.6 kg 89.2 kg    Examination:  General exam: Appears calm and comfortable Respiratory system: Clear to auscultation. Respiratory effort normal. Cardiovascular system: S1 & S2 heard, RRR. No murmurs, rubs, gallops or clicks. Gastrointestinal system: Abdomen is nondistended, soft and nontender. No organomegaly or masses felt. Normal bowel sounds heard. Central nervous system: Alert and  oriented. No focal neurological deficits. Extremities: No edema. No calf tenderness Skin: No cyanosis. No rashes Psychiatry: Judgement and insight appear normal. Mood & affect appropriate.    Data Reviewed: I have personally reviewed following labs and imaging studies  CBC: Recent Labs  Lab 07/13/18 1420 07/14/18 0249 07/15/18 0358 07/16/18 0321 07/17/18 0355  WBC 8.7 8.7 6.8 6.0 6.6  NEUTROABS 6.6  --   --   --   --   HGB 10.7* 10.4* 9.8* 9.4* 9.9*  HCT 36.0 35.8* 33.0* 32.1* 34.4*  MCV 90.5 94.5 91.4 92.0 92.7  PLT 266 122* 223 219 778   Basic Metabolic Panel: Recent Labs  Lab 07/13/18 1420 07/13/18 1630 07/13/18 2037 07/14/18 0249 07/15/18 0358 07/16/18 0321  NA 141  --  141 143 145 144  K 2.8*  --  3.5 3.7 3.3* 4.2  CL 108  --  112* 113* 116*  117*  CO2 25  --  22 19* 21* 20*  GLUCOSE 89  --  96 83 78 95  BUN 11  --  10 9 12 10   CREATININE 0.75  --  0.74 0.77 0.72 0.71  CALCIUM 7.9*  --  7.4* 7.7* 7.8* 7.8*  MG  --  1.4*  --  2.0 1.8  --    GFR: Estimated Creatinine Clearance: 56.2 mL/min (by C-G formula based on SCr of 0.71 mg/dL). Liver Function Tests: Recent Labs  Lab 07/13/18 1420 07/13/18 2037  AST 13* 12*  ALT 8 8  ALKPHOS 57 51  BILITOT 0.8 0.9  PROT 6.6 6.1*  ALBUMIN 2.7* 2.5*   No results for input(s): LIPASE, AMYLASE in the last 168 hours. No results for input(s): AMMONIA in the last 168 hours. Coagulation Profile: Recent Labs  Lab 07/14/18 0249  INR 1.2   Cardiac Enzymes: Recent Labs  Lab 07/14/18 0249 07/14/18 0729 07/14/18 1349  TROPONINI 0.03* <0.03 <0.03   BNP (last 3 results) No results for input(s): PROBNP in the last 8760 hours. HbA1C: No results for input(s): HGBA1C in the last 72 hours. CBG: Recent Labs  Lab 07/14/18 0819  GLUCAP 89   Lipid Profile: No results for input(s): CHOL, HDL, LDLCALC, TRIG, CHOLHDL, LDLDIRECT in the last 72 hours. Thyroid Function Tests: No results for input(s): TSH, T4TOTAL, FREET4,  T3FREE, THYROIDAB in the last 72 hours. Anemia Panel: No results for input(s): VITAMINB12, FOLATE, FERRITIN, TIBC, IRON, RETICCTPCT in the last 72 hours. Sepsis Labs: Recent Labs  Lab 07/13/18 1600  LATICACIDVEN 0.9    Recent Results (from the past 240 hour(s))  Urine Culture     Status: Abnormal   Collection Time: 07/13/18  2:20 PM  Result Value Ref Range Status   Specimen Description   Final    URINE, RANDOM Performed at [facility] [address], [place], [place] [address]    Special Requests   Final    NONE Performed at [facility], [place] [address], [place], [place] [address]    Culture MULTIPLE SPECIES PRESENT, SUGGEST RECOLLECTION (A)  Final   Report Status 07/14/2018 FINAL  Final         Radiology Studies: Ct Head Wo Contrast  Result Date: 07/15/2018 CLINICAL DATA:  Onset difficulty swallowing today. EXAM: CT HEAD WITHOUT CONTRAST TECHNIQUE: Contiguous axial images were obtained from the base of the skull through the vertex without intravenous contrast. COMPARISON:  Head CT scan 05/26/2018 and 05/27/2018. FINDINGS: Brain: No evidence of acute infarction, hemorrhage, hydrocephalus, extra-axial collection or mass lesion/mass effect. Atrophy, chronic microvascular ischemic change remote basal ganglia lacunar infarctions on the right noted. Small left cerebellar infarct is also again seen. Vascular: No hyperdense vessel or unexpected calcification. Skull: Intact. Sinuses/Orbits: Status post cataract surgery.  Otherwise negative. Other: None. IMPRESSION: No acute abnormality. No change in atrophy, chronic microvascular ischemic disease small infarcts. Electronically Signed   By: Inge Rise M.D.   On: 07/15/2018 16:52   Dg Swallowing Func-speech Pathology  Result Date: 07/16/2018 Objective Swallowing Evaluation: Type of Study: MBS-Modified Barium Swallow Study  Patient Details Name: [name] MRN: [id number] Date of Birth:  1934/03/01 Today's Date: 07/16/2018 Time: SLP Start Time (ACUTE ONLY): 1500 -SLP Stop Time (ACUTE ONLY): 1525 SLP Time Calculation (min) (ACUTE ONLY): 25 min Past Medical History: Past Medical History: Diagnosis Date . Complete heart block ([place])  . Gait abnormality 07/23/2016 . History of hypertension  . History of stroke  .  Hyperhomocysteinemia ([place])  . Hyperlipidemia  . Hypertension  . Hypokalemia  . Hypokalemia 07/13/2018 . Low blood magnesium 07/13/2018 . Pacemaker-Medtronic 08/07/2011 . Vomiting 07/13/2018 Past Surgical History: Past Surgical History: Procedure Laterality Date . BIOPSY  07/15/2018  Procedure: BIOPSY;  Surgeon: [name], MD;  Location: [facility];  Service: Gastroenterology;; . CARDIAC CATHETERIZATION   . ESOPHAGOGASTRODUODENOSCOPY Left 07/15/2018  Procedure: ESOPHAGOGASTRODUODENOSCOPY (EGD);  Surgeon: [name], MD;  Location: [facility];  Service: Gastroenterology;  Laterality: Left; . PACEMAKER INSERTION    Medtronic Enpulse dual-chamber pacemaker . PERMANENT PACEMAKER GENERATOR CHANGE N/A 07/30/2013  Procedure: PERMANENT PACEMAKER GENERATOR CHANGE;  Surgeon: [name], MD;  Location: [facility] CATH LAB;  Service: Cardiovascular;  Laterality: N/A; HPI: 85 yo female adm to [facility] with nausea/vomiting with intake, FTT.  Pt recently admitted to hospital after found down at home for 2 days - diagnosed with rhabdomylosis and required SNF stay.  She had been at [facility] for Rehab and was dc'd home.  Pt resides alone and reports dysphagia since rhabdomylosis episode.    She reports 30 pound weight loss since last hospital admission.  Pt has undergone esophagram 06/28/2018 showing minimal distal esophageal spasm, no stricture nor mass and small hiatal hernia.  Today EGD completed that showed non bleeding ulcer, tortuous esophagus and small hiatal hernia.  Pt has prior CVAs that caused her left sided weakness but did not cause swallowing deficits.  Pt denies problems swallowing, stating she can swallow  fine but then feels nauseated and it "turns around" and comes back up. Pt underwent BSE yesterday and no indication of oropharyngeal dysphagia noted but GI MD desired to rule out definitively oropharyngeal dysphagia via MBS.   Subjective: pt awake in chair Assessment / Plan / Recommendation CHL IP CLINICAL IMPRESSIONS 07/16/2018 Clinical Impression Pt presented with minimal delay in transiting pudding (5 seconds with lingual rocking) and with oral retention of barium tablet with liquid *pt needed 2nd bolus of thin to transit tablet.  Suspect pt's oral transit delays with solids may be pt conditioned response to her complaint of nausea followed by gagging when swallowing.  She piecemealed puree/solids boluses which was functional for her.  Pharyngeal swallow is timely and strong with NO aspiration or laryngeal penetration.  No oropharyngeal deficits present to account for pt's complaint of nausea followed by gagging and coughing.  Please note, pt did state she was nauseated after MBS - followed by belch and minimal expectoration of thin secretions/barium (less than 1/4 tsp).  This was not replicated further and pt stated "I think I'm ok".  Xray turned back on after pt's event and no barium observed in pharynx.  SLP will follow up x1 to encourage pt and educate her re: her functional oropharyngeal swallow.   SLP Visit Diagnosis Dysphagia, unspecified (R13.10) Attention and concentration deficit following -- Frontal lobe and executive function deficit following -- Impact on safety and function No limitations   CHL IP TREATMENT RECOMMENDATION 07/16/2018 Treatment Recommendations No treatment recommended at this time   Prognosis 07/16/2018 Prognosis for Safe Diet Advancement Good Barriers to Reach Goals -- Barriers/Prognosis Comment -- CHL IP DIET RECOMMENDATION 07/16/2018 SLP Diet Recommendations Other (Comment) Liquid Administration via Cup;Straw Medication Administration Whole meds with liquid Compensations Minimize  environmental distractions;Slow rate;Small sips/bites Postural Changes Seated upright at 90 degrees;Remain semi-upright after after feeds/meals (Comment)   CHL IP OTHER RECOMMENDATIONS 07/16/2018 Recommended Consults -- Oral Care Recommendations Oral care BID Other Recommendations --   CHL IP FOLLOW UP RECOMMENDATIONS 07/16/2018  Follow up Recommendations None   CHL IP FREQUENCY AND DURATION 07/16/2018 Speech Therapy Frequency (ACUTE ONLY) min 1 x/week Treatment Duration 1 week      CHL IP ORAL PHASE 07/16/2018 Oral Phase Impaired Oral - Pudding Teaspoon -- Oral - Pudding Cup -- Oral - Honey Teaspoon -- Oral - Honey Cup -- Oral - Nectar Teaspoon WFL Oral - Nectar Cup WFL Oral - Nectar Straw -- Oral - Thin Teaspoon WFL Oral - Thin Cup WFL Oral - Thin Straw WFL Oral - Puree Piecemeal swallowing;Delayed oral transit Oral - Mech Soft Piecemeal swallowing Oral - Regular -- Oral - Multi-Consistency -- Oral - Pill Delayed oral transit;Weak lingual manipulation;Other (Comment);Decreased bolus cohesion Oral Phase - Comment mild oral transit delays with pudding - when inquired "why" pt states " I guess because I have problems"  CHL IP PHARYNGEAL PHASE 07/16/2018 Pharyngeal Phase WFL Pharyngeal- Pudding Teaspoon -- Pharyngeal -- Pharyngeal- Pudding Cup -- Pharyngeal -- Pharyngeal- Honey Teaspoon -- Pharyngeal -- Pharyngeal- Honey Cup -- Pharyngeal -- Pharyngeal- Nectar Teaspoon WFL Pharyngeal -- Pharyngeal- Nectar Cup WFL Pharyngeal -- Pharyngeal- Nectar Straw -- Pharyngeal -- Pharyngeal- Thin Teaspoon WFL Pharyngeal -- Pharyngeal- Thin Cup WFL Pharyngeal -- Pharyngeal- Thin Straw WFL Pharyngeal -- Pharyngeal- Puree WFL Pharyngeal -- Pharyngeal- Mechanical Soft WFL Pharyngeal -- Pharyngeal- Regular -- Pharyngeal -- Pharyngeal- Multi-consistency -- Pharyngeal -- Pharyngeal- Pill WFL Pharyngeal -- Pharyngeal Comment --  CHL IP CERVICAL ESOPHAGEAL PHASE 07/16/2018 Cervical Esophageal Phase WFL Pudding Teaspoon -- Pudding Cup -- Honey  Teaspoon -- Honey Cup -- Nectar Teaspoon -- Nectar Cup -- Nectar Straw -- Thin Teaspoon -- Thin Cup -- Thin Straw -- Puree -- Mechanical Soft -- Regular -- Multi-consistency -- Pill -- Cervical Esophageal Comment -- Macario Golds 07/16/2018, 6:24 PM  Luanna Salk, MS [facility] SLP Acute Rehab Services Pager [phone] Office [phone]                  Scheduled Meds: . acetaminophen  650 mg Oral Once  . aspirin EC  81 mg Oral Daily  . clopidogrel  75 mg Oral Q breakfast  . enoxaparin (LOVENOX) injection  40 mg Subcutaneous Q24H  . feeding supplement (ENSURE ENLIVE)  237 mL Oral BID BM  . latanoprost  1 drop Both Eyes QHS  . metoCLOPramide (REGLAN) injection  5 mg Intravenous TID AC  . ondansetron (ZOFRAN) IV  4 mg Intravenous Q8H  . pantoprazole (PROTONIX) IV  40 mg Intravenous Q12H  . simvastatin  40 mg Oral QHS  . sucralfate  1 g Oral TID WC & HS   Continuous Infusions: . sodium chloride 20 mL/hr at 07/15/18 0600  . sodium chloride 75 mL/hr at 07/15/18 0724  . dextrose 5 % and 0.9 % NaCl with KCl 40 mEq/L 75 mL/hr at 07/17/18 0512     LOS: 2 days     Cordelia Poche, MD [facility] 07/17/2018, 12:51 PM  If 7PM-7AM, please contact night-coverage [email or web address]

## 2018-07-18 LAB — BASIC METABOLIC PANEL
Anion gap: 5 (ref 5–15)
BUN: 8 mg/dL (ref 8–23)
CO2: 21 mmol/L — AB (ref 22–32)
Calcium: 8.1 mg/dL — ABNORMAL LOW (ref 8.9–10.3)
Chloride: 118 mmol/L — ABNORMAL HIGH (ref 98–111)
Creatinine, Ser: 0.91 mg/dL (ref 0.44–1.00)
GFR calc Af Amer: >60 mL/min (ref >60)
GFR calc non Af Amer: 59 mL/min — ABNORMAL LOW (ref >60)
GLUCOSE: 102 mg/dL — AB (ref 70–99)
Potassium: 4.1 mmol/L (ref 3.5–5.1)
Sodium: 144 mmol/L (ref 135–145)

## 2018-07-18 LAB — CBC
HEMATOCRIT: 34.1 % — AB (ref 36.0–46.0)
Hemoglobin: 9.6 g/dL — ABNORMAL LOW (ref 12.0–15.0)
MCH: 27 pg (ref 26.0–34.0)
MCHC: 28.2 g/dL — ABNORMAL LOW (ref 30.0–36.0)
MCV: 96.1 fL (ref 80.0–100.0)
Platelets: 218 10*3/uL (ref 150–400)
RBC: 3.55 MIL/uL — ABNORMAL LOW (ref 3.87–5.11)
RDW: 16.7 % — ABNORMAL HIGH (ref 11.5–15.5)
WBC: 8 10*3/uL (ref 4.0–10.5)
nRBC: 0 % (ref 0.0–0.2)

## 2018-07-18 MED ORDER — BOOST / RESOURCE BREEZE PO LIQD CUSTOM
1.0000 | Freq: Three times a day (TID) | ORAL | Status: DC
  Administered 2018-07-18 – 2018-07-21 (×4): 1 via ORAL

## 2018-07-18 MED ORDER — SODIUM CHLORIDE 0.9 % IV SOLN
8.0000 mg | Freq: Three times a day (TID) | INTRAVENOUS | Status: AC
  Administered 2018-07-18 – 2018-07-20 (×6): 8 mg via INTRAVENOUS
  Filled 2018-07-18 (×6): qty 4

## 2018-07-18 NOTE — Progress Notes (Signed)
PROGRESS NOTE    [name]  TIR:[id number] DOB: [birth year] DOA: 07/13/2018 PCP: Randel Books, FNP   Brief Narrative: [name] is a 85 y.o. female with a history of complete heart block s/p pacemaker, hyperlipidemia, CKD stage III, stroke. She presented secondary to recurrent/intractable nausea and vomiting.   Assessment & Plan:   Active Problems:   HYPERTENSION, HEART CONTROLLED W/O ASSOC CHF   AV BLOCK, COMPLETE   Pacemaker-Medtronic   Daytime somnolence   Essential hypertension   Hyperlipidemia   AKI (acute kidney injury) (HCC)   Loss of weight   Hypokalemia   Low blood magnesium   Vomiting   Cough   Ileus (HCC)   Malnutrition of moderate degree   Intractable nausea/vomiting Unknown etiology. Possibly esophageal. S/p EGD on 3/17 significant for 2 gastric ulcers. History of strokes. CT head negative for acute stroke. No new neurologic symptoms. Patient without emesis but is still nauseated with any oral intake -GI recommendations: PPI, carafate, scheduled zofran. Reglan added; further recommendaitons -Encouraging patient to attempt oral intake -Continue IV fluids  Complete heart block S/p pacemaker. Stable.  History of stroke -Continue aspirin and Plavix  Hyperlipidemia -Continue Zocor  Hypokalemia/hypomagnesemia Secondary to vomiting. Supplementation given. Stable  Fever Unknown etiology. No source. Resolved.  Anemia Acute. Unknown etiology. Stable.  CKD stage III Stable.   DVT prophylaxis: Lovenox Code Status:   Code Status: Full Code Family Communication: None at bedside Disposition Plan: Discharge pending GI management   Consultants:   Gastroenterology  Procedures:   EGD (07/15/2018) Impression:       - Non-bleeding esophageal ulcer. Biopsied. 2 pieces                            from the edge of this black discolored ulcer crater                           - Small hiatal hernia.                           - Tortuous  esophagus.                           - The examination was otherwise normal.  Recommendation:                                      - Continue present medications.                           - Clear liquid diet.                           - make PPI bid                           add sucralfate                           add reflux precautions                           consider CNS eval, additional strokes as  a cause of                            nause and vomiting                           Hard to say if this ulcer is a cause or a result                            (favor latter) of process                           No evidence for an obstructive process - I suppose                            she could be having dysphagia interpreted as                            vomiting and then this ulcer and perhaps a spastic                            esophagus might be the cause                           we will f/u  Antimicrobials:  None    Subjective: Nauseated with any oral intake. She is unsure why she tolerated the MBSS exam. She tried to take ensure but felt significantly nauseated to the point of almost vomiting.  Objective: Vitals:   07/17/18 1229 07/17/18 2126 07/18/18 0531 07/18/18 0552  BP: 135/84 121/70  (!) 152/84  Pulse: 68 65  66  Resp: (!) 22 16  20   Temp: 98.3 F (36.8 C) 98 F (36.7 C)  98.9 F (37.2 C)  TempSrc: Oral Oral  Oral  SpO2: 99% 97%  98%  Weight:   89.4 kg   Height:        Intake/Output Summary (Last 24 hours) at 07/18/2018 0950 Last data filed at 07/18/2018 0445 Gross per 24 hour  Intake 1555.94 ml  Output -  Net 1555.94 ml   Filed Weights   07/16/18 0502 07/17/18 0500 07/18/18 0531  Weight: 89.6 kg 89.2 kg 89.4 kg    Examination:  General exam: Appears calm and comfortable Respiratory system: Clear to auscultation. Respiratory effort normal. Cardiovascular system: S1 & S2 heard, RRR. No murmurs, rubs, gallops or clicks. Gastrointestinal system:  Abdomen is nondistended, soft and nontender. No organomegaly or masses felt. Normal bowel sounds heard. Central nervous system: Alert and oriented. No focal neurological deficits. Extremities: No edema. No calf tenderness Skin: No cyanosis. No rashes Psychiatry: Judgement and insight appear normal. Mood & affect appropriate.    Data Reviewed: I have personally reviewed following labs and imaging studies  CBC: Recent Labs  Lab 07/13/18 1420 07/14/18 0249 07/15/18 0358 07/16/18 0321 07/17/18 0355 07/18/18 0337  WBC 8.7 8.7 6.8 6.0 6.6 8.0  NEUTROABS 6.6  --   --   --   --   --   HGB 10.7* 10.4* 9.8* 9.4* 9.9* 9.6*  HCT 36.0 35.8* 33.0* 32.1* 34.4* 34.1*  MCV 90.5 94.5 91.4 92.0 92.7 96.1  PLT 266 122* 223 219 240  151   Basic Metabolic Panel: Recent Labs  Lab 07/13/18 1630 07/13/18 2037 07/14/18 0249 07/15/18 0358 07/16/18 0321 07/18/18 0337  NA  --  141 143 145 144 144  K  --  3.5 3.7 3.3* 4.2 4.1  CL  --  112* 113* 116* 117* 118*  CO2  --  22 19* 21* 20* 21*  GLUCOSE  --  96 83 78 95 102*  BUN  --  10 9 12 10 8   CREATININE  --  0.74 0.77 0.72 0.71 0.91  CALCIUM  --  7.4* 7.7* 7.8* 7.8* 8.1*  MG 1.4*  --  2.0 1.8  --   --    GFR: Estimated Creatinine Clearance: 49.5 mL/min (by C-G formula based on SCr of 0.91 mg/dL). Liver Function Tests: Recent Labs  Lab 07/13/18 1420 07/13/18 2037  AST 13* 12*  ALT 8 8  ALKPHOS 57 51  BILITOT 0.8 0.9  PROT 6.6 6.1*  ALBUMIN 2.7* 2.5*   No results for input(s): LIPASE, AMYLASE in the last 168 hours. No results for input(s): AMMONIA in the last 168 hours. Coagulation Profile: Recent Labs  Lab 07/14/18 0249  INR 1.2   Cardiac Enzymes: Recent Labs  Lab 07/14/18 0249 07/14/18 0729 07/14/18 1349  TROPONINI 0.03* <0.03 <0.03   BNP (last 3 results) No results for input(s): PROBNP in the last 8760 hours. HbA1C: No results for input(s): HGBA1C in the last 72 hours. CBG: Recent Labs  Lab 07/14/18 0819  GLUCAP 89    Lipid Profile: No results for input(s): CHOL, HDL, LDLCALC, TRIG, CHOLHDL, LDLDIRECT in the last 72 hours. Thyroid Function Tests: No results for input(s): TSH, T4TOTAL, FREET4, T3FREE, THYROIDAB in the last 72 hours. Anemia Panel: No results for input(s): VITAMINB12, FOLATE, FERRITIN, TIBC, IRON, RETICCTPCT in the last 72 hours. Sepsis Labs: Recent Labs  Lab 07/13/18 1600  LATICACIDVEN 0.9    Recent Results (from the past 240 hour(s))  Urine Culture     Status: Abnormal   Collection Time: 07/13/18  2:20 PM  Result Value Ref Range Status   Specimen Description   Final    URINE, RANDOM Performed at [facility] [address] [place], [place] [address]    Special Requests   Final    NONE Performed at [facility] [place] [address], [place], [place] [address]    Culture MULTIPLE SPECIES PRESENT, SUGGEST RECOLLECTION (A)  Final   Report Status 07/14/2018 FINAL  Final         Radiology Studies: Dg Swallowing Func-speech Pathology  Result Date: 07/16/2018 Objective Swallowing Evaluation: Type of Study: MBS-Modified Barium Swallow Study  Patient Details Name: DENICE CARDON MRN: [id number] Date of Birth: 04-14-1934 Today's Date: 07/16/2018 Time: SLP Start Time (ACUTE ONLY): 1500 -SLP Stop Time (ACUTE ONLY): 1525 SLP Time Calculation (min) (ACUTE ONLY): 25 min Past Medical History: Past Medical History: Diagnosis Date . Complete heart block ([place])  . Gait abnormality 07/23/2016 . History of hypertension  . History of stroke  . Hyperhomocysteinemia ([place])  . Hyperlipidemia  . Hypertension  . Hypokalemia  . Hypokalemia 07/13/2018 . Low blood magnesium 07/13/2018 . Pacemaker-Medtronic 08/07/2011 . Vomiting 07/13/2018 Past Surgical History: Past Surgical History: Procedure Laterality Date . BIOPSY  07/15/2018  Procedure: BIOPSY;  Surgeon: [name], MD;  Location: [facility];  Service: Gastroenterology;; . CARDIAC CATHETERIZATION   .  ESOPHAGOGASTRODUODENOSCOPY Left 07/15/2018  Procedure: ESOPHAGOGASTRODUODENOSCOPY (EGD);  Surgeon: [name], MD;  Location: [facility];  Service: Gastroenterology;  Laterality: Left; . PACEMAKER INSERTION    Medtronic Enpulse dual-chamber pacemaker . PERMANENT PACEMAKER GENERATOR CHANGE N/A 07/30/2013  Procedure: PERMANENT PACEMAKER GENERATOR CHANGE;  Surgeon: [name], MD;  Location: [facility] CATH LAB;  Service: Cardiovascular;  Laterality: N/A; HPI: 85 yo female adm to [facility] with nausea/vomiting with intake, FTT.  Pt recently admitted to hospital after found down at home for 2 days - diagnosed with rhabdomylosis and required SNF stay.  She had been at [facility] for Rehab and was dc'd home.  Pt resides alone and reports dysphagia since rhabdomylosis episode.    She reports 30 pound weight loss since last hospital admission.  Pt has undergone esophagram 06/28/2018 showing minimal distal esophageal spasm, no stricture nor mass and small hiatal hernia.  Today EGD completed that showed non bleeding ulcer, tortuous esophagus and small hiatal hernia.  Pt has prior CVAs that caused her left sided weakness but did not cause swallowing deficits.  Pt denies problems swallowing, stating she can swallow fine but then feels nauseated and it "turns around" and comes back up. Pt underwent BSE yesterday and no indication of oropharyngeal dysphagia noted but GI MD desired to rule out definitively oropharyngeal dysphagia via MBS.   Subjective: pt awake in chair Assessment / Plan / Recommendation CHL IP CLINICAL IMPRESSIONS 07/16/2018 Clinical Impression Pt presented with minimal delay in transiting pudding (5 seconds with lingual rocking) and with oral retention of barium tablet with liquid *pt needed 2nd bolus of thin to transit tablet.  Suspect pt's oral transit delays with solids may be pt conditioned response to her complaint of nausea followed by gagging when swallowing.  She piecemealed puree/solids boluses which was functional  for her.  Pharyngeal swallow is timely and strong with NO aspiration or laryngeal penetration.  No oropharyngeal deficits present to account for pt's complaint of nausea followed by gagging and coughing.  Please note, pt did state she was nauseated after MBS - followed by belch and minimal expectoration of thin secretions/barium (less than 1/4 tsp).  This was not replicated further and pt stated "I think I'm ok".  Xray turned back on after pt's event and no barium observed in pharynx.  SLP will follow up x1 to encourage pt and educate her re: her functional oropharyngeal swallow.   SLP Visit Diagnosis Dysphagia, unspecified (R13.10) Attention and concentration deficit following -- Frontal lobe and executive function deficit following -- Impact on safety and function No limitations   CHL IP TREATMENT RECOMMENDATION 07/16/2018 Treatment Recommendations No treatment recommended at this time   Prognosis 07/16/2018 Prognosis for Safe Diet Advancement Good Barriers to Reach Goals -- Barriers/Prognosis Comment -- CHL IP DIET RECOMMENDATION 07/16/2018 SLP Diet Recommendations Other (Comment) Liquid Administration via Cup;Straw Medication Administration Whole meds with liquid Compensations Minimize environmental distractions;Slow rate;Small sips/bites Postural Changes Seated upright at 90 degrees;Remain semi-upright after after feeds/meals (Comment)   CHL IP OTHER RECOMMENDATIONS 07/16/2018 Recommended Consults -- Oral Care Recommendations Oral care BID Other Recommendations --   CHL IP FOLLOW UP RECOMMENDATIONS 07/16/2018 Follow up Recommendations None   CHL IP FREQUENCY AND DURATION 07/16/2018 Speech Therapy Frequency (ACUTE ONLY) min 1 x/week Treatment Duration 1 week      CHL IP ORAL PHASE 07/16/2018 Oral Phase Impaired Oral - Pudding Teaspoon -- Oral - Pudding Cup -- Oral - Honey Teaspoon -- Oral - Honey Cup -- Oral - Nectar Teaspoon WFL Oral - Nectar Cup WFL Oral - Nectar Straw -- Oral - Thin Teaspoon WFL Oral - Thin  Cup [facility]  Oral - Thin Straw WFL Oral - Puree Piecemeal swallowing;Delayed oral transit Oral - Mech Soft Piecemeal swallowing Oral - Regular -- Oral - Multi-Consistency -- Oral - Pill Delayed oral transit;Weak lingual manipulation;Other (Comment);Decreased bolus cohesion Oral Phase - Comment mild oral transit delays with pudding - when inquired "why" pt states " I guess because I have problems"  CHL IP PHARYNGEAL PHASE 07/16/2018 Pharyngeal Phase WFL Pharyngeal- Pudding Teaspoon -- Pharyngeal -- Pharyngeal- Pudding Cup -- Pharyngeal -- Pharyngeal- Honey Teaspoon -- Pharyngeal -- Pharyngeal- Honey Cup -- Pharyngeal -- Pharyngeal- Nectar Teaspoon WFL Pharyngeal -- Pharyngeal- Nectar Cup WFL Pharyngeal -- Pharyngeal- Nectar Straw -- Pharyngeal -- Pharyngeal- Thin Teaspoon WFL Pharyngeal -- Pharyngeal- Thin Cup WFL Pharyngeal -- Pharyngeal- Thin Straw WFL Pharyngeal -- Pharyngeal- Puree WFL Pharyngeal -- Pharyngeal- Mechanical Soft WFL Pharyngeal -- Pharyngeal- Regular -- Pharyngeal -- Pharyngeal- Multi-consistency -- Pharyngeal -- Pharyngeal- Pill WFL Pharyngeal -- Pharyngeal Comment --  CHL IP CERVICAL ESOPHAGEAL PHASE 07/16/2018 Cervical Esophageal Phase WFL Pudding Teaspoon -- Pudding Cup -- Honey Teaspoon -- Honey Cup -- Nectar Teaspoon -- Nectar Cup -- Nectar Straw -- Thin Teaspoon -- Thin Cup -- Thin Straw -- Puree -- Mechanical Soft -- Regular -- Multi-consistency -- Pill -- Cervical Esophageal Comment -- Macario Golds 07/16/2018, 6:24 PM  Luanna Salk, MS [facility] SLP Acute Rehab Services Pager [phone] Office [phone]                  Scheduled Meds: . aspirin EC  81 mg Oral Daily  . clopidogrel  75 mg Oral Q breakfast  . enoxaparin (LOVENOX) injection  40 mg Subcutaneous Q24H  . feeding supplement (ENSURE ENLIVE)  237 mL Oral BID BM  . latanoprost  1 drop Both Eyes QHS  . metoCLOPramide (REGLAN) injection  5 mg Intravenous TID AC  . pantoprazole (PROTONIX) IV  40 mg Intravenous Q12H  . simvastatin   40 mg Oral QHS  . sucralfate  1 g Oral TID WC & HS   Continuous Infusions: . sodium chloride 20 mL/hr at 07/15/18 0600  . sodium chloride 75 mL/hr at 07/15/18 0724  . dextrose 5 % and 0.9 % NaCl with KCl 40 mEq/L 75 mL/hr at 07/18/18 0008     LOS: 3 days     Cordelia Poche, MD [facility] 07/18/2018, 9:50 AM  If 7PM-7AM, please contact night-coverage [email or web address]

## 2018-07-18 NOTE — Progress Notes (Signed)
Nutrition Follow-up  DOCUMENTATION CODES:   Obesity unspecified, Non-severe (moderate) malnutrition in context of acute illness/injury  INTERVENTION:  Boost Breeze po TID, each supplement provides 250 kcal and 9 grams of protein   NUTRITION DIAGNOSIS:   Moderate Malnutrition related to acute illness(intractable nausea/vomiting) as evidenced by percent weight loss, energy intake < 75% for > 7 days, mild muscle depletion.  ongoing GOAL:   Patient will meet greater than or equal to 90% of their needs   MONITOR:   PO intake, Supplement acceptance, Weight trends, Labs  REASON FOR ASSESSMENT:   Malnutrition Screening Tool    ASSESSMENT:   Patient with PMH significant for HTN, HLD, CVA, CKD III, and complete heart block s/p pacemaker. Presents this admission with intractable nausea and vomiting.   3/20 GI note: patient continues to have limited PO since admission. Patient not drinking Ensure d/t dislike of milk and finds it unappetizing. Feelings of nausea with liquid regurgitation if she puts anything in her mouth s/p addition of Reglan on 3/19   Patient endorses continued decreased appetite and reports feeling nauseas at visit. Patient currently on CL diet, she is agreeable to BB but reports she is hesitant to drink anything d/t constant feelings of nausea.   Weights reviewed; stable   Medications: Protonix, Reglan,  D5 @ 75 ml/hr provides 306 kcals daily  3/17: EGD - non bleeding esophageal ulcer  Diet Order:   Diet Order            Diet clear liquid Room service appropriate? Yes; Fluid consistency: Thin  Diet effective now             3/17: NPO/CL 3/15: HH/CHO  EDUCATION NEEDS:   Education needs have been addressed  Skin:  Skin Assessment: Reviewed RN Assessment  Last BM:  3/19; type 7 (brown, small)  Height:   Ht Readings from Last 1 Encounters:  07/14/18 5\' 2"  (1.575 m)    Weight:   Wt Readings from Last 1 Encounters:  07/18/18 89.4 kg     Ideal Body Weight:  50 kg  BMI:  Body mass index is 36.05 kg/m.  Estimated Nutritional Needs:   Kcal:  1500-1700 kcal  Protein:  75-90 grams  Fluid:  >/= 1.5 L/day    Lajuan Lines, RD, LDN  After Hours/Weekend Pager: [phone]

## 2018-07-18 NOTE — Progress Notes (Signed)
Speech Language Pathology Treatment: Dysphagia  Patient Details Name: [name] MRN: [id number] DOB: 29-Oct-1933 Today's Date: 07/18/2018 Time: 1017-5102 SLP Time Calculation (min) (ACUTE ONLY): 20 min  Assessment / Plan / Recommendation Clinical Impression   SLP provided pt with detailed questions to provide MD with detailed information re: her symptoms.  She admits today to choking immediately with swallow of water after walking with PT.  Pt had consume some Resource with sprite prior to SLP arrival and admits to them causing nausea - also states occurs with sprite alone.  Trials of Resource alone, thicker water and applesauce observed. No indication of aspiraiton with these items but pt did report nausea and "feeling like it may come up".  Minimal expectoration of secretions noted after Resource swallow - secretions were clear and not the color of Resource consumed.  Suspect pt may have had minimal aspiraiton with water x1 due to her apprehension causing her to orally hold boluses longer than normal and possibly experiencing premature spillage into airway.  Advised her to consider consuming drinks with a minimal increase in viscocity if increased comfort (eg: orange juice, cranberry juice, coffee with cream, etc).  No SLP follow up indicated as education completed and pt provided with questionnaire to provide more detail to MD.  Thanks for allowing me to help care for this pt.     Provide detailed information regarding your nausea/choking.      1.  When does it happen?       2. What were you consuming?   3.  How much did you consume before this happened?      4. Does any drink or food stay down better? Carbonated, warm, cold, thicker????      What were your exact symptoms?   WRITE THEM DOWN:   A.  Did you get the drink down without immediately coughing/choking?   B. Did you get nauseated, feel like you were going to regurgitate, gag and then cough?   C. If you did  gag, regurgitate.  What did you spit out - Was it the color of what you consumed or clear??     HPI HPI: 85 yo female adm to [facility] with nausea/vomiting with intake, FTT.  Pt recently admitted to hospital after found down at home for 2 days - diagnosed with rhabdomylosis and required SNF stay.  She had been at [facility] for Rehab and was dc'd home.  Pt resides alone and reports dysphagia since rhabdomylosis episode.    She reports 30 pound weight loss since last hospital admission.  Pt has undergone esophagram 06/28/2018 showing minimal distal esophageal spasm, no stricture nor mass and small hiatal hernia.  Today EGD completed that showed non bleeding ulcer, tortuous esophagus and small hiatal hernia.  Pt has prior CVAs that caused her left sided weakness but did not cause swallowing deficits.  Pt denies problems swallowing, stating she can swallow fine but then feels nauseated and it "turns around" and comes back up. Pt underwent BSE yesterday and no indication of oropharyngeal dysphagia noted but GI MD desired to rule out definitively oropharyngeal dysphagia via MBS.        SLP Plan  All goals met       Recommendations  Diet recommendations: (defer to GI) Liquids provided via: Cup;Straw Medication Administration: Whole meds with liquid Supervision: Patient able to self feed Compensations: Slow rate;Small sips/bites Postural Changes and/or Swallow Maneuvers: Seated upright 90 degrees;Upright 30-60 min after meal  Follow up Recommendations: None SLP Visit Diagnosis: Dysphagia, unspecified (R13.10) Plan: All goals met       GO                Macario Golds 07/18/2018, 4:35 PM   Luanna Salk, [place] [facility] SLP Acute Rehab Services Pager [phone] Office [phone]

## 2018-07-18 NOTE — Progress Notes (Signed)
SLP Cancellation Note  Patient Details Name: ONIKA GUDIEL MRN: [id number] DOB: 07/11/1933   Cancelled treatment:       Reason Eval/Treat Not Completed: Other (comment)(pt now working with PT)   Luanna Salk, [place] [facility] SLP Acute Rehab Services Pager [phone] Office [phone]    Macario Golds 07/18/2018, 2:51 PM

## 2018-07-18 NOTE — Progress Notes (Signed)
Progress Note   Subjective  Chief Complaint: Nausea, vomiting  Today, the patient reports that she feels exactly the same. She has not eaten or drank much since being in the hospital. Has an Ensure sitting in front of her, but tells me she doesn't like milk, so that is not appetizing either. Continue to feel nauseous with liquid regurgitation if she puts anything in her mouth. No change after addition of Reglan yesterday.   Objective   Vital signs in last 24 hours: Temp:  [98 F (36.7 C)-98.9 F (37.2 C)] 98.9 F (37.2 C) (03/20 0552) Pulse Rate:  [65-68] 66 (03/20 0552) Resp:  [16-22] 20 (03/20 0552) BP: (121-152)/(70-84) 152/84 (03/20 0552) SpO2:  [97 %-99 %] 98 % (03/20 0552) Weight:  [89.4 kg] 89.4 kg (03/20 0531) Last BM Date: 07/17/18 General:   AA female in NAD Heart:  Regular rate and rhythm; no murmurs Lungs: Respirations even and unlabored, lungs CTA bilaterally Abdomen:  Soft, nontender and nondistended. Normal bowel sounds. Extremities:  Without edema. Neurologic:  Alert and oriented,  grossly normal neurologically. Psych:  Cooperative. Normal mood and affect.  Intake/Output from previous day: 03/19 0701 - 03/20 0700 In: 1555.9 [P.O.:460; I.V.:1095.9] Out: -   Lab Results: Recent Labs    07/16/18 0321 07/17/18 0355 07/18/18 0337  WBC 6.0 6.6 8.0  HGB 9.4* 9.9* 9.6*  HCT 32.1* 34.4* 34.1*  PLT 219 240 218   BMET Recent Labs    07/16/18 0321 07/18/18 0337  NA 144 144  K 4.2 4.1  CL 117* 118*  CO2 20* 21*  GLUCOSE 95 102*  BUN 10 8  CREATININE 0.71 0.91  CALCIUM 7.8* 8.1*   Studies/Results: Dg Swallowing Func-speech Pathology  Result Date: 07/16/2018 Objective Swallowing Evaluation: Type of Study: MBS-Modified Barium Swallow Study  Patient Details Name: [name] MRN: [id number] Date of Birth: 03-10-1934 Today's Date: 07/16/2018 Time: SLP Start Time (ACUTE ONLY): 1500 -SLP Stop Time (ACUTE ONLY): 1525 SLP Time Calculation (min) (ACUTE  ONLY): 25 min Past Medical History: Past Medical History: Diagnosis Date . Complete heart block ([place])  . Gait abnormality 07/23/2016 . History of hypertension  . History of stroke  . Hyperhomocysteinemia ([place])  . Hyperlipidemia  . Hypertension  . Hypokalemia  . Hypokalemia 07/13/2018 . Low blood magnesium 07/13/2018 . Pacemaker-Medtronic 08/07/2011 . Vomiting 07/13/2018 Past Surgical History: Past Surgical History: Procedure Laterality Date . BIOPSY  07/15/2018  Procedure: BIOPSY;  Surgeon: [name], MD;  Location: [facility];  Service: Gastroenterology;; . CARDIAC CATHETERIZATION   . ESOPHAGOGASTRODUODENOSCOPY Left 07/15/2018  Procedure: ESOPHAGOGASTRODUODENOSCOPY (EGD);  Surgeon: [name], MD;  Location: [facility];  Service: Gastroenterology;  Laterality: Left; . PACEMAKER INSERTION    Medtronic Enpulse dual-chamber pacemaker . PERMANENT PACEMAKER GENERATOR CHANGE N/A 07/30/2013  Procedure: PERMANENT PACEMAKER GENERATOR CHANGE;  Surgeon: [name], MD;  Location: [facility] CATH LAB;  Service: Cardiovascular;  Laterality: N/A; HPI: 85 yo female adm to [facility] with nausea/vomiting with intake, FTT.  Pt recently admitted to hospital after found down at home for 2 days - diagnosed with rhabdomylosis and required SNF stay.  She had been at [facility] for Rehab and was dc'd home.  Pt resides alone and reports dysphagia since rhabdomylosis episode.    She reports 30 pound weight loss since last hospital admission.  Pt has undergone esophagram 06/28/2018 showing minimal distal esophageal spasm, no stricture nor mass and small hiatal hernia.  Today EGD completed that showed non bleeding ulcer, tortuous esophagus  and small hiatal hernia.  Pt has prior CVAs that caused her left sided weakness but did not cause swallowing deficits.  Pt denies problems swallowing, stating she can swallow fine but then feels nauseated and it "turns around" and comes back up. Pt underwent BSE yesterday and no indication of oropharyngeal dysphagia  noted but GI MD desired to rule out definitively oropharyngeal dysphagia via MBS.   Subjective: pt awake in chair Assessment / Plan / Recommendation CHL IP CLINICAL IMPRESSIONS 07/16/2018 Clinical Impression Pt presented with minimal delay in transiting pudding (5 seconds with lingual rocking) and with oral retention of barium tablet with liquid *pt needed 2nd bolus of thin to transit tablet.  Suspect pt's oral transit delays with solids may be pt conditioned response to her complaint of nausea followed by gagging when swallowing.  She piecemealed puree/solids boluses which was functional for her.  Pharyngeal swallow is timely and strong with NO aspiration or laryngeal penetration.  No oropharyngeal deficits present to account for pt's complaint of nausea followed by gagging and coughing.  Please note, pt did state she was nauseated after MBS - followed by belch and minimal expectoration of thin secretions/barium (less than 1/4 tsp).  This was not replicated further and pt stated "I think I'm ok".  Xray turned back on after pt's event and no barium observed in pharynx.  SLP will follow up x1 to encourage pt and educate her re: her functional oropharyngeal swallow.   SLP Visit Diagnosis Dysphagia, unspecified (R13.10) Attention and concentration deficit following -- Frontal lobe and executive function deficit following -- Impact on safety and function No limitations   CHL IP TREATMENT RECOMMENDATION 07/16/2018 Treatment Recommendations No treatment recommended at this time   Prognosis 07/16/2018 Prognosis for Safe Diet Advancement Good Barriers to Reach Goals -- Barriers/Prognosis Comment -- CHL IP DIET RECOMMENDATION 07/16/2018 SLP Diet Recommendations Other (Comment) Liquid Administration via Cup;Straw Medication Administration Whole meds with liquid Compensations Minimize environmental distractions;Slow rate;Small sips/bites Postural Changes Seated upright at 90 degrees;Remain semi-upright after after feeds/meals  (Comment)   CHL IP OTHER RECOMMENDATIONS 07/16/2018 Recommended Consults -- Oral Care Recommendations Oral care BID Other Recommendations --   CHL IP FOLLOW UP RECOMMENDATIONS 07/16/2018 Follow up Recommendations None   CHL IP FREQUENCY AND DURATION 07/16/2018 Speech Therapy Frequency (ACUTE ONLY) min 1 x/week Treatment Duration 1 week      CHL IP ORAL PHASE 07/16/2018 Oral Phase Impaired Oral - Pudding Teaspoon -- Oral - Pudding Cup -- Oral - Honey Teaspoon -- Oral - Honey Cup -- Oral - Nectar Teaspoon WFL Oral - Nectar Cup WFL Oral - Nectar Straw -- Oral - Thin Teaspoon WFL Oral - Thin Cup WFL Oral - Thin Straw WFL Oral - Puree Piecemeal swallowing;Delayed oral transit Oral - Mech Soft Piecemeal swallowing Oral - Regular -- Oral - Multi-Consistency -- Oral - Pill Delayed oral transit;Weak lingual manipulation;Other (Comment);Decreased bolus cohesion Oral Phase - Comment mild oral transit delays with pudding - when inquired "why" pt states " I guess because I have problems"  CHL IP PHARYNGEAL PHASE 07/16/2018 Pharyngeal Phase WFL Pharyngeal- Pudding Teaspoon -- Pharyngeal -- Pharyngeal- Pudding Cup -- Pharyngeal -- Pharyngeal- Honey Teaspoon -- Pharyngeal -- Pharyngeal- Honey Cup -- Pharyngeal -- Pharyngeal- Nectar Teaspoon WFL Pharyngeal -- Pharyngeal- Nectar Cup WFL Pharyngeal -- Pharyngeal- Nectar Straw -- Pharyngeal -- Pharyngeal- Thin Teaspoon WFL Pharyngeal -- Pharyngeal- Thin Cup WFL Pharyngeal -- Pharyngeal- Thin Straw WFL Pharyngeal -- Pharyngeal- Puree WFL Pharyngeal -- Pharyngeal- Mechanical Soft WFL Pharyngeal -- Pharyngeal-  Regular -- Pharyngeal -- Pharyngeal- Multi-consistency -- Pharyngeal -- Pharyngeal- Pill WFL Pharyngeal -- Pharyngeal Comment --  CHL IP CERVICAL ESOPHAGEAL PHASE 07/16/2018 Cervical Esophageal Phase WFL Pudding Teaspoon -- Pudding Cup -- Honey Teaspoon -- Honey Cup -- Nectar Teaspoon -- Nectar Cup -- Nectar Straw -- Thin Teaspoon -- Thin Cup -- Thin Straw -- Puree -- Mechanical Soft --  Regular -- Multi-consistency -- Pill -- Cervical Esophageal Comment -- [name] 07/16/2018, 6:24 PM  Suzanne Salk, MS [facility] SLP Acute Rehab Services Pager [phone] Office [phone]                 Assessment / Plan:   Assessment: 1. 85 y/o female with nausea and vomiting immediately after taking p.o.  EGD 07/15/18 with non-bleeding, cratered 51mm ulcer at the GE junction, bx benign, MBSS 07/16/18 with some oropharyngeal slowness but no cause for vomiting, head CT negative for acute abnormalities - continue with symptoms regardless of scheduled Zofran, Compazine, PPI, Carafate and Reglan  2. Mild [place] anemia  Please await further recommendations from Dr. Lyndel Safe later today   LOS: 3 days   Lavone Nian [facility]  07/18/2018, 10:13 AM

## 2018-07-18 NOTE — Care Management Important Message (Signed)
Important Message  Patient Details  Name: [name] MRN: [id number] Date of Birth: [birth year]   Medicare Important Message Given:  Yes    Kerin Salen 07/18/2018, 12:00 [place] Message  Patient Details  Name: [name] MRN: [id number] Date of Birth: Oct 19, 1933   Medicare Important Message Given:  Yes    Kerin Salen 07/18/2018, 11:59 AM

## 2018-07-18 NOTE — Progress Notes (Signed)
Physical Therapy Treatment Patient Details Name: [name] MRN: [id number] DOB: July 01, 1933 Today's Date: 07/18/2018    History of Present Illness Pt is a 85 y.o. female admitted 3/15 with nausea, vomiting, and weakness. Pt had been at [facility] previously after a fall resulting in rhabdomyolysis and acute on chronic CKD. Pt had been home one day, then returned to ED for this hospitalization. PMH includes pacemaker, HTN, CVAx3 with L weakness per pt report, CHF, HLD.    PT Comments    Pt continues pleasant and cooperativebut progressing slowly and expressing frustration "I don't feel any better, I just can't eat".     Follow Up Recommendations  Home health PT;Supervision for mobility/OOB     Equipment Recommendations  None recommended by PT    Recommendations for Other Services       Precautions / Restrictions Precautions Precautions: Fall;ICD/Pacemaker Restrictions Weight Bearing Restrictions: No    Mobility  Bed Mobility               General bed mobility comments: Pt up in chair upon arrival and requesting to stay in chair upon PT exit.   Transfers Overall transfer level: Needs assistance Equipment used: Rolling walker (2 wheeled) Transfers: Sit to/from Stand Sit to Stand: Min guard         General transfer comment: Min guard for safety, increased time to come to full standing.   Ambulation/Gait Ambulation/Gait assistance: Min guard;Supervision Gait Distance (Feet): 200 Feet Assistive device: Rolling walker (2 wheeled) Gait Pattern/deviations: Step-through pattern;Trendelenburg;Decreased stride length;Trunk flexed Gait velocity: decr    General Gait Details: Min guard/supervision for safety. Verbal cuing for upright posture and position from RW.   Stairs             Wheelchair Mobility    Modified Rankin (Stroke Patients Only)       Balance Overall balance assessment: Needs assistance;History of Falls Sitting-balance support: No  upper extremity supported Sitting balance-Leahy Scale: Good     Standing balance support: Bilateral upper extremity supported Standing balance-Leahy Scale: Fair                              Cognition Arousal/Alertness: Awake/alert Behavior During Therapy: WFL for tasks assessed/performed Overall Cognitive Status: Within Functional Limits for tasks assessed                                 General Comments: Pt reports feeling down today because she still cannot eat without throwing up. Pt feels disheartened, and feels that she is no closer to receiving diagnosis at this time.       Exercises      General Comments        Pertinent Vitals/Pain Pain Assessment: No/denies pain    Home Living                      Prior Function            PT Goals (current goals can now be found in the care plan section) Acute Rehab PT Goals Patient Stated Goal: be independent as long as possible PT Goal Formulation: With patient Time For Goal Achievement: 07/29/18 Potential to Achieve Goals: Good Progress towards PT goals: Progressing toward goals    Frequency    Min 3X/week      PT Plan Current plan remains appropriate  Co-evaluation              AM-PAC PT "6 Clicks" Mobility   Outcome Measure  Help needed turning from your back to your side while in a flat bed without using bedrails?: A Little Help needed moving from lying on your back to sitting on the side of a flat bed without using bedrails?: A Little Help needed moving to and from a bed to a chair (including a wheelchair)?: A Little Help needed standing up from a chair using your arms (e.g., wheelchair or bedside chair)?: A Little Help needed to walk in hospital room?: A Little Help needed climbing 3-5 steps with a railing? : A Little 6 Click Score: 18    End of Session Equipment Utilized During Treatment: Gait belt Activity Tolerance: Patient tolerated treatment  well;Patient limited by fatigue Patient left: in chair;with call bell/phone within reach;with chair alarm set Nurse Communication: Mobility status PT Visit Diagnosis: Other abnormalities of gait and mobility (R26.89);Difficulty in walking, not elsewhere classified (R26.2);History of falling (Z91.81)     Time: 1450-1505 PT Time Calculation (min) (ACUTE ONLY): 15 min  Charges:  $Gait Training: 8-22 mins                     [place] Pager [phone] Office [phone]    [name] 07/18/2018, 3:15 PM

## 2018-07-19 DIAGNOSIS — N179 Acute kidney failure, unspecified: Secondary | ICD-10-CM

## 2018-07-19 DIAGNOSIS — R05 Cough: Secondary | ICD-10-CM

## 2018-07-19 LAB — CBC
HCT: 30.7 % — ABNORMAL LOW (ref 36.0–46.0)
HEMOGLOBIN: 9.1 g/dL — AB (ref 12.0–15.0)
MCH: 26.9 pg (ref 26.0–34.0)
MCHC: 29.6 g/dL — AB (ref 30.0–36.0)
MCV: 90.8 fL (ref 80.0–100.0)
Platelets: 221 10*3/uL (ref 150–400)
RBC: 3.38 MIL/uL — ABNORMAL LOW (ref 3.87–5.11)
RDW: 16.6 % — ABNORMAL HIGH (ref 11.5–15.5)
WBC: 6.9 10*3/uL (ref 4.0–10.5)
nRBC: 0 % (ref 0.0–0.2)

## 2018-07-19 NOTE — Progress Notes (Signed)
Physical Therapy Treatment Patient Details Name: [name] MRN: [id number] DOB: 03/20/1934 Today's Date: 07/19/2018    History of Present Illness Pt is a 85 y.o. female admitted 3/15 with nausea, vomiting, and weakness. Pt had been at [facility] previously after a fall resulting in rhabdomyolysis and acute on chronic CKD. Pt had been home one day, then returned to ED for this hospitalization. PMH includes pacemaker, HTN, CVAx3 with L weakness per pt report, CHF, HLD.    PT Comments    Patient is willing to ambulate. Expresses  That she is concerned that she cannot keep food down. Continue PT /mobility.   Follow Up Recommendations  Home health PT;Supervision for mobility/OOB     Equipment Recommendations  None recommended by PT    Recommendations for Other Services       Precautions / Restrictions Precautions Precautions: Fall;ICD/Pacemaker    Mobility  Bed Mobility               General bed mobility comments: Pt up in chair upon arrival and requesting to stay in chair upon PT exit.   Transfers   Equipment used: Rolling walker (2 wheeled) Transfers: Sit to/from Stand Sit to Stand: Supervision         General transfer comment: from recliner and toilet  Ambulation/Gait Ambulation/Gait assistance: Min guard Gait Distance (Feet): 160 Feet Assistive device: Rolling walker (2 wheeled) Gait Pattern/deviations: Step-through pattern;Decreased stride length;Trunk flexed Gait velocity: decr    General Gait Details: Min guard/supervision for safety. Verbal cuing for upright posture and position from RW.   Stairs             Wheelchair Mobility    Modified Rankin (Stroke Patients Only)       Balance                                            Cognition Arousal/Alertness: Awake/alert                                     General Comments: Pt reports feeling down today because she still cannot eat without  throwing up. Pt feels disheartened that she  can not eat      Exercises      General Comments        Pertinent Vitals/Pain Pain Assessment: No/denies pain    Home Living                      Prior Function            PT Goals (current goals can now be found in the care plan section) Progress towards PT goals: Progressing toward goals    Frequency    Min 3X/week      PT Plan Current plan remains appropriate    Co-evaluation              AM-PAC PT "6 Clicks" Mobility   Outcome Measure  Help needed turning from your back to your side while in a flat bed without using bedrails?: A Little Help needed moving from lying on your back to sitting on the side of a flat bed without using bedrails?: A Little Help needed moving to and from a bed to a chair (including a wheelchair)?: A Little Help needed standing  up from a chair using your arms (e.g., wheelchair or bedside chair)?: A Little Help needed to walk in hospital room?: A Little Help needed climbing 3-5 steps with a railing? : A Little 6 Click Score: 18    End of Session Equipment Utilized During Treatment: Gait belt Activity Tolerance: Patient tolerated treatment well Patient left: in chair;with call bell/phone within reach;with chair alarm set Nurse Communication: Mobility status PT Visit Diagnosis: Other abnormalities of gait and mobility (R26.89);Difficulty in walking, not elsewhere classified (R26.2);History of falling (Z91.81)     Time: 0347-4259 PT Time Calculation (min) (ACUTE ONLY): 14 min  Charges:  $Gait Training: 8-22 mins                     [name] PT Acute Rehabilitation Services Pager [phone] Office [phone]    Claretha Cooper 07/19/2018, 2:58 PM

## 2018-07-19 NOTE — Progress Notes (Signed)
PROGRESS NOTE    [name]  EPP:[id number] DOB: Sep 12, 1933 DOA: 07/13/2018 PCP: Randel Books, FNP   Brief Narrative: [name] is a 85 y.o. female with a history of complete heart block s/p pacemaker, hyperlipidemia, CKD stage III, stroke. She presented secondary to recurrent/intractable nausea and vomiting.   Assessment & Plan:   Active Problems:   HYPERTENSION, HEART CONTROLLED W/O ASSOC CHF   AV BLOCK, COMPLETE   Pacemaker-Medtronic   Daytime somnolence   Essential hypertension   Hyperlipidemia   AKI (acute kidney injury) (HCC)   Loss of weight   Hypokalemia   Low blood magnesium   Vomiting   Cough   Ileus (HCC)   Malnutrition of moderate degree   Intractable nausea/vomiting Unknown etiology. Possibly esophageal. S/p EGD on 3/17 significant for 2 gastric ulcers. History of strokes. CT head negative for acute stroke. No new neurologic symptoms. Was able to keep intake down yesterday. Does not like shakes. Spitting up but no nausea. -GI recommendations: PPI, carafate, scheduled zofran. Reglan added. further recommendations pending today. -Encouraging patient to attempt oral intake -Continue IV fluids  Complete heart block S/p pacemaker. Stable.  History of stroke -Continue aspirin and Plavix  Hyperlipidemia -Continue Zocor  Hypokalemia/hypomagnesemia Secondary to vomiting. Supplementation given. Stable  Fever Unknown etiology. No source. Resolved.  Anemia Acute. Unknown etiology. Stable.  CKD stage III Stable.   DVT prophylaxis: Lovenox Code Status:   Code Status: Full Code Family Communication: None at bedside Disposition Plan: Discharge pending GI management   Consultants:   Gastroenterology  Procedures:   EGD (07/15/2018) Impression:       - Non-bleeding esophageal ulcer. Biopsied. 2 pieces                            from the edge of this black discolored ulcer crater                           - Small hiatal  hernia.                           - Tortuous esophagus.                           - The examination was otherwise normal.  Recommendation:                                      - Continue present medications.                           - Clear liquid diet.                           - make PPI bid                           add sucralfate                           add reflux precautions  consider CNS eval, additional strokes as a cause of                            nause and vomiting                           Hard to say if this ulcer is a cause or a result                            (favor latter) of process                           No evidence for an obstructive process - I suppose                            she could be having dysphagia interpreted as                            vomiting and then this ulcer and perhaps a spastic                            esophagus might be the cause                           we will f/u  Antimicrobials:  None    Subjective: No issues overnight. No nausea  Objective: Vitals:   07/18/18 0552 07/18/18 1406 07/18/18 2022 07/19/18 0645  BP: (!) 152/84 130/69 134/71 133/84  Pulse: 66 73 74 65  Resp: 20 18 18 17   Temp: 98.9 F (37.2 C) 98.2 F (36.8 C) 98.8 F (37.1 C) 97.9 F (36.6 C)  TempSrc: Oral Oral Oral Oral  SpO2: 98% 96% 97% 97%  Weight:    88.8 kg  Height:        Intake/Output Summary (Last 24 hours) at 07/19/2018 0859 Last data filed at 07/19/2018 0547 Gross per 24 hour  Intake 912.5 ml  Output -  Net 912.5 ml   Filed Weights   07/17/18 0500 07/18/18 0531 07/19/18 0645  Weight: 89.2 kg 89.4 kg 88.8 kg    Examination:  General exam: Appears calm and comfortable Respiratory system: Clear to auscultation. Respiratory effort normal. Cardiovascular system: S1 & S2 heard, RRR. No murmurs, rubs, gallops or clicks. Gastrointestinal system: Abdomen is nondistended, soft and nontender. No organomegaly or  masses felt. Normal bowel sounds heard. Central nervous system: Alert and oriented. No focal neurological deficits. Extremities: Trace LE edema. No calf tenderness Skin: No cyanosis. No rashes Psychiatry: Judgement and insight appear normal. Mood & affect appropriate.      Data Reviewed: I have personally reviewed following labs and imaging studies  CBC: Recent Labs  Lab 07/13/18 1420  07/15/18 0358 07/16/18 0321 07/17/18 0355 07/18/18 0337 07/19/18 0337  WBC 8.7   < > 6.8 6.0 6.6 8.0 6.9  NEUTROABS 6.6  --   --   --   --   --   --   HGB 10.7*   < > 9.8* 9.4* 9.9* 9.6* 9.1*  HCT 36.0   < > 33.0* 32.1* 34.4* 34.1* 30.7*  MCV 90.5   < > 91.4 92.0 92.7 96.1 90.8  PLT 266   < > 223 219 240 218 221   < > = values in this interval not displayed.   Basic Metabolic Panel: Recent Labs  Lab 07/13/18 1630 07/13/18 2037 07/14/18 0249 07/15/18 0358 07/16/18 0321 07/18/18 0337  NA  --  141 143 145 144 144  K  --  3.5 3.7 3.3* 4.2 4.1  CL  --  112* 113* 116* 117* 118*  CO2  --  22 19* 21* 20* 21*  GLUCOSE  --  96 83 78 95 102*  BUN  --  10 9 12 10 8   CREATININE  --  0.74 0.77 0.72 0.71 0.91  CALCIUM  --  7.4* 7.7* 7.8* 7.8* 8.1*  MG 1.4*  --  2.0 1.8  --   --    GFR: Estimated Creatinine Clearance: 49.4 mL/min (by C-G formula based on SCr of 0.91 mg/dL). Liver Function Tests: Recent Labs  Lab 07/13/18 1420 07/13/18 2037  AST 13* 12*  ALT 8 8  ALKPHOS 57 51  BILITOT 0.8 0.9  PROT 6.6 6.1*  ALBUMIN 2.7* 2.5*   No results for input(s): LIPASE, AMYLASE in the last 168 hours. No results for input(s): AMMONIA in the last 168 hours. Coagulation Profile: Recent Labs  Lab 07/14/18 0249  INR 1.2   Cardiac Enzymes: Recent Labs  Lab 07/14/18 0249 07/14/18 0729 07/14/18 1349  TROPONINI 0.03* <0.03 <0.03   BNP (last 3 results) No results for input(s): PROBNP in the last 8760 hours. HbA1C: No results for input(s): HGBA1C in the last 72 hours. CBG: Recent Labs  Lab  07/14/18 0819  GLUCAP 89   Lipid Profile: No results for input(s): CHOL, HDL, LDLCALC, TRIG, CHOLHDL, LDLDIRECT in the last 72 hours. Thyroid Function Tests: No results for input(s): TSH, T4TOTAL, FREET4, T3FREE, THYROIDAB in the last 72 hours. Anemia Panel: No results for input(s): VITAMINB12, FOLATE, FERRITIN, TIBC, IRON, RETICCTPCT in the last 72 hours. Sepsis Labs: Recent Labs  Lab 07/13/18 1600  LATICACIDVEN 0.9    Recent Results (from the past 240 hour(s))  Urine Culture     Status: Abnormal   Collection Time: 07/13/18  2:20 PM  Result Value Ref Range Status   Specimen Description   Final    URINE, RANDOM Performed at [facility] [address] [place], [place] [address]    Special Requests   Final    NONE Performed at [facility], [place] [address] [place], [place] [address]    Culture MULTIPLE SPECIES PRESENT, SUGGEST RECOLLECTION (A)  Final   Report Status 07/14/2018 FINAL  Final         Radiology Studies: No results found.      Scheduled Meds: . aspirin EC  81 mg Oral Daily  . clopidogrel  75 mg Oral Q breakfast  . enoxaparin (LOVENOX) injection  40 mg Subcutaneous Q24H  . feeding supplement  1 Container Oral TID BM  . latanoprost  1 drop Both Eyes QHS  . metoCLOPramide (REGLAN) injection  5 mg Intravenous TID AC  . pantoprazole (PROTONIX) IV  40 mg Intravenous Q12H  . simvastatin  40 mg Oral QHS  . sucralfate  1 g Oral TID WC & HS   Continuous Infusions: . sodium chloride 20 mL/hr at 07/15/18 0600  . sodium chloride 75 mL/hr at 07/15/18 0724  . dextrose 5 % and 0.9 % NaCl with KCl 40 mEq/L 75 mL/hr at 07/19/18 0023  . ondansetron [facility]) IV 8 mg (07/19/18  2919)     LOS: 4 days     Cordelia Poche, MD [facility] 07/19/2018, 8:59 AM  If 7PM-7AM, please contact night-coverage [email or web address]

## 2018-07-19 NOTE — Progress Notes (Signed)
Patient with emesis x1 after Boost drink.

## 2018-07-19 NOTE — Progress Notes (Signed)
     [facility] Progress Note       ASSESSMENT AND PLAN:  85 year old with persistent nausea/vomiting.  Ulcer at GE junction (Bx-benign), negative barium swallow, negative CT of the head, negative bedside swallowing evaluation and MBS.  Plan: -Continue current medications occluding Reglan OTC -CT scan Abdo/pelvis in a.m. -Would rpt EGD to ensure healing of the gastric ulcer in 8 to 12 weeks. -Encouraged ambulation -Discussed in detail with the patient's daughter/caregiver     OBJECTIVE:   Does not feel any different. Still having nausea/vomiting SHe did drink water in front of me without any problems. Been having some diarrhea ever since Reglan.  Vital signs in last 24 hours: Temp:  [97.5 F (36.4 C)-98.8 F (37.1 C)] 97.5 F (36.4 C) (03/21 1347) Pulse Rate:  [62-74] 62 (03/21 1347) Resp:  [16-18] 16 (03/21 1347) BP: (133-134)/(71-84) 133/74 (03/21 1347) SpO2:  [94 %-97 %] 94 % (03/21 1347) Weight:  [88.8 kg] 88.8 kg (03/21 0645) Last BM Date: 07/18/18 General:   Alert, well-developed,    in NAD EENT:  Normal hearing, non icteric sclera, conjunctive pink.  Heart:  Regular rate and rhythm; no murmurs. lower extremity edema Pulm: Normal respiratory effort, lungs CTA bilaterally without wheezes or crackles. Abdomen:  Soft, nondistended, nontender.  Normal bowel sounds, no masses felt. No hepatomegaly.    Neurologic:  Alert and  oriented x4;  grossly normal neurologically. Psych:  Pleasant, cooperative.  Normal mood and affect.   Intake/Output from previous day: 03/20 0701 - 03/21 0700 In: 912.5 [P.O.:210; I.V.:702.5] Out: -  Intake/Output this shift: Total I/O In: 1066 [P.O.:50; I.V.:800; IV Piggyback:216] Out: -   Lab Results: Recent Labs    07/17/18 0355 07/18/18 0337 07/19/18 0337  WBC 6.6 8.0 6.9  HGB 9.9* 9.6* 9.1*  HCT 34.4* 34.1* 30.7*  PLT 240 218 221   BMET Recent Labs    07/18/18 0337  NA 144  K 4.1  CL 118*  CO2 21*   GLUCOSE 102*  BUN 8  CREATININE 0.91  CALCIUM 8.1*      Active Problems:   HYPERTENSION, HEART CONTROLLED W/O ASSOC CHF   AV BLOCK, COMPLETE   Pacemaker-Medtronic   Daytime somnolence   Essential hypertension   Hyperlipidemia   AKI (acute kidney injury) (HCC)   Loss of weight   Hypokalemia   Low blood magnesium   Vomiting   Cough   Ileus (HCC)   Malnutrition of moderate degree     LOS: 4 days   Jackquline Denmark ,MD 07/19/2018, 6:52 PM

## 2018-07-20 ENCOUNTER — Inpatient Hospital Stay (HOSPITAL_COMMUNITY): Payer: Medicare Other

## 2018-07-20 ENCOUNTER — Encounter (HOSPITAL_COMMUNITY): Payer: Self-pay | Admitting: Physician Assistant

## 2018-07-20 LAB — CBC
HCT: 32.1 % — ABNORMAL LOW (ref 36.0–46.0)
Hemoglobin: 9.7 g/dL — ABNORMAL LOW (ref 12.0–15.0)
MCH: 27.3 pg (ref 26.0–34.0)
MCHC: 30.2 g/dL (ref 30.0–36.0)
MCV: 90.4 fL (ref 80.0–100.0)
PLATELETS: 205 10*3/uL (ref 150–400)
RBC: 3.55 MIL/uL — ABNORMAL LOW (ref 3.87–5.11)
RDW: 16.4 % — ABNORMAL HIGH (ref 11.5–15.5)
WBC: 5.1 10*3/uL (ref 4.0–10.5)
nRBC: 0 % (ref 0.0–0.2)

## 2018-07-20 MED ORDER — SODIUM CHLORIDE (PF) 0.9 % IJ SOLN
INTRAMUSCULAR | Status: AC
  Administered 2018-07-20: 14:00:00
  Filled 2018-07-20: qty 50

## 2018-07-20 MED ORDER — IOHEXOL 300 MG/ML  SOLN
30.0000 mL | Freq: Once | INTRAMUSCULAR | Status: AC | PRN
  Administered 2018-07-20: 30 mL via ORAL

## 2018-07-20 MED ORDER — IOHEXOL 300 MG/ML  SOLN
100.0000 mL | Freq: Once | INTRAMUSCULAR | Status: AC | PRN
  Administered 2018-07-20: 100 mL via INTRAVENOUS

## 2018-07-20 NOTE — Progress Notes (Signed)
PROGRESS NOTE    [name]  JXB:[id number] DOB: June 19, 1933 DOA: 07/13/2018 PCP: Randel Books, FNP   Brief Narrative: [name] is a 85 y.o. female with a history of complete heart block s/p pacemaker, hyperlipidemia, CKD stage III, stroke. She presented secondary to recurrent/intractable nausea and vomiting.   Assessment & Plan:   Active Problems:   HYPERTENSION, HEART CONTROLLED W/O ASSOC CHF   AV BLOCK, COMPLETE   Pacemaker-Medtronic   Daytime somnolence   Essential hypertension   Hyperlipidemia   AKI (acute kidney injury) (HCC)   Loss of weight   Hypokalemia   Low blood magnesium   Vomiting   Cough   Ileus (HCC)   Malnutrition of moderate degree   Intractable nausea/vomiting Unknown etiology. Possibly esophageal. S/p EGD on 3/17 significant for 2 gastric ulcers. History of strokes. CT head negative for acute stroke. No new neurologic symptoms. Was able to keep intake down yesterday. Does not like shakes. Spitting up but no nausea. CT scan significant for enteritis -GI recommendations: PPI, carafate, scheduled zofran. Reglan added.  -Encouraging patient to attempt oral intake -Continue IV fluids  Right kidney mass 2.5 cm. Incidental. Concern for possible renal cell carcinoma -Will discuss with patient. Will need outpatient urology follow-up and repeat imaging  Complete heart block S/p pacemaker. Stable.  History of stroke -Continue aspirin and Plavix  Hyperlipidemia -Continue Zocor  Hypokalemia/hypomagnesemia Secondary to vomiting. Supplementation given. Stable  Fever Unknown etiology. No source. Resolved.  Anemia Acute. Unknown etiology. Stable.  CKD stage III Stable.   DVT prophylaxis: Lovenox Code Status:   Code Status: Full Code Family Communication: None at bedside Disposition Plan: Discharge pending GI management   Consultants:   Gastroenterology  Procedures:   EGD (07/15/2018) Impression:       - Non-bleeding  esophageal ulcer. Biopsied. 2 pieces                            from the edge of this black discolored ulcer crater                           - Small hiatal hernia.                           - Tortuous esophagus.                           - The examination was otherwise normal.  Recommendation:                                      - Continue present medications.                           - Clear liquid diet.                           - make PPI bid                           add sucralfate                           add  reflux precautions                           consider CNS eval, additional strokes as a cause of                            nause and vomiting                           Hard to say if this ulcer is a cause or a result                            (favor latter) of process                           No evidence for an obstructive process - I suppose                            she could be having dysphagia interpreted as                            vomiting and then this ulcer and perhaps a spastic                            esophagus might be the cause                           we will f/u  Antimicrobials:  None    Subjective: Feels mildly better. Not tolerating food much still.  Objective: Vitals:   07/19/18 2059 07/20/18 0516 07/20/18 0519 07/20/18 1327  BP: 130/67  124/65 133/81  Pulse: 64  (!) 59 82  Resp: 18  16 (!) 22  Temp: 98.5 F (36.9 C)  98 F (36.7 C) 97.9 F (36.6 C)  TempSrc: Oral  Oral Oral  SpO2: 100%  97% 100%  Weight:  89.2 kg    Height:        Intake/Output Summary (Last 24 hours) at 07/20/2018 1432 Last data filed at 07/20/2018 0521 Gross per 24 hour  Intake 2092.25 ml  Output -  Net 2092.25 ml   Filed Weights   07/18/18 0531 07/19/18 0645 07/20/18 0516  Weight: 89.4 kg 88.8 kg 89.2 kg    Examination:  General exam: Appears calm and comfortable Respiratory system: Clear to auscultation. Respiratory effort normal. Cardiovascular  system: S1 & S2 heard, RRR. No murmurs, rubs, gallops or clicks. Gastrointestinal system: Abdomen is nondistended, soft and nontender. No organomegaly or masses felt. Normal bowel sounds heard. Central nervous system: Alert and oriented. No focal neurological deficits. Extremities: 1+ edema. No calf tenderness Skin: No cyanosis. No rashes Psychiatry: Judgement and insight appear normal. Mood & affect appropriate.    Data Reviewed: I have personally reviewed following labs and imaging studies  CBC: Recent Labs  Lab 07/16/18 0321 07/17/18 0355 07/18/18 0337 07/19/18 0337 07/20/18 0458  WBC 6.0 6.6 8.0 6.9 5.1  HGB 9.4* 9.9* 9.6* 9.1* 9.7*  HCT 32.1* 34.4* 34.1* 30.7* 32.1*  MCV 92.0 92.7 96.1 90.8 90.4  PLT 219 240 218 221 124   Basic Metabolic Panel: Recent Labs  Lab 07/13/18 1630  07/13/18 2037 07/14/18 0249 07/15/18 0358 07/16/18 0321 07/18/18 0337  NA  --  141 143 145 144 144  K  --  3.5 3.7 3.3* 4.2 4.1  CL  --  112* 113* 116* 117* 118*  CO2  --  22 19* 21* 20* 21*  GLUCOSE  --  96 83 78 95 102*  BUN  --  10 9 12 10 8   CREATININE  --  0.74 0.77 0.72 0.71 0.91  CALCIUM  --  7.4* 7.7* 7.8* 7.8* 8.1*  MG 1.4*  --  2.0 1.8  --   --    GFR: Estimated Creatinine Clearance: 49.4 mL/min (by C-G formula based on SCr of 0.91 mg/dL). Liver Function Tests: Recent Labs  Lab 07/13/18 2037  AST 12*  ALT 8  ALKPHOS 51  BILITOT 0.9  PROT 6.1*  ALBUMIN 2.5*   No results for input(s): LIPASE, AMYLASE in the last 168 hours. No results for input(s): AMMONIA in the last 168 hours. Coagulation Profile: Recent Labs  Lab 07/14/18 0249  INR 1.2   Cardiac Enzymes: Recent Labs  Lab 07/14/18 0249 07/14/18 0729 07/14/18 1349  TROPONINI 0.03* <0.03 <0.03   BNP (last 3 results) No results for input(s): PROBNP in the last 8760 hours. HbA1C: No results for input(s): HGBA1C in the last 72 hours. CBG: Recent Labs  Lab 07/14/18 0819  GLUCAP 89   Lipid Profile: No  results for input(s): CHOL, HDL, LDLCALC, TRIG, CHOLHDL, LDLDIRECT in the last 72 hours. Thyroid Function Tests: No results for input(s): TSH, T4TOTAL, FREET4, T3FREE, THYROIDAB in the last 72 hours. Anemia Panel: No results for input(s): VITAMINB12, FOLATE, FERRITIN, TIBC, IRON, RETICCTPCT in the last 72 hours. Sepsis Labs: Recent Labs  Lab 07/13/18 1600  LATICACIDVEN 0.9    Recent Results (from the past 240 hour(s))  Urine Culture     Status: Abnormal   Collection Time: 07/13/18  2:20 PM  Result Value Ref Range Status   Specimen Description   Final    URINE, RANDOM Performed at [facility] [address], [place], [place] [address]    Special Requests   Final    NONE Performed at [facility], [place] [address] [place], [place] [address]    Culture MULTIPLE SPECIES PRESENT, SUGGEST RECOLLECTION (A)  Final   Report Status 07/14/2018 FINAL  Final         Radiology Studies: Ct Abdomen Pelvis W Contrast  Result Date: 07/20/2018 CLINICAL DATA:  Persistent nausea and vomiting. Loss of appetite. EXAM: CT ABDOMEN AND PELVIS WITH CONTRAST TECHNIQUE: Multidetector CT imaging of the abdomen and pelvis was performed using the standard protocol following bolus administration of intravenous contrast. CONTRAST:  33mL OMNIPAQUE IOHEXOL 300 MG/ML SOLN, 139mL OMNIPAQUE IOHEXOL 300 MG/ML SOLN COMPARISON:  None. FINDINGS: Lower chest: No acute abnormality. Hepatobiliary: Cholelithiasis without evidence of acute cholecystitis. No focal liver abnormality. No bile duct dilatation seen. Pancreas: Unremarkable. No pancreatic ductal dilatation or surrounding inflammatory changes. Spleen: Normal in size without focal abnormality. Adrenals/Urinary Tract: Bilateral renal cysts. Mass within the lower pole of the RIGHT kidney measures 2.5 cm, with possible enhancing component. No hydronephrosis. No ureteral or bladder calculi identified. Bladder is decompressed.  Stomach/Bowel: Thickening of the walls of the majority of the colon, most prominent/definitive within the descending and sigmoid colon, probable additional thickening of the ascending colon/cecum. Associated pericolonic inflammation/fluid stranding, again most prominent along the walls of the descending and sigmoid colon. No dilated large or small bowel  loops. Stomach is unremarkable, partially decompressed. Vascular/Lymphatic: Aortic atherosclerosis. No acute appearing vascular abnormality. No enlarged lymph nodes seen. Reproductive: Uterus and bilateral adnexa are unremarkable. Other: Small amount of free fluid in the lower pelvis. No abscess collection seen. No free intraperitoneal air. Musculoskeletal: Degenerative changes throughout the slightly scoliotic thoracolumbar spine, mild to moderate in degree. No acute or suspicious osseous finding. IMPRESSION: 1. Thickening of the walls of the majority of the colon, most prominent/definitive within the descending and sigmoid colon, with associated pericolonic inflammation/fluid stranding. Findings are consistent with colitis of infectious or inflammatory nature. 2. **An incidental finding of potential clinical significance has been found. 2.5 cm mass within the lower pole of the right kidney, suspicious for renal cell carcinoma. Recommend further characterization with nonemergent renal MRI.** 3. Cholelithiasis without evidence of acute cholecystitis. Aortic Atherosclerosis (ICD10-I70.0). Electronically Signed   By: Franki Cabot M.D.   On: 07/20/2018 13:14        Scheduled Meds: . aspirin EC  81 mg Oral Daily  . clopidogrel  75 mg Oral Q breakfast  . enoxaparin (LOVENOX) injection  40 mg Subcutaneous Q24H  . feeding supplement  1 Container Oral TID BM  . latanoprost  1 drop Both Eyes QHS  . metoCLOPramide (REGLAN) injection  5 mg Intravenous TID AC  . pantoprazole (PROTONIX) IV  40 mg Intravenous Q12H  . simvastatin  40 mg Oral QHS  . sucralfate  1 g  Oral TID WC & HS   Continuous Infusions: . dextrose 5 % and 0.9 % NaCl with KCl 40 mEq/L 75 mL/hr at 07/20/18 0523     LOS: 5 days     Cordelia Poche, MD [facility] 07/20/2018, 2:32 PM  If 7PM-7AM, please contact night-coverage [email or web address]

## 2018-07-20 NOTE — Progress Notes (Addendum)
Daily Rounding Note  07/20/2018, 10:24 AM  LOS: 5 days   SUBJECTIVE:   Chief complaint: Nausea, dysphagia. Continues to complain of postprandial nausea. Small BM this morning.  OBJECTIVE:         Vital signs in last 24 hours:    Temp:  [97.5 F (36.4 C)-98.5 F (36.9 C)] 98 F (36.7 C) (03/22 0519) Pulse Rate:  [59-64] 59 (03/22 0519) Resp:  [16-18] 16 (03/22 0519) BP: (124-133)/(65-74) 124/65 (03/22 0519) SpO2:  [94 %-100 %] 97 % (03/22 0519) Weight:  [89.2 kg] 89.2 kg (03/22 0516) Last BM Date: 07/18/18 Filed Weights   07/18/18 0531 07/19/18 0645 07/20/18 0516  Weight: 89.4 kg 88.8 kg 89.2 kg   General: Obese, frail, elderly. Heart: RRR. Chest: Reduced breath sounds generally.  No shortness of breath or cough. Abdomen: Soft.  Not tender or distended bowel sounds present. Extremities: No CCE. Neuro/Psych: Appropriate.  Moves all 4 limbs.  Intake/Output from previous day: 03/21 0701 - 03/22 0700 In: 2142.3 [P.O.:50; I.V.:1876.3; IV Piggyback:216] Out: -   Intake/Output this shift: No intake/output data recorded.  Lab Results: Recent Labs    07/18/18 0337 07/19/18 0337 07/20/18 0458  WBC 8.0 6.9 5.1  HGB 9.6* 9.1* 9.7*  HCT 34.1* 30.7* 32.1*  PLT 218 221 205   BMET Recent Labs    07/18/18 0337  NA 144  K 4.1  CL 118*  CO2 21*  GLUCOSE 102*  BUN 8  CREATININE 0.91  CALCIUM 8.1*    Studies/Results: No results found.   Scheduled Meds: . aspirin EC  81 mg Oral Daily  . clopidogrel  75 mg Oral Q breakfast  . enoxaparin (LOVENOX) injection  40 mg Subcutaneous Q24H  . feeding supplement  1 Container Oral TID BM  . latanoprost  1 drop Both Eyes QHS  . metoCLOPramide (REGLAN) injection  5 mg Intravenous TID AC  . pantoprazole (PROTONIX) IV  40 mg Intravenous Q12H  . simvastatin  40 mg Oral QHS  . sucralfate  1 g Oral TID WC & HS   Continuous Infusions: . dextrose 5 % and 0.9 % NaCl  with KCl 40 mEq/L 75 mL/hr at 07/20/18 0523   PRN Meds:.prochlorperazine, traZODone   ASSESMENT:   *  N/v, anorexia, dysphagia, weight loss, FTT of unclear cause. EGD 3/17 with benign ulcer at GEjx Ba swallow with minor esoph spasm.  CT head unremarkable (chronic mV dz and atrophy)).  Normal bedside swallow and MBS.   On complicated cholelithiasis on 05/30/18 ultrasound.  LFTs elevated in 04/2017 likely due to [place], but now normal since late February.    Protonix IV BID, qid Carafate, OTC low dose IV Reglan in place.    *   Chronic Plavix, low dose ASA.  Not on hold.   Hx CVA.    *   Stable normocytic anemia.     PLAN   *   Repeat EGD in 2 to 3 months to document ulcer healing.    *  Continue BID Protonix.  Switch to po  *   CT abdomen and pelvis ordered.  If this is unrevealing, may want to consider HIDA scan.    [name]  07/20/2018, 10:24 AM Phone [phone]     Attending physician's note   I have taken an interval history, reviewed the chart and examined the patient. I agree with the Advanced Practitioner's note, impression and recommendations.  Follow CT results.  Continue current meds   Carmell Austria, MD

## 2018-07-21 DIAGNOSIS — A0472 Enterocolitis due to Clostridium difficile, not specified as recurrent: Secondary | ICD-10-CM

## 2018-07-21 LAB — CBC
HCT: 33.4 % — ABNORMAL LOW (ref 36.0–46.0)
Hemoglobin: 9.9 g/dL — ABNORMAL LOW (ref 12.0–15.0)
MCH: 26.8 pg (ref 26.0–34.0)
MCHC: 29.6 g/dL — ABNORMAL LOW (ref 30.0–36.0)
MCV: 90.3 fL (ref 80.0–100.0)
Platelets: 251 10*3/uL (ref 150–400)
RBC: 3.7 MIL/uL — ABNORMAL LOW (ref 3.87–5.11)
RDW: 16.3 % — AB (ref 11.5–15.5)
WBC: 6.8 10*3/uL (ref 4.0–10.5)
nRBC: 0 % (ref 0.0–0.2)

## 2018-07-21 LAB — RETICULOCYTES
Immature Retic Fract: 18.7 % — ABNORMAL HIGH (ref 2.3–15.9)
RBC.: 4.05 MIL/uL (ref 3.87–5.11)
Retic Count, Absolute: 51.4 10*3/uL (ref 19.0–186.0)
Retic Ct Pct: 1.3 % (ref 0.4–3.1)

## 2018-07-21 LAB — BASIC METABOLIC PANEL
Anion gap: 9 (ref 5–15)
BUN: 7 mg/dL — ABNORMAL LOW (ref 8–23)
CO2: 22 mmol/L (ref 22–32)
Calcium: 8.2 mg/dL — ABNORMAL LOW (ref 8.9–10.3)
Chloride: 106 mmol/L (ref 98–111)
Creatinine, Ser: 0.99 mg/dL (ref 0.44–1.00)
GFR calc Af Amer: >60 mL/min (ref >60)
GFR calc non Af Amer: 53 mL/min — ABNORMAL LOW (ref >60)
Glucose, Bld: 119 mg/dL — ABNORMAL HIGH (ref 70–99)
Potassium: 4.4 mmol/L (ref 3.5–5.1)
Sodium: 137 mmol/L (ref 135–145)

## 2018-07-21 LAB — FERRITIN: FERRITIN: 212 ng/mL (ref 11–307)

## 2018-07-21 LAB — IRON AND TIBC
Iron: 25 ug/dL — ABNORMAL LOW (ref 28–170)
Saturation Ratios: 14 % (ref 10.4–31.8)
TIBC: 176 ug/dL — ABNORMAL LOW (ref 250–450)
UIBC: 151 ug/dL

## 2018-07-21 LAB — FOLATE: Folate: 10.8 ng/mL (ref >5.9)

## 2018-07-21 LAB — VITAMIN B12: Vitamin B-12: 285 pg/mL (ref 180–914)

## 2018-07-21 LAB — C DIFFICILE QUICK SCREEN W PCR REFLEX
C Diff antigen: POSITIVE — AB
C Diff interpretation: DETECTED
C Diff toxin: POSITIVE — AB

## 2018-07-21 MED ORDER — VANCOMYCIN 50 MG/ML ORAL SOLUTION
125.0000 mg | Freq: Four times a day (QID) | ORAL | Status: DC
  Administered 2018-07-21 – 2018-07-24 (×13): 125 mg via ORAL
  Filled 2018-07-21 (×14): qty 2.5

## 2018-07-21 MED ORDER — SACCHAROMYCES BOULARDII 250 MG PO CAPS
250.0000 mg | ORAL_CAPSULE | Freq: Two times a day (BID) | ORAL | Status: DC
  Administered 2018-07-21 – 2018-07-24 (×6): 250 mg via ORAL
  Filled 2018-07-21 (×6): qty 1

## 2018-07-21 MED ORDER — ONDANSETRON HCL 4 MG/2ML IJ SOLN
4.0000 mg | Freq: Four times a day (QID) | INTRAMUSCULAR | Status: DC
  Administered 2018-07-21 – 2018-07-22 (×4): 4 mg via INTRAVENOUS
  Filled 2018-07-21 (×4): qty 2

## 2018-07-21 NOTE — Progress Notes (Addendum)
Patient ID: Kenn File, female   DOB: May 13, 1933, 85 y.o.   MRN: [id number]     Progress Note   Subjective   Remains nauseated , not asking for antiemetic - not taking any po's due to nausea- started having diarrhea yesterday - 3 x this am - loose dark brown  No c/o pain or cramping CT abd yesterday -gallstones , no cholecystitis- thickened wall of majority of Colon  With associated pericolonic inflammation c/w acute colitis   Objective   Vital signs in last 24 hours: Temp:  [97.9 F (36.6 C)-100.1 F (37.8 C)] 100.1 F (37.8 C) (03/23 0519) Pulse Rate:  [59-82] 62 (03/23 0519) Resp:  [18-22] 18 (03/23 0519) BP: (127-133)/(67-81) 127/76 (03/23 0519) SpO2:  [96 %-100 %] 96 % (03/23 0519) Last BM Date: 07/20/18 General:   Elderly AA female female in NAD Heart:  Regular rate and rhythm; no murmurs Lungs: Respirations even and unlabored, lungs CTA bilaterally Abdomen:  Soft, nontender and nondistended. Normal bowel sounds. Extremities:  Without edema. Neurologic:  Alert and oriented,  grossly normal neurologically. Psych:  Cooperative. Normal mood and affect.  Intake/Output from previous day: 03/22 0701 - 03/23 0700 In: 1855.1 [I.V.:1855.1] Out: -  Intake/Output this shift: No intake/output data recorded.  Lab Results: Recent Labs    07/19/18 0337 07/20/18 0458 07/21/18 0437  WBC 6.9 5.1 6.8  HGB 9.1* 9.7* 9.9*  HCT 30.7* 32.1* 33.4*  PLT 221 205 251   BMET No results for input(s): NA, K, CL, CO2, GLUCOSE, BUN, CREATININE, CALCIUM in the last 72 hours. LFT No results for input(s): PROT, ALBUMIN, AST, ALT, ALKPHOS, BILITOT, BILIDIR, IBILI in the last 72 hours. PT/INR No results for input(s): LABPROT, INR in the last 72 hours.  Studies/Results: Ct Abdomen Pelvis W Contrast  Result Date: 07/20/2018 CLINICAL DATA:  Persistent nausea and vomiting. Loss of appetite. EXAM: CT ABDOMEN AND PELVIS WITH CONTRAST TECHNIQUE: Multidetector CT imaging of the abdomen and  pelvis was performed using the standard protocol following bolus administration of intravenous contrast. CONTRAST:  15mL OMNIPAQUE IOHEXOL 300 MG/ML SOLN, 189mL OMNIPAQUE IOHEXOL 300 MG/ML SOLN COMPARISON:  None. FINDINGS: Lower chest: No acute abnormality. Hepatobiliary: Cholelithiasis without evidence of acute cholecystitis. No focal liver abnormality. No bile duct dilatation seen. Pancreas: Unremarkable. No pancreatic ductal dilatation or surrounding inflammatory changes. Spleen: Normal in size without focal abnormality. Adrenals/Urinary Tract: Bilateral renal cysts. Mass within the lower pole of the RIGHT kidney measures 2.5 cm, with possible enhancing component. No hydronephrosis. No ureteral or bladder calculi identified. Bladder is decompressed. Stomach/Bowel: Thickening of the walls of the majority of the colon, most prominent/definitive within the descending and sigmoid colon, probable additional thickening of the ascending colon/cecum. Associated pericolonic inflammation/fluid stranding, again most prominent along the walls of the descending and sigmoid colon. No dilated large or small bowel loops. Stomach is unremarkable, partially decompressed. Vascular/Lymphatic: Aortic atherosclerosis. No acute appearing vascular abnormality. No enlarged lymph nodes seen. Reproductive: Uterus and bilateral adnexa are unremarkable. Other: Small amount of free fluid in the lower pelvis. No abscess collection seen. No free intraperitoneal air. Musculoskeletal: Degenerative changes throughout the slightly scoliotic thoracolumbar spine, mild to moderate in degree. No acute or suspicious osseous finding. IMPRESSION: 1. Thickening of the walls of the majority of the colon, most prominent/definitive within the descending and sigmoid colon, with associated pericolonic inflammation/fluid stranding. Findings are consistent with colitis of infectious or inflammatory nature. 2. **An incidental finding of potential clinical  significance has been found. 2.5 cm mass  within the lower pole of the right kidney, suspicious for renal cell carcinoma. Recommend further characterization with nonemergent renal MRI.** 3. Cholelithiasis without evidence of acute cholecystitis. Aortic Atherosclerosis (ICD10-I70.0). Electronically Signed   By: Franki Cabot M.D.   On: 07/20/2018 13:14       Assessment / Plan:    #1 85 yo AA female with persistent nausea - day # 7 hospital stay  No vomiting, but unable to take Po's   Diarrhea past couple days  Interesting no c/o pain but CT shows a diffuse Colitis - etiology not clear -will need to r/o infectious etiologies   #2 GE junction Ulcer  Found at EGD   #3 AKI- resolved  #4 anemia -stable  Plan; Change Zofran to scheduled dosing IV Continue BID PPI  Stop Reglan  Check GI path panel, and Cdiff  Continue Clears with supplements         Active Problems:   HYPERTENSION, HEART CONTROLLED W/O ASSOC CHF   AV BLOCK, COMPLETE   Pacemaker-Medtronic   Daytime somnolence   Essential hypertension   Hyperlipidemia   AKI (acute kidney injury) (HCC)   Loss of weight   Hypokalemia   Low blood magnesium   Vomiting   Cough   Ileus (HCC)   Malnutrition of moderate degree     LOS: 6 days   Amy Esterwood  07/21/2018, 10:18 AM    Attending physician's note   I have taken an interval history, reviewed the chart and examined the patient. I agree with the Advanced Practitioner's note, impression and recommendations.   Had diarrhea, positive for C. Difficile.  CT showing acute colitis with some pericolonic inflammation. Plan: Vancomycin 125mg  po QID x 10 days.  Contact isolation.  Stop Reglan.  Decrease PPIs to once a day.  Will follow along.  Carmell Austria, MD

## 2018-07-21 NOTE — Progress Notes (Signed)
Physical Therapy Treatment Patient Details Name: [name] MRN: [id number] DOB: [birth year] Today's Date: 07/21/2018    History of Present Illness Pt is a 85 y.o. female admitted 3/15 with nausea, vomiting, and weakness. Pt had been at [facility] previously after a fall resulting in rhabdomyolysis and acute on chronic CKD. Pt had been home one day, then returned to ED for this hospitalization. PMH includes pacemaker, HTN, CVAx3 with L weakness per pt report, CHF, HLD.    PT Comments    Pt with decreased activity tolerance this session, only able to ambulate 35 ft total with RW. Pt also required assist for toileting/pericare. Pt fatigued quickly with LE exercise and ambulation. PT updated d/c recommendation to SNF placement, as pt lives alone and is struggling moreso with mobility this session vs last week. Pt also voices to PT that she does not feel she is safe to d/c home alone at this time. PT to continue to follow acutely.    Follow Up Recommendations  Supervision for mobility/OOB;SNF     Equipment Recommendations  None recommended by PT    Recommendations for Other Services       Precautions / Restrictions Precautions Precautions: Fall;ICD/Pacemaker Restrictions Weight Bearing Restrictions: No    Mobility  Bed Mobility Overal bed mobility: Needs Assistance             General bed mobility comments: Pt up in chair upon arrival and requesting to stay in chair upon PT exit.   Transfers Overall transfer level: Needs assistance Equipment used: Rolling walker (2 wheeled) Transfers: Sit to/from Stand Sit to Stand: Supervision         General transfer comment: supervision for safety. Sit to stand from recliner and toilet, as well as for LE strengthening intervention x10.  Ambulation/Gait Ambulation/Gait assistance: Min guard Gait Distance (Feet): 35 Feet(2x10 ft, 1x15 ft) Assistive device: Rolling walker (2 wheeled) Gait Pattern/deviations: Step-through  pattern;Decreased stride length;Trunk flexed Gait velocity: decr    General Gait Details: Min guard for safety. Verbal cuing for upright posture, positioning in RW x3.   Stairs             Wheelchair Mobility    Modified Rankin (Stroke Patients Only)       Balance Overall balance assessment: Needs assistance;History of Falls Sitting-balance support: No upper extremity supported Sitting balance-Leahy Scale: Good     Standing balance support: Bilateral upper extremity supported Standing balance-Leahy Scale: Fair                              Cognition Arousal/Alertness: Awake/alert Behavior During Therapy: WFL for tasks assessed/performed Overall Cognitive Status: Within Functional Limits for tasks assessed                                 General Comments: Pt continues to be frustrated about not being able to eat without N/V      Exercises General Exercises - Lower Extremity Long Arc Quad: AROM;Both;Seated;10 reps Hip Flexion/Marching: AROM;Both;10 reps;Seated Mini-Sqauts: AROM;Both;10 reps;Seated;Standing(sit to stands from recliner)    General Comments        Pertinent Vitals/Pain Pain Assessment: No/denies pain Pain Intervention(s): Monitored during session;Repositioned    Home Living                      Prior Function  PT Goals (current goals can now be found in the care plan section) Acute Rehab PT Goals Patient Stated Goal: be independent as long as possible PT Goal Formulation: With patient Time For Goal Achievement: 07/29/18 Potential to Achieve Goals: Good Progress towards PT goals: Progressing toward goals    Frequency    Min 3X/week      PT Plan Discharge plan needs to be updated    Co-evaluation              AM-PAC PT "6 Clicks" Mobility   Outcome Measure  Help needed turning from your back to your side while in a flat bed without using bedrails?: A Little Help needed  moving from lying on your back to sitting on the side of a flat bed without using bedrails?: A Lot Help needed moving to and from a bed to a chair (including a wheelchair)?: A Little Help needed standing up from a chair using your arms (e.g., wheelchair or bedside chair)?: A Little Help needed to walk in hospital room?: A Little Help needed climbing 3-5 steps with a railing? : A Lot 6 Click Score: 16    End of Session Equipment Utilized During Treatment: Gait belt Activity Tolerance: Patient tolerated treatment well Patient left: in chair;with call bell/phone within reach;with chair alarm set Nurse Communication: Mobility status PT Visit Diagnosis: Other abnormalities of gait and mobility (R26.89);Difficulty in walking, not elsewhere classified (R26.2);History of falling (Z91.81)     Time: 2130-8657 PT Time Calculation (min) (ACUTE ONLY): 27 min  Charges:  $Gait Training: 8-22 mins $Therapeutic Exercise: 8-22 mins                     Julien Girt, PT Acute Rehabilitation Services Pager [phone]  Office [place] 07/21/2018, 5:38 PM

## 2018-07-21 NOTE — Progress Notes (Signed)
PROGRESS NOTE    EMMACLAIRE SWITALA  VZD:[id number] DOB: [birth year] DOA: 07/13/2018 PCP: Randel Books, FNP   Brief Narrative: [name] is a 85 y.o. female with a history of complete heart block s/p pacemaker, hyperlipidemia, CKD stage III, stroke. She presented secondary to recurrent/intractable nausea and vomiting.   Assessment & Plan:   Active Problems:   HYPERTENSION, HEART CONTROLLED W/O ASSOC CHF   AV BLOCK, COMPLETE   Pacemaker-Medtronic   Daytime somnolence   Essential hypertension   Hyperlipidemia   AKI (acute kidney injury) (HCC)   Loss of weight   Hypokalemia   Low blood magnesium   Vomiting   Cough   Ileus (HCC)   Malnutrition of moderate degree   Intractable nausea/vomiting Unknown etiology. Possibly esophageal. S/p EGD on 3/17 significant for 2 gastric ulcers. History of strokes. CT head negative for acute stroke. No new neurologic symptoms. Was able to keep intake down yesterday. Does not like shakes. Spitting up but no nausea. CT scan significant for enteritis -GI recommendations: PPI, carafate, scheduled zofran. Reglan, C. Diff/GI pathogen panel to rule out infectious etiology -Encouraging patient to attempt oral intake -Continue IV fluids  Right kidney mass 2.5 cm. Incidental. Concern for possible renal cell carcinoma. Discussed with patient. Will need outpatient urology follow-up.  Complete heart block S/p pacemaker. Stable.  History of stroke -Continue aspirin and Plavix  Hyperlipidemia -Continue Zocor  Hypokalemia/hypomagnesemia Secondary to vomiting. Supplementation given. Stable  Fever Unknown etiology. No source. Resolved.  Anemia Acute. Unknown etiology. Stable. No hematochezia or melena -Anemia panel  CKD stage III Stable.   DVT prophylaxis: Lovenox Code Status:   Code Status: Full Code Family Communication: None at bedside Disposition Plan: Discharge pending GI management   Consultants:    Gastroenterology  Procedures:   EGD (07/15/2018) Impression:       - Non-bleeding esophageal ulcer. Biopsied. 2 pieces                            from the edge of this black discolored ulcer crater                           - Small hiatal hernia.                           - Tortuous esophagus.                           - The examination was otherwise normal.  Recommendation:                                      - Continue present medications.                           - Clear liquid diet.                           - make PPI bid                           add sucralfate  add reflux precautions                           consider CNS eval, additional strokes as a cause of                            nause and vomiting                           Hard to say if this ulcer is a cause or a result                            (favor latter) of process                           No evidence for an obstructive process - I suppose                            she could be having dysphagia interpreted as                            vomiting and then this ulcer and perhaps a spastic                            esophagus might be the cause                           we will f/u  Antimicrobials:  None    Subjective: Still hesitant to eat as she vomiting as soon as she took by mouth yesterday.  Objective: Vitals:   07/20/18 0519 07/20/18 1327 07/20/18 2056 07/21/18 0519  BP: 124/65 133/81 129/67 127/76  Pulse: (!) 59 82 (!) 59 62  Resp: 16 (!) 22 18 18   Temp: 98 F (36.7 C) 97.9 F (36.6 C) 98.4 F (36.9 C) 100.1 F (37.8 C)  TempSrc: Oral Oral Oral Oral  SpO2: 97% 100% 100% 96%  Weight:      Height:        Intake/Output Summary (Last 24 hours) at 07/21/2018 1131 Last data filed at 07/21/2018 0618 Gross per 24 hour  Intake 1506.35 ml  Output -  Net 1506.35 ml   Filed Weights   07/18/18 0531 07/19/18 0645 07/20/18 0516  Weight: 89.4 kg 88.8 kg 89.2 kg     Examination:  General exam: Appears calm and comfortable Respiratory system: Clear to auscultation. Respiratory effort normal. Cardiovascular system: S1 & S2 heard, RRR. No murmurs, rubs, gallops or clicks. Gastrointestinal system: Abdomen is nondistended, soft and nontender. No organomegaly or masses felt. Normal bowel sounds heard. Central nervous system: Alert and oriented. No focal neurological deficits. Extremities: Trace LE edema. No calf tenderness Skin: No cyanosis. No rashes Psychiatry: Judgement and insight appear normal. Mood & affect appropriate.    Data Reviewed: I have personally reviewed following labs and imaging studies  CBC: Recent Labs  Lab 07/17/18 0355 07/18/18 0337 07/19/18 0337 07/20/18 0458 07/21/18 0437  WBC 6.6 8.0 6.9 5.1 6.8  HGB 9.9* 9.6* 9.1* 9.7* 9.9*  HCT 34.4* 34.1* 30.7* 32.1* 33.4*  MCV 92.7 96.1 90.8 90.4 90.3  PLT 240 218 221 205  867   Basic Metabolic Panel: Recent Labs  Lab 07/15/18 0358 07/16/18 0321 07/18/18 0337  NA 145 144 144  K 3.3* 4.2 4.1  CL 116* 117* 118*  CO2 21* 20* 21*  GLUCOSE 78 95 102*  BUN 12 10 8   CREATININE 0.72 0.71 0.91  CALCIUM 7.8* 7.8* 8.1*  MG 1.8  --   --    GFR: Estimated Creatinine Clearance: 49.4 mL/min (by C-G formula based on SCr of 0.91 mg/dL). Liver Function Tests: No results for input(s): AST, ALT, ALKPHOS, BILITOT, PROT, ALBUMIN in the last 168 hours. No results for input(s): LIPASE, AMYLASE in the last 168 hours. No results for input(s): AMMONIA in the last 168 hours. Coagulation Profile: No results for input(s): INR, PROTIME in the last 168 hours. Cardiac Enzymes: Recent Labs  Lab 07/14/18 1349  TROPONINI <0.03   BNP (last 3 results) No results for input(s): PROBNP in the last 8760 hours. HbA1C: No results for input(s): HGBA1C in the last 72 hours. CBG: No results for input(s): GLUCAP in the last 168 hours. Lipid Profile: No results for input(s): CHOL, HDL, LDLCALC, TRIG,  CHOLHDL, LDLDIRECT in the last 72 hours. Thyroid Function Tests: No results for input(s): TSH, T4TOTAL, FREET4, T3FREE, THYROIDAB in the last 72 hours. Anemia Panel: No results for input(s): VITAMINB12, FOLATE, FERRITIN, TIBC, IRON, RETICCTPCT in the last 72 hours. Sepsis Labs: No results for input(s): PROCALCITON, LATICACIDVEN in the last 168 hours.  Recent Results (from the past 240 hour(s))  Urine Culture     Status: Abnormal   Collection Time: 07/13/18  2:20 PM  Result Value Ref Range Status   Specimen Description   Final    URINE, RANDOM Performed at [facility] [address], [place], [place] [address]    Special Requests   Final    NONE Performed at [facility], [place] [address] [place], [place] [address]    Culture MULTIPLE SPECIES PRESENT, SUGGEST RECOLLECTION (A)  Final   Report Status 07/14/2018 FINAL  Final         Radiology Studies: Ct Abdomen Pelvis W Contrast  Result Date: 07/20/2018 CLINICAL DATA:  Persistent nausea and vomiting. Loss of appetite. EXAM: CT ABDOMEN AND PELVIS WITH CONTRAST TECHNIQUE: Multidetector CT imaging of the abdomen and pelvis was performed using the standard protocol following bolus administration of intravenous contrast. CONTRAST:  10mL OMNIPAQUE IOHEXOL 300 MG/ML SOLN, 121mL OMNIPAQUE IOHEXOL 300 MG/ML SOLN COMPARISON:  None. FINDINGS: Lower chest: No acute abnormality. Hepatobiliary: Cholelithiasis without evidence of acute cholecystitis. No focal liver abnormality. No bile duct dilatation seen. Pancreas: Unremarkable. No pancreatic ductal dilatation or surrounding inflammatory changes. Spleen: Normal in size without focal abnormality. Adrenals/Urinary Tract: Bilateral renal cysts. Mass within the lower pole of the RIGHT kidney measures 2.5 cm, with possible enhancing component. No hydronephrosis. No ureteral or bladder calculi identified. Bladder is decompressed. Stomach/Bowel: Thickening of the walls  of the majority of the colon, most prominent/definitive within the descending and sigmoid colon, probable additional thickening of the ascending colon/cecum. Associated pericolonic inflammation/fluid stranding, again most prominent along the walls of the descending and sigmoid colon. No dilated large or small bowel loops. Stomach is unremarkable, partially decompressed. Vascular/Lymphatic: Aortic atherosclerosis. No acute appearing vascular abnormality. No enlarged lymph nodes seen. Reproductive: Uterus and bilateral adnexa are unremarkable. Other: Small amount of free fluid in the lower pelvis. No abscess collection seen. No free intraperitoneal air. Musculoskeletal: Degenerative changes throughout the slightly scoliotic thoracolumbar spine, mild to moderate in  degree. No acute or suspicious osseous finding. IMPRESSION: 1. Thickening of the walls of the majority of the colon, most prominent/definitive within the descending and sigmoid colon, with associated pericolonic inflammation/fluid stranding. Findings are consistent with colitis of infectious or inflammatory nature. 2. **An incidental finding of potential clinical significance has been found. 2.5 cm mass within the lower pole of the right kidney, suspicious for renal cell carcinoma. Recommend further characterization with nonemergent renal MRI.** 3. Cholelithiasis without evidence of acute cholecystitis. Aortic Atherosclerosis (ICD10-I70.0). Electronically Signed   By: Franki Cabot M.D.   On: 07/20/2018 13:14        Scheduled Meds: . aspirin EC  81 mg Oral Daily  . clopidogrel  75 mg Oral Q breakfast  . enoxaparin (LOVENOX) injection  40 mg Subcutaneous Q24H  . feeding supplement  1 Container Oral TID BM  . latanoprost  1 drop Both Eyes QHS  . metoCLOPramide (REGLAN) injection  5 mg Intravenous TID AC  . ondansetron  4 mg Intravenous Q6H  . pantoprazole (PROTONIX) IV  40 mg Intravenous Q12H  . simvastatin  40 mg Oral QHS  . sucralfate  1 g  Oral TID WC & HS   Continuous Infusions: . dextrose 5 % and 0.9 % NaCl with KCl 40 mEq/L 75 mL/hr at 07/20/18 1940     LOS: 6 days     Cordelia Poche, MD [facility] 07/21/2018, 11:31 AM  If 7PM-7AM, please contact night-coverage [email or web address]

## 2018-07-21 NOTE — Care Management Important Message (Signed)
Important Message  Patient Details  Name: [name] MRN: [id number] Date of Birth: November 16, 1933   Medicare Important Message Given:  Yes    Kerin Salen 07/21/2018, 12:03 [place] Message  Patient Details  Name: [name] MRN: [id number] Date of Birth: [birth year]   Medicare Important Message Given:  Yes    Kerin Salen 07/21/2018, 12:03 PM

## 2018-07-21 NOTE — Progress Notes (Signed)
C-diff positive, Amy Esterwood updated and orders noted. SRP, RN

## 2018-07-22 LAB — GASTROINTESTINAL PANEL BY PCR, STOOL (REPLACES STOOL CULTURE)

## 2018-07-22 LAB — CBC
HEMATOCRIT: 32.1 % — AB (ref 36.0–46.0)
Hemoglobin: 9.5 g/dL — ABNORMAL LOW (ref 12.0–15.0)
MCH: 26.8 pg (ref 26.0–34.0)
MCHC: 29.6 g/dL — ABNORMAL LOW (ref 30.0–36.0)
MCV: 90.7 fL (ref 80.0–100.0)
Platelets: 233 10*3/uL (ref 150–400)
RBC: 3.54 MIL/uL — ABNORMAL LOW (ref 3.87–5.11)
RDW: 16.2 % — ABNORMAL HIGH (ref 11.5–15.5)
WBC: 7.2 10*3/uL (ref 4.0–10.5)
nRBC: 0 % (ref 0.0–0.2)

## 2018-07-22 MED ORDER — PANTOPRAZOLE SODIUM 40 MG PO TBEC
40.0000 mg | DELAYED_RELEASE_TABLET | Freq: Two times a day (BID) | ORAL | Status: DC
  Administered 2018-07-22 – 2018-07-23 (×3): 40 mg via ORAL
  Filled 2018-07-22 (×3): qty 1

## 2018-07-22 MED ORDER — ONDANSETRON HCL 4 MG PO TABS
4.0000 mg | ORAL_TABLET | Freq: Four times a day (QID) | ORAL | Status: DC
  Administered 2018-07-22 – 2018-07-24 (×7): 4 mg via ORAL
  Filled 2018-07-22 (×8): qty 1

## 2018-07-22 NOTE — Progress Notes (Signed)
Physical Therapy Treatment Patient Details Name: [name] MRN: [id number] DOB: [birth year] Today's Date: 07/22/2018    History of Present Illness Pt is a 85 y.o. female admitted 3/15 with nausea, vomiting, and weakness. Pt had been at [facility] previously after a fall resulting in rhabdomyolysis and acute on chronic CKD. Pt had been home one day, then returned to ED for this hospitalization. PMH includes pacemaker, HTN, CVAx3 with L weakness per pt report, CHF, HLD.    PT Comments    Pt performed ambulation and LE exercise circuit well this session, PT utilized circuit format to increase pt's cardiovascular endurance for activity. Pt limited by fatigue this session. Pt required one trip to bathroom this session, but able to perform pericare for self this session. PT to continue to follow acutely, will progress mobility as tolerated.    Follow Up Recommendations  Supervision for mobility/OOB;SNF     Equipment Recommendations  None recommended by PT    Recommendations for Other Services       Precautions / Restrictions Precautions Precautions: Fall;ICD/Pacemaker Restrictions Weight Bearing Restrictions: No    Mobility  Bed Mobility Overal bed mobility: Needs Assistance             General bed mobility comments: Pt up in chair upon arrival and requesting to stay in chair upon PT exit.   Transfers Overall transfer level: Needs assistance Equipment used: Rolling walker (2 wheeled) Transfers: Sit to/from Stand Sit to Stand: Min guard;Min assist         General transfer comment: Min guard for safety when standing from recliner during sit to stand exercise and when transferring to prepare to walk. Min assist for completion of rising and steadying upon standing when transferring from toilet.   Ambulation/Gait Ambulation/Gait assistance: Min guard Gait Distance (Feet): 60 Feet(3x20 ft circuits of ambulation ) Assistive device: Rolling walker (2 wheeled) Gait  Pattern/deviations: Step-through pattern;Decreased stride length;Trunk flexed Gait velocity: decr    General Gait Details: Min guard for safety. Frequent verbal cuing for upright posture, positioning in RW. Pt with increased time to turn with RW.    Stairs             Wheelchair Mobility    Modified Rankin (Stroke Patients Only)       Balance Overall balance assessment: Needs assistance;History of Falls Sitting-balance support: No upper extremity supported Sitting balance-Leahy Scale: Good     Standing balance support: Bilateral upper extremity supported Standing balance-Leahy Scale: Fair Standing balance comment: able to stand without UE support, relies on RW for dynamic activity                            Cognition Arousal/Alertness: Awake/alert Behavior During Therapy: WFL for tasks assessed/performed Overall Cognitive Status: Within Functional Limits for tasks assessed                                 General Comments: Pt very pleasant and funny with PT      Exercises General Exercises - Lower Extremity Long Arc Quad: AROM;Both;Seated;15 reps Mini-Sqauts: AROM;Both;10 reps;Seated;Standing(sit to stands in recliner, 2x5 repetitions. Pt limited by fatigue)    General Comments        Pertinent Vitals/Pain Pain Assessment: No/denies pain Pain Intervention(s): Monitored during session;Limited activity within patient's tolerance    Home Living  Prior Function            PT Goals (current goals can now be found in the care plan section) Acute Rehab PT Goals Patient Stated Goal: be independent as long as possible PT Goal Formulation: With patient Time For Goal Achievement: 07/29/18 Potential to Achieve Goals: Good Progress towards PT goals: Progressing toward goals    Frequency    Min 3X/week      PT Plan Current plan remains appropriate    Co-evaluation              AM-PAC PT "6  Clicks" Mobility   Outcome Measure  Help needed turning from your back to your side while in a flat bed without using bedrails?: A Little Help needed moving from lying on your back to sitting on the side of a flat bed without using bedrails?: A Lot Help needed moving to and from a bed to a chair (including a wheelchair)?: A Little Help needed standing up from a chair using your arms (e.g., wheelchair or bedside chair)?: A Little Help needed to walk in hospital room?: A Little Help needed climbing 3-5 steps with a railing? : A Lot 6 Click Score: 16    End of Session Equipment Utilized During Treatment: Gait belt Activity Tolerance: Patient tolerated treatment well;Patient limited by fatigue Patient left: in chair;with call bell/phone within reach;with chair alarm set Nurse Communication: Mobility status PT Visit Diagnosis: Other abnormalities of gait and mobility (R26.89);Difficulty in walking, not elsewhere classified (R26.2);History of falling (Z91.81)     Time: 1430-1501 PT Time Calculation (min) (ACUTE ONLY): 31 min  Charges:  $Gait Training: 8-22 mins $Therapeutic Exercise: 8-22 mins                     Julien Girt, PT Acute Rehabilitation Services Pager [phone]  Office [place] 07/22/2018, 3:33 PM

## 2018-07-22 NOTE — Progress Notes (Addendum)
[facility] Progress Note  CC:  Nausea   Subjective: No abdominal pain. No significant nausea. No vomiting. Tolerating a full liquid diet. Tolerating po Vanco for C. Diff. Passed a solid stool this morning.   Objective:   Vital signs in last 24 hours: Temp:  [98 F (36.7 C)-98.8 F (37.1 C)] 98.8 F (37.1 C) (03/24 0423) Pulse Rate:  [59-63] 59 (03/24 0423) Resp:  [16-17] 16 (03/24 0423) BP: (111-135)/(65-67) 117/65 (03/24 0423) SpO2:  [96 %-100 %] 96 % (03/24 0423) Weight:  [81.3 kg] 81.3 kg (03/24 0351) Last BM Date: 07/21/18 General:   Alert,  Well-developed,    in NAD Heart:  Regular rate and rhythm; no murmurs Pulm: Lungs clear, diminished bases Abdomen:  Soft, nontender and nondistended. Normal bowel sounds, without guarding, and without rebound.   Extremities:  Without edema. Neurologic:  Alert and  oriented x4;  grossly normal neurologically. Psych:  Alert and cooperative. Normal mood and affect.  Intake/Output from previous day: 03/23 0701 - 03/24 0700 In: 1638.9 [P.O.:240; I.V.:1398.9] Out: -  Intake/Output this shift: No intake/output data recorded.  Lab Results: Recent Labs    07/20/18 0458 07/21/18 0437 07/22/18 0451  WBC 5.1 6.8 7.2  HGB 9.7* 9.9* 9.5*  HCT 32.1* 33.4* 32.1*  PLT 205 251 233   BMET Recent Labs    07/21/18 1239  NA 137  K 4.4  CL 106  CO2 22  GLUCOSE 119*  BUN 7*  CREATININE 0.99  CALCIUM 8.2*   LFT No results for input(s): PROT, ALBUMIN, AST, ALT, ALKPHOS, BILITOT, BILIDIR, IBILI in the last 72 hours. PT/INR No results for input(s): LABPROT, INR in the last 72 hours. Hepatitis Panel No results for input(s): HEPBSAG, HCVAB, HEPAIGM, HEPBIGM in the last 72 hours.  Ct Abdomen Pelvis W Contrast  Result Date: 07/20/2018 CLINICAL DATA:  Persistent nausea and vomiting. Loss of appetite. EXAM: CT ABDOMEN AND PELVIS WITH CONTRAST TECHNIQUE: Multidetector CT imaging of the abdomen and pelvis was performed  using the standard protocol following bolus administration of intravenous contrast. CONTRAST:  52mL OMNIPAQUE IOHEXOL 300 MG/ML SOLN, 111mL OMNIPAQUE IOHEXOL 300 MG/ML SOLN COMPARISON:  None. FINDINGS: Lower chest: No acute abnormality. Hepatobiliary: Cholelithiasis without evidence of acute cholecystitis. No focal liver abnormality. No bile duct dilatation seen. Pancreas: Unremarkable. No pancreatic ductal dilatation or surrounding inflammatory changes. Spleen: Normal in size without focal abnormality. Adrenals/Urinary Tract: Bilateral renal cysts. Mass within the lower pole of the RIGHT kidney measures 2.5 cm, with possible enhancing component. No hydronephrosis. No ureteral or bladder calculi identified. Bladder is decompressed. Stomach/Bowel: Thickening of the walls of the majority of the colon, most prominent/definitive within the descending and sigmoid colon, probable additional thickening of the ascending colon/cecum. Associated pericolonic inflammation/fluid stranding, again most prominent along the walls of the descending and sigmoid colon. No dilated large or small bowel loops. Stomach is unremarkable, partially decompressed. Vascular/Lymphatic: Aortic atherosclerosis. No acute appearing vascular abnormality. No enlarged lymph nodes seen. Reproductive: Uterus and bilateral adnexa are unremarkable. Other: Small amount of free fluid in the lower pelvis. No abscess collection seen. No free intraperitoneal air. Musculoskeletal: Degenerative changes throughout the slightly scoliotic thoracolumbar spine, mild to moderate in degree. No acute or suspicious osseous finding. IMPRESSION: 1. Thickening of the walls of the majority of the colon, most prominent/definitive within the descending and sigmoid colon, with associated pericolonic inflammation/fluid stranding. Findings are consistent with colitis of infectious or inflammatory nature. 2. **An incidental finding of potential clinical significance has  been found.  2.5 cm mass within the lower pole of the right kidney, suspicious for renal cell carcinoma. Recommend further characterization with nonemergent renal MRI.** 3. Cholelithiasis without evidence of acute cholecystitis. Aortic Atherosclerosis (ICD10-I70.0). Electronically Signed   By: Franki Cabot M.D.   On: 07/20/2018 13:14    Assessment / Plan: 1. 85 y.o. female with nausea and vomiting. EGD 3/17 showed  a benign 10 mm ulcer at GE junction. Esophagogram 06/28/2018 neg. Abdominal/Pelvic CT 3/23 showed gallstones, no cholecystitis, and colon wall thickening c/w acute colitis. C.diff antigen/toxin + started on po Vanco 125mg  qid -continue Vanco 125mg  po qid x 14 days -S. Boulardi 1 po bid -current IV infiltrated, to change Protonix to 40mg  po bid for now -Zofran as needed  -continue full liquid diet -will need repeat EGD in 8 to 12 weeks   2. Anemia Hg 9.5 << 9.9. HCT 32.1 << 33.4. Iron 25. TIBC 176. Vitamin B12 285. -follow H/H daily -monitor for GI bleeding -consider IV iron if not already given and Vitamin B12 injections  Further recommendations per Dr. Lyndel Safe     LOS: 7 days   Noralyn Pick  07/22/2018, 11:13 AM   Attending physician's note   I have taken an interval history, reviewed the chart and  Did not examined the patient. I agree with the Advanced Practitioner's note, impression and recommendations.   Patient with C. difficile colitis.  Doing well on vancomycin.  No further diarrhea this afternoon.  Nausea has improved.  Plan to advance diet.  Continue probiotics.  Trend CBC, labs. Can give IV iron/Vit B12.  Carmell Austria, MD

## 2018-07-22 NOTE — Progress Notes (Addendum)
PROGRESS NOTE    LURA FALOR  TFT:[id number] DOB: December 09, 1933 DOA: 07/13/2018 PCP: Randel Books, FNP   Brief Narrative: KYMBER KOSAR is a 85 y.o. female with a history of complete heart block s/p pacemaker, hyperlipidemia, CKD stage III, stroke. She presented secondary to recurrent/intractable nausea and vomiting.   Assessment & Plan:   Active Problems:   HYPERTENSION, HEART CONTROLLED W/O ASSOC CHF   AV BLOCK, COMPLETE   Pacemaker-[employer]   Daytime somnolence   Essential hypertension   Hyperlipidemia   AKI (acute kidney injury) (HCC)   Loss of weight   Hypokalemia   Low blood magnesium   Vomiting   Cough   Ileus (HCC)   Malnutrition of moderate degree   Enteritis due to Clostridium difficile   C. Diff colitis Intractable nausea/vomiting S/p EGD on 3/17 significant for 2 gastric ulcers. History of strokes. CT head negative for acute stroke. Was started on Reglan without much improvement. Developed loose stools while on Reglan. Afebrile with normal WBC. No abdominal pain. C. Difficile positive -Encouraging continue attempts at oral intake -Continue IV fluids -GI recommendations: Vancomycin PO for C. Difficile infection  Right kidney mass 2.5 cm. Incidental. Concern for possible renal cell carcinoma. Discussed with patient. Will need outpatient urology follow-up.  Complete heart block S/p pacemaker. Stable.  History of stroke -Continue aspirin and Plavix  Hyperlipidemia -Continue Zocor  Hypokalemia/hypomagnesemia Secondary to vomiting. Supplementation given. Stable  Fever On admission. Low grade. Possibly secondary to C. Difficile.  Anemia Acute drop possibly secondary to colitis. No hematochezia or melena. Anemia panel suggestive of chronic disease in setting of chronic kidney disease. Hemoglobin stable.  CKD stage III Stable.   DVT prophylaxis: Lovenox Code Status:   Code Status: Full Code Family Communication: None at  bedside Disposition Plan: Discharge pending GI management   Consultants:   Gastroenterology  Procedures:   EGD (07/15/2018) Impression:       - Non-bleeding esophageal ulcer. Biopsied. 2 pieces                            from the edge of this black discolored ulcer crater                           - Small hiatal hernia.                           - Tortuous esophagus.                           - The examination was otherwise normal.  Recommendation:                                      - Continue present medications.                           - Clear liquid diet.                           - make PPI bid                           add sucralfate  add reflux precautions                           consider CNS eval, additional strokes as a cause of                            nause and vomiting                           Hard to say if this ulcer is a cause or a result                            (favor latter) of process                           No evidence for an obstructive process - I suppose                            she could be having dysphagia interpreted as                            vomiting and then this ulcer and perhaps a spastic                            esophagus might be the cause                           we will f/u  Antimicrobials:  None    Subjective: C. Difficile tested positive. Patient is hoping for more solid foods as she does not like the liquid diet.  Objective: Vitals:   07/21/18 1831 07/21/18 1958 07/22/18 0351 07/22/18 0423  BP: 135/65 111/67  117/65  Pulse: 63 60  (!) 59  Resp: 17 16  16   Temp: 98 F (36.7 C) 98.8 F (37.1 C)  98.8 F (37.1 C)  TempSrc: Oral Oral  Oral  SpO2: 100% 98%  96%  Weight:   81.3 kg   Height:        Intake/Output Summary (Last 24 hours) at 07/22/2018 1012 Last data filed at 07/22/2018 0600 Gross per 24 hour  Intake 1518.86 ml  Output -  Net 1518.86 ml   Filed Weights   07/19/18  0645 07/20/18 0516 07/22/18 0351  Weight: 88.8 kg 89.2 kg 81.3 kg    Examination:  General exam: Appears calm and comfortable Respiratory system: Clear to auscultation. Respiratory effort normal. Cardiovascular system: S1 & S2 heard, RRR. No murmurs, rubs, gallops or clicks. Gastrointestinal system: Abdomen is nondistended, soft and nontender. No organomegaly or masses felt. Normal bowel sounds heard. Central nervous system: Alert and oriented. No focal neurological deficits. Extremities: No edema. No calf tenderness Skin: No cyanosis. No rashes Psychiatry: Judgement and insight appear normal. Mood & affect appropriate.    Data Reviewed: I have personally reviewed following labs and imaging studies  CBC: Recent Labs  Lab 07/18/18 0337 07/19/18 0337 07/20/18 0458 07/21/18 0437 07/22/18 0451  WBC 8.0 6.9 5.1 6.8 7.2  HGB 9.6* 9.1* 9.7* 9.9* 9.5*  HCT 34.1* 30.7* 32.1* 33.4* 32.1*  MCV 96.1 90.8 90.4 90.3 90.7  PLT 218 221 205 251 233  Basic Metabolic Panel: Recent Labs  Lab 07/16/18 0321 07/18/18 0337 07/21/18 1239  NA 144 144 137  K 4.2 4.1 4.4  CL 117* 118* 106  CO2 20* 21* 22  GLUCOSE 95 102* 119*  BUN 10 8 7*  CREATININE 0.71 0.91 0.99  CALCIUM 7.8* 8.1* 8.2*   GFR: Estimated Creatinine Clearance: 43.3 mL/min (by C-G formula based on SCr of 0.99 mg/dL). Liver Function Tests: No results for input(s): AST, ALT, ALKPHOS, BILITOT, PROT, ALBUMIN in the last 168 hours. No results for input(s): LIPASE, AMYLASE in the last 168 hours. No results for input(s): AMMONIA in the last 168 hours. Coagulation Profile: No results for input(s): INR, PROTIME in the last 168 hours. Cardiac Enzymes: No results for input(s): CKTOTAL, CKMB, CKMBINDEX, TROPONINI in the last 168 hours. BNP (last 3 results) No results for input(s): PROBNP in the last 8760 hours. HbA1C: No results for input(s): HGBA1C in the last 72 hours. CBG: No results for input(s): GLUCAP in the last 168  hours. Lipid Profile: No results for input(s): CHOL, HDL, LDLCALC, TRIG, CHOLHDL, LDLDIRECT in the last 72 hours. Thyroid Function Tests: No results for input(s): TSH, T4TOTAL, FREET4, T3FREE, THYROIDAB in the last 72 hours. Anemia Panel: Recent Labs    07/21/18 1239  VITAMINB12 285  FOLATE 10.8  FERRITIN 212  TIBC 176*  IRON 25*  RETICCTPCT 1.3   Sepsis Labs: No results for input(s): PROCALCITON, LATICACIDVEN in the last 168 hours.  Recent Results (from the past 240 hour(s))  Urine Culture     Status: Abnormal   Collection Time: 07/13/18  2:20 PM  Result Value Ref Range Status   Specimen Description   Final    URINE, RANDOM Performed at [facility] [address] [place], [place] [address]    Special Requests   Final    NONE Performed at [facility], [place] [address], [place], [place] [address]    Culture MULTIPLE SPECIES PRESENT, SUGGEST RECOLLECTION (A)  Final   Report Status 07/14/2018 FINAL  Final  C Difficile Quick Screen w PCR reflex     Status: Abnormal   Collection Time: 07/21/18  1:32 PM  Result Value Ref Range Status   C Diff antigen POSITIVE (A) NEGATIVE Final   C Diff toxin POSITIVE (A) NEGATIVE Final   C Diff interpretation Toxin producing C. difficile detected.  Final    Comment: CRITICAL RESULT CALLED TO, READ BACK BY AND VERIFIED WITH: S.PICKETT AT 5400 ON 07/21/18 BY N.THOMPSON Performed at [facility], [place] [address] [place], [place] [address]          Radiology Studies: Ct Abdomen Pelvis W Contrast  Result Date: 07/20/2018 CLINICAL DATA:  Persistent nausea and vomiting. Loss of appetite. EXAM: CT ABDOMEN AND PELVIS WITH CONTRAST TECHNIQUE: Multidetector CT imaging of the abdomen and pelvis was performed using the standard protocol following bolus administration of intravenous contrast. CONTRAST:  75mL OMNIPAQUE IOHEXOL 300 MG/ML SOLN, 110mL OMNIPAQUE IOHEXOL 300 MG/ML SOLN COMPARISON:   None. FINDINGS: Lower chest: No acute abnormality. Hepatobiliary: Cholelithiasis without evidence of acute cholecystitis. No focal liver abnormality. No bile duct dilatation seen. Pancreas: Unremarkable. No pancreatic ductal dilatation or surrounding inflammatory changes. Spleen: Normal in size without focal abnormality. Adrenals/Urinary Tract: Bilateral renal cysts. Mass within the lower pole of the RIGHT kidney measures 2.5 cm, with possible enhancing component. No hydronephrosis. No ureteral or bladder calculi identified. Bladder is decompressed. Stomach/Bowel: Thickening of the walls of the majority of the  colon, most prominent/definitive within the descending and sigmoid colon, probable additional thickening of the ascending colon/cecum. Associated pericolonic inflammation/fluid stranding, again most prominent along the walls of the descending and sigmoid colon. No dilated large or small bowel loops. Stomach is unremarkable, partially decompressed. Vascular/Lymphatic: Aortic atherosclerosis. No acute appearing vascular abnormality. No enlarged lymph nodes seen. Reproductive: Uterus and bilateral adnexa are unremarkable. Other: Small amount of free fluid in the lower pelvis. No abscess collection seen. No free intraperitoneal air. Musculoskeletal: Degenerative changes throughout the slightly scoliotic thoracolumbar spine, mild to moderate in degree. No acute or suspicious osseous finding. IMPRESSION: 1. Thickening of the walls of the majority of the colon, most prominent/definitive within the descending and sigmoid colon, with associated pericolonic inflammation/fluid stranding. Findings are consistent with colitis of infectious or inflammatory nature. 2. **An incidental finding of potential clinical significance has been found. 2.5 cm mass within the lower pole of the right kidney, suspicious for renal cell carcinoma. Recommend further characterization with nonemergent renal MRI.** 3. Cholelithiasis without  evidence of acute cholecystitis. Aortic Atherosclerosis (ICD10-I70.0). Electronically Signed   By: Franki Cabot M.D.   On: 07/20/2018 13:14        Scheduled Meds: . aspirin EC  81 mg Oral Daily  . clopidogrel  75 mg Oral Q breakfast  . enoxaparin (LOVENOX) injection  40 mg Subcutaneous Q24H  . feeding supplement  1 Container Oral TID BM  . latanoprost  1 drop Both Eyes QHS  . ondansetron  4 mg Intravenous Q6H  . pantoprazole (PROTONIX) IV  40 mg Intravenous Q12H  . saccharomyces boulardii  250 mg Oral BID  . simvastatin  40 mg Oral QHS  . vancomycin  125 mg Oral QID   Continuous Infusions: . dextrose 5 % and 0.9 % NaCl with KCl 40 mEq/L 75 mL/hr at 07/22/18 0224     LOS: 7 days     Cordelia Poche, MD [facility] 07/22/2018, 10:12 AM  If 7PM-7AM, please contact night-coverage [email or web address]

## 2018-07-23 LAB — CBC
HCT: 32.2 % — ABNORMAL LOW (ref 36.0–46.0)
Hemoglobin: 9.5 g/dL — ABNORMAL LOW (ref 12.0–15.0)
MCH: 26.8 pg (ref 26.0–34.0)
MCHC: 29.5 g/dL — ABNORMAL LOW (ref 30.0–36.0)
MCV: 91 fL (ref 80.0–100.0)
NRBC: 0 % (ref 0.0–0.2)
Platelets: 215 10*3/uL (ref 150–400)
RBC: 3.54 MIL/uL — ABNORMAL LOW (ref 3.87–5.11)
RDW: 16.3 % — ABNORMAL HIGH (ref 11.5–15.5)
WBC: 5.7 10*3/uL (ref 4.0–10.5)

## 2018-07-23 MED ORDER — PANTOPRAZOLE SODIUM 40 MG PO TBEC
40.0000 mg | DELAYED_RELEASE_TABLET | Freq: Every day | ORAL | Status: DC
  Administered 2018-07-24: 40 mg via ORAL
  Filled 2018-07-23: qty 1

## 2018-07-23 MED ORDER — PROMETHAZINE HCL 25 MG PO TABS
25.0000 mg | ORAL_TABLET | Freq: Four times a day (QID) | ORAL | Status: DC | PRN
  Administered 2018-07-23: 25 mg via ORAL
  Filled 2018-07-23: qty 1

## 2018-07-23 MED ORDER — SODIUM CHLORIDE 0.9 % IV SOLN
510.0000 mg | Freq: Once | INTRAVENOUS | Status: AC
  Administered 2018-07-23: 510 mg via INTRAVENOUS
  Filled 2018-07-23: qty 17

## 2018-07-23 MED ORDER — SODIUM CHLORIDE 0.9% FLUSH
10.0000 mL | INTRAVENOUS | Status: DC | PRN
  Administered 2018-07-24: 10 mL
  Filled 2018-07-23: qty 40

## 2018-07-23 MED ORDER — CYANOCOBALAMIN 1000 MCG/ML IJ SOLN
1000.0000 ug | Freq: Once | INTRAMUSCULAR | Status: AC
  Administered 2018-07-23: 1000 ug via INTRAMUSCULAR
  Filled 2018-07-23 (×2): qty 1

## 2018-07-23 MED ORDER — SODIUM CHLORIDE 0.9% FLUSH
10.0000 mL | Freq: Two times a day (BID) | INTRAVENOUS | Status: DC
  Administered 2018-07-23: 10 mL

## 2018-07-23 NOTE — Progress Notes (Addendum)
[facility] Progress Note  CC:  Nausea and vomiting. + C.diff   Subjective: No Nausea or vomiting. She is hungry. She is tolerating a full diet, to advance to soft diet now. No further diarrhea. No BM yet today.  Objective:  Vital signs in last 24 hours: Temp:  [98.1 F (36.7 C)-98.3 F (36.8 C)] 98.1 F (36.7 C) (03/25 0556) Pulse Rate:  [61-69] 63 (03/25 0556) Resp:  [19-20] 20 (03/25 0556) BP: (124-129)/(66-76) 124/66 (03/25 0556) SpO2:  [97 %-99 %] 97 % (03/25 0556) Weight:  [87.8 kg] 87.8 kg (03/25 0553) Last BM Date: 07/21/18 General:   Alert,  Well-developed,    in NAD Heart:  Regular rate and rhythm; no murmurs Pulm: Lungs clear. Abdomen:  Soft, nontender and nondistended. Normal bowel sounds, without guarding, and without rebound.   Extremities:  Without edema. Neurologic:  Alert and  oriented x4;  grossly normal neurologically. Psych:  Alert and cooperative. Normal mood and affect.  Intake/Output from previous day: 03/24 0701 - 03/25 0700 In: 1810.2 [P.O.:240; I.V.:1570.2] Out: 0  Intake/Output this shift: Total I/O In: 157.8 [I.V.:157.8] Out: -   Lab Results: Recent Labs    07/21/18 0437 07/22/18 0451 07/23/18 0403  WBC 6.8 7.2 5.7  HGB 9.9* 9.5* 9.5*  HCT 33.4* 32.1* 32.2*  PLT 251 233 215   BMET Recent Labs    07/21/18 1239  NA 137  K 4.4  CL 106  CO2 22  GLUCOSE 119*  BUN 7*  CREATININE 0.99  CALCIUM 8.2*    Assessment / Plan:  1. 85 y.o. female with nausea and vomiting. EGD 3/17 showed  a benign 10 mm ulcer at GE junction.Esophagogram 06/28/2018 neg. Abdominal/Pelvic CT 3/23 showed gallstones, no cholecystitis, and colon wall thickening c/w acute colitis.-continue -Protonix 40mg  po reduced to once daily -Zofran 4mg  po Q 6hrs PRN -advance to soft  Diet -GI follow up in office in 3 to 4 weeks, will need repeat CBC and iron studies prior to follow up appointment.  2. C.diff colitis  C.diff antigen/toxin + started on  po Vanco 125mg  qid -continue Vancomycin 125mg  po qid x 14 days -continue Florastor 1 capsule po bid x 4 weeks   3. Anemia Hg 9.5 << 9.9. HCT 32.1 << 33.4. Iron 25. TIBC 176. Vitamin B12 285. -follow H/H daily -monitor for GI bleeding -Feraheme IV x 1 -Vitamin B12 1,000 mcg IM injection x 1    Ok to discharge patient home later today if she tolerates a soft diet for lunch     LOS: 8 days   Noralyn Pick  07/23/2018, 11:11 AM   Attending physician's note   I have taken an interval history, reviewed the chart and examined the patient. I agree with the Advanced Practitioner's note, impression and recommendations.   85 year old with HTN, AV block s/p pacemaker, CKD3, H/O CVA adm d/t N/V. EGD 3/17 showed GE junction ulcer (neg Bx), neg Ba swallow, MBS, speech evaluation, CT head and CT abdo/pelvis for any etiology for N/V.  CT did show colitis, had positive C. Difficile.  Started on vancomycin with resolution of diarrhea.  However continued nausea/vomiting Plan:  -Vanc 125mg  QID x 14 days total. -have reduced protonix to 40 QD.  -UGI with SB series in AM, if neg solid phase GES, -Trial of Phenergan  -repeat EGD in 12 weeks.  Recommend colonoscopy at the same time.   -FU with urology for incidental 2.5 cm R renal lesion (highly suspicious for  RCC).   Carmell Austria, MD

## 2018-07-23 NOTE — Progress Notes (Signed)
PROGRESS NOTE    [name]  TDD:[id number] DOB: 1933-08-17 DOA: 07/13/2018 PCP: Randel Books, FNP    Brief Narrative:  85 y.o. female with a history of complete heart block s/p pacemaker, hyperlipidemia, CKD stage III, stroke. She presented secondary to recurrent/intractable nausea and vomiting.  Assessment & Plan:   Active Problems:   HYPERTENSION, HEART CONTROLLED W/O ASSOC CHF   AV BLOCK, COMPLETE   Pacemaker-[employer]   Daytime somnolence   Essential hypertension   Hyperlipidemia   AKI (acute kidney injury) (HCC)   Loss of weight   Hypokalemia   Low blood magnesium   Vomiting   Cough   Ileus (HCC)   Malnutrition of moderate degree   Enteritis due to Clostridium difficile  C. Diff colitis Intractable nausea/vomiting S/p EGD on 3/17 significant for 2 gastric ulcers. History of strokes. CT head negative for acute stroke. Was started on Reglan without much improvement. Developed loose stools while on Reglan. Afebrile with normal WBC. No abdominal pain. C. Difficile test noted to be positive -Encouraging continue attempts at oral intake -Continue IV fluids -GI recommendations: Vancomycin PO for C. difficile infection -Did not tolerate soft diet today. Cont to encourage PO  Right kidney mass 2.5 cm. Incidental. Concern for possible renal cell carcinoma. Discussed with patient. Will need outpatient urology follow-up when discharged  Complete heart block S/p pacemaker. Stable at this time.  History of stroke -Continue aspirin and Plavix -Currently stable  Hyperlipidemia -Continue Zocor -Stable at this time  Hypokalemia/hypomagnesemia -Secondary to vomiting. Supplementation given. -Currently stable  Fever -Possibly secondary to C. Difficile. -Afebrile  Anemia Acute drop possibly secondary to colitis. No hematochezia or melena. Anemia panel suggestive of chronic disease in setting of chronic kidney disease. Hemodynamically stable at this  time  CKD stage III Stable at this time -Labs reviewed  DVT prophylaxis: Lovenox subq Code Status: Full Family Communication: Pt in room, family not at bedside Disposition Plan: Possible home with home health when able to tolerate soft diet  Consultants:   GI  Procedures:     Antimicrobials: Anti-infectives (From admission, onward)   Start     Dose/Rate Route Frequency Ordered Stop   07/21/18 1800  vancomycin (VANCOCIN) 50 mg/mL oral solution 125 mg     125 mg Oral 4 times daily 07/21/18 1707 07/31/18 1759       Subjective: Feeling nauseated after soft diet lunch.   Objective: Vitals:   07/22/18 2008 07/23/18 0553 07/23/18 0556 07/23/18 1322  BP: 129/75  124/66 (!) 154/80  Pulse: 69  63 86  Resp: 20  20   Temp: 98.3 F (36.8 C)  98.1 F (36.7 C) 98.1 F (36.7 C)  TempSrc: Oral  Oral Oral  SpO2: 99%  97% 100%  Weight:  87.8 kg    Height:        Intake/Output Summary (Last 24 hours) at 07/23/2018 1500 Last data filed at 07/23/2018 1000 Gross per 24 hour  Intake 1728.02 ml  Output 0 ml  Net 1728.02 ml   Filed Weights   07/20/18 0516 07/22/18 0351 07/23/18 0553  Weight: 89.2 kg 81.3 kg 87.8 kg    Examination:  General exam: Appears calm and comfortable  Respiratory system: Clear to auscultation. Respiratory effort normal. Cardiovascular system: S1 & S2 heard, RRR Gastrointestinal system: Abdomen is nondistended, soft and nontender. No organomegaly or masses felt. Normal bowel sounds heard. Central nervous system: Alert and oriented. No focal neurological deficits. Extremities: Symmetric 5 x 5 power. Skin: No  rashes, lesions  Psychiatry: Judgement and insight appear normal. Mood & affect appropriate.   Data Reviewed: I have personally reviewed following labs and imaging studies  CBC: Recent Labs  Lab 07/19/18 0337 07/20/18 0458 07/21/18 0437 07/22/18 0451 07/23/18 0403  WBC 6.9 5.1 6.8 7.2 5.7  HGB 9.1* 9.7* 9.9* 9.5* 9.5*  HCT 30.7* 32.1*  33.4* 32.1* 32.2*  MCV 90.8 90.4 90.3 90.7 91.0  PLT 221 205 251 233 253   Basic Metabolic Panel: Recent Labs  Lab 07/18/18 0337 07/21/18 1239  NA 144 137  K 4.1 4.4  CL 118* 106  CO2 21* 22  GLUCOSE 102* 119*  BUN 8 7*  CREATININE 0.91 0.99  CALCIUM 8.1* 8.2*   GFR: Estimated Creatinine Clearance: 45.1 mL/min (by C-G formula based on SCr of 0.99 mg/dL). Liver Function Tests: No results for input(s): AST, ALT, ALKPHOS, BILITOT, PROT, ALBUMIN in the last 168 hours. No results for input(s): LIPASE, AMYLASE in the last 168 hours. No results for input(s): AMMONIA in the last 168 hours. Coagulation Profile: No results for input(s): INR, PROTIME in the last 168 hours. Cardiac Enzymes: No results for input(s): CKTOTAL, CKMB, CKMBINDEX, TROPONINI in the last 168 hours. BNP (last 3 results) No results for input(s): PROBNP in the last 8760 hours. HbA1C: No results for input(s): HGBA1C in the last 72 hours. CBG: No results for input(s): GLUCAP in the last 168 hours. Lipid Profile: No results for input(s): CHOL, HDL, LDLCALC, TRIG, CHOLHDL, LDLDIRECT in the last 72 hours. Thyroid Function Tests: No results for input(s): TSH, T4TOTAL, FREET4, T3FREE, THYROIDAB in the last 72 hours. Anemia Panel: Recent Labs    07/21/18 1239  VITAMINB12 285  FOLATE 10.8  FERRITIN 212  TIBC 176*  IRON 25*  RETICCTPCT 1.3   Sepsis Labs: No results for input(s): PROCALCITON, LATICACIDVEN in the last 168 hours.  Recent Results (from the past 240 hour(s))  Gastrointestinal Panel by PCR , Stool     Status: None   Collection Time: 07/21/18  1:31 PM  Result Value Ref Range Status   Campylobacter species NOT DETECTED NOT DETECTED Final   Plesimonas shigelloides NOT DETECTED NOT DETECTED Final   Salmonella species NOT DETECTED NOT DETECTED Final   Yersinia enterocolitica NOT DETECTED NOT DETECTED Final   Vibrio species NOT DETECTED NOT DETECTED Final   Vibrio cholerae NOT DETECTED NOT DETECTED  Final   Enteroaggregative E coli (EAEC) NOT DETECTED NOT DETECTED Final   Enteropathogenic E coli (EPEC) NOT DETECTED NOT DETECTED Final   Enterotoxigenic E coli (ETEC) NOT DETECTED NOT DETECTED Final   Shiga like toxin producing E coli (STEC) NOT DETECTED NOT DETECTED Final   Shigella/Enteroinvasive E coli (EIEC) NOT DETECTED NOT DETECTED Final   Cryptosporidium NOT DETECTED NOT DETECTED Final   Cyclospora cayetanensis NOT DETECTED NOT DETECTED Final   Entamoeba histolytica NOT DETECTED NOT DETECTED Final   Giardia lamblia NOT DETECTED NOT DETECTED Final   Adenovirus F40/41 NOT DETECTED NOT DETECTED Final   Astrovirus NOT DETECTED NOT DETECTED Final   Norovirus GI/GII NOT DETECTED NOT DETECTED Final   Rotavirus A NOT DETECTED NOT DETECTED Final   Sapovirus (I, II, IV, and V) NOT DETECTED NOT DETECTED Final    Comment: Performed at [facility], [address] [place], [place] [address]  C Difficile Quick Screen w PCR reflex     Status: Abnormal   Collection Time: 07/21/18  1:32 PM  Result Value Ref Range Status   C Diff antigen POSITIVE (  A) NEGATIVE Final   C Diff toxin POSITIVE (A) NEGATIVE Final   C Diff interpretation Toxin producing C. difficile detected.  Final    Comment: CRITICAL RESULT CALLED TO, READ BACK BY AND VERIFIED WITH: S.PICKETT AT 1694 ON 07/21/18 BY N.THOMPSON Performed at [facility], [place] [address], [place], [place] [address]      Radiology Studies: No results found.  Scheduled Meds: . aspirin EC  81 mg Oral Daily  . clopidogrel  75 mg Oral Q breakfast  . cyanocobalamin  1,000 mcg Intramuscular Once  . enoxaparin (LOVENOX) injection  40 mg Subcutaneous Q24H  . feeding supplement  1 Container Oral TID BM  . latanoprost  1 drop Both Eyes QHS  . ondansetron  4 mg Oral Q6H  . [START ON 07/24/2018] pantoprazole  40 mg Oral Daily  . saccharomyces boulardii  250 mg Oral BID  . simvastatin  40 mg Oral QHS  . vancomycin  125 mg Oral  QID   Continuous Infusions: . dextrose 5 % and 0.9 % NaCl with KCl 40 mEq/L Stopped (07/23/18 0817)  . ferumoxytol       LOS: 8 days   Marylu Lund, MD [facility] Pager On Amion  If 7PM-7AM, please contact night-coverage 07/23/2018, 3:00 PM

## 2018-07-23 NOTE — Progress Notes (Signed)
Physical Therapy Treatment Patient Details Name: [name] MRN: [id number] DOB: [birth year] Today's Date: 07/23/2018    History of Present Illness Pt is a 85 y.o. female admitted 3/15 with nausea, vomiting, and weakness. Pt had been at [facility] previously after a fall resulting in rhabdomyolysis and acute on chronic CKD. Pt had been home one day, then returned to ED for this hospitalization. PMH includes pacemaker, HTN, CVAx3 with L weakness per pt report, CHF, HLD.    PT Comments    Pt with improved performance of LE exercises this session, and pt with more endurance for activity today. Pt still requiring multiple verbal cues for form and safety with RW use. Pt was excited about being able to start soft diet today. PT to continue to follow acutely, will progress mobility as tolerated.    Follow Up Recommendations  Supervision for mobility/OOB;SNF     Equipment Recommendations  None recommended by PT    Recommendations for Other Services       Precautions / Restrictions Precautions Precautions: Fall;ICD/Pacemaker Restrictions Weight Bearing Restrictions: No    Mobility  Bed Mobility Overal bed mobility: Needs Assistance             General bed mobility comments: Pt up in chair upon arrival and requesting to stay in chair upon PT exit.   Transfers Overall transfer level: Needs assistance Equipment used: Rolling walker (2 wheeled) Transfers: Sit to/from Stand Sit to Stand: Min guard;Min assist         General transfer comment: min guard to min assist for safety and power up assist as needed.  Ambulation/Gait Ambulation/Gait assistance: Min guard Gait Distance (Feet): 80 Feet(3x20 ft rounds in room, and and from restroom 28ft) Assistive device: Rolling walker (2 wheeled) Gait Pattern/deviations: Step-through pattern;Decreased stride length;Trunk flexed Gait velocity: decr   General Gait Details: Min guard for safety. Frequent verbal cuing for upright  posture, positioning in RW. Pt with increased time to turn with RW, form improved with lifting RW to turn.    Stairs             Wheelchair Mobility    Modified Rankin (Stroke Patients Only)       Balance Overall balance assessment: Needs assistance;History of Falls Sitting-balance support: No upper extremity supported Sitting balance-Leahy Scale: Good     Standing balance support: Bilateral upper extremity supported Standing balance-Leahy Scale: Fair Standing balance comment: able to stand without UE support, relies on RW for dynamic activity                            Cognition Arousal/Alertness: Awake/alert Behavior During Therapy: WFL for tasks assessed/performed Overall Cognitive Status: Within Functional Limits for tasks assessed                                 General Comments: Pt very pleasant and funny with PT. Pt excited because her diet was updated to soft diet.       Exercises General Exercises - Lower Extremity Ankle Circles/Pumps: AROM;Both;10 reps;Seated Long Arc Quad: AROM;Both;Seated;15 reps Hip ABduction/ADduction: Both;Seated;15 reps;AAROM Straight Leg Raises: AAROM;Both;10 reps;Seated Hip Flexion/Marching: AROM;AAROM;Both;10 reps;Seated Mini-Sqauts: AROM;Both;10 reps;Seated;Standing(x10 without rest, focusing on hip extension once standing)    General Comments        Pertinent Vitals/Pain Pain Assessment: No/denies pain Pain Intervention(s): Monitored during session;Limited activity within patient's tolerance    Home Living  Prior Function            PT Goals (current goals can now be found in the care plan section) Acute Rehab PT Goals Patient Stated Goal: be independent as long as possible PT Goal Formulation: With patient Time For Goal Achievement: 07/29/18 Potential to Achieve Goals: Good Progress towards PT goals: Progressing toward goals    Frequency    Min  3X/week      PT Plan Current plan remains appropriate    Co-evaluation              AM-PAC PT "6 Clicks" Mobility   Outcome Measure  Help needed turning from your back to your side while in a flat bed without using bedrails?: A Little Help needed moving from lying on your back to sitting on the side of a flat bed without using bedrails?: A Lot Help needed moving to and from a bed to a chair (including a wheelchair)?: A Little Help needed standing up from a chair using your arms (e.g., wheelchair or bedside chair)?: A Little Help needed to walk in hospital room?: A Little Help needed climbing 3-5 steps with a railing? : A Lot 6 Click Score: 16    End of Session Equipment Utilized During Treatment: Gait belt Activity Tolerance: Patient tolerated treatment well;Patient limited by fatigue Patient left: in chair;with call bell/phone within reach;with chair alarm set Nurse Communication: Mobility status PT Visit Diagnosis: Other abnormalities of gait and mobility (R26.89);Difficulty in walking, not elsewhere classified (R26.2);History of falling (Z91.81)     Time: 0034-9611 PT Time Calculation (min) (ACUTE ONLY): 31 min  Charges:  $Gait Training: 8-22 mins $Therapeutic Exercise: 8-22 mins                     Julien Girt, PT Acute Rehabilitation Services Pager [phone]  Office [phone]    Roxine Caddy D Elonda Husky 07/23/2018, 12:48 PM

## 2018-07-24 ENCOUNTER — Inpatient Hospital Stay (HOSPITAL_COMMUNITY): Payer: Medicare Other

## 2018-07-24 LAB — COMPREHENSIVE METABOLIC PANEL
ALBUMIN: 1.9 g/dL — AB (ref 3.5–5.0)
ALT: 9 U/L (ref 0–44)
AST: 14 U/L — ABNORMAL LOW (ref 15–41)
Alkaline Phosphatase: 52 U/L (ref 38–126)
Anion gap: 7 (ref 5–15)
BUN: 6 mg/dL — ABNORMAL LOW (ref 8–23)
CO2: 20 mmol/L — ABNORMAL LOW (ref 22–32)
Calcium: 7.6 mg/dL — ABNORMAL LOW (ref 8.9–10.3)
Chloride: 109 mmol/L (ref 98–111)
Creatinine, Ser: 0.82 mg/dL (ref 0.44–1.00)
GFR calc Af Amer: >60 mL/min (ref >60)
GFR calc non Af Amer: >60 mL/min (ref >60)
GLUCOSE: 91 mg/dL (ref 70–99)
Potassium: 3.7 mmol/L (ref 3.5–5.1)
SODIUM: 136 mmol/L (ref 135–145)
Total Bilirubin: 0.7 mg/dL (ref 0.3–1.2)
Total Protein: 4.9 g/dL — ABNORMAL LOW (ref 6.5–8.1)

## 2018-07-24 LAB — CBC
HCT: 31.4 % — ABNORMAL LOW (ref 36.0–46.0)
Hemoglobin: 9.3 g/dL — ABNORMAL LOW (ref 12.0–15.0)
MCH: 27 pg (ref 26.0–34.0)
MCHC: 29.6 g/dL — ABNORMAL LOW (ref 30.0–36.0)
MCV: 91 fL (ref 80.0–100.0)
Platelets: 235 10*3/uL (ref 150–400)
RBC: 3.45 MIL/uL — ABNORMAL LOW (ref 3.87–5.11)
RDW: 16.1 % — ABNORMAL HIGH (ref 11.5–15.5)
WBC: 4.5 10*3/uL (ref 4.0–10.5)
nRBC: 0 % (ref 0.0–0.2)

## 2018-07-24 MED ORDER — PANTOPRAZOLE SODIUM 40 MG PO TBEC: 40.0000 mg | DELAYED_RELEASE_TABLET | Freq: Every day | ORAL | 0 refills | Status: DC

## 2018-07-24 MED ORDER — ENSURE ENLIVE PO LIQD: 237.0000 mL | ORAL | Status: DC

## 2018-07-24 MED ORDER — ADULT MULTIVITAMIN W/MINERALS CH
1.0000 | ORAL_TABLET | Freq: Every day | ORAL | Status: DC
  Administered 2018-07-24: 1 via ORAL
  Filled 2018-07-24: qty 1

## 2018-07-24 MED ORDER — SACCHAROMYCES BOULARDII 250 MG PO CAPS: 250.0000 mg | ORAL_CAPSULE | Freq: Two times a day (BID) | ORAL | 0 refills | Status: AC

## 2018-07-24 MED ORDER — VANCOMYCIN 50 MG/ML ORAL SOLUTION: 125.0000 mg | Freq: Four times a day (QID) | ORAL | 0 refills | Status: AC

## 2018-07-24 NOTE — Progress Notes (Addendum)
Nutrition Follow-up  RD working remotely.   DOCUMENTATION CODES:   Obesity unspecified, Non-severe (moderate) malnutrition in context of acute illness/injury  INTERVENTION:  - Will d/c Boost Breeze as patient does not like this supplement and refuses to drink it. - Will trial Magic Cup BID with meals, each supplement provides 290 kcal and 9 grams of protein. - Will order daily multivitamin with minerals. - Continue to encourage PO intakes.    NUTRITION DIAGNOSIS:   Moderate Malnutrition related to acute illness(intractable nausea/vomiting) as evidenced by percent weight loss, energy intake < 75% for > 7 days, mild muscle depletion. -ongoing  GOAL:   Patient will meet greater than or equal to 90% of their needs -unmet  MONITOR:   PO intake, Supplement acceptance, Weight trends, Labs  ASSESSMENT:   Patient with PMH significant for HTN, HLD, CVA, CKD III, and complete heart block s/p pacemaker. Presents this admission with intractable nausea and vomiting.   Current weight consistent with admission weight. Diet was advanced from CLD to [place] on 3/23 at 5:00 PM and from FLD to Soft on 3/25 at 10:30 AM. Per review of RN flow sheet, patient consumed 25% of breakfast and lunch on 3/24; no intakes documented from yesterday.   Boost Breeze is ordered TID but patient does not like this supplement and has been refusing it. She was ordered Ensure earlier in admission and also refused that supplement. Will trial Magic Cup with lunch and dinner meals.   Per review of RD notes from 3/16 and 3/20, patient was eating mainly 25% of meals PTA. She was also having ongoing poor appetite, N/V on those dates. Last documented episode of vomiting was on 3/24 at 9:30 AM (yellow emesis).  Per Dr. Rhetta Mura note yesterday afternoon: C.diff positive, did not tolerate soft diet on 3/25, R kidney mass with concern for possible renal cell carcinoma, hypokalemia and hypomagnesemia 2/2 vomiting s/p repletion and now  stable. Note indicates possibly home with Home Health when she is able to tolerate soft diet.     Medications reviewed; 1000 mcg intramuscular vitamin B12 x1 dose 3/25, 510 mg feraheme x1 dose 3/25. Labs reviewed; BUN: 6 mg/dl, Ca: 7.6 mg/dl. IVF; D5-NS-40 mEq KCl @ 75 ml/hr (306 kcal).      Diet Order:   Diet Order            DIET SOFT Room service appropriate? Yes; Fluid consistency: Thin  Diet effective now              EDUCATION NEEDS:   Education needs have been addressed  Skin:  Skin Assessment: Reviewed RN Assessment  Last BM:  3/25  Height:   Ht Readings from Last 1 Encounters:  07/14/18 5\' 2"  (1.575 m)    Weight:   Wt Readings from Last 1 Encounters:  07/23/18 87.8 kg    Ideal Body Weight:  50 kg  BMI:  Body mass index is 35.4 kg/m.  Estimated Nutritional Needs:   Kcal:  1500-1700 kcal  Protein:  75-90 grams  Fluid:  >/= 1.5 L/day     Jarome Matin, MS, RD, LDN, [facility] Inpatient Clinical Dietitian Pager # [phone] After hours/weekend pager # [phone]

## 2018-07-24 NOTE — Progress Notes (Signed)
     [facility] Progress Note  CC:  Nausea/Vomiting, C. Diff colitis   Subjective: She did not tolerate solid food at lunch yesterday, ate meatloaf and potatoes which resulted in nausea and spitting up saliva. No vomiting. She tolerated spaghetti for dinner without difficulty. She passed 2 soft BMs this morning, no watery diarrhea.   Objective:  Vital signs in last 24 hours: Temp:  [98.1 F (36.7 C)] 98.1 F (36.7 C) (03/25 2010) Pulse Rate:  [65-86] 65 (03/25 2010) Resp:  [18] 18 (03/25 2010) BP: (139-154)/(67-80) 139/67 (03/25 2010) SpO2:  [99 %-100 %] 99 % (03/25 2010) Last BM Date: 07/23/18 General:   Alert,  Well-developed,    in NAD Heart:  Regular rate and rhythm; no murmurs Pulm: Clear throughout Abdomen:  Soft, nontender and nondistended. Normal bowel sounds, without guarding, and without rebound.  Ecchymotic patches lower abdomen.  Extremities:  Without edema. Neurologic:  Alert and  oriented x4;  grossly normal neurologically. Psych:  Alert and cooperative. Normal mood and affect.  Intake/Output from previous day: 03/25 0701 - 03/26 0700 In: 1551.7 [P.O.:240; I.V.:1194.7; IV Piggyback:117] Out: 0  Intake/Output this shift: No intake/output data recorded.  Lab Results: Recent Labs    07/22/18 0451 07/23/18 0403 07/24/18 0355  WBC 7.2 5.7 4.5  HGB 9.5* 9.5* 9.3*  HCT 32.1* 32.2* 31.4*  PLT 233 215 235   BMET Recent Labs    07/21/18 1239 07/24/18 0355  NA 137 136  K 4.4 3.7  CL 106 109  CO2 22 20*  GLUCOSE 119* 91  BUN 7* 6*  CREATININE 0.99 0.82  CALCIUM 8.2* 7.6*   LFT Recent Labs    07/24/18 0355  PROT 4.9*  ALBUMIN 1.9*  AST 14*  ALT 9  ALKPHOS 52  BILITOT 0.7   PT/INR No results for input(s): LABPROT, INR in the last 72 hours. Hepatitis Panel No results for input(s): HEPBSAG, HCVAB, HEPAIGM, HEPBIGM in the last 72 hours.  No results found.  Assessment / Plan:  1. 85 y.o. female with nausea and vomiting.  EGD3/17 showeda benign10 mm ulcer at GE junction.Esophagogram 06/28/2018 neg.Abdominal/Pelvic CT 3/23 showed gallstones, no cholecystitis, and colon wall thickening c/w acute colitis. A 2.5cm right kidney mass suspicious for renal cell carcinoma -solid phase gastric empty study today -continue -Protonix 40mg  po reduced to once daily -Zofran 4mg  po Q 6hrs PRN  2. C.diff colitis  C.diff antigen/toxin + started on po Vanco 125mg  qid -continue Vancomycin 125mg  po qid x 14 days -continue Florastor 1 capsule po bid x 4 weeks  3. Anemia Hg 9.3 << 9.9. HCT 31.4 << 33.4. Iron 25. TIBC 176. Vitamin B12 285. Received B12 and IV iron on 3/25. -follow H/H daily -monitor for GI bleeding   4. Right renal mass. A/P CT 3/23 showed a 2.5cm right kidney mass suspicious for renal cell carcinoma -Dr. Wyline Copas has addressed right renal mass with patient, to have urology evaluation as outpatient. I spoke to patient's granddaughter Janett Billow, regarding need for urology consult.     LOS: 9 days   [name]  07/24/2018, 9:52 AM

## 2018-07-24 NOTE — Discharge Summary (Addendum)
Physician Discharge Summary  ESTY AHUJA NWG:[id number] DOB: 09-20-1933 DOA: 07/13/2018  PCP: Randel Books, FNP  Admit date: 07/13/2018 Discharge date: 07/29/2018  Admitted From: Home Disposition:  Home  Recommendations for Outpatient Follow-up:  1. Follow up with PCP in 2-3 weeks 2. Follow up with [facility] as scheduled 3. Follow up with Urology  Home Health:PT, RN, Aide   Discharge Condition:Improved CODE STATUS:Full Diet recommendation: Soft, advance as tolerated   Brief/Interim Summary: 85 y.o.female with a history of complete heart block s/p pacemaker, hyperlipidemia, CKD stage III, stroke. She presented secondary to recurrent/intractable nausea and vomiting.  Discharge Diagnoses:  Principal Problem:   Enteritis due to Clostridium difficile Active Problems:   HYPERTENSION, HEART CONTROLLED W/O ASSOC CHF   AV BLOCK, COMPLETE   Pacemaker-Medtronic   Daytime somnolence   Essential hypertension   Hyperlipidemia   AKI (acute kidney injury) (HCC)   Loss of weight   Hypokalemia   Low blood magnesium   Vomiting   Cough   Ileus (HCC)   Malnutrition of moderate degree  C. Diff colitis Intractable nausea/vomiting S/p EGD on 3/17 significant for 2 gastric ulcers. History of strokes. CT head negative for acute stroke.Was started on Reglan without much improvement. Developed loose stools while on Reglan. Afebrile with normal WBC. No abdominal pain. C. Difficile test noted to be positive -GI recommendations: Vancomycin PO for C. difficile infection, total 14 days of tx -Did tolerate soft diet. Question of nausea with food, thus pt underwent gastric emptying study which was found to be normal -Stools noted to be more formed. -Discussed with GI, recommendation for d/c with close outpt f/u to complete 14 days of PO vanc and daily PPI for above mentioned ulcer disease  Right kidney mass 2.5 cm. Incidental. Concern for possible renal cell carcinoma. Discussed with  patient. Will need outpatient urology follow-up when discharged  Complete heart block S/p pacemaker. Stable at this time.  History of stroke -Continue aspirin and Plavix -Currently stable  Hyperlipidemia -Continue Zocor -Stable at this time  Hypokalemia/hypomagnesemia -Secondary to vomiting. Supplementation given. -Currently stable  Fever -Possibly secondary to C. Difficile. -Afebrile  Anemia Acutedrop possibly secondary to colitis. No hematochezia or melena. Anemia panel suggestive of chronic disease in setting of chronic kidney disease. Hemodynamically stable at this time  CKD stage III Stable at this time -Labs reviewed   Discharge Instructions   Allergies as of 07/24/2018   No Known Allergies     Medication List    STOP taking these medications   albuterol (2.5 MG/3ML) 0.083% nebulizer solution Commonly known as:  PROVENTIL   guaiFENesin 600 MG 12 hr tablet Commonly known as:  MUCINEX   ipratropium-albuterol 0.5-2.5 (3) MG/3ML Soln Commonly known as:  DUONEB     TAKE these medications   acetaminophen 500 MG tablet Commonly known as:  TYLENOL Take 500-1,000 mg by mouth every 6 (six) hours as needed for moderate pain.   aspirin 81 MG tablet Take 81 mg by mouth daily.   Blood Pressure Kit Automated blood pressure measuring device.   clopidogrel 75 MG tablet Commonly known as:  PLAVIX Take 75 mg by mouth daily with breakfast.   Lumigan 0.01 % Soln Generic drug:  bimatoprost Place 1 drop into both eyes at bedtime.   ondansetron 4 MG tablet Commonly known as:  ZOFRAN Take 4 mg by mouth every 8 (eight) hours as needed for nausea or vomiting.   pantoprazole 40 MG tablet Commonly known as:  PROTONIX Take 1  tablet (40 mg total) by mouth daily for 30 days.   saccharomyces boulardii 250 MG capsule Commonly known as:  FLORASTOR Take 1 capsule (250 mg total) by mouth 2 (two) times daily for 30 days.   simvastatin 40 MG tablet Commonly  known as:  ZOCOR Take 1 tablet (40 mg total) by mouth at bedtime.   vancomycin 50 mg/mL  oral solution Commonly known as:  VANCOCIN Take 2.5 mLs (125 mg total) by mouth 4 (four) times daily for 11 days.      Follow-up Information    Randel Books, FNP. Schedule an appointment as soon as possible for a visit in 3 week(s).   Specialty:  Family Medicine Contact information: [place] [address] [phone]        [facility] Follow up.   Specialty:  Gastroenterology Why:  Follow up as scheduled Contact information: [place] [address] [place]. Schedule an appointment as soon as possible for a visit.   Contact information: [place] [phone]         No Known Allergies  Consultations:  GI  Procedures/Studies: Dg Chest 2 View  Result Date: 07/13/2018 CLINICAL DATA:  Decreased appetite and vomiting. Cough. Hypertension. EXAM: CHEST - 2 VIEW COMPARISON:  June 27, 2018 FINDINGS: There is no appreciable edema or consolidation. Heart is mildly enlarged with pulmonary vascularity normal. Pacemaker leads are attached to the right atrium and right ventricle. No adenopathy. There is degenerative change in the thoracic spine. There is anterior wedging of a midthoracic vertebral body, stable. IMPRESSION: Mild cardiomegaly. No edema or consolidation. Pacemaker leads attached to right atrium and right ventricle. Electronically Signed   By: Lowella Grip III M.D.   On: 07/13/2018 15:47   Ct Head Wo Contrast  Result Date: 07/15/2018 CLINICAL DATA:  Onset difficulty swallowing today. EXAM: CT HEAD WITHOUT CONTRAST TECHNIQUE: Contiguous axial images were obtained from the base of the skull through the vertex without intravenous contrast. COMPARISON:  Head CT scan 05/26/2018 and 05/27/2018. FINDINGS: Brain: No evidence of  acute infarction, hemorrhage, hydrocephalus, extra-axial collection or mass lesion/mass effect. Atrophy, chronic microvascular ischemic change remote basal ganglia lacunar infarctions on the right noted. Small left cerebellar infarct is also again seen. Vascular: No hyperdense vessel or unexpected calcification. Skull: Intact. Sinuses/Orbits: Status post cataract surgery.  Otherwise negative. Other: None. IMPRESSION: No acute abnormality. No change in atrophy, chronic microvascular ischemic disease small infarcts. Electronically Signed   By: Inge Rise M.D.   On: 07/15/2018 16:52   Ct Abdomen Pelvis W Contrast  Result Date: 07/20/2018 CLINICAL DATA:  Persistent nausea and vomiting. Loss of appetite. EXAM: CT ABDOMEN AND PELVIS WITH CONTRAST TECHNIQUE: Multidetector CT imaging of the abdomen and pelvis was performed using the standard protocol following bolus administration of intravenous contrast. CONTRAST:  57m OMNIPAQUE IOHEXOL 300 MG/ML SOLN, 1039mOMNIPAQUE IOHEXOL 300 MG/ML SOLN COMPARISON:  None. FINDINGS: Lower chest: No acute abnormality. Hepatobiliary: Cholelithiasis without evidence of acute cholecystitis. No focal liver abnormality. No bile duct dilatation seen. Pancreas: Unremarkable. No pancreatic ductal dilatation or surrounding inflammatory changes. Spleen: Normal in size without focal abnormality. Adrenals/Urinary Tract: Bilateral renal cysts. Mass within the lower pole of the RIGHT kidney measures 2.5 cm, with possible enhancing component. No hydronephrosis. No ureteral or bladder calculi identified. Bladder is decompressed. Stomach/Bowel: Thickening of the walls of the majority of the colon,  most prominent/definitive within the descending and sigmoid colon, probable additional thickening of the ascending colon/cecum. Associated pericolonic inflammation/fluid stranding, again most prominent along the walls of the descending and sigmoid colon. No dilated large or small bowel loops. Stomach  is unremarkable, partially decompressed. Vascular/Lymphatic: Aortic atherosclerosis. No acute appearing vascular abnormality. No enlarged lymph nodes seen. Reproductive: Uterus and bilateral adnexa are unremarkable. Other: Small amount of free fluid in the lower pelvis. No abscess collection seen. No free intraperitoneal air. Musculoskeletal: Degenerative changes throughout the slightly scoliotic thoracolumbar spine, mild to moderate in degree. No acute or suspicious osseous finding. IMPRESSION: 1. Thickening of the walls of the majority of the colon, most prominent/definitive within the descending and sigmoid colon, with associated pericolonic inflammation/fluid stranding. Findings are consistent with colitis of infectious or inflammatory nature. 2. **An incidental finding of potential clinical significance has been found. 2.5 cm mass within the lower pole of the right kidney, suspicious for renal cell carcinoma. Recommend further characterization with nonemergent renal MRI.** 3. Cholelithiasis without evidence of acute cholecystitis. Aortic Atherosclerosis (ICD10-I70.0). Electronically Signed   By: Franki Cabot M.D.   On: 07/20/2018 13:14   Dg Abd 2 Views  Result Date: 07/14/2018 CLINICAL DATA:  Nausea and vomiting. EXAM: ABDOMEN - 2 VIEW COMPARISON:  06/27/2018 FINDINGS: There is no bowel dilation to suggest obstruction. There are air-fluid levels in nondistended bowel on the erect view. No free air. No evidence of renal or ureteral stones. Stable phleboliths noted in the pelvis. There are scattered aortic and iliac artery vascular calcifications. No acute skeletal abnormality.  Lung bases are clear. IMPRESSION: 1. No evidence of bowel obstruction or free air. 2. Air-fluid levels within nondistended bowel. This is nonspecific but consistent with a low-grade adynamic ileus or gastroenteritis. Electronically Signed   By: Lajean Manes M.D.   On: 07/14/2018 01:36   Dg Swallowing Func-speech  Pathology  Result Date: 07/16/2018 Objective Swallowing Evaluation: Type of Study: MBS-Modified Barium Swallow Study  Patient Details Name: RISHIKA MCCOLLOM MRN: [id number] Date of Birth: [birth year] Today's Date: 07/16/2018 Time: SLP Start Time (ACUTE ONLY): 1500 -SLP Stop Time (ACUTE ONLY): 1525 SLP Time Calculation (min) (ACUTE ONLY): 25 min Past Medical History: Past Medical History: Diagnosis Date . Complete heart block ([place])  . Gait abnormality 07/23/2016 . History of hypertension  . History of stroke  . Hyperhomocysteinemia ([place])  . Hyperlipidemia  . Hypertension  . Hypokalemia  . Hypokalemia 07/13/2018 . Low blood magnesium 07/13/2018 . Pacemaker-Medtronic 08/07/2011 . Vomiting 07/13/2018 Past Surgical History: Past Surgical History: Procedure Laterality Date . BIOPSY  07/15/2018  Procedure: BIOPSY;  Surgeon: [name], MD;  Location: [facility];  Service: Gastroenterology;; . CARDIAC CATHETERIZATION   . ESOPHAGOGASTRODUODENOSCOPY Left 07/15/2018  Procedure: ESOPHAGOGASTRODUODENOSCOPY (EGD);  Surgeon: [name], MD;  Location: [facility];  Service: Gastroenterology;  Laterality: Left; . PACEMAKER INSERTION    Medtronic Enpulse dual-chamber pacemaker . PERMANENT PACEMAKER GENERATOR CHANGE N/A 07/30/2013  Procedure: PERMANENT PACEMAKER GENERATOR CHANGE;  Surgeon: [name], MD;  Location: [facility] CATH LAB;  Service: Cardiovascular;  Laterality: N/A; HPI: 85 yo female adm to [facility] with nausea/vomiting with intake, FTT.  Pt recently admitted to hospital after found down at home for 2 days - diagnosed with rhabdomylosis and required SNF stay.  She had been at [facility] for Rehab and was dc'd home.  Pt resides alone and reports dysphagia since rhabdomylosis episode.    She reports 30 pound weight loss since last hospital admission.  Pt has undergone esophagram  06/28/2018 showing minimal distal esophageal spasm, no stricture nor mass and small hiatal hernia.  Today EGD completed that showed non bleeding ulcer,  tortuous esophagus and small hiatal hernia.  Pt has prior CVAs that caused her left sided weakness but did not cause swallowing deficits.  Pt denies problems swallowing, stating she can swallow fine but then feels nauseated and it "turns around" and comes back up. Pt underwent BSE yesterday and no indication of oropharyngeal dysphagia noted but GI MD desired to rule out definitively oropharyngeal dysphagia via MBS.   Subjective: pt awake in chair Assessment / Plan / Recommendation CHL IP CLINICAL IMPRESSIONS 07/16/2018 Clinical Impression Pt presented with minimal delay in transiting pudding (5 seconds with lingual rocking) and with oral retention of barium tablet with liquid *pt needed 2nd bolus of thin to transit tablet.  Suspect pt's oral transit delays with solids may be pt conditioned response to her complaint of nausea followed by gagging when swallowing.  She piecemealed puree/solids boluses which was functional for her.  Pharyngeal swallow is timely and strong with NO aspiration or laryngeal penetration.  No oropharyngeal deficits present to account for pt's complaint of nausea followed by gagging and coughing.  Please note, pt did state she was nauseated after MBS - followed by belch and minimal expectoration of thin secretions/barium (less than 1/4 tsp).  This was not replicated further and pt stated "I think I'm ok".  Xray turned back on after pt's event and no barium observed in pharynx.  SLP will follow up x1 to encourage pt and educate her re: her functional oropharyngeal swallow.   SLP Visit Diagnosis Dysphagia, unspecified (R13.10) Attention and concentration deficit following -- Frontal lobe and executive function deficit following -- Impact on safety and function No limitations   CHL IP TREATMENT RECOMMENDATION 07/16/2018 Treatment Recommendations No treatment recommended at this time   Prognosis 07/16/2018 Prognosis for Safe Diet Advancement Good Barriers to Reach Goals -- Barriers/Prognosis Comment  -- CHL IP DIET RECOMMENDATION 07/16/2018 SLP Diet Recommendations Other (Comment) Liquid Administration via Cup;Straw Medication Administration Whole meds with liquid Compensations Minimize environmental distractions;Slow rate;Small sips/bites Postural Changes Seated upright at 90 degrees;Remain semi-upright after after feeds/meals (Comment)   CHL IP OTHER RECOMMENDATIONS 07/16/2018 Recommended Consults -- Oral Care Recommendations Oral care BID Other Recommendations --   CHL IP FOLLOW UP RECOMMENDATIONS 07/16/2018 Follow up Recommendations None   CHL IP FREQUENCY AND DURATION 07/16/2018 Speech Therapy Frequency (ACUTE ONLY) min 1 x/week Treatment Duration 1 week      CHL IP ORAL PHASE 07/16/2018 Oral Phase Impaired Oral - Pudding Teaspoon -- Oral - Pudding Cup -- Oral - Honey Teaspoon -- Oral - Honey Cup -- Oral - Nectar Teaspoon WFL Oral - Nectar Cup WFL Oral - Nectar Straw -- Oral - Thin Teaspoon WFL Oral - Thin Cup WFL Oral - Thin Straw WFL Oral - Puree Piecemeal swallowing;Delayed oral transit Oral - Mech Soft Piecemeal swallowing Oral - Regular -- Oral - Multi-Consistency -- Oral - Pill Delayed oral transit;Weak lingual manipulation;Other (Comment);Decreased bolus cohesion Oral Phase - Comment mild oral transit delays with pudding - when inquired "why" pt states " I guess because I have problems"  CHL IP PHARYNGEAL PHASE 07/16/2018 Pharyngeal Phase WFL Pharyngeal- Pudding Teaspoon -- Pharyngeal -- Pharyngeal- Pudding Cup -- Pharyngeal -- Pharyngeal- Honey Teaspoon -- Pharyngeal -- Pharyngeal- Honey Cup -- Pharyngeal -- Pharyngeal- Nectar Teaspoon WFL Pharyngeal -- Pharyngeal- Nectar Cup WFL Pharyngeal -- Pharyngeal- Nectar Straw -- Pharyngeal -- Pharyngeal- Thin Teaspoon WFL Pharyngeal --  Pharyngeal- Thin Cup [facility] Pharyngeal -- Pharyngeal- Thin Straw WFL Pharyngeal -- Pharyngeal- Puree WFL Pharyngeal -- Pharyngeal- Mechanical Soft WFL Pharyngeal -- Pharyngeal- Regular -- Pharyngeal -- Pharyngeal- Multi-consistency --  Pharyngeal -- Pharyngeal- Pill WFL Pharyngeal -- Pharyngeal Comment --  CHL IP CERVICAL ESOPHAGEAL PHASE 07/16/2018 Cervical Esophageal Phase WFL Pudding Teaspoon -- Pudding Cup -- Honey Teaspoon -- Honey Cup -- Nectar Teaspoon -- Nectar Cup -- Nectar Straw -- Thin Teaspoon -- Thin Cup -- Thin Straw -- Puree -- Mechanical Soft -- Regular -- Multi-consistency -- Pill -- Cervical Esophageal Comment -- Macario Golds 07/16/2018, 6:24 PM  Luanna Salk, MS [facility] SLP Acute Rehab Services Pager [phone] Office [phone]             Nm Gastric Emptying Solid Liquid Both W/sb Transit  Result Date: 07/24/2018 CLINICAL DATA:  Nausea, vomiting and diarrhea since January 2020, hiatal hernia, history hypertension, former smoker EXAM: NUCLEAR MEDICINE GASTRIC EMPTYING SCAN TECHNIQUE: After oral ingestion of approximately 1/2 of the standard radiolabeled meal, sequential abdominal images were obtained at time 0 and at 74 minutes. Percentage of activity emptying the stomach was calculated. RADIOPHARMACEUTICALS:  2.1 mCi Tc-39msulfur colloid in standardized meal COMPARISON:  None. FINDINGS: Expected location of the stomach in the left upper quadrant. Ingested meal empties the stomach gradually over the course of the study. At 74 minutes, 95% of ingested tracer had empty from the stomach. This represents normal gastric emptying. IMPRESSION: Normal gastric emptying. Electronically Signed   By: MLavonia DanaM.D.   On: 07/24/2018 13:24    Subjective: Eager to go home  Discharge Exam: Vitals:   07/23/18 1322 07/23/18 2010  BP: (!) 154/80 139/67  Pulse: 86 65  Resp:  18  Temp: 98.1 F (36.7 C) 98.1 F (36.7 C)  SpO2: 100% 99%   Vitals:   07/23/18 0553 07/23/18 0556 07/23/18 1322 07/23/18 2010  BP:  124/66 (!) 154/80 139/67  Pulse:  63 86 65  Resp:  20  18  Temp:  98.1 F (36.7 C) 98.1 F (36.7 C) 98.1 F (36.7 C)  TempSrc:  Oral Oral Oral  SpO2:  97% 100% 99%  Weight: 87.8 kg     Height:         General: Pt is alert, awake, not in acute distress Cardiovascular: RRR, S1/S2 +, no rubs, no gallops Respiratory: CTA bilaterally, no wheezing, no rhonchi Abdominal: Soft, NT, ND, bowel sounds + Extremities: no edema, no cyanosis   The results of significant diagnostics from this hospitalization (including imaging, microbiology, ancillary and laboratory) are listed below for reference.     Microbiology: Recent Results (from the past 240 hour(s))  Gastrointestinal Panel by PCR , Stool     Status: None   Collection Time: 07/21/18  1:31 PM  Result Value Ref Range Status   Campylobacter species NOT DETECTED NOT DETECTED Final   Plesimonas shigelloides NOT DETECTED NOT DETECTED Final   Salmonella species NOT DETECTED NOT DETECTED Final   Yersinia enterocolitica NOT DETECTED NOT DETECTED Final   Vibrio species NOT DETECTED NOT DETECTED Final   Vibrio cholerae NOT DETECTED NOT DETECTED Final   Enteroaggregative E coli (EAEC) NOT DETECTED NOT DETECTED Final   Enteropathogenic E coli (EPEC) NOT DETECTED NOT DETECTED Final   Enterotoxigenic E coli (ETEC) NOT DETECTED NOT DETECTED Final   Shiga like toxin producing E coli (STEC) NOT DETECTED NOT DETECTED Final   Shigella/Enteroinvasive E coli (EIEC) NOT DETECTED NOT DETECTED Final   Cryptosporidium NOT DETECTED  NOT DETECTED Final   Cyclospora cayetanensis NOT DETECTED NOT DETECTED Final   Entamoeba histolytica NOT DETECTED NOT DETECTED Final   Giardia lamblia NOT DETECTED NOT DETECTED Final   Adenovirus F40/41 NOT DETECTED NOT DETECTED Final   Astrovirus NOT DETECTED NOT DETECTED Final   Norovirus GI/GII NOT DETECTED NOT DETECTED Final   Rotavirus A NOT DETECTED NOT DETECTED Final   Sapovirus (I, II, IV, and V) NOT DETECTED NOT DETECTED Final    Comment: Performed at [facility], [address] [place], [place] [address]  C Difficile Quick Screen w PCR reflex     Status: Abnormal   Collection Time: 07/21/18  1:32 PM   Result Value Ref Range Status   C Diff antigen POSITIVE (A) NEGATIVE Final   C Diff toxin POSITIVE (A) NEGATIVE Final   C Diff interpretation Toxin producing C. difficile detected.  Final    Comment: CRITICAL RESULT CALLED TO, READ BACK BY AND VERIFIED WITH: S.PICKETT AT 4098 ON 07/21/18 BY N.THOMPSON Performed at [facility], [place] [address] [place], [place] [address]      Labs: BNP (last 3 results) Recent Labs    07/14/18 0249  BNP 782.9*   Basic Metabolic Panel: Recent Labs  Lab 07/24/18 0355  NA 136  K 3.7  CL 109  CO2 20*  GLUCOSE 91  BUN 6*  CREATININE 0.82  CALCIUM 7.6*   Liver Function Tests: Recent Labs  Lab 07/24/18 0355  AST 14*  ALT 9  ALKPHOS 52  BILITOT 0.7  PROT 4.9*  ALBUMIN 1.9*   No results for input(s): LIPASE, AMYLASE in the last 168 hours. No results for input(s): AMMONIA in the last 168 hours. CBC: Recent Labs  Lab 07/23/18 0403 07/24/18 0355  WBC 5.7 4.5  HGB 9.5* 9.3*  HCT 32.2* 31.4*  MCV 91.0 91.0  PLT 215 235   Cardiac Enzymes: No results for input(s): CKTOTAL, CKMB, CKMBINDEX, TROPONINI in the last 168 hours. BNP: Invalid input(s): POCBNP CBG: No results for input(s): GLUCAP in the last 168 hours. D-Dimer No results for input(s): DDIMER in the last 72 hours. Hgb A1c No results for input(s): HGBA1C in the last 72 hours. Lipid Profile No results for input(s): CHOL, HDL, LDLCALC, TRIG, CHOLHDL, LDLDIRECT in the last 72 hours. Thyroid function studies No results for input(s): TSH, T4TOTAL, T3FREE, THYROIDAB in the last 72 hours.  Invalid input(s): FREET3 Anemia work up No results for input(s): VITAMINB12, FOLATE, FERRITIN, TIBC, IRON, RETICCTPCT in the last 72 hours. Urinalysis    Component Value Date/Time   COLORURINE YELLOW 07/13/2018 1420   APPEARANCEUR HAZY (A) 07/13/2018 1420   LABSPEC 1.017 07/13/2018 1420   PHURINE 5.0 07/13/2018 1420   GLUCOSEU NEGATIVE 07/13/2018 1420   HGBUR SMALL  (A) 07/13/2018 1420   BILIRUBINUR NEGATIVE 07/13/2018 1420   KETONESUR 5 (A) 07/13/2018 1420   PROTEINUR 30 (A) 07/13/2018 1420   NITRITE NEGATIVE 07/13/2018 1420   LEUKOCYTESUR NEGATIVE 07/13/2018 1420   Sepsis Labs Invalid input(s): PROCALCITONIN,  WBC,  LACTICIDVEN Microbiology Recent Results (from the past 240 hour(s))  Gastrointestinal Panel by PCR , Stool     Status: None   Collection Time: 07/21/18  1:31 PM  Result Value Ref Range Status   Campylobacter species NOT DETECTED NOT DETECTED Final   Plesimonas shigelloides NOT DETECTED NOT DETECTED Final   Salmonella species NOT DETECTED NOT DETECTED Final   Yersinia enterocolitica NOT DETECTED NOT DETECTED Final   Vibrio species NOT DETECTED NOT  DETECTED Final   Vibrio cholerae NOT DETECTED NOT DETECTED Final   Enteroaggregative E coli (EAEC) NOT DETECTED NOT DETECTED Final   Enteropathogenic E coli (EPEC) NOT DETECTED NOT DETECTED Final   Enterotoxigenic E coli (ETEC) NOT DETECTED NOT DETECTED Final   Shiga like toxin producing E coli (STEC) NOT DETECTED NOT DETECTED Final   Shigella/Enteroinvasive E coli (EIEC) NOT DETECTED NOT DETECTED Final   Cryptosporidium NOT DETECTED NOT DETECTED Final   Cyclospora cayetanensis NOT DETECTED NOT DETECTED Final   Entamoeba histolytica NOT DETECTED NOT DETECTED Final   Giardia lamblia NOT DETECTED NOT DETECTED Final   Adenovirus F40/41 NOT DETECTED NOT DETECTED Final   Astrovirus NOT DETECTED NOT DETECTED Final   Norovirus GI/GII NOT DETECTED NOT DETECTED Final   Rotavirus A NOT DETECTED NOT DETECTED Final   Sapovirus (I, II, IV, and V) NOT DETECTED NOT DETECTED Final    Comment: Performed at [facility], [address] [place], [place] [address]  C Difficile Quick Screen w PCR reflex     Status: Abnormal   Collection Time: 07/21/18  1:32 PM  Result Value Ref Range Status   C Diff antigen POSITIVE (A) NEGATIVE Final   C Diff toxin POSITIVE (A) NEGATIVE Final   C Diff  interpretation Toxin producing C. difficile detected.  Final    Comment: CRITICAL RESULT CALLED TO, READ BACK BY AND VERIFIED WITH: S.PICKETT AT 2820 ON 07/21/18 BY N.THOMPSON Performed at [facility], [place] [address], [place], [place]  [address]    Time spent: 30 min   SIGNED:   Marylu Lund, MD  [facility] 07/29/2018, 4:54 PM  If 7PM-7AM, please contact night-coverage

## 2018-07-24 NOTE — TOC Initial Note (Signed)
Transition of Care [facility]) - Initial/Assessment Note    Patient Details  Name: [name] MRN: [id number] Date of Birth: 25-Jan-1934  Transition of [facility]) CM/SW Contact:    Purcell Mouton, RN Phone Number: 07/24/2018, 3:52 PM  Clinical Narrative:   Pt was active with [facility] and plan to return home with [facility].                Expected Discharge Plan: [place] Barriers to Discharge: No Barriers Identified   Patient Goals and CMS Choice Patient states their goals for this hospitalization and ongoing recovery are:: To feel better. CMS Medicare.gov Compare Post Acute Care list provided to:: Patient Choice offered to / list presented to : Patient  Expected Discharge Plan and Services Expected Discharge Plan: [place]   Discharge Planning Services: CM Consult Post Acute Care Choice: [place] arrangements for the past 2 months: Single Family Home Expected Discharge Date: 07/24/18                   HH Arranged: RN, PT [place] Agency: [place]  Prior Living Arrangements/Services Living arrangements for the past 2 months: [place] Lives with:: Adult Children Patient language and need for interpreter reviewed:: No Do you feel safe going back to the place where you live?: Yes          Current home services: Home RN, Home PT Criminal Activity/Legal Involvement Pertinent to Current Situation/Hospitalization: No - Comment as needed  Activities of Daily Living Home Assistive Devices/Equipment: Dentures (specify type), Eyeglasses, Shower chair with back, Walker (specify type)(front wheel walker, upper and lower dentures) ADL Screening (condition at time of admission) Patient's cognitive ability adequate to safely complete daily activities?: Yes Is the patient deaf or have difficulty hearing?: No Does the patient have difficulty seeing, even when wearing glasses/contacts?: No Does  the patient have difficulty concentrating, remembering, or making decisions?: No Patient able to express need for assistance with ADLs?: Yes Does the patient have difficulty dressing or bathing?: No Independently performs ADLs?: Yes (appropriate for developmental age) Does the patient have difficulty walking or climbing stairs?: Yes Weakness of Legs: Both Weakness of Arms/Hands: None  Permission Sought/Granted Permission sought to share information with : Case Manager                Emotional Assessment Appearance:: Appears stated age   Affect (typically observed): Accepting, Pleasant Orientation: : Oriented to Self, Oriented to Place, Oriented to  Time, Oriented to Situation      Admission diagnosis:  Cough [R05] Hypokalemia [E87.6] Intractable vomiting with nausea, unspecified vomiting type [R11.2] Patient Active Problem List   Diagnosis Date Noted  . Enteritis due to Clostridium difficile   . Malnutrition of moderate degree 07/15/2018  . Cough   . Ileus ([place])   . Hypokalemia 07/13/2018  . Low blood magnesium 07/13/2018  . Vomiting 07/13/2018  . Loss of weight 06/27/2018  . Rhabdomyolysis 05/26/2018  . History of CVA (cerebrovascular accident) 05/26/2018  . Essential hypertension 05/26/2018  . Hyperlipidemia 05/26/2018  . AKI (acute kidney injury) ([place]) 05/26/2018  . Transaminitis 05/26/2018  . Elevated troponin 05/26/2018  . Gait abnormality 07/23/2016  . Pacemaker-Medtronic 08/07/2011  . Atrial fibrillation ([place]) 08/07/2011  . Daytime somnolence 08/07/2011  . HYPERTENSION, HEART CONTROLLED W/O ASSOC CHF 06/07/2010  . AV BLOCK, COMPLETE 06/07/2010  . Cerebral artery occlusion with cerebral infarction ([place]) 06/07/2010   PCP:  Randel Books, FNP Pharmacy:   [id number] - [place], [place] [address] Phone: [phone] Fax: [phone]     Social Determinants of Health  (SDOH) Interventions    Readmission Risk Interventions No flowsheet data found.

## 2018-07-24 NOTE — Progress Notes (Signed)
Physical Therapy Treatment Patient Details Name: [name] MRN: [id number] DOB: 02-24-1934 Today's Date: 07/24/2018    History of Present Illness Pt is a 85 y.o. female admitted 3/15 with nausea, vomiting, and weakness. Pt had been at [facility] previously after a fall resulting in rhabdomyolysis and acute on chronic CKD. Pt had been home one day, then returned to ED for this hospitalization. PMH includes pacemaker, HTN, CVAx3 with L weakness per pt report, CHF, HLD.    PT Comments    Pt agreeable to ambulate around bed and over to recliner however declined further mobility today.  Pt reports study this morning and feeling fatigued. Pt reports possible d/c home tomorrow.  If home, would benefit from initial 24/7 assist for safety.    Follow Up Recommendations  Supervision for mobility/OOB;SNF     Equipment Recommendations  None recommended by PT    Recommendations for Other Services       Precautions / Restrictions Precautions Precautions: Fall    Mobility  Bed Mobility Overal bed mobility: Needs Assistance Bed Mobility: Supine to Sit     Supine to sit: Supervision        Transfers Overall transfer level: Needs assistance Equipment used: Rolling walker (2 wheeled) Transfers: Sit to/from Stand Sit to Stand: Min guard         General transfer comment: increased time and effort, cues for hand placement  Ambulation/Gait Ambulation/Gait assistance: Min guard Gait Distance (Feet): 13 Feet Assistive device: Rolling walker (2 wheeled) Gait Pattern/deviations: Step-through pattern;Decreased stride length     General Gait Details: min/guard for safety, ambulated around bed to recliner - declined further ambulation   Stairs             Wheelchair Mobility    Modified Rankin (Stroke Patients Only)       Balance                                            Cognition Arousal/Alertness: Awake/alert Behavior During Therapy: WFL  for tasks assessed/performed Overall Cognitive Status: Within Functional Limits for tasks assessed                                        Exercises      General Comments        Pertinent Vitals/Pain Pain Assessment: No/denies pain    Home Living                      Prior Function            PT Goals (current goals can now be found in the care plan section) Progress towards PT goals: Progressing toward goals    Frequency    Min 3X/week      PT Plan Current plan remains appropriate    Co-evaluation              AM-PAC PT "6 Clicks" Mobility   Outcome Measure  Help needed turning from your back to your side while in a flat bed without using bedrails?: A Little Help needed moving from lying on your back to sitting on the side of a flat bed without using bedrails?: A Little Help needed moving to and from a bed to a chair (including a wheelchair)?: A  Little Help needed standing up from a chair using your arms (e.g., wheelchair or bedside chair)?: A Little Help needed to walk in hospital room?: A Little Help needed climbing 3-5 steps with a railing? : A Little 6 Click Score: 18    End of Session   Activity Tolerance: Patient limited by fatigue Patient left: in chair;with call bell/phone within reach;with chair alarm set   PT Visit Diagnosis: Difficulty in walking, not elsewhere classified (R26.2)     Time: 7915-0413 PT Time Calculation (min) (ACUTE ONLY): 10 min  Charges:  $Gait Training: 8-22 mins                     Carmelia Bake, PT, [place] Office: [phone] Pager: [phone]  Trena Platt 07/24/2018, 3:31 PM

## 2018-07-24 NOTE — Progress Notes (Addendum)
Pt was active with [facility] and plan to continue at home with HHRN/PT/NA, referral was given to in house rep.

## 2018-08-01 ENCOUNTER — Telehealth: Payer: Medicare Other | Admitting: Physician Assistant

## 2018-08-01 DIAGNOSIS — R112 Nausea with vomiting, unspecified: Secondary | ICD-10-CM

## 2018-08-01 NOTE — Progress Notes (Signed)
Based on what you shared with me, I feel your condition warrants further evaluation and I recommend that you be seen for a face to face office visit.     NOTE: If you entered your credit card information for this eVisit, you will not be charged. You may see a "hold" on your card for the $35 but that hold will drop off and you will not have a charge processed.  If you are having a true medical emergency please call 911.   . [facility] a Provider at this Location  [address] [place], [place] [address] . 10 am to 8 pm Monday-Friday . 12 pm to 8 pm Saturday-Sunday   . [facility] [place] a Provider at this Location  [address] [place], [place], [place] [address] . 8 am to 8 pm Monday-Friday . 9 am to 6 pm Saturday . 11 am to 6 pm Sunday   . [facility] [place] Get Driving Directions  [address] [place], [place] [address] . 8 am to 8 pm Monday-Friday . 8 am to 4 pm Saturday-Sunday   Your e-visit answers were reviewed by a board certified advanced clinical practitioner to complete your personal care plan.  Thank you for using e-Visits.

## 2018-08-05 ENCOUNTER — Telehealth: Payer: Self-pay | Admitting: Internal Medicine

## 2018-08-05 NOTE — Telephone Encounter (Signed)
-----   Message from Hoy Register sent at 08/05/2018  9:28 AM EDT ----- Regarding: Consult Hi Dr. Carlean Purl,   Cyndi Bender, [place] [facility] [place] requested a call regarding this pt.  [phone]

## 2018-08-05 NOTE — Telephone Encounter (Signed)
Patient had telehealth visit after recent hospital dc Still having problems with vomiting of solid food.  Able to tolerate liquids.  Mr. [name] covering for her PCP - he saw that I had done her EGD in hospital.- family was asking for her to see GI again  I reviewed her chart and cannot see any other work-up to do.  She seems to have had this problem ever since her stroke but extensive w/u has not revealed a cause for this problem (EGD - ulcer in cardia, neg MBS, neg CT abd/pelvis as to this problem, she did have C diff, NL GES, Reglan did not help either)  I have suggested continue liquid diet and if she is unable to maintain adequate intake could need gastrostomy tube placement.  Her primary GI MD appears to be Dr. Bryan Lemma so will cc him

## 2018-08-05 NOTE — Telephone Encounter (Signed)
Left message for patient's niece to call back and schedule a follow up virtual visit with Dr.Cirigliano. Dr.Cirigliano has openings on 08/06/2018 and 08/07/2018.

## 2018-08-05 NOTE — Telephone Encounter (Signed)
Thanks for looping me in, [place]. Happy to go back through her case with her and her family, and completely agree with your assessment and advice.   [name],  If she is interested, could you please schedule a follow-up virtual OV with me for Ms. Qazi and her family so we can discuss in detail? Thanks!

## 2018-08-05 NOTE — Telephone Encounter (Signed)
Zoom visit schedule for 4/9 at 2:30pm with Dr. Bryan Lemma.

## 2018-08-07 ENCOUNTER — Encounter: Payer: Self-pay | Admitting: Gastroenterology

## 2018-08-07 ENCOUNTER — Other Ambulatory Visit: Payer: Self-pay

## 2018-08-07 ENCOUNTER — Telehealth (INDEPENDENT_AMBULATORY_CARE_PROVIDER_SITE_OTHER): Payer: Medicare Other | Admitting: Gastroenterology

## 2018-08-07 VITALS — Ht 62.0 in | Wt 189.0 lb

## 2018-08-07 DIAGNOSIS — Z8673 Personal history of transient ischemic attack (TIA), and cerebral infarction without residual deficits: Secondary | ICD-10-CM | POA: Diagnosis not present

## 2018-08-07 DIAGNOSIS — F40228 Other natural environment type phobia: Secondary | ICD-10-CM

## 2018-08-07 DIAGNOSIS — R112 Nausea with vomiting, unspecified: Secondary | ICD-10-CM | POA: Diagnosis not present

## 2018-08-07 DIAGNOSIS — Z8619 Personal history of other infectious and parasitic diseases: Secondary | ICD-10-CM

## 2018-08-07 DIAGNOSIS — K2210 Ulcer of esophagus without bleeding: Secondary | ICD-10-CM

## 2018-08-07 NOTE — Progress Notes (Signed)
Chief Complaint: Nausea, vomiting  Referring Provider:     Cyndi Bender, PA ([place])   HPI:    Due to current restrictions/limitations of in-office visits due to the COVID-19 pandemic, this scheduled clinical appointment was converted to a telehealth virtual consultation using WebEx/Zoom.  -Time of medical discussion: 25 minutes -The patient did consent to this virtual visit and is aware of possible charges through their insurance for this visit.  -Names of all parties present: [name] (patient), [name] (patient's daughter), Gerrit Heck, DO, [facility] (physician) -Patient location: Home -Physician location: Office  [name] is a 85 y.o. female with a history of HTN, AV block s/p pacemaker, CKD3, H/O CVA, presenting to the Gastroenterology Clinic for evaluation of nausea and vomiting.  She was recently admitted from 3/15-31 for recurrent/intractable nausea and vomiting.  She had an extensive evaluation as outlined below.  She was treated with Reglan without much improvement.  Given Zofran which improves nausea.  No associated abdominal pain, fever.  She was tolerating solid food at the time of hospital discharge, but follow-up with her PCM earlier this week she revealed that she had ongoing symptoms, requesting referral back to GI.  - GES (3/26): Normal - CT abdomen/pelvis (3/23): Cholelithiasis without cholecystitis, colon wall thickening consistent with acute colitis (C. difficile).  Otherwise normal UGI structures.  2.5 cm right renal mass suspicious for RCC. -MBS (3/18): No limitations, no treatment recommended - EGD (3/17): 10 mm ulcer at [place] (biopsies benign), small HH, moderately tortuous esophagus, normal stomach and duodenum - CT head (3/17): No acute intracranial pathology -Abdominal x-ray (3/16): Air-fluid levels consistent with low-grade adynamic ileus or gastroenteritis, otherwise normal - Esophagram (2/29):  Normal, small HH -AAS (2/28): Normal - Abdominal ultrasound (1/31): Cholelithiasis without cholecystitis, no obstruction  Separately, she was treated with p.o. vancomycin for C. difficile.  Bowel habits back to normal now.  Additionally, with incidentally noted 2.5 cm right renal mass suspicious for RCC on CT on recent admission.  Has plan for Urology follow-up.  Today, she states that she is improved since hospital discharge, but only maintaining a liquid diet.  On further questioning, her symptoms seem to be more related to fear of having emesis, rather than actually having nausea/vomiting.  She does endorse intermittent nausea, that does not seem to be related to p.o. intake, time of day, etc., and seems to improve with Zofran.  Daughter assists in her history.  Symptoms essentially started during admission in 04/2018 for CVA.  She will essentially chew food then remove it with a napkin without any attempts at swallowing.  Daughter states that she had chicken noodle soup over the weekend and no issue swallowing and no emesis.  Patient reports that she is just fearful that she may vomit, and therefore avoids solid foods.  No issue with liquids or blender consistency foods (i.e. milkshake etc.).  Weight is stable.  Past medical history, past surgical history, social history, family history, medications, and allergies reviewed in the chart and with patient over Zoom.   Past Medical History:  Diagnosis Date  . Complete heart block ([place])   . Gait abnormality 07/23/2016  . History of hypertension   . History of stroke   . Hyperhomocysteinemia ([place])   . Hyperlipidemia   . Hypertension   . Hypokalemia 07/13/2018  . Low blood magnesium 07/13/2018  . Pacemaker-Medtronic 08/07/2011  . Vomiting 07/13/2018  Past Surgical History:  Procedure Laterality Date  . BIOPSY  07/15/2018   Procedure: BIOPSY;  Surgeon: [name], MD;  Location: [facility];  Service: Gastroenterology;;  . CARDIAC  CATHETERIZATION    . ESOPHAGOGASTRODUODENOSCOPY Left 07/15/2018   Procedure: ESOPHAGOGASTRODUODENOSCOPY (EGD);  Surgeon: [name], MD;  Location: [facility];  Service: Gastroenterology;  Laterality: Left;  . PACEMAKER INSERTION     Medtronic Enpulse dual-chamber pacemaker  . PERMANENT PACEMAKER GENERATOR CHANGE N/A 07/30/2013   Procedure: PERMANENT PACEMAKER GENERATOR CHANGE;  Surgeon: [name], MD;  Location: [facility] CATH LAB;  Service: Cardiovascular;  Laterality: N/A;   Family History  Problem Relation Age of Onset  . Hypertension Mother    Social History   Tobacco Use  . Smoking status: Former Smoker    [place] attempt to quit: 07/25/2006    Years since quitting: 12.0  . Smokeless tobacco: Never Used  Substance Use Topics  . Alcohol use: No  . Drug use: No   Current Outpatient Medications  Medication Sig Dispense Refill  . acetaminophen (TYLENOL) 500 MG tablet Take 500-1,000 mg by mouth every 6 (six) hours as needed for moderate pain.     [name] aspirin 81 MG tablet Take 81 mg by mouth daily.    . Blood Pressure KIT Automated blood pressure measuring device. 1 each 0  . clopidogrel (PLAVIX) 75 MG tablet Take 75 mg by mouth daily with breakfast.    . LUMIGAN 0.01 % SOLN Place 1 drop into both eyes at bedtime.  4  . ondansetron (ZOFRAN) 4 MG tablet Take 4 mg by mouth every 8 (eight) hours as needed for nausea or vomiting.     . pantoprazole (PROTONIX) 40 MG tablet Take 1 tablet (40 mg total) by mouth daily for 30 days. 30 tablet 0  . saccharomyces boulardii (FLORASTOR) 250 MG capsule Take 1 capsule (250 mg total) by mouth 2 (two) times daily for 30 days. 60 capsule 0  . simvastatin (ZOCOR) 40 MG tablet Take 1 tablet (40 mg total) by mouth at bedtime. 90 tablet 3   No current facility-administered medications for this visit.    No Known Allergies   Review of Systems: All systems reviewed and negative except where noted in HPI.     Physical Exam:    Physical exam not  completed due to the nature of this telehealth communication.  Patient was otherwise alert and oriented and well communicative.   ASSESSMENT AND PLAN;   1) Sitophobia 2) Nausea 3) Ulcer at GEJ  Clinical symptomatology much clear today as she is able to provide a better history of her symptoms, and much more consistent with sitophobia rather than true anatomic or functional disorder.  She has had an extensive evaluation to date.  I reviewed that evaluation with her and her daughter today, and after reviewing everything she seems to have better understanding that she has preserved anatomy and function, and much more willing to try advancing her diet.  She states that she is simply fearful of emesis, and therefore avoids solid foods.  -Provided in-depth education regarding her extensive work-up to date.  No reason to suspect aspiration given symptoms and evaluation (normal MBS, no e/o pneumonitis on imaging).  She was very thankful and seemed relieved and willing to advance her diet slowly. -Outlined a typical postoperative diet, and will plan to use a modified version of this to slowly advance her foods, more from a comfort/confidence standpoint for the patient rather than suspicion of  anatomic or functional defect. -Per patient request, sent modified postoperative diet to the patient's daughter, [name] ([email or web address]) with instructions to start with blender consistency foods x1 week, then progress to soft foods (i.e. mashed potatoes, soft pasta), then eventually to full diet as tolerated. (email note pasted below) -RTC in 2 months or sooner as needed -Repeat EGD in 12 weeks to evaluate for appropriate ulcer healing.  Was previously recommended of colonoscopy at that same time, but can discuss this again with the patient closer to that date.  Unable to schedule procedures currently due to the COVID-19 pandemic. -Resume Protonix as prescribed - Okay to continue Zofran for now - Okay to  continue Ensure for increased caloric intake -No plan for feeding tube or repeat endoscopic procedures urgently  4) History of C. difficile colitis: Completed vancomycin.  No lower GI symptoms.   Lavena Bullion, DO, FACG  08/07/2018, 2:42 PM  CC: Cyndi Bender, PA Randel Books, FNP   Email to patient's daughter: [name] and Sky,  Attached is the recommended diet plan that we discussed today. Again, this is a post operative diet, but we are using this diet to advance in a similar fashion as my post surgical patients. Start with Stage 3, soft/blended diet. Start with the blended consistency foods and advance to the soft foods as tolerated, then maintain that diet for 1-2 weeks. Then, go ahead and advance to Stage 4 foods as tolerated, and again maintain that diet for 2 weeks. After that point, and as long as you are feeling well, go ahead and advance to any foods as tolerated.  The 1-2 week plan at each stage is not a "hard rule" and you can progress at your own speed as tolerated.  Thank you again for meeting with me today via virtual office appointment, it was very nice to talk with you both. Have a great rest of your day, and stay safe. I look forward to checking in with you in approximately 2 months. As always, do not hesitate to contact my office with any questions or concerns.   Respectfully, Gerrit Heck, [facility]  [phone]

## 2018-08-07 NOTE — Patient Instructions (Signed)
Follow up in 2 months

## 2018-08-11 ENCOUNTER — Ambulatory Visit: Payer: Medicare Other | Admitting: Internal Medicine

## 2018-08-18 ENCOUNTER — Other Ambulatory Visit: Payer: Self-pay

## 2018-08-18 ENCOUNTER — Telehealth (INDEPENDENT_AMBULATORY_CARE_PROVIDER_SITE_OTHER): Payer: Medicare Other | Admitting: Urology

## 2018-08-18 DIAGNOSIS — N2889 Other specified disorders of kidney and ureter: Secondary | ICD-10-CM

## 2018-08-18 NOTE — Progress Notes (Signed)
Virtual Visit via Video Note  I connected with [name] on 08/18/18 at  1:00 PM EDT by a video enabled telemedicine application and verified that I am speaking with the correct person using two identifiers.   I discussed the limitations of evaluation and management by telemedicine and the availability of in person appointments. The patient expressed understanding and agreed to proceed.  History of Present Illness: 85 year old female with multiple medical comorbidities who presents today as a new patient via virtual visit accompanied by her niece for further evaluation of incidental 2.5 cm right lower pole renal mass.  [name] has had multiple admissions and evaluation for ongoing nausea vomiting, p.o. intolerance, and weight loss since January.  She is lost over 20 pounds since that time.  As part of her evaluation, she underwent a CT abdomen pelvis with contrast on 07/20/2018  which revealed an incidental 2.5 cm right anterior lower pole renal mass, suspicious for possible underlying malignancy.  CT scan was personally reviewed today and agree with radiologic interpretation.  There is no evidence of metastatic disease or lymphadenopathy.  Denies any flank pain.  No gross hematuria or significant urinary symptoms.  Her niece does note that she had a recent episode of rhabdomyolysis after fall with CKD but her creatinine is returned back down to baseline.  She has multiple medical comorbidities including history of stroke, CHF with pacemaker, recent admission for C. difficile colitis, rhabdomyolysis following a fall, amongst others.  Notably, her ICD is not MRI compatible per her niece.  Recent creatinine 0.82 as of 07/24/2018.   Observations/Objective: Somewhat flat affect, niece asking most of the questions.  Obese.  Assessment and Plan:  1. Right renal mass 2.5 cm right lower pole renal mass  A solid renal mass raises the suspicion of primary renal malignancy.  We discussed  this in detail and in regards to the spectrum of renal masses which includes cysts (pure cysts are considered benign), solid masses and everything in between. The risk of metastasis increases as the size of solid renal mass increases. In general, it is believed that the risk of metastasis for renal masses less than 3-4 cm is small (up to approximately 5%) based mainly on large retrospective studies. In some cases and especially in patients of older age and multiple comorbidities a surveillance approach may be appropriate. The treatment of solid renal masses includes: surveillance, cryoablation (percutaneous and laparoscopic) in addition to partial and complete nephrectomy (each with option of laparoscopic, robotic and open depending on appropriateness). Furthermore, nephrectomy appears to be an independent risk factor for the development of chronic kidney disease suggesting that nephron sparing approaches should be implored whenever feasible. We reviewed these options in context of the patients current situation as well as the pros and cons of each. For cystic renal masses, we reviewed the Bosniak classification and discussed that Bosniak 3 lesions harbor a 50% chance of malignancy whereas Bosniak 4 cysts have a solid and 90-95% are malignant in nature.   Based on the features of this mass, MRI would be helpful to help rule in or rule out malignancy as it is not completely characterized with a CT scan but highly suspicious.  Fortunately, her eyes not possible in light of her pacemaker.  Based on location, biopsy would be somewhat challenging.  Given her age and medical comorbidities, would most strongly recommend surveillance at this point in time.  She is agreeable to plan for CT abdomen with and without contrast 23-month interval, July  2020 to assess for interval growth.  We can reassess treatment options at that point in time based on findings.  Both she and her niece are agreeable this plan.  All  questions were answered in detail.   Follow Up Instructions: F/u  4 months with Ct abd with and without contrast   I discussed the assessment and treatment plan with the patient. The patient was provided an opportunity to ask questions and all were answered. The patient agreed with the plan and demonstrated an understanding of the instructions.   The patient was advised to call back or seek an in-person evaluation if the symptoms worsen or if the condition fails to improve as anticipated.  I provided 30 minutes of non-face-to-face time during this encounter.   Hollice Espy, MD

## 2018-09-25 ENCOUNTER — Other Ambulatory Visit: Payer: Self-pay

## 2018-09-25 ENCOUNTER — Encounter: Payer: Self-pay | Admitting: Gastroenterology

## 2018-09-25 ENCOUNTER — Telehealth (INDEPENDENT_AMBULATORY_CARE_PROVIDER_SITE_OTHER): Payer: Medicare Other | Admitting: Gastroenterology

## 2018-09-25 VITALS — Ht 62.0 in | Wt 174.5 lb

## 2018-09-25 DIAGNOSIS — K2210 Ulcer of esophagus without bleeding: Secondary | ICD-10-CM

## 2018-09-25 DIAGNOSIS — N2889 Other specified disorders of kidney and ureter: Secondary | ICD-10-CM | POA: Diagnosis not present

## 2018-09-25 DIAGNOSIS — F40228 Other natural environment type phobia: Secondary | ICD-10-CM

## 2018-09-25 DIAGNOSIS — R110 Nausea: Secondary | ICD-10-CM | POA: Diagnosis not present

## 2018-09-25 DIAGNOSIS — Z8619 Personal history of other infectious and parasitic diseases: Secondary | ICD-10-CM

## 2018-09-25 NOTE — Progress Notes (Signed)
Chief Complaint: Sitophobia, nausea, ulcer at GE junction   Referring Provider:     Cyndi Bender, PA [facility])  GI History: [name] is a 85 y.o. female with a history of HTN, AV block s/p pacemaker, CKD3, CVA in 04/2018, hx of C. difficile colitis in 06/2018 (resolved with p.o. vancomycin), right RCC diagnosed 06/2018 incidentally, admitted 3/15-31 for recurrent/intractable nausea and vomiting.  Extensive evaluation as below:  - GES (3/26): Normal - CT abdomen/pelvis (3/23): Cholelithiasis without cholecystitis, colon wall thickening consistent with acute colitis (C. difficile).  Otherwise normal UGI structures.  2.5 cm right renal mass suspicious for RCC. -MBS (3/18): No limitations, no treatment recommended - EGD (3/17): 10 mm ulcer at [place] (biopsies benign), small HH, moderately tortuous esophagus, normal stomach and duodenum - CT head (3/17): No acute intracranial pathology -Abdominal x-ray (3/16): Air-fluid levels consistent with low-grade adynamic ileus or gastroenteritis, otherwise normal - Esophagram (2/29): Normal, small HH -AAS (2/28): Normal - Abdominal ultrasound (1/31): Cholelithiasis without cholecystitis, no obstruction  Allison seen in 07/2018 with significant clinical improvement at that time.  History actually more consistent with fear of having emesis which limits her p.o. intake, rather than actual nausea/vomiting.  Does have intermittent nausea, unrelated to p.o. intake, time of day, etc., responsive to Zofran.  Symptoms started during admission in 04/2018 for CVA.  No dysphagia or odynophagia; just fearful that she may vomit, and therefore was avoiding solid foods (no issue with liquids or blender consistency foods).    HPI:    Due to current restrictions/limitations of in-office visits due to the COVID-19 pandemic, this scheduled clinical appointment was converted to a telehealth consultation via telephone.  -Time of medical  discussion: 20 minutes -The patient did consent to this telephone visit and is aware of possible charges through their insurance for this visit.  -Names of all parties present: [name] (patient), Gerrit Heck, DO, [facility] (physician)  Margarete [name] is a 85 y.o. female presenting to the Gastroenterology Clinic for routine follow-up.  She was Allison seen by me on 08/07/2018 for sitophobia, nausea, and hospital follow-up from admission in 06/2018 (C. difficile colitis, GEJ ulcer, incidentally noted right RCC).  Since then, has advanced her diet slowly (similar to typical post fundoplication diet) and back to baseline PO intake now. Tolerating all PO intake without any issues. Nausea resolved.  Still no dysphagia or odynophagia.  No lower GI symptoms.  Denies abdominal pain.  Having regular bowel habits without hematochezia or melena.  Has been seen by Dr. Hollice Espy in the Urology Clinic in 07/2018 for new RCC, with plan for surveillance, with repeat CT in approximately 10/2018 and follow-up in Urology.  Past medical history, past surgical history, social history, family history, medications, and allergies reviewed in the chart and with patient over the phone.  Past Medical History:  Diagnosis Date  . Complete heart block ([place])   . Gait abnormality 07/23/2016  . History of hypertension   . History of stroke   . Hyperhomocysteinemia ([place])   . Hyperlipidemia   . Hypertension   . Hypokalemia 07/13/2018  . Low blood magnesium 07/13/2018  . Pacemaker-Medtronic 08/07/2011  . Vomiting 07/13/2018     Past Surgical History:  Procedure Laterality Date  . BIOPSY  07/15/2018   Procedure: BIOPSY;  Surgeon: [name], MD;  Location: [facility];  Service: Gastroenterology;;  . CARDIAC CATHETERIZATION    .  ESOPHAGOGASTRODUODENOSCOPY Left 07/15/2018   Procedure: ESOPHAGOGASTRODUODENOSCOPY (EGD);  Surgeon: [name], MD;  Location: [facility];  Service: Gastroenterology;   Laterality: Left;  . PACEMAKER INSERTION     Medtronic Enpulse dual-chamber pacemaker  . PERMANENT PACEMAKER GENERATOR CHANGE N/A 07/30/2013   Procedure: PERMANENT PACEMAKER GENERATOR CHANGE;  Surgeon: [name], MD;  Location: [facility] CATH LAB;  Service: Cardiovascular;  Laterality: N/A;   Family History  Problem Relation Age of Onset  . Hypertension Mother   . Colon cancer Neg Hx   . Esophageal cancer Neg Hx    Social History   Tobacco Use  . Smoking status: Former Smoker    Allison attempt to quit: 07/25/2006    Years since quitting: 12.1  . Smokeless tobacco: Never Used  Substance Use Topics  . Alcohol use: No  . Drug use: No   Current Outpatient Medications  Medication Sig Dispense Refill  . acetaminophen (TYLENOL) 500 MG tablet Take 500-1,000 mg by mouth every 6 (six) hours as needed for moderate pain.     [name] aspirin 81 MG tablet Take 81 mg by mouth daily.    . Blood Pressure KIT Automated blood pressure measuring device. 1 each 0  . clopidogrel (PLAVIX) 75 MG tablet Take 75 mg by mouth daily with breakfast.    . LUMIGAN 0.01 % SOLN Place 1 drop into both eyes at bedtime.  4  . simvastatin (ZOCOR) 40 MG tablet Take 1 tablet (40 mg total) by mouth at bedtime. 90 tablet 3  . ondansetron (ZOFRAN) 4 MG tablet Take 4 mg by mouth every 8 (eight) hours as needed for nausea or vomiting.      No current facility-administered medications for this visit.    No Known Allergies   Review of Systems: All systems reviewed and negative except where noted in HPI.     Physical Exam:    Physical exam not completed due to the nature of this telehealth communication.  Patient was otherwise alert and oriented and well communicative.   ASSESSMENT AND PLAN;   1) Sitophobia 2) Nausea Symptoms resolved.  Tolerating all p.o. intake.  No complaints. -Resume p.o. intake as tolerated  3) Ulcer at the GE junction: Biopsies benign.  Discussed repeat EGD in 10/2018 to ensure appropriate mucosal  healing.  Completed course of high-dose Protonix.  She states that since she otherwise feels well and tolerating all p.o. intake, she prefer not to have this procedure.  Given age, comorbidities, current clinical status, feel this is reasonable  4) History of C. difficile colitis: Lower GI symptoms resolved.  Stools back to baseline.  Had previously discussed colonoscopy given colitis noted on CT (from C. difficile).  As above, she declined colonoscopy at this time.  Given her lack of lower GI symptoms, age, comorbidities, clear etiology, feel this is also a reasonable decision.  5) Right renal mass: -Follow-up with urology as scheduled  RTC in 6 to 12 months or sooner as needed   Lavena Bullion, DO, FACG  09/25/2018, 1:13 PM   Randel Books, FNP  Cyndi Bender, Amber ([place])

## 2018-09-25 NOTE — Patient Instructions (Signed)
If you are age 83 or older, your body mass index should be between 23-30. Your Body mass index is 31.92 kg/m. If this is out of the aforementioned range listed, please consider follow up with your Primary Care Provider.  If you are age 22 or younger, your body mass index should be between 19-25. Your Body mass index is 31.92 kg/m. If this is out of the aformentioned range listed, please consider follow up with your Primary Care Provider.   Please follow up in 6-12 months.   It was a pleasure to see you today!  Vito Cirigliano, D.O.

## 2018-10-08 ENCOUNTER — Encounter: Payer: Medicare Other | Admitting: *Deleted

## 2018-10-09 ENCOUNTER — Telehealth: Payer: Self-pay

## 2018-10-09 NOTE — Telephone Encounter (Signed)
Left message for patient to remind of missed remote transmission.  

## 2018-10-16 ENCOUNTER — Encounter: Payer: Self-pay | Admitting: Cardiology

## 2018-10-16 ENCOUNTER — Other Ambulatory Visit: Payer: Self-pay | Admitting: Nurse Practitioner

## 2018-10-16 DIAGNOSIS — N6310 Unspecified lump in the right breast, unspecified quadrant: Secondary | ICD-10-CM

## 2018-10-16 DIAGNOSIS — R928 Other abnormal and inconclusive findings on diagnostic imaging of breast: Secondary | ICD-10-CM

## 2018-10-22 ENCOUNTER — Other Ambulatory Visit: Payer: Self-pay | Admitting: Internal Medicine

## 2018-10-22 MED ORDER — SIMVASTATIN 40 MG PO TABS: 40.0000 mg | ORAL_TABLET | Freq: Every day | ORAL | 0 refills | Status: DC

## 2018-10-27 ENCOUNTER — Ambulatory Visit
Admission: RE | Admit: 2018-10-27 | Discharge: 2018-10-27 | Disposition: A | Discharge status: Home / Self Care | Payer: Medicare Other | Source: Ambulatory Visit | Attending: Nurse Practitioner | Admitting: Nurse Practitioner

## 2018-10-27 ENCOUNTER — Other Ambulatory Visit: Payer: Self-pay

## 2018-10-27 DIAGNOSIS — R928 Other abnormal and inconclusive findings on diagnostic imaging of breast: Secondary | ICD-10-CM | POA: Diagnosis not present

## 2018-10-27 DIAGNOSIS — N6310 Unspecified lump in the right breast, unspecified quadrant: Secondary | ICD-10-CM | POA: Diagnosis present

## 2018-10-27 HISTORY — PX: BREAST BIOPSY: SHX20

## 2018-10-29 ENCOUNTER — Other Ambulatory Visit: Payer: Self-pay

## 2018-10-29 DIAGNOSIS — C50919 Malignant neoplasm of unspecified site of unspecified female breast: Secondary | ICD-10-CM

## 2018-10-29 NOTE — Progress Notes (Signed)
  Oncology Nurse Navigator Documentation  Navigator Location: [facility] (10/29/18 1400) Referral Date to RadOnc/MedOnc: 11/03/18 (10/29/18 1400) )Navigator Encounter Type: Telephone (10/29/18 1400)   Abnormal Finding Date: 10/14/18 (10/29/18 1400) Confirmed Diagnosis Date: 10/27/18 (10/29/18 1400)               Patient Visit Type: Initial (10/29/18 1400)   Barriers/Navigation Needs: Coordination of Care;Family Concerns (10/29/18 1400)   Interventions: Coordination of Care;Psycho-Social Support (10/29/18 1400)   Coordination of Care: Appts (10/29/18 1400)                  Time Spent with Patient: 45 (10/29/18 1400)   Introduced navigation to patient and family.  Scheduled surgical and Med/Onc consults.  Notified her primary care provider of patient's appointments.

## 2018-10-30 ENCOUNTER — Ambulatory Visit (INDEPENDENT_AMBULATORY_CARE_PROVIDER_SITE_OTHER): Payer: Medicare Other | Admitting: General Surgery

## 2018-10-30 ENCOUNTER — Encounter: Payer: Self-pay | Admitting: *Deleted

## 2018-10-30 ENCOUNTER — Other Ambulatory Visit: Payer: Self-pay

## 2018-10-30 VITALS — BP 144/78 | HR 89 | Temp 97.9°F | Resp 15 | Wt 175.0 lb

## 2018-10-30 DIAGNOSIS — C50411 Malignant neoplasm of upper-outer quadrant of right female breast: Secondary | ICD-10-CM | POA: Diagnosis not present

## 2018-10-30 DIAGNOSIS — Z170 Estrogen receptor positive status [ER+]: Secondary | ICD-10-CM

## 2018-10-30 NOTE — Progress Notes (Signed)
Patient ID: [name], female   DOB: Sep 03, 1933, 85 y.o.   MRN: [id number]  Chief Complaint  Patient presents with  . New Patient (Initial Visit)    Breast Cancer    HPI [name] is a 85 y.o. female here for a breast cancer discussion. She had a mammogram done in [place] on 10/14/18. She had a right breast biopsy done on 10/27/18 that showed breast cancer. She gets annual mammograms done.  She is here today with her niece, [name], who works in preadmission testing at [employer].  [name]  HPI  Past Medical History:  Diagnosis Date  . Complete heart block ([place])   . Gait abnormality 07/23/2016  . History of hypertension   . History of stroke   . Hyperhomocysteinemia ([place])   . Hyperlipidemia   . Hypertension   . Hypokalemia 07/13/2018  . Low blood magnesium 07/13/2018  . Pacemaker-Medtronic 08/07/2011  . Vomiting 07/13/2018    Past Surgical History:  Procedure Laterality Date  . BIOPSY  07/15/2018   Procedure: BIOPSY;  Surgeon: [name], MD;  Location: [facility];  Service: Gastroenterology;;  . BREAST BIOPSY Right 10/27/2018   affirm bx of mass, x clip,  INVASIVE MAMMARY CARCINOMA  . CARDIAC CATHETERIZATION    . ESOPHAGOGASTRODUODENOSCOPY Left 07/15/2018   Procedure: ESOPHAGOGASTRODUODENOSCOPY (EGD);  Surgeon: [name], MD;  Location: [facility];  Service: Gastroenterology;  Laterality: Left;  . PACEMAKER INSERTION     Medtronic Enpulse dual-chamber pacemaker  . PERMANENT PACEMAKER GENERATOR CHANGE N/A 07/30/2013   Procedure: PERMANENT PACEMAKER GENERATOR CHANGE;  Surgeon: [name], MD;  Location: [facility] CATH LAB;  Service: Cardiovascular;  Laterality: N/A;    Family History  Problem Relation Age of Onset  . Hypertension Mother   . Colon cancer Neg Hx   . Esophageal cancer Neg Hx     Social History Social History   Tobacco Use  . Smoking status: Former Smoker    Quit date: 07/25/2006    Years since quitting: 12.2  . Smokeless tobacco: Never  Used  Substance Use Topics  . Alcohol use: No  . Drug use: No    No Known Allergies  Current Outpatient Medications  Medication Sig Dispense Refill  . acetaminophen (TYLENOL) 500 MG tablet Take 500-1,000 mg by mouth every 6 (six) hours as needed for moderate pain.     [name] amLODipine (NORVASC) 5 MG tablet Take 5 mg by mouth daily.    [name] aspirin 81 MG tablet Take 81 mg by mouth daily.    . Blood Pressure KIT Automated blood pressure measuring device. 1 each 0  . clopidogrel (PLAVIX) 75 MG tablet Take 75 mg by mouth daily with breakfast.    . hydrochlorothiazide (HYDRODIURIL) 25 MG tablet Take 25 mg by mouth daily.    [name] LUMIGAN 0.01 % SOLN Place 1 drop into both eyes at bedtime.  4  . mirtazapine (REMERON) 7.5 MG tablet TAKE 1 TABLET BY MOUTH EVERYDAY AT BEDTIME    . ondansetron (ZOFRAN) 4 MG tablet Take 4 mg by mouth every 8 (eight) hours as needed for nausea or vomiting.     . pantoprazole (PROTONIX) 40 MG tablet Take 40 mg by mouth daily.    . simvastatin (ZOCOR) 40 MG tablet Take 1 tablet (40 mg total) by mouth at bedtime. Please make overdue appt with Dr. Caryl Comes before anymore refills. 1st attempt 30 tablet 0   No current facility-administered medications for this visit.  Review of Systems Review of Systems  Constitutional: Negative.   Respiratory: Negative.   Cardiovascular: Negative.     Blood pressure (!) 144/78, pulse 89, temperature 97.9 F (36.6 C), resp. rate 15, weight 175 lb (79.4 kg), SpO2 97 %.  Physical Exam Physical Exam Exam conducted with a chaperone present.  Constitutional:      Appearance: She is well-developed.  Eyes:     General: No scleral icterus.    Conjunctiva/sclera: Conjunctivae normal.  Neck:     Musculoskeletal: Neck supple.  Cardiovascular:     Rate and Rhythm: Normal rate. Rhythm irregularly irregular.     Heart sounds: Normal heart sounds.  Pulmonary:     Effort: Pulmonary effort is normal.     Breath sounds: Normal breath sounds and  air entry.  Chest:     Breasts:        Right: No inverted nipple, mass, nipple discharge, skin change or tenderness.        Left: No inverted nipple, mass, nipple discharge, skin change or tenderness.    Musculoskeletal:     Right lower leg: No edema.     Left lower leg: No edema.  Lymphadenopathy:     Cervical: No cervical adenopathy.     Upper Body:     Right upper body: No supraclavicular or axillary adenopathy.     Left upper body: No supraclavicular or axillary adenopathy.  Skin:    General: Skin is warm and dry.  Neurological:     Mental Status: She is alert and oriented to person, place, and time.  Psychiatric:        Mood and Affect: Mood normal.     Data Reviewed 2018-2019 mammograms completed at [facility] were independently reviewed as well as her more recent studies completed on October 27, 2018 at [facility] which showed a new nodular density in the right breast which underwent stereotactic biopsy.  BI-RADS-6. A. BREAST, RIGHT UPPER OUTER QUADRANT; STEREOTACTIC BIOPSY:  - INVASIVE MAMMARY CARCINOMA, NO SPECIAL TYPE.  Size of invasive carcinoma: 4 mm in this sample; carcinoma in 4 of 5  blocks  Histologic grade of invasive carcinoma: Grade 1            Glandular/tubular differentiation score: 1            Nuclear pleomorphism score: 2            Mitotic rate score: 1            Total score: 4  Ductal carcinoma in situ: Present, cribriform type, low grade  Lymphovascular invasion: Not identified  Receptor studies are pending at this time.  Comprehensive metabolic panel of July 24, 2018 was notable for depressed albumin of 1.9, creatinine of 0.8, normal electrolytes, normal liver function studies. CBC of the same date shows a normochromic anemia with a hemoglobin of 9.9, MCV of 92.7, white blood cell count of 6600 with a platelet count of 240,000. Hemoglobin has trended down since the end of February, stable to the month of  March. Upper endoscopy completed for protracted nausea and vomiting March 2020 with esophageal ulcer that was nonbleeding. Patient admitted January 2020 with rhabdomyolysis due to prolonged downtime after fall unattended at home with modest elevation of creatinine less than 3 which had returned to normal values by June 29, 2018.   Hospitalization March 2020 with persistent nausea and vomiting.  CT during that admission: 1. Thickening of the walls of the majority of the colon, most prominent/definitive within  the descending and sigmoid colon, with associated pericolonic inflammation/fluid stranding. Findings are consistent with colitis of infectious or inflammatory nature. 2. **An incidental finding of potential clinical significance has been found. 2.5 cm mass within the lower pole of the right kidney, suspicious for renal cell carcinoma. Recommend further characterization with nonemergent renal MRI. (Surveillance presently being managed by Hollice Espy, MD) 3. Cholelithiasis without evidence of acute cholecystitis.   Hemoglobin May 26, 2018: 14.8.  This value obtained at the time of her admission with rhabdomyolysis.  Assessment Clinical stage I carcinoma of the right breast.  Normochromic anemia, new onset.  Plan The patient is somewhat medically fragile, but in reasonable condition to consider primary surgical excision.  Other option would be to consider radiation therapy without surgical intervention.  Possibility of hormone suppression can be considered after receptor status is reported.  The case will be reviewed with radiation oncology and the patient will be contacted next week regarding additional recommendations.  Evaluation for source for her sudden fall in hemoglobin would be appropriate.   HPI, Physical Exam, Assessment and Plan have been scribed under the direction and in the presence of Robert Bellow, MD  Concepcion Living, LPN  I have completed the  exam and reviewed the above documentation for accuracy and completeness.  I agree with the above.  Haematologist has been used and any errors in dictation or transcription are unintentional.  Hervey Ard, M.D., F.A.C.S.  Forest Gleason Adonai Helzer 10/31/2018, 11:11 AM

## 2018-10-31 DIAGNOSIS — Z170 Estrogen receptor positive status [ER+]: Secondary | ICD-10-CM | POA: Insufficient documentation

## 2018-10-31 DIAGNOSIS — C50411 Malignant neoplasm of upper-outer quadrant of right female breast: Secondary | ICD-10-CM | POA: Insufficient documentation

## 2018-11-01 NOTE — Progress Notes (Signed)
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  INTERVAL HISTORY: Patient is an 85 year old female who previously noted an abnormality on mammogram in her right breast.  This was followed for some time, but then recently biopsied to reveal the above-stated malignancy.  She currently feels well and is asymptomatic.  She has no neurologic complaints.  She denies any recent fevers or illnesses.  She has a good appetite and denies weight loss.  She has no chest pain, shortness of breath, cough, or hemoptysis.  She denies any nausea, vomiting, constipation, or diarrhea.  She has no urinary complaints.  Patient feels at her baseline offers no specific complaints today.  REVIEW OF SYSTEMS:   Review of Systems  Constitutional: Negative.  Negative for fever, malaise/fatigue and weight loss.  Respiratory: Negative.  Negative for cough, hemoptysis and shortness of breath.   Cardiovascular: Negative.  Negative for chest pain and leg swelling.  Gastrointestinal: Negative.  Negative for abdominal pain.  Genitourinary: Negative.  Negative for dysuria.  Musculoskeletal: Negative.  Negative for back pain.  Skin: Negative.  Negative for rash.  Neurological: Negative.  Negative for dizziness, focal weakness, weakness and headaches.  Psychiatric/Behavioral: Negative.  The patient is not nervous/anxious.     As per HPI. Otherwise, a complete review of systems is negative.  PAST MEDICAL HISTORY: Past Medical History:  Diagnosis Date   Complete heart block ([place])    Gait abnormality 07/23/2016   History of hypertension    History of stroke     Hyperhomocysteinemia ([facility])    Hyperlipidemia    Hypertension    Hypokalemia 07/13/2018   Low blood magnesium 07/13/2018   Pacemaker-Medtronic 08/07/2011   Vomiting 07/13/2018    PAST SURGICAL HISTORY: Past Surgical History:  Procedure Laterality Date   BIOPSY  07/15/2018   Procedure: BIOPSY;  Surgeon: [name], MD;  Location: [facility];  Service: Gastroenterology;;   BREAST BIOPSY Right 10/27/2018   affirm bx of mass, x clip,  INVASIVE MAMMARY CARCINOMA   CARDIAC CATHETERIZATION     ESOPHAGOGASTRODUODENOSCOPY Left 07/15/2018   Procedure: ESOPHAGOGASTRODUODENOSCOPY (EGD);  Surgeon: [name], MD;  Location: [facility];  Service: Gastroenterology;  Laterality: Left;   PACEMAKER INSERTION     Medtronic Enpulse dual-chamber pacemaker   PERMANENT PACEMAKER GENERATOR CHANGE N/A 07/30/2013   Procedure: PERMANENT PACEMAKER GENERATOR CHANGE;  Surgeon: [name], MD;  Location: [facility] CATH LAB;  Service: Cardiovascular;  Laterality: N/A;    FAMILY HISTORY: Family History  Problem Relation Age of Onset   Hypertension Mother    Colon cancer Neg Hx    Esophageal cancer Neg Hx     ADVANCED DIRECTIVES (Y/N):  N  HEALTH MAINTENANCE: Social History   Tobacco Use   Smoking status: Former Smoker    Quit date: 07/25/2006    Years since quitting: 12.2   Smokeless tobacco: Never Used  Substance Use Topics   Alcohol use: No   Drug use: No     Colonoscopy:  PAP:  Bone density:  Lipid panel:  No Known Allergies  Current Outpatient Medications  Medication Sig Dispense Refill   acetaminophen (TYLENOL) 500 MG tablet Take 500-1,000 mg by mouth every  6 (six) hours as needed for moderate pain.      amLODipine (NORVASC) 5 MG tablet Take 5 mg by mouth daily.     aspirin 81 MG tablet Take 81 mg by mouth daily.     Blood Pressure KIT Automated blood pressure measuring device. 1 each 0   clopidogrel (PLAVIX) 75 MG tablet Take 75 mg by mouth daily with  breakfast.     hydrochlorothiazide (HYDRODIURIL) 25 MG tablet Take 25 mg by mouth daily.     LUMIGAN 0.01 % SOLN Place 1 drop into both eyes at bedtime.  4   mirtazapine (REMERON) 7.5 MG tablet TAKE 1 TABLET BY MOUTH EVERYDAY AT BEDTIME     pantoprazole (PROTONIX) 40 MG tablet Take 40 mg by mouth daily.     simvastatin (ZOCOR) 40 MG tablet Take 1 tablet (40 mg total) by mouth at bedtime. Please make overdue appt with Dr. Caryl Comes before anymore refills. 1st attempt 30 tablet 0   ondansetron (ZOFRAN) 4 MG tablet Take 4 mg by mouth every 8 (eight) hours as needed for nausea or vomiting.      No current facility-administered medications for this visit.     OBJECTIVE: Vitals:   11/03/18 1344  BP: (!) 152/99  Pulse: 62  Temp: (!) 96.6 F (35.9 C)     Body mass index is 32.23 kg/m.    ECOG FS:0 - Asymptomatic  General: Well-developed, well-nourished, no acute distress. Eyes: Pink conjunctiva, anicteric sclera. HEENT: Normocephalic, moist mucous membranes, clear oropharnyx. Breast: Patient requested exam be deferred today. Lungs: Clear to auscultation bilaterally. Heart: Regular rate and rhythm. No rubs, murmurs, or gallops. Abdomen: Soft, nontender, nondistended. No organomegaly noted, normoactive bowel sounds. Musculoskeletal: No edema, cyanosis, or clubbing. Neuro: Alert, answering all questions appropriately. Cranial nerves grossly intact. Skin: No rashes or petechiae noted. Psych: Normal affect. Lymphatics: No cervical, calvicular, axillary or inguinal LAD.   LAB RESULTS:  Lab Results  Component Value Date   NA 136 07/24/2018   K 3.7 07/24/2018   CL 109 07/24/2018   CO2 20 (L) 07/24/2018   GLUCOSE 91 07/24/2018   BUN 6 (L) 07/24/2018   CREATININE 0.82 07/24/2018   CALCIUM 7.6 (L) 07/24/2018   PROT 4.9 (L) 07/24/2018   ALBUMIN 1.9 (L) 07/24/2018   AST 14 (L) 07/24/2018   ALT 9 07/24/2018   ALKPHOS 52 07/24/2018   BILITOT 0.7 07/24/2018   GFRNONAA >60 07/24/2018    GFRAA >60 07/24/2018    Lab Results  Component Value Date   WBC 4.5 07/24/2018   NEUTROABS 6.6 07/13/2018   HGB 9.3 (L) 07/24/2018   HCT 31.4 (L) 07/24/2018   MCV 91.0 07/24/2018   PLT 235 07/24/2018     STUDIES: Mm Clip Placement Right  Result Date: 10/27/2018 CLINICAL DATA:  Patient is post stereotactic core needle biopsy of a 5 mm spiculated mass over the upper outer quadrant of the right breast. EXAM: DIAGNOSTIC right MAMMOGRAM POST stereotactic BIOPSY COMPARISON:  Previous exam(s). FINDINGS: Mammographic images were obtained following stereotactic guided biopsy of the targeted mass over the upper outer quadrant. Images demonstrates satisfactory placement of an X shaped metallic clip over the biopsied mass on the CC image as the clip lies approximately 1 cm inferior to the targeted mass on the ML image. IMPRESSION: Clip placement as described post stereotactic core needle biopsy right breast mass. Final Assessment: Post Procedure Mammograms for Marker Placement Electronically Signed   By: Marin Olp M.D.   On: 10/27/2018 09:41  Mm Rt Breast Bx W Loc Dev 1st Lesion Image Bx Spec Stereo Guide  Addendum Date: 10/30/2018   ADDENDUM REPORT: 10/30/2018 10:10 ADDENDUM: PATHOLOGY: BREAST, RIGHT UPPER OUTER QUADRANT; STEREOTACTIC BIOPSY: INVASIVE MAMMARY CARCINOMA, NO SPECIAL TYPE. Size of invasive carcinoma: 4 mm in this sample; carcinoma in 4 of 5 blocks Histologic grade of invasive carcinoma: Grade 1. Ductal carcinoma in situ: Present, cribriform type, low grade Lymphovascular invasion: Not identified. CONCORDANT: YES per Dr. Marin Olp. I telephoned the patient and granddaughter, Maleeyah Mccaughey on 10/29/2018 at 1135 and discussed these results and the recommendations stated below. All questions were answered. The patient denies significant pain or bleeding from the biopsy site. Biopsy site care instructions were reviewed and the patient was asked to call [facility] with any  questions or issues related to the biopsy. RECOMMENDATION: Surgical and oncology referrals. Request for referrals was relayed to [place], breast navigator for [facility] by Electa Sniff RN on 10/29/2018. An appointment was made with Dr. Ollen Bowl for 10/30/2018 and Dr. Grayland Ormond for 11/03/2018. The patient was notified of appointments by Al Pimple RN. Electa Sniff RN 10/30/2018. Electronically Signed   By: Marin Olp M.D.   On: 10/30/2018 10:10   Result Date: 10/30/2018 CLINICAL DATA:  Patient presents for stereotactic core needle biopsy of a 5 mm spiculated mass over the upper outer quadrant of the right breast. EXAM: RIGHT BREAST STEREOTACTIC CORE NEEDLE BIOPSY COMPARISON:  Previous exams. FINDINGS: The patient and I discussed the procedure of stereotactic-guided biopsy including benefits and alternatives. We discussed the high likelihood of a successful procedure. We discussed the risks of the procedure including infection, bleeding, tissue injury, clip migration, and inadequate sampling. Informed written consent was given. The usual time out protocol was performed immediately prior to the procedure. Using sterile technique and 1% Lidocaine as local anesthetic, under stereotactic guidance, a 9 gauge vacuum assisted device was used to perform core needle biopsy of the targeted 5 mm spiculated mass over the upper outer quadrant using a superior to inferior approach. Six core tissue specimens were obtained. Lesion quadrant: Right upper outer quadrant. At the conclusion of the procedure, a X shaped tissue marker clip was deployed into the biopsy cavity. Follow-up 2-view mammogram was performed and dictated separately. IMPRESSION: Stereotactic-guided biopsy of 5 mm spiculated mass right upper outer quadrant. No apparent complications. Electronically Signed: By: Marin Olp M.D. On: 10/27/2018 09:26    ASSESSMENT: Clinical stage Ia ER/PR positive, HER-2 negative invasive carcinoma of  the upper outer quadrant of the right breast.  PLAN:    1. 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  I recommended patient proceed with lumpectomy and sentinel node biopsy.  This will likely be followed by adjuvant XRT.  Given her advanced age and small size of malignancy, I have not recommended chemotherapy at this time.  She will benefit from 5 years of aromatase inhibitor at the conclusion of her XRT.  Patient will return to clinic 1 to 2 weeks after her surgery to discuss her final pathology results as well as her consultation with radiation oncology.  I spent a total of 60 minutes face-to-face with the patient of which greater than 50% of the visit was spent in counseling and coordination of care as detailed above.   Patient expressed understanding and was in agreement with this plan. She also understands that  She can call clinic at any time with any questions, concerns, or complaints.   Cancer Staging Malignant neoplasm of upper-outer quadrant of right breast in female, estrogen receptor positive ([place]) Staging form: Breast, AJCC 8th Edition - Clinical stage from 11/06/2018: Stage IA (cT1a, cN0, cM0, G1, ER+, PR+, HER2-) - Signed by [name], MD on 11/06/2018   [name], MD   11/06/2018 7:00 AM

## 2018-11-03 ENCOUNTER — Encounter: Payer: Self-pay | Admitting: Oncology

## 2018-11-03 ENCOUNTER — Other Ambulatory Visit: Payer: Self-pay

## 2018-11-03 ENCOUNTER — Inpatient Hospital Stay: Payer: Medicare Other | Attending: Oncology | Admitting: Oncology

## 2018-11-03 ENCOUNTER — Other Ambulatory Visit: Payer: Self-pay | Admitting: Pathology

## 2018-11-03 VITALS — BP 152/99 | HR 62 | Temp 96.6°F | Ht 62.0 in | Wt 176.2 lb

## 2018-11-03 DIAGNOSIS — Z170 Estrogen receptor positive status [ER+]: Secondary | ICD-10-CM | POA: Insufficient documentation

## 2018-11-03 DIAGNOSIS — Z8673 Personal history of transient ischemic attack (TIA), and cerebral infarction without residual deficits: Secondary | ICD-10-CM

## 2018-11-03 DIAGNOSIS — Z87891 Personal history of nicotine dependence: Secondary | ICD-10-CM | POA: Insufficient documentation

## 2018-11-03 DIAGNOSIS — Z79899 Other long term (current) drug therapy: Secondary | ICD-10-CM | POA: Diagnosis not present

## 2018-11-03 DIAGNOSIS — C50411 Malignant neoplasm of upper-outer quadrant of right female breast: Secondary | ICD-10-CM | POA: Diagnosis present

## 2018-11-03 DIAGNOSIS — E785 Hyperlipidemia, unspecified: Secondary | ICD-10-CM | POA: Diagnosis not present

## 2018-11-03 DIAGNOSIS — I10 Essential (primary) hypertension: Secondary | ICD-10-CM | POA: Insufficient documentation

## 2018-11-03 DIAGNOSIS — Z7982 Long term (current) use of aspirin: Secondary | ICD-10-CM | POA: Diagnosis not present

## 2018-11-03 LAB — SURGICAL PATHOLOGY

## 2018-11-03 NOTE — Progress Notes (Signed)
Patient is here today to establish care for her invasive carcinoma of her right breast. Patient's biopsy was done on 10/27/2018.

## 2018-11-04 NOTE — Progress Notes (Signed)
Reported receptor results to patient, and niece per Dr. Grayland Ormond request.  Reinforced rationale for aromatase inhibitor as discussed in consult visit.

## 2018-11-04 NOTE — Progress Notes (Addendum)
  Oncology Nurse Navigator Documentation  Navigator Location: [facility]Med [facility]c (11/03/18 1600)   )Navigator Encounter Type: Clinic/MDC (11/03/18 1600)   Abnormal Finding Date: 11/03/18 (11/03/18 1600)                 Patient Visit Type: Initial;MedOnc (11/03/18 1600)   Barriers/Navigation Needs: Coordination of Care;Education;Family Concerns (11/03/18 1600) Education: Accessing Care/ Finding Providers;Coping with Diagnosis/ Prognosis;Newly Diagnosed Cancer Education (11/03/18 1600)     Coordination of Care: Appts (11/03/18 1600)                  Time Spent with Patient: 60 (11/03/18 1600)   Supported patient at initial Med/Onc visit with Dr. Grayland Ormond.  Patients sister Vivien Rota, and niece [name] are connected to visit through phone call.  Discussed  treatment plan of surgery, radiation, and Aromatase inhibitor dependent on receptor results, and conversations with Surgeon, and Radiation Oncologist.  Patient will return for follow-up discussion after surgery.  Given Breast Cancer Treatment Handbook/folder wit hospital services, after reviewing with sister.

## 2018-11-05 ENCOUNTER — Telehealth: Payer: Self-pay | Admitting: *Deleted

## 2018-11-05 NOTE — Telephone Encounter (Signed)
Patient's niece, [name], states that Dr. Bary Castilla was supposed to call them to follow up no later than yesterday but they have not heard anything yet.   Message routed to Dr. Bary Castilla.

## 2018-11-06 ENCOUNTER — Other Ambulatory Visit: Payer: Self-pay | Admitting: *Deleted

## 2018-11-06 DIAGNOSIS — C50411 Malignant neoplasm of upper-outer quadrant of right female breast: Secondary | ICD-10-CM

## 2018-11-06 DIAGNOSIS — Z170 Estrogen receptor positive status [ER+]: Secondary | ICD-10-CM

## 2018-11-06 NOTE — Progress Notes (Signed)
Referral to Dr. Baruch Gouty per Dr. Bary Castilla.  Message left for Dr. Olena Leatherwood nurse to call patient or her niece, [name] with an appointment.

## 2018-11-06 NOTE — Telephone Encounter (Signed)
The patient's niece who accompanied her for her office visit last month was contacted regarding options for management of [name].  I spoke with Noreene Filbert, MD from radiation oncology about the possibility of primary treatment with radiation in place of wide excision.  This would be an off protocol policy, but the patient's health is better in person than paper, but if she were a candidate for post surgery radiation it would be a reasonable visit for him to see her ahead of time.  The knees and her aunt met with Dr. Grayland Ormond from medical oncology earlier in the week who suggested standard vision, radiation and that antiestrogen therapy.  We will arrange for the Ms. [name] to meet with Dr. Baruch Gouty for his assessment and go from there.

## 2018-11-11 ENCOUNTER — Other Ambulatory Visit: Payer: Self-pay

## 2018-11-12 ENCOUNTER — Encounter: Payer: Self-pay | Admitting: Radiation Oncology

## 2018-11-12 ENCOUNTER — Ambulatory Visit
Admission: RE | Admit: 2018-11-12 | Discharge: 2018-11-12 | Disposition: A | Discharge status: Home / Self Care | Payer: Medicare Other | Source: Ambulatory Visit | Attending: Radiation Oncology | Admitting: Radiation Oncology

## 2018-11-12 ENCOUNTER — Other Ambulatory Visit: Payer: Self-pay

## 2018-11-12 ENCOUNTER — Telehealth: Payer: Self-pay | Admitting: General Surgery

## 2018-11-12 VITALS — BP 179/91 | HR 73 | Temp 97.9°F | Resp 18 | Wt 174.2 lb

## 2018-11-12 DIAGNOSIS — Z79899 Other long term (current) drug therapy: Secondary | ICD-10-CM | POA: Insufficient documentation

## 2018-11-12 DIAGNOSIS — Z8673 Personal history of transient ischemic attack (TIA), and cerebral infarction without residual deficits: Secondary | ICD-10-CM | POA: Insufficient documentation

## 2018-11-12 DIAGNOSIS — Z7982 Long term (current) use of aspirin: Secondary | ICD-10-CM | POA: Diagnosis not present

## 2018-11-12 DIAGNOSIS — E7211 Homocystinuria: Secondary | ICD-10-CM | POA: Diagnosis not present

## 2018-11-12 DIAGNOSIS — E876 Hypokalemia: Secondary | ICD-10-CM | POA: Insufficient documentation

## 2018-11-12 DIAGNOSIS — I10 Essential (primary) hypertension: Secondary | ICD-10-CM | POA: Insufficient documentation

## 2018-11-12 DIAGNOSIS — E785 Hyperlipidemia, unspecified: Secondary | ICD-10-CM | POA: Diagnosis not present

## 2018-11-12 DIAGNOSIS — Z170 Estrogen receptor positive status [ER+]: Secondary | ICD-10-CM | POA: Insufficient documentation

## 2018-11-12 DIAGNOSIS — Z87891 Personal history of nicotine dependence: Secondary | ICD-10-CM | POA: Diagnosis not present

## 2018-11-12 DIAGNOSIS — C50411 Malignant neoplasm of upper-outer quadrant of right female breast: Secondary | ICD-10-CM | POA: Insufficient documentation

## 2018-11-12 DIAGNOSIS — I442 Atrioventricular block, complete: Secondary | ICD-10-CM | POA: Insufficient documentation

## 2018-11-12 NOTE — Telephone Encounter (Signed)
[name] is calling about the patient she did not want to go through radiation  and would like to go through with having surgery, does the patient need to come in for another visit or can she be setup for the surgery. Please advise.

## 2018-11-12 NOTE — Consult Note (Signed)
NEW PATIENT EVALUATION  Name: [name]  MRN: [id number]  Date:   11/12/2018     DOB: Aug 24, 1933   This 85 y.o. female patient presents to the clinic for initial evaluation of stage Ia ER PR positive HER-2 negative invasive mammary carcinoma of the upper outer quadrant of the right breast prior to any definitive treatment.  REFERRING PHYSICIAN: Robert Bellow, MD  CHIEF COMPLAINT:  Chief Complaint  Patient presents with  . Breast Cancer    Initial consultation    DIAGNOSIS: The encounter diagnosis was Malignant neoplasm of upper-outer quadrant of right breast in female, estrogen receptor positive ([name]).   PREVIOUS INVESTIGATIONS:  Pathology report reviewed Mammogram and ultrasound reviewed Clinical notes reviewed  HPI: Patient is an 85 year old female originally presented with abnormal mammogram of her upper outer quadrant of the right breast.  Stereotactic guided biopsy was positive for ER PR positive HER-2 negative invasive mammary carcinoma.  Original mammogram showed a small small spiculated mass in the upper outer quadrant concerning for malignancy.  She has been seen by Dr. Tollie Pizza and is now referred presurgery to radiation oncology for possible treatment without surgical resection.  She is doing well she is asymptomatic.  She specifically denies breast tenderness cough or bone pain.  PLANNED TREATMENT REGIMEN: Surgical resection  PAST MEDICAL HISTORY:  has a past medical history of Complete heart block ([place]), Gait abnormality (07/23/2016), History of hypertension, History of stroke, Hyperhomocysteinemia ([place]), Hyperlipidemia, Hypertension, Hypokalemia (07/13/2018), Low blood magnesium (07/13/2018), Pacemaker-Medtronic (08/07/2011), and Vomiting (07/13/2018).    PAST SURGICAL HISTORY:  Past Surgical History:  Procedure Laterality Date  . BIOPSY  07/15/2018   Procedure: BIOPSY;  Surgeon: [name], MD;  Location: [facility];  Service: Gastroenterology;;  .  BREAST BIOPSY Right 10/27/2018   affirm bx of mass, x clip,  INVASIVE MAMMARY CARCINOMA  . CARDIAC CATHETERIZATION    . ESOPHAGOGASTRODUODENOSCOPY Left 07/15/2018   Procedure: ESOPHAGOGASTRODUODENOSCOPY (EGD);  Surgeon: [name], MD;  Location: [facility];  Service: Gastroenterology;  Laterality: Left;  . PACEMAKER INSERTION     Medtronic Enpulse dual-chamber pacemaker  . PERMANENT PACEMAKER GENERATOR CHANGE N/A 07/30/2013   Procedure: PERMANENT PACEMAKER GENERATOR CHANGE;  Surgeon: [name], MD;  Location: [facility] CATH LAB;  Service: Cardiovascular;  Laterality: N/A;    FAMILY HISTORY: family history includes Hypertension in her mother.  SOCIAL HISTORY:  reports that she quit smoking about 12 years ago. She has never used smokeless tobacco. She reports that she does not drink alcohol or use drugs.  ALLERGIES: Patient has no known allergies.  MEDICATIONS:  Current Outpatient Medications  Medication Sig Dispense Refill  . acetaminophen (TYLENOL) 500 MG tablet Take 500-1,000 mg by mouth every 6 (six) hours as needed for moderate pain.     [name] amLODipine (NORVASC) 5 MG tablet Take 5 mg by mouth daily.    [name] aspirin 81 MG tablet Take 81 mg by mouth daily.    . Blood Pressure KIT Automated blood pressure measuring device. 1 each 0  . clopidogrel (PLAVIX) 75 MG tablet Take 75 mg by mouth daily with breakfast.    . hydrochlorothiazide (HYDRODIURIL) 25 MG tablet Take 25 mg by mouth daily.    [name] LUMIGAN 0.01 % SOLN Place 1 drop into both eyes at bedtime.  4  . mirtazapine (REMERON) 7.5 MG tablet TAKE 1 TABLET BY MOUTH EVERYDAY AT BEDTIME    . ondansetron (ZOFRAN) 4 MG tablet Take 4 mg by mouth every 8 (eight) hours  as needed for nausea or vomiting.     . pantoprazole (PROTONIX) 40 MG tablet Take 40 mg by mouth daily.    . simvastatin (ZOCOR) 40 MG tablet Take 1 tablet (40 mg total) by mouth at bedtime. Please make overdue appt with Dr. Caryl Comes before anymore refills. 1st attempt 30 tablet 0    No current facility-administered medications for this encounter.     ECOG PERFORMANCE STATUS:  0 - Asymptomatic  REVIEW OF SYSTEMS: Patient denies any weight loss, fatigue, weakness, fever, chills or night sweats. Patient denies any loss of vision, blurred vision. Patient denies any ringing  of the ears or hearing loss. No irregular heartbeat. Patient denies heart murmur or history of fainting. Patient denies any chest pain or pain radiating to her upper extremities. Patient denies any shortness of breath, difficulty breathing at night, cough or hemoptysis. Patient denies any swelling in the lower legs. Patient denies any nausea vomiting, vomiting of blood, or coffee ground material in the vomitus. Patient denies any stomach pain. Patient states has had normal bowel movements no significant constipation or diarrhea. Patient denies any dysuria, hematuria or significant nocturia. Patient denies any problems walking, swelling in the joints or loss of balance. Patient denies any skin changes, loss of hair or loss of weight. Patient denies any excessive worrying or anxiety or significant depression. Patient denies any problems with insomnia. Patient denies excessive thirst, polyuria, polydipsia. Patient denies any swollen glands, patient denies easy bruising or easy bleeding. Patient denies any recent infections, allergies or URI. Patient "s visual fields have not changed significantly in recent time.   PHYSICAL EXAM: BP (!) 179/91   Pulse 73   Temp 97.9 F (36.6 C) (Tympanic)   Resp 18   Wt 174 lb 2.6 oz (79 kg)   BMI 31.85 kg/m  On breast examination no dominant mass or nodularity is noted in either breast in 2 positions examined.  No axillary or supraclavicular adenopathy is identified.  Well-developed well-nourished patient in NAD. HEENT reveals PERLA, EOMI, discs not visualized.  Oral cavity is clear. No oral mucosal lesions are identified. Neck is clear without evidence of cervical or  supraclavicular adenopathy. Lungs are clear to A&P. Cardiac examination is essentially unremarkable with regular rate and rhythm without murmur rub or thrill. Abdomen is benign with no organomegaly or masses noted. Motor sensory and DTR levels are equal and symmetric in the upper and lower extremities. Cranial nerves II through XII are grossly intact. Proprioception is intact. No peripheral adenopathy or edema is identified. No motor or sensory levels are noted. Crude visual fields are within normal range.  LABORATORY DATA: Pathology report reviewed    RADIOLOGY RESULTS: Mammogram and ultrasound reviewed   IMPRESSION: Stage I invasive mammary carcinoma of the right breast ER PR positive HER-2 negative in 85 year old female  PLAN: At this time of talk to Dr. Tollie Pizza he believes he can do a surgical wide local excision.  Should she have clear margins would forego any adjuvant radiation therapy.  If patient is found not to be operable or refuses surgery would consider a hypofractionated course of partial breast radiation use external beam treatment.  All of this was discussed with the patient.  Nurse navigator was present throughout our discussion.  Patient will be referred back to Dr. Wilfred Lacy for possible surgery.  I would like to take this opportunity to thank you for allowing me to participate in the care of your patient.Noreene Filbert, MD

## 2018-11-21 ENCOUNTER — Ambulatory Visit: Payer: Self-pay | Admitting: General Surgery

## 2018-11-21 NOTE — H&P (Signed)
PATIENT PROFILE: [name] is a 85 y.o. female who presents to the Clinic for consultation at the request of [name] for evaluation of right breast cancer.  PCP:  Suzanne Register, MD  HISTORY OF PRESENT ILLNESS: [name] reports she had a mammogram that shows distortion on the right breast.  This led to a diagnostic mammogram and a core needle biopsy.  Core needle biopsy shows a 4 mm invasive mammary carcinoma on the upper outer quadrant of the right breast.  There was DCIS associated to the mass.  There was no suspicious lymph node in the axilla.  Patient denies any breast pain.  Patient denies any skin changes.  Patient denies nipple retraction or nipple discharge.  Family history of breast cancer: Denies Family history of other cancers: Denies Menarche: Unknown Menopause: Unknown Used OCP: Denies Used estrogen and progesterone therapy: Denies History of Radiation to the chest: None No previous breast biopsy  PROBLEM LIST: Hypertension Heart block Breast cancer History of stroke  GENERAL REVIEW OF SYSTEMS:   General ROS: negative for - chills, fatigue, fever, weight gain or weight loss Allergy and Immunology ROS: negative for - hives  Hematological and Lymphatic ROS: negative for - bleeding problems or bruising, negative for palpable nodes Endocrine ROS: negative for - heat or cold intolerance, hair changes Respiratory ROS: negative for - cough, shortness of breath or wheezing Cardiovascular ROS: no chest pain or palpitations GI ROS: negative for nausea, vomiting, abdominal pain, diarrhea, constipation Musculoskeletal ROS: negative for - joint swelling or muscle pain Neurological ROS: negative for - confusion, syncope Dermatological ROS: negative for pruritus and rash Psychiatric: negative for anxiety, depression, difficulty sleeping and memory loss  MEDICATIONS: CurrentMedications        Current Outpatient Medications  Medication Sig Dispense Refill   . amLODIPine (NORVASC) 5 MG tablet Take 5 mg by mouth once daily    . aspirin 81 MG EC tablet Take 81 mg by mouth once daily    . clopidogreL (PLAVIX) 75 mg tablet Take 75 mg by mouth once daily    . hydroCHLOROthiazide (HYDRODIURIL) 25 MG tablet Take 25 mg by mouth once daily    . mirtazapine (REMERON) 7.5 MG tablet Take 7.5 mg by mouth nightly    . pantoprazole (PROTONIX) 40 MG Suzanne tablet Take 40 mg by mouth once daily    . simvastatin (ZOCOR) 40 MG tablet Take 40 mg by mouth nightly     No current facility-administered medications for this visit.       ALLERGIES: Patient has no known allergies.  PAST MEDICAL HISTORY:     Past Medical History:  Diagnosis Date  . 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 History of stroke   . Hyperlipidemia   . Hypertension     PAST SURGICAL HISTORY:      Past Surgical History:  Procedure Laterality Date  . INSERTION DUAL CHAMBER PACEMAKER GENERATOR  2006/2013   [name]     FAMILY HISTORY:      Family History  Problem Relation Age of Onset  . High blood pressure (Hypertension) Mother   . Diabetes Daughter      SOCIAL HISTORY: Social History          Socioeconomic History  . Marital status: Single    Spouse name: Not on file  . Number of children: Not on file  . Years of education: Not on file  . Highest education level: Not on file  Occupational History  .  Not on file  Social Needs  . Financial resource strain: Not on file  . Food insecurity:    Worry: Not on file    Inability: Not on file  . Transportation needs:    Medical: Not on file    Non-medical: Not on file  Tobacco Use  . Smoking status: Former Smoker    Years: 50.00    Last attempt to quit: 2007    Years since quitting: 13.5  . Smokeless tobacco: Never Used  Substance and Sexual Activity  . Alcohol use: Not Currently  . Drug use: Not on file  . Sexual activity: Not on file  Other Topics Concern  . Not on  file  Social History Narrative  . Not on file      PHYSICAL EXAM:    Vitals:   11/21/18 0925  BP: 152/89  Pulse: 64   Body mass index is 32.37 kg/m. Weight: 80.3 kg (177 lb)   GENERAL: Alert, active, oriented x3  HEENT: Pupils equal reactive to light. Extraocular movements are intact. Sclera clear. Palpebral conjunctiva normal red color.Pharynx clear.  NECK: Supple with no palpable mass and no adenopathy.  LUNGS: Sound clear with no rales rhonchi or wheezes.  HEART: Regular rhythm S1 and S2 without murmur.  BREAST: breasts appear normal, no suspicious masses, no skin or nipple changes or axillary nodes.  ABDOMEN: Soft and depressible, nontender with no palpable mass, no hepatomegaly.  EXTREMITIES: Well-developed well-nourished symmetrical with no dependent edema.  NEUROLOGICAL: Awake alert oriented, facial expression symmetrical, moving all extremities.  REVIEW OF DATA: I have reviewed the following data today: No results found for any previous visit.  I personally evaluated the images of the diagnostic mammogram and post biopsy clip placement.  Clip in place.  I also evaluated personally the biopsy report showing invasive mammary carcinoma.  No new labs in the last 6 months.  ASSESSMENT: [name] is a 85 y.o. female presenting for consultation for right breast cancer.    Patient and her niece where oriented again about the pathology results. Surgical alternatives were discussed with patient including partial vs total mastectomy. Surgical technique and post operative care was discussed with patient. Risk of surgery was discussed with patient including but not limited to: wound infection, seroma, hematoma, brachial plexopathy, mondor's disease (thrombosis of small veins of breast), chronic wound pain, breast lymphedema, altered sensation to the nipple and cosmesis among others.   Due to her patient history of heart block and stroke she is at  increased risk of complications especially from anesthesia.  Patient is using Plavix and aspirin.  Have cardiac clearance and recommendation regarding holding Plavix and aspirin 7 days before surgery.  This was discussed with the patient that the increased risk of recurrent stroke or any heart complication.  Patient was previously evaluated by Suzanne. Bary Castilla and recommend evaluation by oncologist and radiation oncologist to discuss the benefit of surgical management.  Patient was evaluated by Suzanne. Grayland Ormond and Suzanne. Baruch Gouty and both recommended upfront surgery.  Patient will be reevaluated by oncology and radiation oncologist for discussion of benefit of radiation.  Patient will benefit of antiestrogen therapy after surgery.  Malignant neoplasm of upper-outer quadrant of right breast in female, estrogen receptor positive (CMS-HCC) [C50.411, Z17.0]  PLAN: 1.  Right needle guided partial mastectomy and sentinel node biopsy (19301, 38525) 2.  Stop Plavix 7 days before surgery and aspirin 5 days before surgery.  Will contact cardiology for further recommendation. 3.  Cardiology clearance 4.  CBC, CMP 5.  Contact us if has any question or concern  Patient and her niece verbalized understanding, all questions were answered, and were agreeable with the plan outlined above.     [name], MD  Electronically signed by [name], MD

## 2018-11-21 NOTE — H&P (View-Only) (Signed)
PATIENT PROFILE: [name] is a 85 y.o. female who presents to the Clinic for consultation at the request of Dr. Chauncy Passy for evaluation of right breast cancer.  PCP:  Lisabeth Register, MD  HISTORY OF PRESENT ILLNESS: [name] reports she had a mammogram that shows distortion on the right breast.  This led to a diagnostic mammogram and a core needle biopsy.  Core needle biopsy shows a 4 mm invasive mammary carcinoma on the upper outer quadrant of the right breast.  There was DCIS associated to the mass.  There was no suspicious lymph node in the axilla.  Patient denies any breast pain.  Patient denies any skin changes.  Patient denies nipple retraction or nipple discharge.  Family history of breast cancer: Denies Family history of other cancers: Denies Menarche: Unknown Menopause: Unknown Used OCP: Denies Used estrogen and progesterone therapy: Denies History of Radiation to the chest: None No previous breast biopsy  PROBLEM LIST: Hypertension Heart block Breast cancer History of stroke  GENERAL REVIEW OF SYSTEMS:   General ROS: negative for - chills, fatigue, fever, weight gain or weight loss Allergy and Immunology ROS: negative for - hives  Hematological and Lymphatic ROS: negative for - bleeding problems or bruising, negative for palpable nodes Endocrine ROS: negative for - heat or cold intolerance, hair changes Respiratory ROS: negative for - cough, shortness of breath or wheezing Cardiovascular ROS: no chest pain or palpitations GI ROS: negative for nausea, vomiting, abdominal pain, diarrhea, constipation Musculoskeletal ROS: negative for - joint swelling or muscle pain Neurological ROS: negative for - confusion, syncope Dermatological ROS: negative for pruritus and rash Psychiatric: negative for anxiety, depression, difficulty sleeping and memory loss  MEDICATIONS: CurrentMedications        Current Outpatient Medications  Medication Sig Dispense Refill   . amLODIPine (NORVASC) 5 MG tablet Take 5 mg by mouth once daily    . aspirin 81 MG EC tablet Take 81 mg by mouth once daily    . clopidogreL (PLAVIX) 75 mg tablet Take 75 mg by mouth once daily    . hydroCHLOROthiazide (HYDRODIURIL) 25 MG tablet Take 25 mg by mouth once daily    . mirtazapine (REMERON) 7.5 MG tablet Take 7.5 mg by mouth nightly    . pantoprazole (PROTONIX) 40 MG DR tablet Take 40 mg by mouth once daily    . simvastatin (ZOCOR) 40 MG tablet Take 40 mg by mouth nightly     No current facility-administered medications for this visit.       ALLERGIES: Patient has no known allergies.  PAST MEDICAL HISTORY:     Past Medical History:  Diagnosis Date  . 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 History of stroke   . Hyperlipidemia   . Hypertension     PAST SURGICAL HISTORY:      Past Surgical History:  Procedure Laterality Date  . INSERTION DUAL CHAMBER PACEMAKER GENERATOR  2006/2013   Dr Jolyn Nap     FAMILY HISTORY:      Family History  Problem Relation Age of Onset  . High blood pressure (Hypertension) Mother   . Diabetes Daughter      SOCIAL HISTORY: Social History          Socioeconomic History  . Marital status: Single    Spouse name: Not on file  . Number of children: Not on file  . Years of education: Not on file  . Highest education level: Not on file  Occupational History  .  Not on file  Social Needs  . Financial resource strain: Not on file  . Food insecurity:    Worry: Not on file    Inability: Not on file  . Transportation needs:    Medical: Not on file    Non-medical: Not on file  Tobacco Use  . Smoking status: Former Smoker    Years: 50.00    Last attempt to quit: 2007    Years since quitting: 13.5  . Smokeless tobacco: Never Used  Substance and Sexual Activity  . Alcohol use: Not Currently  . Drug use: Not on file  . Sexual activity: Not on file  Other Topics Concern  . Not on  file  Social History Narrative  . Not on file      PHYSICAL EXAM:    Vitals:   11/21/18 0925  BP: 152/89  Pulse: 64   Body mass index is 32.37 kg/m. Weight: 80.3 kg (177 lb)   GENERAL: Alert, active, oriented x3  HEENT: Pupils equal reactive to light. Extraocular movements are intact. Sclera clear. Palpebral conjunctiva normal red color.Pharynx clear.  NECK: Supple with no palpable mass and no adenopathy.  LUNGS: Sound clear with no rales rhonchi or wheezes.  HEART: Regular rhythm S1 and S2 without murmur.  BREAST: breasts appear normal, no suspicious masses, no skin or nipple changes or axillary nodes.  ABDOMEN: Soft and depressible, nontender with no palpable mass, no hepatomegaly.  EXTREMITIES: Well-developed well-nourished symmetrical with no dependent edema.  NEUROLOGICAL: Awake alert oriented, facial expression symmetrical, moving all extremities.  REVIEW OF DATA: I have reviewed the following data today: No results found for any previous visit.  I personally evaluated the images of the diagnostic mammogram and post biopsy clip placement.  Clip in place.  I also evaluated personally the biopsy report showing invasive mammary carcinoma.  No new labs in the last 6 months.  ASSESSMENT: [name] is a 85 y.o. female presenting for consultation for right breast cancer.    Patient and her niece where oriented again about the pathology results. Surgical alternatives were discussed with patient including partial vs total mastectomy. Surgical technique and post operative care was discussed with patient. Risk of surgery was discussed with patient including but not limited to: wound infection, seroma, hematoma, brachial plexopathy, mondor's disease (thrombosis of small veins of breast), chronic wound pain, breast lymphedema, altered sensation to the nipple and cosmesis among others.   Due to her patient history of heart block and stroke she is at  increased risk of complications especially from anesthesia.  Patient is using Plavix and aspirin.  Have cardiac clearance and recommendation regarding holding Plavix and aspirin 7 days before surgery.  This was discussed with the patient that the increased risk of recurrent stroke or any heart complication.  Patient was previously evaluated by Dr. Bary Castilla and recommend evaluation by oncologist and radiation oncologist to discuss the benefit of surgical management.  Patient was evaluated by Dr. Grayland Ormond and Dr. Baruch Gouty and both recommended upfront surgery.  Patient will be reevaluated by oncology and radiation oncologist for discussion of benefit of radiation.  Patient will benefit of antiestrogen therapy after surgery.  Malignant neoplasm of upper-outer quadrant of right breast in female, estrogen receptor positive (CMS-HCC) [C50.411, Z17.0]  PLAN: 1.  Right needle guided partial mastectomy and sentinel node biopsy (19301, 38525) 2.  Stop Plavix 7 days before surgery and aspirin 5 days before surgery.  Will contact cardiology for further recommendation. 3.  Cardiology clearance 4.  CBC, CMP 5.  Contact us if has any question or concern  Patient and her niece verbalized understanding, all questions were answered, and were agreeable with the plan outlined above.     [name], MD  Electronically signed by [name], MD

## 2018-11-24 ENCOUNTER — Other Ambulatory Visit: Payer: Self-pay | Admitting: General Surgery

## 2018-11-24 DIAGNOSIS — Z170 Estrogen receptor positive status [ER+]: Secondary | ICD-10-CM

## 2018-11-24 DIAGNOSIS — C50411 Malignant neoplasm of upper-outer quadrant of right female breast: Secondary | ICD-10-CM

## 2018-11-25 ENCOUNTER — Other Ambulatory Visit: Payer: Self-pay | Admitting: General Surgery

## 2018-11-25 ENCOUNTER — Telehealth: Payer: Self-pay

## 2018-11-25 DIAGNOSIS — Z170 Estrogen receptor positive status [ER+]: Secondary | ICD-10-CM

## 2018-11-25 DIAGNOSIS — C50411 Malignant neoplasm of upper-outer quadrant of right female breast: Secondary | ICD-10-CM

## 2018-11-25 NOTE — Telephone Encounter (Signed)
   [facility] Pre-operative Risk Assessment    Request for surgical clearance:  1. What type of surgery is being performed? Rt Partial Mastectomy   2. When is this surgery scheduled? 12/03/18  3. What type of clearance is required (medical clearance vs. Pharmacy clearance to hold med vs. Both)? Both  4. Are there any medications that need to be held prior to surgery and how long? Clopidogrel   5. Practice name and name of physician performing surgery? Dr. Jacquelyne Balint   6. What is your office phone number [phone]    7.   What is your office fax number [phone]  8.   Anesthesia type (None, local, MAC, general) ? General   [name] 11/25/2018, 1:55 PM  _________________________________________________________________   (provider comments below)

## 2018-11-25 NOTE — Telephone Encounter (Signed)
   Primary Cardiologist:Steven Caryl Comes, MD  Chart reviewed as part of pre-operative protocol coverage. Because of [name]'s past medical history and time since last visit, he/she will require a follow-up visit in order to better assess preoperative cardiovascular risk.  The patient was last seen over a year ago in 08/2017 by Dr. Caryl Comes and is overdue for both cardiology follow-up and out of date for her pacemaker transmission as well.   Pre-op covering staff: - Please schedule appointment and call patient to inform them - try to arrange ASAP - Please contact requesting surgeon's office to inform them of need for appointment prior to surgery. Please let them know that we do not prescribe her Plavix for cardiac reasons therefore they will need to reach out to the person that manages this medicine for her. I would suspect she is on this for history of stroke - [facility] indicates the last prescriber on this medicine was Charlott Holler who appears to practice with family medicine in [place].  Charlie Pitter, PA-C  11/25/2018, 2:16 PM

## 2018-11-25 NOTE — Telephone Encounter (Signed)
S/w sherry at Dr. Windell Moment office @ [phone]  Ext  Is aware need to send clearance to Dr. Chauncy Passy office about plavix and pt needs appt with Dr. Caryl Comes for upcoming surgery.  Also aware needs a recent device transmission.

## 2018-11-25 NOTE — Telephone Encounter (Signed)
Tried to call surgeon's office wrong number. S/w pt about upcoming surgery, pt gave me nieces number [phone] [name] on [place].  Stated surgeon is Dr. Windell Moment [phone].  Niece stated pt was up to date on device transmission.  Looking in chart last transmission missed.  Niece is aware.  Also stated Dr. Chauncy Passy gives pt plavix and not for cardiac issues. Niece stated would reach out to Dr. Garen Lah office.  Niece is aware pt will need appt with Dr. Caryl Comes before cleared for surgery, Niece stated [place] or  would be ok.  [place] staff message to try and get ov.

## 2018-11-28 ENCOUNTER — Other Ambulatory Visit: Payer: Self-pay

## 2018-11-28 ENCOUNTER — Telehealth: Payer: Self-pay

## 2018-11-28 ENCOUNTER — Encounter
Admission: RE | Admit: 2018-11-28 | Discharge: 2018-11-28 | Disposition: A | Discharge status: Home / Self Care | Payer: Medicare Other | Source: Ambulatory Visit | Attending: General Surgery | Admitting: General Surgery

## 2018-11-28 DIAGNOSIS — Z20828 Contact with and (suspected) exposure to other viral communicable diseases: Secondary | ICD-10-CM | POA: Diagnosis not present

## 2018-11-28 DIAGNOSIS — Z01818 Encounter for other preprocedural examination: Secondary | ICD-10-CM | POA: Insufficient documentation

## 2018-11-28 DIAGNOSIS — Z01810 Encounter for preprocedural cardiovascular examination: Secondary | ICD-10-CM

## 2018-11-28 HISTORY — DX: Gastro-esophageal reflux disease without esophagitis: K21.9

## 2018-11-28 HISTORY — DX: Cerebral infarction, unspecified: I63.9

## 2018-11-28 LAB — POTASSIUM: Potassium: 3.1 mmol/L — ABNORMAL LOW (ref 3.5–5.1)

## 2018-11-28 NOTE — Telephone Encounter (Signed)

## 2018-11-28 NOTE — Patient Instructions (Signed)
Your procedure is scheduled on: Wed. 8/5 Report to Radiology.at 7:45  Remember: Instructions that are not followed completely may result in serious medical risk,  up to and including death, or upon the discretion of your surgeon and anesthesiologist your  surgery may need to be rescheduled.     _X__ 1. Do not eat food after midnight the night before your procedure.                 No gum chewing or hard candies. You may drink clear liquids up to 2 hours                 before you are scheduled to arrive for your surgery- DO not drink clear                 liquids within 2 hours of the start of your surgery.                 Clear Liquids include:  water, apple juice without pulp, clear carbohydrate                 drink such as Clearfast of Gatorade, Black Coffee or Tea (Do not add                 anything to coffee or tea).  __X__2.  On the morning of surgery brush your teeth with toothpaste and water, you                may rinse your mouth with mouthwash if you wish.  Do not swallow any toothpaste of mouthwash.     ___ 3.  No Alcohol for 24 hours before or after surgery.   ___ 4.  Do Not Smoke or use e-cigarettes For 24 Hours Prior to Your Surgery.                 Do not use any chewable tobacco products for at least 6 hours prior to                 surgery.  ____  5.  Bring all medications with you on the day of surgery if instructed.   __x__  6.  Notify your doctor if there is any change in your medical condition      (cold, fever, infections).     Do not wear jewelry, make-up, hairpins, clips or nail polish. Do not wear lotions, powders, or perfumes. You may wear deodorant. Do not shave 48 hours prior to surgery. Men may shave face and neck. Do not bring valuables to the hospital.    [facility] is not responsible for any belongings or valuables.  Contacts, dentures or bridgework may not be worn into surgery. Leave your suitcase in the car. After  surgery it may be brought to your room. For patients admitted to the hospital, discharge time is determined by your treatment team.   Patients discharged the day of surgery will not be allowed to drive home.   Please read over the following fact sheets that you were given:     __x__ Take these medicines the morning of surgery with A SIP OF WATER:    1. amLODipine (NORVASC) 5 MG tablet  2. pantoprazole (PROTONIX) 40 MG tablet  Take dose the night before and one the morning of surgery  3.   4.  5.  6.  ____ Fleet Enema (as directed)   __x__ Use CHG Soap as directed  ____ Use inhalers  on the day of surgery  ____ Stop metformin 2 days prior to surgery    ____ Take 1/2 of usual insulin dose the night before surgery. No insulin the morning          of surgery.   __x__ Stopped Plavix on 7/28  Aspirin stopped today __x__ Stop Anti-inflammatories No advil ibuprofen aleve or aspirin.  May take tylenol   ____ Stop supplements until after surgery.    ____ Bring C-Pap to the hospital.

## 2018-11-29 DIAGNOSIS — C50911 Malignant neoplasm of unspecified site of right female breast: Secondary | ICD-10-CM

## 2018-11-29 HISTORY — DX: Malignant neoplasm of unspecified site of right female breast: C50.911

## 2018-11-29 LAB — SARS CORONAVIRUS 2 (TAT 6-24 HRS): SARS Coronavirus 2: NEGATIVE

## 2018-11-29 NOTE — Progress Notes (Signed)
Cardiology Office Note Date:  12/01/2018  Patient ID:  Asuna, Peth 04/07/1934, MRN [id number] PCP:  Philmore Pali, NP  Cardiologist/Electrophysiologist: Dr. Caryl Comes    Chief Complaint: surgical clearance, breast/lumpectomy  History of Present Illness: Derisha KENNA SEWARD is a 85 y.o. female with history of HTN, HLD, CVA, ATach, CHB w/PPM.  She comes today to be seen for Dr. Caryl Comes.   She last saw him may 2019.  He mentions she has chronic SOB (noted in older notes as well).  At that visit her tracking rate was decreased, otherwise no changes were made to her regime.  More recently May 31, 2018 she was discharged from hospitalization after she suffered a mechanical fall, unable to get off the floor for >24 hours resulting in AKF, rhabdomyolysis, also found with gram neg bacteremia Klebsiella pneumoniae, she had L sided weakness felt to have been recrudescence of the old stroke in the setting of rhabdomyolysis, she had abnormal but flat Trop felt to be demand in setting of rhabdo, AKI, she did have fluid OL in this setting, echo was done with preserved LVEF, no WMA.  Historically or since CHF has not been an issue for her  06/29/2018 re-hospiatilzed 2/2 severe hypokalemia 2/2 over diuresis Hospitalized again in March, discharged 3/26, this with intractable N/V, Enteritis due to Clostridium difficile.  This admission noted a renal mass for outpatient follow up  A nother in March w/enteritis and Cdiff  She has subsequently been found with R sided breast cancer, planned for Right needle guided partial mastectomy and sentinel node biopsy and need cardiology clearance.   Surgeon wants clearance to be off ASA, Plavix 7 days, she appears to be on 2/2 her stroke, not cardiac indication and knows the need for clearance for this from her neurologist or PMD.  She is accompanied by her sister.  The patient ambulates at baseline with a walker for stability, more so after her Feb hospitalization.  She  feels like her exertional capacity though is unchanged for years.  She ambulates with her walker inside/outside, she can walk flight of stairs.  She is able to do light housework though has a women that does it for her.  no CP, palpitations or cardiac awareness of any kind.  No SOB or DOE that she makes note of.  States that her limitations are orthopedic.  No dizzy spells, no near syncope or syncope.   RCRI risk score is 0.9% She has no new symptoms of any kind.  She walks with the aid of a walker that pre-dates her hospital stays this year.  She denies any kind of CP, palpitations or new symptoms of any kind   Device information MDT dual chamber PPM, CHB, implanted 2006, gen change 2015   Past Medical History:  Diagnosis Date  . Complete heart block ([place])   . Gait abnormality 07/23/2016  . GERD (gastroesophageal reflux disease)   . History of hypertension   . History of stroke   . Hyperhomocysteinemia ([place])   . Hyperlipidemia   . Hypertension   . Hypokalemia 07/13/2018  . Low blood magnesium 07/13/2018  . Pacemaker-Medtronic 08/07/2011  . Stroke [facility])    left sided weakness  . Vomiting 07/13/2018    Past Surgical History:  Procedure Laterality Date  . BIOPSY  07/15/2018   Procedure: BIOPSY;  Surgeon: [name], MD;  Location: [facility];  Service: Gastroenterology;;  . BREAST BIOPSY Right 10/27/2018   affirm bx of mass, x clip,  INVASIVE MAMMARY CARCINOMA  . CARDIAC CATHETERIZATION    . ESOPHAGOGASTRODUODENOSCOPY Left 07/15/2018   Procedure: ESOPHAGOGASTRODUODENOSCOPY (EGD);  Surgeon: [name], MD;  Location: [facility];  Service: Gastroenterology;  Laterality: Left;  . PACEMAKER INSERTION     Medtronic Enpulse dual-chamber pacemaker  . PERMANENT PACEMAKER GENERATOR CHANGE N/A 07/30/2013   Procedure: PERMANENT PACEMAKER GENERATOR CHANGE;  Surgeon: [name], MD;  Location: [facility] CATH LAB;  Service: Cardiovascular;  Laterality: N/A;    Current Outpatient  Medications  Medication Sig Dispense Refill  . acetaminophen (TYLENOL) 500 MG tablet Take 500-1,000 mg by mouth every 6 (six) hours as needed for moderate pain.     [name] amLODipine (NORVASC) 5 MG tablet Take 5 mg by mouth daily.    [name] aspirin 81 MG EC tablet Take 81 mg by mouth daily.     . Blood Pressure KIT Automated blood pressure measuring device. 1 each 0  . clopidogrel (PLAVIX) 75 MG tablet Take 75 mg by mouth daily with breakfast.    . hydrochlorothiazide (HYDRODIURIL) 25 MG tablet Take 25 mg by mouth daily.    [name] LUMIGAN 0.01 % SOLN Place 1 drop into both eyes at bedtime.  4  . mirtazapine (REMERON) 7.5 MG tablet Take 7.5 mg by mouth at bedtime.     . pantoprazole (PROTONIX) 40 MG tablet Take 40 mg by mouth daily.    . Potassium 99 MG TABS Take 99 mg by mouth daily.    . simvastatin (ZOCOR) 40 MG tablet Take 1 tablet (40 mg total) by mouth at bedtime. Please make overdue appt with Dr. Caryl Comes before anymore refills. 1st attempt (Patient taking differently: Take 40 mg by mouth at bedtime. ) 30 tablet 0   No current facility-administered medications for this visit.     Allergies:   Patient has no known allergies.   Social History:  The patient  reports that she quit smoking about 12 years ago. She has never used smokeless tobacco. She reports that she does not drink alcohol or use drugs.   Family History:  The patient's family history includes Hypertension in her mother.  ROS:  Please see the history of present illness.  All other systems are reviewed and otherwise negative.   PHYSICAL EXAM:  VS:  BP 124/76   Pulse 73   Ht 5' 2"  (1.575 m)   Wt 177 lb (80.3 kg)   BMI 32.37 kg/m  BMI: Body mass index is 32.37 kg/m. Well nourished, well developed, in no acute distress  HEENT: normocephalic, atraumatic  Neck: no JVD, carotid bruits or masses Cardiac: RRR; no significant murmurs, no rubs, or gallops Lungs:  CTA b/l, no wheezing, rhonchi or rales  Abd: soft, nontender MS: no  deformity or atrophy Ext: no edema  Skin: warm and dry, no rash Neuro:  No gross deficits appreciated Psych: euthymic mood, full affect  PPM site is stable, no tethering or discomfort   EKG:  Done today and reviewed by myself shows is SR/V paced PPM interrogation done today and reviewed by myself:  Battery is stable, RA lead measurements are stable, RV lead thresholds have waxed/waned, a slight trend upwards Autocapture this AM with threshold of 2.0V/0.36m, and V output at 4V/0.4164m manual threshold is 1.25V/0.64m55mhe has had AMS episodes  EGMs available for review are AT, no true AFib or flutter is appreciated, 17.1% In review, this does not appear new.  She had by graphic in feb what looks a clear uptick in this and  has settled again, again, no clear/EGM evidence of Afib is noted by her device.  I leaft her RV output at 4V today, she is pacer dependent    07/14/2018: TTE IMPRESSIONS  1. The left ventricle has normal systolic function with an ejection fraction of 60-65%. The cavity size was normal. Left ventricular diastolic Doppler parameters are indeterminate. No evidence of left ventricular regional wall motion abnormalities.  2. The right ventricle has normal systolic function. The cavity was normal. There is no increase in right ventricular wall thickness. Right ventricular systolic pressure is moderately elevated with an estimated pressure of 48.4 mmHg.  3. Mild thickening of the mitral valve leaflet.  4. The aortic valve is tricuspid Mild calcification of the aortic valve.  FINDINGS  Left Ventricle: The left ventricle has normal systolic function, with an ejection fraction of 60-65%. The cavity size was normal. There is no increase in left ventricular wall thickness. Left ventricular diastolic Doppler parameters are indeterminate.  No evidence of left ventricular regional wall motion abnormalities.. Right Ventricle: The right ventricle has normal systolic function. The cavity  was normal. There is no increase in right ventricular wall thickness. Right ventricular systolic pressure is moderately elevated with an estimated pressure of 48.4 mmHg. Pacing  wire/catheter visualized in the right ventricle. Pacing wire/catheter visualized in the right ventricle. Left Atrium: left atrial size was normal in size Right Atrium: right atrial size was normal in size. Right atrial pressure is estimated at 15 mmHg. Interatrial Septum: No atrial level shunt detected by color flow Doppler.  Pericardium: There is no evidence of pericardial effusion. Mitral Valve: The mitral valve is normal in structure. Mild thickening of the mitral valve leaflet. Mitral valve regurgitation is not visualized by color flow Doppler. Tricuspid Valve: The tricuspid valve is normal in structure. Tricuspid valve regurgitation is mild by color flow Doppler. Aortic Valve: The aortic valve was not well visualized. The aortic valve is tricuspid Mild calcification of the aortic valve. Aortic valve regurgitation was not visualized by color flow Doppler. Pulmonic Valve: The pulmonic valve was grossly normal. Pulmonic valve regurgitation is not visualized by color flow Doppler. No evidence of pulmonic stenosis. Pulmonary Artery: The pulmonary artery is not well seen. Venous: The inferior vena cava was not well visualized. The inferior vena cava is normal in size with greater than 50% respiratory variability.   LEFT VENTRICLE PLAX 2D LVIDd:         4.53 cm  Diastology LVIDs:         3.06 cm  LV e' lateral:   10.00 cm/s LV PW:         0.91 cm  LV E/e' lateral: 9.0 LV IVS:        0.88 cm  LV e' medial:    7.51 cm/s LVOT diam:     2.40 cm  LV E/e' medial:  12.0 LV SV:         57 ml LV SV Index:   28.86 LVOT Area:     4.52 cm  RIGHT VENTRICLE RV S prime:     14.60 cm/s TAPSE (M-mode): 2.4 cm RVSP:           48.4 mmHg  LEFT ATRIUM           Index       RIGHT ATRIUM           Index LA diam:      3.70 cm 1.98  cm/m  RA Pressure: 15 mmHg LA Vol (A2C):  32.1 ml 17.16 ml/m RA Area:     12.50 cm LA Vol (A4C): 41.1 ml 21.97 ml/m RA Volume:   27.10 ml  14.49 ml/m    AORTA Ao Root diam: 3.00 cm  MITRAL VALVE              TRICUSPID VALVE MV Area (PHT): 3.99 cm   TR Peak grad:   33.4 mmHg MV PHT:        55.10 msec TR Vmax:        289.00 cm/s MV Decel Time: 190 msec   RVSP:           48.4 mmHg MV E velocity: 90.30 cm/s MV A velocity: 95.20 cm/s SHUNTS MV E/A ratio:  0.95       Systemic Diam: 2.40 cm  Recent Labs: 07/13/2018: TSH 0.716 07/14/2018: B Natriuretic Peptide 241.2 07/15/2018: Magnesium 1.8 07/24/2018: ALT 9; BUN 6; Creatinine, Ser 0.82; Hemoglobin 9.3; Platelets 235; Sodium 136 11/28/2018: Potassium 3.1  07/14/2018: Cholesterol 65; HDL 23; LDL Cholesterol 27; Total CHOL/HDL Ratio 2.8; Triglycerides 76; VLDL 15   CrCl cannot be calculated (Patient's most recent lab result is older than the maximum 21 days allowed.).   Wt Readings from Last 3 Encounters:  12/01/18 177 lb (80.3 kg)  11/28/18 177 lb 0.5 oz (80.3 kg)  11/12/18 174 lb 2.6 oz (79 kg)     Other studies reviewed: Additional studies/records reviewed today include: summarized above  ASSESSMENT AND PLAN:  1. PPM (CHB)     RV threshold is slightly trending upwards, Impedance is stable, threshold manually today is good.  No changes made to output/programming.  Autocapture is on.       She is scheduled for a remote in 81moto keep an eye on her lead.     Good battery status, 4+ years  2. HTN     Looks good, no changes  3. Pre-op     She will need routine peri-operative management of her PPM     Risk score is low at  <1%, she has no new symptoms of any kind, no changes to her baseline exertional capacity, no known CAD history     Echo from Feb with LVEF 60-65% with no WMA      She has some risk with advanced age, though do not think she needs an ischemic w/u prior to her surgery scheduled for Wed. (low risk surgery)  4.  Remote finding of ATach     No clear/true Afib or flutter is noted, not all AMS episodes have EGMs     Disposition: F/u with remote transmission in 1 mo, in clinic in with Dr. KCaryl Comesin 1 year, sooner if needed  Current medicines are reviewed at length with the patient today.  The patient did not have any concerns regarding medicines.  SVenetia Night PA-C 12/01/2018 10:20 AM     [facility] [address] [phone](office)  [phone](fax)

## 2018-12-01 ENCOUNTER — Other Ambulatory Visit: Payer: Self-pay

## 2018-12-01 ENCOUNTER — Ambulatory Visit (INDEPENDENT_AMBULATORY_CARE_PROVIDER_SITE_OTHER): Payer: Medicare Other | Admitting: Physician Assistant

## 2018-12-01 ENCOUNTER — Encounter: Payer: Self-pay | Admitting: Physician Assistant

## 2018-12-01 VITALS — BP 124/76 | HR 73 | Ht 62.0 in | Wt 177.0 lb

## 2018-12-01 DIAGNOSIS — Z01818 Encounter for other preprocedural examination: Secondary | ICD-10-CM

## 2018-12-01 DIAGNOSIS — I471 Supraventricular tachycardia: Secondary | ICD-10-CM | POA: Diagnosis not present

## 2018-12-01 DIAGNOSIS — I10 Essential (primary) hypertension: Secondary | ICD-10-CM | POA: Diagnosis not present

## 2018-12-01 DIAGNOSIS — I4891 Unspecified atrial fibrillation: Secondary | ICD-10-CM | POA: Diagnosis not present

## 2018-12-01 DIAGNOSIS — Z950 Presence of cardiac pacemaker: Secondary | ICD-10-CM

## 2018-12-01 NOTE — Patient Instructions (Signed)
Medication Instructions:  Your physician recommends that you continue on your current medications as directed. Please refer to the Current Medication list given to you today.  If you need a refill on your cardiac medications before your next appointment, please call your pharmacy.   Lab work: NONE ORDERED  TODAY   If you have labs (blood work) drawn today and your tests are completely normal, you will receive your results only by: [name] MyChart Message (if you have MyChart) OR . A paper copy in the mail If you have any lab test that is abnormal or we need to change your treatment, we will call you to review the results.  Testing/Procedures: NONE ORDERED  TODAY    Follow-Up: At [facility], you and your health needs are our priority.  As part of our continuing mission to provide you with exceptional heart care, we have created designated Provider Care Teams.  These Care Teams include your primary Cardiologist (physician) and Advanced Practice Providers (APPs -  Physician Assistants and Nurse Practitioners) who all work together to provide you with the care you need, when you need it. You will need a follow up appointment in 1 years.  Please call our office 2 months in advance to schedule this appointment.  You may see Dr, Caryl Comes  or one of the following Advanced Practice Providers on your designated Care Team:   Chanetta Sherrer, NP . Tommye Standard, PA-C  Remote monitoring is used to monitor your Pacemaker of ICD from home. This monitoring reduces the number of office visits required to check your device to one time per year. It allows [place] to keep an eye on the functioning of your device to ensure it is working properly. You are scheduled for a device check from home on . 01-01-2019 You may send your transmission at any time that day. If you have a wireless device, the transmission will be sent automatically. After your physician reviews your transmission, you will receive a postcard with your next  transmission date.    Any Other Special Instructions Will Be Listed Below (If Applicable).

## 2018-12-02 NOTE — Pre-Procedure Instructions (Signed)
CARDIAC CLEATANCE WITH PACER INFO ON CHART

## 2018-12-03 ENCOUNTER — Encounter: Payer: Self-pay | Admitting: Certified Registered Nurse Anesthetist

## 2018-12-03 ENCOUNTER — Encounter
Admission: RE | Disposition: A | Discharge status: Home / Self Care | Payer: Self-pay | Source: Home / Self Care | Attending: General Surgery

## 2018-12-03 ENCOUNTER — Other Ambulatory Visit: Payer: Self-pay

## 2018-12-03 ENCOUNTER — Ambulatory Visit
Admission: RE | Admit: 2018-12-03 | Discharge: 2018-12-03 | Disposition: A | Discharge status: Home / Self Care | Payer: Medicare Other | Source: Ambulatory Visit | Attending: General Surgery | Admitting: General Surgery

## 2018-12-03 ENCOUNTER — Encounter: Payer: Self-pay | Admitting: *Deleted

## 2018-12-03 ENCOUNTER — Ambulatory Visit: Payer: Medicare Other | Admitting: Anesthesiology

## 2018-12-03 ENCOUNTER — Ambulatory Visit
Admission: RE | Admit: 2018-12-03 | Discharge: 2018-12-03 | Disposition: A | Discharge status: Home / Self Care | Payer: Medicare Other | Attending: General Surgery | Admitting: General Surgery

## 2018-12-03 DIAGNOSIS — I442 Atrioventricular block, complete: Secondary | ICD-10-CM | POA: Insufficient documentation

## 2018-12-03 DIAGNOSIS — Z950 Presence of cardiac pacemaker: Secondary | ICD-10-CM | POA: Insufficient documentation

## 2018-12-03 DIAGNOSIS — C50411 Malignant neoplasm of upper-outer quadrant of right female breast: Secondary | ICD-10-CM

## 2018-12-03 DIAGNOSIS — Z8673 Personal history of transient ischemic attack (TIA), and cerebral infarction without residual deficits: Secondary | ICD-10-CM | POA: Insufficient documentation

## 2018-12-03 DIAGNOSIS — Z170 Estrogen receptor positive status [ER+]: Secondary | ICD-10-CM

## 2018-12-03 DIAGNOSIS — Z8249 Family history of ischemic heart disease and other diseases of the circulatory system: Secondary | ICD-10-CM | POA: Diagnosis not present

## 2018-12-03 DIAGNOSIS — Z7982 Long term (current) use of aspirin: Secondary | ICD-10-CM | POA: Diagnosis not present

## 2018-12-03 DIAGNOSIS — Z79899 Other long term (current) drug therapy: Secondary | ICD-10-CM | POA: Insufficient documentation

## 2018-12-03 DIAGNOSIS — E785 Hyperlipidemia, unspecified: Secondary | ICD-10-CM | POA: Diagnosis not present

## 2018-12-03 DIAGNOSIS — Z87891 Personal history of nicotine dependence: Secondary | ICD-10-CM | POA: Insufficient documentation

## 2018-12-03 DIAGNOSIS — K219 Gastro-esophageal reflux disease without esophagitis: Secondary | ICD-10-CM | POA: Diagnosis not present

## 2018-12-03 DIAGNOSIS — E7211 Homocystinuria: Secondary | ICD-10-CM | POA: Insufficient documentation

## 2018-12-03 DIAGNOSIS — Z7902 Long term (current) use of antithrombotics/antiplatelets: Secondary | ICD-10-CM | POA: Insufficient documentation

## 2018-12-03 DIAGNOSIS — I10 Essential (primary) hypertension: Secondary | ICD-10-CM | POA: Diagnosis not present

## 2018-12-03 HISTORY — PX: BREAST LUMPECTOMY: SHX2

## 2018-12-03 HISTORY — PX: PARTIAL MASTECTOMY WITH NEEDLE LOCALIZATION AND AXILLARY SENTINEL LYMPH NODE BX: SHX6009

## 2018-12-03 LAB — POCT I-STAT 4, (NA,K, GLUC, HGB,HCT)
Glucose, Bld: 93 mg/dL (ref 70–99)
HCT: 41 % (ref 36.0–46.0)
Hemoglobin: 13.9 g/dL (ref 12.0–15.0)
Potassium: 3.1 mmol/L — ABNORMAL LOW (ref 3.5–5.1)
Sodium: 144 mmol/L (ref 135–145)

## 2018-12-03 SURGERY — PARTIAL MASTECTOMY WITH NEEDLE LOCALIZATION AND AXILLARY SENTINEL LYMPH NODE BX
Anesthesia: General | Laterality: Right

## 2018-12-03 MED ORDER — PROPOFOL 10 MG/ML IV BOLUS
INTRAVENOUS | Status: DC | PRN
  Administered 2018-12-03: 100 mg via INTRAVENOUS

## 2018-12-03 MED ORDER — ROCURONIUM BROMIDE 100 MG/10ML IV SOLN
INTRAVENOUS | Status: DC | PRN
  Administered 2018-12-03: 30 mg via INTRAVENOUS

## 2018-12-03 MED ORDER — ONDANSETRON HCL 4 MG/2ML IJ SOLN
INTRAMUSCULAR | Status: DC | PRN
  Administered 2018-12-03: 4 mg via INTRAVENOUS

## 2018-12-03 MED ORDER — BUPIVACAINE-EPINEPHRINE (PF) 0.5% -1:200000 IJ SOLN
INTRAMUSCULAR | Status: DC | PRN
  Administered 2018-12-03: 30 mL

## 2018-12-03 MED ORDER — LIDOCAINE HCL 4 % MT SOLN
OROMUCOSAL | Status: DC | PRN
  Administered 2018-12-03: 4 mL via TOPICAL

## 2018-12-03 MED ORDER — OXYCODONE HCL 5 MG PO TABS
5.0000 mg | ORAL_TABLET | Freq: Once | ORAL | Status: AC | PRN
  Administered 2018-12-03: 14:00:00 5 mg via ORAL

## 2018-12-03 MED ORDER — LIDOCAINE HCL (PF) 2 % IJ SOLN
INTRAMUSCULAR | Status: AC
  Filled 2018-12-03: qty 10

## 2018-12-03 MED ORDER — CEFAZOLIN SODIUM-DEXTROSE 2-3 GM-%(50ML) IV SOLR
INTRAVENOUS | Status: DC | PRN
  Administered 2018-12-03: 2 g via INTRAVENOUS

## 2018-12-03 MED ORDER — LIDOCAINE HCL (CARDIAC) PF 100 MG/5ML IV SOSY
PREFILLED_SYRINGE | INTRAVENOUS | Status: DC | PRN
  Administered 2018-12-03: 60 mg via INTRAVENOUS

## 2018-12-03 MED ORDER — TECHNETIUM TC 99M SULFUR COLLOID FILTERED
0.7140 | Freq: Once | INTRAVENOUS | Status: AC | PRN
  Administered 2018-12-03: 09:00:00 0.714 via INTRADERMAL

## 2018-12-03 MED ORDER — LACTATED RINGERS IV SOLN
INTRAVENOUS | Status: DC | PRN
  Administered 2018-12-03: 12:00:00 via INTRAVENOUS

## 2018-12-03 MED ORDER — FENTANYL CITRATE (PF) 100 MCG/2ML IJ SOLN
INTRAMUSCULAR | Status: DC | PRN
  Administered 2018-12-03 (×3): 50 ug via INTRAVENOUS

## 2018-12-03 MED ORDER — SUGAMMADEX SODIUM 200 MG/2ML IV SOLN
INTRAVENOUS | Status: AC
  Filled 2018-12-03: qty 2

## 2018-12-03 MED ORDER — SUGAMMADEX SODIUM 200 MG/2ML IV SOLN
INTRAVENOUS | Status: DC | PRN
  Administered 2018-12-03: 200 mg via INTRAVENOUS

## 2018-12-03 MED ORDER — SUCCINYLCHOLINE CHLORIDE 20 MG/ML IJ SOLN
INTRAMUSCULAR | Status: AC
  Filled 2018-12-03: qty 1

## 2018-12-03 MED ORDER — DEXAMETHASONE SODIUM PHOSPHATE 10 MG/ML IJ SOLN
INTRAMUSCULAR | Status: DC | PRN
  Administered 2018-12-03: 10 mg via INTRAVENOUS

## 2018-12-03 MED ORDER — ACETAMINOPHEN 10 MG/ML IV SOLN
INTRAVENOUS | Status: AC
  Filled 2018-12-03: qty 100

## 2018-12-03 MED ORDER — SODIUM CHLORIDE 0.9 % IV SOLN
INTRAVENOUS | Status: DC | PRN
  Administered 2018-12-03: 12:00:00 30 ug/min via INTRAVENOUS

## 2018-12-03 MED ORDER — OXYCODONE HCL 5 MG PO TABS
ORAL_TABLET | ORAL | Status: AC
  Filled 2018-12-03: qty 1

## 2018-12-03 MED ORDER — ONDANSETRON HCL 4 MG/2ML IJ SOLN
INTRAMUSCULAR | Status: AC
  Filled 2018-12-03: qty 2

## 2018-12-03 MED ORDER — HYDROCODONE-ACETAMINOPHEN 5-325 MG PO TABS: 1.0000 | ORAL_TABLET | ORAL | 0 refills | Status: AC | PRN

## 2018-12-03 MED ORDER — OXYCODONE HCL 5 MG/5ML PO SOLN: 5.0000 mg | Freq: Once | ORAL | Status: AC | PRN

## 2018-12-03 MED ORDER — ACETAMINOPHEN 10 MG/ML IV SOLN
INTRAVENOUS | Status: DC | PRN
  Administered 2018-12-03: 1000 mg via INTRAVENOUS

## 2018-12-03 MED ORDER — ROCURONIUM BROMIDE 50 MG/5ML IV SOLN
INTRAVENOUS | Status: AC
  Filled 2018-12-03: qty 1

## 2018-12-03 MED ORDER — PHENYLEPHRINE HCL (PRESSORS) 10 MG/ML IV SOLN
INTRAVENOUS | Status: DC | PRN
  Administered 2018-12-03: 200 ug via INTRAVENOUS
  Administered 2018-12-03: 100 ug via INTRAVENOUS

## 2018-12-03 MED ORDER — FENTANYL CITRATE (PF) 250 MCG/5ML IJ SOLN
INTRAMUSCULAR | Status: AC
  Filled 2018-12-03: qty 5

## 2018-12-03 MED ORDER — PROPOFOL 10 MG/ML IV BOLUS
INTRAVENOUS | Status: AC
  Filled 2018-12-03: qty 20

## 2018-12-03 MED ORDER — FENTANYL CITRATE (PF) 100 MCG/2ML IJ SOLN: 25.0000 ug | INTRAMUSCULAR | Status: DC | PRN

## 2018-12-03 MED ORDER — DEXAMETHASONE SODIUM PHOSPHATE 10 MG/ML IJ SOLN
INTRAMUSCULAR | Status: AC
  Filled 2018-12-03: qty 1

## 2018-12-03 SURGICAL SUPPLY — 44 items
BINDER BREAST LRG (GAUZE/BANDAGES/DRESSINGS) IMPLANT
BINDER BREAST MEDIUM (GAUZE/BANDAGES/DRESSINGS) IMPLANT
BINDER BREAST XLRG (GAUZE/BANDAGES/DRESSINGS) IMPLANT
BINDER BREAST XXLRG (GAUZE/BANDAGES/DRESSINGS) ×3 IMPLANT
BLADE SURG 15 STRL LF DISP TIS (BLADE) ×2 IMPLANT
BLADE SURG 15 STRL SS (BLADE) ×4
CANISTER SUCT 1200ML W/VALVE (MISCELLANEOUS) ×3 IMPLANT
CHLORAPREP W/TINT 26 (MISCELLANEOUS) ×3 IMPLANT
CNTNR SPEC 2.5X3XGRAD LEK (MISCELLANEOUS) ×1
CONT SPEC 4OZ STER OR WHT (MISCELLANEOUS) ×2
CONTAINER SPEC 2.5X3XGRAD LEK (MISCELLANEOUS) ×1 IMPLANT
COVER PROBE FLX POLY STRL (MISCELLANEOUS) ×3 IMPLANT
COVER WAND RF STERILE (DRAPES) ×3 IMPLANT
DERMABOND ADVANCED (GAUZE/BANDAGES/DRESSINGS) ×2
DERMABOND ADVANCED .7 DNX12 (GAUZE/BANDAGES/DRESSINGS) ×1 IMPLANT
DEVICE DUBIN SPECIMEN MAMMOGRA (MISCELLANEOUS) ×3 IMPLANT
DRAPE LAPAROTOMY TRNSV 106X77 (MISCELLANEOUS) ×3 IMPLANT
ELECT CAUTERY BLADE 6.4 (BLADE) ×3 IMPLANT
ELECT REM PT RETURN 9FT ADLT (ELECTROSURGICAL) ×3
ELECTRODE REM PT RTRN 9FT ADLT (ELECTROSURGICAL) ×1 IMPLANT
GAUZE FLUFF 18X24 1PLY STRL (GAUZE/BANDAGES/DRESSINGS) ×3 IMPLANT
GLOVE BIO SURGEON STRL SZ 6.5 (GLOVE) ×2 IMPLANT
GLOVE BIO SURGEONS STRL SZ 6.5 (GLOVE) ×1
GLOVE INDICATOR 6.5 STRL GRN (GLOVE) ×3 IMPLANT
GOWN STRL REUS W/ TWL LRG LVL3 (GOWN DISPOSABLE) ×2 IMPLANT
GOWN STRL REUS W/TWL LRG LVL3 (GOWN DISPOSABLE) ×4
KIT TURNOVER KIT A (KITS) ×3 IMPLANT
LABEL OR SOLS (LABEL) IMPLANT
NEEDLE HYPO 22GX1.5 SAFETY (NEEDLE) ×3 IMPLANT
NEEDLE HYPO 25X1 1.5 SAFETY (NEEDLE) ×3 IMPLANT
PACK BASIN MINOR ARMC (MISCELLANEOUS) ×3 IMPLANT
RETRACTOR RING XSMALL (MISCELLANEOUS) ×1 IMPLANT
RTRCTR WOUND ALEXIS 13CM XS SH (MISCELLANEOUS) ×3
SLEVE PROBE SENORX GAMMA FIND (MISCELLANEOUS) ×3 IMPLANT
SUT MNCRL 4-0 (SUTURE) ×4
SUT MNCRL 4-0 27XMFL (SUTURE) ×2
SUT SILK 2 0 SH (SUTURE) ×3 IMPLANT
SUT VIC AB 3-0 SH 27 (SUTURE) ×4
SUT VIC AB 3-0 SH 27X BRD (SUTURE) ×2 IMPLANT
SUTURE MNCRL 4-0 27XMF (SUTURE) ×2 IMPLANT
SYR 10ML LL (SYRINGE) ×6 IMPLANT
SYR BULB IRRIG 60ML STRL (SYRINGE) ×3 IMPLANT
TAPE TRANSPORE STRL 2 31045 (GAUZE/BANDAGES/DRESSINGS) ×3 IMPLANT
WATER STERILE IRR 1000ML POUR (IV SOLUTION) ×3 IMPLANT

## 2018-12-03 NOTE — Anesthesia Preprocedure Evaluation (Addendum)
Anesthesia Evaluation  Patient identified by MRN, date of birth, ID band Patient awake    Reviewed: Allergy & Precautions, H&P , NPO status , Patient's Chart, lab work & pertinent test results  Airway Mallampati: II  TM Distance: >3 FB     Dental  (+) Upper Dentures, Lower Dentures   Pulmonary pneumonia (Feb 2020), resolved, neg COPD, neg recent URI, former smoker,           Cardiovascular hypertension, (-) angina(-) Past MI, (-) Cardiac Stents and (-) CHF + dysrhythmias (h/o complete heart block s/p pacemaker, AFib) Atrial Fibrillation + pacemaker     1. The left ventricle has normal systolic function with an ejection fraction of 60-65%. The cavity size was normal. Left ventricular diastolic Doppler parameters are indeterminate. No evidence of left ventricular regional wall motion abnormalities.  2. The right ventricle has normal systolic function. The cavity was normal. There is no increase in right ventricular wall thickness. Right ventricular systolic pressure is moderately elevated with an estimated pressure of 48.4 mmHg.  3. Mild thickening of the mitral valve leaflet.  4. The aortic valve is tricuspid Mild calcification of the aortic valve.    Neuro/Psych CVA (left-sided weakness, mostly in leg), Residual Symptoms negative psych ROS   GI/Hepatic Neg liver ROS, GERD  Controlled,  Endo/Other  negative endocrine ROS  Renal/GU H/o rhabdomyolysis a few months ago     Musculoskeletal   Abdominal   Peds  Hematology negative hematology ROS (+)   Anesthesia Other Findings Obese  Past Medical History: No date: Complete heart block ([place]) 07/23/2016: Gait abnormality No date: GERD (gastroesophageal reflux disease) No date: History of hypertension No date: History of stroke No date: Hyperhomocysteinemia ([place]) No date: Hyperlipidemia No date: Hypertension 07/13/2018: Hypokalemia 07/13/2018: Low blood magnesium 08/07/2011:  Pacemaker-Medtronic No date: Stroke [facility])     Comment:  left sided weakness 07/13/2018: Vomiting  Past Surgical History: 07/15/2018: BIOPSY     Comment:  Procedure: BIOPSY;  Surgeon: [name], MD;                Location: [facility] ENDOSCOPY;  Service: Gastroenterology;; 10/27/2018: BREAST BIOPSY; Right     Comment:  affirm bx of mass, x clip,  INVASIVE MAMMARY CARCINOMA No date: CARDIAC CATHETERIZATION 07/15/2018: ESOPHAGOGASTRODUODENOSCOPY; Left     Comment:  Procedure: ESOPHAGOGASTRODUODENOSCOPY (EGD);  Surgeon:               [name], MD;  Location: [facility];  Service:               Gastroenterology;  Laterality: Left; No date: PACEMAKER INSERTION     Comment:  Medtronic Enpulse dual-chamber pacemaker 07/30/2013: PERMANENT PACEMAKER GENERATOR CHANGE; N/A     Comment:  Procedure: PERMANENT PACEMAKER GENERATOR CHANGE;                Surgeon: [name], MD;  Location: [facility] CATH LAB;                Service: Cardiovascular;  Laterality: N/A;     Reproductive/Obstetrics negative OB ROS                          Anesthesia Physical Anesthesia Plan  ASA: III  Anesthesia Plan: General ETT   Post-op Pain Management:    Induction:   PONV Risk Score and Plan: Ondansetron, Dexamethasone and Treatment may vary due to age or medical condition  Airway Management Planned:  Additional Equipment:   Intra-op Plan:   Post-operative Plan:   Informed Consent: I have reviewed the patients History and Physical, chart, labs and discussed the procedure including the risks, benefits and alternatives for the proposed anesthesia with the patient or authorized representative who has indicated his/her understanding and acceptance.     Dental Advisory Given  Plan Discussed with: Anesthesiologist and CRNA  Anesthesia Plan Comments:         Anesthesia Quick Evaluation

## 2018-12-03 NOTE — Discharge Instructions (Signed)
°  Diet: Resume home heart healthy regular diet.  ° °Activity: Increase activity as tolerated. Light activity and walking are encouraged. Do not drive or drink alcohol if taking narcotic pain medications. ° °Wound care: May shower with soapy water and pat dry (do not rub incisions), but no baths or submerging incision underwater until follow-up. (no swimming)  ° °Medications: Resume all home medications. For mild to moderate pain: acetaminophen (Tylenol) or ibuprofen (if no kidney disease). Combining Tylenol with alcohol can substantially increase your risk of causing liver disease. Narcotic pain medications, if prescribed, can be used for severe pain, though may cause nausea, constipation, and drowsiness. Do not combine Tylenol and Norco within a 6 hour period as Norco contains Tylenol. If you do not need the narcotic pain medication, you do not need to fill the prescription. ° °Call office ([phone]) at any time if any questions, worsening pain, fevers/chills, bleeding, drainage from incision site, or other concerns. ° °AMBULATORY SURGERY  °DISCHARGE INSTRUCTIONS ° ° °1) The drugs that you were given will stay in your system until tomorrow so for the next 24 hours you should not: ° °A) Drive an automobile °B) Make any legal decisions °C) Drink any alcoholic beverage ° ° °2) You may resume regular meals tomorrow.  Today it is better to start with liquids and gradually work up to solid foods. ° °You may eat anything you prefer, but it is better to start with liquids, then soup and crackers, and gradually work up to solid foods. ° ° °3) Please notify your doctor immediately if you have any unusual bleeding, trouble breathing, redness and pain at the surgery site, drainage, fever, or pain not relieved by medication. ° ° ° °4) Additional Instructions: ° ° ° ° ° ° ° °Please contact your physician with any problems or Same Day Surgery at [phone], Monday through Friday 6 am to 4 pm, or [place] [facility] [place] Main  number at [phone]. °

## 2018-12-03 NOTE — Transfer of Care (Signed)
Immediate Anesthesia Transfer of Care Note  Patient: [name] Last  Procedure(s) Performed: PARTIAL MASTECTOMY WITH NEEDLE LOCALIZATION AND AXILLARY SENTINEL LYMPH NODE BX, RIGHT (Right )  Patient Location: PACU  Anesthesia Type:General  Level of Consciousness: awake, alert , oriented and patient cooperative  Airway & Oxygen Therapy: Patient Spontanous Breathing and Patient connected to face mask oxygen  Post-op Assessment: Report given to RN and Post -op Vital signs reviewed and stable  Post vital signs: Reviewed and stable  Last Vitals:  Vitals Value Taken Time  BP 133/64 12/03/18 1331  Temp    Pulse 62 12/03/18 1333  Resp 13 12/03/18 1333  SpO2 97 % 12/03/18 1333  Vitals shown include unvalidated device data.  Last Pain:  Vitals:   12/03/18 1330  TempSrc:   PainSc: (P) Asleep         Complications: No apparent anesthesia complications

## 2018-12-03 NOTE — Anesthesia Procedure Notes (Signed)
Procedure Name: Intubation Date/Time: 12/03/2018 11:44 AM Performed by: Jonna Clark, CRNA Pre-anesthesia Checklist: Patient identified, Patient being monitored, Timeout performed, Emergency Drugs available and Suction available Patient Re-evaluated:Patient Re-evaluated prior to induction Oxygen Delivery Method: Circle system utilized Preoxygenation: Pre-oxygenation with 100% oxygen Induction Type: IV induction Ventilation: Mask ventilation without difficulty Laryngoscope Size: Mac and 3 Grade View: Grade I Tube type: Oral Tube size: 7.0 mm Number of attempts: 1 Airway Equipment and Method: Stylet Placement Confirmation: ETT inserted through vocal cords under direct vision,  positive ETCO2 and breath sounds checked- equal and bilateral Secured at: 21 cm Tube secured with: Tape Dental Injury: Teeth and Oropharynx as per pre-operative assessment

## 2018-12-03 NOTE — Anesthesia Post-op Follow-up Note (Signed)
Anesthesia QCDR form completed.        

## 2018-12-03 NOTE — Interval H&P Note (Signed)
History and Physical Interval Note:  12/03/2018 10:40 AM  [name]  has presented today for surgery, with the diagnosis of C50.411, 217.0 MALIGNANT NEOPLASM OF UPPER-OUTER QUADRANT OF RIGHT BREAST IN FEMALE, ESTROGEN POSITIVE.  The various methods of treatment have been discussed with the patient and family. After consideration of risks, benefits and other options for treatment, the patient has consented to  Procedure(s): PARTIAL MASTECTOMY WITH NEEDLE LOCALIZATION AND AXILLARY SENTINEL LYMPH NODE BX, RIGHT (Right) as a surgical intervention.  The patient's history has been reviewed, patient examined, no change in status, stable for surgery.  I have reviewed the patient's chart and labs.  Right breast marked in the pre procedure area. Questions were answered to the patient's satisfaction.     [name]

## 2018-12-03 NOTE — Op Note (Signed)
Preoperative diagnosis: Right breast carcinoma.  Postoperative diagnosis: Right breast carcinoma..   Procedure: Right needle-localized partial mastectomy.                       Right Axillary Sentinel Lymph node biopsy  Anesthesia: GETA  Surgeon: Dr. Windell Moment  Wound Classification: Clean  Indications: Patient is a 85 y.o. female with a nonpalpable right breast mass noted on mammography with core biopsy demonstrating invasive mammary carcinoma requires needle-localized partial mastectomy for treatment with sentinel lymph node biopsy.   Findings: 1. Specimen mammography shows marker and wire on specimen 2. Pathology call refers gross examination of margins was more than 1 cm 3. No other palpable mass or lymph node identified.   Description of procedure: Preoperative needle localization was performed by radiology. In the nuclear medicine suite, the subareolar region was injected with Tc-99 sulfur colloid. Localization studies were reviewed. The patient was taken to the operating room and placed supine on the operating table, and after general anesthesia the right chest and axilla were prepped and draped in the usual sterile fashion. A time-out was completed verifying correct patient, procedure, site, positioning, and implant(s) and/or special equipment prior to beginning this procedure.  By comparing the localization studies with the direction and skin entry site of the needle, the probable trajectory and location of the mass was visualized. A circumareolar skin incision was planned in such a way as to minimize the amount of dissection to reach the mass.  The skin incision was made. Flaps were raised and the location of the wire confirmed. The wire was delivered into the wound. A 2-0 silk figure-of-eight stay suture was placed around the wire and used for retraction. Dissection was then taken down circumferentially, taking care to include the entire localizing needle and a wide margin of  grossly normal tissue. The specimen and entire localizing wire were removed. The specimen was oriented and sent to radiology with the localization studies. Confirmation was received that the entire target lesion had been resected.  A hand-held gamma probe was used to identify the location of the hottest spot in the axilla. An incision was made around the caudal axillary hairline. Dissection was carried down until subdermal facias was advanced. The probe was placed and again, the point of maximal count was found. Dissection continue until nodule was identified.  The node was excised in its entirety. Ex vivo, the node measured 325 counts when placed on the probe.  An additional hot spot was detected and the node was excised in similar fashion. The following counts registered:221 ex vivo node, and 8 residual bed. No additional hot spots were identified. No clinically abnormal nodes were palpated. The procedure was terminated. Hemostasis was achieved and the wound closed deep interrupted 3-0 Vicryl and skin was closed with subcuticular suture of Monocryl 4-0.  The patient tolerated the procedure well and was taken to the postanesthesia care unit in stable condition.   Specimen: Right Breast mass (Orientation markers used: Cranial, Medial, Skin)                     Sentinel Lymph nodes #1 and #2  Complications: None  Estimated Blood Loss: 10 mL

## 2018-12-04 ENCOUNTER — Encounter: Payer: Self-pay | Admitting: General Surgery

## 2018-12-05 LAB — SURGICAL PATHOLOGY

## 2018-12-05 NOTE — Anesthesia Postprocedure Evaluation (Signed)
Anesthesia Post Note  Patient: [name] Last  Procedure(s) Performed: PARTIAL MASTECTOMY WITH NEEDLE LOCALIZATION AND AXILLARY SENTINEL LYMPH NODE BX, RIGHT (Right )  Patient location during evaluation: PACU Anesthesia Type: General Level of consciousness: awake and alert Pain management: pain level controlled Vital Signs Assessment: post-procedure vital signs reviewed and stable Respiratory status: spontaneous breathing, nonlabored ventilation and respiratory function stable Cardiovascular status: blood pressure returned to baseline and stable Postop Assessment: no apparent nausea or vomiting Anesthetic complications: no     Last Vitals:  Vitals:   12/03/18 1529 12/03/18 1547  BP: 124/70 114/62  Pulse: 60 69  Resp: 16 16  Temp: 36.4 C   SpO2: 98% 96%    Last Pain:  Vitals:   12/04/18 0813  TempSrc:   PainSc: [place]

## 2018-12-19 ENCOUNTER — Ambulatory Visit
Admission: RE | Admit: 2018-12-19 | Discharge: 2018-12-19 | Disposition: A | Discharge status: Home / Self Care | Payer: Medicare Other | Source: Ambulatory Visit | Attending: Urology | Admitting: Urology

## 2018-12-19 ENCOUNTER — Other Ambulatory Visit: Payer: Self-pay

## 2018-12-19 DIAGNOSIS — N2889 Other specified disorders of kidney and ureter: Secondary | ICD-10-CM | POA: Insufficient documentation

## 2018-12-19 MED ORDER — IOHEXOL 300 MG/ML  SOLN
100.0000 mL | Freq: Once | INTRAMUSCULAR | Status: AC | PRN
  Administered 2018-12-19: 100 mL via INTRAVENOUS

## 2018-12-19 NOTE — Progress Notes (Signed)
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  INTERVAL HISTORY: Patient returns to clinic today to discuss her final pathology results and treatment planning.  She tolerated her surgery well without significant side effects.  She currently feels well and is asymptomatic.  She has no neurologic complaints.  She denies any recent fevers or illnesses.  She has a good appetite and denies weight loss.  She has no chest pain, shortness of breath, cough, or hemoptysis.  She denies any nausea, vomiting, constipation, or diarrhea.  She has no urinary complaints.  Patient offers no specific complaints today.  REVIEW OF SYSTEMS:   Review of Systems  Constitutional: Negative.  Negative for fever, malaise/fatigue and weight loss.  Respiratory: Negative.  Negative for cough, hemoptysis and shortness of breath.   Cardiovascular: Negative.  Negative for chest pain and leg swelling.  Gastrointestinal: Negative.  Negative for abdominal pain.  Genitourinary: Negative.  Negative for dysuria.  Musculoskeletal: Negative.  Negative for back pain.  Skin: Negative.  Negative for rash.  Neurological: Negative.  Negative for dizziness, focal weakness, weakness and headaches.  Psychiatric/Behavioral: Negative.  The patient is not nervous/anxious.     As per HPI. Otherwise, a complete review of systems is negative.  PAST MEDICAL HISTORY: Past Medical History:  Diagnosis Date   Complete heart block (HCC)    Gait abnormality 07/23/2016   GERD  (gastroesophageal reflux disease)    History of hypertension    History of stroke    Hyperhomocysteinemia (HCC)    Hyperlipidemia    Hypertension    Hypokalemia 07/13/2018   Low blood magnesium 07/13/2018   Pacemaker-Medtronic 08/07/2011   [place] ([place])    left sided weakness   Vomiting 07/13/2018    PAST SURGICAL HISTORY: Past Surgical History:  Procedure Laterality Date   BIOPSY  07/15/2018   Procedure: BIOPSY;  Surgeon: [name], MD;  Location: [facility];  Service: Gastroenterology;;   BREAST BIOPSY Right 10/27/2018   affirm bx of mass, x clip,  INVASIVE MAMMARY CARCINOMA   BREAST LUMPECTOMY Right 12/03/2018   CARDIAC CATHETERIZATION     ESOPHAGOGASTRODUODENOSCOPY Left 07/15/2018   Procedure: ESOPHAGOGASTRODUODENOSCOPY (EGD);  Surgeon: [name], MD;  Location: [facility];  Service: Gastroenterology;  Laterality: Left;   PACEMAKER INSERTION     Medtronic Enpulse dual-chamber pacemaker   PARTIAL MASTECTOMY WITH NEEDLE LOCALIZATION AND AXILLARY SENTINEL LYMPH NODE BX Right 12/03/2018   Procedure: PARTIAL MASTECTOMY WITH NEEDLE LOCALIZATION AND AXILLARY SENTINEL LYMPH NODE BX, RIGHT;  Surgeon: [name], MD;  Location: [facility] ORS;  Service: General;  Laterality: Right;   PERMANENT PACEMAKER GENERATOR CHANGE N/A 07/30/2013   Procedure: PERMANENT PACEMAKER GENERATOR CHANGE;  Surgeon: [name], MD;  Location: [facility] CATH LAB;  Service: Cardiovascular;  Laterality: N/A;    FAMILY HISTORY: Family History  Problem Relation Age of Onset   Hypertension Mother    Colon cancer Neg Hx    Esophageal cancer Neg Hx     ADVANCED DIRECTIVES (Y/N):  N  HEALTH MAINTENANCE: Social History   Tobacco Use   Smoking status: Former  Smoker    Quit date: 07/25/2006    Years since quitting: 12.4   Smokeless tobacco: Never Used  Substance Use Topics   Alcohol use: No   Drug use: No     Colonoscopy:  PAP:  Bone density:  Lipid panel:  No Known  Allergies  Current Outpatient Medications  Medication Sig Dispense Refill   acetaminophen (TYLENOL) 500 MG tablet Take 500-1,000 mg by mouth every 6 (six) hours as needed for moderate pain.      amLODipine (NORVASC) 5 MG tablet Take 5 mg by mouth daily.     aspirin 81 MG EC tablet Take 81 mg by mouth daily.      Blood Pressure KIT Automated blood pressure measuring device. 1 each 0   clopidogrel (PLAVIX) 75 MG tablet Take 75 mg by mouth daily with breakfast.     hydrochlorothiazide (HYDRODIURIL) 25 MG tablet Take 25 mg by mouth daily.     LUMIGAN 0.01 % SOLN Place 1 drop into both eyes at bedtime.  4   mirtazapine (REMERON) 7.5 MG tablet Take 7.5 mg by mouth at bedtime.      pantoprazole (PROTONIX) 40 MG tablet Take 40 mg by mouth daily.     Potassium 99 MG TABS Take 99 mg by mouth daily.     simvastatin (ZOCOR) 40 MG tablet Take 1 tablet (40 mg total) by mouth at bedtime. Please make overdue appt with Dr. Caryl Comes before anymore refills. 1st attempt (Patient taking differently: Take 40 mg by mouth at bedtime. ) 30 tablet 0   letrozole (FEMARA) 2.5 MG tablet Take 1 tablet (2.5 mg total) by mouth daily. 30 tablet 11   No current facility-administered medications for this visit.     OBJECTIVE: There were no vitals filed for this visit.   There is no height or weight on file to calculate BMI.    ECOG FS:0 - Asymptomatic  General: Well-developed, well-nourished, no acute distress. Eyes: Pink conjunctiva, anicteric sclera. HEENT: Normocephalic, moist mucous membranes. Breast: Well-healed surgical scar on right breast. Lungs: Clear to auscultation bilaterally. Heart: Regular rate and rhythm. No rubs, murmurs, or gallops. Abdomen: Soft, nontender, nondistended. No organomegaly noted, normoactive bowel sounds. Musculoskeletal: No edema, cyanosis, or clubbing. Neuro: Alert, answering all questions appropriately. Cranial nerves grossly intact. Skin: No rashes or petechiae  noted. Psych: Normal affect.   LAB RESULTS:  Lab Results  Component Value Date   NA 144 12/03/2018   K 3.1 (L) 12/03/2018   CL 109 07/24/2018   CO2 20 (L) 07/24/2018   GLUCOSE 93 12/03/2018   BUN 6 (L) 07/24/2018   CREATININE 0.82 07/24/2018   CALCIUM 7.6 (L) 07/24/2018   PROT 4.9 (L) 07/24/2018   ALBUMIN 1.9 (L) 07/24/2018   AST 14 (L) 07/24/2018   ALT 9 07/24/2018   ALKPHOS 52 07/24/2018   BILITOT 0.7 07/24/2018   GFRNONAA >60 07/24/2018   GFRAA >60 07/24/2018    Lab Results  Component Value Date   WBC 4.5 07/24/2018   NEUTROABS 6.6 07/13/2018   HGB 13.9 12/03/2018   HCT 41.0 12/03/2018   MCV 91.0 07/24/2018   PLT 235 07/24/2018     STUDIES: Nm Sentinel Node Injection  Result Date: 12/03/2018 CLINICAL DATA:  Right breast cancer. EXAM: NUCLEAR MEDICINE BREAST LYMPHOSCINTIGRAPHY TECHNIQUE: Intradermal injection of radiopharmaceutical was performed at the 12 o'clock, 3 o'clock, 6 o'clock, and 9 o'clock positions around the left/right nipple. The patient was then sent to the operating room where the sentinel node(s) were  identified and removed by the surgeon. RADIOPHARMACEUTICALS:  Total of 0.7 mCi Millipore-filtered Technetium-41msulfur colloid, injected in four equal aliquots. IMPRESSION: Uncomplicated intradermal injection of a total of 0.7 mCi Technetium-949mulfur colloid for purposes of sentinel node identification. Electronically Signed   By: ThBarrington On: 12/03/2018 10:12   Mm Breast Surgical Specimen  Result Date: 12/03/2018 CLINICAL DATA:  Status post surgical excision of biopsy proven invasive mammary carcinoma. EXAM: SPECIMEN RADIOGRAPH OF THE RIGHT BREAST COMPARISON:  Previous exam(s). FINDINGS: Status post excision of the right breast. The wire tip and biopsy marker clip are present and are marked for pathology. IMPRESSION: Specimen radiograph of the right breast. Electronically Signed   By: DiLillia Mountain.D.   On: 12/03/2018 12:49   Ct Abd Wo & W  Cm  Result Date: 12/19/2018 CLINICAL DATA:  Renal mass follow-up.  CT exam July 20, 2018 EXAM: CT ABDOMEN WITHOUT AND WITH CONTRAST TECHNIQUE: Multidetector CT imaging of the abdomen was performed following the standard protocol before and following the bolus administration of intravenous contrast. CONTRAST:  10027mMNIPAQUE IOHEXOL 300 MG/ML  SOLN COMPARISON:  CT July 20, 2018 FINDINGS: Lower chest:  Lung bases are clear. Hepatobiliary: No focal hepatic lesion. Several 1 cm gallstones within lumen gallbladder. No gallbladder inflammation. Common bile duct. Pancreas: Normal pancreatic parenchymal intensity. No ductal dilatation or inflammation. Spleen: Normal spleen. Adrenals/urinary tract: Adrenal glands are normal. Rounded mass exophytic from the lower pole of the RIGHT kidney measures two cm. Noncontrast imaging demonstrates low-density within lesion consistent with macroscopic fat (HU equal -36). 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 On the post-contrast imaging there is central enhancement of this fat containing lesion. Lesion lesion not changed in size from 2.3 cm on 07/20/2018. Additional bilateral nonenhancing cystic lesions within LEFT and RIGHT kidney. Stomach/Bowel: Stomach and limited of the small bowel is unremarkable Vascular/Lymphatic: Abdominal aorta is normal caliber with atherosclerotic calcification. There is no retroperitoneal or periportal lymphadenopathy. No pelvic lymphadenopathy. Musculoskeletal: No aggressive osseous lesion IMPRESSION: 1. Fat containing round lesion partially exophytic in the lower pole RIGHT kidney. While certain renal cell carcinomas are noted to contain macroscopic fa;. 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 No growth compared to 09/19/2018. 2. Additional Bosniak 1 renal cysts of LEFT and RIGHT kidneys. 3. Cholelithiasis evidence cholecystitis. Electronically Signed   By: SteSuzy BouchardD.   On: 12/19/2018 14:57   Mm Rt Plc Breast Loc Dev    1st Lesion  Inc Mammo Guide  Result Date: 12/03/2018 CLINICAL DATA:  Biopsy proven invasive mammary carcinoma. EXAM: NEEDLE LOCALIZATION OF THE  BREAST WITH MAMMO GUIDANCE COMPARISON:  Previous exams. FINDINGS: Patient presents for needle localization prior to surgery. I met with the patient and we discussed the procedure of needle localization including benefits and alternatives. We discussed the high likelihood of a successful procedure. We discussed the risks of the procedure, including infection, bleeding, tissue injury, and further surgery. Informed, written consent was given. The usual time-out protocol was performed immediately prior to the procedure. Using mammographic guidance, sterile technique, 1% lidocaine and a #7 modified Kopans needle, the X shaped clip localized using superior to inferior approach. The images were marked for Dr. CinWindell MomentMPRESSION: Needle localization the right breast breast. No apparent complications. Electronically Signed   By: DinLillia MountainD.   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  PLAN:  1.  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  Given her advanced age and small size of malignancy, Oncotype DX or adjuvant chemotherapy is not necessary.  She was seen by radiation oncology today who also stated that adjuvant XRT is not needed.  She was given a prescription for letrozole today which she will continue for 5 years completing in August 2025.  Will get baseline bone mineral density in the next 1 to 2 weeks.  Return to clinic in 3 months for routine evaluation.   I spent a total of 30 minutes face-to-face with the patient of which greater than 50% of the visit was spent in counseling and coordination of care as detailed  above.   Patient expressed understanding and was in agreement with this plan. She also understands that She can call clinic at any time with any questions, concerns, or complaints.   Cancer Staging Malignant neoplasm of upper-outer quadrant of right breast in female, estrogen receptor positive ([place]) Staging form: Breast, AJCC 8th Edition - Clinical stage from 11/06/2018: Stage IA (cT1a, cN0, cM0, G1, ER+, PR+, HER2-) - Signed by [name], MD on 11/06/2018   [name], MD   12/22/2018 4:17 PM

## 2018-12-22 ENCOUNTER — Other Ambulatory Visit: Payer: Self-pay

## 2018-12-22 ENCOUNTER — Ambulatory Visit
Admission: RE | Admit: 2018-12-22 | Discharge: 2018-12-22 | Disposition: A | Discharge status: Home / Self Care | Payer: Medicare Other | Source: Ambulatory Visit | Attending: Radiation Oncology | Admitting: Radiation Oncology

## 2018-12-22 ENCOUNTER — Inpatient Hospital Stay: Payer: Medicare Other | Attending: Oncology | Admitting: Oncology

## 2018-12-22 ENCOUNTER — Encounter: Payer: Self-pay | Admitting: Radiation Oncology

## 2018-12-22 VITALS — BP 144/83 | HR 70 | Temp 97.4°F

## 2018-12-22 DIAGNOSIS — C50411 Malignant neoplasm of upper-outer quadrant of right female breast: Secondary | ICD-10-CM | POA: Insufficient documentation

## 2018-12-22 DIAGNOSIS — I10 Essential (primary) hypertension: Secondary | ICD-10-CM | POA: Insufficient documentation

## 2018-12-22 DIAGNOSIS — Z170 Estrogen receptor positive status [ER+]: Secondary | ICD-10-CM | POA: Insufficient documentation

## 2018-12-22 DIAGNOSIS — Z7902 Long term (current) use of antithrombotics/antiplatelets: Secondary | ICD-10-CM | POA: Insufficient documentation

## 2018-12-22 DIAGNOSIS — Z79811 Long term (current) use of aromatase inhibitors: Secondary | ICD-10-CM | POA: Diagnosis not present

## 2018-12-22 DIAGNOSIS — Z8673 Personal history of transient ischemic attack (TIA), and cerebral infarction without residual deficits: Secondary | ICD-10-CM | POA: Insufficient documentation

## 2018-12-22 DIAGNOSIS — K219 Gastro-esophageal reflux disease without esophagitis: Secondary | ICD-10-CM | POA: Diagnosis not present

## 2018-12-22 DIAGNOSIS — Z7982 Long term (current) use of aspirin: Secondary | ICD-10-CM | POA: Insufficient documentation

## 2018-12-22 DIAGNOSIS — E785 Hyperlipidemia, unspecified: Secondary | ICD-10-CM | POA: Insufficient documentation

## 2018-12-22 DIAGNOSIS — Z79899 Other long term (current) drug therapy: Secondary | ICD-10-CM | POA: Insufficient documentation

## 2018-12-22 DIAGNOSIS — Z950 Presence of cardiac pacemaker: Secondary | ICD-10-CM | POA: Insufficient documentation

## 2018-12-22 DIAGNOSIS — Z87891 Personal history of nicotine dependence: Secondary | ICD-10-CM | POA: Insufficient documentation

## 2018-12-22 MED ORDER — LETROZOLE 2.5 MG PO TABS: 2.5000 mg | ORAL_TABLET | Freq: Every day | ORAL | 11 refills | Status: DC

## 2018-12-22 NOTE — Progress Notes (Signed)
Radiation Oncology Follow up Note  Name: [name]   Date:   12/22/2018 MRN:  [id number] DOB: May 27, 1933    This 85 y.o. female presents to the clinic today for follow-up status post lumpectomy for stage Ia.  ER PR positive HER-2 negative invasive mammary carcinoma  REFERRING PROVIDER: Philmore Pali, NP  HPI: Patient was originally consulted and went on to have a lumpectomy for a.  4 mm grade 1 invasive mammary carcinoma.  Margins were clear at 1 cm.  Tumor was ER PR positive HER-2/neu negative.  2 sentinel lymph nodes were negative.  I discussed prior to her surgery that if she had a complete resection with clear margins she would not need adjuvant radiation therapy based on her overall comorbidities and advanced age.  She is doing well postoperatively specifically denies any breast pain or tenderness.  COMPLICATIONS OF TREATMENT: none  FOLLOW UP COMPLIANCE: keeps appointments   PHYSICAL EXAM:  BP (!) 144/83   Pulse 70   Temp (!) 97.4 F (36.3 C)  Well-developed well-nourished patient in NAD. HEENT reveals PERLA, EOMI, discs not visualized.  Oral cavity is clear. No oral mucosal lesions are identified. Neck is clear without evidence of cervical or supraclavicular adenopathy. Lungs are clear to A&P. Cardiac examination is essentially unremarkable with regular rate and rhythm without murmur rub or thrill. Abdomen is benign with no organomegaly or masses noted. Motor sensory and DTR levels are equal and symmetric in the upper and lower extremities. Cranial nerves II through XII are grossly intact. Proprioception is intact. No peripheral adenopathy or edema is identified. No motor or sensory levels are noted. Crude visual fields are within normal range.  RADIOLOGY RESULTS: No current films for review  PLAN: Present time I believe the patient is adequately treated with lumpectomy and clear margins at 1 cm.  I will refer her care and follow-up back to Dr. Tollie Pizza.  I would be happy to  reevaluate the patient anytime the future should further radiation oncology consultation be indicated.  Patient comprehends my recommendations well.  I would like to take this opportunity to thank you for allowing me to participate in the care of your patient.Noreene Filbert, MD

## 2018-12-24 ENCOUNTER — Encounter: Payer: Self-pay | Admitting: Urology

## 2018-12-24 ENCOUNTER — Other Ambulatory Visit: Payer: Self-pay

## 2018-12-24 ENCOUNTER — Ambulatory Visit (INDEPENDENT_AMBULATORY_CARE_PROVIDER_SITE_OTHER): Payer: Medicare Other | Admitting: Urology

## 2018-12-24 VITALS — BP 154/86 | HR 60 | Ht 62.0 in | Wt 177.0 lb

## 2018-12-24 DIAGNOSIS — N2889 Other specified disorders of kidney and ureter: Secondary | ICD-10-CM | POA: Diagnosis not present

## 2018-12-24 NOTE — Progress Notes (Signed)
12/24/2018 3:36 PM   [name] Last November 12, 1933 [id number]  Referring provider: Randel Books, FNP [address] [place],  [place] [address]  Chief Complaint  Patient presents with  . Follow-up    CT    HPI: 85 year old female who presents today for follow-up of incidental small renal mass.  She was initially seen virtually on 07/2018 with a 2.5 cm right anterior lower pole renal mass incidentally identified on CT scan.  She follows up today with a CT abdomen with and without contrast from 12/19/2018.  This reveals a fat-containing exophytic lesion which favors angiomyolipoma.  There is been no interval growth.  No other significant pathology identified.  She is overall unchanged.  No flank pain or gross hematuria.  PMH: Past Medical History:  Diagnosis Date  . Complete heart block ([place])   . Gait abnormality 07/23/2016  . GERD (gastroesophageal reflux disease)   . History of hypertension   . History of stroke   . Hyperhomocysteinemia ([place])   . Hyperlipidemia   . Hypertension   . Hypokalemia 07/13/2018  . Low blood magnesium 07/13/2018  . Pacemaker-Medtronic 08/07/2011  . Stroke [facility])    left sided weakness  . Vomiting 07/13/2018    Surgical History: Past Surgical History:  Procedure Laterality Date  . BIOPSY  07/15/2018   Procedure: BIOPSY;  Surgeon: [name], MD;  Location: [facility];  Service: Gastroenterology;;  . BREAST BIOPSY Right 10/27/2018   affirm bx of mass, x clip,  INVASIVE MAMMARY CARCINOMA  . BREAST LUMPECTOMY Right 12/03/2018  . CARDIAC CATHETERIZATION    . ESOPHAGOGASTRODUODENOSCOPY Left 07/15/2018   Procedure: ESOPHAGOGASTRODUODENOSCOPY (EGD);  Surgeon: [name], MD;  Location: [facility];  Service: Gastroenterology;  Laterality: Left;  . PACEMAKER INSERTION     Medtronic Enpulse dual-chamber pacemaker  . PARTIAL MASTECTOMY WITH NEEDLE LOCALIZATION AND AXILLARY SENTINEL LYMPH NODE BX Right 12/03/2018   Procedure: PARTIAL  MASTECTOMY WITH NEEDLE LOCALIZATION AND AXILLARY SENTINEL LYMPH NODE BX, RIGHT;  Surgeon: [name], MD;  Location: [facility] ORS;  Service: General;  Laterality: Right;  . PERMANENT PACEMAKER GENERATOR CHANGE N/A 07/30/2013   Procedure: PERMANENT PACEMAKER GENERATOR CHANGE;  Surgeon: [name], MD;  Location: [facility] CATH LAB;  Service: Cardiovascular;  Laterality: N/A;    Home Medications:  Allergies as of 12/24/2018   No Known Allergies     Medication List       Accurate as of December 24, 2018 11:59 PM. If you have any questions, ask your nurse or doctor.        STOP taking these medications   hydrochlorothiazide 25 MG tablet Commonly known as: HYDRODIURIL Stopped by: Hollice Espy, MD   letrozole 2.5 MG tablet Commonly known as: FEMARA Stopped by: Hollice Espy, MD     TAKE these medications   acetaminophen 500 MG tablet Commonly known as: TYLENOL Take 500-1,000 mg by mouth every 6 (six) hours as needed for moderate pain.   amLODipine 5 MG tablet Commonly known as: NORVASC Take 5 mg by mouth daily.   aspirin 81 MG EC tablet Take 81 mg by mouth daily.   Blood Pressure Kit Automated blood pressure measuring device.   clopidogrel 75 MG tablet Commonly known as: PLAVIX Take 75 mg by mouth daily with breakfast.   Lumigan 0.01 % Soln Generic drug: bimatoprost Place 1 drop into both eyes at bedtime.   mirtazapine 7.5 MG tablet Commonly known as: REMERON Take 7.5 mg by mouth at bedtime.   pantoprazole  40 MG tablet Commonly known as: PROTONIX Take 40 mg by mouth daily.   Potassium 99 MG Tabs Take 99 mg by mouth daily.   simvastatin 40 MG tablet Commonly known as: ZOCOR Take 1 tablet (40 mg total) by mouth at bedtime. Please make overdue appt with Dr. Caryl Comes before anymore refills. 1st attempt What changed: additional instructions       Allergies: No Known Allergies  Family History: Family History  Problem Relation Age of Onset  . Hypertension  Mother   . Colon cancer Neg Hx   . Esophageal cancer Neg Hx     Social History:  reports that she quit smoking about 12 years ago. She has never used smokeless tobacco. She reports that she does not drink alcohol or use drugs.  ROS: UROLOGY Frequent Urination?: No Hard to postpone urination?: No Burning/pain with urination?: No Get up at night to urinate?: No Leakage of urine?: No Urine stream starts and stops?: No Trouble starting stream?: No Do you have to strain to urinate?: No Blood in urine?: No Urinary tract infection?: No Sexually transmitted disease?: No Injury to kidneys or bladder?: No Painful intercourse?: No Weak stream?: No Currently pregnant?: No Vaginal bleeding?: No Last menstrual period?: n  Gastrointestinal Nausea?: No Vomiting?: No Indigestion/heartburn?: No Diarrhea?: No Constipation?: No  Constitutional Fever: No Night sweats?: No Weight loss?: No Fatigue?: No  Skin Skin rash/lesions?: No Itching?: No  Eyes Blurred vision?: No Double vision?: No  Ears/Nose/Throat Sore throat?: No Sinus problems?: No  Hematologic/Lymphatic Swollen glands?: No Easy bruising?: No  Cardiovascular Leg swelling?: No Chest pain?: No  Respiratory Cough?: No Shortness of breath?: No  Endocrine Excessive thirst?: No  Musculoskeletal Back pain?: No Joint pain?: No  Neurological Headaches?: No Dizziness?: No  Psychologic Depression?: No Anxiety?: No  Physical Exam: BP (!) 154/86   Pulse 60   Ht _0  (1.575 m)   Wt 177 lb (80.3 kg)   BMI 32.37 kg/m   Constitutional:  Alert and oriented, No acute distress.  In wheel chair. HEENT: [place] AT, moist mucus membranes.  Trachea midline, no masses. Cardiovascular: No clubbing, cyanosis, or edema. Respiratory: Normal respiratory effort, no increased work of breathing. GI: Abdomen is soft, obese.   Skin: No rashes, bruises or suspicious lesions. Neurologic: Grossly intact, no focal deficits,  moving all 4 extremities. Psychiatric: Normal mood and affect.  Laboratory Data: Lab Results  Component Value Date   WBC 4.5 07/24/2018   HGB 13.9 12/03/2018   HCT 41.0 12/03/2018   MCV 91.0 07/24/2018   PLT 235 07/24/2018    Lab Results  Component Value Date   CREATININE 0.82 07/24/2018    Pertinent Imaging: CLINICAL DATA:  Renal mass follow-up.  CT exam July 20, 2018  EXAM: CT ABDOMEN WITHOUT AND WITH CONTRAST  TECHNIQUE: Multidetector CT imaging of the abdomen was performed following the standard protocol before and following the bolus administration of intravenous contrast.  CONTRAST:  11m OMNIPAQUE IOHEXOL 300 MG/ML  SOLN  COMPARISON:  CT July 20, 2018  FINDINGS: Lower chest:  Lung bases are clear.  Hepatobiliary: No focal hepatic lesion. Several 1 cm gallstones within lumen gallbladder. No gallbladder inflammation. Common bile duct.  Pancreas: Normal pancreatic parenchymal intensity. No ductal dilatation or inflammation.  Spleen: Normal spleen.  Adrenals/urinary tract: Adrenal glands are normal.  Rounded mass exophytic from the lower pole of the RIGHT kidney measures two cm. Noncontrast imaging demonstrates low-density within lesion consistent with macroscopic fat (HU equal -36). This finding  is most consistent with angiomyolipoma. On the post-contrast imaging there is central enhancement of this fat containing lesion. Lesion lesion not changed in size from 2.3 cm on 07/20/2018.  Additional bilateral nonenhancing cystic lesions within LEFT and RIGHT kidney.  Stomach/Bowel: Stomach and limited of the small bowel is unremarkable  Vascular/Lymphatic: Abdominal aorta is normal caliber with atherosclerotic calcification. There is no retroperitoneal or periportal lymphadenopathy. No pelvic lymphadenopathy.  Musculoskeletal: No aggressive osseous lesion  IMPRESSION: 1. Fat containing round lesion partially exophytic in the lower  pole RIGHT kidney. 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 No growth compared to 09/19/2018. 2. Additional Bosniak 1 renal cysts of LEFT and RIGHT kidneys. 3. Cholelithiasis evidence cholecystitis.   Electronically Signed   By: Suzy Bouchard M.D.   On: 12/19/2018 14:57  CT scan was personally reviewed.  Agree with radiologic interpretation.  Assessment & Plan:    1. Right renal mass Incidental small renal mass, stable from previous imaging which is reassuring  Most recent scan with evidence of macroscopic fat which further suggest benign etiology such as angiomyolipoma.  As a precaution, would recommend follow-up with renal ultrasound in 1 year to ensure stability.  At that point, if the lesion is stable, will likely discontinue following.  She is agreeable this plan. - US RENAL; Future   Return in about 1 year (around 12/24/2019) for RUS.  Hollice Espy, MD  [facility] [address], [place], [place] [address] [phone]

## 2018-12-31 ENCOUNTER — Ambulatory Visit
Admission: RE | Admit: 2018-12-31 | Discharge: 2018-12-31 | Disposition: A | Discharge status: Home / Self Care | Payer: Medicare Other | Source: Ambulatory Visit | Attending: Oncology | Admitting: Oncology

## 2018-12-31 DIAGNOSIS — Z170 Estrogen receptor positive status [ER+]: Secondary | ICD-10-CM | POA: Diagnosis present

## 2018-12-31 DIAGNOSIS — C50411 Malignant neoplasm of upper-outer quadrant of right female breast: Secondary | ICD-10-CM

## 2018-12-31 DIAGNOSIS — M810 Age-related osteoporosis without current pathological fracture: Secondary | ICD-10-CM | POA: Diagnosis not present

## 2019-01-01 ENCOUNTER — Ambulatory Visit (INDEPENDENT_AMBULATORY_CARE_PROVIDER_SITE_OTHER): Payer: Medicare Other | Admitting: *Deleted

## 2019-01-01 DIAGNOSIS — I471 Supraventricular tachycardia: Secondary | ICD-10-CM

## 2019-01-01 LAB — CUP PACEART REMOTE DEVICE CHECK
Battery Impedance: 707 Ohm
Battery Remaining Longevity: 54 mo
Battery Voltage: 2.77 V
Brady Statistic AP VP Percent: 41 %
Brady Statistic AP VS Percent: 0 %
Brady Statistic AS VP Percent: 59 %
Brady Statistic AS VS Percent: 0 %
Date Time Interrogation Session: 20200903145203
Implantable Lead Implant Date: 20060525
Implantable Lead Implant Date: 20060525
Implantable Lead Location: 753859
Implantable Lead Location: 753860
Implantable Lead Model: 5076
Implantable Lead Model: 5076
Implantable Pulse Generator Implant Date: 20150402
Lead Channel Impedance Value: 459 Ohm
Lead Channel Impedance Value: 486 Ohm
Lead Channel Pacing Threshold Amplitude: 0.625 V
Lead Channel Pacing Threshold Amplitude: 1.5 V
Lead Channel Pacing Threshold Pulse Width: 0.4 ms
Lead Channel Pacing Threshold Pulse Width: 0.4 ms
Lead Channel Setting Pacing Amplitude: 2 V
Lead Channel Setting Pacing Amplitude: 3.5 V
Lead Channel Setting Pacing Pulse Width: 0.52 ms
Lead Channel Setting Sensing Sensitivity: 2 mV

## 2019-01-15 ENCOUNTER — Encounter: Payer: Self-pay | Admitting: Cardiology

## 2019-01-15 NOTE — Progress Notes (Signed)
Remote pacemaker transmission.   

## 2019-02-26 ENCOUNTER — Other Ambulatory Visit: Payer: Self-pay | Admitting: Internal Medicine

## 2019-02-26 MED ORDER — SIMVASTATIN 40 MG PO TABS: 40.0000 mg | ORAL_TABLET | Freq: Every day | ORAL | 9 refills | Status: DC

## 2019-04-05 NOTE — Progress Notes (Signed)
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  INTERVAL HISTORY: Patient returns to clinic today for routine 73-monthevaluation.  She is tolerating letrozole well without significant side effects. She currently feels well and is asymptomatic.  She has no neurologic complaints.  She denies any recent fevers or illnesses.  She has a good appetite and denies weight loss.  She has no chest pain, shortness of breath, cough, or hemoptysis.  She denies any nausea, vomiting, constipation, or diarrhea.  She has no urinary complaints.  Patient offers no specific complaints today.  REVIEW OF SYSTEMS:   Review of Systems  Constitutional: Negative.  Negative for fever, malaise/fatigue and weight loss.  Respiratory: Negative.  Negative for cough, hemoptysis and shortness of breath.   Cardiovascular: Negative.  Negative for chest pain and leg swelling.  Gastrointestinal: Negative.  Negative for abdominal pain.  Genitourinary: Negative.  Negative for dysuria.  Musculoskeletal: Negative.  Negative for back pain.  Skin: Negative.  Negative for rash.  Neurological: Negative.  Negative for dizziness, focal weakness, weakness and headaches.  Psychiatric/Behavioral: Negative.  The patient is not nervous/anxious.     As per HPI. Otherwise, a complete review of systems is negative.  PAST MEDICAL HISTORY: Past Medical History:  Diagnosis Date  . Complete heart block ([place]   . Gait abnormality 07/23/2016  . GERD (gastroesophageal reflux disease)   .  History of hypertension   . History of stroke   . Hyperhomocysteinemia ([place]   . Hyperlipidemia   . Hypertension   . Hypokalemia 07/13/2018  . Low blood magnesium 07/13/2018  . Pacemaker-Medtronic 08/07/2011  . Stroke ([facility]    left sided weakness  . Vomiting 07/13/2018    PAST SURGICAL HISTORY: Past Surgical History:  Procedure Laterality Date  . BIOPSY  07/15/2018   Procedure: BIOPSY;  Surgeon: GGatha Mayer MD;  [facility];  Service: Gastroenterology;;  . BREAST BIOPSY Right 10/27/2018   affirm bx of mass, x clip,  INVASIVE MAMMARY CARCINOMA  . BREAST LUMPECTOMY Right 12/03/2018  . CARDIAC CATHETERIZATION    . ESOPHAGOGASTRODUODENOSCOPY Left 07/15/2018   Procedure: ESOPHAGOGASTRODUODENOSCOPY (EGD);  Surgeon: GGatha Mayer MD;  [facility];  Service: Gastroenterology;  Laterality: Left;  . PACEMAKER INSERTION     Medtronic Enpulse dual-chamber pacemaker  . PARTIAL MASTECTOMY WITH NEEDLE LOCALIZATION AND AXILLARY SENTINEL LYMPH NODE BX Right 12/03/2018   Procedure: PARTIAL MASTECTOMY WITH NEEDLE LOCALIZATION AND AXILLARY SENTINEL LYMPH NODE BX, RIGHT;  Surgeon: CHerbert Pun MD;  Location: [facility] ORS;  Service: General;  Laterality: Right;  . PERMANENT PACEMAKER GENERATOR CHANGE N/A 07/30/2013   Procedure: PERMANENT PACEMAKER GENERATOR CHANGE;  Surgeon: SDeboraha Sprang MD;  [facility];  Service: Cardiovascular;  Laterality: N/A;    FAMILY HISTORY: Family History  Problem Relation Age of Onset  . Hypertension Mother   . Colon cancer Neg Hx   . Esophageal cancer Neg Hx     ADVANCED DIRECTIVES (Y/N):  N  HEALTH MAINTENANCE: Social History   Tobacco Use  . Smoking status: Former Smoker    Quit date:  07/25/2006    Years since quitting: 12.7  . Smokeless tobacco: Never Used  Substance Use Topics  . Alcohol use: No  . Drug use: No     Colonoscopy:  PAP:  Bone density:  Lipid panel:  No Known Allergies  Current Outpatient  Medications  Medication Sig Dispense Refill  . acetaminophen (TYLENOL) 500 MG tablet Take 500-1,000 mg by mouth every 6 (six) hours as needed for moderate pain.     [name] amLODipine (NORVASC) 5 MG tablet Take 5 mg by mouth daily.    [name] aspirin 81 MG EC tablet Take 81 mg by mouth daily.     . Blood Pressure KIT Automated blood pressure measuring device. 1 each 0  . clopidogrel (PLAVIX) 75 MG tablet Take 75 mg by mouth daily with breakfast.    . LUMIGAN 0.01 % SOLN Place 1 drop into both eyes at bedtime.  4  . mirtazapine (REMERON) 7.5 MG tablet Take 7.5 mg by mouth at bedtime.     . pantoprazole (PROTONIX) 40 MG tablet Take 40 mg by mouth daily.    . Potassium 99 MG TABS Take 99 mg by mouth daily.    . simvastatin (ZOCOR) 40 MG tablet Take 1 tablet (40 mg total) by mouth at bedtime. 30 tablet 9  . alendronate (FOSAMAX) 70 MG tablet Take 1 tablet (70 mg total) by mouth once a week. Take with a full glass of water on an empty stomach. 4 tablet 6   No current facility-administered medications for this visit.    OBJECTIVE: Vitals:   04/09/19 1009  BP: (!) 161/81  Pulse: 73  Resp: 18  Temp: 98.8 F (37.1 C)  SpO2: 99%     Body mass index is 33.4 kg/m.    ECOG FS:0 - Asymptomatic  General: Well-developed, well-nourished, no acute distress. Eyes: Pink conjunctiva, anicteric sclera. HEENT: Normocephalic, moist mucous membranes. Breast: Well-healed surgical scar on right breast.  Exam deferred today. Lungs: No audible wheezing or coughing. Heart: Regular rate and rhythm. Abdomen: Soft, nontender, no obvious distention. Musculoskeletal: No edema, cyanosis, or clubbing. Neuro: Alert, answering all questions appropriately. Cranial nerves grossly intact. Skin: No rashes or petechiae noted. Psych: Normal affect.  LAB RESULTS:  Lab Results  Component Value Date   NA 144 12/03/2018   K 3.1 (L) 12/03/2018   CL 109 07/24/2018   CO2 20 (L) 07/24/2018   GLUCOSE 93 12/03/2018   BUN 6 (L)  07/24/2018   CREATININE 0.82 07/24/2018   CALCIUM 7.6 (L) 07/24/2018   PROT 4.9 (L) 07/24/2018   ALBUMIN 1.9 (L) 07/24/2018   AST 14 (L) 07/24/2018   ALT 9 07/24/2018   ALKPHOS 52 07/24/2018   BILITOT 0.7 07/24/2018   GFRNONAA >60 07/24/2018   GFRAA >60 07/24/2018    Lab Results  Component Value Date   WBC 4.5 07/24/2018   NEUTROABS 6.6 07/13/2018   HGB 13.9 12/03/2018   HCT 41.0 12/03/2018   MCV 91.0 07/24/2018   PLT 235 07/24/2018     STUDIES: No results found.  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  PLAN:    1.  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  Given her advanced age and small size of malignancy, Oncotype DX or adjuvant chemotherapy was not necessary.  She was also evaluated by  radiation oncology who determined that adjuvant XRT was also not needed.  She was initiated on letrozole and will complete 5 years of treatment in August 2025.  Patient will require repeat mammogram in August 2021.  Return to clinic in 3 months for further evaluation. 2.  Osteoporosis: Patient had a baseline bone mineral density on December 31, 2018 which revealed a T score of -2.8.  She is tolerating letrozole well so we will continue this medication as prescribed.  Patient was given a prescription for Fosamax today and instructed to take over-the-counter calcium and vitamin D supplementation.  3.  Hypertension: Patient's blood pressure is mildly elevated today.  Continue monitoring and treatment per primary care.   Patient expressed understanding and was in agreement with this plan. She also understands that She can call clinic at any time with any questions, concerns, or complaints.   Cancer Staging Malignant neoplasm of upper-outer quadrant of right breast in female,  estrogen receptor positive ([place]) Staging form: Breast, AJCC 8th Edition - Clinical stage from 11/06/2018: Stage IA (cT1a, cN0, cM0, G1, ER+, PR+, HER2-) - Signed by [name], MD on 11/06/2018   [name], MD   04/09/2019 6:47 PM

## 2019-04-06 ENCOUNTER — Telehealth: Payer: Self-pay | Admitting: Oncology

## 2019-04-06 NOTE — Telephone Encounter (Signed)
Created in error

## 2019-04-08 ENCOUNTER — Other Ambulatory Visit: Payer: Self-pay

## 2019-04-08 NOTE — Progress Notes (Signed)
Patient unable to hear on the phone sister supplied chart information.

## 2019-04-09 ENCOUNTER — Inpatient Hospital Stay: Payer: Medicare Other | Attending: Oncology | Admitting: Oncology

## 2019-04-09 ENCOUNTER — Other Ambulatory Visit: Payer: Self-pay

## 2019-04-09 VITALS — BP 161/81 | HR 73 | Temp 98.8°F | Resp 18 | Wt 182.6 lb

## 2019-04-09 DIAGNOSIS — E785 Hyperlipidemia, unspecified: Secondary | ICD-10-CM | POA: Diagnosis not present

## 2019-04-09 DIAGNOSIS — Z79811 Long term (current) use of aromatase inhibitors: Secondary | ICD-10-CM | POA: Diagnosis not present

## 2019-04-09 DIAGNOSIS — M810 Age-related osteoporosis without current pathological fracture: Secondary | ICD-10-CM | POA: Diagnosis not present

## 2019-04-09 DIAGNOSIS — C50411 Malignant neoplasm of upper-outer quadrant of right female breast: Secondary | ICD-10-CM | POA: Diagnosis not present

## 2019-04-09 DIAGNOSIS — K219 Gastro-esophageal reflux disease without esophagitis: Secondary | ICD-10-CM | POA: Diagnosis not present

## 2019-04-09 DIAGNOSIS — Z7982 Long term (current) use of aspirin: Secondary | ICD-10-CM | POA: Diagnosis not present

## 2019-04-09 DIAGNOSIS — Z87891 Personal history of nicotine dependence: Secondary | ICD-10-CM | POA: Diagnosis not present

## 2019-04-09 DIAGNOSIS — I10 Essential (primary) hypertension: Secondary | ICD-10-CM | POA: Diagnosis not present

## 2019-04-09 DIAGNOSIS — Z170 Estrogen receptor positive status [ER+]: Secondary | ICD-10-CM | POA: Diagnosis not present

## 2019-04-09 DIAGNOSIS — Z79899 Other long term (current) drug therapy: Secondary | ICD-10-CM | POA: Diagnosis not present

## 2019-04-09 DIAGNOSIS — Z950 Presence of cardiac pacemaker: Secondary | ICD-10-CM | POA: Diagnosis not present

## 2019-04-09 DIAGNOSIS — Z7902 Long term (current) use of antithrombotics/antiplatelets: Secondary | ICD-10-CM | POA: Diagnosis not present

## 2019-04-09 DIAGNOSIS — Z8673 Personal history of transient ischemic attack (TIA), and cerebral infarction without residual deficits: Secondary | ICD-10-CM | POA: Insufficient documentation

## 2019-04-09 MED ORDER — ALENDRONATE SODIUM 70 MG PO TABS: 70.0000 mg | ORAL_TABLET | ORAL | 6 refills | Status: DC

## 2019-04-09 NOTE — Progress Notes (Signed)
Patient is here today for a 3 month follow-up with no complaints.

## 2019-04-10 ENCOUNTER — Encounter: Payer: Self-pay | Admitting: *Deleted

## 2019-05-13 ENCOUNTER — Encounter (INDEPENDENT_AMBULATORY_CARE_PROVIDER_SITE_OTHER): Payer: Self-pay

## 2019-05-13 ENCOUNTER — Telehealth: Payer: Self-pay

## 2019-05-13 NOTE — Telephone Encounter (Signed)
Spoke with patient. Unable to troubleshoot monitor. Contacted [employer], new monitor should arrive in 7-10 business days. Pt aware, no further questions at this time.

## 2019-05-13 NOTE — Telephone Encounter (Signed)
New [employer] monitor ordered 05/13/19, should arrive in 7-10 business days. Pt aware to send transmission when she receives new monitor, or call our office for assistance.

## 2019-05-13 NOTE — Telephone Encounter (Signed)
I spoke with the pt and call [employer] for her. [employer] was trying to troubleshoot the monitor then we got disconnected. They are calling her back.

## 2019-05-18 ENCOUNTER — Ambulatory Visit (INDEPENDENT_AMBULATORY_CARE_PROVIDER_SITE_OTHER): Payer: Medicare HMO | Admitting: *Deleted

## 2019-05-18 DIAGNOSIS — I442 Atrioventricular block, complete: Secondary | ICD-10-CM

## 2019-05-18 LAB — CUP PACEART REMOTE DEVICE CHECK
Battery Impedance: 809 Ohm
Battery Remaining Longevity: 61 mo
Battery Voltage: 2.77 V
Brady Statistic AP VP Percent: 33 %
Brady Statistic AP VS Percent: 0 %
Brady Statistic AS VP Percent: 67 %
Brady Statistic AS VS Percent: 0 %
Date Time Interrogation Session: 20210118140703
Implantable Lead Implant Date: 20060525
Implantable Lead Implant Date: 20060525
Implantable Lead Location: 753859
Implantable Lead Location: 753860
Implantable Lead Model: 5076
Implantable Lead Model: 5076
Implantable Pulse Generator Implant Date: 20150402
Lead Channel Impedance Value: 465 Ohm
Lead Channel Impedance Value: 507 Ohm
Lead Channel Pacing Threshold Amplitude: 0.625 V
Lead Channel Pacing Threshold Amplitude: 1.375 V
Lead Channel Pacing Threshold Pulse Width: 0.4 ms
Lead Channel Pacing Threshold Pulse Width: 0.4 ms
Lead Channel Setting Pacing Amplitude: 2 V
Lead Channel Setting Pacing Amplitude: 2.75 V
Lead Channel Setting Pacing Pulse Width: 0.4 ms
Lead Channel Setting Sensing Sensitivity: 2 mV

## 2019-05-19 NOTE — Progress Notes (Signed)
PPM remote 

## 2019-06-25 DIAGNOSIS — M25561 Pain in right knee: Secondary | ICD-10-CM | POA: Diagnosis not present

## 2019-06-25 DIAGNOSIS — Z6833 Body mass index (BMI) 33.0-33.9, adult: Secondary | ICD-10-CM | POA: Diagnosis not present

## 2019-07-05 NOTE — Progress Notes (Signed)
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  INTERVAL HISTORY: Patient returns to clinic today for routine 2-monthevaluation.  She continues to tolerate letrozole and Fosamax well without significant side effects.  She currently feels well and is asymptomatic. She has no neurologic complaints.  She denies any recent fevers or illnesses.  She has a good appetite and denies weight loss.  She has no chest pain, shortness of breath, cough, or hemoptysis.  She denies any nausea, vomiting, constipation, or diarrhea.  She has no urinary complaints.  Patient feels at her baseline offers no specific complaints today.  REVIEW OF SYSTEMS:   Review of Systems  Constitutional: Negative.  Negative for fever, malaise/fatigue and weight loss.  Respiratory: Negative.  Negative for cough, hemoptysis and shortness of breath.   Cardiovascular: Negative.  Negative for chest pain and leg swelling.  Gastrointestinal: Negative.  Negative for abdominal pain.  Genitourinary: Negative.  Negative for dysuria.  Musculoskeletal: Negative.  Negative for back pain.  Skin: Negative.  Negative for rash.  Neurological: Negative.  Negative for dizziness, focal weakness, weakness and headaches.  Psychiatric/Behavioral: Negative.  The patient is not nervous/anxious.     As per HPI. Otherwise, a complete review of systems is negative.  PAST MEDICAL HISTORY: Past Medical History:  Diagnosis Date  . [place]   . Gait abnormality 07/23/2016  . GERD  (gastroesophageal reflux disease)   . History of hypertension   . History of stroke   . [place]   . Hyperlipidemia   . Hypertension   . Hypokalemia 07/13/2018  . Low blood magnesium 07/13/2018  . Pacemaker-Medtronic 08/07/2011  . Stroke ([facility]    left sided weakness  . Vomiting 07/13/2018    PAST SURGICAL HISTORY: Past Surgical History:  Procedure Laterality Date  . BIOPSY  07/15/2018   Procedure: BIOPSY;  Surgeon: GGatha Mayer MD;  [facility];  Service: Gastroenterology;;  . BREAST BIOPSY Right 10/27/2018   affirm bx of mass, x clip,  INVASIVE MAMMARY CARCINOMA  . BREAST LUMPECTOMY Right 12/03/2018  . CARDIAC CATHETERIZATION    . ESOPHAGOGASTRODUODENOSCOPY Left 07/15/2018   Procedure: ESOPHAGOGASTRODUODENOSCOPY (EGD);  Surgeon: GGatha Mayer MD;  [facility];  Service: Gastroenterology;  Laterality: Left;  . PACEMAKER INSERTION     Medtronic Enpulse dual-chamber pacemaker  . PARTIAL MASTECTOMY WITH NEEDLE LOCALIZATION AND AXILLARY SENTINEL LYMPH NODE BX Right 12/03/2018   Procedure: PARTIAL MASTECTOMY WITH NEEDLE LOCALIZATION AND AXILLARY SENTINEL LYMPH NODE BX, RIGHT;  Surgeon: CHerbert Pun MD;  Location: [facility] ORS;  Service: General;  Laterality: Right;  . PERMANENT PACEMAKER GENERATOR CHANGE N/A 07/30/2013   Procedure: PERMANENT PACEMAKER GENERATOR CHANGE;  Surgeon: SDeboraha Sprang MD;  [facility];  Service: Cardiovascular;  Laterality: N/A;    FAMILY HISTORY: Family History  Problem Relation Age of Onset  . Hypertension Mother   . Colon cancer Neg Hx   . Esophageal cancer Neg Hx     ADVANCED DIRECTIVES (Y/N):  N  HEALTH MAINTENANCE: Social History   Tobacco Use  . Smoking status:  Former Smoker    Quit date: 07/25/2006    Years since quitting: 12.9  . Smokeless tobacco: Never Used  Substance Use Topics  . Alcohol use: No  . Drug use: No     Colonoscopy:  PAP:  Bone density:  Lipid panel:  No Known  Allergies  Current Outpatient Medications  Medication Sig Dispense Refill  . acetaminophen (TYLENOL) 500 MG tablet Take 500-1,000 mg by mouth every 6 (six) hours as needed for moderate pain.     [name] alendronate (FOSAMAX) 70 MG tablet Take 1 tablet (70 mg total) by mouth once a week. Take with a full glass of water on an empty stomach. 4 tablet 6  . amLODipine (NORVASC) 5 MG tablet Take 5 mg by mouth daily.    [name] aspirin 81 MG EC tablet Take 81 mg by mouth daily.     . Blood Pressure KIT Automated blood pressure measuring device. 1 each 0  . clopidogrel (PLAVIX) 75 MG tablet Take 75 mg by mouth daily with breakfast.    . LUMIGAN 0.01 % SOLN Place 1 drop into both eyes at bedtime.  4  . mirtazapine (REMERON) 7.5 MG tablet Take 7.5 mg by mouth at bedtime.     . Potassium 99 MG TABS Take 99 mg by mouth daily.    . simvastatin (ZOCOR) 40 MG tablet Take 1 tablet (40 mg total) by mouth at bedtime. 30 tablet 9  . pantoprazole (PROTONIX) 40 MG tablet Take 40 mg by mouth daily.     No current facility-administered medications for this visit.    OBJECTIVE: Vitals:   07/09/19 1052  BP: (!) 142/84  Pulse: 82  Resp: 18  Temp: (!) 97.5 F (36.4 C)  SpO2: 100%     Body mass index is 33.47 kg/m.    ECOG FS:0 - Asymptomatic  General: Well-developed, well-nourished, no acute distress.  Sitting in a wheelchair. Eyes: Pink conjunctiva, anicteric sclera. HEENT: Normocephalic, moist mucous membranes. Breast: Exam deferred today. Lungs: No audible wheezing or coughing. Heart: Regular rate and rhythm. Abdomen: Soft, nontender, no obvious distention. Musculoskeletal: No edema, cyanosis, or clubbing. Neuro: Alert, answering all questions appropriately. Cranial nerves grossly intact. Skin: No rashes or petechiae noted. Psych: Normal affect.  LAB RESULTS:  Lab Results  Component Value Date   NA 144 12/03/2018   K 3.1 (L) 12/03/2018   CL 109 07/24/2018   CO2 20 (L) 07/24/2018   GLUCOSE 93  12/03/2018   BUN 6 (L) 07/24/2018   CREATININE 0.82 07/24/2018   CALCIUM 7.6 (L) 07/24/2018   PROT 4.9 (L) 07/24/2018   ALBUMIN 1.9 (L) 07/24/2018   AST 14 (L) 07/24/2018   ALT 9 07/24/2018   ALKPHOS 52 07/24/2018   BILITOT 0.7 07/24/2018   GFRNONAA >60 07/24/2018   GFRAA >60 07/24/2018    Lab Results  Component Value Date   WBC 4.5 07/24/2018   NEUTROABS 6.6 07/13/2018   HGB 13.9 12/03/2018   HCT 41.0 12/03/2018   MCV 91.0 07/24/2018   PLT 235 07/24/2018     STUDIES: No results found.  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  PLAN:    1.  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  Given her advanced age and small size of malignancy, Oncotype DX or adjuvant chemotherapy was not necessary.  She was also evaluated by radiation oncology who determined that adjuvant XRT was also not needed.  Continue letrozole for a total of 5 years completing treatment in August 2025.  She will require repeat mammogram in August 2021.  Return to clinic in 6 months for routine evaluation.   2.  Osteoporosis: Patient had a baseline bone mineral density on December 31, 2018 which revealed a T score of -2.8.  She is tolerating letrozole well so we will continue this medication as prescribed.  Continue Fosamax, calcium, and vitamin D supplementation.  Repeat in September 2021.   3.  Hypertension: Chronic and unchanged.  Continue monitoring and treatment per primary care.   Patient expressed understanding and was in agreement with this plan. She also understands that She can call clinic at any time with any questions, concerns, or complaints.   Cancer Staging Malignant neoplasm of upper-outer quadrant of right breast in female, estrogen receptor positive ([place]) Staging form: Breast,  AJCC 8th Edition - Clinical stage from 11/06/2018: Stage IA (cT1a, cN0, cM0, G1, ER+, PR+, HER2-) - Signed by [name], MD on 11/06/2018   [name], MD   07/09/2019 1:25 PM

## 2019-07-08 ENCOUNTER — Encounter: Payer: Self-pay | Admitting: Oncology

## 2019-07-09 ENCOUNTER — Inpatient Hospital Stay: Payer: Medicare Other | Attending: Oncology | Admitting: Oncology

## 2019-07-09 ENCOUNTER — Other Ambulatory Visit: Payer: Self-pay

## 2019-07-09 VITALS — BP 142/84 | HR 82 | Temp 97.5°F | Resp 18 | Wt 183.0 lb

## 2019-07-09 DIAGNOSIS — Z8673 Personal history of transient ischemic attack (TIA), and cerebral infarction without residual deficits: Secondary | ICD-10-CM | POA: Insufficient documentation

## 2019-07-09 DIAGNOSIS — M810 Age-related osteoporosis without current pathological fracture: Secondary | ICD-10-CM | POA: Diagnosis not present

## 2019-07-09 DIAGNOSIS — I10 Essential (primary) hypertension: Secondary | ICD-10-CM | POA: Diagnosis not present

## 2019-07-09 DIAGNOSIS — Z79899 Other long term (current) drug therapy: Secondary | ICD-10-CM | POA: Insufficient documentation

## 2019-07-09 DIAGNOSIS — Z170 Estrogen receptor positive status [ER+]: Secondary | ICD-10-CM | POA: Diagnosis not present

## 2019-07-09 DIAGNOSIS — Z79811 Long term (current) use of aromatase inhibitors: Secondary | ICD-10-CM | POA: Diagnosis not present

## 2019-07-09 DIAGNOSIS — Z87891 Personal history of nicotine dependence: Secondary | ICD-10-CM | POA: Diagnosis not present

## 2019-07-09 DIAGNOSIS — E785 Hyperlipidemia, unspecified: Secondary | ICD-10-CM | POA: Insufficient documentation

## 2019-07-09 DIAGNOSIS — Z950 Presence of cardiac pacemaker: Secondary | ICD-10-CM | POA: Diagnosis not present

## 2019-07-09 DIAGNOSIS — C50411 Malignant neoplasm of upper-outer quadrant of right female breast: Secondary | ICD-10-CM

## 2019-07-09 DIAGNOSIS — K219 Gastro-esophageal reflux disease without esophagitis: Secondary | ICD-10-CM | POA: Insufficient documentation

## 2019-07-23 DIAGNOSIS — Z9181 History of falling: Secondary | ICD-10-CM | POA: Diagnosis not present

## 2019-07-23 DIAGNOSIS — E785 Hyperlipidemia, unspecified: Secondary | ICD-10-CM | POA: Diagnosis not present

## 2019-07-23 DIAGNOSIS — Z0000 Encounter for general adult medical examination without abnormal findings: Secondary | ICD-10-CM | POA: Diagnosis not present

## 2019-08-13 DIAGNOSIS — H401131 Primary open-angle glaucoma, bilateral, mild stage: Secondary | ICD-10-CM | POA: Diagnosis not present

## 2019-08-17 ENCOUNTER — Ambulatory Visit (INDEPENDENT_AMBULATORY_CARE_PROVIDER_SITE_OTHER): Payer: Medicare Other | Admitting: *Deleted

## 2019-08-17 DIAGNOSIS — I442 Atrioventricular block, complete: Secondary | ICD-10-CM

## 2019-08-17 LAB — CUP PACEART REMOTE DEVICE CHECK
Battery Impedance: 940 Ohm
Battery Remaining Longevity: 60 mo
Battery Voltage: 2.76 V
Brady Statistic AP VP Percent: 32 %
Brady Statistic AP VS Percent: 0 %
Brady Statistic AS VP Percent: 68 %
Brady Statistic AS VS Percent: 0 %
Date Time Interrogation Session: 20210419080236
Implantable Lead Implant Date: 20060525
Implantable Lead Implant Date: 20060525
Implantable Lead Location: 753859
Implantable Lead Location: 753860
Implantable Lead Model: 5076
Implantable Lead Model: 5076
Implantable Pulse Generator Implant Date: 20150402
Lead Channel Impedance Value: 472 Ohm
Lead Channel Impedance Value: 502 Ohm
Lead Channel Pacing Threshold Amplitude: 0.625 V
Lead Channel Pacing Threshold Amplitude: 1.25 V
Lead Channel Pacing Threshold Pulse Width: 0.4 ms
Lead Channel Pacing Threshold Pulse Width: 0.4 ms
Lead Channel Setting Pacing Amplitude: 2 V
Lead Channel Setting Pacing Amplitude: 2.5 V
Lead Channel Setting Pacing Pulse Width: 0.4 ms
Lead Channel Setting Sensing Sensitivity: 2 mV

## 2019-08-18 NOTE — Progress Notes (Signed)
PPM Remote  

## 2019-09-13 ENCOUNTER — Other Ambulatory Visit: Payer: Self-pay | Admitting: Oncology

## 2019-09-29 ENCOUNTER — Other Ambulatory Visit: Payer: Self-pay | Admitting: Oncology

## 2019-10-16 ENCOUNTER — Other Ambulatory Visit: Payer: Medicare Other

## 2019-10-28 DIAGNOSIS — J069 Acute upper respiratory infection, unspecified: Secondary | ICD-10-CM | POA: Diagnosis not present

## 2019-11-13 ENCOUNTER — Ambulatory Visit
Admission: RE | Admit: 2019-11-13 | Discharge: 2019-11-13 | Disposition: A | Discharge status: Home / Self Care | Payer: Medicare Other | Source: Ambulatory Visit | Attending: Oncology | Admitting: Oncology

## 2019-11-13 DIAGNOSIS — Z170 Estrogen receptor positive status [ER+]: Secondary | ICD-10-CM | POA: Diagnosis not present

## 2019-11-13 DIAGNOSIS — R928 Other abnormal and inconclusive findings on diagnostic imaging of breast: Secondary | ICD-10-CM | POA: Diagnosis not present

## 2019-11-13 DIAGNOSIS — C50411 Malignant neoplasm of upper-outer quadrant of right female breast: Secondary | ICD-10-CM

## 2019-11-16 ENCOUNTER — Ambulatory Visit (INDEPENDENT_AMBULATORY_CARE_PROVIDER_SITE_OTHER): Payer: Medicare Other | Admitting: *Deleted

## 2019-11-16 DIAGNOSIS — I495 Sick sinus syndrome: Secondary | ICD-10-CM | POA: Diagnosis not present

## 2019-11-16 LAB — CUP PACEART REMOTE DEVICE CHECK
Battery Impedance: 1020 Ohm
Battery Remaining Longevity: 50 mo
Battery Voltage: 2.75 V
Brady Statistic AP VP Percent: 28 %
Brady Statistic AP VS Percent: 0 %
Brady Statistic AS VP Percent: 72 %
Brady Statistic AS VS Percent: 0 %
Date Time Interrogation Session: 20210719105257
Implantable Lead Implant Date: 20060525
Implantable Lead Implant Date: 20060525
Implantable Lead Location: 753859
Implantable Lead Location: 753860
Implantable Lead Model: 5076
Implantable Lead Model: 5076
Implantable Pulse Generator Implant Date: 20150402
Lead Channel Impedance Value: 472 Ohm
Lead Channel Impedance Value: 508 Ohm
Lead Channel Pacing Threshold Amplitude: 0.75 V
Lead Channel Pacing Threshold Amplitude: 1 V
Lead Channel Pacing Threshold Pulse Width: 0.4 ms
Lead Channel Pacing Threshold Pulse Width: 0.4 ms
Lead Channel Setting Pacing Amplitude: 2 V
Lead Channel Setting Pacing Amplitude: 3.25 V
Lead Channel Setting Pacing Pulse Width: 0.4 ms
Lead Channel Setting Sensing Sensitivity: 2 mV

## 2019-11-18 NOTE — Progress Notes (Signed)
Remote pacemaker transmission.   

## 2019-11-24 ENCOUNTER — Telehealth: Payer: Self-pay | Admitting: Internal Medicine

## 2019-11-24 MED ORDER — SIMVASTATIN 40 MG PO TABS: 40.0000 mg | ORAL_TABLET | Freq: Every day | ORAL | 0 refills | Status: DC

## 2019-11-24 NOTE — Telephone Encounter (Signed)
*  STAT* If patient is at the pharmacy, call can be transferred to refill team.   1. Which medications need to be refilled? (please list name of each medication and dose if known)   simvastatin (ZOCOR) 40 MG tablet     2. Which pharmacy/location (including street and city if local pharmacy) is medication to be sent to? [id number] - [place], [place] - [address]  3. Do they need a 30 day or 90 day supply? 90 day supply

## 2019-11-24 NOTE — Telephone Encounter (Signed)
Pt's medication was sent to pt's pharmacy as requested. Confirmation received.  °

## 2019-11-26 DIAGNOSIS — C50411 Malignant neoplasm of upper-outer quadrant of right female breast: Secondary | ICD-10-CM | POA: Diagnosis not present

## 2019-11-26 DIAGNOSIS — Z170 Estrogen receptor positive status [ER+]: Secondary | ICD-10-CM | POA: Diagnosis not present

## 2019-12-21 ENCOUNTER — Ambulatory Visit
Admission: RE | Admit: 2019-12-21 | Discharge: 2019-12-21 | Disposition: A | Discharge status: Home / Self Care | Payer: Medicare Other | Source: Ambulatory Visit | Attending: Urology | Admitting: Urology

## 2019-12-21 ENCOUNTER — Other Ambulatory Visit: Payer: Self-pay

## 2019-12-21 DIAGNOSIS — N281 Cyst of kidney, acquired: Secondary | ICD-10-CM | POA: Diagnosis not present

## 2019-12-21 DIAGNOSIS — N1330 Unspecified hydronephrosis: Secondary | ICD-10-CM | POA: Diagnosis not present

## 2019-12-21 DIAGNOSIS — N2889 Other specified disorders of kidney and ureter: Secondary | ICD-10-CM | POA: Diagnosis not present

## 2019-12-21 DIAGNOSIS — N289 Disorder of kidney and ureter, unspecified: Secondary | ICD-10-CM | POA: Diagnosis not present

## 2019-12-24 ENCOUNTER — Ambulatory Visit: Payer: Medicare Other | Admitting: Urology

## 2020-01-05 NOTE — Progress Notes (Signed)
01/06/2020 10:21 AM   [name] 1933-05-26 [id number]  Referring provider: Philmore Pali, NP [address] [place],  [place] [address] Chief Complaint  Patient presents with  . right renal mass    HPI: [name] is a 86 y.o. female who returns for a 1 year follow up of right renal mass and review of RUS.[name]   She was initially seen virtually on 07/2018 with a 2.5 cm right anterior lower pole renal mass incidentally identified on CT scan.  CT abdomen with and without contrast from 12/19/2018 revealed a fat-containing exophytic lesion which favors angiomyolipoma. There is been no interval growth.  No other significant pathology identified.  RUS on 12/21/2019 noted multiple bilateral renal cysts. Slightly hyperechoic solid renal lesion in the lower pole of the right kidney corresponding to the fat containing lesion seen on prior CT. While this may reflect angiomyolipoma, some renal cell carcinomas can contain macroscopic fat.   The patient is doing well. She notes nocturia. Denies kidney pain, dysuria or hematuria.    PMH: Past Medical History:  Diagnosis Date  . Complete heart block ([place])   . Gait abnormality 07/23/2016  . GERD (gastroesophageal reflux disease)   . History of hypertension   . History of stroke   . Hyperhomocysteinemia ([place])   . Hyperlipidemia   . Hypertension   . Hypokalemia 07/13/2018  . Low blood magnesium 07/13/2018  . Pacemaker-Medtronic 08/07/2011  . Stroke [facility])    left sided weakness  . Vomiting 07/13/2018    Surgical History: Past Surgical History:  Procedure Laterality Date  . BIOPSY  07/15/2018   Procedure: BIOPSY;  Surgeon: [name], MD;  [facility];  Service: Gastroenterology;;  . BREAST BIOPSY Right 10/27/2018   affirm bx of mass, x clip,  INVASIVE MAMMARY CARCINOMA  . BREAST LUMPECTOMY Right 12/03/2018  . CARDIAC CATHETERIZATION    . ESOPHAGOGASTRODUODENOSCOPY Left 07/15/2018   Procedure:  ESOPHAGOGASTRODUODENOSCOPY (EGD);  Surgeon: [name], MD;  [facility];  Service: Gastroenterology;  Laterality: Left;  . PACEMAKER INSERTION     Medtronic Enpulse dual-chamber pacemaker  . PARTIAL MASTECTOMY WITH NEEDLE LOCALIZATION AND AXILLARY SENTINEL LYMPH NODE BX Right 12/03/2018   Procedure: PARTIAL MASTECTOMY WITH NEEDLE LOCALIZATION AND AXILLARY SENTINEL LYMPH NODE BX, RIGHT;  Surgeon: [name], MD;  Location: [facility]S;  Service: General;  Laterality: Right;  . PERMANENT PACEMAKER GENERATOR CHANGE N/A 07/30/2013   Procedure: PERMANENT PACEMAKER GENERATOR CHANGE;  Surgeon: [name], MD;  Location: [facility] LAB;  Service: Cardiovascular;  Laterality: N/A;    Home Medications:  Allergies as of 01/06/2020   No Known Allergies     Medication List       Accurate as of January 06, 2020 10:21 AM. If you have any questions, ask your nurse or doctor.        acetaminophen 500 MG tablet Commonly known as: TYLENOL Take 500-1,000 mg by mouth every 6 (six) hours as needed for moderate pain.   alendronate 70 MG tablet Commonly known as: FOSAMAX TAKE 1 TABLET (70 MG TOTAL) BY MOUTH ONCE A WEEK. TAKE WITH A FULL GLASS OF WATER ON AN EMPTY STOMACH.   allopurinol 100 MG tablet Commonly known as: ZYLOPRIM Take 100 mg by mouth daily.   amLODipine 5 MG tablet Commonly known as: NORVASC Take 5 mg by mouth daily.   aspirin 81 MG EC tablet Take 81 mg by mouth daily.   Blood Pressure Kit Automated blood pressure  measuring device.   clopidogrel 75 MG tablet Commonly known as: PLAVIX Take 75 mg by mouth daily with breakfast.   hydrochlorothiazide 25 MG tablet Commonly known as: HYDRODIURIL Take 25 mg by mouth daily.   letrozole 2.5 MG tablet Commonly known as: FEMARA TAKE 1 TABLET BY MOUTH EVERY DAY   Lumigan 0.01 % Soln Generic drug: bimatoprost Place 1 drop into both eyes at bedtime.   mirtazapine 7.5 MG tablet Commonly known as: REMERON Take  7.5 mg by mouth at bedtime.   pantoprazole 40 MG tablet Commonly known as: PROTONIX Take 40 mg by mouth daily.   Potassium 99 MG Tabs Take 99 mg by mouth daily.   simvastatin 40 MG tablet Commonly known as: ZOCOR Take 1 tablet (40 mg total) by mouth at bedtime. Please make yearly appt with Dr. Caryl Comes for August before anymore refills. 1st attempt       Allergies: No Known Allergies  Family History: Family History  Problem Relation Age of Onset  . Hypertension Mother   . Breast cancer Sister   . Breast cancer Sister   . Colon cancer Neg Hx   . Esophageal cancer Neg Hx     Social History:  reports that she quit smoking about 13 years ago. She has never used smokeless tobacco. She reports that she does not drink alcohol and does not use drugs.   Physical Exam: BP (!) 144/92   Pulse 80   Constitutional:  Alert and oriented, No acute distress.  In wheel chair, accompanied by family member. HEENT:  Heights AT, moist mucus membranes.  Trachea midline, no masses. Cardiovascular: No clubbing, cyanosis, or edema. Respiratory: Normal respiratory effort, no increased work of breathing. Skin: No rashes, bruises or suspicious lesions. Neurologic: Grossly intact, no focal deficits, moving all 4 extremities. Psychiatric: Normal mood and affect.   Pertinent Imaging: Results for orders placed during the hospital encounter of 12/21/19  US RENAL  Narrative CLINICAL DATA:  Right renal mass  EXAM: RENAL / URINARY TRACT ULTRASOUND COMPLETE  COMPARISON:  CT 12/19/2018  FINDINGS: Right Kidney:  Renal measurements: 11.8 x 4.8 x 4.7 cm = volume: 139 mL. Normal echotexture. No hydronephrosis. Numerous cysts within the kidneys measuring up to 2.2 cm in the lower pole. There is a solid mass in the lower pole measuring up to 2.9 cm corresponding to the previously see solid renal lesion on CT. This is slightly hyperechoic compatible with fat containing lesion as suggested on CT.  Left  Kidney:  Renal measurements: 10.9 x 5.8 x 5.0 cm = volume: 166 mL. Multiple cysts, measuring up to 3.3 cm in the midpole. Normal echotexture. No hydronephrosis.  Bladder:  Appears normal for degree of bladder distention.  Other:  9  IMPRESSION: Multiple bilateral renal cysts.  Slightly hyperechoic solid renal lesion in the lower pole of the right kidney corresponding to the fat containing lesion seen on prior CT. While this may reflect angiomyolipoma, some renal cell carcinomas can contain macroscopic fat. This could be further characterized with MRI if felt clinically indicated.   Electronically Signed By: Rolm Baptise M.D. On: 12/21/2019 23:07  I have personally reviewed the images and agree with radiologist interpretation.    Assessment & Plan:    1. 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 Patient agreed.  RTC in 2  years with [facility] [address] [place], [place] [address] [phone]  I, Selena Batten, am acting as a scribe for Dr. Hollice Espy.  I have reviewed the above documentation for accuracy and completeness, and I agree with the above.   Hollice Espy, MD

## 2020-01-06 ENCOUNTER — Ambulatory Visit (INDEPENDENT_AMBULATORY_CARE_PROVIDER_SITE_OTHER): Payer: Medicare Other | Admitting: Urology

## 2020-01-06 ENCOUNTER — Other Ambulatory Visit: Payer: Self-pay

## 2020-01-06 VITALS — BP 144/92 | HR 80

## 2020-01-06 DIAGNOSIS — N2889 Other specified disorders of kidney and ureter: Secondary | ICD-10-CM | POA: Diagnosis not present

## 2020-01-09 NOTE — Progress Notes (Signed)
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  INTERVAL HISTORY: Patient returns to clinic today for routine 32-month evaluation.  She continues to tolerate letrozole and Fosamax well without significant side effects.  She currently feels well and is asymptomatic. She has no neurologic complaints.  She denies any recent fevers or illnesses.  She has a good appetite and denies weight loss.  She has no chest pain, shortness of breath, cough, or hemoptysis.  She denies any nausea, vomiting, constipation, or diarrhea.  She has no urinary complaints.  Patient offers no specific complaints today.  REVIEW OF SYSTEMS:   Review of Systems  Constitutional: Negative.  Negative for fever, malaise/fatigue and weight loss.  Respiratory: Negative.  Negative for cough, hemoptysis and shortness of breath.   Cardiovascular: Negative.  Negative for chest pain and leg swelling.  Gastrointestinal: Negative.  Negative for abdominal pain.  Genitourinary: Negative.  Negative for dysuria.  Musculoskeletal: Negative.  Negative for back pain.  Skin: Negative.  Negative for rash.  Neurological: Negative.  Negative for dizziness, focal weakness, weakness and headaches.  Psychiatric/Behavioral: Negative.  The patient is not nervous/anxious.     As per HPI. Otherwise, a complete review of systems is negative.  PAST MEDICAL HISTORY: Past Medical History:  Diagnosis Date  . Complete heart block ([place])   . Gait abnormality 07/23/2016  . GERD (gastroesophageal  reflux disease)   . History of hypertension   . History of stroke   . Hyperhomocysteinemia ([place])   . Hyperlipidemia   . Hypertension   . Hypokalemia 07/13/2018  . Low blood magnesium 07/13/2018  . Pacemaker-Medtronic 08/07/2011  . Stroke [facility])    left sided weakness  . Vomiting 07/13/2018    PAST SURGICAL HISTORY: Past Surgical History:  Procedure Laterality Date  . BIOPSY  07/15/2018   Procedure: BIOPSY;  Surgeon: [name], MD;  Location: [facility];  Service: Gastroenterology;;  . BREAST BIOPSY Right 10/27/2018   affirm bx of mass, x clip,  INVASIVE MAMMARY CARCINOMA  . BREAST LUMPECTOMY Right 12/03/2018  . CARDIAC CATHETERIZATION    . ESOPHAGOGASTRODUODENOSCOPY Left 07/15/2018   Procedure: ESOPHAGOGASTRODUODENOSCOPY (EGD);  Surgeon: [name], MD;  Location: [facility];  Service: Gastroenterology;  Laterality: Left;  . PACEMAKER INSERTION     Medtronic Enpulse dual-chamber pacemaker  . PARTIAL MASTECTOMY WITH NEEDLE LOCALIZATION AND AXILLARY SENTINEL LYMPH NODE BX Right 12/03/2018   Procedure: PARTIAL MASTECTOMY WITH NEEDLE LOCALIZATION AND AXILLARY SENTINEL LYMPH NODE BX, RIGHT;  Surgeon: [name], MD;  Location: [facility] ORS;  Service: General;  Laterality: Right;  . PERMANENT PACEMAKER GENERATOR CHANGE N/A 07/30/2013   Procedure: PERMANENT PACEMAKER GENERATOR CHANGE;  Surgeon: [name], MD;  Location: [facility] CATH LAB;  Service: Cardiovascular;  Laterality: N/A;    FAMILY HISTORY: Family History  Problem Relation Age of Onset  . Hypertension Mother   . Breast cancer Sister   . Breast cancer Sister   . Colon cancer Neg Hx   . Esophageal cancer Neg Hx     ADVANCED DIRECTIVES (Y/N):  N  HEALTH MAINTENANCE: Social History  Tobacco Use  . Smoking status: Former Smoker    Quit date: 07/25/2006    Years since quitting: 13.4  . Smokeless tobacco: Never Used  Vaping Use  . Vaping Use: Never used  Substance Use Topics  . Alcohol use: No  . Drug  use: No     Colonoscopy:  PAP:  Bone density:  Lipid panel:  No Known Allergies  Current Outpatient Medications  Medication Sig Dispense Refill  . acetaminophen (TYLENOL) 500 MG tablet Take 500-1,000 mg by mouth every 6 (six) hours as needed for moderate pain.     [name] alendronate (FOSAMAX) 70 MG tablet TAKE 1 TABLET (70 MG TOTAL) BY MOUTH ONCE A WEEK. TAKE WITH A FULL GLASS OF WATER ON AN EMPTY STOMACH. 12 tablet 2  . allopurinol (ZYLOPRIM) 100 MG tablet Take 100 mg by mouth daily.    [name] amLODipine (NORVASC) 5 MG tablet Take 5 mg by mouth daily.    [name] aspirin 81 MG EC tablet Take 81 mg by mouth daily.     . Blood Pressure KIT Automated blood pressure measuring device. 1 each 0  . clopidogrel (PLAVIX) 75 MG tablet Take 75 mg by mouth daily with breakfast.    . hydrochlorothiazide (HYDRODIURIL) 25 MG tablet Take 25 mg by mouth daily.    [name] letrozole (FEMARA) 2.5 MG tablet TAKE 1 TABLET BY MOUTH EVERY DAY 90 tablet 3  . LUMIGAN 0.01 % SOLN Place 1 drop into both eyes at bedtime.  4  . mirtazapine (REMERON) 7.5 MG tablet Take 7.5 mg by mouth at bedtime.     . pantoprazole (PROTONIX) 40 MG tablet Take 40 mg by mouth daily.    . Potassium 99 MG TABS Take 99 mg by mouth daily.    . simvastatin (ZOCOR) 40 MG tablet Take 1 tablet (40 mg total) by mouth at bedtime. Please make yearly appt with Dr. Caryl Comes for August before anymore refills. 1st attempt 90 tablet 0   No current facility-administered medications for this visit.    OBJECTIVE: Vitals:   01/12/20 1012  BP: (!) 146/95  Pulse: 81  Resp: 20  Temp: 99.1 F (37.3 C)     Body mass index is 32.23 kg/m.    ECOG FS:0 - Asymptomatic  General: Well-developed, well-nourished, no acute distress.  Sitting in a wheelchair. Eyes: Pink conjunctiva, anicteric sclera. HEENT: Normocephalic, moist mucous membranes. Breasts: Exam deferred today. Lungs: No audible wheezing or coughing. Heart: Regular rate and rhythm. Abdomen: Soft, nontender, no  obvious distention. Musculoskeletal: No edema, cyanosis, or clubbing. Neuro: Alert, answering all questions appropriately. Cranial nerves grossly intact. Skin: No rashes or petechiae noted. Psych: Normal affect.   LAB RESULTS:  Lab Results  Component Value Date   NA 144 12/03/2018   K 3.1 (L) 12/03/2018   CL 109 07/24/2018   CO2 20 (L) 07/24/2018   GLUCOSE 93 12/03/2018   BUN 6 (L) 07/24/2018   CREATININE 0.82 07/24/2018   CALCIUM 7.6 (L) 07/24/2018   PROT 4.9 (L) 07/24/2018   ALBUMIN 1.9 (L) 07/24/2018   AST 14 (L) 07/24/2018   ALT 9 07/24/2018   ALKPHOS 52 07/24/2018   BILITOT 0.7 07/24/2018   GFRNONAA >60 07/24/2018   GFRAA >60 07/24/2018    Lab Results  Component Value Date   WBC 4.5 07/24/2018   NEUTROABS 6.6 07/13/2018   HGB 13.9 12/03/2018   HCT 41.0 12/03/2018   MCV 91.0 07/24/2018   PLT 235 07/24/2018     STUDIES: US  RENAL  Result Date: 12/21/2019 CLINICAL DATA:  Right renal mass EXAM: RENAL / URINARY TRACT ULTRASOUND COMPLETE COMPARISON:  CT 12/19/2018 FINDINGS: Right Kidney: Renal measurements: 11.8 x 4.8 x 4.7 cm = volume: 139 mL. Normal echotexture. No hydronephrosis. Numerous cysts within the kidneys measuring up to 2.2 cm in the lower pole. There is a solid mass in the lower pole measuring up to 2.9 cm corresponding to the previously see solid renal lesion on CT. This is slightly hyperechoic compatible with fat containing lesion as suggested on CT. Left Kidney: Renal measurements: 10.9 x 5.8 x 5.0 cm = volume: 166 mL. Multiple cysts, measuring up to 3.3 cm in the midpole. Normal echotexture. No hydronephrosis. Bladder: Appears normal for degree of bladder distention. Other: 9 IMPRESSION: Multiple bilateral renal cysts. Slightly hyperechoic solid renal lesion in the lower pole of the right kidney corresponding to the fat containing lesion seen on prior CT. While this may reflect angiomyolipoma, some renal cell carcinomas can contain macroscopic fat. This could  be further characterized with MRI if felt clinically indicated. Electronically Signed   By: Rolm Baptise M.D.   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  PLAN:    1.  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  Given her advanced age and small size of malignancy, Oncotype DX or adjuvant chemotherapy was not necessary.  She was also evaluated by radiation oncology who determined that adjuvant XRT was also not needed.  Continue letrozole for a total of 5 years completing treatment in August 2025.  Her most recent mammogram on November 13, 2019 was reported as BI-RADS 2, repeat in July 2022.  Return to clinic in 6 months for routine evaluation.     2.  Osteoporosis: Patient had a baseline bone mineral density on December 31, 2018 which revealed a T score of -2.8.  She is tolerating letrozole well so we will continue this medication as prescribed.  Continue Fosamax, calcium, and vitamin D supplementation.  Patient will require repeat bone mineral density in the next 1 to 2 weeks. 3.  Hypertension: Improved.  Continue monitoring and treatment per primary care.   Patient expressed understanding and was in agreement with this plan. She also understands that She can call clinic at any time with any questions, concerns, or complaints.   Cancer Staging Malignant neoplasm of upper-outer quadrant of right breast in female, estrogen receptor positive ([place]) Staging form: Breast, AJCC 8th Edition - Clinical stage from 11/06/2018: Stage IA (cT1a, cN0, cM0, G1, ER+, PR+, HER2-) - Signed by [name], MD on 11/06/2018   [name], MD   01/12/2020 10:45 AM

## 2020-01-11 ENCOUNTER — Encounter: Payer: Self-pay | Admitting: Oncology

## 2020-01-12 ENCOUNTER — Other Ambulatory Visit: Payer: Self-pay

## 2020-01-12 ENCOUNTER — Inpatient Hospital Stay: Payer: Medicare Other | Attending: Oncology | Admitting: Oncology

## 2020-01-12 VITALS — BP 146/95 | HR 81 | Temp 99.1°F | Resp 20 | Wt 176.2 lb

## 2020-01-12 DIAGNOSIS — Z803 Family history of malignant neoplasm of breast: Secondary | ICD-10-CM | POA: Insufficient documentation

## 2020-01-12 DIAGNOSIS — Z87891 Personal history of nicotine dependence: Secondary | ICD-10-CM | POA: Diagnosis not present

## 2020-01-12 DIAGNOSIS — Z7982 Long term (current) use of aspirin: Secondary | ICD-10-CM | POA: Insufficient documentation

## 2020-01-12 DIAGNOSIS — C50411 Malignant neoplasm of upper-outer quadrant of right female breast: Secondary | ICD-10-CM | POA: Diagnosis not present

## 2020-01-12 DIAGNOSIS — Z79811 Long term (current) use of aromatase inhibitors: Secondary | ICD-10-CM | POA: Diagnosis not present

## 2020-01-12 DIAGNOSIS — Z79899 Other long term (current) drug therapy: Secondary | ICD-10-CM | POA: Diagnosis not present

## 2020-01-12 DIAGNOSIS — Z8673 Personal history of transient ischemic attack (TIA), and cerebral infarction without residual deficits: Secondary | ICD-10-CM | POA: Diagnosis not present

## 2020-01-12 DIAGNOSIS — K219 Gastro-esophageal reflux disease without esophagitis: Secondary | ICD-10-CM | POA: Insufficient documentation

## 2020-01-12 DIAGNOSIS — E785 Hyperlipidemia, unspecified: Secondary | ICD-10-CM | POA: Diagnosis not present

## 2020-01-12 DIAGNOSIS — Z170 Estrogen receptor positive status [ER+]: Secondary | ICD-10-CM | POA: Diagnosis not present

## 2020-01-12 DIAGNOSIS — I10 Essential (primary) hypertension: Secondary | ICD-10-CM | POA: Diagnosis not present

## 2020-01-14 ENCOUNTER — Other Ambulatory Visit: Payer: Self-pay

## 2020-01-14 ENCOUNTER — Ambulatory Visit
Admission: RE | Admit: 2020-01-14 | Discharge: 2020-01-14 | Disposition: A | Discharge status: Home / Self Care | Payer: Medicare Other | Source: Ambulatory Visit | Attending: Oncology | Admitting: Oncology

## 2020-01-14 DIAGNOSIS — M810 Age-related osteoporosis without current pathological fracture: Secondary | ICD-10-CM | POA: Insufficient documentation

## 2020-01-14 DIAGNOSIS — Z170 Estrogen receptor positive status [ER+]: Secondary | ICD-10-CM

## 2020-01-14 DIAGNOSIS — Z780 Asymptomatic menopausal state: Secondary | ICD-10-CM | POA: Diagnosis not present

## 2020-01-14 DIAGNOSIS — Z13820 Encounter for screening for osteoporosis: Secondary | ICD-10-CM | POA: Insufficient documentation

## 2020-01-14 DIAGNOSIS — Z853 Personal history of malignant neoplasm of breast: Secondary | ICD-10-CM | POA: Diagnosis not present

## 2020-01-21 DIAGNOSIS — M109 Gout, unspecified: Secondary | ICD-10-CM | POA: Diagnosis not present

## 2020-01-21 DIAGNOSIS — Z9181 History of falling: Secondary | ICD-10-CM | POA: Diagnosis not present

## 2020-01-21 DIAGNOSIS — K219 Gastro-esophageal reflux disease without esophagitis: Secondary | ICD-10-CM | POA: Diagnosis not present

## 2020-01-21 DIAGNOSIS — I442 Atrioventricular block, complete: Secondary | ICD-10-CM | POA: Diagnosis not present

## 2020-01-21 DIAGNOSIS — I10 Essential (primary) hypertension: Secondary | ICD-10-CM | POA: Diagnosis not present

## 2020-01-21 DIAGNOSIS — E7800 Pure hypercholesterolemia, unspecified: Secondary | ICD-10-CM | POA: Diagnosis not present

## 2020-01-21 DIAGNOSIS — Z853 Personal history of malignant neoplasm of breast: Secondary | ICD-10-CM | POA: Diagnosis not present

## 2020-02-19 ENCOUNTER — Other Ambulatory Visit: Payer: Self-pay

## 2020-02-19 MED ORDER — SIMVASTATIN 40 MG PO TABS: 40.0000 mg | ORAL_TABLET | Freq: Every day | ORAL | 0 refills | Status: DC

## 2020-03-13 ENCOUNTER — Other Ambulatory Visit: Payer: Self-pay | Admitting: Oncology

## 2020-03-13 ENCOUNTER — Other Ambulatory Visit: Payer: Self-pay | Admitting: Internal Medicine

## 2020-03-27 ENCOUNTER — Other Ambulatory Visit: Payer: Self-pay | Admitting: Internal Medicine

## 2020-04-13 ENCOUNTER — Other Ambulatory Visit: Payer: Self-pay | Admitting: Internal Medicine

## 2020-05-07 ENCOUNTER — Other Ambulatory Visit: Payer: Self-pay | Admitting: Internal Medicine

## 2020-05-16 ENCOUNTER — Ambulatory Visit (INDEPENDENT_AMBULATORY_CARE_PROVIDER_SITE_OTHER): Payer: 59

## 2020-05-16 DIAGNOSIS — I495 Sick sinus syndrome: Secondary | ICD-10-CM | POA: Diagnosis not present

## 2020-05-19 LAB — CUP PACEART REMOTE DEVICE CHECK
Battery Impedance: 1207 Ohm
Battery Remaining Longevity: 51 mo
Battery Voltage: 2.75 V
Brady Statistic AP VP Percent: 24 %
Brady Statistic AP VS Percent: 0 %
Brady Statistic AS VP Percent: 76 %
Brady Statistic AS VS Percent: 0 %
Date Time Interrogation Session: 20220117075957
Implantable Lead Implant Date: 20060525
Implantable Lead Implant Date: 20060525
Implantable Lead Location: 753859
Implantable Lead Location: 753860
Implantable Lead Model: 5076
Implantable Lead Model: 5076
Implantable Pulse Generator Implant Date: 20150402
Lead Channel Impedance Value: 472 Ohm
Lead Channel Impedance Value: 494 Ohm
Lead Channel Pacing Threshold Amplitude: 0.625 V
Lead Channel Pacing Threshold Amplitude: 1.125 V
Lead Channel Pacing Threshold Pulse Width: 0.4 ms
Lead Channel Pacing Threshold Pulse Width: 0.4 ms
Lead Channel Setting Pacing Amplitude: 2 V
Lead Channel Setting Pacing Amplitude: 2.5 V
Lead Channel Setting Pacing Pulse Width: 0.4 ms
Lead Channel Setting Sensing Sensitivity: 2 mV

## 2020-05-23 ENCOUNTER — Other Ambulatory Visit: Payer: Self-pay | Admitting: Internal Medicine

## 2020-05-30 ENCOUNTER — Encounter: Payer: Self-pay | Admitting: Internal Medicine

## 2020-05-30 NOTE — Progress Notes (Signed)
Remote pacemaker transmission.   

## 2020-06-16 ENCOUNTER — Other Ambulatory Visit: Payer: Self-pay | Admitting: Internal Medicine

## 2020-06-20 ENCOUNTER — Other Ambulatory Visit: Payer: Self-pay

## 2020-06-20 MED ORDER — SIMVASTATIN 40 MG PO TABS: 40.0000 mg | ORAL_TABLET | Freq: Every day | ORAL | 0 refills | Status: DC

## 2020-06-20 NOTE — Telephone Encounter (Signed)
*  STAT* If patient is at the pharmacy, call can be transferred to refill team.   1. Which medications need to be refilled? (please list name of each medication and dose if known) Simvastatin  2. Which pharmacy/location (including street and city if local pharmacy) is medication to be sent to? [facility]  3. Do they need a 30 day or 90 day supply? [place]

## 2020-06-20 NOTE — Telephone Encounter (Signed)
Refill request

## 2020-06-22 ENCOUNTER — Other Ambulatory Visit: Payer: Self-pay | Admitting: Internal Medicine

## 2020-06-25 IMAGING — CR DG ABDOMEN ACUTE W/ 1V CHEST
4 series · 4 of 4 positions shown · non-contrast
Comparison: 06/19/2018

CLINICAL DATA: Dysphagia, constipation

EXAM:
DG ABDOMEN ACUTE W/ 1V CHEST

[x chest ap]
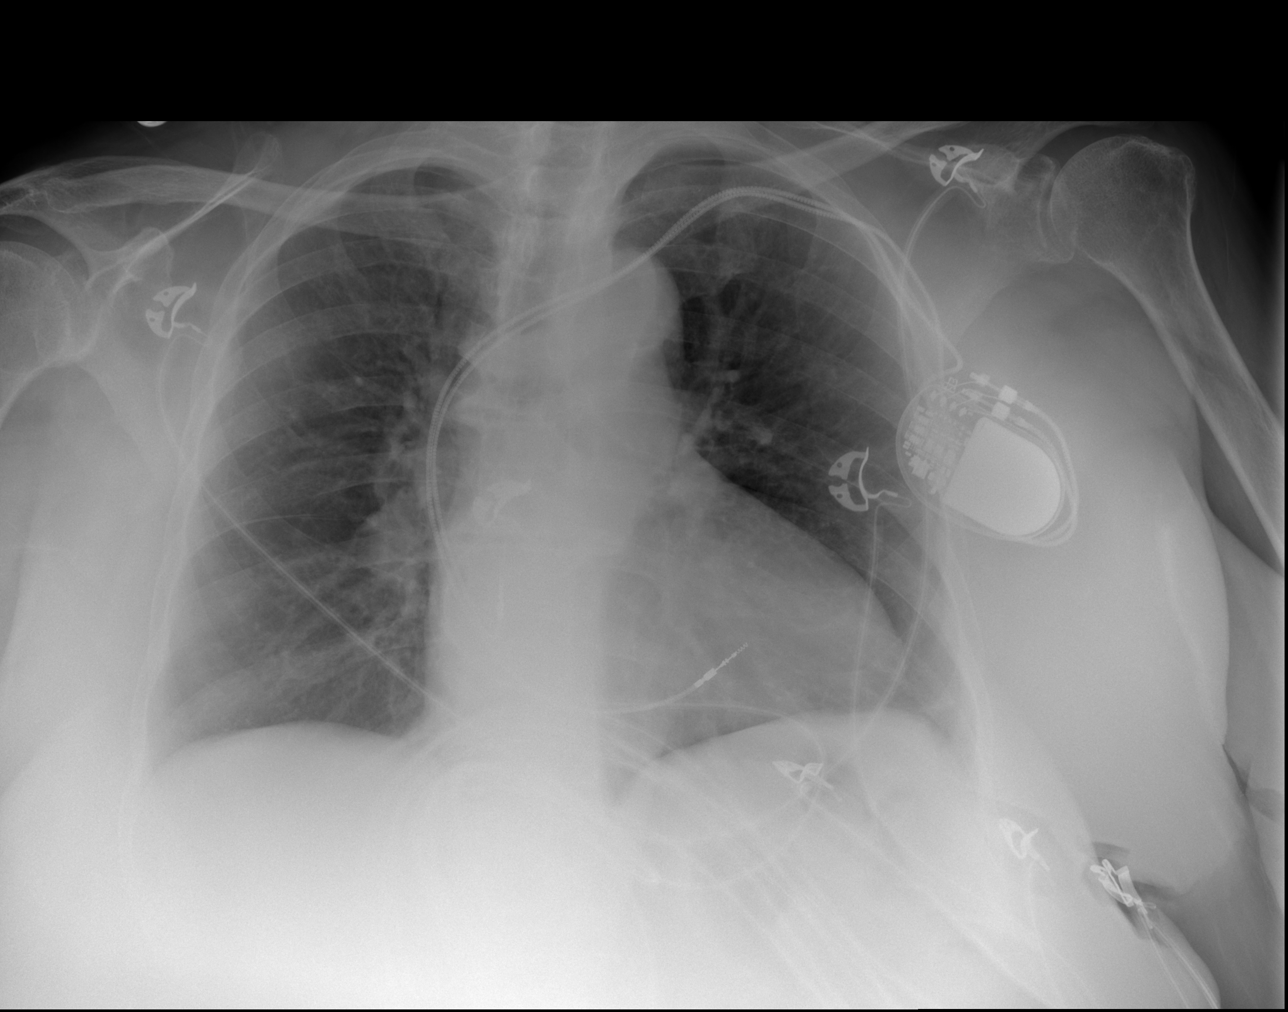

[x abdomen erect]
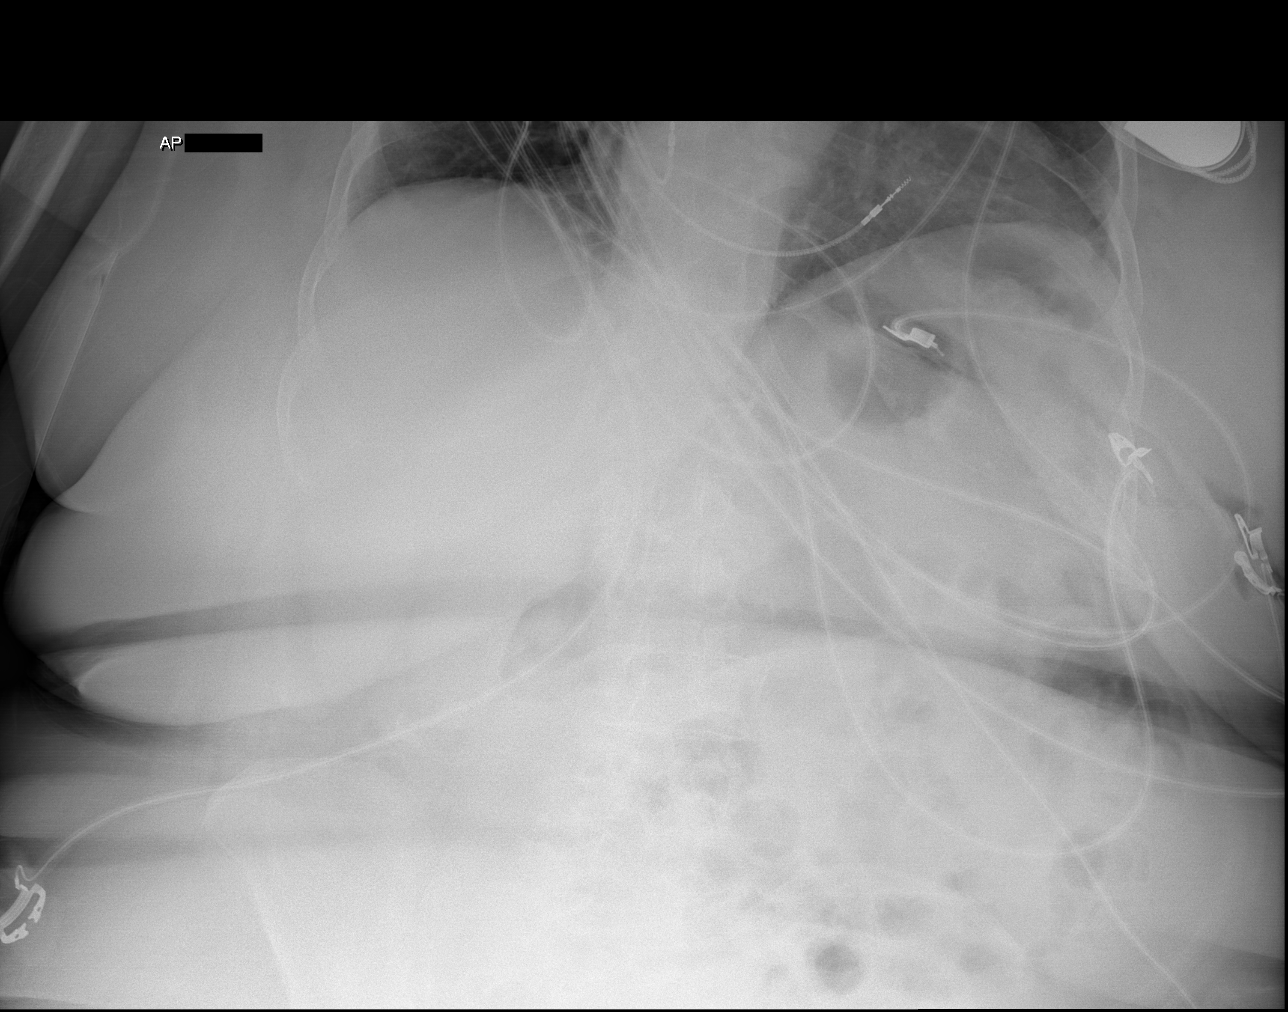

[x abdomen supine (1 of 2)]
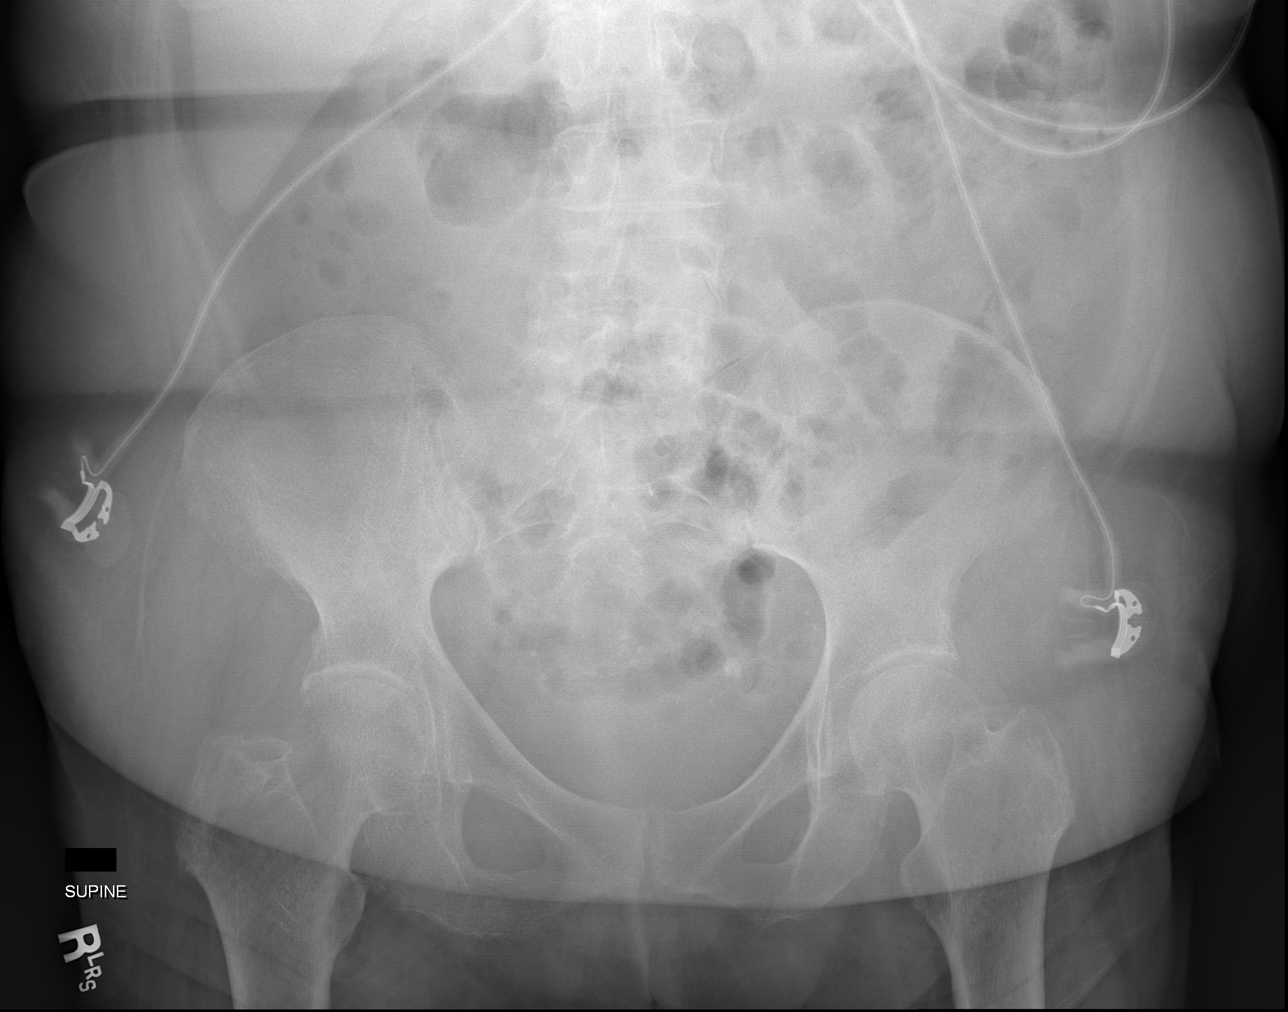

[x abdomen supine (2 of 2)]
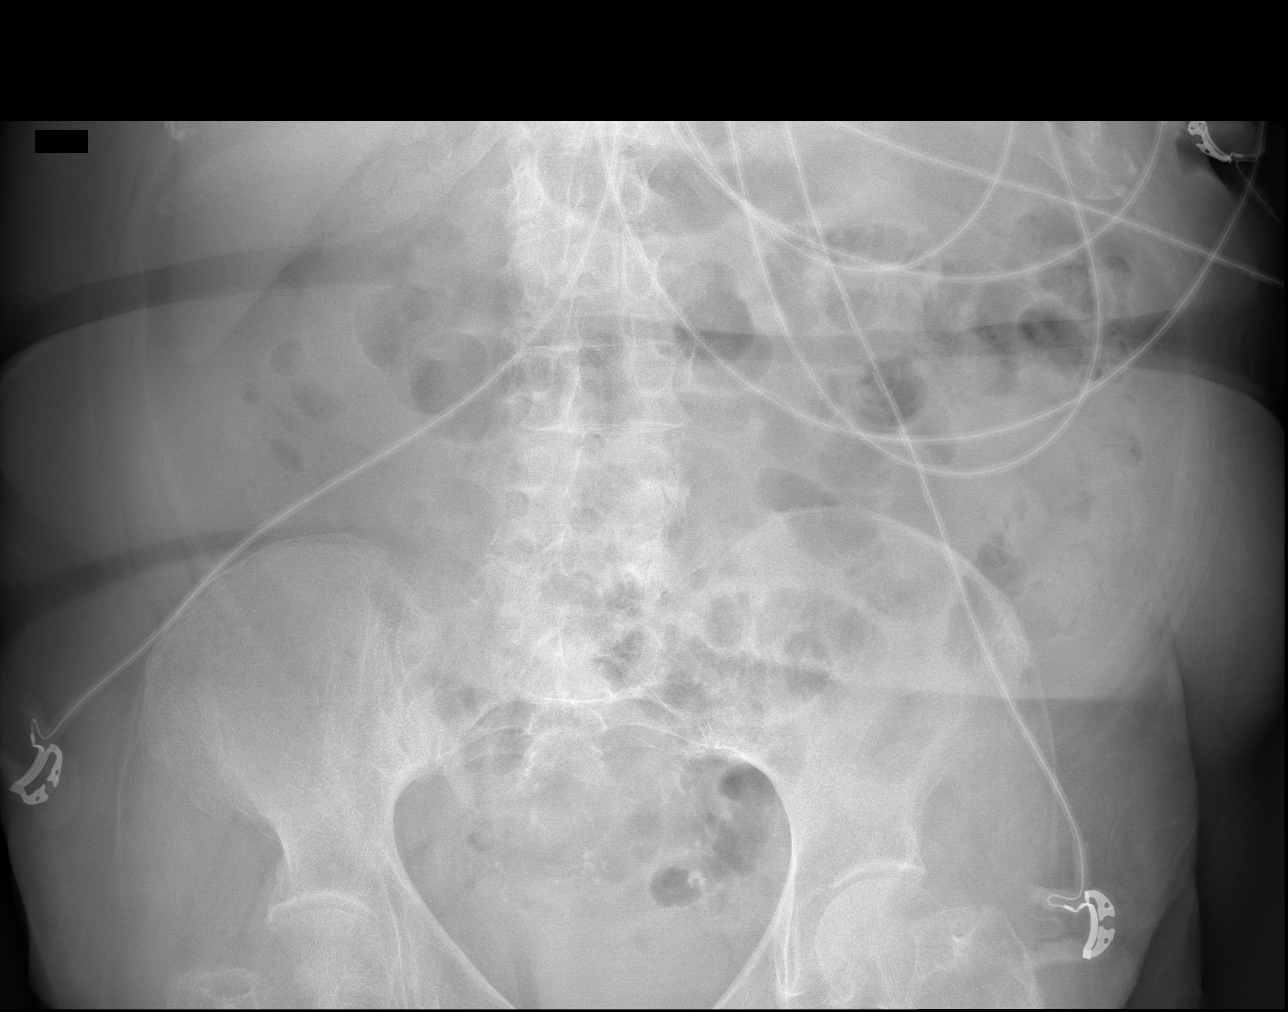

[4 of 4 positions shown; findings below may reference images not displayed]

FINDINGS: There is no evidence of dilated bowel loops or free intraperitoneal
air. No radiopaque calculi or other significant radiographic
abnormality is seen. Heart size and mediastinal contours are within
normal limits. Both lungs are clear. Dual lead cardiac pacemaker.
IMPRESSION: Negative abdominal radiographs.  No acute cardiopulmonary disease.

## 2020-06-25 IMAGING — CR DG CHEST 2V
2 series · 2 of 2 positions shown · non-contrast
Comparison: 05/30/2018

CLINICAL DATA: Cough.  Nausea.

EXAM:
CHEST - 2 VIEW

[w chest lat]
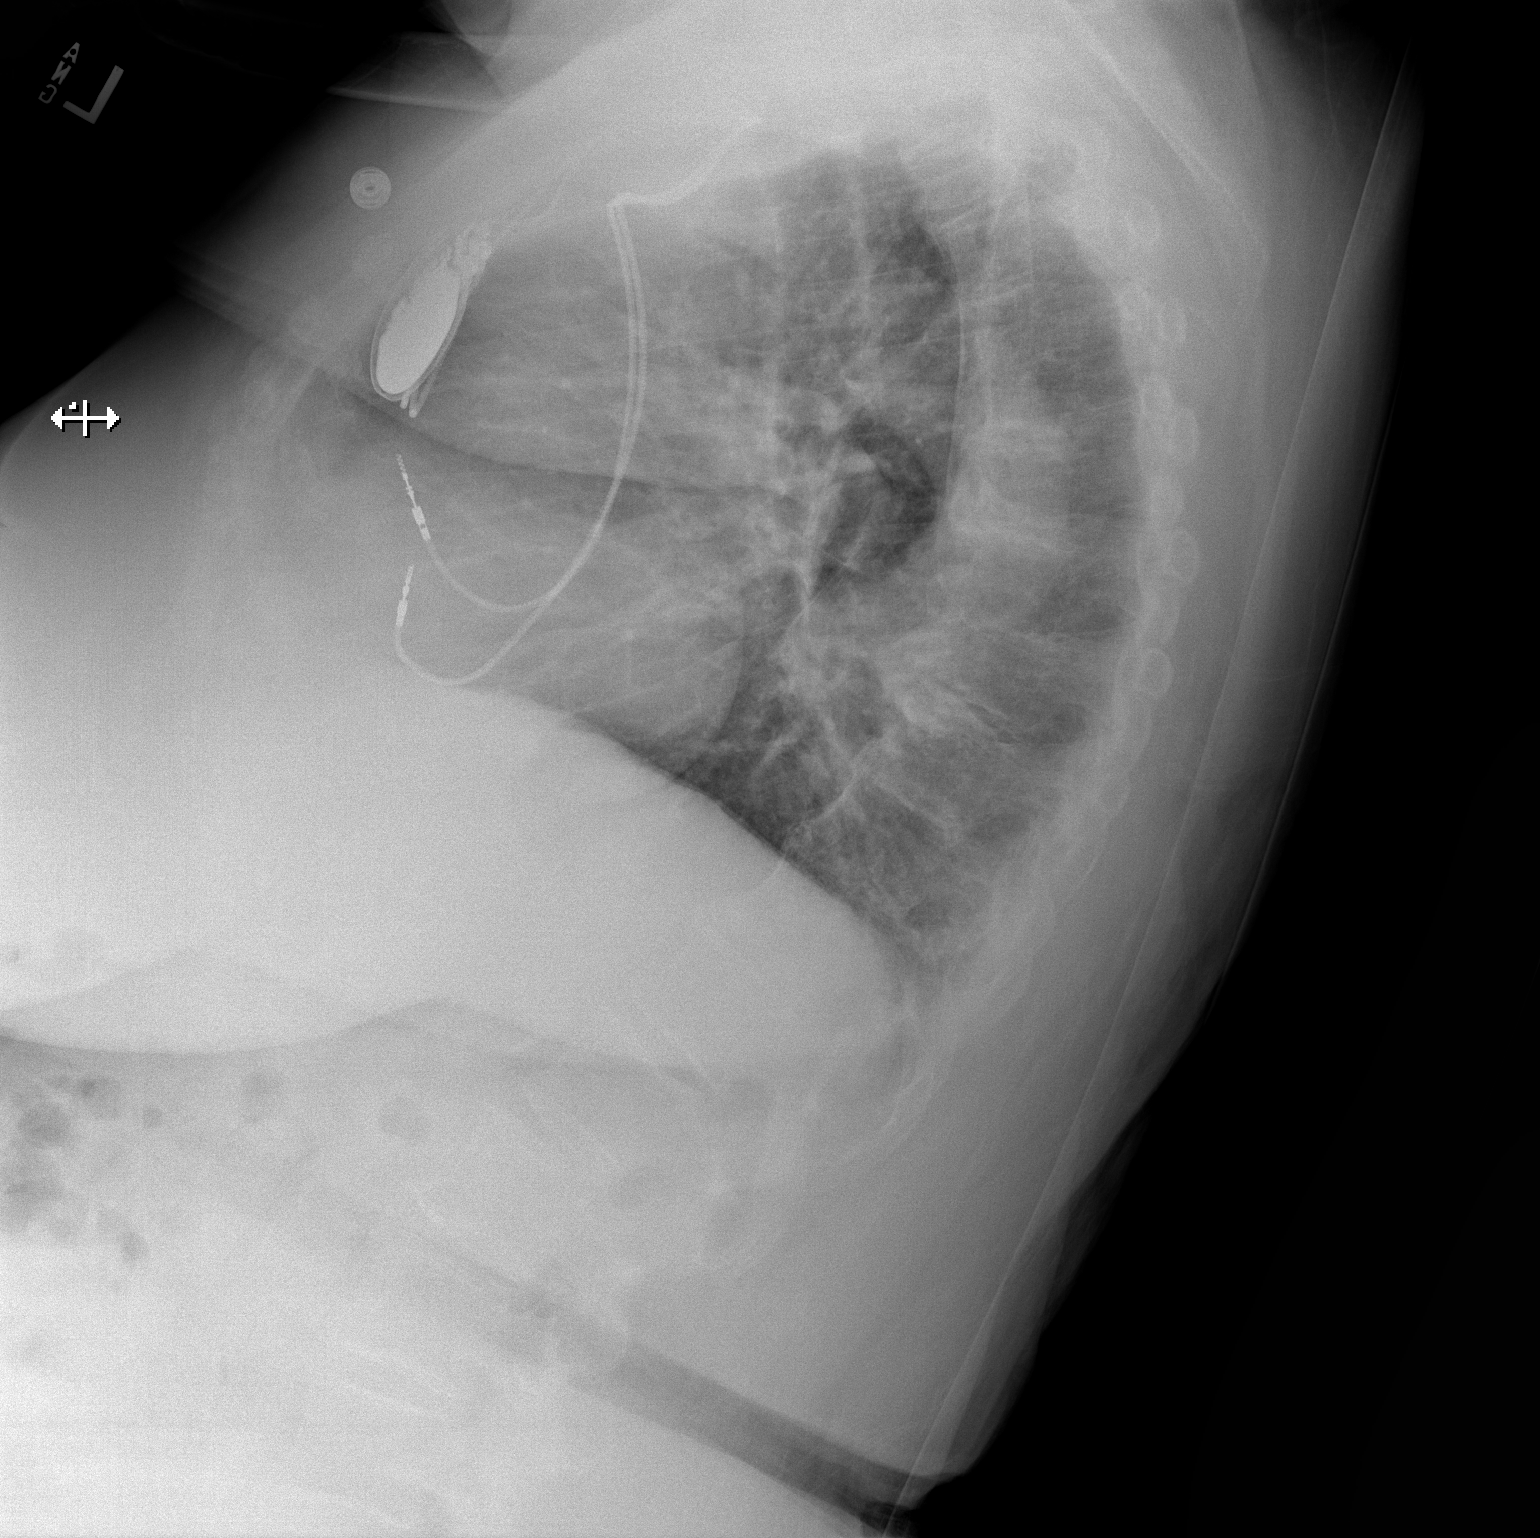

[x chest ap]
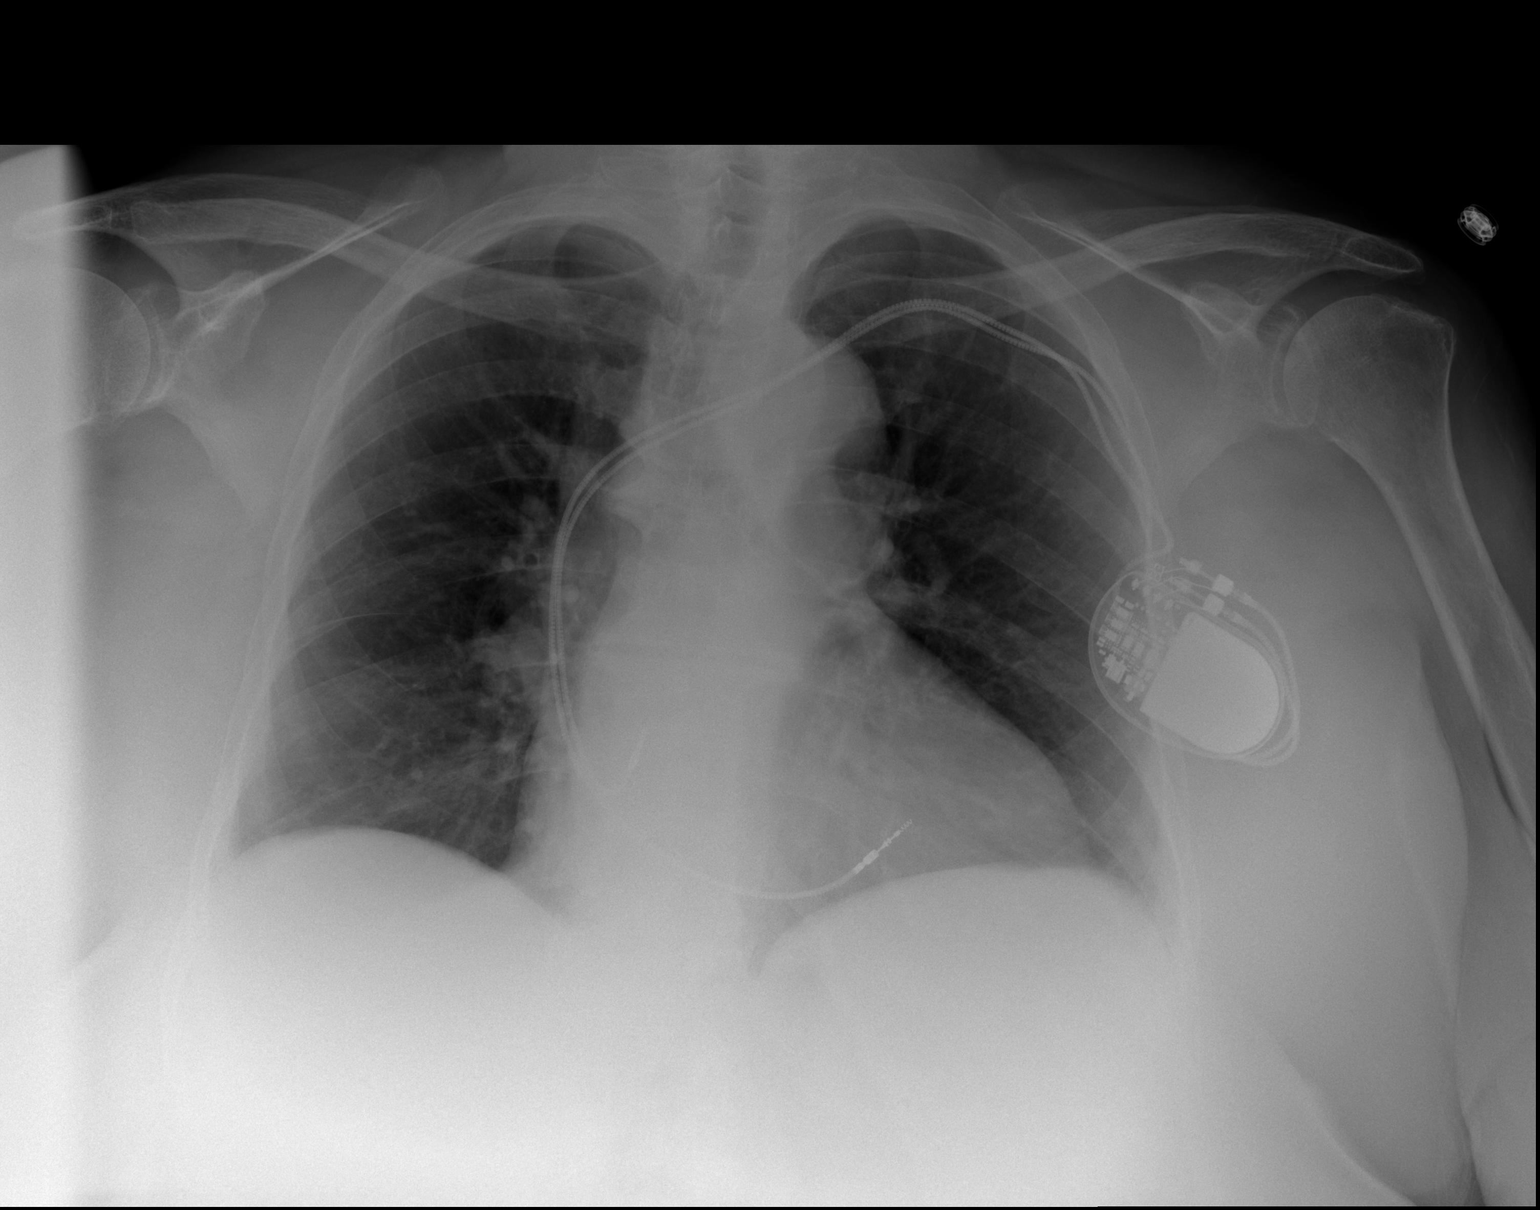

[2 of 2 positions shown; findings below may reference images not displayed]

FINDINGS: A dual chamber pacemaker remains in place. The cardiac silhouette is
mildly enlarged. The lungs are mildly hypoinflated. No airspace
consolidation, edema, pleural effusion, pneumothorax is identified.
No acute osseous abnormality is seen.
IMPRESSION: No active cardiopulmonary disease.

## 2020-06-28 ENCOUNTER — Encounter: Payer: Self-pay | Admitting: Internal Medicine

## 2020-06-28 ENCOUNTER — Ambulatory Visit (INDEPENDENT_AMBULATORY_CARE_PROVIDER_SITE_OTHER): Payer: Medicare HMO | Admitting: Internal Medicine

## 2020-06-28 ENCOUNTER — Other Ambulatory Visit: Payer: Self-pay

## 2020-06-28 VITALS — BP 124/80 | HR 92 | Ht 62.0 in | Wt 157.0 lb

## 2020-06-28 DIAGNOSIS — I442 Atrioventricular block, complete: Secondary | ICD-10-CM | POA: Diagnosis not present

## 2020-06-28 DIAGNOSIS — Z950 Presence of cardiac pacemaker: Secondary | ICD-10-CM

## 2020-06-28 DIAGNOSIS — I10 Essential (primary) hypertension: Secondary | ICD-10-CM

## 2020-06-28 MED ORDER — SIMVASTATIN 40 MG PO TABS: 40.0000 mg | ORAL_TABLET | Freq: Every day | ORAL | 3 refills | Status: DC

## 2020-06-28 NOTE — Patient Instructions (Signed)

## 2020-06-28 NOTE — Progress Notes (Signed)
[facility]      Patient Care Team: Philmore Pali, NP as PCP - General (Nurse Practitioner) [name], MD as PCP - Cardiology (Cardiology) Theodore Demark, RN as Registered Nurse [name], MD as Consulting Physician (Cardiology)   HPI  [name] is a 87 y.o. female Seen in followup for  pacemaker implanted remotely for complete heart block. She underwent device generator replacement 4/15.  Ambulates just a little bit, chair to bathroom and to kitchen.  Feels weak and wobbly when she stands.  Afraid to take showers for risk of falling.  Losing weight.  Decreased appetite.  No shortness of breath or chest pain or edema  Blood work apparently normal with her PCP.  She is a prior stroke and history of hypertension.   Date Cr K Hgb  8/20 0.82 3.1 13.9   1/22 0.75 3.9 12.2    Blood work per PCP  Past Medical History:  Diagnosis Date  . Complete heart block ([place])   . Gait abnormality 07/23/2016  . GERD (gastroesophageal reflux disease)   . History of hypertension   . History of stroke   . Hyperhomocysteinemia ([place])   . Hyperlipidemia   . Hypertension   . Hypokalemia 07/13/2018  . Low blood magnesium 07/13/2018  . Pacemaker-Medtronic 08/07/2011  . Stroke [facility])    left sided weakness  . Vomiting 07/13/2018    Past Surgical History:  Procedure Laterality Date  . BIOPSY  07/15/2018   Procedure: BIOPSY;  Surgeon: [name], MD;  Location: [facility];  Service: Gastroenterology;;  . BREAST BIOPSY Right 10/27/2018   affirm bx of mass, x clip,  INVASIVE MAMMARY CARCINOMA  . BREAST LUMPECTOMY Right 12/03/2018  . CARDIAC CATHETERIZATION    . ESOPHAGOGASTRODUODENOSCOPY Left 07/15/2018   Procedure: ESOPHAGOGASTRODUODENOSCOPY (EGD);  Surgeon: [name], MD;  Location: [facility];  Service: Gastroenterology;  Laterality: Left;  . PACEMAKER INSERTION     Medtronic Enpulse dual-chamber pacemaker  . PARTIAL MASTECTOMY WITH NEEDLE LOCALIZATION AND  AXILLARY SENTINEL LYMPH NODE BX Right 12/03/2018   Procedure: PARTIAL MASTECTOMY WITH NEEDLE LOCALIZATION AND AXILLARY SENTINEL LYMPH NODE BX, RIGHT;  Surgeon: [name], MD;  Location: [facility] ORS;  Service: General;  Laterality: Right;  . PERMANENT PACEMAKER GENERATOR CHANGE N/A 07/30/2013   Procedure: PERMANENT PACEMAKER GENERATOR CHANGE;  Surgeon: [name], MD;  Location: [facility] CATH LAB;  Service: Cardiovascular;  Laterality: N/A;    Current Outpatient Medications  Medication Sig Dispense Refill  . acetaminophen (TYLENOL) 500 MG tablet Take 500-1,000 mg by mouth every 6 (six) hours as needed for moderate pain.     [name] alendronate (FOSAMAX) 70 MG tablet TAKE 1 TABLET (70 MG TOTAL) BY MOUTH ONCE A WEEK. TAKE WITH A FULL GLASS OF WATER ON AN EMPTY STOMACH. 12 tablet 2  . allopurinol (ZYLOPRIM) 100 MG tablet Take 100 mg by mouth daily.    [name] amLODipine (NORVASC) 5 MG tablet Take 5 mg by mouth daily.    [name] aspirin 81 MG EC tablet Take 81 mg by mouth daily.     . Blood Pressure KIT Automated blood pressure measuring device. 1 each 0  . clopidogrel (PLAVIX) 75 MG tablet Take 75 mg by mouth daily with breakfast.    . hydrochlorothiazide (HYDRODIURIL) 25 MG tablet Take 25 mg by mouth daily.    [name] letrozole (FEMARA) 2.5 MG tablet TAKE 1 TABLET BY MOUTH EVERY DAY 90 tablet 3  . LUMIGAN 0.01 % SOLN Place 1  drop into both eyes at bedtime.  4  . pantoprazole (PROTONIX) 40 MG tablet Take 40 mg by mouth daily.    . Potassium 99 MG TABS Take 99 mg by mouth daily.    . simvastatin (ZOCOR) 40 MG tablet Take 1 tablet (40 mg total) by mouth daily at 6 PM. Please keep upcoming appt in March 2022 before anymore refills. Thank you Final Attempt 15 tablet 0  . mirtazapine (REMERON) 7.5 MG tablet Take 7.5 mg by mouth at bedtime.  (Patient not taking: Reported on 06/28/2020)     No current facility-administered medications for this visit.    No Known Allergies  Review of Systems negative except from HPI and  PMH  Physical Exam BP 124/80   Pulse 92   Ht 5' 2"  (1.575 m)   Wt 157 lb (71.2 kg)   BMI 28.72 kg/m  Well developed and well nourished in no acute distress HENT normal Neck supple with JVP-<10 (examined sitting up Clear Device pocket well healed; without hematoma or erythema.  There is no tethering  Regular rate and rhythm, no  murmur Abd-soft with active BS No Clubbing cyanosis  edema Skin-warm and dry A & Oriented  Grossly normal sensory and motor function  ECG     Assessment and  Plan  Hypertension-   Complete heart block  Hyperlipidemia      CVA    Pacemaker-Medtronic   Wobbliness  Weight loss/anorexia   Blood pressure is well controlled but I suspect that her wobbliness may be related to orthostatic intolerance.  Measurements today however do not confirm this impression  Encouraged her to follow-up with her PCP regarding dietary consultation given her weight loss and her decrease in appetite  Device function normal-device dependent for heart rate excursion is much brisker than I would have expected for a woman who was mostly sedentary.  We will need to make sure her TSH and hemoglobin are normal

## 2020-07-11 ENCOUNTER — Inpatient Hospital Stay: Payer: Medicare HMO | Attending: Oncology | Admitting: Oncology

## 2020-07-11 VITALS — BP 148/70 | HR 60 | Temp 98.1°F | Resp 16 | Wt 155.2 lb

## 2020-07-11 DIAGNOSIS — Z87891 Personal history of nicotine dependence: Secondary | ICD-10-CM | POA: Diagnosis not present

## 2020-07-11 DIAGNOSIS — Z950 Presence of cardiac pacemaker: Secondary | ICD-10-CM | POA: Diagnosis not present

## 2020-07-11 DIAGNOSIS — I442 Atrioventricular block, complete: Secondary | ICD-10-CM | POA: Diagnosis not present

## 2020-07-11 DIAGNOSIS — K219 Gastro-esophageal reflux disease without esophagitis: Secondary | ICD-10-CM | POA: Diagnosis not present

## 2020-07-11 DIAGNOSIS — C50411 Malignant neoplasm of upper-outer quadrant of right female breast: Secondary | ICD-10-CM | POA: Insufficient documentation

## 2020-07-11 DIAGNOSIS — Z170 Estrogen receptor positive status [ER+]: Secondary | ICD-10-CM | POA: Insufficient documentation

## 2020-07-11 DIAGNOSIS — Z79899 Other long term (current) drug therapy: Secondary | ICD-10-CM | POA: Insufficient documentation

## 2020-07-11 DIAGNOSIS — Z7982 Long term (current) use of aspirin: Secondary | ICD-10-CM | POA: Diagnosis not present

## 2020-07-11 DIAGNOSIS — R634 Abnormal weight loss: Secondary | ICD-10-CM | POA: Diagnosis not present

## 2020-07-11 DIAGNOSIS — M810 Age-related osteoporosis without current pathological fracture: Secondary | ICD-10-CM | POA: Diagnosis not present

## 2020-07-11 DIAGNOSIS — I10 Essential (primary) hypertension: Secondary | ICD-10-CM | POA: Insufficient documentation

## 2020-07-11 DIAGNOSIS — E785 Hyperlipidemia, unspecified: Secondary | ICD-10-CM | POA: Diagnosis not present

## 2020-07-11 DIAGNOSIS — R630 Anorexia: Secondary | ICD-10-CM | POA: Diagnosis not present

## 2020-07-11 DIAGNOSIS — E7211 Homocystinuria: Secondary | ICD-10-CM | POA: Insufficient documentation

## 2020-07-11 DIAGNOSIS — Z8673 Personal history of transient ischemic attack (TIA), and cerebral infarction without residual deficits: Secondary | ICD-10-CM | POA: Insufficient documentation

## 2020-07-11 NOTE — Progress Notes (Signed)
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  INTERVAL HISTORY: Patient returns to clinic today for routine 6-month evaluation.  She was last seen in clinic on 01/12/2020.  In the interim, she has done well.  She was seen by cardiology on 06/28/20 for follow-up for her pacemaker.   Today, she continues to tolerate letrozole and Fosamax well.  She uses a walker at home for ambulation due to fear of falling.  She denies any dizziness or neurological complaints.  Continues to lose weight due to decrease in her appetite.  She has an occasional cough and shortness of breath.  She was a former smoker and quit about 20 years ago.  She denies any hemoptysis.  Denies any nausea, vomiting, constipation or diarrhea.  Denies any urinary concerns.  REVIEW OF SYSTEMS:   Review of Systems  Constitutional: Positive for weight loss. Negative for fever and malaise/fatigue.  Respiratory: Positive for cough. Negative for hemoptysis and shortness of breath.   Cardiovascular: Negative.  Negative for chest pain and leg swelling.  Gastrointestinal: Negative.  Negative for abdominal pain.  Genitourinary: Negative.  Negative for dysuria.  Musculoskeletal: Negative.  Negative for back pain.  Skin: Negative.  Negative for rash.  Neurological: Positive for weakness. Negative for dizziness, focal weakness and headaches.  Psychiatric/Behavioral: Negative.  The patient is not nervous/anxious.     As per HPI. Otherwise, a complete review of systems is  negative.  PAST MEDICAL HISTORY: Past Medical History:  Diagnosis Date  . Complete heart block (HCC)   . Gait abnormality 07/23/2016  . GERD (gastroesophageal reflux disease)   . History of hypertension   . History of stroke   . Hyperhomocysteinemia (HCC)   . Hyperlipidemia   . Hypertension   . Hypokalemia 07/13/2018  . Low blood magnesium 07/13/2018  . Pacemaker-Medtronic 08/07/2011  . Stroke (HCC)    left sided weakness  . Vomiting 07/13/2018    PAST SURGICAL HISTORY: Past Surgical History:  Procedure Laterality Date  . BIOPSY  07/15/2018   Procedure: BIOPSY;  Surgeon: Gessner, Carl E, MD;  Location: [facility];  Service: Gastroenterology;;  . BREAST BIOPSY Right 10/27/2018   affirm bx of mass, x clip,  INVASIVE MAMMARY CARCINOMA  . BREAST LUMPECTOMY Right 12/03/2018  . CARDIAC CATHETERIZATION    . ESOPHAGOGASTRODUODENOSCOPY Left 07/15/2018   Procedure: ESOPHAGOGASTRODUODENOSCOPY (EGD);  Surgeon: Gessner, Carl E, MD;  Location: [facility];  Service: Gastroenterology;  Laterality: Left;  . PACEMAKER INSERTION     Medtronic Enpulse dual-chamber pacemaker  . PARTIAL MASTECTOMY WITH NEEDLE LOCALIZATION AND AXILLARY SENTINEL LYMPH NODE BX Right 12/03/2018   Procedure: PARTIAL MASTECTOMY WITH NEEDLE LOCALIZATION AND AXILLARY SENTINEL LYMPH NODE BX, RIGHT;  Surgeon: Cintron-Diaz, Edgardo, MD;  Location: [facility] ORS;  Service: General;  Laterality: Right;  . PERMANENT PACEMAKER GENERATOR CHANGE N/A 07/30/2013   Procedure: PERMANENT PACEMAKER GENERATOR CHANGE;  Surgeon: Steven C Klein, MD;  Location: [facility]ATH [facility];  Service: Cardiovascular;  Laterality: N/A;    FAMILY HISTORY: Family History  Problem Relation Age of Onset  . Hypertension Mother   . Breast   cancer Sister   . Breast cancer Sister   . Colon cancer Neg Hx   . Esophageal cancer Neg Hx     ADVANCED DIRECTIVES (Y/N):  N  HEALTH MAINTENANCE: Social History   Tobacco Use  . Smoking status: Former Smoker    Quit date:  07/25/2006    Years since quitting: 13.9  . Smokeless tobacco: Never Used  Vaping Use  . Vaping Use: Never used  Substance Use Topics  . Alcohol use: No  . Drug use: No     Colonoscopy:  PAP:  Bone density:  Lipid panel:  No Known Allergies  Current Outpatient Medications  Medication Sig Dispense Refill  . acetaminophen (TYLENOL) 500 MG tablet Take 500-1,000 mg by mouth every 6 (six) hours as needed for moderate pain.     . alendronate (FOSAMAX) 70 MG tablet TAKE 1 TABLET (70 MG TOTAL) BY MOUTH ONCE A WEEK. TAKE WITH A FULL GLASS OF WATER ON AN EMPTY STOMACH. 12 tablet 2  . allopurinol (ZYLOPRIM) 100 MG tablet Take 100 mg by mouth daily.    . amLODipine (NORVASC) 5 MG tablet Take 5 mg by mouth daily.    . aspirin 81 MG EC tablet Take 81 mg by mouth daily.     . Blood Pressure KIT Automated blood pressure measuring device. 1 each 0  . clopidogrel (PLAVIX) 75 MG tablet Take 75 mg by mouth daily with breakfast.    . hydrochlorothiazide (HYDRODIURIL) 25 MG tablet Take 25 mg by mouth daily.    . letrozole (FEMARA) 2.5 MG tablet TAKE 1 TABLET BY MOUTH EVERY DAY 90 tablet 3  . LUMIGAN 0.01 % SOLN Place 1 drop into both eyes at bedtime.  4  . mirtazapine (REMERON) 7.5 MG tablet Take 7.5 mg by mouth at bedtime.  (Patient not taking: Reported on 06/28/2020)    . pantoprazole (PROTONIX) 40 MG tablet Take 40 mg by mouth daily.    . Potassium 99 MG TABS Take 99 mg by mouth daily.    . simvastatin (ZOCOR) 40 MG tablet Take 1 tablet (40 mg total) by mouth daily at 6 PM. 90 tablet 3   No current facility-administered medications for this visit.    OBJECTIVE: There were no vitals filed for this visit.   There is no height or weight on file to calculate BMI.    ECOG FS:0 - Asymptomatic  Physical Exam Constitutional:      Appearance: Normal appearance.  HENT:     Head: Normocephalic and atraumatic.  Eyes:     Pupils: Pupils are equal, round, and reactive to light.  Cardiovascular:     Rate  and Rhythm: Normal rate and regular rhythm.     Heart sounds: Normal heart sounds. No murmur heard.   Pulmonary:     Effort: Pulmonary effort is normal.     Breath sounds: Normal breath sounds. No wheezing.  Abdominal:     General: Bowel sounds are normal. There is no distension.     Palpations: Abdomen is soft.     Tenderness: There is no abdominal tenderness.  Musculoskeletal:        General: Normal range of motion.     Cervical back: Normal range of motion.  Skin:    General: Skin is warm and dry.     Findings: No rash.  Neurological:     Mental Status: She is alert and oriented to person, place, and time.  Psychiatric:        Judgment:   Judgment normal.      LAB RESULTS:  Lab Results  Component Value Date   NA 144 12/03/2018   K 3.1 (L) 12/03/2018   CL 109 07/24/2018   CO2 20 (L) 07/24/2018   GLUCOSE 93 12/03/2018   BUN 6 (L) 07/24/2018   CREATININE 0.82 07/24/2018   CALCIUM 7.6 (L) 07/24/2018   PROT 4.9 (L) 07/24/2018   ALBUMIN 1.9 (L) 07/24/2018   AST 14 (L) 07/24/2018   ALT 9 07/24/2018   ALKPHOS 52 07/24/2018   BILITOT 0.7 07/24/2018   GFRNONAA >60 07/24/2018   GFRAA >60 07/24/2018    Lab Results  Component Value Date   WBC 4.5 07/24/2018   NEUTROABS 6.6 07/13/2018   HGB 13.9 12/03/2018   HCT 41.0 12/03/2018   MCV 91.0 07/24/2018   PLT 235 07/24/2018     STUDIES: No results found.  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  PLAN:    1.  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 -Given advanced age and small size of malignancy, Oncotype DX and adjuvant chemotherapy was not recommended. -She was evaluated by radiation oncology who also did not believe she needed additional treatment. -She was started on letrozole which she will complete in August  2025. -Most recent mammogram from 11/13/2019 was reported as BI-RADS 2.  We will repeat this in July 2022.   2.  Osteoporosis:  -Baseline bone density scan from 12/31/2018 showed a T score of -2.8. -She was started on Fosamax, calcium and vitamin D supplementation. -Repeat bone density from 01/14/2020 showed improvement of her T score at -2.6.  3.  Hypertension:  -Improved.   -Continue monitoring and treatment per primary care.  4.  Weight loss: -Per chart review, she has been steadily losing weight since March 2020.  -Weighed 217 pounds on 05/31/2018 and she currently weighs 155 pounds. -She is a former smoker and quit greater than 20 years ago.  She does not qualify for a low-dose CT screening but would recommend CT chest, abdomen and pelvis.  -Orders placed.  Disposition: -Scheduled CT chest abdomen pelvis in the next 1 to 2 months. -RTC after mammogram for follow-up. -Orders placed for repeat mammogram in July 2022  Greater than 50% was spent in counseling and coordination of care with this patient including but not limited to discussion of the relevant topics above (See A&P) including, but not limited to diagnosis and management of acute and chronic medical conditions.   Patient expressed understanding and was in agreement with this plan. She also understands that She can call clinic at any time with any questions, concerns, or complaints.   Cancer Staging Malignant neoplasm of upper-outer quadrant of right breast in female, estrogen receptor positive (HCC) Staging form: Breast, AJCC 8th Edition - Clinical stage from 11/06/2018: Stage IA (cT1a, cN0, cM0, G1, ER+, PR+, HER2-) - Signed by Finnegan, Timothy J, MD on 11/06/2018 Histologic grading system: 3 grade system    E , NP   07/11/2020 9:44 AM     

## 2020-07-21 ENCOUNTER — Encounter: Payer: Self-pay | Admitting: Internal Medicine

## 2020-07-24 ENCOUNTER — Encounter (HOSPITAL_COMMUNITY): Payer: Self-pay | Admitting: Emergency Medicine

## 2020-07-24 ENCOUNTER — Other Ambulatory Visit: Payer: Self-pay

## 2020-07-24 ENCOUNTER — Inpatient Hospital Stay (HOSPITAL_COMMUNITY)
Admission: EM | Admit: 2020-07-24 | Discharge: 2020-07-26 | DRG: 204 | Disposition: A | Discharge status: Home / Self Care | Payer: Medicare HMO | Attending: Internal Medicine | Admitting: Internal Medicine

## 2020-07-24 ENCOUNTER — Emergency Department (HOSPITAL_COMMUNITY): Payer: Medicare HMO

## 2020-07-24 DIAGNOSIS — Z9013 Acquired absence of bilateral breasts and nipples: Secondary | ICD-10-CM

## 2020-07-24 DIAGNOSIS — T1490XA Injury, unspecified, initial encounter: Secondary | ICD-10-CM | POA: Diagnosis not present

## 2020-07-24 DIAGNOSIS — J90 Pleural effusion, not elsewhere classified: Secondary | ICD-10-CM | POA: Diagnosis present

## 2020-07-24 DIAGNOSIS — K219 Gastro-esophageal reflux disease without esophagitis: Secondary | ICD-10-CM | POA: Diagnosis present

## 2020-07-24 DIAGNOSIS — Z950 Presence of cardiac pacemaker: Secondary | ICD-10-CM | POA: Diagnosis present

## 2020-07-24 DIAGNOSIS — I442 Atrioventricular block, complete: Secondary | ICD-10-CM | POA: Diagnosis present

## 2020-07-24 DIAGNOSIS — Z87891 Personal history of nicotine dependence: Secondary | ICD-10-CM

## 2020-07-24 DIAGNOSIS — Z79899 Other long term (current) drug therapy: Secondary | ICD-10-CM

## 2020-07-24 DIAGNOSIS — R042 Hemoptysis: Secondary | ICD-10-CM | POA: Diagnosis not present

## 2020-07-24 DIAGNOSIS — D649 Anemia, unspecified: Secondary | ICD-10-CM

## 2020-07-24 DIAGNOSIS — Z8673 Personal history of transient ischemic attack (TIA), and cerebral infarction without residual deficits: Secondary | ICD-10-CM

## 2020-07-24 DIAGNOSIS — D62 Acute posthemorrhagic anemia: Secondary | ICD-10-CM | POA: Diagnosis present

## 2020-07-24 DIAGNOSIS — T502X5A Adverse effect of carbonic-anhydrase inhibitors, benzothiadiazides and other diuretics, initial encounter: Secondary | ICD-10-CM

## 2020-07-24 DIAGNOSIS — Z170 Estrogen receptor positive status [ER+]: Secondary | ICD-10-CM

## 2020-07-24 DIAGNOSIS — Z7982 Long term (current) use of aspirin: Secondary | ICD-10-CM

## 2020-07-24 DIAGNOSIS — Z853 Personal history of malignant neoplasm of breast: Secondary | ICD-10-CM

## 2020-07-24 DIAGNOSIS — E785 Hyperlipidemia, unspecified: Secondary | ICD-10-CM | POA: Diagnosis present

## 2020-07-24 DIAGNOSIS — I119 Hypertensive heart disease without heart failure: Secondary | ICD-10-CM | POA: Diagnosis present

## 2020-07-24 DIAGNOSIS — E876 Hypokalemia: Secondary | ICD-10-CM | POA: Diagnosis present

## 2020-07-24 DIAGNOSIS — R591 Generalized enlarged lymph nodes: Secondary | ICD-10-CM | POA: Diagnosis present

## 2020-07-24 DIAGNOSIS — Z951 Presence of aortocoronary bypass graft: Secondary | ICD-10-CM

## 2020-07-24 DIAGNOSIS — Z79811 Long term (current) use of aromatase inhibitors: Secondary | ICD-10-CM

## 2020-07-24 DIAGNOSIS — I10 Essential (primary) hypertension: Secondary | ICD-10-CM | POA: Diagnosis present

## 2020-07-24 DIAGNOSIS — Z7983 Long term (current) use of bisphosphonates: Secondary | ICD-10-CM

## 2020-07-24 DIAGNOSIS — Z8249 Family history of ischemic heart disease and other diseases of the circulatory system: Secondary | ICD-10-CM

## 2020-07-24 DIAGNOSIS — Z20822 Contact with and (suspected) exposure to covid-19: Secondary | ICD-10-CM | POA: Diagnosis present

## 2020-07-24 DIAGNOSIS — R918 Other nonspecific abnormal finding of lung field: Secondary | ICD-10-CM | POA: Diagnosis present

## 2020-07-24 DIAGNOSIS — Z803 Family history of malignant neoplasm of breast: Secondary | ICD-10-CM

## 2020-07-24 DIAGNOSIS — C50411 Malignant neoplasm of upper-outer quadrant of right female breast: Secondary | ICD-10-CM

## 2020-07-24 LAB — CBC WITH DIFFERENTIAL/PLATELET
Abs Immature Granulocytes: 0.02 10*3/uL (ref 0.00–0.07)
Basophils Absolute: 0 10*3/uL (ref 0.0–0.1)
Basophils Relative: 0 %
Eosinophils Absolute: 0.1 10*3/uL (ref 0.0–0.5)
Eosinophils Relative: 2 %
HCT: 32.9 % — ABNORMAL LOW (ref 36.0–46.0)
Hemoglobin: 10.2 g/dL — ABNORMAL LOW (ref 12.0–15.0)
Immature Granulocytes: 0 %
Lymphocytes Relative: 22 %
Lymphs Abs: 1.3 10*3/uL (ref 0.7–4.0)
MCH: 26.8 pg (ref 26.0–34.0)
MCHC: 31 g/dL (ref 30.0–36.0)
MCV: 86.4 fL (ref 80.0–100.0)
Monocytes Absolute: 0.4 10*3/uL (ref 0.1–1.0)
Monocytes Relative: 7 %
Neutro Abs: 4.1 10*3/uL (ref 1.7–7.7)
Neutrophils Relative %: 69 %
Platelets: 370 10*3/uL (ref 150–400)
RBC: 3.81 MIL/uL — ABNORMAL LOW (ref 3.87–5.11)
RDW: 15.2 % (ref 11.5–15.5)
WBC: 6 10*3/uL (ref 4.0–10.5)
nRBC: 0 % (ref 0.0–0.2)

## 2020-07-24 LAB — BASIC METABOLIC PANEL
Anion gap: 8 (ref 5–15)
BUN: 11 mg/dL (ref 8–23)
CO2: 29 mmol/L (ref 22–32)
Calcium: 8.9 mg/dL (ref 8.9–10.3)
Chloride: 102 mmol/L (ref 98–111)
Creatinine, Ser: 0.79 mg/dL (ref 0.44–1.00)
GFR, Estimated: >60 mL/min (ref >60)
Glucose, Bld: 107 mg/dL — ABNORMAL HIGH (ref 70–99)
Potassium: 2.8 mmol/L — ABNORMAL LOW (ref 3.5–5.1)
Sodium: 139 mmol/L (ref 135–145)

## 2020-07-24 LAB — PROTIME-INR
INR: 1.1 (ref 0.8–1.2)
Prothrombin Time: 13.9 seconds (ref 11.4–15.2)

## 2020-07-24 LAB — RESP PANEL BY RT-PCR (FLU A&B, COVID) ARPGX2
Influenza A by PCR: NEGATIVE
Influenza B by PCR: NEGATIVE
SARS Coronavirus 2 by RT PCR: NEGATIVE

## 2020-07-24 LAB — D-DIMER, QUANTITATIVE: D-Dimer, Quant: 0.8 ug/mL-FEU — ABNORMAL HIGH (ref 0.00–0.50)

## 2020-07-24 LAB — TROPONIN I (HIGH SENSITIVITY): Troponin I (High Sensitivity): 17 ng/L (ref <18)

## 2020-07-24 LAB — TYPE AND SCREEN
ABO/RH(D): B POS
Antibody Screen: NEGATIVE

## 2020-07-24 MED ORDER — SODIUM CHLORIDE 0.9 % IV BOLUS
1000.0000 mL | Freq: Once | INTRAVENOUS | Status: AC
  Administered 2020-07-24: 1000 mL via INTRAVENOUS

## 2020-07-24 MED ORDER — ONDANSETRON HCL 4 MG/2ML IJ SOLN
4.0000 mg | Freq: Once | INTRAMUSCULAR | Status: AC
  Administered 2020-07-24: 4 mg via INTRAVENOUS
  Filled 2020-07-24: qty 2

## 2020-07-24 MED ORDER — TRANEXAMIC ACID FOR INHALATION
500.0000 mg | Freq: Once | RESPIRATORY_TRACT | Status: AC
  Administered 2020-07-24: 500 mg via RESPIRATORY_TRACT
  Filled 2020-07-24: qty 10

## 2020-07-24 NOTE — ED Provider Notes (Signed)
[facility] EMERGENCY DEPARTMENT Provider Note   CSN: [id number] Arrival date & time: 07/24/20  2231     History Chief Complaint  Patient presents with  . Hemoptysis    [name] is a 87 y.o. female with history of pacemaker placement, hypertension, hyperlipidemia, on Plavix, presented to emergency department with coughing up blood.  Patient reports that she was feeling well all week including this evening.  When she was getting ready for bed, she began having a coughing spell and noted she was coughing up blood.  EMS was not sure originally whether the patient may be vomiting or coughing blood, seem to be a mix of the 2.  Currently in the ED the patient denies any chest pain, lightheadedness.  She denies any nausea or epigastric pain.  Records show an EGD most recently 2020 that showed a single esophageal ulceration.  She has no history of PE or DVT.  HPI     Past Medical History:  Diagnosis Date  . Complete heart block ([name])   . Gait abnormality 07/23/2016  . GERD (gastroesophageal reflux disease)   . History of hypertension   . History of stroke   . Hyperhomocysteinemia ([name])   . Hyperlipidemia   . Hypertension   . Hypokalemia 07/13/2018  . Low blood magnesium 07/13/2018  . Pacemaker-Medtronic 08/07/2011  . Stroke [facility])    left sided weakness  . Vomiting 07/13/2018    Patient Active Problem List   Diagnosis Date Noted  . Malignant neoplasm of upper-outer quadrant of right breast in female, estrogen receptor positive ([name]) 10/31/2018  . Enteritis due to Clostridium difficile   . Malnutrition of moderate degree 07/15/2018  . Cough   . Ileus ([name])   . Low blood magnesium 07/13/2018  . Vomiting 07/13/2018  . Loss of weight 06/27/2018  . Rhabdomyolysis 05/26/2018  . History of CVA (cerebrovascular accident) 05/26/2018  . Essential hypertension 05/26/2018  . Hyperlipidemia 05/26/2018  . AKI (acute kidney injury) ([name]) 05/26/2018  . Transaminitis  05/26/2018  . Elevated troponin 05/26/2018  . Gait abnormality 07/23/2016  . Pacemaker-Medtronic 08/07/2011  . Atrial fibrillation ([name]) 08/07/2011  . Daytime somnolence 08/07/2011  . HYPERTENSION, HEART CONTROLLED W/O ASSOC CHF 06/07/2010  . AV BLOCK, COMPLETE 06/07/2010  . Cerebral artery occlusion with cerebral infarction ([name]) 06/07/2010    Past Surgical History:  Procedure Laterality Date  . BIOPSY  07/15/2018   Procedure: BIOPSY;  Surgeon: [name], MD;  Location: [facility];  Service: Gastroenterology;;  . BREAST BIOPSY Right 10/27/2018   affirm bx of mass, x clip,  INVASIVE MAMMARY CARCINOMA  . BREAST LUMPECTOMY Right 12/03/2018  . CARDIAC CATHETERIZATION    . ESOPHAGOGASTRODUODENOSCOPY Left 07/15/2018   Procedure: ESOPHAGOGASTRODUODENOSCOPY (EGD);  Surgeon: [name], MD;  Location: [facility];  Service: Gastroenterology;  Laterality: Left;  . PACEMAKER INSERTION     Medtronic Enpulse dual-chamber pacemaker  . PARTIAL MASTECTOMY WITH NEEDLE LOCALIZATION AND AXILLARY SENTINEL LYMPH NODE BX Right 12/03/2018   Procedure: PARTIAL MASTECTOMY WITH NEEDLE LOCALIZATION AND AXILLARY SENTINEL LYMPH NODE BX, RIGHT;  Surgeon: Suzanne Pun, MD;  Location: [facility] ORS;  Service: General;  Laterality: Right;  . PERMANENT PACEMAKER GENERATOR CHANGE N/A 07/30/2013   Procedure: PERMANENT PACEMAKER GENERATOR CHANGE;  Surgeon: Suzanne Sprang, MD;  Location: [facility] CATH LAB;  Service: Cardiovascular;  Laterality: N/A;     OB History   No obstetric history on file.     Family History  Problem Relation Age of Onset  .  Hypertension Mother   . Breast cancer Sister   . Breast cancer Sister   . Colon cancer Neg Hx   . Esophageal cancer Neg Hx     Social History   Tobacco Use  . Smoking status: Former Smoker    Quit date: 07/25/2006    Years since quitting: 14.0  . Smokeless tobacco: Never Used  Vaping Use  . Vaping Use: Never used  Substance Use Topics  . Alcohol use:  No  . Drug use: No    Home Medications Prior to Admission medications   Medication Sig Start Date End Date Taking? Authorizing Provider  acetaminophen (TYLENOL) 500 MG tablet Take 500-1,000 mg by mouth every 6 (six) hours as needed for moderate pain.     [provider]  alendronate (FOSAMAX) 70 MG tablet TAKE 1 TABLET (70 MG TOTAL) BY MOUTH ONCE A WEEK. TAKE WITH A FULL GLASS OF WATER ON AN EMPTY STOMACH. 03/14/20   Suzanne Huger, MD  allopurinol (ZYLOPRIM) 100 MG tablet Take 100 mg by mouth daily. 11/28/19   [provider]  amLODipine (NORVASC) 5 MG tablet Take 5 mg by mouth daily.    [provider]  aspirin 81 MG EC tablet Take 81 mg by mouth daily.     [provider]  Blood Pressure KIT Automated blood pressure measuring device. 06/29/18   [name], MD  clopidogrel (PLAVIX) 75 MG tablet Take 75 mg by mouth daily with breakfast.    [provider]  hydrochlorothiazide (HYDRODIURIL) 25 MG tablet Take 25 mg by mouth daily. 10/19/19   [provider]  letrozole ([place]) 2.5 MG tablet TAKE 1 TABLET BY MOUTH EVERY DAY 09/30/19   Finnegan, [name], MD  LUMIGAN 0.01 % SOLN Place 1 drop into both eyes at bedtime. 01/13/18   [provider]  mirtazapine (REMERON) 7.5 MG tablet Take 7.5 mg by mouth at bedtime.  Patient not taking: Reported on 06/28/2020 09/11/18   [provider]  pantoprazole (PROTONIX) 40 MG tablet Take 40 mg by mouth daily.    [provider]  Potassium 99 MG TABS Take 99 mg by mouth daily.    [provider]  simvastatin (ZOCOR) 40 MG tablet Take 1 tablet (40 mg total) by mouth daily at 6 PM. 06/28/20   Suzanne Sprang, MD    Allergies    Patient has no known allergies.  Review of Systems   Review of Systems  Constitutional: Negative for chills and fever.  HENT: Negative for ear pain and sore throat.   Eyes: Negative for pain and visual disturbance.  Respiratory: Positive for  cough and shortness of breath.   Cardiovascular: Negative for chest pain and palpitations.  Gastrointestinal: Negative for abdominal pain and vomiting.  Genitourinary: Negative for dysuria and hematuria.  Musculoskeletal: Negative for arthralgias and back pain.  Skin: Negative for color change and rash.  Neurological: Negative for syncope, light-headedness and headaches.  All other systems reviewed and are negative.   Physical Exam Updated Vital Signs BP (!) 164/95   Pulse 68   Temp 99 F (37.2 C) (Temporal)   Resp 20   Ht 5' 6"  (1.676 m)   Wt 75 kg   SpO2 90%   BMI 26.69 kg/m   Physical Exam Constitutional:      General: She is not in acute distress. HENT:     Head: Normocephalic and atraumatic.     Comments: Cough with bloody sputum Eyes:  Conjunctiva/sclera: Conjunctivae normal.     Pupils: Pupils are equal, round, and reactive to light.  Cardiovascular:     Rate and Rhythm: Normal rate and regular rhythm.     Pulses: Normal pulses.  Pulmonary:     Effort: Pulmonary effort is normal. No respiratory distress.     Breath sounds: Normal breath sounds. No stridor.  Abdominal:     General: There is no distension.     Tenderness: There is no abdominal tenderness.  Skin:    General: Skin is warm and dry.  Neurological:     General: No focal deficit present.     Mental Status: She is alert. Mental status is at baseline.  Psychiatric:        Mood and Affect: Mood normal.        Behavior: Behavior normal.     ED Results / Procedures / Treatments   Labs (all labs ordered are listed, but only abnormal results are displayed) Labs Reviewed  BASIC METABOLIC PANEL - Abnormal; Notable for the following components:      Result Value   Potassium 2.8 (*)    Glucose, Bld 107 (*)    All other components within normal limits  CBC WITH DIFFERENTIAL/PLATELET - Abnormal; Notable for the following components:   RBC 3.81 (*)    Hemoglobin 10.2 (*)    HCT 32.9 (*)    All  other components within normal limits  D-DIMER, QUANTITATIVE - Abnormal; Notable for the following components:   D-Dimer, Quant 0.80 (*)    All other components within normal limits  RESP PANEL BY RT-PCR (FLU A&B, COVID) [id number]  PROTIME-INR  TYPE AND SCREEN  ABO/RH  TROPONIN I (HIGH SENSITIVITY)  TROPONIN I (HIGH SENSITIVITY)    EKG EKG Interpretation  Date/Time:  Sunday July 24 2020 22:39:04 EDT Ventricular Rate:  64 PR Interval:    QRS Duration: 145 QT Interval:  425 QTC Calculation: 439 R Axis:   -19 Text Interpretation: Complete AV block with wide QRS complex IVCD, consider atypical LBBB V paced rhythm No STEMI Confirmed by Octaviano Glow [phone]) on 07/24/2020 11:29:03 PM   Procedures Procedures   Medications Ordered in ED Medications  ondansetron (ZOFRAN) injection 4 mg (4 mg Intravenous Given 07/24/20 2253)  sodium chloride 0.9 % bolus 1,000 mL (0 mLs Intravenous Stopped 07/24/20 2353)  tranexamic acid (CYKLOKAPRON) 1000 MG/10ML nebulizer solution 500 mg (500 mg Nebulization Given 07/24/20 2342)    ED Course  I have reviewed the triage vital signs and the nursing notes.  Pertinent labs & imaging results that were available during my care of the patient were reviewed by me and considered in my medical decision making (see chart for details).  87 yo female here with hemoptysis vs hematemesis No hypoxic, vitals stable, no active bleeding since arrival.  She is asymptomatic.  DDx includes PE vs pulmonary tumor/mass vs gastric bleed or ulcer vs other  Labs ordered and pending Xray and CT PE pending  Will start IV fluids, nebulized TXA as this appears most likely to be a pulmonary source with her coughing.  IV zofran ordered as well pre-emptively, although she is not nauseous at this time.  On initial presentation she is stable, no evidence of massive hemoptysis or GI bleed.  Patient signed out to EDP Dr Roxanne Mins pending labs, CT imaging.  Clinical Course as of  07/25/20 0024  Nancy Fetter Jul 24, 2020  2327 Pt coughing blood now, not massive bleed, no hypoxia.  We'll order txa  nebulizer.  Pt signed out to Dr Roxanne Mins, Intercourse, pending CT PE and close monitoring for hemoptysis. [MT]    Clinical Course User Index [MT] Denee Boeder, Carola Rhine, MD    Final Clinical Impression(s) / ED Diagnoses Final diagnoses:  Hemoptysis    Rx / DC Orders ED Discharge Orders    None       Kimbley Sprague, Carola Rhine, MD 07/25/20 [phone]

## 2020-07-24 NOTE — ED Triage Notes (Signed)
Patient arrived with EMS from home with multiple episodes of bloody phlegm when coughing this evening , denies SOB , no chest pain or injury. She is taking Plavix . Denies fever or chills.

## 2020-07-24 NOTE — ED Notes (Signed)
EDP notified on patient's coughing with bloody phlegm .

## 2020-07-24 NOTE — ED Provider Notes (Signed)
Care assumed from Dr. Langston Masker, patient with hemoptysis. Labs are pending. Plan to get CT angiogram of chest if renal function is adequate.  Chest x-ray shows cardiomegaly with right pleural effusion, also concern for ascending aortic aneurysm.  Labs show mild anemia which is at patient's baseline.  D-dimer is elevated, CT angiogram has been ordered.  Labs are significant for hypokalemia, presumably secondary to diuretic use.  She is given oral and intravenous potassium.  Mild anemia present unchanged from baseline.  CT scan shows a large right hilar and infrahilar mass.  This is likely the source of her hemoptysis.  Case is discussed with Dr. Hal Hope of [facility], who agrees to admit the patient.   Delora Fuel, MD 56/38/93 [phone]

## 2020-07-25 ENCOUNTER — Emergency Department (HOSPITAL_COMMUNITY): Payer: Medicare HMO

## 2020-07-25 ENCOUNTER — Encounter (HOSPITAL_COMMUNITY): Payer: Self-pay | Admitting: Internal Medicine

## 2020-07-25 DIAGNOSIS — E876 Hypokalemia: Secondary | ICD-10-CM | POA: Diagnosis present

## 2020-07-25 DIAGNOSIS — R042 Hemoptysis: Secondary | ICD-10-CM

## 2020-07-25 DIAGNOSIS — C50411 Malignant neoplasm of upper-outer quadrant of right female breast: Secondary | ICD-10-CM

## 2020-07-25 DIAGNOSIS — Z803 Family history of malignant neoplasm of breast: Secondary | ICD-10-CM | POA: Diagnosis not present

## 2020-07-25 DIAGNOSIS — Z853 Personal history of malignant neoplasm of breast: Secondary | ICD-10-CM | POA: Diagnosis not present

## 2020-07-25 DIAGNOSIS — I10 Essential (primary) hypertension: Secondary | ICD-10-CM | POA: Diagnosis not present

## 2020-07-25 DIAGNOSIS — Z8673 Personal history of transient ischemic attack (TIA), and cerebral infarction without residual deficits: Secondary | ICD-10-CM | POA: Diagnosis not present

## 2020-07-25 DIAGNOSIS — Z9013 Acquired absence of bilateral breasts and nipples: Secondary | ICD-10-CM | POA: Diagnosis not present

## 2020-07-25 DIAGNOSIS — Z20822 Contact with and (suspected) exposure to covid-19: Secondary | ICD-10-CM | POA: Diagnosis present

## 2020-07-25 DIAGNOSIS — Z950 Presence of cardiac pacemaker: Secondary | ICD-10-CM | POA: Diagnosis not present

## 2020-07-25 DIAGNOSIS — D62 Acute posthemorrhagic anemia: Secondary | ICD-10-CM | POA: Diagnosis present

## 2020-07-25 DIAGNOSIS — K219 Gastro-esophageal reflux disease without esophagitis: Secondary | ICD-10-CM | POA: Diagnosis present

## 2020-07-25 DIAGNOSIS — I119 Hypertensive heart disease without heart failure: Secondary | ICD-10-CM | POA: Diagnosis present

## 2020-07-25 DIAGNOSIS — R918 Other nonspecific abnormal finding of lung field: Secondary | ICD-10-CM | POA: Diagnosis present

## 2020-07-25 DIAGNOSIS — Z79811 Long term (current) use of aromatase inhibitors: Secondary | ICD-10-CM | POA: Diagnosis not present

## 2020-07-25 DIAGNOSIS — I442 Atrioventricular block, complete: Secondary | ICD-10-CM | POA: Diagnosis present

## 2020-07-25 DIAGNOSIS — R591 Generalized enlarged lymph nodes: Secondary | ICD-10-CM | POA: Diagnosis present

## 2020-07-25 DIAGNOSIS — Z7982 Long term (current) use of aspirin: Secondary | ICD-10-CM | POA: Diagnosis not present

## 2020-07-25 DIAGNOSIS — T1490XA Injury, unspecified, initial encounter: Secondary | ICD-10-CM | POA: Diagnosis present

## 2020-07-25 DIAGNOSIS — Z7983 Long term (current) use of bisphosphonates: Secondary | ICD-10-CM | POA: Diagnosis not present

## 2020-07-25 DIAGNOSIS — J90 Pleural effusion, not elsewhere classified: Secondary | ICD-10-CM | POA: Diagnosis present

## 2020-07-25 DIAGNOSIS — Z87891 Personal history of nicotine dependence: Secondary | ICD-10-CM | POA: Diagnosis not present

## 2020-07-25 DIAGNOSIS — Z79899 Other long term (current) drug therapy: Secondary | ICD-10-CM | POA: Diagnosis not present

## 2020-07-25 DIAGNOSIS — Z951 Presence of aortocoronary bypass graft: Secondary | ICD-10-CM | POA: Diagnosis not present

## 2020-07-25 DIAGNOSIS — Z8249 Family history of ischemic heart disease and other diseases of the circulatory system: Secondary | ICD-10-CM | POA: Diagnosis not present

## 2020-07-25 DIAGNOSIS — E785 Hyperlipidemia, unspecified: Secondary | ICD-10-CM | POA: Diagnosis present

## 2020-07-25 DIAGNOSIS — Z170 Estrogen receptor positive status [ER+]: Secondary | ICD-10-CM

## 2020-07-25 HISTORY — DX: Hemoptysis: R04.2

## 2020-07-25 LAB — COMPREHENSIVE METABOLIC PANEL
ALT: 8 U/L (ref 0–44)
AST: 15 U/L (ref 15–41)
Albumin: 2 g/dL — ABNORMAL LOW (ref 3.5–5.0)
Alkaline Phosphatase: 40 U/L (ref 38–126)
Anion gap: 5 (ref 5–15)
BUN: 10 mg/dL (ref 8–23)
CO2: 26 mmol/L (ref 22–32)
Calcium: 8.2 mg/dL — ABNORMAL LOW (ref 8.9–10.3)
Chloride: 109 mmol/L (ref 98–111)
Creatinine, Ser: 0.67 mg/dL (ref 0.44–1.00)
GFR, Estimated: >60 mL/min (ref >60)
Glucose, Bld: 91 mg/dL (ref 70–99)
Potassium: 4.3 mmol/L (ref 3.5–5.1)
Sodium: 140 mmol/L (ref 135–145)
Total Bilirubin: 1.1 mg/dL (ref 0.3–1.2)
Total Protein: 6.1 g/dL — ABNORMAL LOW (ref 6.5–8.1)

## 2020-07-25 LAB — CBC WITH DIFFERENTIAL/PLATELET
Abs Immature Granulocytes: 0.02 10*3/uL (ref 0.00–0.07)
Basophils Absolute: 0 10*3/uL (ref 0.0–0.1)
Basophils Relative: 0 %
Eosinophils Absolute: 0.1 10*3/uL (ref 0.0–0.5)
Eosinophils Relative: 1 %
HCT: 28.5 % — ABNORMAL LOW (ref 36.0–46.0)
Hemoglobin: 8.5 g/dL — ABNORMAL LOW (ref 12.0–15.0)
Immature Granulocytes: 0 %
Lymphocytes Relative: 21 %
Lymphs Abs: 1.3 10*3/uL (ref 0.7–4.0)
MCH: 26.5 pg (ref 26.0–34.0)
MCHC: 29.8 g/dL — ABNORMAL LOW (ref 30.0–36.0)
MCV: 88.8 fL (ref 80.0–100.0)
Monocytes Absolute: 0.5 10*3/uL (ref 0.1–1.0)
Monocytes Relative: 9 %
Neutro Abs: 4 10*3/uL (ref 1.7–7.7)
Neutrophils Relative %: 69 %
Platelets: 321 10*3/uL (ref 150–400)
RBC: 3.21 MIL/uL — ABNORMAL LOW (ref 3.87–5.11)
RDW: 15.2 % (ref 11.5–15.5)
WBC: 5.9 10*3/uL (ref 4.0–10.5)
nRBC: 0 % (ref 0.0–0.2)

## 2020-07-25 LAB — ABO/RH: ABO/RH(D): B POS

## 2020-07-25 LAB — MAGNESIUM: Magnesium: 1.3 mg/dL — ABNORMAL LOW (ref 1.7–2.4)

## 2020-07-25 LAB — TROPONIN I (HIGH SENSITIVITY): Troponin I (High Sensitivity): 25 ng/L — ABNORMAL HIGH (ref <18)

## 2020-07-25 MED ORDER — MAGNESIUM SULFATE 2 GM/50ML IV SOLN
2.0000 g | Freq: Once | INTRAVENOUS | Status: AC
  Administered 2020-07-25: 2 g via INTRAVENOUS
  Filled 2020-07-25: qty 50

## 2020-07-25 MED ORDER — POTASSIUM CHLORIDE 10 MEQ/100ML IV SOLN
INTRAVENOUS | Status: AC
  Administered 2020-07-25: 10 meq via INTRAVENOUS
  Filled 2020-07-25: qty 100

## 2020-07-25 MED ORDER — AMLODIPINE BESYLATE 5 MG PO TABS
5.0000 mg | ORAL_TABLET | Freq: Every day | ORAL | Status: DC
  Administered 2020-07-25 – 2020-07-26 (×2): 5 mg via ORAL
  Filled 2020-07-25 (×2): qty 1

## 2020-07-25 MED ORDER — POTASSIUM CHLORIDE 10 MEQ/100ML IV SOLN
10.0000 meq | INTRAVENOUS | Status: DC
Start: 2020-07-25 — End: 2020-07-25
  Administered 2020-07-25: 10 meq via INTRAVENOUS
  Filled 2020-07-25: qty 100

## 2020-07-25 MED ORDER — PANTOPRAZOLE SODIUM 40 MG PO TBEC
40.0000 mg | DELAYED_RELEASE_TABLET | Freq: Every day | ORAL | Status: DC
  Administered 2020-07-25 – 2020-07-26 (×2): 40 mg via ORAL
  Filled 2020-07-25 (×2): qty 1

## 2020-07-25 MED ORDER — LETROZOLE 2.5 MG PO TABS
2.5000 mg | ORAL_TABLET | Freq: Every day | ORAL | Status: DC
  Administered 2020-07-25 – 2020-07-26 (×2): 2.5 mg via ORAL
  Filled 2020-07-25 (×3): qty 1

## 2020-07-25 MED ORDER — IOHEXOL 350 MG/ML SOLN
75.0000 mL | Freq: Once | INTRAVENOUS | Status: AC | PRN
  Administered 2020-07-25: 75 mL via INTRAVENOUS

## 2020-07-25 MED ORDER — HYDRALAZINE HCL 20 MG/ML IJ SOLN: 10.0000 mg | INTRAMUSCULAR | Status: DC | PRN

## 2020-07-25 MED ORDER — LATANOPROST 0.005 % OP SOLN
1.0000 [drp] | Freq: Every day | OPHTHALMIC | Status: DC
  Administered 2020-07-25: 1 [drp] via OPHTHALMIC
  Filled 2020-07-25 (×2): qty 2.5

## 2020-07-25 MED ORDER — POTASSIUM CHLORIDE CRYS ER 20 MEQ PO TBCR
40.0000 meq | EXTENDED_RELEASE_TABLET | Freq: Once | ORAL | Status: AC
  Administered 2020-07-25: 40 meq via ORAL
  Filled 2020-07-25: qty 2

## 2020-07-25 MED ORDER — ALLOPURINOL 100 MG PO TABS
100.0000 mg | ORAL_TABLET | Freq: Every day | ORAL | Status: DC
  Administered 2020-07-25 – 2020-07-26 (×2): 100 mg via ORAL
  Filled 2020-07-25 (×2): qty 1

## 2020-07-25 MED ORDER — SIMVASTATIN 20 MG PO TABS
40.0000 mg | ORAL_TABLET | Freq: Every day | ORAL | Status: DC
  Administered 2020-07-25: 40 mg via ORAL
  Filled 2020-07-25: qty 2

## 2020-07-25 NOTE — H&P (View-Only) (Signed)
NAME:  [name], MRN:  [id number], DOB:  1933/09/10, LOS: 0 ADMISSION DATE:  07/24/2020, CONSULTATION DATE:  3/28 REFERRING MD:  Cathlean Sauer, CHIEF COMPLAINT:  Lung mass   History of Present Illness:  This is a delightful 87 year old female who lives independently still.  She was in her usual state of health up until the evening hours of 3/27 when she began to have cough productive of teaspoon size hemoptysis.  She had had no sick exposures, no chest pain, no shortness of breath, no fevers or chills.  She was typically on Plavix.  Because of this new hemoptysis she presented to the emergency room for evaluation CT imaging of chest showed large right infrahilar lung mass with airway compression pulmonary asked to evaluate  Pertinent  Medical History  Ho breast cancer. H/o lumpectomy, still on Letrozole, prior stroke, CHB, HTN, chronic anemia  Significant Hospital Events: Including procedures, antibiotic start and stop dates in addition to other pertinent events   . 3/28 admitted for hemoptysis.  CT chest showed large right infrahilar lung mass with compression of the right lower lobe bronchus.  Was given nebulized tranexamic acid in the emergency room with resolution of hemoptysis pulmonary consulted  Interim History / Subjective:  Feels better  Objective   Blood pressure 126/65, pulse 66, temperature 98.1 F (36.7 C), temperature source Oral, resp. rate 16, height _0  (1.676 m), weight 75 kg, SpO2 98 %.        Intake/Output Summary (Last 24 hours) at 07/25/2020 1328 Last data filed at 07/25/2020 0749 Gross per 24 hour  Intake 50 ml  Output no documentation  Net 50 ml   Filed Weights   07/24/20 2231  Weight: 75 kg    Examination: General: Pleasant 87 year old female resting in bed no acute distress HENT: Normocephalic atraumatic no jugular venous distention Lungs: Clear to auscultation currently on nasal cannula Cardiovascular: Regular rate and rhythm Abdomen: Soft not  tender Extremities: Warm dry Neuro: Awake oriented GU: Due to void  Labs/imaging that I havepersonally reviewed  (right click and "Reselect all SmartList Selections" daily)  CT chest image showed a large right infrahilar lung mass centrally located with occlusion of bronchus intermedius and noted post obstructive atelectasis  Resolved Hospital Problem list     Assessment & Plan:  Remote history of tobacco abuse History of left-sided breast cancer with lumpectomy Prior history of complete heart block with pacemaker History of CVA on Plavix Right lower lobe lung mass with associated hemoptysis   Central right lower lobe lung mass with post obstructive atelectasis and associated hemoptysis Hemoptysis resolved after inhaled transemic acid  Looks amenable to bronchoscopy.  Certainly concern for a malignancy here Plan Continue to hold Plavix and aspirin Dr. Vaughan Browner to follow, to discuss possible bronchoscopy with patient   Best practice (right click and "Reselect all SmartList Selections" daily)  Per primary   Labs   CBC: Recent Labs  Lab 07/24/20 2235 07/25/20 0422  WBC 6.0 5.9  NEUTROABS 4.1 4.0  HGB 10.2* 8.5*  HCT 32.9* 28.5*  MCV 86.4 88.8  PLT 370 782    Basic Metabolic Panel: Recent Labs  Lab 07/24/20 2235 07/25/20 0105 07/25/20 0422  NA 139  --  140  K 2.8*  --  4.3  CL 102  --  109  CO2 29  --  26  GLUCOSE 107*  --  91  BUN 11  --  10  CREATININE 0.79  --  0.67  CALCIUM 8.9  --  8.2*  MG  --  1.3*  --    GFR: Estimated Creatinine Clearance: 54.2 mL/min (by C-G formula based on SCr of 0.67 mg/dL). Recent Labs  Lab 07/24/20 2235 07/25/20 0422  WBC 6.0 5.9    Liver Function Tests: Recent Labs  Lab 07/25/20 0422  AST 15  ALT 8  ALKPHOS 40  BILITOT 1.1  PROT 6.1*  ALBUMIN 2.0*   No results for input(s): LIPASE, AMYLASE in the last 168 hours. No results for input(s): AMMONIA in the last 168 hours.  ABG No results found for: PHART,  PCO2ART, PO2ART, HCO3, TCO2, ACIDBASEDEF, O2SAT   Coagulation Profile: Recent Labs  Lab 07/24/20 2235  INR 1.1    Cardiac Enzymes: No results for input(s): CKTOTAL, CKMB, CKMBINDEX, TROPONINI in the last 168 hours.  HbA1C: Hgb A1c MFr Bld  Date/Time Value Ref Range Status  05/28/2018 03:53 AM 6.3 (H) 4.8 - 5.6 % Final    Comment:    (NOTE) Pre diabetes:          5.7%-6.4% Diabetes:              >6.4% Glycemic control for   <7.0% adults with diabetes     CBG: No results for input(s): GLUCAP in the last 168 hours.  Review of Systems:   Review of Systems  Constitutional: Positive for weight loss. Negative for chills and fever.       She estimates about 10 pounds of unintentional weight loss over the last 2 weeks, more weight loss over the month prior  HENT: Negative for congestion, nosebleeds and sinus pain.   Eyes: Negative.   Respiratory: Positive for cough and hemoptysis. Negative for shortness of breath.   Cardiovascular: Negative for chest pain, claudication and leg swelling.  Gastrointestinal: Negative.   Genitourinary: Negative.   Musculoskeletal: Negative.   Neurological: Negative.   Endo/Heme/Allergies: Negative.   Psychiatric/Behavioral: Negative.      Past Medical History:  She,  has a past medical history of Complete heart block ([place]), Gait abnormality (07/23/2016), GERD (gastroesophageal reflux disease), Hemoptysis (07/25/2020), History of hypertension, History of stroke, Hyperhomocysteinemia ([place]), Hyperlipidemia, Hypertension, Hypokalemia (07/13/2018), Low blood magnesium (07/13/2018), Pacemaker-Medtronic (08/07/2011), Stroke ([place]), and Vomiting (07/13/2018).   Surgical History:   Past Surgical History:  Procedure Laterality Date  . BIOPSY  07/15/2018   Procedure: BIOPSY;  Surgeon: [name], MD;  Location: [facility];  Service: Gastroenterology;;  . BREAST BIOPSY Right 10/27/2018   affirm bx of mass, x clip,  INVASIVE MAMMARY CARCINOMA  . BREAST  LUMPECTOMY Right 12/03/2018  . CARDIAC CATHETERIZATION    . ESOPHAGOGASTRODUODENOSCOPY Left 07/15/2018   Procedure: ESOPHAGOGASTRODUODENOSCOPY (EGD);  Surgeon: [name], MD;  Location: [facility];  Service: Gastroenterology;  Laterality: Left;  . PACEMAKER INSERTION     Medtronic Enpulse dual-chamber pacemaker  . PARTIAL MASTECTOMY WITH NEEDLE LOCALIZATION AND AXILLARY SENTINEL LYMPH NODE BX Right 12/03/2018   Procedure: PARTIAL MASTECTOMY WITH NEEDLE LOCALIZATION AND AXILLARY SENTINEL LYMPH NODE BX, RIGHT;  Surgeon: [name], MD;  Location: [facility] ORS;  Service: General;  Laterality: Right;  . PERMANENT PACEMAKER GENERATOR CHANGE N/A 07/30/2013   Procedure: PERMANENT PACEMAKER GENERATOR CHANGE;  Surgeon: [name], MD;  Location: [facility];  Service: Cardiovascular;  Laterality: N/A;     Social History:   reports that she quit smoking about 14 years ago. She has never used smokeless tobacco. She reports that she does not drink alcohol and does not use drugs.   Family History:  Her family history includes Breast cancer in her sister and sister; Hypertension in her mother. There is no history of Colon cancer or Esophageal cancer.   Allergies No Known Allergies   Home Medications  Prior to Admission medications   Medication Sig Start Date End Date Taking? Authorizing Provider  acetaminophen (TYLENOL) 500 MG tablet Take 500-1,000 mg by mouth every 6 (six) hours as needed for moderate pain.     [provider]  alendronate (FOSAMAX) 70 MG tablet TAKE 1 TABLET (70 MG TOTAL) BY MOUTH ONCE A WEEK. TAKE WITH A FULL GLASS OF WATER ON AN EMPTY STOMACH. 03/14/20   [name], MD  allopurinol (ZYLOPRIM) 100 MG tablet Take 100 mg by mouth daily. 11/28/19   [provider]  amLODipine (NORVASC) 5 MG tablet Take 5 mg by mouth daily.    [provider]  aspirin 81 MG EC tablet Take 81 mg by mouth daily.     [provider]  Blood Pressure  KIT Automated blood pressure measuring device. 06/29/18   [name], MD  clopidogrel (PLAVIX) 75 MG tablet Take 75 mg by mouth daily with breakfast.    [provider]  hydrochlorothiazide (HYDRODIURIL) 25 MG tablet Take 25 mg by mouth daily. 10/19/19   [provider]  letrozole ([place]) 2.5 MG tablet TAKE 1 TABLET BY MOUTH EVERY DAY 09/30/19   Finnegan, [name], MD  LUMIGAN 0.01 % SOLN Place 1 drop into both eyes at bedtime. 01/13/18   [provider]  mirtazapine (REMERON) 7.5 MG tablet Take 7.5 mg by mouth at bedtime.  Patient not taking: Reported on 06/28/2020 09/11/18   [provider]  pantoprazole (PROTONIX) 40 MG tablet Take 40 mg by mouth daily.    [provider]  Potassium 99 MG TABS Take 99 mg by mouth daily.    [provider]  simvastatin (ZOCOR) 40 MG tablet Take 1 tablet (40 mg total) by mouth daily at 6 PM. 06/28/20   [name], MD     Critical care time: NA   Erick Colace ACNP-BC [place] Pager # [phone] OR # [phone] if no answer

## 2020-07-25 NOTE — ED Notes (Signed)
Placed patinet on 2L [place] due to patient desaturating to 84% on RA. Pt 98% at this time.

## 2020-07-25 NOTE — Progress Notes (Signed)
NEW ADMISSION NOTE  Arrival Method: bed Mental Orientation: Alert and oriented x4 Telemetry: yes Assessment: Completed Skin: see notes Iv: left forearm Pain: 0 Tubes: 0 Safety Measures: Safety Fall Prevention Plan has been given, discussed and signed Admission: Completed [facility] Orientation: Patient has been orientated to the room, unit and staff.  Family: 0  Orders have been reviewed and implemented. Will continue to monitor the patient. Call light has been placed within reach and bed alarm has been activated.    Beatris Ship, RN

## 2020-07-25 NOTE — ED Notes (Signed)
Patient transported to CT scan . 

## 2020-07-25 NOTE — ED Notes (Signed)
No cough at this time , no bleeding .

## 2020-07-25 NOTE — H&P (Addendum)
History and Physical    [name] TDS:[id number] DOB: 03/18/1934 DOA: 07/24/2020  PCP: Philmore Pali, NP  Patient coming from: Home.  Chief Complaint: Hemoptysis.  HPI: [name] is a 87 y.o. female with known history of breast cancer status post lumpectomy in August 2020 presently on letrozole, history of stroke on aspirin Plavix, complete heart block status post pacemaker placement, hypertension, chronic anemia presents to the ER with complaints of having had multiple episodes of hemoptysis over the last 48 hours.  Denies any chest pain productive cough fever chills.  ED Course: In the ER patient was hemodynamically stable.  CT angiogram of the chest shows large right hilar and infrahilar mass with pressure on the bronchus.  Patient was given nebulized tranexamic acid.  After admission so far patient has not had any further episodes of hemoptysis.  Patient admitted for further observation.  Hemoglobin is around 10.2 at baseline.  Patient's potassium and magnesium was low for which patient was given replacement.  Covid test is negative.  INR 1.1.  Review of Systems: As per HPI, rest all negative.   Past Medical History:  Diagnosis Date  . Complete heart block ([place])   . Gait abnormality 07/23/2016  . GERD (gastroesophageal reflux disease)   . History of hypertension   . History of stroke   . Hyperhomocysteinemia ([place])   . Hyperlipidemia   . Hypertension   . Hypokalemia 07/13/2018  . Low blood magnesium 07/13/2018  . Pacemaker-Medtronic 08/07/2011  . Stroke [facility])    left sided weakness  . Vomiting 07/13/2018    Past Surgical History:  Procedure Laterality Date  . BIOPSY  07/15/2018   Procedure: BIOPSY;  Surgeon: [name], MD;  Location: [facility];  Service: Gastroenterology;;  . BREAST BIOPSY Right 10/27/2018   affirm bx of mass, x clip,  INVASIVE MAMMARY CARCINOMA  . BREAST LUMPECTOMY Right 12/03/2018  . CARDIAC CATHETERIZATION    .  ESOPHAGOGASTRODUODENOSCOPY Left 07/15/2018   Procedure: ESOPHAGOGASTRODUODENOSCOPY (EGD);  Surgeon: [name], MD;  Location: [facility];  Service: Gastroenterology;  Laterality: Left;  . PACEMAKER INSERTION     Medtronic Enpulse dual-chamber pacemaker  . PARTIAL MASTECTOMY WITH NEEDLE LOCALIZATION AND AXILLARY SENTINEL LYMPH NODE BX Right 12/03/2018   Procedure: PARTIAL MASTECTOMY WITH NEEDLE LOCALIZATION AND AXILLARY SENTINEL LYMPH NODE BX, RIGHT;  Surgeon: [name], MD;  Location: [facility] ORS;  Service: General;  Laterality: Right;  . PERMANENT PACEMAKER GENERATOR CHANGE N/A 07/30/2013   Procedure: PERMANENT PACEMAKER GENERATOR CHANGE;  Surgeon: [name], MD;  Location: [facility] CATH LAB;  Service: Cardiovascular;  Laterality: N/A;     reports that she quit smoking about 14 years ago. She has never used smokeless tobacco. She reports that she does not drink alcohol and does not use drugs.  No Known Allergies  Family History  Problem Relation Age of Onset  . Hypertension Mother   . Breast cancer Sister   . Breast cancer Sister   . Colon cancer Neg Hx   . Esophageal cancer Neg Hx     Prior to Admission medications   Medication Sig Start Date End Date Taking? Authorizing Provider  acetaminophen (TYLENOL) 500 MG tablet Take 500-1,000 mg by mouth every 6 (six) hours as needed for moderate pain.     [provider]  alendronate (FOSAMAX) 70 MG tablet TAKE 1 TABLET (70 MG TOTAL) BY MOUTH ONCE A WEEK. TAKE WITH A FULL GLASS OF WATER ON AN EMPTY STOMACH. 03/14/20  [name], MD  allopurinol (ZYLOPRIM) 100 MG tablet Take 100 mg by mouth daily. 11/28/19   [provider]  amLODipine (NORVASC) 5 MG tablet Take 5 mg by mouth daily.    [provider]  aspirin 81 MG EC tablet Take 81 mg by mouth daily.     [provider]  Blood Pressure KIT Automated blood pressure measuring device. 06/29/18   [name], MD  clopidogrel (PLAVIX) 75 MG  tablet Take 75 mg by mouth daily with breakfast.    [provider]  hydrochlorothiazide (HYDRODIURIL) 25 MG tablet Take 25 mg by mouth daily. 10/19/19   [provider]  letrozole ([place]) 2.5 MG tablet TAKE 1 TABLET BY MOUTH EVERY DAY 09/30/19   Finnegan, [name], MD  LUMIGAN 0.01 % SOLN Place 1 drop into both eyes at bedtime. 01/13/18   [provider]  mirtazapine (REMERON) 7.5 MG tablet Take 7.5 mg by mouth at bedtime.  Patient not taking: Reported on 06/28/2020 09/11/18   [provider]  pantoprazole (PROTONIX) 40 MG tablet Take 40 mg by mouth daily.    [provider]  Potassium 99 MG TABS Take 99 mg by mouth daily.    [provider]  simvastatin (ZOCOR) 40 MG tablet Take 1 tablet (40 mg total) by mouth daily at 6 PM. 06/28/20   [name], MD    Physical Exam: Constitutional: Moderately built and nourished. Vitals:   07/25/20 0045 07/25/20 0145 07/25/20 0230 07/25/20 0400  BP: (!) 149/79 (!) 126/102 129/76 (!) 120/52  Pulse: (!) 59 72 81 79  Resp: (!) 21 19 (!) 24 (!) 24  Temp:      TempSrc:      SpO2: 94% 92% 93% 95%  Weight:      Height:       Eyes: Anicteric no pallor. ENMT: No discharge from the ears eyes nose or mouth. Neck: No mass felt.  No neck rigidity. Respiratory: No rhonchi or crepitations. Cardiovascular: S1-S2 heard. Abdomen: Soft nontender bowel sounds present. Musculoskeletal: No edema. Skin: No rash. Neurologic: Alert awake oriented to time place and person.  Moves all extremities. Psychiatric: Appears normal.  Normal affect.   Labs on Admission: I have personally reviewed following labs and imaging studies  CBC: Recent Labs  Lab 07/24/20 2235  WBC 6.0  NEUTROABS 4.1  HGB 10.2*  HCT 32.9*  MCV 86.4  PLT 665   Basic Metabolic Panel: Recent Labs  Lab 07/24/20 2235 07/25/20 0105  NA 139  --   K 2.8*  --   CL 102  --   CO2 29  --   GLUCOSE 107*  --   BUN 11  --   CREATININE 0.79  --    CALCIUM 8.9  --   MG  --  1.3*   GFR: Estimated Creatinine Clearance: 54.2 mL/min (by C-G formula based on SCr of 0.79 mg/dL). Liver Function Tests: No results for input(s): AST, ALT, ALKPHOS, BILITOT, PROT, ALBUMIN in the last 168 hours. No results for input(s): LIPASE, AMYLASE in the last 168 hours. No results for input(s): AMMONIA in the last 168 hours. Coagulation Profile: Recent Labs  Lab 07/24/20 2235  INR 1.1   Cardiac Enzymes: No results for input(s): CKTOTAL, CKMB, CKMBINDEX, TROPONINI in the last 168 hours. BNP (last 3 results) No results for input(s): PROBNP in the last 8760 hours. HbA1C: No results for input(s): HGBA1C in the last 72 hours. CBG: No results for input(s): GLUCAP  in the last 168 hours. Lipid Profile: No results for input(s): CHOL, HDL, LDLCALC, TRIG, CHOLHDL, LDLDIRECT in the last 72 hours. Thyroid Function Tests: No results for input(s): TSH, T4TOTAL, FREET4, T3FREE, THYROIDAB in the last 72 hours. Anemia Panel: No results for input(s): VITAMINB12, FOLATE, FERRITIN, TIBC, IRON, RETICCTPCT in the last 72 hours. Urine analysis:    Component Value Date/Time   COLORURINE YELLOW 07/13/2018 1420   APPEARANCEUR HAZY (A) 07/13/2018 1420   LABSPEC 1.017 07/13/2018 1420   PHURINE 5.0 07/13/2018 1420   GLUCOSEU NEGATIVE 07/13/2018 1420   HGBUR SMALL (A) 07/13/2018 1420   BILIRUBINUR NEGATIVE 07/13/2018 1420   KETONESUR 5 (A) 07/13/2018 1420   PROTEINUR 30 (A) 07/13/2018 1420   NITRITE NEGATIVE 07/13/2018 1420   LEUKOCYTESUR NEGATIVE 07/13/2018 1420   Sepsis Labs: _0 (procalcitonin:4,lacticidven:4) ) Recent Results (from the past 240 hour(s))  Resp Panel by RT-PCR (Flu A&B, Covid) Nasopharyngeal Swab     Status: None   Collection Time: 07/24/20 10:37 PM   Specimen: Nasopharyngeal Swab; Nasopharyngeal(NP) swabs in vial transport medium  Result Value Ref Range Status   SARS Coronavirus 2 by RT PCR NEGATIVE NEGATIVE Final    Comment:  (NOTE) SARS-CoV-2 target nucleic acids are NOT DETECTED.  The SARS-CoV-2 RNA is generally detectable in upper respiratory specimens during the acute phase of infection. The lowest concentration of SARS-CoV-2 viral copies this assay can detect is 138 copies/mL. A negative result does not preclude SARS-Cov-2 infection and should not be used as the sole basis for treatment or other patient management decisions. A negative result may occur with  improper specimen collection/handling, submission of specimen other than nasopharyngeal swab, presence of viral mutation(s) within the areas targeted by this assay, and inadequate number of viral copies(<138 copies/mL). A negative result must be combined with clinical observations, patient history, and epidemiological information. The expected result is Negative.  Fact Sheet for Patients:  EntrepreneurPulse.com.au  Fact Sheet for Healthcare Providers:  IncredibleEmployment.be  This test is no t yet approved or cleared by the [place] FDA and  has been authorized for detection and/or diagnosis of SARS-CoV-2 by FDA under an Emergency Use Authorization (EUA). This EUA will remain  in effect (meaning this test can be used) for the duration of the COVID-19 declaration under Section 564(b)(1) of the Act, 21 U.S.C.section 360bbb-3(b)(1), unless the authorization is terminated  or revoked sooner.       Influenza A by PCR NEGATIVE NEGATIVE Final   Influenza B by PCR NEGATIVE NEGATIVE Final    Comment: (NOTE) The Xpert Xpress SARS-CoV-2/FLU/RSV plus assay is intended as an aid in the diagnosis of influenza from Nasopharyngeal swab specimens and should not be used as a sole basis for treatment. Nasal washings and aspirates are unacceptable for Xpert Xpress SARS-CoV-2/FLU/RSV testing.  Fact Sheet for Patients: EntrepreneurPulse.com.au  Fact Sheet for Healthcare  Providers: IncredibleEmployment.be  This test is not yet approved or cleared by the [place] FDA and has been authorized for detection and/or diagnosis of SARS-CoV-2 by FDA under an Emergency Use Authorization (EUA). This EUA will remain in effect (meaning this test can be used) for the duration of the COVID-19 declaration under Section 564(b)(1) of the Act, 21 U.S.C. section 360bbb-3(b)(1), unless the authorization is terminated or revoked.  Performed at [facility], [place] [address], [place], [place] [address]      Radiological Exams on Admission: CT Angio Chest PE W and/or Wo Contrast  Result Date: 07/25/2020 CLINICAL DATA:  Hemoptysis and chest pain  EXAM: CT ANGIOGRAPHY CHEST WITH CONTRAST TECHNIQUE: Multidetector CT imaging of the chest was performed using the standard protocol during bolus administration of intravenous contrast. Multiplanar CT image reconstructions and MIPs were obtained to evaluate the vascular anatomy. CONTRAST:  59m OMNIPAQUE IOHEXOL 350 MG/ML SOLN COMPARISON:  Chest x-ray from the previous day. FINDINGS: Cardiovascular: Mild atherosclerotic calcifications of the aorta are noted. No aneurysmal dilatation is seen. Pacing device is noted. No cardiac enlargement is seen. The pulmonary artery shows a normal branching pattern on the on the left. No filling defects are identified to suggest pulmonary embolism. On the right there is significant mass effect upon the pulmonary arterial branches by a large central hilar mass. No definitive filling defect is identified. Significant narrowing is noted within the left innominate vein likely related to the prior pacemaker placement. Multiple chest wall and neck collaterals are seen. Mediastinum/Nodes: Thoracic inlet is within normal limits. The esophagus is within normal limits. Considerable right-sided hilar mass is noted with adenopathy and occlusion of the bronchus intermedius with lower lobe  consolidation. Associated subcarinal adenopathy is noted as well. Small hilar nodes are noted on left but not significant by size criteria. Lungs/Pleura: Left lung is well aerated without focal infiltrate or sizable effusion. Right lung demonstrates consolidation in the medial aspect of the lower lobe secondary to the central mass lesion. The right hilar component seen on image number 181 of series 7 measures 4.5 x 3.4 cm in greatest dimension. Additionally a larger infrahilar component is noted which measures approximately 5.8 by at least 4.5 cm. Occlusion of the bronchus intermedius is noted in these changes are consistent with pulmonary neoplasm till proven otherwise. Upper Abdomen: Visualized upper abdomen is within normal limits. Musculoskeletal: Degenerative changes of the thoracic spine are noted. Review of the MIP images confirms the above findings. IMPRESSION: No evidence of pulmonary emboli. Large right hilar and infrahilar mass lesion with compression on the central aspect of the right pulmonary artery with evidence of occlusion of the bronchus intermedius and significant lower lobe consolidation. Associated subcarinal adenopathy is noted as well. Bronchoscopic evaluation is recommended Narrowing in the left innominate vein related to prior pacemaker placement. Multiple chest wall and neck collaterals are noted. Aortic Atherosclerosis (ICD10-I70.0). Electronically Signed   By: MInez CatalinaM.D.   On: 07/25/2020 00:59   DG Chest Port 1 View  Result Date: 07/24/2020 CLINICAL DATA:  Fall with multiple episodes of bloody phlegm. EXAM: PORTABLE CHEST 1 VIEW COMPARISON:  Chest radiograph July 13, 2018. FINDINGS: Cardiomegaly. Atherosclerotic tortuous thoracic aorta. Right rib deviation of the trachea, slightly increased from prior. Moderate right pleural effusion with right basilar atelectasis. Left lung is clear. No displaced rib fracture visualized. IMPRESSION: 1. Moderate right pleural effusion with  right basilar atelectasis. 2. Cardiomegaly. 3. Atherosclerotic tortuous thoracic aorta with rightward deviation of the trachea, which is in part accentuated by technique but is suggestive of ascending thoracic aortic aneurysm. Consider further evaluation with chest CT with contrast. Electronically Signed   By: JDahlia BailiffMD   On: 07/24/2020 22:48    EKG: Independently reviewed.  Patient is complete heart block with pacemaker.  Assessment/Plan Principal Problem:   Hemoptysis Active Problems:   Pacemaker-Medtronic   Essential hypertension   Malignant neoplasm of upper-outer quadrant of right breast in female, estrogen receptor positive (HCC)   Lung mass    1. Hemoptysis with CT scan showing large hilar and infrahilar mass likely cause of the hemoptysis.  Will consult her pulmonologist for further recommendation.  We will hold off patient's aspirin and Plavix.  Follow CBC.  Follow respiratory status. 2. Hypokalemia and hypomagnesemia likely from diuretics.  Hold HCTZ.  Replace potassium and magnesium and follow metabolic panel. 3. Hypertension on amlodipine.  Holding hydrochlorothiazide due to electrolyte derangements.  As needed IV hydralazine for systolic more than 251. 4. Breast cancer on letrozole.  See #1. 5. Anemia appears to be chronic follow CBC closely since patient is having hemoptysis. 6. History of complete heart block status post pacemaker placement. 7. History of stroke presently holding antiplatelet agents due to hemoptysis.  On statins.  Patient agreeable on holding the antiplatelet agents.   DVT prophylaxis: SCDs.  Avoiding anticoagulation in the setting of hemoptysis. Code Status: Full code. Family Communication: Family at the bedside. Disposition Plan: Home. Consults called: We will consult pulmonologist. Admission status: Observation.   Rise Patience MD [facility] Pager [phone].  If 7PM-7AM, please contact  night-coverage [email or web address] Password [facility]  07/25/2020, 4:22 AM

## 2020-07-25 NOTE — Consult Note (Signed)
 NAME:  [name], MRN:  [id number], DOB:  08/13/1933, LOS: 0 ADMISSION DATE:  07/24/2020, CONSULTATION DATE:  3/28 REFERRING MD:  Arrien, CHIEF COMPLAINT:  Lung mass   History of Present Illness:  This is a delightful 86-year-old female who lives independently still.  She was in her usual state of health up until the evening hours of 3/27 when she began to have cough productive of teaspoon size hemoptysis.  She had had no sick exposures, no chest pain, no shortness of breath, no fevers or chills.  She was typically on Plavix.  Because of this new hemoptysis she presented to the emergency room for evaluation CT imaging of chest showed large right infrahilar lung mass with airway compression pulmonary asked to evaluate  Pertinent  Medical History  Ho breast cancer. H/o lumpectomy, still on Letrozole, prior stroke, CHB, HTN, chronic anemia  Significant Hospital Events: Including procedures, antibiotic start and stop dates in addition to other pertinent events   . 3/28 admitted for hemoptysis.  CT chest showed large right infrahilar lung mass with compression of the right lower lobe bronchus.  Was given nebulized tranexamic acid in the emergency room with resolution of hemoptysis pulmonary consulted  Interim History / Subjective:  Feels better  Objective   Blood pressure 126/65, pulse 66, temperature 98.1 F (36.7 C), temperature source Oral, resp. rate 16, height 5' 6" (1.676 m), weight 75 kg, SpO2 98 %.        Intake/Output Summary (Last 24 hours) at 07/25/2020 1328 Last data filed at 07/25/2020 0749 Gross per 24 hour  Intake 50 ml  Output no documentation  Net 50 ml   Filed Weights   07/24/20 2231  Weight: 75 kg    Examination: General: Pleasant 86-year-old female resting in bed no acute distress HENT: Normocephalic atraumatic no jugular venous distention Lungs: Clear to auscultation currently on nasal cannula Cardiovascular: Regular rate and rhythm Abdomen: Soft not  tender Extremities: Warm dry Neuro: Awake oriented GU: Due to void  Labs/imaging that I havepersonally reviewed  (right click and "Reselect all SmartList Selections" daily)  CT chest image showed a large right infrahilar lung mass centrally located with occlusion of bronchus intermedius and noted post obstructive atelectasis  Resolved Hospital Problem list     Assessment & Plan:  Remote history of tobacco abuse History of left-sided breast cancer with lumpectomy Prior history of complete heart block with pacemaker History of CVA on Plavix Right lower lobe lung mass with associated hemoptysis   Central right lower lobe lung mass with post obstructive atelectasis and associated hemoptysis Hemoptysis resolved after inhaled transemic acid  Looks amenable to bronchoscopy.  Certainly concern for a malignancy here Plan Continue to hold Plavix and aspirin Dr. Mannam to follow, to discuss possible bronchoscopy with patient   Best practice (right click and "Reselect all SmartList Selections" daily)  Per primary   Labs   CBC: Recent Labs  Lab 07/24/20 2235 07/25/20 0422  WBC 6.0 5.9  NEUTROABS 4.1 4.0  HGB 10.2* 8.5*  HCT 32.9* 28.5*  MCV 86.4 88.8  PLT 370 321    Basic Metabolic Panel: Recent Labs  Lab 07/24/20 2235 07/25/20 0105 07/25/20 0422  NA 139  --  140  K 2.8*  --  4.3  CL 102  --  109  CO2 29  --  26  GLUCOSE 107*  --  91  BUN 11  --  10  CREATININE 0.79  --  0.67  CALCIUM 8.9  --    8.2*  MG  --  1.3*  --    GFR: Estimated Creatinine Clearance: 54.2 mL/min (by C-G formula based on SCr of 0.67 mg/dL). Recent Labs  Lab 07/24/20 2235 07/25/20 0422  WBC 6.0 5.9    Liver Function Tests: Recent Labs  Lab 07/25/20 0422  AST 15  ALT 8  ALKPHOS 40  BILITOT 1.1  PROT 6.1*  ALBUMIN 2.0*   No results for input(s): LIPASE, AMYLASE in the last 168 hours. No results for input(s): AMMONIA in the last 168 hours.  ABG No results found for: PHART,  PCO2ART, PO2ART, HCO3, TCO2, ACIDBASEDEF, O2SAT   Coagulation Profile: Recent Labs  Lab 07/24/20 2235  INR 1.1    Cardiac Enzymes: No results for input(s): CKTOTAL, CKMB, CKMBINDEX, TROPONINI in the last 168 hours.  HbA1C: Hgb A1c MFr Bld  Date/Time Value Ref Range Status  05/28/2018 03:53 AM 6.3 (H) 4.8 - 5.6 % Final    Comment:    (NOTE) Pre diabetes:          5.7%-6.4% Diabetes:              >6.4% Glycemic control for   <7.0% adults with diabetes     CBG: No results for input(s): GLUCAP in the last 168 hours.  Review of Systems:   Review of Systems  Constitutional: Positive for weight loss. Negative for chills and fever.       She estimates about 10 pounds of unintentional weight loss over the last 2 weeks, more weight loss over the month prior  HENT: Negative for congestion, nosebleeds and sinus pain.   Eyes: Negative.   Respiratory: Positive for cough and hemoptysis. Negative for shortness of breath.   Cardiovascular: Negative for chest pain, claudication and leg swelling.  Gastrointestinal: Negative.   Genitourinary: Negative.   Musculoskeletal: Negative.   Neurological: Negative.   Endo/Heme/Allergies: Negative.   Psychiatric/Behavioral: Negative.      Past Medical History:  She,  has a past medical history of Complete heart block (HCC), Gait abnormality (07/23/2016), GERD (gastroesophageal reflux disease), Hemoptysis (07/25/2020), History of hypertension, History of stroke, Hyperhomocysteinemia (HCC), Hyperlipidemia, Hypertension, Hypokalemia (07/13/2018), Low blood magnesium (07/13/2018), Pacemaker-Medtronic (08/07/2011), Stroke (HCC), and Vomiting (07/13/2018).   Surgical History:   Past Surgical History:  Procedure Laterality Date  . BIOPSY  07/15/2018   Procedure: BIOPSY;  Surgeon: Gessner, Carl E, MD;  Location: [facility] ENDOSCOPY;  Service: Gastroenterology;;  . BREAST BIOPSY Right 10/27/2018   affirm bx of mass, x clip,  INVASIVE MAMMARY CARCINOMA  . BREAST  LUMPECTOMY Right 12/03/2018  . CARDIAC CATHETERIZATION    . ESOPHAGOGASTRODUODENOSCOPY Left 07/15/2018   Procedure: ESOPHAGOGASTRODUODENOSCOPY (EGD);  Surgeon: Gessner, Carl E, MD;  Location: [facility] ENDOSCOPY;  Service: Gastroenterology;  Laterality: Left;  . PACEMAKER INSERTION     Medtronic Enpulse dual-chamber pacemaker  . PARTIAL MASTECTOMY WITH NEEDLE LOCALIZATION AND AXILLARY SENTINEL LYMPH NODE BX Right 12/03/2018   Procedure: PARTIAL MASTECTOMY WITH NEEDLE LOCALIZATION AND AXILLARY SENTINEL LYMPH NODE BX, RIGHT;  Surgeon: Cintron-Diaz, Edgardo, MD;  Location: [facility] ORS;  Service: General;  Laterality: Right;  . PERMANENT PACEMAKER GENERATOR CHANGE N/A 07/30/2013   Procedure: PERMANENT PACEMAKER GENERATOR CHANGE;  Surgeon: Steven C Klein, MD;  Location: [facility] CATH LAB;  Service: Cardiovascular;  Laterality: N/A;     Social History:   reports that she quit smoking about 14 years ago. She has never used smokeless tobacco. She reports that she does not drink alcohol and does not use drugs.   Family History:    Her family history includes Breast cancer in her sister and sister; Hypertension in her mother. There is no history of Colon cancer or Esophageal cancer.   Allergies No Known Allergies   Home Medications  Prior to Admission medications   Medication Sig Start Date End Date Taking? Authorizing Provider  acetaminophen (TYLENOL) 500 MG tablet Take 500-1,000 mg by mouth every 6 (six) hours as needed for moderate pain.     [provider]  alendronate (FOSAMAX) 70 MG tablet TAKE 1 TABLET (70 MG TOTAL) BY MOUTH ONCE A WEEK. TAKE WITH A FULL GLASS OF WATER ON AN EMPTY STOMACH. 03/14/20   Finnegan, Timothy J, MD  allopurinol (ZYLOPRIM) 100 MG tablet Take 100 mg by mouth daily. 11/28/19   [provider]  amLODipine (NORVASC) 5 MG tablet Take 5 mg by mouth daily.    [provider]  aspirin 81 MG EC tablet Take 81 mg by mouth daily.     [provider]  Blood Pressure  KIT Automated blood pressure measuring device. 06/29/18   Dahal, Binaya, MD  clopidogrel (PLAVIX) 75 MG tablet Take 75 mg by mouth daily with breakfast.    [provider]  hydrochlorothiazide (HYDRODIURIL) 25 MG tablet Take 25 mg by mouth daily. 10/19/19   [provider]  letrozole (FEMARA) 2.5 MG tablet TAKE 1 TABLET BY MOUTH EVERY DAY 09/30/19   Finnegan, Timothy J, MD  LUMIGAN 0.01 % SOLN Place 1 drop into both eyes at bedtime. 01/13/18   [provider]  mirtazapine (REMERON) 7.5 MG tablet Take 7.5 mg by mouth at bedtime.  Patient not taking: Reported on 06/28/2020 09/11/18   [provider]  pantoprazole (PROTONIX) 40 MG tablet Take 40 mg by mouth daily.    [provider]  Potassium 99 MG TABS Take 99 mg by mouth daily.    [provider]  simvastatin (ZOCOR) 40 MG tablet Take 1 tablet (40 mg total) by mouth daily at 6 PM. 06/28/20   Klein, Steven C, MD     Critical care time: NA   Oshen Wlodarczyk E Skyah Hannon ACNP-BC [facility] Pager # [phone] OR # [phone] if no answer      

## 2020-07-25 NOTE — Progress Notes (Signed)
PROGRESS NOTE    [name]  LPF:[id number] DOB: 22-Oct-1933 DOA: 07/24/2020 PCP: Philmore Pali, NP    Brief Narrative:  [name] was admitted to the hospital with working diagnosis of newly diagnosed large hilar/infrahilar pulmonary mass, complicated by hemoptysis.  87 year old female past medical history for breast cancer status post lumpectomy 11/2018, history of CABG, complete heart block, status post pacer, hypertension, chronic anemia who presented to the emergency room with acute onset of severe hemoptysis, copious amounts of bloody sputum, dark blood with blood clots. No associated, dyspnea, fevers, or chills.  Patient stop smoking about 15 years ago, history of severe tobacco abuse, about 2 packs per day for many years. Reports chronic cough.   On her initial physical examination blood pressure 149/79, heart rate 59, respirate 21-24, oxygen saturation 92%, lungs with no rales or rhonchi, heart S1-S2, present, rhythmic, soft abdomen, no lower extremity edema.  Sodium 139, potassium 2.8, chloride 102, bicarb 29, glucose 107, BUN 11, creatinine 0.79, troponin I 17-25, white count 6.0, hemoglobin 10.2, hematocrit 32.9, platelets 370 SARS COVID-19 negative.  Chest radiograph with right lower lobe loss of lung volume.  CT chest with considerable right-sided hilar mass with adenopathy and occlusion of the bronchus intermedius with lower lobe consolidation.  Associated subcarinal adenopathy.  EKG 64 bpm, left axis deviation, prolonged QRS with ventricular pacing, no significant ST segment or T wave changes.  Assessment & Plan:   Principal Problem:   Hemoptysis Active Problems:   Pacemaker-[employer]   Essential hypertension   Malignant neoplasm of upper-outer quadrant of right breast in female, estrogen receptor positive ([place])   Lung mass   1. Acute blood loss anemia due to acute hemoptysis in the setting of right hilar mass, with adenopathy and occulusion of the bronchus  intermedius.  Patient with loss of lung volume at the right lower lobe, her oxygenation is 98% on 1 L/min of supplemental 02 per [place].  Hgb is 8,5 from admission 10.2.   Plan to continue holding clopidogrel and consult Pulmonary for diagnostic bronchoscopy. Patient with a high pre-test probability for bronchogenic carcinoma.   Continue antitussive agents and if hemoptysis reoccur will quantify bleeding.    Follow on H&H in am, keep Hgb above 7 and check on iron stores.   2. Hypokalemia and hypomagnesemia. Stable renal function with serum cr at 0,67. K is 4,3 and serum bicarbonate at 26. Follow up on renal function in am. Patient is tolerating po well, discontinue IV fluids.  Check Mg in am  3. HTN. Continue blood pressure control with amlodipine   4. Hx of CVA. Continue to hold on clopidogrel. Blood pressure control with amlodipine.   5. Heart block sp pace. Paved rhythm, continue telemetry monitoring for now.   6. Hx of breast cancer and anemia of chronic disease. Continue with tamoxifen.  Follow on iron stores.   Patient continue to be at high risk for worsening anemia.   Status is: Inpatient  Remains inpatient appropriate because:Inpatient level of care appropriate due to severity of illness   Dispo: The patient is from: Home              Anticipated d/c is to: Home              Patient currently is not medically stable to d/c.   Difficult to place patient No   DVT prophylaxis: scd   Code Status:   full  Family Communication:  I spoke with patient's daughter at the bedside,  we talked in detail about patient's condition, plan of care and prognosis and all questions were addressed.      Consultants:   Pulmonary     Subjective: Patient is feeling better, no nausea or vomiting, no hemoptysis this am, no chest pain or dyspnea.   Objective: Vitals:   07/25/20 0751 07/25/20 0930 07/25/20 0945 07/25/20 1111  BP: (!) 142/77 118/67 122/73 126/65  Pulse: 69 65 65 66   Resp:  15 20 16   Temp: 98.2 F (36.8 C)   98.1 F (36.7 C)  TempSrc: Oral   Oral  SpO2: 99% 100% 97% 98%  Weight:      Height:        Intake/Output Summary (Last 24 hours) at 07/25/2020 1213 Last data filed at 07/25/2020 0749 Gross per 24 hour  Intake 50 ml  Output --  Net 50 ml   Filed Weights   07/24/20 2231  Weight: 75 kg    Examination:   General: deconditioned and ill looking appearing  Neurology: Awake and alert, non focal  E ENT: mild pallor, no icterus, oral mucosa moist Cardiovascular: No JVD. S1-S2 present, rhythmic, no gallops, rubs, or murmurs. No lower extremity edema. Pulmonary: positive breath sounds bilaterally, with no wheezing, rhonchi or rales. Decreased breaths sounds at the right base.  Gastrointestinal. Abdomen soft and non tender Skin. No rashes Musculoskeletal: no joint deformities     Data Reviewed: I have personally reviewed following labs and imaging studies  CBC: Recent Labs  Lab 07/24/20 2235 07/25/20 0422  WBC 6.0 5.9  NEUTROABS 4.1 4.0  HGB 10.2* 8.5*  HCT 32.9* 28.5*  MCV 86.4 88.8  PLT 370 157   Basic Metabolic Panel: Recent Labs  Lab 07/24/20 2235 07/25/20 0105 07/25/20 0422  NA 139  --  140  K 2.8*  --  4.3  CL 102  --  109  CO2 29  --  26  GLUCOSE 107*  --  91  BUN 11  --  10  CREATININE 0.79  --  0.67  CALCIUM 8.9  --  8.2*  MG  --  1.3*  --    GFR: Estimated Creatinine Clearance: 54.2 mL/min (by C-G formula based on SCr of 0.67 mg/dL). Liver Function Tests: Recent Labs  Lab 07/25/20 0422  AST 15  ALT 8  ALKPHOS 40  BILITOT 1.1  PROT 6.1*  ALBUMIN 2.0*   No results for input(s): LIPASE, AMYLASE in the last 168 hours. No results for input(s): AMMONIA in the last 168 hours. Coagulation Profile: Recent Labs  Lab 07/24/20 2235  INR 1.1   Cardiac Enzymes: No results for input(s): CKTOTAL, CKMB, CKMBINDEX, TROPONINI in the last 168 hours. BNP (last 3 results) No results for input(s): PROBNP in the  last 8760 hours. HbA1C: No results for input(s): HGBA1C in the last 72 hours. CBG: No results for input(s): GLUCAP in the last 168 hours. Lipid Profile: No results for input(s): CHOL, HDL, LDLCALC, TRIG, CHOLHDL, LDLDIRECT in the last 72 hours. Thyroid Function Tests: No results for input(s): TSH, T4TOTAL, FREET4, T3FREE, THYROIDAB in the last 72 hours. Anemia Panel: No results for input(s): VITAMINB12, FOLATE, FERRITIN, TIBC, IRON, RETICCTPCT in the last 72 hours.    Radiology Studies: I have reviewed all of the imaging during this hospital visit personally     Scheduled Meds: . allopurinol  100 mg Oral Daily  . amLODipine  5 mg Oral Daily  . latanoprost  1 drop Both Eyes QHS  . letrozole  2.5 mg Oral Daily  . pantoprazole  40 mg Oral Daily  . simvastatin  40 mg Oral q1800   Continuous Infusions:   LOS: 0 days        Kathlyne Loud Gerome Apley, MD

## 2020-07-26 MED ORDER — MAGNESIUM SULFATE IN D5W 1-5 GM/100ML-% IV SOLN
1.0000 g | Freq: Once | INTRAVENOUS | Status: AC
  Administered 2020-07-26: 1 g via INTRAVENOUS
  Filled 2020-07-26: qty 100

## 2020-07-26 NOTE — Progress Notes (Signed)
SATURATION QUALIFICATIONS: (This note is used to comply with regulatory documentation for home oxygen)  Patient Saturations on Room Air at Rest = 97%  Patient Saturations on Room Air while Ambulating = 88%  Patient Saturations on 2 Liters of oxygen while Ambulating = 93%  Please briefly explain why patient needs home oxygen: Pt oxygen saturation dropped to 88% while ambulating  Domingue Coltrain, RN

## 2020-07-26 NOTE — TOC Transition Note (Signed)
Transition of Care [facility]) - CM/SW Discharge Note   Patient Details  Name: [name] MRN: [id number] Date of Birth: [birth year]  [facility] CM/SW Contact:  Bartholomew Crews, RN Phone Number: [phone] 07/26/2020, 12:21 PM   Clinical Narrative:     Notified by MD of patient transitioning home today, but will need covid test tomorrow in preparation for outpatient bronchoscopy. Spoke with [facility] - verified patient is scheduled for bronchoscopy on Friday at 12 - patient may get covid test either tomorrow or Thursday morning at [address] [place], which is a drive thru testing area and does not require an appointment. Information provided on AVS and patient advised at the bedside. Patient stated that her family will make sure she gets her test.   PTA home alone. Uses a walker. No home oxygen. DME oxygen order provided. Pulm note written and supportive of need for home oxygen. Discussed DME providers. Referral to [facility] for home oxygen. Tank to be delivered to the room and concentrator to be delivered to the home.   PCP and preferred pharmacy verified in [place].   Family to provide transportation home.   No further TOC needs identified.     Final next level of care: Home/Self Care Barriers to Discharge: No Barriers Identified   Patient Goals and CMS Choice Patient states their goals for this hospitalization and ongoing recovery are:: return to her home CMS Medicare.gov Compare Post Acute Care list provided to:: Patient Choice offered to / list presented to : Patient  Discharge Placement                       Discharge Plan and Services In-house Referral: NA Discharge Planning Services: CM Consult Post Acute Care Choice: Durable Medical Equipment          DME Arranged: Oxygen DME Agency: [facility] Date DME Agency Contacted: 07/26/20 Time DME Agency Contacted: 1220 Representative spoke with at DME Agency: [place]:  NA [place] Agency: NA        Social Determinants of Health ([place]) Interventions     Readmission Risk Interventions No flowsheet data found.

## 2020-07-26 NOTE — Plan of Care (Signed)

## 2020-07-26 NOTE — Progress Notes (Signed)
[facility] paged 7 beats V Tach asymptomatic vital signs 87.8 bp 138/67 HR 66 95% 2 liter will continue to monitor and inform oncoming nurse. Arthor Captain LPN

## 2020-07-26 NOTE — Discharge Summary (Signed)
Physician Discharge Summary  [name] WUJ:[id number] DOB: [birth year] DOA: 07/24/2020  PCP: Philmore Pali, NP  Admit date: 07/24/2020 Discharge date: 07/26/2020  Admitted From: Home  Disposition:  Home   Recommendations for Outpatient Follow-up and new medication changes:  1. Follow up with follow up with Dr. Concepcion Living for outpatient bronchoscopy on 07/29/20.  2. Continue holding on aspirin and clopidogrel until further instructions.  Patient will get COVID test tomorrow, drive trough at  at [address]  In preparation for procedure on Friday.   I spoke over the phone with the patient's granddaughter about patient's  condition, plan of care, prognosis and all questions were addressed.  Home Health: no   Equipment/Devices: Home 02    Discharge Condition: stable  CODE STATUS: full  Diet recommendation: heart healthy   Brief/Interim Summary: [name] was admitted to the hospital with a working diagnosis of newly diagnosed large hilar/infrahilar pulmonary mass, complicated by hemoptysis.  87 year old female past medical history for breast cancer status post lumpectomy 11/2018, history of CABG, complete heart block, status post pacer, hypertension, chronic anemia who presented to the emergency room with acute onset of severe hemoptysis, copious amounts of bloody sputum, dark blood with blood clots. No associated, dyspnea, fevers, or chills.  Patient stop smoking about 15 years ago, history of severe tobacco abuse, about 2 packs per day for many years. Reports chronic cough.   On her initial physical examination blood pressure 149/79, heart rate 59, respiratory rate 21-24, oxygen saturation 92%, lungs with no rales or rhonchi, heart S1-S2, present, rhythmic, soft abdomen, no lower extremity edema.  Sodium 139, potassium 2.8, chloride 102, bicarb 29, glucose 107, BUN 11, creatinine 0.79, troponin I 17-25, white count 6.0, hemoglobin 10.2, hematocrit 32.9,  platelets 370 SARS COVID-19 negative.  Chest radiograph with right lower lobe loss of lung volume.  CT chest with considerable right-sided hilar mass with adenopathy and occlusion of the bronchus intermedius with lower lobe consolidation.  Associated subcarinal adenopathy.  EKG 64 bpm, left axis deviation, prolonged QRS with ventricular pacing, no significant ST segment or T wave changes.  1. Acute blood loss anemia due to acute hemoptysis in the setting of right hilar mass, with adenopathy and occlusion of the bronchus intermedius. Patient was placed on supplemental oxygen per nasal cannula, had close follow-up on hemoglobin and hematocrit, antiplatelet therapy were discontinued. She was placed on antitussive agents as needed.  Her hemoglobin remained stable, 10.2-8.5, hematocrit 32.9-28.5.  No further hemoptysis during her hospitalization.  Patient was consulted by pulmonary, high pretest for ability for bronchogenic carcinoma, patient scheduled for diagnostic bronchoscopy with endobronchial ultrasound on April 1. Recommendations to hold clopidogrel for 5 days prior to procedure, last dose was on 03/27 AM.  Ambulatory oxymetry on ambulation on room air down to 88%, home 02 will be arranged.   2.  Hypokalemia/hypomagnesemia.  Patient received intravenous fluids, electrolytes were corrected.  She received potassium chloride and magnesium sulfate.  Follow-up potassium 4.3, sodium 140, chloride 109, bicarb 26, glucose 91, BUN 10, serum creatinine 0.67. Resume potassium supplementation at discharge.   3.  Hypertension.  Continue amlodipine for blood pressure control. Resume HCTZ at discharge.   4.  History of CVA/ dyslidemia.  Hold clopidogrel in preparation for endoscopic procedure.  Continue blood pressure control amlodipine.  Continue with statin therapy.   5.  History of heart block.  Patient status post pacer, paced rhythm on telemetry.  6.  History of breast cancer, anemia of  chronic disease.  Continue tamoxifen. Follow-up iron studies as an outpatient.   Discharge Diagnoses:  Principal Problem:   Hemoptysis Active Problems:   Pacemaker-Medtronic   Essential hypertension   Malignant neoplasm of upper-outer quadrant of right breast in female, estrogen receptor positive ([place])   Lung mass    Discharge Instructions   Allergies as of 07/26/2020   No Known Allergies     Medication List    STOP taking these medications   aspirin 81 MG EC tablet   clopidogrel 75 MG tablet Commonly known as: PLAVIX   mirtazapine 7.5 MG tablet Commonly known as: REMERON     TAKE these medications   acetaminophen 500 MG tablet Commonly known as: TYLENOL Take 500-1,000 mg by mouth every 6 (six) hours as needed for moderate pain.   alendronate 70 MG tablet Commonly known as: FOSAMAX TAKE 1 TABLET (70 MG TOTAL) BY MOUTH ONCE A WEEK. TAKE WITH A FULL GLASS OF WATER ON AN EMPTY STOMACH.   allopurinol 100 MG tablet Commonly known as: ZYLOPRIM Take 100 mg by mouth daily.   amLODipine 5 MG tablet Commonly known as: NORVASC Take 5 mg by mouth daily.   Blood Pressure Kit Automated blood pressure measuring device.   hydrochlorothiazide 25 MG tablet Commonly known as: HYDRODIURIL Take 25 mg by mouth daily.   letrozole 2.5 MG tablet Commonly known as: FEMARA TAKE 1 TABLET BY MOUTH EVERY DAY   Lumigan 0.01 % Soln Generic drug: bimatoprost Place 1 drop into both eyes at bedtime.   pantoprazole 40 MG tablet Commonly known as: PROTONIX Take 40 mg by mouth daily.   Potassium 99 MG Tabs Take 99 mg by mouth daily.   simvastatin 40 MG tablet Commonly known as: ZOCOR Take 1 tablet (40 mg total) by mouth daily at 6 PM.            Durable Medical Equipment  (From admission, onward)         Start     Ordered   07/26/20 1115  For home use only DME oxygen  Once       Question Answer Comment  Length of Need 6 Months   Mode or (Route) Nasal cannula    Liters per Minute 2   Frequency Continuous (stationary and portable oxygen unit needed)   Oxygen conserving device Yes   Oxygen delivery system Gas      07/26/20 1114          No Known Allergies  Consultations:  Pulmonary    Procedures/Studies: CT Angio Chest PE W and/or Wo Contrast  Result Date: 07/25/2020 CLINICAL DATA:  Hemoptysis and chest pain EXAM: CT ANGIOGRAPHY CHEST WITH CONTRAST TECHNIQUE: Multidetector CT imaging of the chest was performed using the standard protocol during bolus administration of intravenous contrast. Multiplanar CT image reconstructions and MIPs were obtained to evaluate the vascular anatomy. CONTRAST:  10m OMNIPAQUE IOHEXOL 350 MG/ML SOLN COMPARISON:  Chest x-ray from the previous day. FINDINGS: Cardiovascular: Mild atherosclerotic calcifications of the aorta are noted. No aneurysmal dilatation is seen. Pacing device is noted. No cardiac enlargement is seen. The pulmonary artery shows a normal branching pattern on the on the left. No filling defects are identified to suggest pulmonary embolism. On the right there is significant mass effect upon the pulmonary arterial branches by a large central hilar mass. No definitive filling defect is identified. Significant narrowing is noted within the left innominate vein likely related to the prior pacemaker placement. Multiple chest wall and neck collaterals  are seen. Mediastinum/Nodes: Thoracic inlet is within normal limits. The esophagus is within normal limits. Considerable right-sided hilar mass is noted with adenopathy and occlusion of the bronchus intermedius with lower lobe consolidation. Associated subcarinal adenopathy is noted as well. Small hilar nodes are noted on left but not significant by size criteria. Lungs/Pleura: Left lung is well aerated without focal infiltrate or sizable effusion. Right lung demonstrates consolidation in the medial aspect of the lower lobe secondary to the central mass lesion. The  right hilar component seen on image number 181 of series 7 measures 4.5 x 3.4 cm in greatest dimension. Additionally a larger infrahilar component is noted which measures approximately 5.8 by at least 4.5 cm. Occlusion of the bronchus intermedius is noted in these changes are consistent with pulmonary neoplasm till proven otherwise. Upper Abdomen: Visualized upper abdomen is within normal limits. Musculoskeletal: Degenerative changes of the thoracic spine are noted. Review of the MIP images confirms the above findings. IMPRESSION: No evidence of pulmonary emboli. Large right hilar and infrahilar mass lesion with compression on the central aspect of the right pulmonary artery with evidence of occlusion of the bronchus intermedius and significant lower lobe consolidation. Associated subcarinal adenopathy is noted as well. Bronchoscopic evaluation is recommended Narrowing in the left innominate vein related to prior pacemaker placement. Multiple chest wall and neck collaterals are noted. Aortic Atherosclerosis (ICD10-I70.0). Electronically Signed   By: Inez Catalina M.D.   On: 07/25/2020 00:59   DG Chest Port 1 View  Result Date: 07/24/2020 CLINICAL DATA:  Fall with multiple episodes of bloody phlegm. EXAM: PORTABLE CHEST 1 VIEW COMPARISON:  Chest radiograph July 13, 2018. FINDINGS: Cardiomegaly. Atherosclerotic tortuous thoracic aorta. Right rib deviation of the trachea, slightly increased from prior. Moderate right pleural effusion with right basilar atelectasis. Left lung is clear. No displaced rib fracture visualized. IMPRESSION: 1. Moderate right pleural effusion with right basilar atelectasis. 2. Cardiomegaly. 3. Atherosclerotic tortuous thoracic aorta with rightward deviation of the trachea, which is in part accentuated by technique but is suggestive of ascending thoracic aortic aneurysm. Consider further evaluation with chest CT with contrast. Electronically Signed   By: Dahlia Bailiff MD   On: 07/24/2020  22:48       Subjective: Patient is feeling better, hemoptysis continue to improve, no nausea or vomiting, no chest pain or dyspnea.   Discharge Exam: Vitals:   07/26/20 0640 07/26/20 0858  BP: 138/68 115/72  Pulse: 66 72  Resp: 18 20  Temp: 97.6 F (36.4 C) 98.4 F (36.9 C)  SpO2: 95% 98%   Vitals:   07/25/20 2146 07/26/20 0452 07/26/20 0640 07/26/20 0858  BP: 124/67 119/64 138/68 115/72  Pulse: 60 67 66 72  Resp: 17 17 18 20   Temp: 98.9 F (37.2 C) 98.6 F (37 C) 97.6 F (36.4 C) 98.4 F (36.9 C)  TempSrc: Oral  Oral Oral  SpO2: 92% 90% 95% 98%  Weight: 75 kg     Height:        General: Not in pain or dyspnea.  Neurology: Awake and alert, non focal  E ENT: no pallor, no icterus, oral mucosa moist Cardiovascular: No JVD. S1-S2 present, rhythmic, no gallops, rubs, or murmurs. No lower extremity edema. Pulmonary: positive breath sounds bilaterally, with no wheezing, rhonchi or rales. Gastrointestinal. Abdomen soft and non tender Skin. No rashes Musculoskeletal: no joint deformities   The results of significant diagnostics from this hospitalization (including imaging, microbiology, ancillary and laboratory) are listed below for reference.  Microbiology: Recent Results (from the past 240 hour(s))  Resp Panel by RT-PCR (Flu A&B, Covid) Nasopharyngeal Swab     Status: None   Collection Time: 07/24/20 10:37 PM   Specimen: Nasopharyngeal Swab; Nasopharyngeal(NP) swabs in vial transport medium  Result Value Ref Range Status   SARS Coronavirus 2 by RT PCR NEGATIVE NEGATIVE Final    Comment: (NOTE) SARS-CoV-2 target nucleic acids are NOT DETECTED.  The SARS-CoV-2 RNA is generally detectable in upper respiratory specimens during the acute phase of infection. The lowest concentration of SARS-CoV-2 viral copies this assay can detect is 138 copies/mL. A negative result does not preclude SARS-Cov-2 infection and should not be used as the sole basis for treatment  or other patient management decisions. A negative result may occur with  improper specimen collection/handling, submission of specimen other than nasopharyngeal swab, presence of viral mutation(s) within the areas targeted by this assay, and inadequate number of viral copies(<138 copies/mL). A negative result must be combined with clinical observations, patient history, and epidemiological information. The expected result is Negative.  Fact Sheet for Patients:  EntrepreneurPulse.com.au  Fact Sheet for Healthcare Providers:  IncredibleEmployment.be  This test is no t yet approved or cleared by the [place] FDA and  has been authorized for detection and/or diagnosis of SARS-CoV-2 by FDA under an Emergency Use Authorization (EUA). This EUA will remain  in effect (meaning this test can be used) for the duration of the COVID-19 declaration under Section 564(b)(1) of the Act, 21 U.S.C.section 360bbb-3(b)(1), unless the authorization is terminated  or revoked sooner.       Influenza A by PCR NEGATIVE NEGATIVE Final   Influenza B by PCR NEGATIVE NEGATIVE Final    Comment: (NOTE) The Xpert Xpress SARS-CoV-2/FLU/RSV plus assay is intended as an aid in the diagnosis of influenza from Nasopharyngeal swab specimens and should not be used as a sole basis for treatment. Nasal washings and aspirates are unacceptable for Xpert Xpress SARS-CoV-2/FLU/RSV testing.  Fact Sheet for Patients: EntrepreneurPulse.com.au  Fact Sheet for Healthcare Providers: IncredibleEmployment.be  This test is not yet approved or cleared by the [place] FDA and has been authorized for detection and/or diagnosis of SARS-CoV-2 by FDA under an Emergency Use Authorization (EUA). This EUA will remain in effect (meaning this test can be used) for the duration of the COVID-19 declaration under Section 564(b)(1) of the Act, 21 U.S.C. section  360bbb-3(b)(1), unless the authorization is terminated or revoked.  Performed at [facility], [place] [address], [place], [place] [address]      Labs: BNP (last 3 results) No results for input(s): BNP in the last 8760 hours. Basic Metabolic Panel: Recent Labs  Lab 07/24/20 2235 07/25/20 0105 07/25/20 0422  NA 139  --  140  K 2.8*  --  4.3  CL 102  --  109  CO2 29  --  26  GLUCOSE 107*  --  91  BUN 11  --  10  CREATININE 0.79  --  0.67  CALCIUM 8.9  --  8.2*  MG  --  1.3*  --    Liver Function Tests: Recent Labs  Lab 07/25/20 0422  AST 15  ALT 8  ALKPHOS 40  BILITOT 1.1  PROT 6.1*  ALBUMIN 2.0*   No results for input(s): LIPASE, AMYLASE in the last 168 hours. No results for input(s): AMMONIA in the last 168 hours. CBC: Recent Labs  Lab 07/24/20 2235 07/25/20 0422  WBC 6.0 5.9  NEUTROABS 4.1 4.0  HGB 10.2* 8.5*  HCT 32.9* 28.5*  MCV 86.4 88.8  PLT 370 321   Cardiac Enzymes: No results for input(s): CKTOTAL, CKMB, CKMBINDEX, TROPONINI in the last 168 hours. BNP: Invalid input(s): POCBNP CBG: No results for input(s): GLUCAP in the last 168 hours. D-Dimer Recent Labs    07/24/20 2235  DDIMER 0.80*   Hgb A1c No results for input(s): HGBA1C in the last 72 hours. Lipid Profile No results for input(s): CHOL, HDL, LDLCALC, TRIG, CHOLHDL, LDLDIRECT in the last 72 hours. Thyroid function studies No results for input(s): TSH, T4TOTAL, T3FREE, THYROIDAB in the last 72 hours.  Invalid input(s): FREET3 Anemia work up No results for input(s): VITAMINB12, FOLATE, FERRITIN, TIBC, IRON, RETICCTPCT in the last 72 hours. Urinalysis    Component Value Date/Time   COLORURINE YELLOW 07/13/2018 1420   APPEARANCEUR HAZY (A) 07/13/2018 1420   LABSPEC 1.017 07/13/2018 1420   PHURINE 5.0 07/13/2018 1420   GLUCOSEU NEGATIVE 07/13/2018 1420   HGBUR SMALL (A) 07/13/2018 1420   BILIRUBINUR NEGATIVE 07/13/2018 1420   KETONESUR 5 (A) 07/13/2018 1420   PROTEINUR 30  (A) 07/13/2018 1420   NITRITE NEGATIVE 07/13/2018 1420   LEUKOCYTESUR NEGATIVE 07/13/2018 1420   Sepsis Labs Invalid input(s): PROCALCITONIN,  WBC,  LACTICIDVEN Microbiology Recent Results (from the past 240 hour(s))  Resp Panel by RT-PCR (Flu A&B, Covid) Nasopharyngeal Swab     Status: None   Collection Time: 07/24/20 10:37 PM   Specimen: Nasopharyngeal Swab; Nasopharyngeal(NP) swabs in vial transport medium  Result Value Ref Range Status   SARS Coronavirus 2 by RT PCR NEGATIVE NEGATIVE Final    Comment: (NOTE) SARS-CoV-2 target nucleic acids are NOT DETECTED.  The SARS-CoV-2 RNA is generally detectable in upper respiratory specimens during the acute phase of infection. The lowest concentration of SARS-CoV-2 viral copies this assay can detect is 138 copies/mL. A negative result does not preclude SARS-Cov-2 infection and should not be used as the sole basis for treatment or other patient management decisions. A negative result may occur with  improper specimen collection/handling, submission of specimen other than nasopharyngeal swab, presence of viral mutation(s) within the areas targeted by this assay, and inadequate number of viral copies(<138 copies/mL). A negative result must be combined with clinical observations, patient history, and epidemiological information. The expected result is Negative.  Fact Sheet for Patients:  EntrepreneurPulse.com.au  Fact Sheet for Healthcare Providers:  IncredibleEmployment.be  This test is no t yet approved or cleared by the [place] FDA and  has been authorized for detection and/or diagnosis of SARS-CoV-2 by FDA under an Emergency Use Authorization (EUA). This EUA will remain  in effect (meaning this test can be used) for the duration of the COVID-19 declaration under Section 564(b)(1) of the Act, 21 U.S.C.section 360bbb-3(b)(1), unless the authorization is terminated  or revoked sooner.        Influenza A by PCR NEGATIVE NEGATIVE Final   Influenza B by PCR NEGATIVE NEGATIVE Final    Comment: (NOTE) The Xpert Xpress SARS-CoV-2/FLU/RSV plus assay is intended as an aid in the diagnosis of influenza from Nasopharyngeal swab specimens and should not be used as a sole basis for treatment. Nasal washings and aspirates are unacceptable for Xpert Xpress SARS-CoV-2/FLU/RSV testing.  Fact Sheet for Patients: EntrepreneurPulse.com.au  Fact Sheet for Healthcare Providers: IncredibleEmployment.be  This test is not yet approved or cleared by the [place] FDA and has been authorized for detection and/or diagnosis of SARS-CoV-2 by FDA under an Emergency Use Authorization (EUA). This EUA  will remain in effect (meaning this test can be used) for the duration of the COVID-19 declaration under Section 564(b)(1) of the Act, 21 U.S.C. section 360bbb-3(b)(1), unless the authorization is terminated or revoked.  Performed at [facility], [place] [address], [place], [place] [address]      Time coordinating discharge: 45 minutes  SIGNED:   Tawni Millers, MD  [facility] 07/26/2020, 10:08 AM

## 2020-07-26 NOTE — Progress Notes (Signed)
DISCHARGE NOTE HOME [name] to be discharged home per MD order. Discussed prescriptions and follow up appointments with the patient. Prescriptions given to patient; medication list explained in detail. Patient verbalized understanding.  Skin clean, dry and intact without evidence of skin break down, no evidence of skin tears noted. IV catheter discontinued intact. Site without signs and symptoms of complications. Dressing and pressure applied. Pt denies pain at the site currently. No complaints noted.  Patient free of lines, drains, and wounds.   An After Visit Summary (AVS) was printed and given to the patient. Patient escorted via wheelchair, and discharged home via private auto.  Mahnoor Mathisen S Gaston Dase, RN

## 2020-07-27 ENCOUNTER — Other Ambulatory Visit: Payer: Self-pay

## 2020-07-27 ENCOUNTER — Ambulatory Visit
Admission: RE | Admit: 2020-07-27 | Discharge: 2020-07-27 | Disposition: A | Discharge status: Home / Self Care | Payer: Medicare HMO | Source: Ambulatory Visit | Attending: Oncology | Admitting: Oncology

## 2020-07-27 ENCOUNTER — Telehealth: Payer: Self-pay | Admitting: Oncology

## 2020-07-27 ENCOUNTER — Ambulatory Visit: Admission: RE | Admit: 2020-07-27 | Payer: Medicare HMO | Source: Ambulatory Visit

## 2020-07-27 ENCOUNTER — Other Ambulatory Visit
Admission: RE | Admit: 2020-07-27 | Discharge: 2020-07-27 | Disposition: A | Discharge status: Home / Self Care | Payer: Medicare HMO | Source: Ambulatory Visit | Attending: Pulmonary Disease | Admitting: Pulmonary Disease

## 2020-07-27 DIAGNOSIS — R634 Abnormal weight loss: Secondary | ICD-10-CM | POA: Insufficient documentation

## 2020-07-27 DIAGNOSIS — Z20822 Contact with and (suspected) exposure to covid-19: Secondary | ICD-10-CM | POA: Insufficient documentation

## 2020-07-27 DIAGNOSIS — Z01812 Encounter for preprocedural laboratory examination: Secondary | ICD-10-CM | POA: Insufficient documentation

## 2020-07-27 LAB — SARS CORONAVIRUS 2 (TAT 6-24 HRS): SARS Coronavirus 2: NEGATIVE

## 2020-07-27 MED ORDER — IOHEXOL 300 MG/ML  SOLN
100.0000 mL | Freq: Once | INTRAMUSCULAR | Status: AC | PRN
  Administered 2020-07-27: 100 mL via INTRAVENOUS

## 2020-07-27 NOTE — Telephone Encounter (Signed)
Re: New Lung Mass  [name] is an 87 year old female who was last seen by me on 07/11/2020 for her history of stage Ia right breast cancer.  At our visit, she appeared to be doing well except for weight loss.  Her most recent mammogram from the summer 2021 was reported as BI-RADS Category 2.  We discussed her weight loss and she has lost about 60 pounds in 2 years.  She is a former smoker and quit greater than 20 years ago.  She does not meet criteria for the low-dose CT screening program but it was recommended she have imaging with CT CAP.  Patient was agreeable.  She was scheduled for a CT scan on 07/27/2020.  She presented on 07/24/2020 to the emergency room for hemoptysis and shortness of breath.  Work-up included labs and imaging which showed electrolyte derangement and a right-sided hilar mass with adenopathy and occlusion of the bronchus intermedius with lower lobe consolidation worrisome for bronchogenic carcinoma.  She was scheduled for bronchoscopy on 07/29/2020 with Dr. Ander Slade.  Discussed case and imaging with Dr. Grayland Ormond who recommends bronchoscopy and possible thoracentesis (if short of breath) to obtain a diagnosis.  She will need to follow-up with Dr. Grayland Ormond in the next 1 to 2 weeks post bronchoscopy.  I will get this scheduled for her.  Faythe Casa, NP 07/27/2020 1:08 PM

## 2020-07-28 ENCOUNTER — Encounter (HOSPITAL_COMMUNITY): Payer: Self-pay | Admitting: Pulmonary Disease

## 2020-07-28 ENCOUNTER — Other Ambulatory Visit: Payer: Self-pay

## 2020-07-28 ENCOUNTER — Encounter: Payer: Self-pay | Admitting: Internal Medicine

## 2020-07-28 NOTE — Progress Notes (Signed)
PERIOPERATIVE PRESCRIPTION FOR IMPLANTED CARDIAC DEVICE PROGRAMMING  Patient Information: Name:  [name]  DOB:  11/02/1933  MRN:  [id number]    Danna Hefty, RN  P Cv Div Heartcare Device; Clayborn Bigness, RN Planned Procedure: Video Bronchoscopy  Surgeon: Dr. Genia Plants  Date of Procedure: 07/29/20  Cautery will be used.  Position during surgery: Semifowlers   Please send documentation back to:  [facility] (Fax # [phone])   Hope Budds, RN  07/28/2020 7:39 AM   Device Information:  Clinic EP Physician:  Virl Axe, MD   Device Type:  Pacemaker Manufacturer and Phone #:  Medtronic: [phone] Pacemaker Dependent?:  Yes.   Date of Last Device Check:  06/28/20 Normal Device Function?:  Yes.    Electrophysiologist's Recommendations:   Have magnet available.  Provide continuous ECG monitoring when magnet is used or reprogramming is to be performed.   Procedure will likely interfere with device function.  Device should be programmed:  Asynchronous pacing during procedure and returned to normal programming after procedure  Per Device Clinic Standing Orders, York Ram, RN  5:21 PM 07/28/2020

## 2020-07-28 NOTE — Anesthesia Preprocedure Evaluation (Addendum)
Anesthesia Evaluation  Patient identified by MRN, date of birth, ID band Patient awake    Reviewed: Allergy & Precautions, H&P , NPO status , Patient's Chart, lab work & pertinent test results, reviewed documented beta blocker date and time   Airway Mallampati: I  TM Distance: >3 FB Neck ROM: full    Dental no notable dental hx. (+) Edentulous Upper, Edentulous Lower, Upper Dentures, Lower Dentures   Pulmonary neg pulmonary ROS, former smoker,    Pulmonary exam normal breath sounds clear to auscultation       Cardiovascular Exercise Tolerance: Good hypertension, Pt. on medications Normal cardiovascular exam+ dysrhythmias Atrial Fibrillation + pacemaker  Rhythm:regular Rate:Normal  EKG: EKG 07/24/20: 64 bpm, left axis deviation, prolonged QRS with ventricular pacing, no significant ST segment or T wave changes. - V-pacing spikes noted.  EKG 06/28/20 ([facility]): Atrial-sensed ventricular paced rhythm  Echo 07/14/18: IMPRESSIONS  1. The left ventricle has normal systolic function with an ejection  fraction of 60-65%. The cavity size was normal. Left ventricular diastolic  Doppler parameters are indeterminate. No evidence of left ventricular  regional wall motion abnormalities.  2. The right ventricle has normal systolic function. The cavity was  normal. There is no increase in right ventricular wall thickness. Right  ventricular systolic pressure is moderately elevated with an estimated  pressure of 48.4 mmHg.  3. Mild thickening of the mitral valve leaflet.  4. The aortic valve is tricuspid Mild calcification of the aortic valve.    Neuro/Psych Carotid US 05/27/18: Summary:  Right Carotid: Velocities in the right ICA are consistent with a 1-39%  stenosis.  Left Carotid: Velocities in the left ICA are consistent with a 1-39%  stenosis.  Vertebrals: Bilateral vertebral arteries demonstrate antegrade flow.   Neuromuscular disease CVA, Residual Symptoms negative psych ROS   GI/Hepatic Neg liver ROS, GERD  ,  Endo/Other  negative endocrine ROS  Renal/GU Renal disease  negative genitourinary   Musculoskeletal   Abdominal   Peds  Hematology  (+) Blood dyscrasia, anemia ,   Anesthesia Other Findings   Reproductive/Obstetrics negative OB ROS                           Anesthesia Physical Anesthesia Plan  ASA: III  Anesthesia Plan: General   Post-op Pain Management:    Induction: Intravenous  PONV Risk Score and Plan: 3 and Treatment may vary due to age or medical condition and Ondansetron  Airway Management Planned: Oral ETT  Additional Equipment: None  Intra-op Plan:   Post-operative Plan: Extubation in OR  Informed Consent: I have reviewed the patients History and Physical, chart, labs and discussed the procedure including the risks, benefits and alternatives for the proposed anesthesia with the patient or authorized representative who has indicated his/her understanding and acceptance.     Dental Advisory Given  Plan Discussed with: CRNA and Anesthesiologist  Anesthesia Plan Comments: (See PAT note written 07/28/2020 by Myra Gianotti, PA-C. Has Medtronic PPM. Repeat CBC and Hold Clot ordered entered for day of surgery.   DISCUSSION: Patient is an 87 year old female scheduled for the above procedure. Stage 1 right breast cancer in 2020. At oncology follow-up reported weight loss (progressive over past year) and CT scan chest/abd/pelvis scheduled 07/27/20, but on 07/24/20 presented to ED with hemoptysis and SOB. Imaging which right-sided hilar mass with adenopathy and occlusion of the bronchus intermedius with lower lobe consolidation worrisome for bronchogenic carcinoma. Required supplemental O2 (home O2  arranged). Pulmonology consulted. Discharged 07/27/19 with plans for bronchoscopy 07/29/20 in hopes to obtain a tissue diagnosis. HGB 10.2->8.5. ASA and  Plavix held, at least until after procedure.   Other history includes former smoker (quit 07/25/06), HTN, HLD, complete heart block (s/p Medtronic PPM 09/21/04; generator change 07/30/13), CVA (05/2002, left hemiparesis; mild left sided weakness 04/2018, probably secondary to recrudescence of old CVA in setting of rhabdomyolysis AKI after mechanical fall found down for 24 hours), breast cancer (s/p right partial mastectomy 12/03/18), C. Difficile enteritis (06/2018)  She had labs on 3/38/22, but with drop in H/H, will enter repeat STAT CBC order and Hold Clot. 07/27/20 presurgical COVID-19 test negative. Anesthesia team to evaluate on the day of surgery. Medtronic PPM perioperative prescription form is still pending. )       Anesthesia Quick Evaluation

## 2020-07-28 NOTE — Progress Notes (Addendum)
[name] denies chest pain or shortness of breath. Patient was tested for Covid and has been in quarantine since that time.  [name] states that she feels fine , she is wearing oxygen all the time.  [name] has a pacemaker, I sent information to Peri op device and an email to Medtronic representative.

## 2020-07-28 NOTE — Progress Notes (Signed)
[place]  Telephone:([phone]) [phone] Fax:(336) [phone]  ID: Kenn File OB: 1933-08-29  MR#: [id number]  OQH#:[id number]  Patient Care Team: Philmore Pali, NP as PCP - General (Nurse Practitioner) [name], MD as PCP - Cardiology (Cardiology) Theodore Demark, RN as Registered Nurse [name], MD as Consulting Physician (Cardiology)  CHIEF COMPLAINT: Pathologic stage Ia ER/PR positive, 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  INTERVAL HISTORY: Patient returns to clinic today as an add-on after being admitted to the hospital with hemoptysis and found to have the above-stated malignancy.  She currently feels well and is back to her baseline.  She has no further hemoptysis.  She has no neurologic complaints.  She denies any recent fevers.  She admits to weight loss in rehab several months ago, but states this has stabilized.  She denies any chest pain, shortness of breath, or cough.  She denies any nausea, vomiting, constipation, or diarrhea.  She has no urinary complaints.  Patient offers no further specific complaints today.  REVIEW OF SYSTEMS:   Review of Systems  Constitutional: Negative.  Negative for fever, malaise/fatigue and weight loss.  Respiratory: Negative.  Negative for cough, hemoptysis and shortness of breath.   Cardiovascular: Negative.  Negative for chest pain and leg swelling.  Gastrointestinal: Negative.  Negative for abdominal pain.  Genitourinary: Negative.  Negative for dysuria.  Musculoskeletal: Negative.  Negative for back pain.  Skin: Negative.  Negative for rash.  Neurological: Negative.  Negative for dizziness, focal weakness, weakness and headaches.  Psychiatric/Behavioral: Negative.  The patient is not nervous/anxious.     As per HPI. Otherwise, a complete review of systems is negative.  PAST MEDICAL HISTORY: Past  Medical History:  Diagnosis Date  . Breast cancer, right ([place]) 11/2018   Mastectomy  . Complete heart block ([place])   . Gait abnormality 07/23/2016  . GERD (gastroesophageal reflux disease)   . Hemoptysis 07/25/2020  . History of hypertension   . History of stroke   . Hyperhomocysteinemia ([place])   . Hyperlipidemia   . Hypertension   . Hypokalemia 07/13/2018  . Low blood magnesium 07/13/2018  . Pacemaker-Medtronic 08/07/2011  . Stroke [facility])    left sided weakness, 07/28/20- no residual   . Vomiting 07/13/2018    PAST SURGICAL HISTORY: Past Surgical History:  Procedure Laterality Date  . BIOPSY  07/15/2018   Procedure: BIOPSY;  Surgeon: [name], MD;  Location: [facility];  Service: Gastroenterology;;  . BREAST BIOPSY Right 10/27/2018   affirm bx of mass, x clip,  INVASIVE MAMMARY CARCINOMA  . BREAST LUMPECTOMY Right 12/03/2018  . BRONCHIAL BIOPSY  07/29/2020   Procedure: BRONCHIAL BIOPSIES;  Surgeon: Laurin Coder, MD;  Location: [facility];  Service: Pulmonary;;  . BRONCHIAL NEEDLE ASPIRATION BIOPSY  07/29/2020   Procedure: BRONCHIAL NEEDLE ASPIRATION BIOPSIES;  Surgeon: Laurin Coder, MD;  Location: [place] [facility];  Service: Pulmonary;;  . CARDIAC CATHETERIZATION    . ENDOBRONCHIAL ULTRASOUND N/A 07/29/2020   Procedure: ENDOBRONCHIAL ULTRASOUND;  Surgeon: Laurin Coder, MD;  Location: [facility];  Service: Pulmonary;  Laterality: N/A;  . ESOPHAGOGASTRODUODENOSCOPY Left 07/15/2018   Procedure: ESOPHAGOGASTRODUODENOSCOPY (EGD);  Surgeon: [name], MD;  Location: [facility];  Service: Gastroenterology;  Laterality: Left;  . HEMOSTASIS CONTROL  07/29/2020   Procedure: HEMOSTASIS CONTROL;  Surgeon: Laurin Coder, MD;  Location: [facility];  Service: Pulmonary;;  . PACEMAKER INSERTION     Medtronic Enpulse dual-chamber pacemaker  . PARTIAL MASTECTOMY WITH NEEDLE LOCALIZATION AND AXILLARY SENTINEL LYMPH NODE BX Right 12/03/2018   Procedure: PARTIAL MASTECTOMY  WITH NEEDLE LOCALIZATION AND AXILLARY SENTINEL LYMPH NODE BX, RIGHT;  Surgeon: [name], MD;  Location: [facility]S;  Service: General;  Laterality: Right;  . PERMANENT PACEMAKER GENERATOR CHANGE N/A 07/30/2013   Procedure: PERMANENT PACEMAKER GENERATOR CHANGE;  Surgeon: [name], MD;  Location: [facility] CATH LAB;  Service: Cardiovascular;  Laterality: N/A;  . VIDEO BRONCHOSCOPY N/A 07/29/2020   Procedure: VIDEO BRONCHOSCOPY WITH FLUORO;  Surgeon: Laurin Coder, MD;  Location: [facility];  Service: Pulmonary;  Laterality: N/A;    FAMILY HISTORY: Family History  Problem Relation Age of Onset  . Hypertension Mother   . Breast cancer Sister   . Breast cancer Sister   . Colon cancer Neg Hx   . Esophageal cancer Neg Hx     ADVANCED DIRECTIVES (Y/N):  N  HEALTH MAINTENANCE: Social History   Tobacco Use  . Smoking status: Former Smoker    Quit date: 07/25/2006    Years since quitting: 14.0  . Smokeless tobacco: Never Used  Vaping Use  . Vaping Use: Never used  Substance Use Topics  . Alcohol use: No  . Drug use: No     Colonoscopy:  PAP:  Bone density:  Lipid panel:  No Known Allergies  Current Outpatient Medications  Medication Sig Dispense Refill  . acetaminophen (TYLENOL) 500 MG tablet Take 500-1,000 mg by mouth every 6 (six) hours as needed for moderate pain.     [name] alendronate (FOSAMAX) 70 MG tablet TAKE 1 TABLET (70 MG TOTAL) BY MOUTH ONCE A WEEK. TAKE WITH A FULL GLASS OF WATER ON AN EMPTY STOMACH. 12 tablet 2  . allopurinol (ZYLOPRIM) 100 MG tablet Take 100 mg by mouth daily.    [name] amLODipine (NORVASC) 5 MG tablet Take 5 mg by mouth daily.    [name] aspirin EC 81 MG tablet Take 81 mg by mouth daily. Swallow whole.    . Blood Pressure KIT Automated blood pressure measuring device. 1 each 0  . clopidogrel (PLAVIX) 75 MG tablet Take 75 mg by mouth daily.    . hydrochlorothiazide (HYDRODIURIL) 25 MG tablet Take 25 mg by mouth daily.    [name] letrozole (FEMARA) 2.5 MG  tablet TAKE 1 TABLET BY MOUTH EVERY DAY (Patient taking differently: Take 2.5 mg by mouth daily.) 90 tablet 3  . LUMIGAN 0.01 % SOLN Place 1 drop into both eyes at bedtime.  4  . pantoprazole (PROTONIX) 40 MG tablet Take 40 mg by mouth daily.    . Potassium 99 MG TABS Take 99 mg by mouth daily.    . simvastatin (ZOCOR) 40 MG tablet Take 1 tablet (40 mg total) by mouth daily at 6 PM. 90 tablet 3   No current facility-administered medications for this visit.    OBJECTIVE: Vitals:   08/04/20 1542  BP: 140/73  Pulse: 88  Resp: 20  Temp: 99.3 F (37.4 C)  SpO2: 95%     Body mass index is 29.36 kg/m.    ECOG FS:0 - Asymptomatic  General: Well-developed, well-nourished, no acute distress.  Sitting in a wheelchair. Eyes: Pink conjunctiva, anicteric sclera. HEENT: Normocephalic, moist mucous membranes. Lungs: No audible wheezing or coughing. Heart: Regular rate and rhythm. Abdomen: Soft, nontender, no obvious distention. Musculoskeletal: No edema, cyanosis, or clubbing. Neuro: Alert, answering all questions appropriately.  Cranial nerves grossly intact. Skin: No rashes or petechiae noted. Psych: Normal affect.   LAB RESULTS:  Lab Results  Component Value Date   NA 140 07/25/2020   K 4.3 07/25/2020   CL 109 07/25/2020   CO2 26 07/25/2020   GLUCOSE 91 07/25/2020   BUN 10 07/25/2020   CREATININE 0.67 07/25/2020   CALCIUM 8.2 (L) 07/25/2020   PROT 6.1 (L) 07/25/2020   ALBUMIN 2.0 (L) 07/25/2020   AST 15 07/25/2020   ALT 8 07/25/2020   ALKPHOS 40 07/25/2020   BILITOT 1.1 07/25/2020   GFRNONAA >60 07/25/2020   GFRAA >60 07/24/2018    Lab Results  Component Value Date   WBC 5.6 07/29/2020   NEUTROABS 4.0 07/25/2020   HGB 9.2 (L) 07/29/2020   HCT 30.3 (L) 07/29/2020   MCV 89.1 07/29/2020   PLT 347 07/29/2020     STUDIES: CT Angio Chest PE W and/or Wo Contrast  Result Date: 07/25/2020 CLINICAL DATA:  Hemoptysis and chest pain EXAM: CT ANGIOGRAPHY CHEST WITH CONTRAST  TECHNIQUE: Multidetector CT imaging of the chest was performed using the standard protocol during bolus administration of intravenous contrast. Multiplanar CT image reconstructions and MIPs were obtained to evaluate the vascular anatomy. CONTRAST:  39m OMNIPAQUE IOHEXOL 350 MG/ML SOLN COMPARISON:  Chest x-ray from the previous day. FINDINGS: Cardiovascular: Mild atherosclerotic calcifications of the aorta are noted. No aneurysmal dilatation is seen. Pacing device is noted. No cardiac enlargement is seen. The pulmonary artery shows a normal branching pattern on the on the left. No filling defects are identified to suggest pulmonary embolism. On the right there is significant mass effect upon the pulmonary arterial branches by a large central hilar mass. No definitive filling defect is identified. Significant narrowing is noted within the left innominate vein likely related to the prior pacemaker placement. Multiple chest wall and neck collaterals are seen. Mediastinum/Nodes: Thoracic inlet is within normal limits. The esophagus is within normal limits. Considerable right-sided hilar mass is noted with adenopathy and occlusion of the bronchus intermedius with lower lobe consolidation. Associated subcarinal adenopathy is noted as well. Small hilar nodes are noted on left but not significant by size criteria. Lungs/Pleura: Left lung is well aerated without focal infiltrate or sizable effusion. Right lung demonstrates consolidation in the medial aspect of the lower lobe secondary to the central mass lesion. The right hilar component seen on image number 181 of series 7 measures 4.5 x 3.4 cm in greatest dimension. Additionally a larger infrahilar component is noted which measures approximately 5.8 by at least 4.5 cm. Occlusion of the bronchus intermedius is noted in these changes are consistent with pulmonary neoplasm till proven otherwise. Upper Abdomen: Visualized upper abdomen is within normal limits. Musculoskeletal:  Degenerative changes of the thoracic spine are noted. Review of the MIP images confirms the above findings. IMPRESSION: No evidence of pulmonary emboli. Large right hilar and infrahilar mass lesion with compression on the central aspect of the right pulmonary artery with evidence of occlusion of the bronchus intermedius and significant lower lobe consolidation. Associated subcarinal adenopathy is noted as well. Bronchoscopic evaluation is recommended Narrowing in the left innominate vein related to prior pacemaker placement. Multiple chest wall and neck collaterals are noted. Aortic Atherosclerosis (ICD10-I70.0). Electronically Signed   By: MInez CatalinaM.D.   On: 07/25/2020 00:59   CT ABDOMEN PELVIS W CONTRAST  Result Date: 07/28/2020 CLINICAL DATA:  Weight loss, unintentional. History right breast cancer in 2020. EXAM: CT ABDOMEN AND PELVIS WITH CONTRAST TECHNIQUE:  Multidetector CT imaging of the abdomen and pelvis was performed using the standard protocol following bolus administration of intravenous contrast. CONTRAST:  110m OMNIPAQUE IOHEXOL 300 MG/ML  SOLN COMPARISON:  12/19/2018 FINDINGS: Lower chest: Right lower lobe consolidative opacity with right infrahilar mass, incompletely visualized and better characterized on CTA Chest performed 07/25/2020. Hepatobiliary: No suspicious focal abnormality within the liver parenchyma. Calcified gallstones evident. No intrahepatic or extrahepatic biliary dilation. Pancreas: No focal mass lesion. No dilatation of the main duct. No intraparenchymal cyst. No peripancreatic edema. Spleen: No splenomegaly. No focal mass lesion. Adrenals/Urinary Tract: No adrenal nodule or mass. 2.2 cm exophytic lesion lower pole right kidney shows heterogeneous enhancement. This is not substantially changed since 12/19/2018 in that previous study was performed with and without contrast demonstrating macroscopic fat attenuation on the precontrast imaging. 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 Delayed imaging does show small areas of fat density in the lesion, consistent with AML. Bilateral renal cysts are again noted including a dominant central sinus cyst in the interpolar left kidney. Tiny nonobstructing stones are evident bilaterally. No evidence for hydroureter. The urinary bladder appears normal for the degree of distention. Stomach/Bowel: Tiny hiatal hernia. Stomach decompressed. Duodenum is normally positioned as is the ligament of Treitz. No small bowel wall thickening. No small bowel dilatation. The terminal ileum is normal. The appendix is normal. No gross colonic mass. No colonic wall thickening. Vascular/Lymphatic: There is abdominal aortic atherosclerosis without aneurysm. There is no gastrohepatic or hepatoduodenal ligament lymphadenopathy. No retroperitoneal or mesenteric lymphadenopathy. No pelvic sidewall lymphadenopathy. Reproductive: Calcified fibroids are noted in the uterus. There is no adnexal mass. Other: No intraperitoneal free fluid. Musculoskeletal: No worrisome lytic or sclerotic osseous abnormality. IMPRESSION: 1. No evidence for metastatic disease in the abdomen or pelvis. 2. Right lower lobe consolidative opacity with right infrahilar mass, incompletely visualized and better characterized on CTA Chest performed 07/25/2020. 3. 2.2 cm exophytic lesion lower pole right kidney shows heterogeneous enhancement and was characterized as AML on the study from 1.5 years ago. 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 4. Bilateral renal cysts with nonobstructing bilateral renal stones. 5. Cholelithiasis. 6. Tiny hiatal hernia. 7. Aortic Atherosclerosis (ICD10-I70.0). Electronically Signed   By: EMisty StanleyM.D.   On: 07/28/2020 08:55   DG Chest Port 1 View  Result Date: 07/24/2020 CLINICAL DATA:  Fall with multiple episodes of bloody phlegm. EXAM: PORTABLE CHEST 1 VIEW COMPARISON:  Chest radiograph July 13, 2018.  FINDINGS: Cardiomegaly. Atherosclerotic tortuous thoracic aorta. Right rib deviation of the trachea, slightly increased from prior. Moderate right pleural effusion with right basilar atelectasis. Left lung is clear. No displaced rib fracture visualized. IMPRESSION: 1. Moderate right pleural effusion with right basilar atelectasis. 2. Cardiomegaly. 3. Atherosclerotic tortuous thoracic aorta with rightward deviation of the trachea, which is in part accentuated by technique but is suggestive of ascending thoracic aortic aneurysm. Consider further evaluation with chest CT with contrast. Electronically Signed   By: JDahlia BailiffMD   On: 07/24/2020 22:48    ASSESSMENT: Pathologic stage Ia ER/PR positive, 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  PLAN:    1.  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  CT scan results from July 25, 2020 reviewed independently and reported as above.  Patient subsequently underwent bronchoscopy on July 29, 2020 confirming the diagnosis.  Despite her  advanced age, she wishes to proceed with treatment and will benefit from weekly carboplatinum and Taxol with concurrent XRT followed by maintenance immunotherapy for 1 year.  Prior to initiating treatment we will get a PET scan and CT scan of her head to complete staging work-up.  Will also get port placement.  Return to clinic in 1 to 2 weeks to discuss her imaging results, consultation with radiation oncology, and additional treatment planning.  2.  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  Given her advanced age and small size of malignancy, Oncotype DX or adjuvant chemotherapy was not necessary.  She was also evaluated by radiation oncology who determined that adjuvant XRT was also not  needed.  Continue letrozole for a total of 5 years completing treatment in August 2025.  Although we will likely discontinue treatment while she is receiving chemotherapy for her lung cancer.  Her most recent mammogram on November 13, 2019 was reported as BI-RADS 2, repeat in July 2022.    2.  Osteoporosis: Patient's most recent bone mineral density on January 14, 2020 reported T score of -2.6 which is mildly improved over 1 year prior where her T score was reported at -2.8.  She is tolerating letrozole well so we will continue this medication as prescribed.  Continue Fosamax, calcium, and vitamin D supplementation.  Repeat bone mineral density in September 2022.   3.  Anemia: Patient's hemoglobin is 9.2, monitor.   Patient expressed understanding and was in agreement with this plan. She also understands that She can call clinic at any time with any questions, concerns, or complaints.   Cancer Staging Malignant neoplasm of upper-outer quadrant of right breast in female, estrogen receptor positive ([place]) Staging form: Breast, AJCC 8th Edition - Clinical stage from 11/06/2018: Stage IA (cT1a, cN0, cM0, G1, ER+, PR+, HER2-) - Signed by [name], MD on 11/06/2018 Histologic grading system: 3 grade system  Squamous cell carcinoma of right lung ([place]) Staging form: Lung, AJCC 8th Edition - Pathologic stage from 08/05/2020: Stage IIIA (pT3, pN1, cM0) - Signed by [name], MD on 08/05/2020 Stage prefix: Initial diagnosis   [name], MD   08/05/2020 6:40 AM

## 2020-07-28 NOTE — Progress Notes (Signed)
Anesthesia Chart Review:  Case: [id number] Date/Time: 07/29/20 1200   Procedures:      VIDEO BRONCHOSCOPY WITH FLUORO (N/A )     ENDOBRONCHIAL ULTRASOUND (N/A )   Anesthesia type: General   Pre-op diagnosis: lung mass   Location: [facility] / [facility]   Surgeons: Laurin Coder, MD      DISCUSSION: Patient is an 87 year old female scheduled for the above procedure. Stage 1 right breast cancer in 2020. At oncology follow-up reported weight loss (progressive over past year) and CT scan chest/abd/pelvis scheduled 07/27/20, but on 07/24/20 presented to ED with hemoptysis and SOB. Imaging which right-sided hilar mass with adenopathy and occlusion of the bronchus intermedius with lower lobe consolidation worrisome for bronchogenic carcinoma. Required supplemental O2 (home O2 arranged). Pulmonology consulted. Discharged 07/27/19 with plans for bronchoscopy 07/29/20 in hopes to obtain a tissue diagnosis. HGB 10.2->8.5. ASA and Plavix held, at least until after procedure.   Other history includes former smoker (quit 07/25/06), HTN, HLD, complete heart block (s/p Medtronic PPM 09/21/04; generator change 07/30/13), CVA (05/2002, left hemiparesis; mild left sided weakness 04/2018, probably secondary to recrudescence of old CVA in setting of rhabdomyolysis AKI after mechanical fall found down for 24 hours), breast cancer (s/p right partial mastectomy 12/03/18), C. Difficile enteritis (06/2018)  She had labs on 3/38/22, but with drop in H/H, will enter repeat STAT CBC order and Hold Clot. 07/27/20 presurgical COVID-19 test negative. Anesthesia team to evaluate on the day of surgery. Medtronic PPM perioperative prescription form is still pending.   VS:  BP Readings from Last 3 Encounters:  07/26/20 115/72  07/11/20 (!) 148/70  06/28/20 124/80   Pulse Readings from Last 3 Encounters:  07/26/20 72  07/11/20 60  06/28/20 92     PROVIDERS: Philmore Pali, NP is PCP  Virl Axe, MD is EP cardiologist. Last  visit 06/28/20. Device function normal.  Delight Hoh, MD is HEM-ONC. Last visit 07/11/20 with Faythe Casa, NP. Hollice Espy, MD is urologist. Last visit 01/06/20 for right renal mass, stable by [place]. 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 Given age and comorbidities, decision make to follow with repeat US in 2 years.    LABS: Most recent labs results include: Lab Results  Component Value Date   WBC 5.9 07/25/2020   HGB 8.5 (L) 07/25/2020   HCT 28.5 (L) 07/25/2020   PLT 321 07/25/2020   GLUCOSE 91 07/25/2020   ALT 8 07/25/2020   AST 15 07/25/2020   NA 140 07/25/2020   K 4.3 07/25/2020   CL 109 07/25/2020   CREATININE 0.67 07/25/2020   BUN 10 07/25/2020   CO2 26 07/25/2020   INR 1.1 07/24/2020   HGBA1C 6.3 (H) 05/28/2018     IMAGES: CT abd/pelvis 07/27/20: IMPRESSION: 1. No evidence for metastatic disease in the abdomen or pelvis. 2. Right lower lobe consolidative opacity with right infrahilar mass, incompletely visualized and better characterized on CTA Chest performed 07/25/2020. 3. 2.2 cm exophytic lesion lower pole right kidney shows heterogeneous enhancement and was characterized as AML on the study from 1.5 years ago. 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 4. Bilateral renal cysts with nonobstructing bilateral renal stones. 5. Cholelithiasis. 6. Tiny hiatal hernia. 7. Aortic Atherosclerosis (ICD10-I70.0).  CTA chest 07/25/20: IMPRESSION: - No evidence of pulmonary emboli. - Large right hilar and infrahilar mass lesion with compression on the central aspect of the right pulmonary artery with evidence of occlusion of the bronchus  intermedius and significant lower lobe consolidation. Associated subcarinal adenopathy is noted as well. Bronchoscopic evaluation is recommended - Narrowing in the left innominate vein related to prior pacemaker placement. Multiple chest wall and neck collaterals are noted. - Aortic  Atherosclerosis (ICD10-I70.0).  1V PCXR 07/24/20: IMPRESSION: 1. Moderate right pleural effusion with right basilar atelectasis. 2. Cardiomegaly. 3. Atherosclerotic tortuous thoracic aorta with rightward deviation of the trachea, which is in part accentuated by technique but is suggestive of ascending thoracic aortic aneurysm. Consider further evaluation with chest CT with contrast.   EKG: EKG 07/24/20: 64 bpm, left axis deviation, prolonged QRS with ventricular pacing, no significant ST segment or T wave changes. - V-pacing spikes noted.  EKG 06/28/20 ([facility]): Atrial-sensed ventricular paced rhythm   CV: Echo 07/14/18: IMPRESSIONS  1. The left ventricle has normal systolic function with an ejection  fraction of 60-65%. The cavity size was normal. Left ventricular diastolic  Doppler parameters are indeterminate. No evidence of left ventricular  regional wall motion abnormalities.  2. The right ventricle has normal systolic function. The cavity was  normal. There is no increase in right ventricular wall thickness. Right  ventricular systolic pressure is moderately elevated with an estimated  pressure of 48.4 mmHg.  3. Mild thickening of the mitral valve leaflet.  4. The aortic valve is tricuspid Mild calcification of the aortic valve.  Carotid US 05/27/18: Summary:  Right Carotid: Velocities in the right ICA are consistent with a 1-39%  stenosis.  Left Carotid: Velocities in the left ICA are consistent with a 1-39%  stenosis.  Vertebrals: Bilateral vertebral arteries demonstrate antegrade flow.   Past Medical History:  Diagnosis Date  . Breast cancer, right ([place]) 11/2018   Mastectomy  . Complete heart block ([place])   . Gait abnormality 07/23/2016  . GERD (gastroesophageal reflux disease)   . Hemoptysis 07/25/2020  . History of hypertension   . History of stroke   . Hyperhomocysteinemia ([place])   . Hyperlipidemia   . Hypertension   . Hypokalemia 07/13/2018  .  Low blood magnesium 07/13/2018  . Pacemaker-Medtronic 08/07/2011  . Stroke [facility])    left sided weakness  . Vomiting 07/13/2018    Past Surgical History:  Procedure Laterality Date  . BIOPSY  07/15/2018   Procedure: BIOPSY;  Surgeon: [name], MD;  Location: [facility];  Service: Gastroenterology;;  . BREAST BIOPSY Right 10/27/2018   affirm bx of mass, x clip,  INVASIVE MAMMARY CARCINOMA  . BREAST LUMPECTOMY Right 12/03/2018  . CARDIAC CATHETERIZATION    . ESOPHAGOGASTRODUODENOSCOPY Left 07/15/2018   Procedure: ESOPHAGOGASTRODUODENOSCOPY (EGD);  Surgeon: [name], MD;  Location: [facility];  Service: Gastroenterology;  Laterality: Left;  . PACEMAKER INSERTION     Medtronic Enpulse dual-chamber pacemaker  . PARTIAL MASTECTOMY WITH NEEDLE LOCALIZATION AND AXILLARY SENTINEL LYMPH NODE BX Right 12/03/2018   Procedure: PARTIAL MASTECTOMY WITH NEEDLE LOCALIZATION AND AXILLARY SENTINEL LYMPH NODE BX, RIGHT;  Surgeon: [name], MD;  Location: [facility] ORS;  Service: General;  Laterality: Right;  . PERMANENT PACEMAKER GENERATOR CHANGE N/A 07/30/2013   Procedure: PERMANENT PACEMAKER GENERATOR CHANGE;  Surgeon: [name], MD;  Location: [facility] CATH LAB;  Service: Cardiovascular;  Laterality: N/A;    MEDICATIONS: No current facility-administered medications for this encounter.   [name] acetaminophen (TYLENOL) 500 MG tablet  . allopurinol (ZYLOPRIM) 100 MG tablet  . amLODipine (NORVASC) 5 MG tablet  . hydrochlorothiazide (HYDRODIURIL) 25 MG tablet  . letrozole (FEMARA) 2.5 MG tablet  . LUMIGAN 0.01 %  SOLN  . pantoprazole (PROTONIX) 40 MG tablet  . Potassium 99 MG TABS  . simvastatin (ZOCOR) 40 MG tablet  . alendronate (FOSAMAX) 70 MG tablet  . Blood Pressure KIT     Myra Gianotti, PA-C Surgical Short Stay/Anesthesiology [facility] Phone [phone] [facility] Phone [phone] 07/28/2020 12:44 PM

## 2020-07-29 ENCOUNTER — Encounter (HOSPITAL_COMMUNITY)
Admission: RE | Disposition: A | Discharge status: Home / Self Care | Payer: Self-pay | Source: Home / Self Care | Attending: Pulmonary Disease

## 2020-07-29 ENCOUNTER — Encounter (HOSPITAL_COMMUNITY): Payer: Self-pay | Admitting: Pulmonary Disease

## 2020-07-29 ENCOUNTER — Ambulatory Visit (HOSPITAL_COMMUNITY)
Admission: RE | Admit: 2020-07-29 | Discharge: 2020-07-29 | Disposition: A | Discharge status: Home / Self Care | Payer: Medicare HMO | Attending: Pulmonary Disease | Admitting: Pulmonary Disease

## 2020-07-29 ENCOUNTER — Ambulatory Visit (HOSPITAL_COMMUNITY): Payer: Medicare HMO | Admitting: Vascular Surgery

## 2020-07-29 DIAGNOSIS — R918 Other nonspecific abnormal finding of lung field: Secondary | ICD-10-CM | POA: Diagnosis not present

## 2020-07-29 DIAGNOSIS — Z79899 Other long term (current) drug therapy: Secondary | ICD-10-CM | POA: Insufficient documentation

## 2020-07-29 DIAGNOSIS — Z950 Presence of cardiac pacemaker: Secondary | ICD-10-CM | POA: Insufficient documentation

## 2020-07-29 DIAGNOSIS — Z7983 Long term (current) use of bisphosphonates: Secondary | ICD-10-CM | POA: Insufficient documentation

## 2020-07-29 DIAGNOSIS — I69354 Hemiplegia and hemiparesis following cerebral infarction affecting left non-dominant side: Secondary | ICD-10-CM | POA: Insufficient documentation

## 2020-07-29 DIAGNOSIS — E785 Hyperlipidemia, unspecified: Secondary | ICD-10-CM | POA: Diagnosis not present

## 2020-07-29 DIAGNOSIS — Z7902 Long term (current) use of antithrombotics/antiplatelets: Secondary | ICD-10-CM | POA: Insufficient documentation

## 2020-07-29 DIAGNOSIS — Z87891 Personal history of nicotine dependence: Secondary | ICD-10-CM | POA: Insufficient documentation

## 2020-07-29 DIAGNOSIS — C801 Malignant (primary) neoplasm, unspecified: Secondary | ICD-10-CM | POA: Insufficient documentation

## 2020-07-29 DIAGNOSIS — C50911 Malignant neoplasm of unspecified site of right female breast: Secondary | ICD-10-CM | POA: Insufficient documentation

## 2020-07-29 DIAGNOSIS — Z79811 Long term (current) use of aromatase inhibitors: Secondary | ICD-10-CM | POA: Insufficient documentation

## 2020-07-29 DIAGNOSIS — Z7982 Long term (current) use of aspirin: Secondary | ICD-10-CM | POA: Diagnosis not present

## 2020-07-29 DIAGNOSIS — C771 Secondary and unspecified malignant neoplasm of intrathoracic lymph nodes: Secondary | ICD-10-CM | POA: Insufficient documentation

## 2020-07-29 DIAGNOSIS — R590 Localized enlarged lymph nodes: Secondary | ICD-10-CM | POA: Diagnosis present

## 2020-07-29 DIAGNOSIS — I10 Essential (primary) hypertension: Secondary | ICD-10-CM | POA: Insufficient documentation

## 2020-07-29 HISTORY — PX: BRONCHIAL BIOPSY: SHX5109

## 2020-07-29 HISTORY — PX: VIDEO BRONCHOSCOPY: SHX5072

## 2020-07-29 HISTORY — PX: BRONCHIAL NEEDLE ASPIRATION BIOPSY: SHX5106

## 2020-07-29 HISTORY — PX: ENDOBRONCHIAL ULTRASOUND: SHX5096

## 2020-07-29 HISTORY — PX: HEMOSTASIS CONTROL: SHX6838

## 2020-07-29 LAB — CBC
HCT: 30.3 % — ABNORMAL LOW (ref 36.0–46.0)
Hemoglobin: 9.2 g/dL — ABNORMAL LOW (ref 12.0–15.0)
MCH: 27.1 pg (ref 26.0–34.0)
MCHC: 30.4 g/dL (ref 30.0–36.0)
MCV: 89.1 fL (ref 80.0–100.0)
Platelets: 347 10*3/uL (ref 150–400)
RBC: 3.4 MIL/uL — ABNORMAL LOW (ref 3.87–5.11)
RDW: 15.6 % — ABNORMAL HIGH (ref 11.5–15.5)
WBC: 5.6 10*3/uL (ref 4.0–10.5)
nRBC: 0 % (ref 0.0–0.2)

## 2020-07-29 LAB — SAMPLE TO BLOOD BANK

## 2020-07-29 SURGERY — BRONCHOSCOPY, WITH FLUOROSCOPY
Anesthesia: General

## 2020-07-29 MED ORDER — ROCURONIUM BROMIDE 100 MG/10ML IV SOLN
INTRAVENOUS | Status: DC | PRN
  Administered 2020-07-29: 40 mg via INTRAVENOUS

## 2020-07-29 MED ORDER — CHLORHEXIDINE GLUCONATE 0.12 % MT SOLN
15.0000 mL | Freq: Once | OROMUCOSAL | Status: AC
  Administered 2020-07-29: 15 mL via OROMUCOSAL
  Filled 2020-07-29 (×2): qty 15

## 2020-07-29 MED ORDER — SODIUM CHLORIDE (PF) 0.9 % IJ SOLN
PREFILLED_SYRINGE | INTRAMUSCULAR | Status: DC | PRN
  Administered 2020-07-29: 4 mL

## 2020-07-29 MED ORDER — PROPOFOL 10 MG/ML IV BOLUS
INTRAVENOUS | Status: DC | PRN
  Administered 2020-07-29: 100 mg via INTRAVENOUS

## 2020-07-29 MED ORDER — ALBUTEROL SULFATE (2.5 MG/3ML) 0.083% IN NEBU
2.5000 mg | INHALATION_SOLUTION | Freq: Once | RESPIRATORY_TRACT | Status: AC
  Administered 2020-07-29: 2.5 mg via RESPIRATORY_TRACT

## 2020-07-29 MED ORDER — LACTATED RINGERS IV SOLN: INTRAVENOUS | Status: DC | PRN

## 2020-07-29 MED ORDER — EPINEPHRINE 1 MG/10ML IJ SOSY
PREFILLED_SYRINGE | INTRAMUSCULAR | Status: AC
  Filled 2020-07-29: qty 10

## 2020-07-29 MED ORDER — LIDOCAINE HCL URETHRAL/MUCOSAL 2 % EX GEL: 1.0000 "application " | Freq: Once | CUTANEOUS | Status: DC

## 2020-07-29 MED ORDER — ALBUTEROL SULFATE (2.5 MG/3ML) 0.083% IN NEBU
INHALATION_SOLUTION | RESPIRATORY_TRACT | Status: AC
  Filled 2020-07-29: qty 3

## 2020-07-29 MED ORDER — FENTANYL CITRATE (PF) 100 MCG/2ML IJ SOLN
INTRAMUSCULAR | Status: DC | PRN
  Administered 2020-07-29: 50 ug via INTRAVENOUS

## 2020-07-29 MED ORDER — ONDANSETRON HCL 4 MG/2ML IJ SOLN
INTRAMUSCULAR | Status: DC | PRN
  Administered 2020-07-29: 4 mg via INTRAVENOUS

## 2020-07-29 MED ORDER — LIDOCAINE HCL (CARDIAC) PF 100 MG/5ML IV SOSY
PREFILLED_SYRINGE | INTRAVENOUS | Status: DC | PRN
  Administered 2020-07-29: 30 mg via INTRAVENOUS

## 2020-07-29 MED ORDER — PHENYLEPHRINE HCL 0.25 % NA SOLN: 1.0000 | Freq: Four times a day (QID) | NASAL | Status: DC | PRN

## 2020-07-29 MED ORDER — LACTATED RINGERS IV SOLN: INTRAVENOUS | Status: DC

## 2020-07-29 MED ORDER — SUGAMMADEX SODIUM 200 MG/2ML IV SOLN
INTRAVENOUS | Status: DC | PRN
  Administered 2020-07-29: 200 mg via INTRAVENOUS

## 2020-07-29 NOTE — Interval H&P Note (Signed)
History and Physical Interval Note:  07/29/2020 11:53 AM  [name]  has presented today for surgery, with the diagnosis of lung mass.  The various methods of treatment have been discussed with the patient and family. After consideration of risks, benefits and other options for treatment, the patient has consented to  Procedure(s): VIDEO BRONCHOSCOPY WITH FLUORO (N/A) ENDOBRONCHIAL ULTRASOUND (N/A) as a surgical intervention.  The patient's history has been reviewed, patient examined, no change in status, stable for surgery.  I have reviewed the patient's chart and labs.  Questions were answered to the patient's satisfaction.    She denies any interval events Questions answered regarding procedure  Agreeable to proceed with bronchoscopy  Rosabell Geyer A Cynai Skeens

## 2020-07-29 NOTE — Anesthesia Postprocedure Evaluation (Signed)
Anesthesia Post Note  Patient: Jakelyn Berlinda Last  Procedure(s) Performed: VIDEO BRONCHOSCOPY WITH FLUORO (N/A ) ENDOBRONCHIAL ULTRASOUND (N/A ) BRONCHIAL NEEDLE ASPIRATION BIOPSIES BRONCHIAL BIOPSIES HEMOSTASIS CONTROL     Patient location during evaluation: PACU Anesthesia Type: General Level of consciousness: awake and alert, awake and oriented Pain management: pain level controlled Vital Signs Assessment: post-procedure vital signs reviewed and stable Respiratory status: spontaneous breathing, nonlabored ventilation and respiratory function stable Cardiovascular status: blood pressure returned to baseline, stable and tachycardic Postop Assessment: no apparent nausea or vomiting Anesthetic complications: no   No complications documented.  Last Vitals:  Vitals:   07/29/20 1350 07/29/20 1357  BP: (!) 105/56 (!) 143/72  Pulse: 90 (!) 104  Resp: (!) 22 16  Temp:    SpO2: 93% 95%    Last Pain:  Vitals:   07/29/20 1357  TempSrc:   PainSc: 0-No pain                 Catalina Gravel

## 2020-07-29 NOTE — Anesthesia Procedure Notes (Signed)
Procedure Name: Intubation Date/Time: 07/29/2020 12:28 PM Performed by: Eligha Bridegroom, CRNA Pre-anesthesia Checklist: Patient identified, Emergency Drugs available, Suction available and Patient being monitored Patient Re-evaluated:Patient Re-evaluated prior to induction Oxygen Delivery Method: Circle system utilized Preoxygenation: Pre-oxygenation with 100% oxygen Induction Type: IV induction Ventilation: Mask ventilation without difficulty and Oral airway inserted - appropriate to patient size Laryngoscope Size: Mac and 3 Grade View: Grade I Tube type: Oral Tube size: 8.0 mm Number of attempts: 1 Airway Equipment and Method: Stylet Secured at: 21 cm Tube secured with: Tape Dental Injury: Teeth and Oropharynx as per pre-operative assessment

## 2020-07-29 NOTE — Consult Note (Deleted)
NAME:  [name], MRN:  [id number], DOB:  [birth year], LOS: 0 ADMISSION DATE:  07/29/2020, CONSULTATION DATE:  3/28 REFERRING MD:  Cathlean Sauer, CHIEF COMPLAINT:  Lung mass   History of Present Illness:  This is a delightful 87 year old female who lives independently still.  She was in her usual state of health up until the evening hours of 3/27 when she began to have cough productive of teaspoon size hemoptysis.  She had had no sick exposures, no chest pain, no shortness of breath, no fevers or chills.  She was typically on Plavix.  Because of this new hemoptysis she presented to the emergency room for evaluation CT imaging of chest showed large right infrahilar lung mass with airway compression pulmonary asked to evaluate  Pertinent  Medical History  Ho breast cancer. H/o lumpectomy, still on Letrozole, prior stroke, CHB, HTN, chronic anemia  Significant Hospital Events: Including procedures, antibiotic start and stop dates in addition to other pertinent events   . 3/28 admitted for hemoptysis.  CT chest showed large right infrahilar lung mass with compression of the right lower lobe bronchus.  Was given nebulized tranexamic acid in the emergency room with resolution of hemoptysis pulmonary consulted  Interim History / Subjective:  Feels better  Objective   Blood pressure (!) 154/80, pulse 83, temperature 98.3 F (36.8 C), temperature source Oral, resp. rate 20, height _0  (1.549 m), weight 70.3 kg, SpO2 99 %.       No intake or output data in the 24 hours ending 07/29/20 1156 Filed Weights   07/29/20 1006  Weight: 70.3 kg    Examination: General: Pleasant 87 year old female resting in bed no acute distress HENT: Normocephalic atraumatic no jugular venous distention Lungs: Clear to auscultation currently on nasal cannula Cardiovascular: Regular rate and rhythm Abdomen: Soft not tender Extremities: Warm dry Neuro: Awake oriented GU: Due to void  Labs/imaging that I  havepersonally reviewed  (right click and "Reselect all SmartList Selections" daily)  CT chest image showed a large right infrahilar lung mass centrally located with occlusion of bronchus intermedius and noted post obstructive atelectasis  Resolved Hospital Problem list     Assessment & Plan:  Remote history of tobacco abuse History of left-sided breast cancer with lumpectomy Prior history of complete heart block with pacemaker History of CVA on Plavix Right lower lobe lung mass with associated hemoptysis   Central right lower lobe lung mass with post obstructive atelectasis and associated hemoptysis Hemoptysis resolved after inhaled transemic acid  Looks amenable to bronchoscopy.  Certainly concern for a malignancy here Plan Continue to hold Plavix and aspirin Dr. Vaughan Browner to follow, to discuss possible bronchoscopy with patient   Best practice (right click and "Reselect all SmartList Selections" daily)  Per primary   Labs   CBC: Recent Labs  Lab 07/24/20 2235 07/25/20 0422 07/29/20 1043  WBC 6.0 5.9 5.6  NEUTROABS 4.1 4.0  --   HGB 10.2* 8.5* 9.2*  HCT 32.9* 28.5* 30.3*  MCV 86.4 88.8 89.1  PLT 370 321 413    Basic Metabolic Panel: Recent Labs  Lab 07/24/20 2235 07/25/20 0105 07/25/20 0422  NA 139  --  140  K 2.8*  --  4.3  CL 102  --  109  CO2 29  --  26  GLUCOSE 107*  --  91  BUN 11  --  10  CREATININE 0.79  --  0.67  CALCIUM 8.9  --  8.2*  MG  --  1.3*  --  GFR: Estimated Creatinine Clearance: 46.9 mL/min (by C-G formula based on SCr of 0.67 mg/dL). Recent Labs  Lab 07/24/20 2235 07/25/20 0422 07/29/20 1043  WBC 6.0 5.9 5.6    Liver Function Tests: Recent Labs  Lab 07/25/20 0422  AST 15  ALT 8  ALKPHOS 40  BILITOT 1.1  PROT 6.1*  ALBUMIN 2.0*   No results for input(s): LIPASE, AMYLASE in the last 168 hours. No results for input(s): AMMONIA in the last 168 hours.  ABG No results found for: PHART, PCO2ART, PO2ART, HCO3, TCO2,  ACIDBASEDEF, O2SAT   Coagulation Profile: Recent Labs  Lab 07/24/20 2235  INR 1.1    Cardiac Enzymes: No results for input(s): CKTOTAL, CKMB, CKMBINDEX, TROPONINI in the last 168 hours.  HbA1C: Hgb A1c MFr Bld  Date/Time Value Ref Range Status  05/28/2018 03:53 AM 6.3 (H) 4.8 - 5.6 % Final    Comment:    (NOTE) Pre diabetes:          5.7%-6.4% Diabetes:              >6.4% Glycemic control for   <7.0% adults with diabetes     CBG: No results for input(s): GLUCAP in the last 168 hours.  Review of Systems:   Review of Systems  Constitutional: Positive for weight loss. Negative for chills and fever.       She estimates about 10 pounds of unintentional weight loss over the last 2 weeks, more weight loss over the month prior  HENT: Negative for congestion, nosebleeds and sinus pain.   Eyes: Negative.   Respiratory: Positive for cough and hemoptysis. Negative for shortness of breath.   Cardiovascular: Negative for chest pain, claudication and leg swelling.  Gastrointestinal: Negative.   Genitourinary: Negative.   Musculoskeletal: Negative.   Neurological: Negative.   Endo/Heme/Allergies: Negative.   Psychiatric/Behavioral: Negative.      Past Medical History:  She,  has a past medical history of Breast cancer, right ([place]) (11/2018), Complete heart block ([place]), Gait abnormality (07/23/2016), GERD (gastroesophageal reflux disease), Hemoptysis (07/25/2020), History of hypertension, History of stroke, Hyperhomocysteinemia ([place]), Hyperlipidemia, Hypertension, Hypokalemia (07/13/2018), Low blood magnesium (07/13/2018), Pacemaker-Medtronic (08/07/2011), Stroke ([place]), and Vomiting (07/13/2018).   Surgical History:   Past Surgical History:  Procedure Laterality Date  . BIOPSY  07/15/2018   Procedure: BIOPSY;  Surgeon: [name], MD;  Location: [facility];  Service: Gastroenterology;;  . BREAST BIOPSY Right 10/27/2018   affirm bx of mass, x clip,  INVASIVE MAMMARY CARCINOMA  .  BREAST LUMPECTOMY Right 12/03/2018  . CARDIAC CATHETERIZATION    . ESOPHAGOGASTRODUODENOSCOPY Left 07/15/2018   Procedure: ESOPHAGOGASTRODUODENOSCOPY (EGD);  Surgeon: [name], MD;  Location: [facility];  Service: Gastroenterology;  Laterality: Left;  . PACEMAKER INSERTION     Medtronic Enpulse dual-chamber pacemaker  . PARTIAL MASTECTOMY WITH NEEDLE LOCALIZATION AND AXILLARY SENTINEL LYMPH NODE BX Right 12/03/2018   Procedure: PARTIAL MASTECTOMY WITH NEEDLE LOCALIZATION AND AXILLARY SENTINEL LYMPH NODE BX, RIGHT;  Surgeon: [name], MD;  Location: [facility] ORS;  Service: General;  Laterality: Right;  . PERMANENT PACEMAKER GENERATOR CHANGE N/A 07/30/2013   Procedure: PERMANENT PACEMAKER GENERATOR CHANGE;  Surgeon: [name], MD;  [facility] CATH LAB;  Service: Cardiovascular;  Laterality: N/A;     Social History:   reports that she quit smoking about 14 years ago. She has never used smokeless tobacco. She reports that she does not drink alcohol and does not use drugs.   Family History:  Her family history  includes Breast cancer in her sister and sister; Hypertension in her mother. There is no history of Colon cancer or Esophageal cancer.   Allergies No Known Allergies   Home Medications  Prior to Admission medications   Medication Sig Start Date End Date Taking? Authorizing Provider  acetaminophen (TYLENOL) 500 MG tablet Take 500-1,000 mg by mouth every 6 (six) hours as needed for moderate pain.     [provider]  alendronate (FOSAMAX) 70 MG tablet TAKE 1 TABLET (70 MG TOTAL) BY MOUTH ONCE A WEEK. TAKE WITH A FULL GLASS OF WATER ON AN EMPTY STOMACH. 03/14/20   [name], MD  allopurinol (ZYLOPRIM) 100 MG tablet Take 100 mg by mouth daily. 11/28/19   [provider]  amLODipine (NORVASC) 5 MG tablet Take 5 mg by mouth daily.    [provider]  aspirin 81 MG EC tablet Take 81 mg by mouth daily.     [provider]  Blood  Pressure KIT Automated blood pressure measuring device. 06/29/18   [name], MD  clopidogrel (PLAVIX) 75 MG tablet Take 75 mg by mouth daily with breakfast.    [provider]  hydrochlorothiazide (HYDRODIURIL) 25 MG tablet Take 25 mg by mouth daily. 10/19/19   [provider]  letrozole ([place]) 2.5 MG tablet TAKE 1 TABLET BY MOUTH EVERY DAY 09/30/19   Finnegan, [name], MD  LUMIGAN 0.01 % SOLN Place 1 drop into both eyes at bedtime. 01/13/18   [provider]  mirtazapine (REMERON) 7.5 MG tablet Take 7.5 mg by mouth at bedtime.  Patient not taking: Reported on 06/28/2020 09/11/18   [provider]  pantoprazole (PROTONIX) 40 MG tablet Take 40 mg by mouth daily.    [provider]  Potassium 99 MG TABS Take 99 mg by mouth daily.    [provider]  simvastatin (ZOCOR) 40 MG tablet Take 1 tablet (40 mg total) by mouth daily at 6 PM. 06/28/20   [name], MD     Critical care time: NA   Erick Colace ACNP-BC [place] Pager # [phone] OR # [phone] if no answer

## 2020-07-29 NOTE — Brief Op Note (Signed)
Flexible and EBUS Bronchoscopy Procedure Note  [name]  [id number]  Mar 21, 1934  Date:07/29/20  Time:1:22 PM   Provider Performing:Destenee Guerry A [name]   Procedure: Flexible bronchoscopy and EBUS Bronchoscopy  Indication(s) Hemoptysis, lung mass  Consent Risks of the procedure as well as the alternatives and risks of each were explained to the patient and/or caregiver.  Consent for the procedure was obtained.  Anesthesia General Anesthesia   Time Out Verified patient identification, verified procedure, site/side was marked, verified correct patient position, special equipment/implants available, medications/allergies/relevant history reviewed, required imaging and test results available.   Sterile Technique Usual hand hygiene, masks, gowns, and gloves were used   Procedure Description Diagnostic bronchoscope advanced through endotracheal tube and into airway.  Airways were examined down to subsegmental level with findings noted below-mass with a blood clot occluding the bronchus intermedius, right upper lobe airway narrowing with endobronchial infiltration.  Following diagnostic evaluation,The diagnostic bronchoscope was then removed and the EBUS bronchoscope was advanced into airway with stations 7 biopsied and sent for slide, cell block, and/or culture.  The EBUS bronchoscope was removed after assuring no active bleeding from biopsy site.  Diagnostic bronchoscope was introduced Airway noted for clots occluding the right mainstem, multiple attempts at suctioning and irrigating the airway finally led to being able to remove the clot.  Airway noted for right bronchus intermedius mass lesion 6 attempts at biopsies performed with good retrieval of tissue 4 cc of 1:100,000 epinephrine instilled  No significant bleeding noted  Findings: Bronchus intermedius mass, clot in the right bronchus intermedius, right upper lobe endobronchial infiltration Left main, left upper lobe  left lower lobe and lingula segment noted to be free of lesions   Complications/Tolerance None; patient tolerated the procedure well. Chest X-ray is not needed post procedure.   EBL Minimal, about 15 cc   Specimen(s)  lymph node aspirate station 7 Right bronchus intermedius mass lesion

## 2020-07-29 NOTE — Discharge Instructions (Signed)
Diagnostic Bronchoscopy Procedure Note   [name]  [id number]  1934/03/17  Date:07/29/20  Time:1:42 PM   Provider Performing:Ashonti Leandro W [place] Ped  Procedure: Diagnostic Bronchoscopy [phone])  Indication(s) Assist with direct visualization of tracheostomy placement  Consent Risks of the procedure as well as the alternatives and risks of each were explained to the patient and/or caregiver.  Consent for the procedure was obtained.   Anesthesia See separate tracheostomy note   Time Out Verified patient identification, verified procedure, site/side was marked, verified correct patient position, special equipment/implants available, medications/allergies/relevant history reviewed, required imaging and test results available.   Sterile Technique Usual hand hygiene, masks, gowns, and gloves were used   Procedure Description Bronchoscope advanced through endotracheal tube and into airway.  After suctioning out tracheal secretions, bronchoscope used to provide direct visualization of tracheostomy placement.   Complications/Tolerance {[id number]::"WYOV; patient tolerated the procedure well."}.   EBL None   Specimen(s) None

## 2020-07-29 NOTE — Transfer of Care (Signed)
Immediate Anesthesia Transfer of Care Note  Patient: [name] Last  Procedure(s) Performed: VIDEO BRONCHOSCOPY WITH FLUORO (N/A ) ENDOBRONCHIAL ULTRASOUND (N/A ) BRONCHIAL NEEDLE ASPIRATION BIOPSIES BRONCHIAL BIOPSIES HEMOSTASIS CONTROL  Patient Location: PACU and Endoscopy Unit  Anesthesia Type:General  Level of Consciousness: awake and alert   Airway & Oxygen Therapy: Patient Spontanous Breathing and Patient connected to nasal cannula oxygen  Post-op Assessment: Report given to RN and Post -op Vital signs reviewed and stable  Post vital signs: Reviewed and stable  Last Vitals:  Vitals Value Taken Time  BP 124/86 07/29/20 1328  Temp    Pulse 72 07/29/20 1330  Resp 16 07/29/20 1330  SpO2 89 % 07/29/20 1330  Vitals shown include unvalidated device data.  Last Pain:  Vitals:   07/29/20 1016  TempSrc:   PainSc: 0-No pain      Patients Stated Pain Goal: 3 (50/09/38 1829)  Complications: No complications documented.

## 2020-07-29 NOTE — Op Note (Signed)
[facility] Cardiopulmonary Patient Name: [name] Pocedure Date: 07/29/2020 MRN: [id number] Attending MD:  A  MD, MD Date of Birth: 02/23/1934 CSN: Finalized Age: 87 Admit Type: Outpatient Gender: Female Procedure:             Bronchoscopy Indications:           Mediastinal adenopathy, Right lower lobe mass Providers:              A.  MD, MD, Maggie Chrismon, RN,                         Darlene H. Davis, Technician Referring MD:           Medicines:             General Anesthesia Complications:         No immediate complications Estimated Blood Loss:  15cc                        15cc Procedure:             Pre-Anesthesia Assessment:                        - A History and Physical has been performed. The                         patient's medications, allergies and sensitivities                         have been reviewed.                        After obtaining informed consent, the bronchoscope was                         passed under direct vision. Throughout the procedure,                         the patient's blood pressure, pulse, and oxygen                         saturations were monitored continuously. the [id number]                         ([id number]) Olympus Therapeutic Bronchoscope was                         introduced through the mouth, via the endotracheal                         tube (the patient was intubated for the procedure) and                         advanced to the tracheobronchial tree of both lungs.                         The procedure was accomplished without difficulty. The                         patient tolerated the procedure well.                          EBUS perfomed with aspiration of station 7 LN Scope In: Scope Out: Findings:      Right Lung Abnormalities: A completely obstructing mass was found       proximally, at the orifice in the bronchus intermedius. The mass was       large and bloody and  exophytic. The lesion was not traversed.      The scope was withdrawn and replaced with the EBUS bronchoscope to       accomplish the ultrasound examination.      Lymph Nodes: An endobronchial ultrasound endoscope was utilized to       systematically examine the subcarinal mediastinum (level 7) in order to       assist with fine needle aspiration. Lymph node staging was performed via       endobronchial ultrasound for suspected lung cancer.      - The 7 (subcarinal) node was 20 mm by EBUS. Impression:            right lun mass Moderate Sedation:      An independent trained observer was present and continuously monitored       the patient. Recommendation:        - The patient will be observed post-procedure, until                         all discharge criteria are met.                        - Follow up in clinic in one week. Procedure Code(s):     --- Professional ---                        [phone], Bronchoscopy, rigid or flexible, including                         fluoroscopic guidance, when performed; diagnostic,                         with cell washing, when performed (separate procedure)                        31654, Bronchoscopy, rigid or flexible, including                         fluoroscopic guidance, when performed; with                         transendoscopic endobronchial ultrasound (EBUS) during                         bronchoscopic diagnostic or therapeutic                         intervention(s) for peripheral lesion(s) (List                         separately in addition to code for primary                         procedure[s]) Diagnosis Code(s):     --- Professional ---  R59.0, Localized enlarged lymph nodes                        R91.8, Other nonspecific abnormal finding of lung field CPT copyright 2019 American Medical Association. All rights reserved. The codes documented in this report are preliminary and upon coder review may  be revised to  meet current compliance requirements. Sherrilyn Rist, MD Laurin Coder MD, MD 07/29/2020 1:59:24 PM This report has been signed electronically. Number of Addenda: 0

## 2020-07-29 NOTE — Consult Note (Signed)
Patient in today for bronchoscopy  Was recently managed in the hospital for hemoptysis Has had anticoagulants held  Denies any significant symptoms at present Physical exam is unremarkable  Large right lung mass for bronchoscopy  Patient agreeable to proceed with intervention  Sherrilyn Rist, MD [facility] Pager: [phone]

## 2020-07-31 ENCOUNTER — Encounter (HOSPITAL_COMMUNITY): Payer: Self-pay | Admitting: Pulmonary Disease

## 2020-08-02 ENCOUNTER — Telehealth: Payer: Self-pay | Admitting: Pulmonary Disease

## 2020-08-02 LAB — CYTOLOGY - NON PAP

## 2020-08-02 NOTE — Telephone Encounter (Signed)
Updated patient's sister Tito Dine about biopsy findings-patient had informed me to call her sister with the results when they get released  Diagnosis is squamous cell cancer  Ms. Aufiero has an appointment on Thursday 08/04/20 with oncology according to her sister

## 2020-08-04 ENCOUNTER — Encounter: Payer: Self-pay | Admitting: Oncology

## 2020-08-04 ENCOUNTER — Inpatient Hospital Stay: Payer: Medicare HMO | Attending: Oncology | Admitting: Oncology

## 2020-08-04 VITALS — BP 140/73 | HR 88 | Temp 99.3°F | Resp 20 | Wt 155.4 lb

## 2020-08-04 DIAGNOSIS — I10 Essential (primary) hypertension: Secondary | ICD-10-CM | POA: Insufficient documentation

## 2020-08-04 DIAGNOSIS — E7211 Homocystinuria: Secondary | ICD-10-CM | POA: Insufficient documentation

## 2020-08-04 DIAGNOSIS — Z170 Estrogen receptor positive status [ER+]: Secondary | ICD-10-CM | POA: Insufficient documentation

## 2020-08-04 DIAGNOSIS — Z79899 Other long term (current) drug therapy: Secondary | ICD-10-CM | POA: Insufficient documentation

## 2020-08-04 DIAGNOSIS — M810 Age-related osteoporosis without current pathological fracture: Secondary | ICD-10-CM | POA: Insufficient documentation

## 2020-08-04 DIAGNOSIS — D649 Anemia, unspecified: Secondary | ICD-10-CM | POA: Insufficient documentation

## 2020-08-04 DIAGNOSIS — Z7901 Long term (current) use of anticoagulants: Secondary | ICD-10-CM | POA: Insufficient documentation

## 2020-08-04 DIAGNOSIS — Z8673 Personal history of transient ischemic attack (TIA), and cerebral infarction without residual deficits: Secondary | ICD-10-CM | POA: Insufficient documentation

## 2020-08-04 DIAGNOSIS — C3491 Malignant neoplasm of unspecified part of right bronchus or lung: Secondary | ICD-10-CM | POA: Diagnosis not present

## 2020-08-04 DIAGNOSIS — C3492 Malignant neoplasm of unspecified part of left bronchus or lung: Secondary | ICD-10-CM | POA: Insufficient documentation

## 2020-08-04 DIAGNOSIS — R918 Other nonspecific abnormal finding of lung field: Secondary | ICD-10-CM

## 2020-08-04 DIAGNOSIS — I442 Atrioventricular block, complete: Secondary | ICD-10-CM | POA: Insufficient documentation

## 2020-08-04 DIAGNOSIS — Z87891 Personal history of nicotine dependence: Secondary | ICD-10-CM | POA: Insufficient documentation

## 2020-08-04 DIAGNOSIS — E785 Hyperlipidemia, unspecified: Secondary | ICD-10-CM | POA: Insufficient documentation

## 2020-08-04 DIAGNOSIS — C50411 Malignant neoplasm of upper-outer quadrant of right female breast: Secondary | ICD-10-CM | POA: Insufficient documentation

## 2020-08-04 DIAGNOSIS — C3490 Malignant neoplasm of unspecified part of unspecified bronchus or lung: Secondary | ICD-10-CM | POA: Diagnosis not present

## 2020-08-04 DIAGNOSIS — K219 Gastro-esophageal reflux disease without esophagitis: Secondary | ICD-10-CM | POA: Insufficient documentation

## 2020-08-04 DIAGNOSIS — Z79811 Long term (current) use of aromatase inhibitors: Secondary | ICD-10-CM | POA: Insufficient documentation

## 2020-08-05 ENCOUNTER — Other Ambulatory Visit: Payer: Self-pay | Admitting: *Deleted

## 2020-08-05 DIAGNOSIS — C3490 Malignant neoplasm of unspecified part of unspecified bronchus or lung: Secondary | ICD-10-CM

## 2020-08-05 DIAGNOSIS — C3491 Malignant neoplasm of unspecified part of right bronchus or lung: Secondary | ICD-10-CM | POA: Insufficient documentation

## 2020-08-05 NOTE — Progress Notes (Signed)
a 

## 2020-08-08 ENCOUNTER — Telehealth: Payer: Self-pay | Admitting: *Deleted

## 2020-08-08 ENCOUNTER — Emergency Department
Admission: EM | Admit: 2020-08-08 | Discharge: 2020-08-08 | Disposition: A | Discharge status: Home / Self Care | Payer: Medicare HMO | Attending: Emergency Medicine | Admitting: Emergency Medicine

## 2020-08-08 ENCOUNTER — Other Ambulatory Visit: Payer: Self-pay

## 2020-08-08 ENCOUNTER — Telehealth (INDEPENDENT_AMBULATORY_CARE_PROVIDER_SITE_OTHER): Payer: Self-pay

## 2020-08-08 ENCOUNTER — Encounter: Payer: Self-pay | Admitting: Emergency Medicine

## 2020-08-08 DIAGNOSIS — Z87891 Personal history of nicotine dependence: Secondary | ICD-10-CM | POA: Diagnosis not present

## 2020-08-08 DIAGNOSIS — Z7982 Long term (current) use of aspirin: Secondary | ICD-10-CM | POA: Insufficient documentation

## 2020-08-08 DIAGNOSIS — R040 Epistaxis: Secondary | ICD-10-CM | POA: Diagnosis not present

## 2020-08-08 DIAGNOSIS — I10 Essential (primary) hypertension: Secondary | ICD-10-CM | POA: Insufficient documentation

## 2020-08-08 DIAGNOSIS — Z7902 Long term (current) use of antithrombotics/antiplatelets: Secondary | ICD-10-CM | POA: Diagnosis not present

## 2020-08-08 DIAGNOSIS — Z79899 Other long term (current) drug therapy: Secondary | ICD-10-CM | POA: Insufficient documentation

## 2020-08-08 DIAGNOSIS — Z950 Presence of cardiac pacemaker: Secondary | ICD-10-CM | POA: Insufficient documentation

## 2020-08-08 DIAGNOSIS — Z853 Personal history of malignant neoplasm of breast: Secondary | ICD-10-CM | POA: Insufficient documentation

## 2020-08-08 MED ORDER — OXYMETAZOLINE HCL 0.05 % NA SOLN
1.0000 | Freq: Once | NASAL | Status: AC
  Administered 2020-08-08: 1 via NASAL
  Filled 2020-08-08: qty 30

## 2020-08-08 NOTE — Telephone Encounter (Signed)
Ok thanks 

## 2020-08-08 NOTE — ED Provider Notes (Addendum)
[facility] Emergency Department Provider Note   ____________________________________________   Event Date/Time   First MD Initiated Contact with Patient 08/08/20 1654     (approximate)  I have reviewed the triage vital signs and the nursing notes.   HISTORY  Chief Complaint Epistaxis    HPI [name] is a 87 y.o. female with past medical history of hypertension, hyperlipidemia, stroke, complete heart block status post pacemaker, and paroxysmal atrial fibrillation who presents to the ED complaining of epistaxis.  Patient reports that she developed bleeding from the left side of her nose around 1 PM today.  It has seemed to be constantly oozing since then, but she has not noticed any blood coming down the back of her throat.  Pressure was applied by EMS and patient now has tissue stuffed in her left nare.  She feels like bleeding has stopped.  She does take aspirin and Plavix daily.        Past Medical History:  Diagnosis Date  . Breast cancer, right ([place]) 11/2018   Mastectomy  . Complete heart block ([place])   . Gait abnormality 07/23/2016  . GERD (gastroesophageal reflux disease)   . Hemoptysis 07/25/2020  . History of hypertension   . History of stroke   . Hyperhomocysteinemia ([place])   . Hyperlipidemia   . Hypertension   . Hypokalemia 07/13/2018  . Low blood magnesium 07/13/2018  . Pacemaker-Medtronic 08/07/2011  . Stroke [facility])    left sided weakness, 07/28/20- no residual   . Vomiting 07/13/2018    Patient Active Problem List   Diagnosis Date Noted  . Squamous cell carcinoma of right lung ([place]) 08/05/2020  . Hemoptysis 07/25/2020  . Malignant neoplasm of upper-outer quadrant of right breast in female, estrogen receptor positive ([place]) 10/31/2018  . Enteritis due to Clostridium difficile   . Malnutrition of moderate degree 07/15/2018  . Cough   . Ileus ([place])   . Low blood magnesium 07/13/2018  . Vomiting 07/13/2018  . Loss of weight  06/27/2018  . Rhabdomyolysis 05/26/2018  . History of CVA (cerebrovascular accident) 05/26/2018  . Essential hypertension 05/26/2018  . Hyperlipidemia 05/26/2018  . AKI (acute kidney injury) ([place]) 05/26/2018  . Transaminitis 05/26/2018  . Elevated troponin 05/26/2018  . Gait abnormality 07/23/2016  . Pacemaker-Medtronic 08/07/2011  . Atrial fibrillation ([place]) 08/07/2011  . Daytime somnolence 08/07/2011  . HYPERTENSION, HEART CONTROLLED W/O ASSOC CHF 06/07/2010  . AV BLOCK, COMPLETE 06/07/2010  . Cerebral artery occlusion with cerebral infarction ([place]) 06/07/2010    Past Surgical History:  Procedure Laterality Date  . BIOPSY  07/15/2018   Procedure: BIOPSY;  Surgeon: [name], MD;  Location: [facility];  Service: Gastroenterology;;  . BREAST BIOPSY Right 10/27/2018   affirm bx of mass, x clip,  INVASIVE MAMMARY CARCINOMA  . BREAST LUMPECTOMY Right 12/03/2018  . BRONCHIAL BIOPSY  07/29/2020   Procedure: BRONCHIAL BIOPSIES;  Surgeon: Laurin Coder, MD;  Location: [facility] ENDOSCOPY;  Service: Pulmonary;;  . BRONCHIAL NEEDLE ASPIRATION BIOPSY  07/29/2020   Procedure: BRONCHIAL NEEDLE ASPIRATION BIOPSIES;  Surgeon: Laurin Coder, MD;  Location: [facility] ENDOSCOPY;  Service: Pulmonary;;  . CARDIAC CATHETERIZATION    . ENDOBRONCHIAL ULTRASOUND N/A 07/29/2020   Procedure: ENDOBRONCHIAL ULTRASOUND;  Surgeon: Laurin Coder, MD;  Location: [facility] ENDOSCOPY;  Service: Pulmonary;  Laterality: N/A;  . ESOPHAGOGASTRODUODENOSCOPY Left 07/15/2018   Procedure: ESOPHAGOGASTRODUODENOSCOPY (EGD);  Surgeon: [name], MD;  Location: [facility];  Service: Gastroenterology;  Laterality: Left;  .  HEMOSTASIS CONTROL  07/29/2020   Procedure: HEMOSTASIS CONTROL;  Surgeon: Laurin Coder, MD;  Location: [facility] ENDOSCOPY;  Service: Pulmonary;;  . PACEMAKER INSERTION     Medtronic Enpulse dual-chamber pacemaker  . PARTIAL MASTECTOMY WITH NEEDLE LOCALIZATION AND AXILLARY SENTINEL LYMPH NODE BX Right  12/03/2018   Procedure: PARTIAL MASTECTOMY WITH NEEDLE LOCALIZATION AND AXILLARY SENTINEL LYMPH NODE BX, RIGHT;  Surgeon: [name], MD;  Location: [facility] ORS;  Service: General;  Laterality: Right;  . PERMANENT PACEMAKER GENERATOR CHANGE N/A 07/30/2013   Procedure: PERMANENT PACEMAKER GENERATOR CHANGE;  Surgeon: [name], MD;  Location: [facility] CATH LAB;  Service: Cardiovascular;  Laterality: N/A;  . VIDEO BRONCHOSCOPY N/A 07/29/2020   Procedure: VIDEO BRONCHOSCOPY WITH FLUORO;  Surgeon: Laurin Coder, MD;  Location: [facility] ENDOSCOPY;  Service: Pulmonary;  Laterality: N/A;    Prior to Admission medications   Medication Sig Start Date End Date Taking? Authorizing Provider  acetaminophen (TYLENOL) 500 MG tablet Take 500-1,000 mg by mouth every 6 (six) hours as needed for moderate pain.     [provider]  alendronate (FOSAMAX) 70 MG tablet TAKE 1 TABLET (70 MG TOTAL) BY MOUTH ONCE A WEEK. TAKE WITH A FULL GLASS OF WATER ON AN EMPTY STOMACH. 03/14/20   [name], MD  allopurinol (ZYLOPRIM) 100 MG tablet Take 100 mg by mouth daily. 11/28/19   [provider]  amLODipine (NORVASC) 5 MG tablet Take 5 mg by mouth daily.    [provider]  aspirin EC 81 MG tablet Take 81 mg by mouth daily. Swallow whole.    [provider]  Blood Pressure KIT Automated blood pressure measuring device. 06/29/18   [name], MD  clopidogrel (PLAVIX) 75 MG tablet Take 75 mg by mouth daily.    [provider]  hydrochlorothiazide (HYDRODIURIL) 25 MG tablet Take 25 mg by mouth daily. 10/19/19   [provider]  letrozole ([place]) 2.5 MG tablet TAKE 1 TABLET BY MOUTH EVERY DAY Patient taking differently: Take 2.5 mg by mouth daily. 09/30/19   Finnegan, [name], MD  LUMIGAN 0.01 % SOLN Place 1 drop into both eyes at bedtime. 01/13/18   [provider]  pantoprazole (PROTONIX) 40 MG tablet Take 40 mg by mouth daily.    [provider]   Potassium 99 MG TABS Take 99 mg by mouth daily.    [provider]  simvastatin (ZOCOR) 40 MG tablet Take 1 tablet (40 mg total) by mouth daily at 6 PM. 06/28/20   [name], MD    Allergies Patient has no known allergies.  Family History  Problem Relation Age of Onset  . Hypertension Mother   . Breast cancer Sister   . Breast cancer Sister   . Colon cancer Neg Hx   . Esophageal cancer Neg Hx     Social History Social History   Tobacco Use  . Smoking status: Former Smoker    Quit date: 07/25/2006    Years since quitting: 14.0  . Smokeless tobacco: Never Used  Vaping Use  . Vaping Use: Never used  Substance Use Topics  . Alcohol use: No  . Drug use: No    Review of Systems  Constitutional: No fever/chills Eyes: No visual changes. ENT: No sore throat.  Positive for epistaxis. Cardiovascular: Denies chest pain. Respiratory: Denies shortness of breath. Gastrointestinal: No abdominal pain.  No nausea, no vomiting.  No diarrhea.  No constipation. Genitourinary: Negative for dysuria. Musculoskeletal: Negative for back pain.  Skin: Negative for rash. Neurological: Negative for headaches, focal weakness or numbness.  ____________________________________________   PHYSICAL EXAM:  VITAL SIGNS: ED Triage Vitals  Enc Vitals Group     BP 08/08/20 1709 (!) 141/67     Pulse Rate 08/08/20 1709 80     Resp 08/08/20 1709 18     Temp 08/08/20 1709 97.8 F (36.6 C)     Temp Source 08/08/20 1709 Oral     SpO2 08/08/20 1709 95 %     Weight 08/08/20 1657 155 lb (70.3 kg)     Height 08/08/20 1657 5' 1"  (1.549 m)     Head Circumference --      Peak Flow --      Pain Score --      Pain Loc --      Pain Edu? --      Excl. in [place]? --     Constitutional: Alert and oriented. Eyes: Conjunctivae are normal. Head: Atraumatic. Nose: Dried blood in left nare with no active bleeding noted. Mouth/Throat: Mucous membranes are moist. Neck: Normal ROM Cardiovascular:  Normal rate, regular rhythm. Grossly normal heart sounds. Respiratory: Normal respiratory effort.  No retractions. Lungs CTAB. Gastrointestinal: Soft and nontender. No distention. Genitourinary: deferred Musculoskeletal: No lower extremity tenderness nor edema. Neurologic:  Normal speech and language. No gross focal neurologic deficits are appreciated. Skin:  Skin is warm, dry and intact. No rash noted. Psychiatric: Mood and affect are normal. Speech and behavior are normal.  ____________________________________________   LABS (all labs ordered are listed, but only abnormal results are displayed)  Labs Reviewed - No data to display  PROCEDURES  Procedure(s) performed (including Critical Care):  Procedures   ____________________________________________   INITIAL IMPRESSION / ASSESSMENT AND PLAN / ED COURSE       87 year old female with past medical history of hypertension, hyperlipidemia, stroke, complete heart block status post pacemaker, and atrial fibrillation on aspirin and Plavix who presents to the ED with nosebleed starting around 1 PM this afternoon.  Bleeding currently appears well controlled, no active bleeding noted with removal of Kleenex from left nare.  We will observe patient here in the ED to ensure no recurrent epistaxis, she denies any symptoms of anemia.  Reviewing patient's chart, she was recently admitted to the hospital for hemoptysis and discovered to have a lung mass.  She has not had issues with hemoptysis since then, did cough up a couple small clots here in the ED, but this appears likely to be related to some swallowed blood from her epistaxis.  Epistaxis has now resolved following administration of Afrin and removal of nasal clamp.  She has recently started using nasal cannula and niece counseled to apply Vaseline to her nose to keep mucosa moist and less likely to bleed.  She is appropriate for discharge home with PCP and oncology follow-up, counseled to  return to the ED for new or worsening symptoms.  Patient and family agree with plan.      ____________________________________________   FINAL CLINICAL IMPRESSION(S) / ED DIAGNOSES  Final diagnoses:  Epistaxis     ED Discharge Orders    None       Note:  This document was prepared using Dragon voice recognition software and may include unintentional dictation errors.   Blake Divine, MD 08/08/20 Sharilyn Sites    Blake Divine, MD 08/08/20 [phone]

## 2020-08-08 NOTE — ED Notes (Signed)
Walked pt with walker to the restroom.

## 2020-08-08 NOTE — Telephone Encounter (Addendum)
Patient sister Vivien Rota called reporting that patient has a nose bleed that will not stop. It is continuously dripping. I spoke with Lorretta Harp, NP who said that if is will not stop that patient should go to ER. I informed Vivien Rota of this and she she said "I already called and they are coming and I am having her taken to [facility]

## 2020-08-08 NOTE — ED Triage Notes (Signed)
Presents via EMS from home  Per EMS she developed a nosebleed about 1 pm today

## 2020-08-08 NOTE — ED Notes (Signed)
Walked pt back to room with walker. Pt is resting in bed.

## 2020-08-08 NOTE — Telephone Encounter (Signed)
Attempted to contact patient's daughter and received a busy signal. I contacted the patient's sister and per the sister she will wait until her doctor's appt at the [facility] and then call to schedule the patient for a port placement.

## 2020-08-09 NOTE — Telephone Encounter (Signed)
Spoke with patient's niece [name] that is on her DPR, patient is scheduled for a port placement with Dr. Delana Meyer on 08/16/20 with a 6:45 am arrival time to the [facility]. Per pre-admit and new guidelines the patient will not need a covid test. Pre-procedure instructions will be faxed.

## 2020-08-10 ENCOUNTER — Other Ambulatory Visit: Payer: Medicare HMO

## 2020-08-10 ENCOUNTER — Ambulatory Visit: Payer: Medicare HMO

## 2020-08-10 ENCOUNTER — Other Ambulatory Visit (INDEPENDENT_AMBULATORY_CARE_PROVIDER_SITE_OTHER): Payer: Self-pay | Admitting: Nurse Practitioner

## 2020-08-12 ENCOUNTER — Other Ambulatory Visit: Admission: RE | Admit: 2020-08-12 | Payer: Medicare HMO | Source: Ambulatory Visit

## 2020-08-14 NOTE — Progress Notes (Signed)
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  INTERVAL HISTORY: Patient returns to clinic today for further evaluation, discussion of her imaging results, and treatment planning.  She currently feels well and is asymptomatic.  She has no neurologic complaints.  She denies any recent fevers.  She admits to weight loss in rehab several months ago, but states this has stabilized.  She denies any chest pain, shortness of breath, hemoptysis, or cough.  She denies any nausea, vomiting, constipation, or diarrhea.  She has no urinary complaints.  Patient offers no specific complaints today.    REVIEW OF SYSTEMS:   Review of Systems  Constitutional: Negative.  Negative for fever, malaise/fatigue and weight loss.  Respiratory: Negative.  Negative for cough, hemoptysis and shortness of breath.   Cardiovascular: Negative.  Negative for chest pain and leg swelling.  Gastrointestinal: Negative.  Negative for abdominal pain.  Genitourinary: Negative.  Negative for dysuria.  Musculoskeletal: Negative.  Negative for back pain.  Skin: Negative.  Negative for rash.  Neurological: Negative.  Negative for dizziness, focal weakness, weakness and headaches.  Psychiatric/Behavioral: Negative.  The patient is not nervous/anxious.     As per HPI. Otherwise, a complete review of systems is negative.  PAST MEDICAL HISTORY: Past Medical History:  Diagnosis Date  . Breast cancer, right ([place])  11/2018   Mastectomy  . Complete heart block ([place])   . Dyspnea   . Gait abnormality 07/23/2016  . GERD (gastroesophageal reflux disease)   . Hemoptysis 07/25/2020  . History of hypertension   . History of stroke   . Hyperhomocysteinemia ([place])   . Hyperlipidemia   . Hypertension   . Hypokalemia 07/13/2018  . Low blood magnesium 07/13/2018  . Lung cancer ([place])   . Pacemaker-Medtronic 08/07/2011  . Stroke [facility])    left sided weakness, 07/28/20- no residual   . Vomiting 07/13/2018    PAST SURGICAL HISTORY: Past Surgical History:  Procedure Laterality Date  . BIOPSY  07/15/2018   Procedure: BIOPSY;  Surgeon: [name], MD;  Location: [facility];  Service: Gastroenterology;;  . BREAST BIOPSY Right 10/27/2018   affirm bx of mass, x clip,  INVASIVE MAMMARY CARCINOMA  . BREAST LUMPECTOMY Right 12/03/2018  . BRONCHIAL BIOPSY  07/29/2020   Procedure: BRONCHIAL BIOPSIES;  Surgeon: Laurin Coder, MD;  Location: [facility]OSCOPY;  Service: Pulmonary;;  . BRONCHIAL NEEDLE ASPIRATION BIOPSY  07/29/2020   Procedure: BRONCHIAL NEEDLE ASPIRATION BIOPSIES;  Surgeon: Laurin Coder, MD;  Location: [facility] ENDOSCOPY;  Service: Pulmonary;;  . CARDIAC CATHETERIZATION    . ENDOBRONCHIAL ULTRASOUND N/A 07/29/2020   Procedure: ENDOBRONCHIAL ULTRASOUND;  Surgeon: Laurin Coder, MD;  Location: [facility]COPY;  Service: Pulmonary;  Laterality: N/A;  . ESOPHAGOGASTRODUODENOSCOPY Left 07/15/2018   Procedure: ESOPHAGOGASTRODUODENOSCOPY (EGD);  Surgeon: [name], MD;  Location: [facility];  Service: Gastroenterology;  Laterality: Left;  . HEMOSTASIS CONTROL  07/29/2020   Procedure: HEMOSTASIS CONTROL;  Surgeon: Laurin Coder, MD;  Location: [facility];  Service: Pulmonary;;  .  PACEMAKER INSERTION     Medtronic Enpulse dual-chamber pacemaker  . PARTIAL MASTECTOMY WITH NEEDLE LOCALIZATION AND AXILLARY SENTINEL LYMPH NODE BX Right 12/03/2018   Procedure: PARTIAL MASTECTOMY WITH NEEDLE LOCALIZATION AND  AXILLARY SENTINEL LYMPH NODE BX, RIGHT;  Surgeon: [name], MD;  Location: [facility] ORS;  Service: General;  Laterality: Right;  . PERMANENT PACEMAKER GENERATOR CHANGE N/A 07/30/2013   Procedure: PERMANENT PACEMAKER GENERATOR CHANGE;  Surgeon: [name], MD;  Location: [facility] CATH LAB;  Service: Cardiovascular;  Laterality: N/A;  . PORTA CATH INSERTION N/A 08/16/2020   Procedure: PORTA CATH INSERTION;  Surgeon: Katha Cabal, MD;  Location: [facility] CV LAB;  Service: Cardiovascular;  Laterality: N/A;  . VIDEO BRONCHOSCOPY N/A 07/29/2020   Procedure: VIDEO BRONCHOSCOPY WITH FLUORO;  Surgeon: Laurin Coder, MD;  Location: [facility] ENDOSCOPY;  Service: Pulmonary;  Laterality: N/A;    FAMILY HISTORY: Family History  Problem Relation Age of Onset  . Hypertension Mother   . Breast cancer Sister   . Breast cancer Sister   . Colon cancer Neg Hx   . Esophageal cancer Neg Hx     ADVANCED DIRECTIVES (Y/N):  N  HEALTH MAINTENANCE: Social History   Tobacco Use  . Smoking status: Former Smoker    Quit date: 07/25/2006    Years since quitting: 14.0  . Smokeless tobacco: Never Used  Vaping Use  . Vaping Use: Never used  Substance Use Topics  . Alcohol use: No  . Drug use: No     Colonoscopy:  PAP:  Bone density:  Lipid panel:  No Known Allergies  Current Outpatient Medications  Medication Sig Dispense Refill  . acetaminophen (TYLENOL) 500 MG tablet Take 500-1,000 mg by mouth every 6 (six) hours as needed for moderate pain.     [name] alendronate (FOSAMAX) 70 MG tablet TAKE 1 TABLET (70 MG TOTAL) BY MOUTH ONCE A WEEK. TAKE WITH A FULL GLASS OF WATER ON AN EMPTY STOMACH. 12 tablet 2  . allopurinol (ZYLOPRIM) 100 MG tablet Take 100 mg by mouth daily.    [name] amLODipine (NORVASC) 5 MG tablet Take 5 mg by mouth daily.    . benzonatate (TESSALON) 100 MG capsule Take 100-200 mg by mouth 3 (three) times daily as needed for cough.    . Blood Pressure KIT Automated blood pressure  measuring device. 1 each 0  . clopidogrel (PLAVIX) 75 MG tablet Take 75 mg by mouth daily.    . hydrochlorothiazide (HYDRODIURIL) 25 MG tablet Take 25 mg by mouth daily.    [name] letrozole (FEMARA) 2.5 MG tablet TAKE 1 TABLET BY MOUTH EVERY DAY (Patient taking differently: Take 2.5 mg by mouth daily.) 90 tablet 3  . LUMIGAN 0.01 % SOLN Place 1 drop into both eyes at bedtime.  4  . pantoprazole (PROTONIX) 40 MG tablet Take 40 mg by mouth daily.    . Potassium 99 MG TABS Take 99 mg by mouth daily.    . simvastatin (ZOCOR) 40 MG tablet Take 1 tablet (40 mg total) by mouth daily at 6 PM. 90 tablet 3  . lidocaine-prilocaine (EMLA) cream Apply to affected area once 30 g 3  . ondansetron (ZOFRAN) 8 MG tablet Take 1 tablet (8 mg total) by mouth 2 (two) times daily as needed for refractory nausea / vomiting. 60 tablet 2  . prochlorperazine (COMPAZINE) 10 MG tablet Take 1 tablet (10 mg total) by mouth every 6 (six) hours as needed (Nausea or vomiting). 60 tablet 2   No  current facility-administered medications for this visit.    OBJECTIVE: Vitals:   08/17/20 1102  BP: 114/61  Pulse: 77  Resp: 18  Temp: (!) 97 F (36.1 C)  SpO2: 100%     Body mass index is 27.62 kg/m.    ECOG FS:0 - Asymptomatic  General: Well-developed, well-nourished, no acute distress.  Sitting in a wheelchair. Eyes: Pink conjunctiva, anicteric sclera. HEENT: Normocephalic, moist mucous membranes. Lungs: No audible wheezing or coughing. Heart: Regular rate and rhythm. Abdomen: Soft, nontender, no obvious distention. Musculoskeletal: No edema, cyanosis, or clubbing. Neuro: Alert, answering all questions appropriately. Cranial nerves grossly intact. Skin: No rashes or petechiae noted. Psych: Normal affect.   LAB RESULTS:  Lab Results  Component Value Date   NA 140 07/25/2020   K 4.3 07/25/2020   CL 109 07/25/2020   CO2 26 07/25/2020   GLUCOSE 91 07/25/2020   BUN 10 07/25/2020   CREATININE 0.67 07/25/2020   CALCIUM  8.2 (L) 07/25/2020   PROT 6.1 (L) 07/25/2020   ALBUMIN 2.0 (L) 07/25/2020   AST 15 07/25/2020   ALT 8 07/25/2020   ALKPHOS 40 07/25/2020   BILITOT 1.1 07/25/2020   GFRNONAA >60 07/25/2020   GFRAA >60 07/24/2018    Lab Results  Component Value Date   WBC 5.6 07/29/2020   NEUTROABS 4.0 07/25/2020   HGB 9.2 (L) 07/29/2020   HCT 30.3 (L) 07/29/2020   MCV 89.1 07/29/2020   PLT 347 07/29/2020     STUDIES: CT Head W Wo Contrast  Result Date: 08/15/2020 CLINICAL DATA:  Staging non-small cell lung cancer. EXAM: CT HEAD WITHOUT AND WITH CONTRAST TECHNIQUE: Contiguous axial images were obtained from the base of the skull through the vertex without and with intravenous contrast CONTRAST:  53m OMNIPAQUE IOHEXOL 300 MG/ML  SOLN COMPARISON:  Head CT 07/15/2018 and 05/26/2018. FINDINGS: Brain: No focal abnormality affects brainstem. Old infarction of the inferior cerebellum on the left. Cerebral hemispheres show old infarction in the right basal ganglia and radiating white matter tracts and chronic small-vessel change of the hemispheric white matter. No large vessel territory infarction. No mass lesion, hemorrhage, hydrocephalus or extra-axial collection. No other cause of abnormal enhancement is identified. Vascular: There is atherosclerotic calcification of the major vessels at the base of the brain. Skull: Negative Sinuses/Orbits: Clear/normal Other: None IMPRESSION: No evidence of metastatic disease. Old infarction in the right basal ganglia and radiating white matter tracts. Old infarction of the inferior cerebellum on the left. Chronic small-vessel change of the hemispheric white matter. Electronically Signed   By: MNelson ChimesM.D.   On: 08/15/2020 15:09   CT Angio Chest PE W and/or Wo Contrast  Result Date: 07/25/2020 CLINICAL DATA:  Hemoptysis and chest pain EXAM: CT ANGIOGRAPHY CHEST WITH CONTRAST TECHNIQUE: Multidetector CT imaging of the chest was performed using the standard protocol  during bolus administration of intravenous contrast. Multiplanar CT image reconstructions and MIPs were obtained to evaluate the vascular anatomy. CONTRAST:  738mOMNIPAQUE IOHEXOL 350 MG/ML SOLN COMPARISON:  Chest x-ray from the previous day. FINDINGS: Cardiovascular: Mild atherosclerotic calcifications of the aorta are noted. No aneurysmal dilatation is seen. Pacing device is noted. No cardiac enlargement is seen. The pulmonary artery shows a normal branching pattern on the on the left. No filling defects are identified to suggest pulmonary embolism. On the right there is significant mass effect upon the pulmonary arterial branches by a large central hilar mass. No definitive filling defect is identified. Significant narrowing is noted within the  left innominate vein likely related to the prior pacemaker placement. Multiple chest wall and neck collaterals are seen. Mediastinum/Nodes: Thoracic inlet is within normal limits. The esophagus is within normal limits. Considerable right-sided hilar mass is noted with adenopathy and occlusion of the bronchus intermedius with lower lobe consolidation. Associated subcarinal adenopathy is noted as well. Small hilar nodes are noted on left but not significant by size criteria. Lungs/Pleura: Left lung is well aerated without focal infiltrate or sizable effusion. Right lung demonstrates consolidation in the medial aspect of the lower lobe secondary to the central mass lesion. The right hilar component seen on image number 181 of series 7 measures 4.5 x 3.4 cm in greatest dimension. Additionally a larger infrahilar component is noted which measures approximately 5.8 by at least 4.5 cm. Occlusion of the bronchus intermedius is noted in these changes are consistent with pulmonary neoplasm till proven otherwise. Upper Abdomen: Visualized upper abdomen is within normal limits. Musculoskeletal: Degenerative changes of the thoracic spine are noted. Review of the MIP images confirms  the above findings. IMPRESSION: No evidence of pulmonary emboli. Large right hilar and infrahilar mass lesion with compression on the central aspect of the right pulmonary artery with evidence of occlusion of the bronchus intermedius and significant lower lobe consolidation. Associated subcarinal adenopathy is noted as well. Bronchoscopic evaluation is recommended Narrowing in the left innominate vein related to prior pacemaker placement. Multiple chest wall and neck collaterals are noted. Aortic Atherosclerosis (ICD10-I70.0). Electronically Signed   By: Inez Catalina M.D.   On: 07/25/2020 00:59   CT ABDOMEN PELVIS W CONTRAST  Result Date: 07/28/2020 CLINICAL DATA:  Weight loss, unintentional. History right breast cancer in 2020. EXAM: CT ABDOMEN AND PELVIS WITH CONTRAST TECHNIQUE: Multidetector CT imaging of the abdomen and pelvis was performed using the standard protocol following bolus administration of intravenous contrast. CONTRAST:  185m OMNIPAQUE IOHEXOL 300 MG/ML  SOLN COMPARISON:  12/19/2018 FINDINGS: Lower chest: Right lower lobe consolidative opacity with right infrahilar mass, incompletely visualized and better characterized on CTA Chest performed 07/25/2020. Hepatobiliary: No suspicious focal abnormality within the liver parenchyma. Calcified gallstones evident. No intrahepatic or extrahepatic biliary dilation. Pancreas: No focal mass lesion. No dilatation of the main duct. No intraparenchymal cyst. No peripancreatic edema. Spleen: No splenomegaly. No focal mass lesion. Adrenals/Urinary Tract: No adrenal nodule or mass. 2.2 cm exophytic lesion lower pole right kidney shows heterogeneous enhancement. This is not substantially changed since 12/19/2018 in that previous study was performed with and without contrast demonstrating macroscopic fat attenuation on the precontrast imaging. Lesion was characterized as AML at that time. Delayed imaging does show small areas of fat density in the lesion,  consistent with AML. Bilateral renal cysts are again noted including a dominant central sinus cyst in the interpolar left kidney. Tiny nonobstructing stones are evident bilaterally. No evidence for hydroureter. The urinary bladder appears normal for the degree of distention. Stomach/Bowel: Tiny hiatal hernia. Stomach decompressed. Duodenum is normally positioned as is the ligament of Treitz. No small bowel wall thickening. No small bowel dilatation. The terminal ileum is normal. The appendix is normal. No gross colonic mass. No colonic wall thickening. Vascular/Lymphatic: There is abdominal aortic atherosclerosis without aneurysm. There is no gastrohepatic or hepatoduodenal ligament lymphadenopathy. No retroperitoneal or mesenteric lymphadenopathy. No pelvic sidewall lymphadenopathy. Reproductive: Calcified fibroids are noted in the uterus. There is no adnexal mass. Other: No intraperitoneal free fluid. Musculoskeletal: No worrisome lytic or sclerotic osseous abnormality. IMPRESSION: 1. No evidence for metastatic disease in the abdomen or  pelvis. 2. Right lower lobe consolidative opacity with right infrahilar mass, incompletely visualized and better characterized on CTA Chest performed 07/25/2020. 3. 2.2 cm exophytic lesion lower pole right kidney shows heterogeneous enhancement and was characterized as AML on the study from 1.5 years ago. 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 4. Bilateral renal cysts with nonobstructing bilateral renal stones. 5. Cholelithiasis. 6. Tiny hiatal hernia. 7. Aortic Atherosclerosis (ICD10-I70.0). Electronically Signed   By: Misty Stanley M.D.   On: 07/28/2020 08:55   PERIPHERAL VASCULAR CATHETERIZATION  Result Date: 08/16/2020 See Op Note  NM PET Image Initial (PI) Skull Base To Thigh  Result Date: 08/15/2020 CLINICAL DATA:  Initial treatment strategy for lung cancer. EXAM: NUCLEAR MEDICINE PET SKULL BASE TO THIGH TECHNIQUE: 8.6  mCi F-18 FDG was injected intravenously. Full-ring PET imaging was performed from the skull base to thigh after the radiotracer. CT data was obtained and used for attenuation correction and anatomic localization. Fasting blood glucose: 86 mg/dl COMPARISON:  CT abdomen pelvis 07/28/2010, CT chest 07/25/2020. FINDINGS: Mediastinal blood pool activity: SUV max 2.2 Liver activity: SUV max NA NECK: No abnormal hypermetabolism. Incidental CT findings: None. CHEST: Markedly hypermetabolic right hilar nodal mass measures approximately 3.1 cm with an SUV max of 26.0. A central mass in the right hemithorax obstructs the right middle and right lower lobe bronchi, with an SUV max of 31.6. Measurement is difficult without IV contrast and due to associated hypermetabolic postobstructive collapse/consolidation. No hypermetabolic mediastinal or axillary lymph nodes. Incidental CT findings: Atherosclerotic calcification of the aorta, aortic valve and coronary arteries. Heart is enlarged. No pericardial effusion. Separate nodule in the peripheral right lower lobe measures 1.3 cm (4/87) but does not show abnormal hypermetabolism. ABDOMEN/PELVIS: No abnormal hypermetabolism in the liver, adrenal glands, spleen or pancreas. No hypermetabolic lymph nodes. Incidental CT findings: Visualized portion of the liver is unremarkable. Stones are seen in the gallbladder. Adrenal glands are unremarkable. Mixed attenuation mass in the lower pole right kidney contains fat, measuring 2.6 cm. Additional low-attenuation lesions in both kidneys are better seen on 07/27/2020. Stones in the kidneys bilaterally. Spleen, pancreas, stomach and bowel are grossly unremarkable. Atherosclerotic calcification of the aorta. Bladder is low in volume. SKELETON: No abnormal hypermetabolism. Incidental CT findings: Degenerative changes in the spine. IMPRESSION: 1. Hypermetabolic central right lung mass with obstruction of the right middle and right lower lobe bronchi,  associated postobstructive collapse/consolidation and hypermetabolic right hilar adenopathy, findings indicative of at least T2 N1 M0 or stage IB-IIA primary bronchogenic carcinoma. 2. Cholelithiasis. 3. Right renal angiomyolipoma. 4. Bilateral renal stones. 5. Aortic atherosclerosis (ICD10-I70.0). Coronary artery calcification. Electronically Signed   By: Lorin Picket M.D.   On: 08/15/2020 16:25   DG Chest Port 1 View  Result Date: 07/24/2020 CLINICAL DATA:  Fall with multiple episodes of bloody phlegm. EXAM: PORTABLE CHEST 1 VIEW COMPARISON:  Chest radiograph July 13, 2018. FINDINGS: Cardiomegaly. Atherosclerotic tortuous thoracic aorta. Right rib deviation of the trachea, slightly increased from prior. Moderate right pleural effusion with right basilar atelectasis. Left lung is clear. No displaced rib fracture visualized. IMPRESSION: 1. Moderate right pleural effusion with right basilar atelectasis. 2. Cardiomegaly. 3. Atherosclerotic tortuous thoracic aorta with rightward deviation of the trachea, which is in part accentuated by technique but is suggestive of ascending thoracic aortic aneurysm. Consider further evaluation with chest CT with contrast. Electronically Signed   By: Dahlia Bailiff MD   On: 07/24/2020 22:48   [place]  Result Date: 08/17/2020 Scheduled remote reviewed. A burden 13.3%; longest AHR 24:26 minutes EGMs suggestive AT vs AF.  1 VHR event 8 beats max v rate 274bpm.  Histogram controlled.  Normal device function.  Forwarding to triage for further review.  R. Powers, CVRS Next remote 91 days.   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  PLAN:    1.  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  CT scan results from July 25, 2020 reviewed independently and reported as above.  Patient  subsequently underwent bronchoscopy on July 29, 2020 confirming the diagnosis.  PET scan results from August 15, 2020 reviewed independently and reported as above confirming stage of disease.  CT of the head did not reveal any metastatic lesions.  Plan to give weekly carboplatinum and Taxol with concurrent XRT followed by maintenance immunotherapy for 1 year.  Patient has had port placement.  Return to clinic on Aug 30, 2020 to initiate cycle 1 of carboplatinum and Taxol.   2.  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  Given her advanced age and small size of malignancy, Oncotype DX or adjuvant chemotherapy was not necessary.  She was also evaluated by radiation oncology who determined that adjuvant XRT was also not needed.  Continue letrozole for a total of 5 years completing treatment in August 2025.  Although we will likely discontinue treatment while she is receiving chemotherapy for her lung cancer.  Her most recent mammogram on November 13, 2019 was reported as BI-RADS 2, repeat in July 2022.    2.  Osteoporosis: Patient's most recent bone mineral density on January 14, 2020 reported T score of -2.6 which is mildly improved over 1 year prior where her T score was reported at -2.8.  She is tolerating letrozole well so we will continue this medication as prescribed.  Continue Fosamax, calcium, and vitamin D supplementation.  Repeat bone mineral density in September 2022.   3.  Anemia: Patient's most recent hemoglobin was reported at 9.2.  Monitor. 4.  Hemoptysis: Resolved.  I spent a total of 30 minutes reviewing chart data, face-to-face evaluation with the patient, counseling and coordination of care as detailed above.    Patient expressed understanding and was in agreement with this plan. She also understands that She can call clinic at any time with any questions, concerns,  or complaints.   Cancer Staging Malignant neoplasm of upper-outer quadrant of right breast in female, estrogen receptor positive ([place]) Staging form: Breast, AJCC 8th Edition - Clinical stage from 11/06/2018: Stage IA (cT1a, cN0, cM0, G1, ER+, PR+, HER2-) - Signed by [name], MD on 11/06/2018 Histologic grading system: 3 grade system  Squamous cell carcinoma of right lung ([place]) Staging form: Lung, AJCC 8th Edition - Pathologic stage from 08/05/2020: Stage IIIA (pT3, pN1, cM0) - Signed by [name], MD on 08/05/2020 Stage prefix: Initial diagnosis   [name], MD   08/17/2020 8:25 PM

## 2020-08-15 ENCOUNTER — Other Ambulatory Visit
Admission: RE | Admit: 2020-08-15 | Discharge: 2020-08-15 | Disposition: A | Discharge status: Home / Self Care | Payer: Medicare HMO | Source: Ambulatory Visit | Attending: Vascular Surgery | Admitting: Vascular Surgery

## 2020-08-15 ENCOUNTER — Ambulatory Visit (INDEPENDENT_AMBULATORY_CARE_PROVIDER_SITE_OTHER): Payer: Medicare HMO

## 2020-08-15 ENCOUNTER — Other Ambulatory Visit: Payer: Self-pay

## 2020-08-15 ENCOUNTER — Ambulatory Visit
Admission: RE | Admit: 2020-08-15 | Discharge: 2020-08-15 | Disposition: A | Discharge status: Home / Self Care | Payer: Medicare HMO | Source: Ambulatory Visit | Attending: Oncology | Admitting: Oncology

## 2020-08-15 DIAGNOSIS — C3490 Malignant neoplasm of unspecified part of unspecified bronchus or lung: Secondary | ICD-10-CM

## 2020-08-15 DIAGNOSIS — R590 Localized enlarged lymph nodes: Secondary | ICD-10-CM | POA: Insufficient documentation

## 2020-08-15 DIAGNOSIS — Z01812 Encounter for preprocedural laboratory examination: Secondary | ICD-10-CM | POA: Diagnosis present

## 2020-08-15 DIAGNOSIS — K8020 Calculus of gallbladder without cholecystitis without obstruction: Secondary | ICD-10-CM | POA: Insufficient documentation

## 2020-08-15 DIAGNOSIS — D1771 Benign lipomatous neoplasm of kidney: Secondary | ICD-10-CM | POA: Diagnosis not present

## 2020-08-15 DIAGNOSIS — I2510 Atherosclerotic heart disease of native coronary artery without angina pectoris: Secondary | ICD-10-CM | POA: Insufficient documentation

## 2020-08-15 DIAGNOSIS — Z8673 Personal history of transient ischemic attack (TIA), and cerebral infarction without residual deficits: Secondary | ICD-10-CM | POA: Diagnosis not present

## 2020-08-15 DIAGNOSIS — I700 Atherosclerosis of aorta: Secondary | ICD-10-CM | POA: Diagnosis not present

## 2020-08-15 DIAGNOSIS — I517 Cardiomegaly: Secondary | ICD-10-CM | POA: Diagnosis not present

## 2020-08-15 DIAGNOSIS — Z20822 Contact with and (suspected) exposure to covid-19: Secondary | ICD-10-CM | POA: Insufficient documentation

## 2020-08-15 DIAGNOSIS — N200 Calculus of kidney: Secondary | ICD-10-CM | POA: Diagnosis not present

## 2020-08-15 DIAGNOSIS — I442 Atrioventricular block, complete: Secondary | ICD-10-CM | POA: Diagnosis not present

## 2020-08-15 LAB — GLUCOSE, CAPILLARY: Glucose-Capillary: 86 mg/dL (ref 70–99)

## 2020-08-15 MED ORDER — IOHEXOL 300 MG/ML  SOLN
75.0000 mL | Freq: Once | INTRAMUSCULAR | Status: AC | PRN
  Administered 2020-08-15: 75 mL via INTRAVENOUS

## 2020-08-15 MED ORDER — FLUDEOXYGLUCOSE F - 18 (FDG) INJECTION
8.0000 | Freq: Once | INTRAVENOUS | Status: AC | PRN
  Administered 2020-08-15: 8.6 via INTRAVENOUS

## 2020-08-16 ENCOUNTER — Ambulatory Visit
Admission: RE | Admit: 2020-08-16 | Discharge: 2020-08-16 | Disposition: A | Discharge status: Home / Self Care | Payer: Medicare HMO | Attending: Vascular Surgery | Admitting: Vascular Surgery

## 2020-08-16 ENCOUNTER — Encounter
Admission: RE | Disposition: A | Discharge status: Home / Self Care | Payer: Self-pay | Source: Home / Self Care | Attending: Vascular Surgery

## 2020-08-16 ENCOUNTER — Encounter: Payer: Self-pay | Admitting: Vascular Surgery

## 2020-08-16 DIAGNOSIS — Z170 Estrogen receptor positive status [ER+]: Secondary | ICD-10-CM | POA: Diagnosis not present

## 2020-08-16 DIAGNOSIS — C3491 Malignant neoplasm of unspecified part of right bronchus or lung: Secondary | ICD-10-CM | POA: Diagnosis not present

## 2020-08-16 DIAGNOSIS — Z87891 Personal history of nicotine dependence: Secondary | ICD-10-CM | POA: Insufficient documentation

## 2020-08-16 DIAGNOSIS — D649 Anemia, unspecified: Secondary | ICD-10-CM | POA: Insufficient documentation

## 2020-08-16 DIAGNOSIS — Z7982 Long term (current) use of aspirin: Secondary | ICD-10-CM | POA: Diagnosis not present

## 2020-08-16 DIAGNOSIS — M810 Age-related osteoporosis without current pathological fracture: Secondary | ICD-10-CM | POA: Insufficient documentation

## 2020-08-16 DIAGNOSIS — Z79899 Other long term (current) drug therapy: Secondary | ICD-10-CM | POA: Insufficient documentation

## 2020-08-16 DIAGNOSIS — C3490 Malignant neoplasm of unspecified part of unspecified bronchus or lung: Secondary | ICD-10-CM

## 2020-08-16 HISTORY — DX: Malignant neoplasm of unspecified part of unspecified bronchus or lung: C34.90

## 2020-08-16 HISTORY — DX: Dyspnea, unspecified: R06.00

## 2020-08-16 HISTORY — PX: PORTA CATH INSERTION: CATH118285

## 2020-08-16 LAB — SARS CORONAVIRUS 2 (TAT 6-24 HRS): SARS Coronavirus 2: NEGATIVE

## 2020-08-16 SURGERY — PORTA CATH INSERTION
Anesthesia: Moderate Sedation

## 2020-08-16 MED ORDER — SODIUM CHLORIDE 0.9 % IV SOLN: INTRAVENOUS | Status: DC

## 2020-08-16 MED ORDER — ONDANSETRON HCL 4 MG/2ML IJ SOLN: 4.0000 mg | Freq: Four times a day (QID) | INTRAMUSCULAR | Status: DC | PRN

## 2020-08-16 MED ORDER — MIDAZOLAM HCL 2 MG/2ML IJ SOLN
INTRAMUSCULAR | Status: DC | PRN
  Administered 2020-08-16 (×3): 1 mg via INTRAVENOUS

## 2020-08-16 MED ORDER — CEFAZOLIN SODIUM-DEXTROSE 2-4 GM/100ML-% IV SOLN
INTRAVENOUS | Status: AC
  Filled 2020-08-16: qty 100

## 2020-08-16 MED ORDER — FENTANYL CITRATE (PF) 100 MCG/2ML IJ SOLN
INTRAMUSCULAR | Status: AC
  Filled 2020-08-16: qty 2

## 2020-08-16 MED ORDER — MIDAZOLAM HCL 2 MG/ML PO SYRP: 8.0000 mg | ORAL_SOLUTION | Freq: Once | ORAL | Status: DC | PRN

## 2020-08-16 MED ORDER — HYDROMORPHONE HCL 1 MG/ML IJ SOLN: 1.0000 mg | Freq: Once | INTRAMUSCULAR | Status: DC | PRN

## 2020-08-16 MED ORDER — CEFAZOLIN SODIUM-DEXTROSE 2-4 GM/100ML-% IV SOLN
2.0000 g | Freq: Once | INTRAVENOUS | Status: AC
  Administered 2020-08-16: 2 g via INTRAVENOUS
  Filled 2020-08-16: qty 100

## 2020-08-16 MED ORDER — DIPHENHYDRAMINE HCL 50 MG/ML IJ SOLN: 50.0000 mg | Freq: Once | INTRAMUSCULAR | Status: DC | PRN

## 2020-08-16 MED ORDER — FENTANYL CITRATE (PF) 100 MCG/2ML IJ SOLN
INTRAMUSCULAR | Status: DC | PRN
  Administered 2020-08-16: 25 ug via INTRAVENOUS
  Administered 2020-08-16: 50 ug via INTRAVENOUS
  Administered 2020-08-16: 25 ug via INTRAVENOUS

## 2020-08-16 MED ORDER — FAMOTIDINE 20 MG PO TABS: 40.0000 mg | ORAL_TABLET | Freq: Once | ORAL | Status: DC | PRN

## 2020-08-16 MED ORDER — MIDAZOLAM HCL 5 MG/5ML IJ SOLN
INTRAMUSCULAR | Status: AC
  Filled 2020-08-16: qty 5

## 2020-08-16 MED ORDER — METHYLPREDNISOLONE SODIUM SUCC 125 MG IJ SOLR: 125.0000 mg | Freq: Once | INTRAMUSCULAR | Status: DC | PRN

## 2020-08-16 MED ORDER — CHLORHEXIDINE GLUCONATE CLOTH 2 % EX PADS
6.0000 | MEDICATED_PAD | Freq: Every day | CUTANEOUS | Status: DC
  Administered 2020-08-16: 2 via TOPICAL

## 2020-08-16 SURGICAL SUPPLY — 14 items
ADH SKN CLS APL DERMABOND .7 (GAUZE/BANDAGES/DRESSINGS) ×1
CANNULA 5F STIFF (CANNULA) ×2 IMPLANT
COVER PROBE U/S 5X48 (MISCELLANEOUS) ×2 IMPLANT
COVER SURGICAL LIGHT HANDLE (MISCELLANEOUS) ×2 IMPLANT
DERMABOND ADVANCED (GAUZE/BANDAGES/DRESSINGS) ×1
DERMABOND ADVANCED .7 DNX12 (GAUZE/BANDAGES/DRESSINGS) ×1 IMPLANT
DRAPE INCISE IOBAN 66X45 STRL (DRAPES) ×2 IMPLANT
KIT PORT POWER 8FR ISP CVUE (Port) ×2 IMPLANT
PACK ANGIOGRAPHY (CUSTOM PROCEDURE TRAY) ×2 IMPLANT
SPONGE XRAY 4X4 16PLY STRL (MISCELLANEOUS) ×2 IMPLANT
SUT MNCRL AB 4-0 PS2 18 (SUTURE) ×2 IMPLANT
SUT PROLENE 0 CT 1 30 (SUTURE) ×2 IMPLANT
SUT VIC AB 3-0 SH 27 (SUTURE) ×2
SUT VIC AB 3-0 SH 27X BRD (SUTURE) ×1 IMPLANT

## 2020-08-16 NOTE — Discharge Instructions (Signed)
Implanted Port Insertion, Care After This sheet gives you information about how to care for yourself after your procedure. Your health care provider may also give you more specific instructions. If you have problems or questions, contact your health care provider. What can I expect after the procedure? After the procedure, it is common to have:  Discomfort at the port insertion site.  Bruising on the skin over the port. This should improve over 3-4 days. Follow these instructions at home: Port care  After your port is placed, you will get a manufacturer's information card. The card has information about your port. Keep this card with you at all times.  Take care of the port as told by your health care provider. Ask your health care provider if you or a family member can get training for taking care of the port at home. A home health care nurse may also take care of the port.  Make sure to remember what type of port you have. Incision care  Follow instructions from your health care provider about how to take care of your port insertion site. Make sure you: ? Wash your hands with soap and water before and after you change your bandage (dressing). If soap and water are not available, use hand sanitizer. ? Change your dressing as told by your health care provider. ? Leave stitches (sutures), skin glue, or adhesive strips in place. These skin closures may need to stay in place for 2 weeks or longer. If adhesive strip edges start to loosen and curl up, you may trim the loose edges. Do not remove adhesive strips completely unless your health care provider tells you to do that.  Check your port insertion site every day for signs of infection. Check for: ? Redness, swelling, or pain. ? Fluid or blood. ? Warmth. ? Pus or a bad smell.      Activity  Return to your normal activities as told by your health care provider. Ask your health care provider what activities are safe for you.  Do not  lift anything that is heavier than 10 lb (4.5 kg), or the limit that you are told, until your health care provider says that it is safe. General instructions  Take over-the-counter and prescription medicines only as told by your health care provider.  Do not take baths, swim, or use a hot tub until your health care provider approves. Ask your health care provider if you may take showers. You may only be allowed to take sponge baths.  Do not drive for 24 hours if you were given a sedative during your procedure.  Wear a medical alert bracelet in case of an emergency. This will tell any health care providers that you have a port.  Keep all follow-up visits as told by your health care provider. This is important. Contact a health care provider if:  You cannot flush your port with saline as directed, or you cannot draw blood from the port.  You have a fever or chills.  You have redness, swelling, or pain around your port insertion site.  You have fluid or blood coming from your port insertion site.  Your port insertion site feels warm to the touch.  You have pus or a bad smell coming from the port insertion site. Get help right away if:  You have chest pain or shortness of breath.  You have bleeding from your port that you cannot control. Summary  Take care of the port as told by your   health care provider. Keep the manufacturer's information card with you at all times.  Change your dressing as told by your health care provider.  Contact a health care provider if you have a fever or chills or if you have redness, swelling, or pain around your port insertion site.  Keep all follow-up visits as told by your health care provider. This information is not intended to replace advice given to you by your health care provider. Make sure you discuss any questions you have with your health care provider. Document Revised: 11/12/2017 Document Reviewed: 11/12/2017 Elsevier Patient Education   2021 Elsevier Inc.  

## 2020-08-16 NOTE — H&P (Signed)
@LOGO @   MRN : [id number]  [name] is a 87 y.o. (1933/09/07) female who presents with chief complaint of No chief complaint on file. [name]  History of Present Illness:   Patient presents to [facility] today for placement of port.  She recently was found to have a right lung mass.  Bronchoscopy and biopsy were performed which demonstrated lung carcinoma.  She therefore will require parenteral chemotherapy and is undergoing placement of a port for appropriate venous access.  She denies fever chills.  Current Meds  Medication Sig  . acetaminophen (TYLENOL) 500 MG tablet Take 500-1,000 mg by mouth every 6 (six) hours as needed for moderate pain.   [name] alendronate (FOSAMAX) 70 MG tablet TAKE 1 TABLET (70 MG TOTAL) BY MOUTH ONCE A WEEK. TAKE WITH A FULL GLASS OF WATER ON AN EMPTY STOMACH.  [name] allopurinol (ZYLOPRIM) 100 MG tablet Take 100 mg by mouth daily.  [name] amLODipine (NORVASC) 5 MG tablet Take 5 mg by mouth daily.  [name] aspirin EC 81 MG tablet Take 81 mg by mouth daily. Swallow whole.  . benzonatate (TESSALON) 100 MG capsule Take 100-200 mg by mouth 3 (three) times daily as needed for cough.  . clopidogrel (PLAVIX) 75 MG tablet Take 75 mg by mouth daily.  . hydrochlorothiazide (HYDRODIURIL) 25 MG tablet Take 25 mg by mouth daily.  [name] letrozole (FEMARA) 2.5 MG tablet TAKE 1 TABLET BY MOUTH EVERY DAY (Patient taking differently: Take 2.5 mg by mouth daily.)  . LUMIGAN 0.01 % SOLN Place 1 drop into both eyes at bedtime.  . pantoprazole (PROTONIX) 40 MG tablet Take 40 mg by mouth daily.  . Potassium 99 MG TABS Take 99 mg by mouth daily.  . simvastatin (ZOCOR) 40 MG tablet Take 1 tablet (40 mg total) by mouth daily at 6 PM.    Past Medical History:  Diagnosis Date  . Breast cancer, right ([place]) 11/2018   Mastectomy  . Complete heart block ([place])   . Dyspnea   . Gait abnormality 07/23/2016  . GERD (gastroesophageal reflux disease)   . Hemoptysis 07/25/2020  . History  of hypertension   . History of stroke   . Hyperhomocysteinemia ([place])   . Hyperlipidemia   . Hypertension   . Hypokalemia 07/13/2018  . Low blood magnesium 07/13/2018  . Lung cancer ([place])   . Pacemaker-Medtronic 08/07/2011  . Stroke [facility])    left sided weakness, 07/28/20- no residual   . Vomiting 07/13/2018    Past Surgical History:  Procedure Laterality Date  . BIOPSY  07/15/2018   Procedure: BIOPSY;  Surgeon: [name], MD;  Location: [facility];  Service: Gastroenterology;;  . BREAST BIOPSY Right 10/27/2018   affirm bx of mass, x clip,  INVASIVE MAMMARY CARCINOMA  . BREAST LUMPECTOMY Right 12/03/2018  . BRONCHIAL BIOPSY  07/29/2020   Procedure: BRONCHIAL BIOPSIES;  Surgeon: Laurin Coder, MD;  Location: [place] ENDOSCOPY;  Service: Pulmonary;;  . BRONCHIAL NEEDLE ASPIRATION BIOPSY  07/29/2020   Procedure: BRONCHIAL NEEDLE ASPIRATION BIOPSIES;  Surgeon: Laurin Coder, MD;  Location: [facility] ENDOSCOPY;  Service: Pulmonary;;  . CARDIAC CATHETERIZATION    . ENDOBRONCHIAL ULTRASOUND N/A 07/29/2020   Procedure: ENDOBRONCHIAL ULTRASOUND;  Surgeon: Laurin Coder, MD;  Location: [facility]COPY;  Service: Pulmonary;  Laterality: N/A;  . ESOPHAGOGASTRODUODENOSCOPY Left 07/15/2018   Procedure: ESOPHAGOGASTRODUODENOSCOPY (EGD);  Surgeon: [name], MD;  Location: [facility];  Service: Gastroenterology;  Laterality: Left;  . HEMOSTASIS CONTROL  07/29/2020  Procedure: HEMOSTASIS CONTROL;  Surgeon: Laurin Coder, MD;  Location: [facility] ENDOSCOPY;  Service: Pulmonary;;  . PACEMAKER INSERTION     Medtronic Enpulse dual-chamber pacemaker  . PARTIAL MASTECTOMY WITH NEEDLE LOCALIZATION AND AXILLARY SENTINEL LYMPH NODE BX Right 12/03/2018   Procedure: PARTIAL MASTECTOMY WITH NEEDLE LOCALIZATION AND AXILLARY SENTINEL LYMPH NODE BX, RIGHT;  Surgeon: [name], MD;  Location: [facility] ORS;  Service: General;  Laterality: Right;  . PERMANENT PACEMAKER GENERATOR CHANGE N/A 07/30/2013   Procedure:  PERMANENT PACEMAKER GENERATOR CHANGE;  Surgeon: [name], MD;  Location: [facility] CATH LAB;  Service: Cardiovascular;  Laterality: N/A;  . VIDEO BRONCHOSCOPY N/A 07/29/2020   Procedure: VIDEO BRONCHOSCOPY WITH FLUORO;  Surgeon: Laurin Coder, MD;  Location: [facility] ENDOSCOPY;  Service: Pulmonary;  Laterality: N/A;    Social History Social History   Tobacco Use  . Smoking status: Former Smoker    Quit date: 07/25/2006    Years since quitting: 14.0  . Smokeless tobacco: Never Used  Vaping Use  . Vaping Use: Never used  Substance Use Topics  . Alcohol use: No  . Drug use: No    Family History Family History  Problem Relation Age of Onset  . Hypertension Mother   . Breast cancer Sister   . Breast cancer Sister   . Colon cancer Neg Hx   . Esophageal cancer Neg Hx     No Known Allergies   REVIEW OF SYSTEMS (Negative unless checked)  Constitutional: [] Weight loss  [] Fever  [] Chills Cardiac: [] Chest pain   [] Chest pressure   [] Palpitations   [] Shortness of breath when laying flat   [] Shortness of breath with exertion. Vascular:  [] Pain in legs with walking   [] Pain in legs at rest  [] History of DVT   [] Phlebitis   [] Swelling in legs   [] Varicose veins   [] Non-healing ulcers Pulmonary:   [] Uses home oxygen   [] Productive cough   [] Hemoptysis   [] Wheeze  [] COPD   [] Asthma Neurologic:  [] Dizziness   [] Seizures   [] History of stroke   [] History of TIA  [] Aphasia   [] Vissual changes   [] Weakness or numbness in arm   [] Weakness or numbness in leg Musculoskeletal:   [] Joint swelling   [] Joint pain   [] Low back pain Hematologic:  [] Easy bruising  [] Easy bleeding   [] Hypercoagulable state   [] Anemic Gastrointestinal:  [] Diarrhea   [] Vomiting  [] Gastroesophageal reflux/heartburn   [] Difficulty swallowing. Genitourinary:  [] Chronic kidney disease   [] Difficult urination  [] Frequent urination   [] Blood in urine Skin:  [] Rashes   [] Ulcers  Psychological:  [] History of anxiety   []  History of  major depression.  Physical Examination  Vitals:   08/16/20 0722  BP: (!) 155/80  Pulse: 72  Resp: (!) 91  Temp: 98.4 F (36.9 C)  TempSrc: Oral  SpO2: 98%  Weight: 70.3 kg  Height: 5' (1.524 m)   Body mass index is 30.27 kg/m. Gen: WD/WN, NAD Head: [place]/AT, No temporalis wasting.  Ear/Nose/Throat: Hearing grossly intact, nares w/o erythema or drainage Eyes: PER, EOMI, sclera nonicteric.  Neck: Supple, no large masses.   Pulmonary:  Good air movement, no audible wheezing bilaterally, no use of accessory muscles.  Cardiac: RRR, no JVD Vascular: Vessel Right Left  Radial Palpable Palpable  Gastrointestinal: Non-distended. No guarding/no peritoneal signs.  Musculoskeletal: M/S 5/5 throughout.  No deformity or atrophy.  Neurologic: CN 2-12 intact. Symmetrical.  Speech is fluent. Motor exam as listed above. Psychiatric: Judgment intact, Mood & affect appropriate  for pt's clinical situation. Dermatologic: No rashes or ulcers noted.  No changes consistent with cellulitis.   CBC Lab Results  Component Value Date   WBC 5.6 07/29/2020   HGB 9.2 (L) 07/29/2020   HCT 30.3 (L) 07/29/2020   MCV 89.1 07/29/2020   PLT 347 07/29/2020    BMET    Component Value Date/Time   NA 140 07/25/2020 0422   K 4.3 07/25/2020 0422   CL 109 07/25/2020 0422   CO2 26 07/25/2020 0422   GLUCOSE 91 07/25/2020 0422   BUN 10 07/25/2020 0422   CREATININE 0.67 07/25/2020 0422   CALCIUM 8.2 (L) 07/25/2020 0422   GFRNONAA >60 07/25/2020 0422   GFRAA >60 07/24/2018 0355   CrCl cannot be calculated (Patient's most recent lab result is older than the maximum 21 days allowed.).  COAG Lab Results  Component Value Date   INR 1.1 07/24/2020   INR 1.2 07/14/2018   INR 1.27 05/26/2018    Radiology CT Head W Wo Contrast  Result Date: 08/15/2020 CLINICAL DATA:  Staging non-small cell lung cancer. EXAM: CT HEAD WITHOUT AND WITH CONTRAST TECHNIQUE: Contiguous axial images were obtained from the base  of the skull through the vertex without and with intravenous contrast CONTRAST:  67m OMNIPAQUE IOHEXOL 300 MG/ML  SOLN COMPARISON:  Head CT 07/15/2018 and 05/26/2018. FINDINGS: Brain: No focal abnormality affects brainstem. Old infarction of the inferior cerebellum on the left. Cerebral hemispheres show old infarction in the right basal ganglia and radiating white matter tracts and chronic small-vessel change of the hemispheric white matter. No large vessel territory infarction. No mass lesion, hemorrhage, hydrocephalus or extra-axial collection. No other cause of abnormal enhancement is identified. Vascular: There is atherosclerotic calcification of the major vessels at the base of the brain. Skull: Negative Sinuses/Orbits: Clear/normal Other: None IMPRESSION: No evidence of metastatic disease. Old infarction in the right basal ganglia and radiating white matter tracts. Old infarction of the inferior cerebellum on the left. Chronic small-vessel change of the hemispheric white matter. Electronically Signed   By: MNelson ChimesM.D.   On: 08/15/2020 15:09   CT Angio Chest PE W and/or Wo Contrast  Result Date: 07/25/2020 CLINICAL DATA:  Hemoptysis and chest pain EXAM: CT ANGIOGRAPHY CHEST WITH CONTRAST TECHNIQUE: Multidetector CT imaging of the chest was performed using the standard protocol during bolus administration of intravenous contrast. Multiplanar CT image reconstructions and MIPs were obtained to evaluate the vascular anatomy. CONTRAST:  775mOMNIPAQUE IOHEXOL 350 MG/ML SOLN COMPARISON:  Chest x-ray from the previous day. FINDINGS: Cardiovascular: Mild atherosclerotic calcifications of the aorta are noted. No aneurysmal dilatation is seen. Pacing device is noted. No cardiac enlargement is seen. The pulmonary artery shows a normal branching pattern on the on the left. No filling defects are identified to suggest pulmonary embolism. On the right there is significant mass effect upon the pulmonary arterial  branches by a large central hilar mass. No definitive filling defect is identified. Significant narrowing is noted within the left innominate vein likely related to the prior pacemaker placement. Multiple chest wall and neck collaterals are seen. Mediastinum/Nodes: Thoracic inlet is within normal limits. The esophagus is within normal limits. Considerable right-sided hilar mass is noted with adenopathy and occlusion of the bronchus intermedius with lower lobe consolidation. Associated subcarinal adenopathy is noted as well. Small hilar nodes are noted on left but not significant by size criteria. Lungs/Pleura: Left lung is well aerated without focal infiltrate or sizable effusion. Right lung demonstrates consolidation in  the medial aspect of the lower lobe secondary to the central mass lesion. The right hilar component seen on image number 181 of series 7 measures 4.5 x 3.4 cm in greatest dimension. Additionally a larger infrahilar component is noted which measures approximately 5.8 by at least 4.5 cm. Occlusion of the bronchus intermedius is noted in these changes are consistent with pulmonary neoplasm till proven otherwise. Upper Abdomen: Visualized upper abdomen is within normal limits. Musculoskeletal: Degenerative changes of the thoracic spine are noted. Review of the MIP images confirms the above findings. IMPRESSION: No evidence of pulmonary emboli. Large right hilar and infrahilar mass lesion with compression on the central aspect of the right pulmonary artery with evidence of occlusion of the bronchus intermedius and significant lower lobe consolidation. Associated subcarinal adenopathy is noted as well. Bronchoscopic evaluation is recommended Narrowing in the left innominate vein related to prior pacemaker placement. Multiple chest wall and neck collaterals are noted. Aortic Atherosclerosis (ICD10-I70.0). Electronically Signed   By: Inez Catalina M.D.   On: 07/25/2020 00:59   CT ABDOMEN PELVIS W  CONTRAST  Result Date: 07/28/2020 CLINICAL DATA:  Weight loss, unintentional. History right breast cancer in 2020. EXAM: CT ABDOMEN AND PELVIS WITH CONTRAST TECHNIQUE: Multidetector CT imaging of the abdomen and pelvis was performed using the standard protocol following bolus administration of intravenous contrast. CONTRAST:  168m OMNIPAQUE IOHEXOL 300 MG/ML  SOLN COMPARISON:  12/19/2018 FINDINGS: Lower chest: Right lower lobe consolidative opacity with right infrahilar mass, incompletely visualized and better characterized on CTA Chest performed 07/25/2020. Hepatobiliary: No suspicious focal abnormality within the liver parenchyma. Calcified gallstones evident. No intrahepatic or extrahepatic biliary dilation. Pancreas: No focal mass lesion. No dilatation of the main duct. No intraparenchymal cyst. No peripancreatic edema. Spleen: No splenomegaly. No focal mass lesion. Adrenals/Urinary Tract: No adrenal nodule or mass. 2.2 cm exophytic lesion lower pole right kidney shows heterogeneous enhancement. This is not substantially changed since 12/19/2018 in that previous study was performed with and without contrast demonstrating macroscopic fat attenuation on the precontrast imaging. Lesion was characterized as AML at that time. Delayed imaging does show small areas of fat density in the lesion, consistent with AML. Bilateral renal cysts are again noted including a dominant central sinus cyst in the interpolar left kidney. Tiny nonobstructing stones are evident bilaterally. No evidence for hydroureter. The urinary bladder appears normal for the degree of distention. Stomach/Bowel: Tiny hiatal hernia. Stomach decompressed. Duodenum is normally positioned as is the ligament of Treitz. No small bowel wall thickening. No small bowel dilatation. The terminal ileum is normal. The appendix is normal. No gross colonic mass. No colonic wall thickening. Vascular/Lymphatic: There is abdominal aortic atherosclerosis without  aneurysm. There is no gastrohepatic or hepatoduodenal ligament lymphadenopathy. No retroperitoneal or mesenteric lymphadenopathy. No pelvic sidewall lymphadenopathy. Reproductive: Calcified fibroids are noted in the uterus. There is no adnexal mass. Other: No intraperitoneal free fluid. Musculoskeletal: No worrisome lytic or sclerotic osseous abnormality. IMPRESSION: 1. No evidence for metastatic disease in the abdomen or pelvis. 2. Right lower lobe consolidative opacity with right infrahilar mass, incompletely visualized and better characterized on CTA Chest performed 07/25/2020. 3. 2.2 cm exophytic lesion lower pole right kidney shows heterogeneous enhancement and was characterized as AML on the study from 1.5 years ago. 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 4. Bilateral renal cysts with nonobstructing bilateral renal stones. 5. Cholelithiasis. 6. Tiny hiatal hernia. 7. Aortic Atherosclerosis (ICD10-I70.0). Electronically Signed   By: EVerda CuminsD.  On: 07/28/2020 08:55   NM PET Image Initial (PI) Skull Base To Thigh  Result Date: 08/15/2020 CLINICAL DATA:  Initial treatment strategy for lung cancer. EXAM: NUCLEAR MEDICINE PET SKULL BASE TO THIGH TECHNIQUE: 8.6 mCi F-18 FDG was injected intravenously. Full-ring PET imaging was performed from the skull base to thigh after the radiotracer. CT data was obtained and used for attenuation correction and anatomic localization. Fasting blood glucose: 86 mg/dl COMPARISON:  CT abdomen pelvis 07/28/2010, CT chest 07/25/2020. FINDINGS: Mediastinal blood pool activity: SUV max 2.2 Liver activity: SUV max NA NECK: No abnormal hypermetabolism. Incidental CT findings: None. CHEST: Markedly hypermetabolic right hilar nodal mass measures approximately 3.1 cm with an SUV max of 26.0. A central mass in the right hemithorax obstructs the right middle and right lower lobe bronchi, with an SUV max of 31.6. Measurement is  difficult without IV contrast and due to associated hypermetabolic postobstructive collapse/consolidation. No hypermetabolic mediastinal or axillary lymph nodes. Incidental CT findings: Atherosclerotic calcification of the aorta, aortic valve and coronary arteries. Heart is enlarged. No pericardial effusion. Separate nodule in the peripheral right lower lobe measures 1.3 cm (4/87) but does not show abnormal hypermetabolism. ABDOMEN/PELVIS: No abnormal hypermetabolism in the liver, adrenal glands, spleen or pancreas. No hypermetabolic lymph nodes. Incidental CT findings: Visualized portion of the liver is unremarkable. Stones are seen in the gallbladder. Adrenal glands are unremarkable. Mixed attenuation mass in the lower pole right kidney contains fat, measuring 2.6 cm. Additional low-attenuation lesions in both kidneys are better seen on 07/27/2020. Stones in the kidneys bilaterally. Spleen, pancreas, stomach and bowel are grossly unremarkable. Atherosclerotic calcification of the aorta. Bladder is low in volume. SKELETON: No abnormal hypermetabolism. Incidental CT findings: Degenerative changes in the spine. IMPRESSION: 1. Hypermetabolic central right lung mass with obstruction of the right middle and right lower lobe bronchi, associated postobstructive collapse/consolidation and hypermetabolic right hilar adenopathy, findings indicative of at least T2 N1 M0 or stage IB-IIA primary bronchogenic carcinoma. 2. Cholelithiasis. 3. Right renal angiomyolipoma. 4. Bilateral renal stones. 5. Aortic atherosclerosis (ICD10-I70.0). Coronary artery calcification. Electronically Signed   By: Lorin Picket M.D.   On: 08/15/2020 16:25   DG Chest Port 1 View  Result Date: 07/24/2020 CLINICAL DATA:  Fall with multiple episodes of bloody phlegm. EXAM: PORTABLE CHEST 1 VIEW COMPARISON:  Chest radiograph July 13, 2018. FINDINGS: Cardiomegaly. Atherosclerotic tortuous thoracic aorta. Right rib deviation of the trachea, slightly  increased from prior. Moderate right pleural effusion with right basilar atelectasis. Left lung is clear. No displaced rib fracture visualized. IMPRESSION: 1. Moderate right pleural effusion with right basilar atelectasis. 2. Cardiomegaly. 3. Atherosclerotic tortuous thoracic aorta with rightward deviation of the trachea, which is in part accentuated by technique but is suggestive of ascending thoracic aortic aneurysm. Consider further evaluation with chest CT with contrast. Electronically Signed   By: Dahlia Bailiff MD   On: 07/24/2020 22:48    Assessment/Plan 1.  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    Patient will require chemotherapy and therefore appropriate intravenous access.  Infuse-a-Port will be placed.  Risks and benefits were reviewed.  Patient and her power of attorney agree.  2.  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  Given her advanced age and small size of malignancy, Oncotype DX or adjuvant chemotherapy was not necessary.  She was also evaluated by radiation oncology who determined that adjuvant  XRT was also not needed.  Continue letrozole for a total of 5 years completing treatment in August 2025.  Although we will likely discontinue treatment while she is receiving chemotherapy for her lung cancer.  Her most recent mammogram on November 13, 2019 was reported as BI-RADS 2, repeat in July 2022.     3.  Osteoporosis: Patient's most recent bone mineral density on January 14, 2020 reported T score of -2.6 which is mildly improved over 1 year prior where her T score was reported at -2.8.  She is tolerating letrozole well so we will continue this medication as prescribed.  Continue Fosamax, calcium, and vitamin D supplementation.  Repeat bone mineral density in September 2022.    4.  Anemia: Patient's hemoglobin is 9.2, monitor.  Hortencia Pilar,  MD  08/16/2020 8:19 AM

## 2020-08-16 NOTE — Interval H&P Note (Signed)
History and Physical Interval Note:  08/16/2020 8:26 AM  [name]  has presented today for surgery, with the diagnosis of Porta Cath Placement  Lung Ca.  The various methods of treatment have been discussed with the patient and family. After consideration of risks, benefits and other options for treatment, the patient has consented to  Procedure(s): PORTA CATH INSERTION (N/A) as a surgical intervention.  The patient's history has been reviewed, patient examined, no change in status, stable for surgery.  I have reviewed the patient's chart and labs.  Questions were answered to the patient's satisfaction.     Hortencia Pilar

## 2020-08-16 NOTE — Op Note (Signed)
OPERATIVE NOTE   PROCEDURE: 1. Placement of a right IJ Infuse-a-Port  PRE-OPERATIVE DIAGNOSIS: Squamous cell carcinoma along  POST-OPERATIVE DIAGNOSIS: Same  SURGEON: Katha Cabal M.D.  ANESTHESIA: Conscious sedation was administered under my direct supervision by the interventional radiology RN. IV Versed plus fentanyl were utilized. Continuous ECG, pulse oximetry and blood pressure was monitored throughout the entire procedure. Conscious sedation was for a total of 40 minutes.   ESTIMATED BLOOD LOSS: Minimal   FINDING(S): 1.  Patent vein  SPECIMEN(S): None  INDICATIONS:   [name] is a 87 y.o. female who presents with squamous cell carcinoma of the lung.  She will require chemotherapy and therefore appropriate parenteral access.  Infuse-a-Port is being placed.  Risks and benefits were reviewed all questions answered all are in agreement with proceeding  DESCRIPTION: After obtaining full informed written consent, the patient was brought back to the special procedure suite and placed in the supine position. The patient's right neck and chest wall are prepped and draped in sterile fashion. Appropriate timeout was called.  Ultrasound is placed in a sterile sleeve, ultrasound is utilized to avoid vascular injury as well as secondary to lack of appropriate landmarks. The right internal jugular vein is identified. It is echolucent and homogeneous as well as easily compressible indicating patency. An image is recorded for the permanent record.  Access to the vein with a micropuncture needle is done under direct ultrasound visualization.  1% lidocaine is infiltrated into the soft tissue at the base of the neck as well as on the chest wall.  Under direct ultrasound visualization a micro-needle is inserted into the vein followed by the micro-wire. Micro-sheath was then advanced and a J wire is inserted without difficulty under fluoroscopic guidance. A small counterincision was  created at the wire insertion site. A transverse incision is created 2 fingerbreadths below the scapula and a pocket is fashioned using both blunt and sharp dissection. The pocket is tested for appropriate size with the hub of the Infuse-a-Port. The tunneling device is then used to pull the intravascular portion of the catheter from the pocket to the neck counterincision.  Dilator and peel-away sheath were then inserted over the wire and the wire is removed. Catheter is then advanced into the venous system without difficulty. Peel-away sheath was then removed.  Catheter is then positioned under fluoroscopic guidance at the atrial caval junction. It is then transected connected to the hub and the hope is slipped into the subcutaneous pocket on the chest wall. The hub was then accessed percutaneously and aspirates easily and flushes well and is flushed with 30 cc of heparinized saline. The pocket incision is then closed in layers using interrupted 3-0 Vicryl for the subcutaneous tissues and 4-0 Monocryl subcuticular for skin closure. Dermabond is applied. The neck counterincision was closed with 4-0 Monocryl subcuticular and Dermabond as well.  The patient tolerated the procedure well and there were no immediate complications.  COMPLICATIONS: None  CONDITION: Unchanged  Katha Cabal M.D. [facility] Office: [phone]   08/16/2020, 9:28 AM

## 2020-08-17 ENCOUNTER — Other Ambulatory Visit: Payer: Self-pay

## 2020-08-17 ENCOUNTER — Encounter: Payer: Self-pay | Admitting: Radiation Oncology

## 2020-08-17 ENCOUNTER — Ambulatory Visit
Admission: RE | Admit: 2020-08-17 | Discharge: 2020-08-17 | Disposition: A | Discharge status: Home / Self Care | Payer: Medicare HMO | Source: Ambulatory Visit | Attending: Radiation Oncology | Admitting: Radiation Oncology

## 2020-08-17 ENCOUNTER — Encounter: Payer: Self-pay | Admitting: Oncology

## 2020-08-17 ENCOUNTER — Inpatient Hospital Stay (HOSPITAL_BASED_OUTPATIENT_CLINIC_OR_DEPARTMENT_OTHER): Payer: Medicare HMO | Admitting: Oncology

## 2020-08-17 VITALS — BP 135/69 | HR 65 | Temp 97.1°F | Wt 151.3 lb

## 2020-08-17 VITALS — BP 114/61 | HR 77 | Temp 97.0°F | Resp 18 | Ht 62.0 in | Wt 151.0 lb

## 2020-08-17 DIAGNOSIS — Z853 Personal history of malignant neoplasm of breast: Secondary | ICD-10-CM | POA: Insufficient documentation

## 2020-08-17 DIAGNOSIS — Z79899 Other long term (current) drug therapy: Secondary | ICD-10-CM | POA: Diagnosis not present

## 2020-08-17 DIAGNOSIS — Z7901 Long term (current) use of anticoagulants: Secondary | ICD-10-CM | POA: Diagnosis not present

## 2020-08-17 DIAGNOSIS — C50411 Malignant neoplasm of upper-outer quadrant of right female breast: Secondary | ICD-10-CM | POA: Diagnosis present

## 2020-08-17 DIAGNOSIS — M810 Age-related osteoporosis without current pathological fracture: Secondary | ICD-10-CM | POA: Diagnosis not present

## 2020-08-17 DIAGNOSIS — C3492 Malignant neoplasm of unspecified part of left bronchus or lung: Secondary | ICD-10-CM | POA: Diagnosis present

## 2020-08-17 DIAGNOSIS — Z170 Estrogen receptor positive status [ER+]: Secondary | ICD-10-CM | POA: Insufficient documentation

## 2020-08-17 DIAGNOSIS — I442 Atrioventricular block, complete: Secondary | ICD-10-CM | POA: Diagnosis not present

## 2020-08-17 DIAGNOSIS — I10 Essential (primary) hypertension: Secondary | ICD-10-CM | POA: Insufficient documentation

## 2020-08-17 DIAGNOSIS — E785 Hyperlipidemia, unspecified: Secondary | ICD-10-CM | POA: Diagnosis not present

## 2020-08-17 DIAGNOSIS — Z79811 Long term (current) use of aromatase inhibitors: Secondary | ICD-10-CM | POA: Diagnosis not present

## 2020-08-17 DIAGNOSIS — E876 Hypokalemia: Secondary | ICD-10-CM | POA: Insufficient documentation

## 2020-08-17 DIAGNOSIS — C3491 Malignant neoplasm of unspecified part of right bronchus or lung: Secondary | ICD-10-CM

## 2020-08-17 DIAGNOSIS — Z8673 Personal history of transient ischemic attack (TIA), and cerebral infarction without residual deficits: Secondary | ICD-10-CM | POA: Insufficient documentation

## 2020-08-17 DIAGNOSIS — Z803 Family history of malignant neoplasm of breast: Secondary | ICD-10-CM | POA: Insufficient documentation

## 2020-08-17 DIAGNOSIS — E7211 Homocystinuria: Secondary | ICD-10-CM | POA: Diagnosis not present

## 2020-08-17 DIAGNOSIS — K219 Gastro-esophageal reflux disease without esophagitis: Secondary | ICD-10-CM | POA: Diagnosis not present

## 2020-08-17 DIAGNOSIS — C3481 Malignant neoplasm of overlapping sites of right bronchus and lung: Secondary | ICD-10-CM | POA: Insufficient documentation

## 2020-08-17 DIAGNOSIS — Z87891 Personal history of nicotine dependence: Secondary | ICD-10-CM | POA: Diagnosis not present

## 2020-08-17 DIAGNOSIS — D649 Anemia, unspecified: Secondary | ICD-10-CM | POA: Diagnosis not present

## 2020-08-17 LAB — CUP PACEART REMOTE DEVICE CHECK
Battery Impedance: 1319 Ohm
Battery Remaining Longevity: 44 mo
Battery Voltage: 2.75 V
Brady Statistic AP VP Percent: 23 %
Brady Statistic AP VS Percent: 0 %
Brady Statistic AS VP Percent: 77 %
Brady Statistic AS VS Percent: 0 %
Date Time Interrogation Session: 20220418080138
Implantable Lead Implant Date: 20060525
Implantable Lead Implant Date: 20060525
Implantable Lead Location: 753859
Implantable Lead Location: 753860
Implantable Lead Model: 5076
Implantable Lead Model: 5076
Implantable Pulse Generator Implant Date: 20150402
Lead Channel Impedance Value: 460 Ohm
Lead Channel Impedance Value: 466 Ohm
Lead Channel Pacing Threshold Amplitude: 0.625 V
Lead Channel Pacing Threshold Amplitude: 1.125 V
Lead Channel Pacing Threshold Pulse Width: 0.4 ms
Lead Channel Pacing Threshold Pulse Width: 0.4 ms
Lead Channel Setting Pacing Amplitude: 2 V
Lead Channel Setting Pacing Amplitude: 2.5 V
Lead Channel Setting Pacing Pulse Width: 0.52 ms
Lead Channel Setting Sensing Sensitivity: 2 mV

## 2020-08-17 MED ORDER — ONDANSETRON HCL 8 MG PO TABS: 8.0000 mg | ORAL_TABLET | Freq: Two times a day (BID) | ORAL | 2 refills | Status: DC | PRN

## 2020-08-17 MED ORDER — PROCHLORPERAZINE MALEATE 10 MG PO TABS: 10.0000 mg | ORAL_TABLET | Freq: Four times a day (QID) | ORAL | 2 refills | Status: DC | PRN

## 2020-08-17 MED ORDER — LIDOCAINE-PRILOCAINE 2.5-2.5 % EX CREA: TOPICAL_CREAM | CUTANEOUS | 3 refills | Status: DC

## 2020-08-17 NOTE — Consult Note (Signed)
NEW PATIENT EVALUATION  Name: [name]  MRN: [id number]  Date:   08/17/2020     DOB: [birth year]   This 87 y.o. female patient presents to the clinic for initial evaluation of stage IIIa (T3 N1 M0) squamous cell carcinoma the right lung.  REFERRING PHYSICIAN: Philmore Pali, NP  CHIEF COMPLAINT:  Chief Complaint  Patient presents with  . Consult    DIAGNOSIS: The encounter diagnosis was Malignant neoplasm of upper-outer quadrant of right breast in female, estrogen receptor positive ([place]).   PREVIOUS INVESTIGATIONS:  PET/CT CT scans reviewed Pathology report reviewed Clinical notes reviewed  HPI: Patient is a 87 year old female with history of stage I breast cancer ER/PR positive HER2 negative of the upper outer quadrant of the right breast.  We opted for no treatment of that lesion.  She recently developed hemoptysis and was found to have a right-sided lung lesion.  PET CT scan demonstrated hypermetabolic central right lung mass with obstruction of the right middle lobe and lower lobe bronchus.  There is associated postop obstructive collapse.  Patient also had right hilar adenopathy and a probable subcarinal node by PET/CT criteria.  Tumor directly invaded the mediastinum.  There was no distant metastatic disease.  Bronchoscopy with fine-needle aspiration of subcarinal lymph node was malignant consistent with non-small cell lung cancer right upper lobe lymph node consistent with squamous cell carcinoma.  Patient been seen by medical oncology stage IIIa disease and recommendation for concurrent chemoradiation.  She has had a port placed.  She is seen today for radiation oncology opinion.  Her mopped assist has stopped.  She is currently on Plavix and aspirin of asked her to discontinue the aspirin at this time.  She is having no pain in the chest no bone pain.  CT scan of her head showed no evidence of brain metastasis.  PLANNED TREATMENT REGIMEN: Concurrent chemoradiation  PAST  MEDICAL HISTORY:  has a past medical history of Breast cancer, right ([place]) (11/2018), Complete heart block ([place]), Dyspnea, Gait abnormality (07/23/2016), GERD (gastroesophageal reflux disease), Hemoptysis (07/25/2020), History of hypertension, History of stroke, Hyperhomocysteinemia ([place]), Hyperlipidemia, Hypertension, Hypokalemia (07/13/2018), Low blood magnesium (07/13/2018), Lung cancer ([place]), Pacemaker-Medtronic (08/07/2011), Stroke ([place]), and Vomiting (07/13/2018).    PAST SURGICAL HISTORY:  Past Surgical History:  Procedure Laterality Date  . BIOPSY  07/15/2018   Procedure: BIOPSY;  Surgeon: [name], MD;  Location: [facility];  Service: Gastroenterology;;  . BREAST BIOPSY Right 10/27/2018   affirm bx of mass, x clip,  INVASIVE MAMMARY CARCINOMA  . BREAST LUMPECTOMY Right 12/03/2018  . BRONCHIAL BIOPSY  07/29/2020   Procedure: BRONCHIAL BIOPSIES;  Surgeon: Laurin Coder, MD;  Location: [facility];  Service: Pulmonary;;  . BRONCHIAL NEEDLE ASPIRATION BIOPSY  07/29/2020   Procedure: BRONCHIAL NEEDLE ASPIRATION BIOPSIES;  Surgeon: Laurin Coder, MD;  Location: [facility];  Service: Pulmonary;;  . CARDIAC CATHETERIZATION    . ENDOBRONCHIAL ULTRASOUND N/A 07/29/2020   Procedure: ENDOBRONCHIAL ULTRASOUND;  Surgeon: Laurin Coder, MD;  Location: [facility];  Service: Pulmonary;  Laterality: N/A;  . ESOPHAGOGASTRODUODENOSCOPY Left 07/15/2018   Procedure: ESOPHAGOGASTRODUODENOSCOPY (EGD);  Surgeon: [name], MD;  Location: [facility];  Service: Gastroenterology;  Laterality: Left;  . HEMOSTASIS CONTROL  07/29/2020   Procedure: HEMOSTASIS CONTROL;  Surgeon: Laurin Coder, MD;  Location: [facility];  Service: Pulmonary;;  . PACEMAKER INSERTION     Medtronic Enpulse dual-chamber pacemaker  . PARTIAL MASTECTOMY WITH NEEDLE LOCALIZATION AND AXILLARY SENTINEL LYMPH NODE BX  Right 12/03/2018   Procedure: PARTIAL MASTECTOMY WITH NEEDLE LOCALIZATION AND AXILLARY SENTINEL LYMPH  NODE BX, RIGHT;  Surgeon: [name], MD;  Location: [facility];  Service: General;  Laterality: Right;  . PERMANENT PACEMAKER GENERATOR CHANGE N/A 07/30/2013   Procedure: PERMANENT PACEMAKER GENERATOR CHANGE;  Surgeon: [name], MD;  Location[facility] C[facility] LAB;  Service: Cardiovascular;  Laterality: N/A;  . PORTA CATH INSERTION N/A 08/16/2020   Procedure: PORTA CATH INSERTION;  Surgeon: Katha Cabal, MD;  Location: [facility];  Service: Cardiovascular;  Laterality: N/A;  . VIDEO BRONCHOSCOPY N/A 07/29/2020   Procedure: VIDEO BRONCHOSCOPY WITH FLUORO;  Surgeon: Laurin Coder, MD;  Location: [facility];  Service: Pulmonary;  Laterality: N/A;    FAMILY HISTORY: family history includes Breast cancer in her sister and sister; Hypertension in her mother.  SOCIAL HISTORY:  reports that she quit smoking about 14 years ago. She has never used smokeless tobacco. She reports that she does not drink alcohol and does not use drugs.  ALLERGIES: Patient has no known allergies.  MEDICATIONS:  Current Outpatient Medications  Medication Sig Dispense Refill  . acetaminophen (TYLENOL) 500 MG tablet Take 500-1,000 mg by mouth every 6 (six) hours as needed for moderate pain.     [name] alendronate (FOSAMAX) 70 MG tablet TAKE 1 TABLET (70 MG TOTAL) BY MOUTH ONCE A WEEK. TAKE WITH A FULL GLASS OF WATER ON AN EMPTY STOMACH. 12 tablet 2  . allopurinol (ZYLOPRIM) 100 MG tablet Take 100 mg by mouth daily.    [name] amLODipine (NORVASC) 5 MG tablet Take 5 mg by mouth daily.    . benzonatate (TESSALON) 100 MG capsule Take 100-200 mg by mouth 3 (three) times daily as needed for cough.    . Blood Pressure KIT Automated blood pressure measuring device. 1 each 0  . clopidogrel (PLAVIX) 75 MG tablet Take 75 mg by mouth daily.    . hydrochlorothiazide (HYDRODIURIL) 25 MG tablet Take 25 mg by mouth daily.    [name] letrozole (FEMARA) 2.5 MG tablet TAKE 1 TABLET BY MOUTH EVERY DAY (Patient taking differently: Take  2.5 mg by mouth daily.) 90 tablet 3  . LUMIGAN 0.01 % SOLN Place 1 drop into both eyes at bedtime.  4  . pantoprazole (PROTONIX) 40 MG tablet Take 40 mg by mouth daily.    . Potassium 99 MG TABS Take 99 mg by mouth daily.    . simvastatin (ZOCOR) 40 MG tablet Take 1 tablet (40 mg total) by mouth daily at 6 PM. 90 tablet 3   No current facility-administered medications for this encounter.    ECOG PERFORMANCE STATUS:  1 - Symptomatic but completely ambulatory  REVIEW OF SYSTEMS: Patient denies any weight loss, fatigue, weakness, fever, chills or night sweats. Patient denies any loss of vision, blurred vision. Patient denies any ringing  of the ears or hearing loss. No irregular heartbeat. Patient denies heart murmur or history of fainting. Patient denies any chest pain or pain radiating to her upper extremities. Patient denies any shortness of breath, difficulty breathing at night, cough or hemoptysis. Patient denies any swelling in the lower legs. Patient denies any nausea vomiting, vomiting of blood, or coffee ground material in the vomitus. Patient denies any stomach pain. Patient states has had normal bowel movements no significant constipation or diarrhea. Patient denies any dysuria, hematuria or significant nocturia. Patient denies any problems walking, swelling in the joints or loss of balance. Patient denies any skin changes, loss of  hair or loss of weight. Patient denies any excessive worrying or anxiety or significant depression. Patient denies any problems with insomnia. Patient denies excessive thirst, polyuria, polydipsia. Patient denies any swollen glands, patient denies easy bruising or easy bleeding. Patient denies any recent infections, allergies or URI. Patient "s visual fields have not changed significantly in recent time.   PHYSICAL EXAM: BP 135/69   Pulse 65   Temp (!) 97.1 F (36.2 C) (Tympanic)   Wt 151 lb 4.8 oz (68.6 kg)   BMI 29.55 kg/m  Wheelchair-bound obese female in  NAD.  She is on nasal oxygen.  Well-developed well-nourished patient in NAD. HEENT reveals PERLA, EOMI, discs not visualized.  Oral cavity is clear. No oral mucosal lesions are identified. Neck is clear without evidence of cervical or supraclavicular adenopathy. Lungs are clear to A&P. Cardiac examination is essentially unremarkable with regular rate and rhythm without murmur rub or thrill. Abdomen is benign with no organomegaly or masses noted. Motor sensory and DTR levels are equal and symmetric in the upper and lower extremities. Cranial nerves II through XII are grossly intact. Proprioception is intact. No peripheral adenopathy or edema is identified. No motor or sensory levels are noted. Crude visual fields are within normal range.  LABORATORY DATA: Pathology reports and cytology report reviewed    RADIOLOGY RESULTS: CT scans and PET CT scan reviewed compatible with above-stated findings   IMPRESSION: Stage IIIa (T3 N1 M0) squamous cell carcinoma the right lung in 87 year old female  PLAN: This time I agree agree with concurrent chemoradiation.  I would plan on delivering 70 Gray in 7 weeks using IMRT treatment planning and delivery.  I would use IMRT treatment planning to avoid critical structures such as spinal cord heart normal lung.  Risks and benefits of treatment occluding increasing cough fatigue alteration of blood counts possible dysphagia from radiation esophagitis skin reaction all were described in detail to the patient and her daughter.  They both seem to comprehend my treatment plan well.  I have personally set up and ordered CT simulation.  We use 4-dimensional treatment planning as well as motion restriction to try to spare as much normal lung function as possible.  There will be extra effort by both professional staff as well as technical staff to coordinate and manage concurrent chemoradiation and ensuing side effects during her treatments.  I would like to take this opportunity to  thank you for allowing me to participate in the care of your patient.Noreene Filbert, MD

## 2020-08-17 NOTE — Progress Notes (Signed)
START ON PATHWAY REGIMEN - Non-Small Cell Lung     Administer weekly:     Paclitaxel      Carboplatin   **Always confirm dose/schedule in your pharmacy ordering system**  Patient Characteristics: Preoperative or Nonsurgical Candidate (Clinical Staging), Stage III - Nonsurgical Candidate (Nonsquamous and Squamous), PS = 0, 1 Therapeutic Status: Preoperative or Nonsurgical Candidate (Clinical Staging) AJCC T Category: cT3 AJCC N Category: cN1 AJCC M Category: cM0 AJCC 8 Stage Grouping: IIIA ECOG Performance Status: 1 Intent of Therapy: Curative Intent, Discussed with Patient

## 2020-08-23 ENCOUNTER — Ambulatory Visit
Admission: RE | Admit: 2020-08-23 | Discharge: 2020-08-23 | Disposition: A | Discharge status: Home / Self Care | Payer: Medicare HMO | Source: Ambulatory Visit | Attending: Radiation Oncology | Admitting: Radiation Oncology

## 2020-08-23 DIAGNOSIS — C3491 Malignant neoplasm of unspecified part of right bronchus or lung: Secondary | ICD-10-CM | POA: Insufficient documentation

## 2020-08-24 NOTE — Patient Instructions (Signed)
Paclitaxel injection What is this medicine? PACLITAXEL (PAK li TAX el) is a chemotherapy drug. It targets fast dividing cells, like cancer cells, and causes these cells to die. This medicine is used to treat ovarian cancer, breast cancer, lung cancer, Kaposi's sarcoma, and other cancers. This medicine may be used for other purposes; ask your health care provider or pharmacist if you have questions. COMMON BRAND NAME(S): Onxol, Taxol What should I tell my health care provider before I take this medicine? They need to know if you have any of these conditions:  history of irregular heartbeat  liver disease  low blood counts, like low white cell, platelet, or red cell counts  lung or breathing disease, like asthma  tingling of the fingers or toes, or other nerve disorder  an unusual or allergic reaction to paclitaxel, alcohol, polyoxyethylated castor oil, other chemotherapy, other medicines, foods, dyes, or preservatives  pregnant or trying to get pregnant  breast-feeding How should I use this medicine? This drug is given as an infusion into a vein. It is administered in a hospital or clinic by a specially trained health care professional. Talk to your pediatrician regarding the use of this medicine in children. Special care may be needed. Overdosage: If you think you have taken too much of this medicine contact a poison control center or emergency room at once. NOTE: This medicine is only for you. Do not share this medicine with others. What if I miss a dose? It is important not to miss your dose. Call your doctor or health care professional if you are unable to keep an appointment. What may interact with this medicine? Do not take this medicine with any of the following medications:  live virus vaccines This medicine may also interact with the following medications:  antiviral medicines for hepatitis, HIV or AIDS  certain antibiotics like erythromycin and clarithromycin  certain  medicines for fungal infections like ketoconazole and itraconazole  certain medicines for seizures like carbamazepine, phenobarbital, phenytoin  gemfibrozil  nefazodone  rifampin  St. John's wort This list may not describe all possible interactions. Give your health care provider a list of all the medicines, herbs, non-prescription drugs, or dietary supplements you use. Also tell them if you smoke, drink alcohol, or use illegal drugs. Some items may interact with your medicine. What should I watch for while using this medicine? Your condition will be monitored carefully while you are receiving this medicine. You will need important blood work done while you are taking this medicine. This medicine can cause serious allergic reactions. To reduce your risk you will need to take other medicine(s) before treatment with this medicine. If you experience allergic reactions like skin rash, itching or hives, swelling of the face, lips, or tongue, tell your doctor or health care professional right away. In some cases, you may be given additional medicines to help with side effects. Follow all directions for their use. This drug may make you feel generally unwell. This is not uncommon, as chemotherapy can affect healthy cells as well as cancer cells. Report any side effects. Continue your course of treatment even though you feel ill unless your doctor tells you to stop. Call your doctor or health care professional for advice if you get a fever, chills or sore throat, or other symptoms of a cold or flu. Do not treat yourself. This drug decreases your body's ability to fight infections. Try to avoid being around people who are sick. This medicine may increase your risk to bruise  or bleed. Call your doctor or health care professional if you notice any unusual bleeding. Be careful brushing and flossing your teeth or using a toothpick because you may get an infection or bleed more easily. If you have any dental  work done, tell your dentist you are receiving this medicine. Avoid taking products that contain aspirin, acetaminophen, ibuprofen, naproxen, or ketoprofen unless instructed by your doctor. These medicines may hide a fever. Do not become pregnant while taking this medicine. Women should inform their doctor if they wish to become pregnant or think they might be pregnant. There is a potential for serious side effects to an unborn child. Talk to your health care professional or pharmacist for more information. Do not breast-feed an infant while taking this medicine. Men are advised not to father a child while receiving this medicine. This product may contain alcohol. Ask your pharmacist or healthcare provider if this medicine contains alcohol. Be sure to tell all healthcare providers you are taking this medicine. Certain medicines, like metronidazole and disulfiram, can cause an unpleasant reaction when taken with alcohol. The reaction includes flushing, headache, nausea, vomiting, sweating, and increased thirst. The reaction can last from 30 minutes to several hours. What side effects may I notice from receiving this medicine? Side effects that you should report to your doctor or health care professional as soon as possible:  allergic reactions like skin rash, itching or hives, swelling of the face, lips, or tongue  breathing problems  changes in vision  fast, irregular heartbeat  high or low blood pressure  mouth sores  pain, tingling, numbness in the hands or feet  signs of decreased platelets or bleeding - bruising, pinpoint red spots on the skin, black, tarry stools, blood in the urine  signs of decreased red blood cells - unusually weak or tired, feeling faint or lightheaded, falls  signs of infection - fever or chills, cough, sore throat, pain or difficulty passing urine  signs and symptoms of liver injury like dark yellow or brown urine; general ill feeling or flu-like symptoms;  light-colored stools; loss of appetite; nausea; right upper belly pain; unusually weak or tired; yellowing of the eyes or skin  swelling of the ankles, feet, hands  unusually slow heartbeat Side effects that usually do not require medical attention (report to your doctor or health care professional if they continue or are bothersome):  diarrhea  hair loss  loss of appetite  muscle or joint pain  nausea, vomiting  pain, redness, or irritation at site where injected  tiredness This list may not describe all possible side effects. Call your doctor for medical advice about side effects. You may report side effects to FDA at 1-800-FDA-1088. Where should I keep my medicine? This drug is given in a hospital or clinic and will not be stored at home. NOTE: This sheet is a summary. It may not cover all possible information. If you have questions about this medicine, talk to your doctor, pharmacist, or health care provider.  2021 [employer] (2019-03-18 13:37:23) Carboplatin injection What is this medicine? CARBOPLATIN (KAR boe pla tin) is a chemotherapy drug. It targets fast dividing cells, like cancer cells, and causes these cells to die. This medicine is used to treat ovarian cancer and many other cancers. This medicine may be used for other purposes; ask your health care provider or pharmacist if you have questions. COMMON BRAND NAME(S): Paraplatin What should I tell my health care provider before I take this medicine? They need to  know if you have any of these conditions:  blood disorders  hearing problems  kidney disease  recent or ongoing radiation therapy  an unusual or allergic reaction to carboplatin, cisplatin, other chemotherapy, other medicines, foods, dyes, or preservatives  pregnant or trying to get pregnant  breast-feeding How should I use this medicine? This drug is usually given as an infusion into a vein. It is administered in a hospital or clinic by  a specially trained health care professional. Talk to your pediatrician regarding the use of this medicine in children. Special care may be needed. Overdosage: If you think you have taken too much of this medicine contact a poison control center or emergency room at once. NOTE: This medicine is only for you. Do not share this medicine with others. What if I miss a dose? It is important not to miss a dose. Call your doctor or health care professional if you are unable to keep an appointment. What may interact with this medicine?  medicines for seizures  medicines to increase blood counts like filgrastim, pegfilgrastim, sargramostim  some antibiotics like amikacin, gentamicin, neomycin, streptomycin, tobramycin  vaccines Talk to your doctor or health care professional before taking any of these medicines:  acetaminophen  aspirin  ibuprofen  ketoprofen  naproxen This list may not describe all possible interactions. Give your health care provider a list of all the medicines, herbs, non-prescription drugs, or dietary supplements you use. Also tell them if you smoke, drink alcohol, or use illegal drugs. Some items may interact with your medicine. What should I watch for while using this medicine? Your condition will be monitored carefully while you are receiving this medicine. You will need important blood work done while you are taking this medicine. This drug may make you feel generally unwell. This is not uncommon, as chemotherapy can affect healthy cells as well as cancer cells. Report any side effects. Continue your course of treatment even though you feel ill unless your doctor tells you to stop. In some cases, you may be given additional medicines to help with side effects. Follow all directions for their use. Call your doctor or health care professional for advice if you get a fever, chills or sore throat, or other symptoms of a cold or flu. Do not treat yourself. This drug decreases  your body's ability to fight infections. Try to avoid being around people who are sick. This medicine may increase your risk to bruise or bleed. Call your doctor or health care professional if you notice any unusual bleeding. Be careful brushing and flossing your teeth or using a toothpick because you may get an infection or bleed more easily. If you have any dental work done, tell your dentist you are receiving this medicine. Avoid taking products that contain aspirin, acetaminophen, ibuprofen, naproxen, or ketoprofen unless instructed by your doctor. These medicines may hide a fever. Do not become pregnant while taking this medicine. Women should inform their doctor if they wish to become pregnant or think they might be pregnant. There is a potential for serious side effects to an unborn child. Talk to your health care professional or pharmacist for more information. Do not breast-feed an infant while taking this medicine. What side effects may I notice from receiving this medicine? Side effects that you should report to your doctor or health care professional as soon as possible:  allergic reactions like skin rash, itching or hives, swelling of the face, lips, or tongue  signs of infection - fever or  chills, cough, sore throat, pain or difficulty passing urine  signs of decreased platelets or bleeding - bruising, pinpoint red spots on the skin, black, tarry stools, nosebleeds  signs of decreased red blood cells - unusually weak or tired, fainting spells, lightheadedness  breathing problems  changes in hearing  changes in vision  chest pain  high blood pressure  low blood counts - This drug may decrease the number of white blood cells, red blood cells and platelets. You may be at increased risk for infections and bleeding.  nausea and vomiting  pain, swelling, redness or irritation at the injection site  pain, tingling, numbness in the hands or feet  problems with balance,  talking, walking  trouble passing urine or change in the amount of urine Side effects that usually do not require medical attention (report to your doctor or health care professional if they continue or are bothersome):  hair loss  loss of appetite  metallic taste in the mouth or changes in taste This list may not describe all possible side effects. Call your doctor for medical advice about side effects. You may report side effects to FDA at 1-800-FDA-1088. Where should I keep my medicine? This drug is given in a hospital or clinic and will not be stored at home. NOTE: This sheet is a summary. It may not cover all possible information. If you have questions about this medicine, talk to your doctor, pharmacist, or health care provider.  2021 [employer] Standard (2007-07-22 14:38:05)

## 2020-08-25 ENCOUNTER — Inpatient Hospital Stay: Payer: Medicare HMO

## 2020-08-25 DIAGNOSIS — C50411 Malignant neoplasm of upper-outer quadrant of right female breast: Secondary | ICD-10-CM | POA: Diagnosis not present

## 2020-08-25 DIAGNOSIS — C3491 Malignant neoplasm of unspecified part of right bronchus or lung: Secondary | ICD-10-CM

## 2020-08-25 LAB — COMPREHENSIVE METABOLIC PANEL
ALT: 9 U/L (ref 0–44)
AST: 16 U/L (ref 15–41)
Albumin: 2.6 g/dL — ABNORMAL LOW (ref 3.5–5.0)
Alkaline Phosphatase: 78 U/L (ref 38–126)
Anion gap: 9 (ref 5–15)
BUN: 14 mg/dL (ref 8–23)
CO2: 30 mmol/L (ref 22–32)
Calcium: 9.1 mg/dL (ref 8.9–10.3)
Chloride: 103 mmol/L (ref 98–111)
Creatinine, Ser: 0.9 mg/dL (ref 0.44–1.00)
GFR, Estimated: >60 mL/min (ref >60)
Glucose, Bld: 91 mg/dL (ref 70–99)
Potassium: 3.3 mmol/L — ABNORMAL LOW (ref 3.5–5.1)
Sodium: 142 mmol/L (ref 135–145)
Total Bilirubin: 0.4 mg/dL (ref 0.3–1.2)
Total Protein: 8.1 g/dL (ref 6.5–8.1)

## 2020-08-25 LAB — CBC WITH DIFFERENTIAL/PLATELET
Abs Immature Granulocytes: 0.02 10*3/uL (ref 0.00–0.07)
Basophils Absolute: 0 10*3/uL (ref 0.0–0.1)
Basophils Relative: 0 %
Eosinophils Absolute: 0.1 10*3/uL (ref 0.0–0.5)
Eosinophils Relative: 2 %
HCT: 30.8 % — ABNORMAL LOW (ref 36.0–46.0)
Hemoglobin: 9 g/dL — ABNORMAL LOW (ref 12.0–15.0)
Immature Granulocytes: 0 %
Lymphocytes Relative: 21 %
Lymphs Abs: 1.2 10*3/uL (ref 0.7–4.0)
MCH: 25.6 pg — ABNORMAL LOW (ref 26.0–34.0)
MCHC: 29.2 g/dL — ABNORMAL LOW (ref 30.0–36.0)
MCV: 87.7 fL (ref 80.0–100.0)
Monocytes Absolute: 0.4 10*3/uL (ref 0.1–1.0)
Monocytes Relative: 7 %
Neutro Abs: 4 10*3/uL (ref 1.7–7.7)
Neutrophils Relative %: 70 %
Platelets: 313 10*3/uL (ref 150–400)
RBC: 3.51 MIL/uL — ABNORMAL LOW (ref 3.87–5.11)
RDW: 16.2 % — ABNORMAL HIGH (ref 11.5–15.5)
WBC: 5.7 10*3/uL (ref 4.0–10.5)
nRBC: 0 % (ref 0.0–0.2)

## 2020-08-26 ENCOUNTER — Other Ambulatory Visit: Payer: Self-pay

## 2020-08-26 DIAGNOSIS — C3491 Malignant neoplasm of unspecified part of right bronchus or lung: Secondary | ICD-10-CM | POA: Diagnosis not present

## 2020-08-27 NOTE — Progress Notes (Signed)
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  INTERVAL HISTORY: Patient returns to clinic today for further evaluation and initiation of cycle 1 of weekly carboplatin and Taxol along with concurrent XRT.  Radiation will start tomorrow.  She currently feels well and is asymptomatic. She has no neurologic complaints.  She denies any recent fevers.  She has a poor appetite, but denies any weight loss.  She denies any chest pain, shortness of breath, hemoptysis, or cough.  She denies any nausea, vomiting, constipation, or diarrhea.  She has no urinary complaints.  Patient offers no further specific complaints today.  REVIEW OF SYSTEMS:   Review of Systems  Constitutional: Positive for weight loss. Negative for fever and malaise/fatigue.  Respiratory: Negative.  Negative for cough, hemoptysis and shortness of breath.   Cardiovascular: Negative.  Negative for chest pain and leg swelling.  Gastrointestinal: Negative.  Negative for abdominal pain.  Genitourinary: Negative.  Negative for dysuria.  Musculoskeletal: Negative.  Negative for back pain.  Skin: Negative.  Negative for rash.  Neurological: Negative.  Negative for dizziness, focal weakness, weakness and headaches.  Psychiatric/Behavioral: Negative.  The patient is not nervous/anxious.     As per HPI. Otherwise, a complete review of systems is negative.  PAST MEDICAL HISTORY: Past Medical History:  Diagnosis Date  .  Breast cancer, right ([place]) 11/2018   Mastectomy  . Complete heart block ([place])   . Dyspnea   . Gait abnormality 07/23/2016  . GERD (gastroesophageal reflux disease)   . Hemoptysis 07/25/2020  . History of hypertension   . History of stroke   . Hyperhomocysteinemia ([place])   . Hyperlipidemia   . Hypertension   . Hypokalemia 07/13/2018  . Low blood magnesium 07/13/2018  . Lung cancer ([place])   . Pacemaker-Medtronic 08/07/2011  . Stroke [facility])    left sided weakness, 07/28/20- no residual   . Vomiting 07/13/2018    PAST SURGICAL HISTORY: Past Surgical History:  Procedure Laterality Date  . BIOPSY  07/15/2018   Procedure: BIOPSY;  Surgeon: [name], MD;  Location: [facility];  Service: Gastroenterology;;  . BREAST BIOPSY Right 10/27/2018   affirm bx of mass, x clip,  INVASIVE MAMMARY CARCINOMA  . BREAST LUMPECTOMY Right 12/03/2018  . BRONCHIAL BIOPSY  07/29/2020   Procedure: BRONCHIAL BIOPSIES;  Surgeon: Laurin Coder, MD;  Location: [facility] ENDOSCOPY;  Service: Pulmonary;;  . BRONCHIAL NEEDLE ASPIRATION BIOPSY  07/29/2020   Procedure: BRONCHIAL NEEDLE ASPIRATION BIOPSIES;  Surgeon: Laurin Coder, MD;  Location: [place] ENDOSCOPY;  Service: Pulmonary;;  . CARDIAC CATHETERIZATION    . ENDOBRONCHIAL ULTRASOUND N/A 07/29/2020   Procedure: ENDOBRONCHIAL ULTRASOUND;  Surgeon: Laurin Coder, MD;  Location: [place] ENDOSCOPY;  Service: Pulmonary;  Laterality: N/A;  . ESOPHAGOGASTRODUODENOSCOPY Left 07/15/2018   Procedure: ESOPHAGOGASTRODUODENOSCOPY (EGD);  Surgeon: [name], MD;  Location: [facility];  Service: Gastroenterology;  Laterality: Left;  . HEMOSTASIS CONTROL  07/29/2020   Procedure: HEMOSTASIS CONTROL;  Surgeon: Laurin Coder, MD;  Location: [place] ENDOSCOPY;  Service: Pulmonary;;  . PACEMAKER INSERTION     Medtronic Enpulse dual-chamber pacemaker  . PARTIAL MASTECTOMY WITH NEEDLE LOCALIZATION AND AXILLARY SENTINEL LYMPH NODE BX Right 12/03/2018   Procedure: PARTIAL MASTECTOMY  WITH NEEDLE LOCALIZATION AND AXILLARY SENTINEL LYMPH NODE BX, RIGHT;  Surgeon: [name], MD;  Location: [facility] ORS;  Service: General;  Laterality: Right;  . PERMANENT PACEMAKER GENERATOR CHANGE N/A 07/30/2013   Procedure: PERMANENT PACEMAKER GENERATOR CHANGE;  Surgeon: [name], MD;  Location: [facility] CATH LAB;  Service: Cardiovascular;  Laterality: N/A;  . PORTA CATH INSERTION N/A 08/16/2020   Procedure: PORTA CATH INSERTION;  Surgeon: Katha Cabal, MD;  Location: [facility] CV LAB;  Service: Cardiovascular;  Laterality: N/A;  . VIDEO BRONCHOSCOPY N/A 07/29/2020   Procedure: VIDEO BRONCHOSCOPY WITH FLUORO;  Surgeon: Laurin Coder, MD;  Location: [facility] ENDOSCOPY;  Service: Pulmonary;  Laterality: N/A;    FAMILY HISTORY: Family History  Problem Relation Age of Onset  . Hypertension Mother   . Breast cancer Sister   . Breast cancer Sister   . Colon cancer Neg Hx   . Esophageal cancer Neg Hx     ADVANCED DIRECTIVES (Y/N):  N  HEALTH MAINTENANCE: Social History   Tobacco Use  . Smoking status: Former Smoker    Quit date: 07/25/2006    Years since quitting: 14.1  . Smokeless tobacco: Never Used  Vaping Use  . Vaping Use: Never used  Substance Use Topics  . Alcohol use: No  . Drug use: No     Colonoscopy:  PAP:  Bone density:  Lipid panel:  No Known Allergies  Current Outpatient Medications  Medication Sig Dispense Refill  . acetaminophen (TYLENOL) 500 MG tablet Take 500-1,000 mg by mouth every 6 (six) hours as needed for moderate pain.     [name] alendronate (FOSAMAX) 70 MG tablet TAKE 1 TABLET (70 MG TOTAL) BY MOUTH ONCE A WEEK. TAKE WITH A FULL GLASS OF WATER ON AN EMPTY STOMACH. 12 tablet 2  . allopurinol (ZYLOPRIM) 100 MG tablet Take 100 mg by mouth daily.    [name] amLODipine (NORVASC) 5 MG tablet Take 5 mg by mouth daily.    . benzonatate (TESSALON) 100 MG capsule Take 100-200 mg by mouth 3 (three) times daily as needed for cough.    . Blood Pressure KIT  Automated blood pressure measuring device. 1 each 0  . clopidogrel (PLAVIX) 75 MG tablet Take 75 mg by mouth daily.    . hydrochlorothiazide (HYDRODIURIL) 25 MG tablet Take 25 mg by mouth daily.    [name] letrozole (FEMARA) 2.5 MG tablet TAKE 1 TABLET BY MOUTH EVERY DAY (Patient taking differently: Take 2.5 mg by mouth daily.) 90 tablet 3  . lidocaine-prilocaine (EMLA) cream Apply to affected area once 30 g 3  . LUMIGAN 0.01 % SOLN Place 1 drop into both eyes at bedtime.  4  . ondansetron (ZOFRAN) 8 MG tablet Take 1 tablet (8 mg total) by mouth 2 (two) times daily as needed for refractory nausea / vomiting. 60 tablet 2  . pantoprazole (PROTONIX) 40 MG tablet Take 40 mg by mouth daily.    . Potassium 99 MG TABS Take 99 mg by mouth daily.    . prochlorperazine (COMPAZINE) 10 MG tablet Take 1 tablet (10 mg total) by mouth every 6 (six) hours as needed (Nausea or vomiting). 60 tablet 2  . simvastatin (ZOCOR) 40 MG tablet Take 1 tablet (40 mg total) by mouth daily at 6 PM. 90 tablet  3   No current facility-administered medications for this visit.   Facility-Administered Medications Ordered in Other Visits  Medication Dose Route Frequency Provider Last Rate Last Admin  . heparin lock flush 100 unit/mL  500 Units Intracatheter Once PRN [name], MD        OBJECTIVE: Vitals:   08/30/20 0857  BP: 112/69  Pulse: 85  Resp: 17  Temp: 99 F (37.2 C)  SpO2: 100%     Body mass index is 26.98 kg/m.    ECOG FS:0 - Asymptomatic  General: Well-developed, well-nourished, no acute distress.  Sitting in a wheelchair. Eyes: Pink conjunctiva, anicteric sclera. HEENT: Normocephalic, moist mucous membranes. Lungs: No audible wheezing or coughing. Heart: Regular rate and rhythm. Abdomen: Soft, nontender, no obvious distention. Musculoskeletal: No edema, cyanosis, or clubbing. Neuro: Alert, answering all questions appropriately. Cranial nerves grossly intact. Skin: No rashes or petechiae  noted. Psych: Normal affect.   LAB RESULTS:  Lab Results  Component Value Date   NA 136 08/30/2020   K 3.2 (L) 08/30/2020   CL 96 (L) 08/30/2020   CO2 29 08/30/2020   GLUCOSE 101 (H) 08/30/2020   BUN 13 08/30/2020   CREATININE 0.88 08/30/2020   CALCIUM 8.5 (L) 08/30/2020   PROT 8.2 (H) 08/30/2020   ALBUMIN 2.6 (L) 08/30/2020   AST 13 (L) 08/30/2020   ALT 7 08/30/2020   ALKPHOS 73 08/30/2020   BILITOT 0.8 08/30/2020   GFRNONAA >60 08/30/2020   GFRAA >60 07/24/2018    Lab Results  Component Value Date   WBC 6.5 08/30/2020   NEUTROABS 4.7 08/30/2020   HGB 9.4 (L) 08/30/2020   HCT 31.4 (L) 08/30/2020   MCV 87.2 08/30/2020   PLT 333 08/30/2020     STUDIES: CT Head W Wo Contrast  Result Date: 08/15/2020 CLINICAL DATA:  Staging non-small cell lung cancer. EXAM: CT HEAD WITHOUT AND WITH CONTRAST TECHNIQUE: Contiguous axial images were obtained from the base of the skull through the vertex without and with intravenous contrast CONTRAST:  82m OMNIPAQUE IOHEXOL 300 MG/ML  SOLN COMPARISON:  Head CT 07/15/2018 and 05/26/2018. FINDINGS: Brain: No focal abnormality affects brainstem. Old infarction of the inferior cerebellum on the left. Cerebral hemispheres show old infarction in the right basal ganglia and radiating white matter tracts and chronic small-vessel change of the hemispheric white matter. No large vessel territory infarction. No mass lesion, hemorrhage, hydrocephalus or extra-axial collection. No other cause of abnormal enhancement is identified. Vascular: There is atherosclerotic calcification of the major vessels at the base of the brain. Skull: Negative Sinuses/Orbits: Clear/normal Other: None IMPRESSION: No evidence of metastatic disease. Old infarction in the right basal ganglia and radiating white matter tracts. Old infarction of the inferior cerebellum on the left. Chronic small-vessel change of the hemispheric white matter. Electronically Signed   By: MNelson ChimesM.D.    On: 08/15/2020 15:09   PERIPHERAL VASCULAR CATHETERIZATION  Result Date: 08/16/2020 See Op Note  NM PET Image Initial (PI) Skull Base To Thigh  Result Date: 08/15/2020 CLINICAL DATA:  Initial treatment strategy for lung cancer. EXAM: NUCLEAR MEDICINE PET SKULL BASE TO THIGH TECHNIQUE: 8.6 mCi F-18 FDG was injected intravenously. Full-ring PET imaging was performed from the skull base to thigh after the radiotracer. CT data was obtained and used for attenuation correction and anatomic localization. Fasting blood glucose: 86 mg/dl COMPARISON:  CT abdomen pelvis 07/28/2010, CT chest 07/25/2020. FINDINGS: Mediastinal blood pool activity: SUV max 2.2 Liver activity: SUV max NA NECK: No  abnormal hypermetabolism. Incidental CT findings: None. CHEST: Markedly hypermetabolic right hilar nodal mass measures approximately 3.1 cm with an SUV max of 26.0. A central mass in the right hemithorax obstructs the right middle and right lower lobe bronchi, with an SUV max of 31.6. Measurement is difficult without IV contrast and due to associated hypermetabolic postobstructive collapse/consolidation. No hypermetabolic mediastinal or axillary lymph nodes. Incidental CT findings: Atherosclerotic calcification of the aorta, aortic valve and coronary arteries. Heart is enlarged. No pericardial effusion. Separate nodule in the peripheral right lower lobe measures 1.3 cm (4/87) but does not show abnormal hypermetabolism. ABDOMEN/PELVIS: No abnormal hypermetabolism in the liver, adrenal glands, spleen or pancreas. No hypermetabolic lymph nodes. Incidental CT findings: Visualized portion of the liver is unremarkable. Stones are seen in the gallbladder. Adrenal glands are unremarkable. Mixed attenuation mass in the lower pole right kidney contains fat, measuring 2.6 cm. Additional low-attenuation lesions in both kidneys are better seen on 07/27/2020. Stones in the kidneys bilaterally. Spleen, pancreas, stomach and bowel are grossly  unremarkable. Atherosclerotic calcification of the aorta. Bladder is low in volume. SKELETON: No abnormal hypermetabolism. Incidental CT findings: Degenerative changes in the spine. IMPRESSION: 1. Hypermetabolic central right lung mass with obstruction of the right middle and right lower lobe bronchi, associated postobstructive collapse/consolidation and hypermetabolic right hilar adenopathy, findings indicative of at least T2 N1 M0 or stage IB-IIA primary bronchogenic carcinoma. 2. Cholelithiasis. 3. Right renal angiomyolipoma. 4. Bilateral renal stones. 5. Aortic atherosclerosis (ICD10-I70.0). Coronary artery calcification. Electronically Signed   By: Lorin Picket M.D.   On: 08/15/2020 16:25   CUP PACEART REMOTE DEVICE CHECK  Result Date: 08/17/2020 Scheduled remote reviewed. A burden 13.3%; longest AHR 24:26 minutes EGMs suggestive AT vs AF.  1 VHR event 8 beats max v rate 274bpm.  Histogram controlled.  Normal device function.  Forwarding to triage for further review.  R. Powers, CVRS Next remote 91 days.   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  PLAN:    1.  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  CT scan results from July 25, 2020 reviewed independently and reported as above.  Patient subsequently underwent bronchoscopy on July 29, 2020 confirming the diagnosis.  PET scan results from August 15, 2020 reviewed independently and reported as above confirming stage of disease.  CT of the head did not reveal any metastatic lesions.  Plan to give weekly carboplatinum and Taxol with concurrent XRT followed by maintenance immunotherapy for 1 year.  Patient has had port placement.  Proceed with cycle 1 of weekly carboplatin and Taxol today.  Patient will initiate XRT tomorrow.  Return to clinic in 1 week for further evaluation and  consideration of cycle 2.  2.  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  Given her advanced age and small size of malignancy, Oncotype DX or adjuvant chemotherapy was not necessary.  She was also evaluated by radiation oncology who determined that adjuvant XRT was also not needed.  Continue letrozole for a total of 5 years completing treatment in August 2025.  Although we will likely discontinue treatment while she is receiving chemotherapy for her lung cancer.  Her most recent mammogram on November 13, 2019 was reported as BI-RADS 2, repeat in July  2022.    2.  Osteoporosis: Patient's most recent bone mineral density on January 14, 2020 reported T score of -2.6 which is mildly improved over 1 year prior where her T score was reported at -2.8.  She is tolerating letrozole well so we will continue this medication as prescribed.  Continue Fosamax, calcium, and vitamin D supplementation.  Repeat bone mineral density in September 2022.   3.  Anemia: Hemoglobin has trended up slightly to 9.4, monitor. 4.  Hemoptysis: Resolved. 5.  Hypokalemia: Potassium level is 3.2.  Monitor. 6.  Weight loss: Patient does not wish dietary referral at this time, but if she continues to have a poor appetite would reconsider in the future.    Patient expressed understanding and was in agreement with this plan. She also understands that She can call clinic at any time with any questions, concerns, or complaints.   Cancer Staging Malignant neoplasm of upper-outer quadrant of right breast in female, estrogen receptor positive ([place]) Staging form: Breast, AJCC 8th Edition - Clinical stage from 11/06/2018: Stage IA (cT1a, cN0, cM0, G1, ER+, PR+, HER2-) - Signed by [name], MD on 11/06/2018 Histologic grading system: 3 grade system  Squamous cell carcinoma of right lung ([place]) Staging form:  Lung, AJCC 8th Edition - Pathologic stage from 08/05/2020: Stage IIIA (pT3, pN1, cM0) - Signed by [name], MD on 08/05/2020 Stage prefix: Initial diagnosis   [name], MD   08/30/2020 1:42 PM

## 2020-08-30 ENCOUNTER — Encounter: Payer: Self-pay | Admitting: Oncology

## 2020-08-30 ENCOUNTER — Inpatient Hospital Stay (HOSPITAL_BASED_OUTPATIENT_CLINIC_OR_DEPARTMENT_OTHER): Payer: Medicare HMO | Admitting: Oncology

## 2020-08-30 ENCOUNTER — Inpatient Hospital Stay: Payer: Medicare HMO

## 2020-08-30 ENCOUNTER — Inpatient Hospital Stay: Payer: Medicare HMO | Attending: Oncology

## 2020-08-30 VITALS — BP 112/69 | HR 85 | Temp 99.0°F | Resp 17 | Ht 62.0 in | Wt 147.5 lb

## 2020-08-30 VITALS — BP 132/77 | HR 63 | Temp 98.1°F | Resp 16

## 2020-08-30 DIAGNOSIS — R5383 Other fatigue: Secondary | ICD-10-CM | POA: Diagnosis not present

## 2020-08-30 DIAGNOSIS — E876 Hypokalemia: Secondary | ICD-10-CM | POA: Insufficient documentation

## 2020-08-30 DIAGNOSIS — Z87891 Personal history of nicotine dependence: Secondary | ICD-10-CM | POA: Insufficient documentation

## 2020-08-30 DIAGNOSIS — Z8673 Personal history of transient ischemic attack (TIA), and cerebral infarction without residual deficits: Secondary | ICD-10-CM | POA: Insufficient documentation

## 2020-08-30 DIAGNOSIS — Z5111 Encounter for antineoplastic chemotherapy: Secondary | ICD-10-CM | POA: Insufficient documentation

## 2020-08-30 DIAGNOSIS — Z170 Estrogen receptor positive status [ER+]: Secondary | ICD-10-CM | POA: Insufficient documentation

## 2020-08-30 DIAGNOSIS — C3491 Malignant neoplasm of unspecified part of right bronchus or lung: Secondary | ICD-10-CM | POA: Diagnosis not present

## 2020-08-30 DIAGNOSIS — C3492 Malignant neoplasm of unspecified part of left bronchus or lung: Secondary | ICD-10-CM | POA: Insufficient documentation

## 2020-08-30 DIAGNOSIS — Z79899 Other long term (current) drug therapy: Secondary | ICD-10-CM | POA: Insufficient documentation

## 2020-08-30 DIAGNOSIS — Z803 Family history of malignant neoplasm of breast: Secondary | ICD-10-CM | POA: Diagnosis not present

## 2020-08-30 DIAGNOSIS — R634 Abnormal weight loss: Secondary | ICD-10-CM | POA: Insufficient documentation

## 2020-08-30 DIAGNOSIS — R5381 Other malaise: Secondary | ICD-10-CM | POA: Insufficient documentation

## 2020-08-30 DIAGNOSIS — C50411 Malignant neoplasm of upper-outer quadrant of right female breast: Secondary | ICD-10-CM | POA: Insufficient documentation

## 2020-08-30 DIAGNOSIS — Z79811 Long term (current) use of aromatase inhibitors: Secondary | ICD-10-CM | POA: Insufficient documentation

## 2020-08-30 LAB — CBC WITH DIFFERENTIAL/PLATELET
Abs Immature Granulocytes: 0.01 10*3/uL (ref 0.00–0.07)
Basophils Absolute: 0 10*3/uL (ref 0.0–0.1)
Basophils Relative: 0 %
Eosinophils Absolute: 0.1 10*3/uL (ref 0.0–0.5)
Eosinophils Relative: 1 %
HCT: 31.4 % — ABNORMAL LOW (ref 36.0–46.0)
Hemoglobin: 9.4 g/dL — ABNORMAL LOW (ref 12.0–15.0)
Immature Granulocytes: 0 %
Lymphocytes Relative: 18 %
Lymphs Abs: 1.2 10*3/uL (ref 0.7–4.0)
MCH: 26.1 pg (ref 26.0–34.0)
MCHC: 29.9 g/dL — ABNORMAL LOW (ref 30.0–36.0)
MCV: 87.2 fL (ref 80.0–100.0)
Monocytes Absolute: 0.5 10*3/uL (ref 0.1–1.0)
Monocytes Relative: 8 %
Neutro Abs: 4.7 10*3/uL (ref 1.7–7.7)
Neutrophils Relative %: 73 %
Platelets: 333 10*3/uL (ref 150–400)
RBC: 3.6 MIL/uL — ABNORMAL LOW (ref 3.87–5.11)
RDW: 16 % — ABNORMAL HIGH (ref 11.5–15.5)
WBC: 6.5 10*3/uL (ref 4.0–10.5)
nRBC: 0 % (ref 0.0–0.2)

## 2020-08-30 LAB — COMPREHENSIVE METABOLIC PANEL
ALT: 7 U/L (ref 0–44)
AST: 13 U/L — ABNORMAL LOW (ref 15–41)
Albumin: 2.6 g/dL — ABNORMAL LOW (ref 3.5–5.0)
Alkaline Phosphatase: 73 U/L (ref 38–126)
Anion gap: 11 (ref 5–15)
BUN: 13 mg/dL (ref 8–23)
CO2: 29 mmol/L (ref 22–32)
Calcium: 8.5 mg/dL — ABNORMAL LOW (ref 8.9–10.3)
Chloride: 96 mmol/L — ABNORMAL LOW (ref 98–111)
Creatinine, Ser: 0.88 mg/dL (ref 0.44–1.00)
GFR, Estimated: >60 mL/min (ref >60)
Glucose, Bld: 101 mg/dL — ABNORMAL HIGH (ref 70–99)
Potassium: 3.2 mmol/L — ABNORMAL LOW (ref 3.5–5.1)
Sodium: 136 mmol/L (ref 135–145)
Total Bilirubin: 0.8 mg/dL (ref 0.3–1.2)
Total Protein: 8.2 g/dL — ABNORMAL HIGH (ref 6.5–8.1)

## 2020-08-30 MED ORDER — SODIUM CHLORIDE 0.9 % IV SOLN
45.0000 mg/m2 | Freq: Once | INTRAVENOUS | Status: AC
  Administered 2020-08-30: 78 mg via INTRAVENOUS
  Filled 2020-08-30: qty 13

## 2020-08-30 MED ORDER — SODIUM CHLORIDE 0.9 % IV SOLN
140.6000 mg | Freq: Once | INTRAVENOUS | Status: AC
  Administered 2020-08-30: 140 mg via INTRAVENOUS
  Filled 2020-08-30: qty 14

## 2020-08-30 MED ORDER — HEPARIN SOD (PORK) LOCK FLUSH 100 UNIT/ML IV SOLN
INTRAVENOUS | Status: AC
  Filled 2020-08-30: qty 5

## 2020-08-30 MED ORDER — SODIUM CHLORIDE 0.9 % IV SOLN
10.0000 mg | Freq: Once | INTRAVENOUS | Status: AC
  Administered 2020-08-30: 10 mg via INTRAVENOUS
  Filled 2020-08-30: qty 10

## 2020-08-30 MED ORDER — DIPHENHYDRAMINE HCL 50 MG/ML IJ SOLN
25.0000 mg | Freq: Once | INTRAMUSCULAR | Status: AC
Start: 2020-08-30 — End: 2020-08-30
  Administered 2020-08-30: 25 mg via INTRAVENOUS
  Filled 2020-08-30: qty 1

## 2020-08-30 MED ORDER — HEPARIN SOD (PORK) LOCK FLUSH 100 UNIT/ML IV SOLN
500.0000 [IU] | Freq: Once | INTRAVENOUS | Status: AC | PRN
  Administered 2020-08-30: 500 [IU]
  Filled 2020-08-30: qty 5

## 2020-08-30 MED ORDER — PALONOSETRON HCL INJECTION 0.25 MG/5ML
0.2500 mg | Freq: Once | INTRAVENOUS | Status: AC
Start: 2020-08-30 — End: 2020-08-30
  Administered 2020-08-30: 0.25 mg via INTRAVENOUS
  Filled 2020-08-30: qty 5

## 2020-08-30 MED ORDER — FAMOTIDINE 20 MG IN NS 100 ML IVPB
20.0000 mg | Freq: Once | INTRAVENOUS | Status: AC
  Administered 2020-08-30: 20 mg via INTRAVENOUS
  Filled 2020-08-30: qty 20

## 2020-08-30 MED ORDER — SODIUM CHLORIDE 0.9 % IV SOLN
Freq: Once | INTRAVENOUS | Status: AC
Start: 2020-08-30 — End: 2020-08-30
  Filled 2020-08-30: qty 250

## 2020-08-30 NOTE — Progress Notes (Signed)
Patient tolerated first Taxol treatment well. Taxol titrated per protocol. Patient denies any questions or concerns. Discharged home.

## 2020-08-30 NOTE — Patient Instructions (Signed)
[facility]  Discharge Instructions: Thank you for choosing [facility] to provide your oncology and hematology care.  If you have a lab appointment with the [facility], please go directly to the [facility] and check in at the registration area.  Wear comfortable clothing and clothing appropriate for easy access to any Portacath or PICC line.   We strive to give you quality time with your provider. You may need to reschedule your appointment if you arrive late (15 or more minutes).  Arriving late affects you and other patients whose appointments are after yours.  Also, if you miss three or more appointments without notifying the office, you may be dismissed from the clinic at the provider's discretion.      For prescription refill requests, have your pharmacy contact our office and allow 72 hours for refills to be completed.    Today you received the following chemotherapy and/or immunotherapy agents Carboplatin & TaxolCarboplatin injection What is this medicine? CARBOPLATIN (KAR boe pla tin) is a chemotherapy drug. It targets fast dividing cells, like cancer cells, and causes these cells to die. This medicine is used to treat ovarian cancer and many other cancers. This medicine may be used for other purposes; ask your health care provider or pharmacist if you have questions. COMMON BRAND NAME(S): Paraplatin What should I tell my health care provider before I take this medicine? They need to know if you have any of these conditions:  blood disorders  hearing problems  kidney disease  recent or ongoing radiation therapy  an unusual or allergic reaction to carboplatin, cisplatin, other chemotherapy, other medicines, foods, dyes, or preservatives  pregnant or trying to get pregnant  breast-feeding How should I use this medicine? This drug is usually given as an infusion into a vein. It is administered in a hospital or clinic by a  specially trained health care professional. Talk to your pediatrician regarding the use of this medicine in children. Special care may be needed. Overdosage: If you think you have taken too much of this medicine contact a poison control center or emergency room at once. NOTE: This medicine is only for you. Do not share this medicine with others. What if I miss a dose? It is important not to miss a dose. Call your doctor or health care professional if you are unable to keep an appointment. What may interact with this medicine?  medicines for seizures  medicines to increase blood counts like filgrastim, pegfilgrastim, sargramostim  some antibiotics like amikacin, gentamicin, neomycin, streptomycin, tobramycin  vaccines Talk to your doctor or health care professional before taking any of these medicines:  acetaminophen  aspirin  ibuprofen  ketoprofen  naproxen This list may not describe all possible interactions. Give your health care provider a list of all the medicines, herbs, non-prescription drugs, or dietary supplements you use. Also tell them if you smoke, drink alcohol, or use illegal drugs. Some items may interact with your medicine. What should I watch for while using this medicine? Your condition will be monitored carefully while you are receiving this medicine. You will need important blood work done while you are taking this medicine. This drug may make you feel generally unwell. This is not uncommon, as chemotherapy can affect healthy cells as well as cancer cells. Report any side effects. Continue your course of treatment even though you feel ill unless your doctor tells you to stop. In some cases, you may be given additional medicines to  help with side effects. Follow all directions for their use. Call your doctor or health care professional for advice if you get a fever, chills or sore throat, or other symptoms of a cold or flu. Do not treat yourself. This drug decreases  your body's ability to fight infections. Try to avoid being around people who are sick. This medicine may increase your risk to bruise or bleed. Call your doctor or health care professional if you notice any unusual bleeding. Be careful brushing and flossing your teeth or using a toothpick because you may get an infection or bleed more easily. If you have any dental work done, tell your dentist you are receiving this medicine. Avoid taking products that contain aspirin, acetaminophen, ibuprofen, naproxen, or ketoprofen unless instructed by your doctor. These medicines may hide a fever. Do not become pregnant while taking this medicine. Women should inform their doctor if they wish to become pregnant or think they might be pregnant. There is a potential for serious side effects to an unborn child. Talk to your health care professional or pharmacist for more information. Do not breast-feed an infant while taking this medicine. What side effects may I notice from receiving this medicine? Side effects that you should report to your doctor or health care professional as soon as possible:  allergic reactions like skin rash, itching or hives, swelling of the face, lips, or tongue  signs of infection - fever or chills, cough, sore throat, pain or difficulty passing urine  signs of decreased platelets or bleeding - bruising, pinpoint red spots on the skin, black, tarry stools, nosebleeds  signs of decreased red blood cells - unusually weak or tired, fainting spells, lightheadedness  breathing problems  changes in hearing  changes in vision  chest pain  high blood pressure  low blood counts - This drug may decrease the number of white blood cells, red blood cells and platelets. You may be at increased risk for infections and bleeding.  nausea and vomiting  pain, swelling, redness or irritation at the injection site  pain, tingling, numbness in the hands or feet  problems with balance,  talking, walking  trouble passing urine or change in the amount of urine Side effects that usually do not require medical attention (report to your doctor or health care professional if they continue or are bothersome):  hair loss  loss of appetite  metallic taste in the mouth or changes in taste This list may not describe all possible side effects. Call your doctor for medical advice about side effects. You may report side effects to FDA at 1-800-FDA-1088. Where should I keep my medicine? This drug is given in a hospital or clinic and will not be stored at home. NOTE: This sheet is a summary. It may not cover all possible information. If you have questions about this medicine, talk to your doctor, pharmacist, or health care provider.  2021 Elsevier/Gold Standard (2007-07-22 14:38:05) Paclitaxel injection What is this medicine? PACLITAXEL (PAK li TAX el) is a chemotherapy drug. It targets fast dividing cells, like cancer cells, and causes these cells to die. This medicine is used to treat ovarian cancer, breast cancer, lung cancer, Kaposi's sarcoma, and other cancers. This medicine may be used for other purposes; ask your health care provider or pharmacist if you have questions. COMMON BRAND NAME(S): Onxol, Taxol What should I tell my health care provider before I take this medicine? They need to know if you have any of these conditions:  history of  irregular heartbeat  liver disease  low blood counts, like low white cell, platelet, or red cell counts  lung or breathing disease, like asthma  tingling of the fingers or toes, or other nerve disorder  an unusual or allergic reaction to paclitaxel, alcohol, polyoxyethylated castor oil, other chemotherapy, other medicines, foods, dyes, or preservatives  pregnant or trying to get pregnant  breast-feeding How should I use this medicine? This drug is given as an infusion into a vein. It is administered in a hospital or clinic by a  specially trained health care professional. Talk to your pediatrician regarding the use of this medicine in children. Special care may be needed. Overdosage: If you think you have taken too much of this medicine contact a poison control center or emergency room at once. NOTE: This medicine is only for you. Do not share this medicine with others. What if I miss a dose? It is important not to miss your dose. Call your doctor or health care professional if you are unable to keep an appointment. What may interact with this medicine? Do not take this medicine with any of the following medications:  live virus vaccines This medicine may also interact with the following medications:  antiviral medicines for hepatitis, HIV or AIDS  certain antibiotics like erythromycin and clarithromycin  certain medicines for fungal infections like ketoconazole and itraconazole  certain medicines for seizures like carbamazepine, phenobarbital, phenytoin  gemfibrozil  nefazodone  rifampin  St. John's wort This list may not describe all possible interactions. Give your health care provider a list of all the medicines, herbs, non-prescription drugs, or dietary supplements you use. Also tell them if you smoke, drink alcohol, or use illegal drugs. Some items may interact with your medicine. What should I watch for while using this medicine? Your condition will be monitored carefully while you are receiving this medicine. You will need important blood work done while you are taking this medicine. This medicine can cause serious allergic reactions. To reduce your risk you will need to take other medicine(s) before treatment with this medicine. If you experience allergic reactions like skin rash, itching or hives, swelling of the face, lips, or tongue, tell your doctor or health care professional right away. In some cases, you may be given additional medicines to help with side effects. Follow all directions for  their use. This drug may make you feel generally unwell. This is not uncommon, as chemotherapy can affect healthy cells as well as cancer cells. Report any side effects. Continue your course of treatment even though you feel ill unless your doctor tells you to stop. Call your doctor or health care professional for advice if you get a fever, chills or sore throat, or other symptoms of a cold or flu. Do not treat yourself. This drug decreases your body's ability to fight infections. Try to avoid being around people who are sick. This medicine may increase your risk to bruise or bleed. Call your doctor or health care professional if you notice any unusual bleeding. Be careful brushing and flossing your teeth or using a toothpick because you may get an infection or bleed more easily. If you have any dental work done, tell your dentist you are receiving this medicine. Avoid taking products that contain aspirin, acetaminophen, ibuprofen, naproxen, or ketoprofen unless instructed by your doctor. These medicines may hide a fever. Do not become pregnant while taking this medicine. Women should inform their doctor if they wish to become pregnant or think they might  be pregnant. There is a potential for serious side effects to an unborn child. Talk to your health care professional or pharmacist for more information. Do not breast-feed an infant while taking this medicine. Men are advised not to father a child while receiving this medicine. This product may contain alcohol. Ask your pharmacist or healthcare provider if this medicine contains alcohol. Be sure to tell all healthcare providers you are taking this medicine. Certain medicines, like metronidazole and disulfiram, can cause an unpleasant reaction when taken with alcohol. The reaction includes flushing, headache, nausea, vomiting, sweating, and increased thirst. The reaction can last from 30 minutes to several hours. What side effects may I notice from  receiving this medicine? Side effects that you should report to your doctor or health care professional as soon as possible:  allergic reactions like skin rash, itching or hives, swelling of the face, lips, or tongue  breathing problems  changes in vision  fast, irregular heartbeat  high or low blood pressure  mouth sores  pain, tingling, numbness in the hands or feet  signs of decreased platelets or bleeding - bruising, pinpoint red spots on the skin, black, tarry stools, blood in the urine  signs of decreased red blood cells - unusually weak or tired, feeling faint or lightheaded, falls  signs of infection - fever or chills, cough, sore throat, pain or difficulty passing urine  signs and symptoms of liver injury like dark yellow or brown urine; general ill feeling or flu-like symptoms; light-colored stools; loss of appetite; nausea; right upper belly pain; unusually weak or tired; yellowing of the eyes or skin  swelling of the ankles, feet, hands  unusually slow heartbeat Side effects that usually do not require medical attention (report to your doctor or health care professional if they continue or are bothersome):  diarrhea  hair loss  loss of appetite  muscle or joint pain  nausea, vomiting  pain, redness, or irritation at site where injected  tiredness This list may not describe all possible side effects. Call your doctor for medical advice about side effects. You may report side effects to FDA at 1-800-FDA-1088. Where should I keep my medicine? This drug is given in a hospital or clinic and will not be stored at home. NOTE: This sheet is a summary. It may not cover all possible information. If you have questions about this medicine, talk to your doctor, pharmacist, or health care provider.  2021 Elsevier/Gold Standard (2019-03-18 13:37:23)       To help prevent nausea and vomiting after your treatment, we encourage you to take your nausea medication as  directed.  BELOW ARE SYMPTOMS THAT SHOULD BE REPORTED IMMEDIATELY: . *FEVER GREATER THAN 100.4 F (38 C) OR HIGHER . *CHILLS OR SWEATING . *NAUSEA AND VOMITING THAT IS NOT CONTROLLED WITH YOUR NAUSEA MEDICATION . *UNUSUAL SHORTNESS OF BREATH . *UNUSUAL BRUISING OR BLEEDING . *URINARY PROBLEMS (pain or burning when urinating, or frequent urination) . *BOWEL PROBLEMS (unusual diarrhea, constipation, pain near the anus) . TENDERNESS IN MOUTH AND THROAT WITH OR WITHOUT PRESENCE OF ULCERS (sore throat, sores in mouth, or a toothache) . UNUSUAL RASH, SWELLING OR PAIN  . UNUSUAL VAGINAL DISCHARGE OR ITCHING   Items with * indicate a potential emergency and should be followed up as soon as possible or go to the Emergency Department if any problems should occur.  Please show the CHEMOTHERAPY ALERT CARD or IMMUNOTHERAPY ALERT CARD at check-in to the Emergency Department and triage nurse.  Should you  have questions after your visit or need to cancel or reschedule your appointment, please contact [facility]  [phone] and follow the prompts.  Office hours are 8:00 a.m. to 4:30 p.m. Monday - Friday. Please note that voicemails left after 4:00 p.m. may not be returned until the following business day.  We are closed weekends and major holidays. You have access to a nurse at all times for urgent questions. Please call the main number to the clinic [phone] and follow the prompts.  For any non-urgent questions, you may also contact your provider using MyChart. We now offer e-Visits for anyone 93 and older to request care online for non-urgent symptoms. For details visit [email or web address].   Also download the MyChart app! Go to the app store, search "MyChart", open the app, select [facility], and log in with your MyChart username and password.  Due to Covid, a mask is required upon entering the hospital/clinic. If you do not have a mask, one will be given to  you upon arrival. For doctor visits, patients may have 1 support person aged 15 or older with them. For treatment visits, patients cannot have anyone with them due to current Covid guidelines and our immunocompromised population.

## 2020-08-31 ENCOUNTER — Telehealth: Payer: Self-pay

## 2020-08-31 ENCOUNTER — Ambulatory Visit: Admission: RE | Admit: 2020-08-31 | Payer: Medicare HMO | Source: Ambulatory Visit

## 2020-08-31 NOTE — Telephone Encounter (Signed)
Telephone call to patient for follow up after receiving first infusion.   No answer but left message stating we were calling to check on them.  Encouraged patient to call for any questions or concerns.   

## 2020-09-01 ENCOUNTER — Ambulatory Visit
Admission: RE | Admit: 2020-09-01 | Discharge: 2020-09-01 | Disposition: A | Discharge status: Home / Self Care | Payer: Medicare HMO | Source: Ambulatory Visit | Attending: Radiation Oncology | Admitting: Radiation Oncology

## 2020-09-01 DIAGNOSIS — C3491 Malignant neoplasm of unspecified part of right bronchus or lung: Secondary | ICD-10-CM | POA: Diagnosis present

## 2020-09-01 NOTE — Progress Notes (Signed)
Remote pacemaker transmission.   

## 2020-09-02 ENCOUNTER — Ambulatory Visit
Admission: RE | Admit: 2020-09-02 | Discharge: 2020-09-02 | Disposition: A | Discharge status: Home / Self Care | Payer: Medicare HMO | Source: Ambulatory Visit | Attending: Radiation Oncology | Admitting: Radiation Oncology

## 2020-09-02 DIAGNOSIS — C3491 Malignant neoplasm of unspecified part of right bronchus or lung: Secondary | ICD-10-CM | POA: Diagnosis not present

## 2020-09-02 NOTE — Progress Notes (Signed)
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  INTERVAL HISTORY: Patient returns to clinic today for further evaluation and consideration of cycle 2 of weekly carboplatinum and Taxol along with her daily XRT.  She tolerated her first infusion well without significant side effects.  She continues to have chronic weakness and fatigue, but otherwise feels well. She has no neurologic complaints.  She denies any recent fevers.  She has a poor appetite, but denies any weight loss.  She denies any chest pain, shortness of breath, hemoptysis, or cough.  She denies any nausea, vomiting, constipation, or diarrhea.  She has no urinary complaints.  Patient offers no further specific complaints today.  REVIEW OF SYSTEMS:   Review of Systems  Constitutional: Positive for malaise/fatigue. Negative for fever and weight loss.  Respiratory: Negative.  Negative for cough, hemoptysis and shortness of breath.   Cardiovascular: Negative.  Negative for chest pain and leg swelling.  Gastrointestinal: Negative.  Negative for abdominal pain.  Genitourinary: Negative.  Negative for dysuria.  Musculoskeletal: Negative.  Negative for back pain.  Skin: Negative.  Negative for rash.  Neurological: Positive for weakness. Negative for dizziness, focal weakness and headaches.  Psychiatric/Behavioral: Negative.  The patient is not nervous/anxious.     As per HPI. Otherwise, a complete review of systems is  negative.  PAST MEDICAL HISTORY: Past Medical History:  Diagnosis Date  . Breast cancer, right ([place]) 11/2018   Mastectomy  . Complete heart block ([place])   . Dyspnea   . Gait abnormality 07/23/2016  . GERD (gastroesophageal reflux disease)   . Hemoptysis 07/25/2020  . History of hypertension   . History of stroke   . Hyperhomocysteinemia ([place])   . Hyperlipidemia   . Hypertension   . Hypokalemia 07/13/2018  . Low blood magnesium 07/13/2018  . Lung cancer ([place])   . Pacemaker-Medtronic 08/07/2011  . Stroke [facility])    left sided weakness, 07/28/20- no residual   . Vomiting 07/13/2018    PAST SURGICAL HISTORY: Past Surgical History:  Procedure Laterality Date  . BIOPSY  07/15/2018   Procedure: BIOPSY;  Surgeon: [name], MD;  Location: [facility] ENDOSCOPY;  Service: Gastroenterology;;  . BREAST BIOPSY Right 10/27/2018   affirm bx of mass, x clip,  INVASIVE MAMMARY CARCINOMA  . BREAST LUMPECTOMY Right 12/03/2018  . BRONCHIAL BIOPSY  07/29/2020   Procedure: BRONCHIAL BIOPSIES;  Surgeon: Laurin Coder, MD;  Location: [facility] ENDOSCOPY;  Service: Pulmonary;;  . BRONCHIAL NEEDLE ASPIRATION BIOPSY  07/29/2020   Procedure: BRONCHIAL NEEDLE ASPIRATION BIOPSIES;  Surgeon: Laurin Coder, MD;  Location: [facility] ENDOSCOPY;  Service: Pulmonary;;  . CARDIAC CATHETERIZATION    . ENDOBRONCHIAL ULTRASOUND N/A 07/29/2020   Procedure: ENDOBRONCHIAL ULTRASOUND;  Surgeon: Laurin Coder, MD;  Location: [facility] ENDOSCOPY;  Service: Pulmonary;  Laterality: N/A;  . ESOPHAGOGASTRODUODENOSCOPY Left 07/15/2018   Procedure: ESOPHAGOGASTRODUODENOSCOPY (EGD);  Surgeon: [name], MD;  Location: [facility];  Service: Gastroenterology;  Laterality: Left;  . HEMOSTASIS CONTROL  07/29/2020   Procedure: HEMOSTASIS  CONTROL;  Surgeon: Laurin Coder, MD;  Location: [facility] ENDOSCOPY;  Service: Pulmonary;;  . PACEMAKER INSERTION     Medtronic Enpulse dual-chamber pacemaker  . PARTIAL MASTECTOMY WITH NEEDLE LOCALIZATION AND  AXILLARY SENTINEL LYMPH NODE BX Right 12/03/2018   Procedure: PARTIAL MASTECTOMY WITH NEEDLE LOCALIZATION AND AXILLARY SENTINEL LYMPH NODE BX, RIGHT;  Surgeon: [name], MD;  Location: [facility] ORS;  Service: General;  Laterality: Right;  . PERMANENT PACEMAKER GENERATOR CHANGE N/A 07/30/2013   Procedure: PERMANENT PACEMAKER GENERATOR CHANGE;  Surgeon: [name], MD;  Location: [facility] CATH LAB;  Service: Cardiovascular;  Laterality: N/A;  . PORTA CATH INSERTION N/A 08/16/2020   Procedure: PORTA CATH INSERTION;  Surgeon: Katha Cabal, MD;  Location: [facility] CV LAB;  Service: Cardiovascular;  Laterality: N/A;  . VIDEO BRONCHOSCOPY N/A 07/29/2020   Procedure: VIDEO BRONCHOSCOPY WITH FLUORO;  Surgeon: Laurin Coder, MD;  Location: [facility] ENDOSCOPY;  Service: Pulmonary;  Laterality: N/A;    FAMILY HISTORY: Family History  Problem Relation Age of Onset  . Hypertension Mother   . Breast cancer Sister   . Breast cancer Sister   . Colon cancer Neg Hx   . Esophageal cancer Neg Hx     ADVANCED DIRECTIVES (Y/N):  N  HEALTH MAINTENANCE: Social History   Tobacco Use  . Smoking status: Former Smoker    Quit date: 07/25/2006    Years since quitting: 14.1  . Smokeless tobacco: Never Used  Vaping Use  . Vaping Use: Never used  Substance Use Topics  . Alcohol use: No  . Drug use: No     Colonoscopy:  PAP:  Bone density:  Lipid panel:  No Known Allergies  Current Outpatient Medications  Medication Sig Dispense Refill  . acetaminophen (TYLENOL) 500 MG tablet Take 500-1,000 mg by mouth every 6 (six) hours as needed for moderate pain.     [name] alendronate (FOSAMAX) 70 MG tablet TAKE 1 TABLET (70 MG TOTAL) BY MOUTH ONCE A WEEK. TAKE WITH A FULL GLASS OF WATER ON AN EMPTY STOMACH. 12 tablet 2  . allopurinol (ZYLOPRIM) 100 MG tablet Take 100 mg by mouth daily.    [name] amLODipine (NORVASC) 5 MG tablet Take 5 mg by mouth daily.    . benzonatate (TESSALON) 100 MG capsule Take 100-200 mg  by mouth 3 (three) times daily as needed for cough.    . Blood Pressure KIT Automated blood pressure measuring device. 1 each 0  . clopidogrel (PLAVIX) 75 MG tablet Take 75 mg by mouth daily.    . hydrochlorothiazide (HYDRODIURIL) 25 MG tablet Take 25 mg by mouth daily.    [name] letrozole (FEMARA) 2.5 MG tablet TAKE 1 TABLET BY MOUTH EVERY DAY (Patient taking differently: Take 2.5 mg by mouth daily.) 90 tablet 3  . lidocaine-prilocaine (EMLA) cream Apply to affected area once 30 g 3  . LUMIGAN 0.01 % SOLN Place 1 drop into both eyes at bedtime.  4  . ondansetron (ZOFRAN) 8 MG tablet Take 1 tablet (8 mg total) by mouth 2 (two) times daily as needed for refractory nausea / vomiting. 60 tablet 2  . pantoprazole (PROTONIX) 40 MG tablet Take 40 mg by mouth daily.    . Potassium 99 MG TABS Take 99 mg by mouth daily.    . prochlorperazine (COMPAZINE) 10 MG tablet Take 1 tablet (10 mg total) by mouth every 6 (six) hours as needed (Nausea or vomiting). 60 tablet 2  . simvastatin (ZOCOR) 40 MG tablet Take 1  tablet (40 mg total) by mouth daily at 6 PM. 90 tablet 3   No current facility-administered medications for this visit.    OBJECTIVE: Vitals:   09/06/20 1009  BP: (!) 96/58  Pulse: 61  Resp: 18  Temp: 98.7 F (37.1 C)  SpO2: 99%     Body mass index is 26.89 kg/m.    ECOG FS:0 - Asymptomatic  General: Well-developed, well-nourished, no acute distress.  Sitting in a wheelchair. Eyes: Pink conjunctiva, anicteric sclera. HEENT: Normocephalic, moist mucous membranes. Lungs: No audible wheezing or coughing. Heart: Regular rate and rhythm. Abdomen: Soft, nontender, no obvious distention. Musculoskeletal: No edema, cyanosis, or clubbing. Neuro: Alert, answering all questions appropriately. Cranial nerves grossly intact. Skin: No rashes or petechiae noted. Psych: Normal affect.   LAB RESULTS:  Lab Results  Component Value Date   NA 136 09/06/2020   K 3.0 (L) 09/06/2020   CL 99 09/06/2020    CO2 27 09/06/2020   GLUCOSE 103 (H) 09/06/2020   BUN 10 09/06/2020   CREATININE 0.88 09/06/2020   CALCIUM 8.8 (L) 09/06/2020   PROT 7.5 09/06/2020   ALBUMIN 2.7 (L) 09/06/2020   AST 13 (L) 09/06/2020   ALT 9 09/06/2020   ALKPHOS 62 09/06/2020   BILITOT 0.8 09/06/2020   GFRNONAA >60 09/06/2020   GFRAA >60 07/24/2018    Lab Results  Component Value Date   WBC 3.1 (L) 09/06/2020   NEUTROABS 2.0 09/06/2020   HGB 8.9 (L) 09/06/2020   HCT 29.2 (L) 09/06/2020   MCV 84.9 09/06/2020   PLT 282 09/06/2020     STUDIES: CT Head W Wo Contrast  Result Date: 08/15/2020 CLINICAL DATA:  Staging non-small cell lung cancer. EXAM: CT HEAD WITHOUT AND WITH CONTRAST TECHNIQUE: Contiguous axial images were obtained from the base of the skull through the vertex without and with intravenous contrast CONTRAST:  75m OMNIPAQUE IOHEXOL 300 MG/ML  SOLN COMPARISON:  Head CT 07/15/2018 and 05/26/2018. FINDINGS: Brain: No focal abnormality affects brainstem. Old infarction of the inferior cerebellum on the left. Cerebral hemispheres show old infarction in the right basal ganglia and radiating white matter tracts and chronic small-vessel change of the hemispheric white matter. No large vessel territory infarction. No mass lesion, hemorrhage, hydrocephalus or extra-axial collection. No other cause of abnormal enhancement is identified. Vascular: There is atherosclerotic calcification of the major vessels at the base of the brain. Skull: Negative Sinuses/Orbits: Clear/normal Other: None IMPRESSION: No evidence of metastatic disease. Old infarction in the right basal ganglia and radiating white matter tracts. Old infarction of the inferior cerebellum on the left. Chronic small-vessel change of the hemispheric white matter. Electronically Signed   By: MNelson ChimesM.D.   On: 08/15/2020 15:09   PERIPHERAL VASCULAR CATHETERIZATION  Result Date: 08/16/2020 See Op Note  NM PET Image Initial (PI) Skull Base To  Thigh  Result Date: 08/15/2020 CLINICAL DATA:  Initial treatment strategy for lung cancer. EXAM: NUCLEAR MEDICINE PET SKULL BASE TO THIGH TECHNIQUE: 8.6 mCi F-18 FDG was injected intravenously. Full-ring PET imaging was performed from the skull base to thigh after the radiotracer. CT data was obtained and used for attenuation correction and anatomic localization. Fasting blood glucose: 86 mg/dl COMPARISON:  CT abdomen pelvis 07/28/2010, CT chest 07/25/2020. FINDINGS: Mediastinal blood pool activity: SUV max 2.2 Liver activity: SUV max NA NECK: No abnormal hypermetabolism. Incidental CT findings: None. CHEST: Markedly hypermetabolic right hilar nodal mass measures approximately 3.1 cm with an SUV max of 26.0. A central mass in  the right hemithorax obstructs the right middle and right lower lobe bronchi, with an SUV max of 31.6. Measurement is difficult without IV contrast and due to associated hypermetabolic postobstructive collapse/consolidation. No hypermetabolic mediastinal or axillary lymph nodes. Incidental CT findings: Atherosclerotic calcification of the aorta, aortic valve and coronary arteries. Heart is enlarged. No pericardial effusion. Separate nodule in the peripheral right lower lobe measures 1.3 cm (4/87) but does not show abnormal hypermetabolism. ABDOMEN/PELVIS: No abnormal hypermetabolism in the liver, adrenal glands, spleen or pancreas. No hypermetabolic lymph nodes. Incidental CT findings: Visualized portion of the liver is unremarkable. Stones are seen in the gallbladder. Adrenal glands are unremarkable. Mixed attenuation mass in the lower pole right kidney contains fat, measuring 2.6 cm. Additional low-attenuation lesions in both kidneys are better seen on 07/27/2020. Stones in the kidneys bilaterally. Spleen, pancreas, stomach and bowel are grossly unremarkable. Atherosclerotic calcification of the aorta. Bladder is low in volume. SKELETON: No abnormal hypermetabolism. Incidental CT findings:  Degenerative changes in the spine. IMPRESSION: 1. Hypermetabolic central right lung mass with obstruction of the right middle and right lower lobe bronchi, associated postobstructive collapse/consolidation and hypermetabolic right hilar adenopathy, findings indicative of at least T2 N1 M0 or stage IB-IIA primary bronchogenic carcinoma. 2. Cholelithiasis. 3. Right renal angiomyolipoma. 4. Bilateral renal stones. 5. Aortic atherosclerosis (ICD10-I70.0). Coronary artery calcification. Electronically Signed   By: Lorin Picket M.D.   On: 08/15/2020 16:25   CUP PACEART REMOTE DEVICE CHECK  Result Date: 08/17/2020 Scheduled remote reviewed. A burden 13.3%; longest AHR 24:26 minutes EGMs suggestive AT vs AF.  1 VHR event 8 beats max v rate 274bpm.  Histogram controlled.  Normal device function.  Forwarding to triage for further review.  R. Powers, CVRS Next remote 91 days.   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  PLAN:    1.  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  CT scan results from July 25, 2020 reviewed independently and reported as above.  Patient subsequently underwent bronchoscopy on July 29, 2020 confirming the diagnosis.  PET scan results from August 15, 2020 reviewed independently and reported as above confirming stage of disease.  CT of the head did not reveal any metastatic lesions.  Plan to give weekly carboplatinum and Taxol with concurrent XRT followed by maintenance immunotherapy for 1 year.  Patient has had port placement.  Proceed with cycle 2 of weekly carboplatinum and Taxol today.  Continue daily XRT.  Return to clinic in 1 week for further evaluation and consideration of cycle 3.   2.  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  Given her advanced age and small size of malignancy, Oncotype DX or adjuvant chemotherapy was not necessary.  She was also evaluated by radiation oncology who determined that adjuvant XRT was also not needed.  Continue letrozole for a total of 5 years completing treatment in August 2025.  Although we will likely discontinue treatment while she is receiving chemotherapy for her lung cancer.  Her most recent mammogram on November 13, 2019 was reported as BI-RADS 2, repeat in July 2022.    2.  Osteoporosis: Patient's most recent bone mineral density on January 14, 2020 reported T score of -2.6 which is mildly improved over 1  year prior where her T score was reported at -2.8.  She is tolerating letrozole well so we will continue this medication as prescribed.  Continue Fosamax, calcium, and vitamin D supplementation.  Repeat bone mineral density in September 2022.   3.  Anemia: Hemoglobin has trended down to 8.9, monitor. 4.  Hemoptysis: Resolved. 5.  Hypokalemia: Potassium level has decreased to 3.0.  Patient will receive 20 mEq IV potassium and encouraged to take her potassium supplement regularly. 6.  Weight loss: Patient does not wish dietary referral at this time, but if she continues to have a poor appetite would reconsider in the future. 7.  Leukopenia: Mild, monitor.    Patient expressed understanding and was in agreement with this plan. She also understands that She can call clinic at any time with any questions, concerns, or complaints.   Cancer Staging Malignant neoplasm of upper-outer quadrant of right breast in female, estrogen receptor positive ([place]) Staging form: Breast, AJCC 8th Edition - Clinical stage from 11/06/2018: Stage IA (cT1a, cN0, cM0, G1, ER+, PR+, HER2-) - Signed by [name], MD on 11/06/2018 Histologic grading system: 3 grade system  Squamous cell carcinoma of right lung ([place]) Staging form: Lung, AJCC 8th Edition -  Pathologic stage from 08/05/2020: Stage IIIA (pT3, pN1, cM0) - Signed by [name], MD on 08/05/2020 Stage prefix: Initial diagnosis   [name], MD   09/07/2020 6:21 AM

## 2020-09-05 ENCOUNTER — Ambulatory Visit
Admission: RE | Admit: 2020-09-05 | Discharge: 2020-09-05 | Disposition: A | Discharge status: Home / Self Care | Payer: Medicare HMO | Source: Ambulatory Visit | Attending: Radiation Oncology | Admitting: Radiation Oncology

## 2020-09-05 DIAGNOSIS — C3491 Malignant neoplasm of unspecified part of right bronchus or lung: Secondary | ICD-10-CM | POA: Diagnosis not present

## 2020-09-06 ENCOUNTER — Inpatient Hospital Stay: Payer: Medicare HMO

## 2020-09-06 ENCOUNTER — Ambulatory Visit
Admission: RE | Admit: 2020-09-06 | Discharge: 2020-09-06 | Disposition: A | Discharge status: Home / Self Care | Payer: Medicare HMO | Source: Ambulatory Visit | Attending: Radiation Oncology | Admitting: Radiation Oncology

## 2020-09-06 ENCOUNTER — Inpatient Hospital Stay (HOSPITAL_BASED_OUTPATIENT_CLINIC_OR_DEPARTMENT_OTHER): Payer: Medicare HMO | Admitting: Oncology

## 2020-09-06 ENCOUNTER — Encounter: Payer: Self-pay | Admitting: Oncology

## 2020-09-06 ENCOUNTER — Other Ambulatory Visit: Payer: Self-pay

## 2020-09-06 VITALS — BP 96/58 | HR 61 | Temp 98.7°F | Resp 18 | Ht 62.0 in | Wt 147.0 lb

## 2020-09-06 VITALS — BP 150/79 | HR 68 | Resp 19

## 2020-09-06 DIAGNOSIS — Z5111 Encounter for antineoplastic chemotherapy: Secondary | ICD-10-CM | POA: Diagnosis not present

## 2020-09-06 DIAGNOSIS — C3491 Malignant neoplasm of unspecified part of right bronchus or lung: Secondary | ICD-10-CM

## 2020-09-06 DIAGNOSIS — E876 Hypokalemia: Secondary | ICD-10-CM

## 2020-09-06 LAB — CBC WITH DIFFERENTIAL/PLATELET
Abs Immature Granulocytes: 0.02 10*3/uL (ref 0.00–0.07)
Basophils Absolute: 0 10*3/uL (ref 0.0–0.1)
Basophils Relative: 1 %
Eosinophils Absolute: 0.1 10*3/uL (ref 0.0–0.5)
Eosinophils Relative: 2 %
HCT: 29.2 % — ABNORMAL LOW (ref 36.0–46.0)
Hemoglobin: 8.9 g/dL — ABNORMAL LOW (ref 12.0–15.0)
Immature Granulocytes: 1 %
Lymphocytes Relative: 25 %
Lymphs Abs: 0.8 10*3/uL (ref 0.7–4.0)
MCH: 25.9 pg — ABNORMAL LOW (ref 26.0–34.0)
MCHC: 30.5 g/dL (ref 30.0–36.0)
MCV: 84.9 fL (ref 80.0–100.0)
Monocytes Absolute: 0.2 10*3/uL (ref 0.1–1.0)
Monocytes Relative: 8 %
Neutro Abs: 2 10*3/uL (ref 1.7–7.7)
Neutrophils Relative %: 63 %
Platelets: 282 10*3/uL (ref 150–400)
RBC: 3.44 MIL/uL — ABNORMAL LOW (ref 3.87–5.11)
RDW: 16 % — ABNORMAL HIGH (ref 11.5–15.5)
WBC: 3.1 10*3/uL — ABNORMAL LOW (ref 4.0–10.5)
nRBC: 0 % (ref 0.0–0.2)

## 2020-09-06 LAB — COMPREHENSIVE METABOLIC PANEL
ALT: 9 U/L (ref 0–44)
AST: 13 U/L — ABNORMAL LOW (ref 15–41)
Albumin: 2.7 g/dL — ABNORMAL LOW (ref 3.5–5.0)
Alkaline Phosphatase: 62 U/L (ref 38–126)
Anion gap: 10 (ref 5–15)
BUN: 10 mg/dL (ref 8–23)
CO2: 27 mmol/L (ref 22–32)
Calcium: 8.8 mg/dL — ABNORMAL LOW (ref 8.9–10.3)
Chloride: 99 mmol/L (ref 98–111)
Creatinine, Ser: 0.88 mg/dL (ref 0.44–1.00)
GFR, Estimated: >60 mL/min (ref >60)
Glucose, Bld: 103 mg/dL — ABNORMAL HIGH (ref 70–99)
Potassium: 3 mmol/L — ABNORMAL LOW (ref 3.5–5.1)
Sodium: 136 mmol/L (ref 135–145)
Total Bilirubin: 0.8 mg/dL (ref 0.3–1.2)
Total Protein: 7.5 g/dL (ref 6.5–8.1)

## 2020-09-06 MED ORDER — PACLITAXEL CHEMO INJECTION 300 MG/50ML
45.0000 mg/m2 | Freq: Once | INTRAVENOUS | Status: AC
  Administered 2020-09-06: 78 mg via INTRAVENOUS
  Filled 2020-09-06: qty 13

## 2020-09-06 MED ORDER — HEPARIN SOD (PORK) LOCK FLUSH 100 UNIT/ML IV SOLN
500.0000 [IU] | Freq: Once | INTRAVENOUS | Status: AC | PRN
  Administered 2020-09-06: 500 [IU]
  Filled 2020-09-06: qty 5

## 2020-09-06 MED ORDER — POTASSIUM CHLORIDE IN NACL 20-0.9 MEQ/L-% IV SOLN
Freq: Once | INTRAVENOUS | Status: AC
  Filled 2020-09-06: qty 1000

## 2020-09-06 MED ORDER — SODIUM CHLORIDE 0.9 % IV SOLN
139.0000 mg | Freq: Once | INTRAVENOUS | Status: AC
  Administered 2020-09-06: 140 mg via INTRAVENOUS
  Filled 2020-09-06: qty 14

## 2020-09-06 MED ORDER — FAMOTIDINE 20 MG IN NS 100 ML IVPB
20.0000 mg | Freq: Once | INTRAVENOUS | Status: AC
  Administered 2020-09-06: 20 mg via INTRAVENOUS
  Filled 2020-09-06: qty 20

## 2020-09-06 MED ORDER — SODIUM CHLORIDE 0.9 % IV SOLN
Freq: Once | INTRAVENOUS | Status: AC
  Filled 2020-09-06: qty 250

## 2020-09-06 MED ORDER — SODIUM CHLORIDE 0.9 % IV SOLN
10.0000 mg | Freq: Once | INTRAVENOUS | Status: AC
  Administered 2020-09-06: 10 mg via INTRAVENOUS
  Filled 2020-09-06: qty 1

## 2020-09-06 MED ORDER — HEPARIN SOD (PORK) LOCK FLUSH 100 UNIT/ML IV SOLN
INTRAVENOUS | Status: AC
  Filled 2020-09-06: qty 5

## 2020-09-06 MED ORDER — PALONOSETRON HCL INJECTION 0.25 MG/5ML
0.2500 mg | Freq: Once | INTRAVENOUS | Status: AC
  Administered 2020-09-06: 0.25 mg via INTRAVENOUS
  Filled 2020-09-06: qty 5

## 2020-09-06 MED ORDER — DIPHENHYDRAMINE HCL 50 MG/ML IJ SOLN
25.0000 mg | Freq: Once | INTRAMUSCULAR | Status: AC
  Administered 2020-09-06: 25 mg via INTRAVENOUS
  Filled 2020-09-06: qty 1

## 2020-09-06 NOTE — Patient Instructions (Signed)
[facility]   Discharge Instructions: Thank you for choosing [facility] to provide your oncology and hematology care.  If you have a lab appointment with the [facility], please go directly to the [facility] and check in at the registration area.  Wear comfortable clothing and clothing appropriate for easy access to any Portacath or PICC line.   We strive to give you quality time with your provider. You may need to reschedule your appointment if you arrive late (15 or more minutes).  Arriving late affects you and other patients whose appointments are after yours.  Also, if you miss three or more appointments without notifying the office, you may be dismissed from the clinic at the provider's discretion.      For prescription refill requests, have your pharmacy contact our office and allow 72 hours for refills to be completed.    Today you received the following chemotherapy and/or immunotherapy agents Taxol, Carboplatin  Carboplatin injection What is this medicine? CARBOPLATIN (KAR boe pla tin) is a chemotherapy drug. It targets fast dividing cells, like cancer cells, and causes these cells to die. This medicine is used to treat ovarian cancer and many other cancers. This medicine may be used for other purposes; ask your health care provider or pharmacist if you have questions. COMMON BRAND NAME(S): Paraplatin What should I tell my health care provider before I take this medicine? They need to know if you have any of these conditions:  blood disorders  hearing problems  kidney disease  recent or ongoing radiation therapy  an unusual or allergic reaction to carboplatin, cisplatin, other chemotherapy, other medicines, foods, dyes, or preservatives  pregnant or trying to get pregnant  breast-feeding How should I use this medicine? This drug is usually given as an infusion into a vein. It is administered in a hospital or clinic by  a specially trained health care professional. Talk to your pediatrician regarding the use of this medicine in children. Special care may be needed. Overdosage: If you think you have taken too much of this medicine contact a poison control center or emergency room at once. NOTE: This medicine is only for you. Do not share this medicine with others. What if I miss a dose? It is important not to miss a dose. Call your doctor or health care professional if you are unable to keep an appointment. What may interact with this medicine?  medicines for seizures  medicines to increase blood counts like filgrastim, pegfilgrastim, sargramostim  some antibiotics like amikacin, gentamicin, neomycin, streptomycin, tobramycin  vaccines Talk to your doctor or health care professional before taking any of these medicines:  acetaminophen  aspirin  ibuprofen  ketoprofen  naproxen This list may not describe all possible interactions. Give your health care provider a list of all the medicines, herbs, non-prescription drugs, or dietary supplements you use. Also tell them if you smoke, drink alcohol, or use illegal drugs. Some items may interact with your medicine. What should I watch for while using this medicine? Your condition will be monitored carefully while you are receiving this medicine. You will need important blood work done while you are taking this medicine. This drug may make you feel generally unwell. This is not uncommon, as chemotherapy can affect healthy cells as well as cancer cells. Report any side effects. Continue your course of treatment even though you feel ill unless your doctor tells you to stop. In some cases, you may be given additional  medicines to help with side effects. Follow all directions for their use. Call your doctor or health care professional for advice if you get a fever, chills or sore throat, or other symptoms of a cold or flu. Do not treat yourself. This drug decreases  your body's ability to fight infections. Try to avoid being around people who are sick. This medicine may increase your risk to bruise or bleed. Call your doctor or health care professional if you notice any unusual bleeding. Be careful brushing and flossing your teeth or using a toothpick because you may get an infection or bleed more easily. If you have any dental work done, tell your dentist you are receiving this medicine. Avoid taking products that contain aspirin, acetaminophen, ibuprofen, naproxen, or ketoprofen unless instructed by your doctor. These medicines may hide a fever. Do not become pregnant while taking this medicine. Women should inform their doctor if they wish to become pregnant or think they might be pregnant. There is a potential for serious side effects to an unborn child. Talk to your health care professional or pharmacist for more information. Do not breast-feed an infant while taking this medicine. What side effects may I notice from receiving this medicine? Side effects that you should report to your doctor or health care professional as soon as possible:  allergic reactions like skin rash, itching or hives, swelling of the face, lips, or tongue  signs of infection - fever or chills, cough, sore throat, pain or difficulty passing urine  signs of decreased platelets or bleeding - bruising, pinpoint red spots on the skin, black, tarry stools, nosebleeds  signs of decreased red blood cells - unusually weak or tired, fainting spells, lightheadedness  breathing problems  changes in hearing  changes in vision  chest pain  high blood pressure  low blood counts - This drug may decrease the number of white blood cells, red blood cells and platelets. You may be at increased risk for infections and bleeding.  nausea and vomiting  pain, swelling, redness or irritation at the injection site  pain, tingling, numbness in the hands or feet  problems with balance,  talking, walking  trouble passing urine or change in the amount of urine Side effects that usually do not require medical attention (report to your doctor or health care professional if they continue or are bothersome):  hair loss  loss of appetite  metallic taste in the mouth or changes in taste This list may not describe all possible side effects. Call your doctor for medical advice about side effects. You may report side effects to FDA at 1-800-FDA-1088. Where should I keep my medicine? This drug is given in a hospital or clinic and will not be stored at home. NOTE: This sheet is a summary. It may not cover all possible information. If you have questions about this medicine, talk to your doctor, pharmacist, or health care provider.  2021 Elsevier/Gold Standard (2007-07-22 14:38:05)  Paclitaxel injection What is this medicine? PACLITAXEL (PAK li TAX el) is a chemotherapy drug. It targets fast dividing cells, like cancer cells, and causes these cells to die. This medicine is used to treat ovarian cancer, breast cancer, lung cancer, Kaposi's sarcoma, and other cancers. This medicine may be used for other purposes; ask your health care provider or pharmacist if you have questions. COMMON BRAND NAME(S): Onxol, Taxol What should I tell my health care provider before I take this medicine? They need to know if you have any of these conditions:  history of irregular heartbeat  liver disease  low blood counts, like low white cell, platelet, or red cell counts  lung or breathing disease, like asthma  tingling of the fingers or toes, or other nerve disorder  an unusual or allergic reaction to paclitaxel, alcohol, polyoxyethylated castor oil, other chemotherapy, other medicines, foods, dyes, or preservatives  pregnant or trying to get pregnant  breast-feeding How should I use this medicine? This drug is given as an infusion into a vein. It is administered in a hospital or clinic by a  specially trained health care professional. Talk to your pediatrician regarding the use of this medicine in children. Special care may be needed. Overdosage: If you think you have taken too much of this medicine contact a poison control center or emergency room at once. NOTE: This medicine is only for you. Do not share this medicine with others. What if I miss a dose? It is important not to miss your dose. Call your doctor or health care professional if you are unable to keep an appointment. What may interact with this medicine? Do not take this medicine with any of the following medications:  live virus vaccines This medicine may also interact with the following medications:  antiviral medicines for hepatitis, HIV or AIDS  certain antibiotics like erythromycin and clarithromycin  certain medicines for fungal infections like ketoconazole and itraconazole  certain medicines for seizures like carbamazepine, phenobarbital, phenytoin  gemfibrozil  nefazodone  rifampin  St. John's wort This list may not describe all possible interactions. Give your health care provider a list of all the medicines, herbs, non-prescription drugs, or dietary supplements you use. Also tell them if you smoke, drink alcohol, or use illegal drugs. Some items may interact with your medicine. What should I watch for while using this medicine? Your condition will be monitored carefully while you are receiving this medicine. You will need important blood work done while you are taking this medicine. This medicine can cause serious allergic reactions. To reduce your risk you will need to take other medicine(s) before treatment with this medicine. If you experience allergic reactions like skin rash, itching or hives, swelling of the face, lips, or tongue, tell your doctor or health care professional right away. In some cases, you may be given additional medicines to help with side effects. Follow all directions for  their use. This drug may make you feel generally unwell. This is not uncommon, as chemotherapy can affect healthy cells as well as cancer cells. Report any side effects. Continue your course of treatment even though you feel ill unless your doctor tells you to stop. Call your doctor or health care professional for advice if you get a fever, chills or sore throat, or other symptoms of a cold or flu. Do not treat yourself. This drug decreases your body's ability to fight infections. Try to avoid being around people who are sick. This medicine may increase your risk to bruise or bleed. Call your doctor or health care professional if you notice any unusual bleeding. Be careful brushing and flossing your teeth or using a toothpick because you may get an infection or bleed more easily. If you have any dental work done, tell your dentist you are receiving this medicine. Avoid taking products that contain aspirin, acetaminophen, ibuprofen, naproxen, or ketoprofen unless instructed by your doctor. These medicines may hide a fever. Do not become pregnant while taking this medicine. Women should inform their doctor if they wish to become pregnant or think  they might be pregnant. There is a potential for serious side effects to an unborn child. Talk to your health care professional or pharmacist for more information. Do not breast-feed an infant while taking this medicine. Men are advised not to father a child while receiving this medicine. This product may contain alcohol. Ask your pharmacist or healthcare provider if this medicine contains alcohol. Be sure to tell all healthcare providers you are taking this medicine. Certain medicines, like metronidazole and disulfiram, can cause an unpleasant reaction when taken with alcohol. The reaction includes flushing, headache, nausea, vomiting, sweating, and increased thirst. The reaction can last from 30 minutes to several hours. What side effects may I notice from  receiving this medicine? Side effects that you should report to your doctor or health care professional as soon as possible:  allergic reactions like skin rash, itching or hives, swelling of the face, lips, or tongue  breathing problems  changes in vision  fast, irregular heartbeat  high or low blood pressure  mouth sores  pain, tingling, numbness in the hands or feet  signs of decreased platelets or bleeding - bruising, pinpoint red spots on the skin, black, tarry stools, blood in the urine  signs of decreased red blood cells - unusually weak or tired, feeling faint or lightheaded, falls  signs of infection - fever or chills, cough, sore throat, pain or difficulty passing urine  signs and symptoms of liver injury like dark yellow or brown urine; general ill feeling or flu-like symptoms; light-colored stools; loss of appetite; nausea; right upper belly pain; unusually weak or tired; yellowing of the eyes or skin  swelling of the ankles, feet, hands  unusually slow heartbeat Side effects that usually do not require medical attention (report to your doctor or health care professional if they continue or are bothersome):  diarrhea  hair loss  loss of appetite  muscle or joint pain  nausea, vomiting  pain, redness, or irritation at site where injected  tiredness This list may not describe all possible side effects. Call your doctor for medical advice about side effects. You may report side effects to FDA at 1-800-FDA-1088. Where should I keep my medicine? This drug is given in a hospital or clinic and will not be stored at home. NOTE: This sheet is a summary. It may not cover all possible information. If you have questions about this medicine, talk to your doctor, pharmacist, or health care provider.  2021 Elsevier/Gold Standard (2019-03-18 13:37:23)        To help prevent nausea and vomiting after your treatment, we encourage you to take your nausea medication as  directed.  BELOW ARE SYMPTOMS THAT SHOULD BE REPORTED IMMEDIATELY: . *FEVER GREATER THAN 100.4 F (38 C) OR HIGHER . *CHILLS OR SWEATING . *NAUSEA AND VOMITING THAT IS NOT CONTROLLED WITH YOUR NAUSEA MEDICATION . *UNUSUAL SHORTNESS OF BREATH . *UNUSUAL BRUISING OR BLEEDING . *URINARY PROBLEMS (pain or burning when urinating, or frequent urination) . *BOWEL PROBLEMS (unusual diarrhea, constipation, pain near the anus) . TENDERNESS IN MOUTH AND THROAT WITH OR WITHOUT PRESENCE OF ULCERS (sore throat, sores in mouth, or a toothache) . UNUSUAL RASH, SWELLING OR PAIN  . UNUSUAL VAGINAL DISCHARGE OR ITCHING   Items with * indicate a potential emergency and should be followed up as soon as possible or go to the Emergency Department if any problems should occur.  Please show the CHEMOTHERAPY ALERT CARD or IMMUNOTHERAPY ALERT CARD at check-in to the Emergency Department and triage nurse.  Should you have questions after your visit or need to cancel or reschedule your appointment, please contact [facility]  [phone] and follow the prompts.  Office hours are 8:00 a.m. to 4:30 p.m. Monday - Friday. Please note that voicemails left after 4:00 p.m. may not be returned until the following business day.  We are closed weekends and major holidays. You have access to a nurse at all times for urgent questions. Please call the main number to the clinic [phone] and follow the prompts.  For any non-urgent questions, you may also contact your provider using MyChart. We now offer e-Visits for anyone 50 and older to request care online for non-urgent symptoms. For details visit [email or web address].   Also download the MyChart app! Go to the app store, search "MyChart", open the app, select [facility], and log in with your MyChart username and password.  Due to Covid, a mask is required upon entering the hospital/clinic. If you do not have a mask, one will be given to  you upon arrival. For doctor visits, patients may have 1 support person aged 56 or older with them. For treatment visits, patients cannot have anyone with them due to current Covid guidelines and our immunocompromised population.

## 2020-09-06 NOTE — Progress Notes (Signed)
MD aware of pt.'s VS and labs. Per MD to continue with treatment. Pt tolerated Taxol and Carboplatin infusion well with no signs of complications. VSS. [name] RN educated pt on the importance of notifying the clinic if any complications occur at home, pt verbalized understanding. Pt stable for discharge.   Kyleena Scheirer [employer]

## 2020-09-07 ENCOUNTER — Ambulatory Visit
Admission: RE | Admit: 2020-09-07 | Discharge: 2020-09-07 | Disposition: A | Discharge status: Home / Self Care | Payer: Medicare HMO | Source: Ambulatory Visit | Attending: Radiation Oncology | Admitting: Radiation Oncology

## 2020-09-07 DIAGNOSIS — C3491 Malignant neoplasm of unspecified part of right bronchus or lung: Secondary | ICD-10-CM | POA: Diagnosis not present

## 2020-09-08 ENCOUNTER — Ambulatory Visit
Admission: RE | Admit: 2020-09-08 | Discharge: 2020-09-08 | Disposition: A | Discharge status: Home / Self Care | Payer: Medicare HMO | Source: Ambulatory Visit | Attending: Radiation Oncology | Admitting: Radiation Oncology

## 2020-09-08 DIAGNOSIS — C3491 Malignant neoplasm of unspecified part of right bronchus or lung: Secondary | ICD-10-CM | POA: Diagnosis not present

## 2020-09-08 NOTE — Progress Notes (Signed)
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  INTERVAL HISTORY: Patient returns to clinic today for further evaluation and consideration of cycle 3 of weekly carboplatin and Taxol.  She continues with daily XRT.  She is tolerating infusions well without significant side effects.  She continues to have chronic weakness and fatigue.  She has no neurologic complaints.  She denies any recent fevers.  She continues to have a poor appetite, but denies any weight loss.  She denies any chest pain, shortness of breath, hemoptysis, or cough.  She denies any nausea, vomiting, constipation, or diarrhea.  She has no urinary complaints.  Patient offers no further specific complaints today.  REVIEW OF SYSTEMS:   Review of Systems  Constitutional: Positive for malaise/fatigue. Negative for fever and weight loss.  Respiratory: Negative.  Negative for cough, hemoptysis and shortness of breath.   Cardiovascular: Negative.  Negative for chest pain and leg swelling.  Gastrointestinal: Negative.  Negative for abdominal pain.  Genitourinary: Negative.  Negative for dysuria.  Musculoskeletal: Negative.  Negative for back pain.  Skin: Negative.  Negative for rash.  Neurological: Positive for weakness. Negative for dizziness, focal weakness and headaches.  Psychiatric/Behavioral: Negative.  The patient is not nervous/anxious.     As per HPI. Otherwise, a complete review of systems is  negative.  PAST MEDICAL HISTORY: Past Medical History:  Diagnosis Date  . Breast cancer, right ([place]) 11/2018   Mastectomy  . Complete heart block ([place])   . Dyspnea   . Gait abnormality 07/23/2016  . GERD (gastroesophageal reflux disease)   . Hemoptysis 07/25/2020  . History of hypertension   . History of stroke   . Hyperhomocysteinemia ([place])   . Hyperlipidemia   . Hypertension   . Hypokalemia 07/13/2018  . Low blood magnesium 07/13/2018  . Lung cancer ([place])   . Pacemaker-Medtronic 08/07/2011  . Stroke [facility])    left sided weakness, 07/28/20- no residual   . Vomiting 07/13/2018    PAST SURGICAL HISTORY: Past Surgical History:  Procedure Laterality Date  . BIOPSY  07/15/2018   Procedure: BIOPSY;  Surgeon: [name], MD;  Location: [facility];  Service: Gastroenterology;;  . BREAST BIOPSY Right 10/27/2018   affirm bx of mass, x clip,  INVASIVE MAMMARY CARCINOMA  . BREAST LUMPECTOMY Right 12/03/2018  . BRONCHIAL BIOPSY  07/29/2020   Procedure: BRONCHIAL BIOPSIES;  Surgeon: Laurin Coder, MD;  Location: [facility];  Service: Pulmonary;;  . BRONCHIAL NEEDLE ASPIRATION BIOPSY  07/29/2020   Procedure: BRONCHIAL NEEDLE ASPIRATION BIOPSIES;  Surgeon: Laurin Coder, MD;  Location: [facility];  Service: Pulmonary;;  . CARDIAC CATHETERIZATION    . ENDOBRONCHIAL ULTRASOUND N/A 07/29/2020   Procedure: ENDOBRONCHIAL ULTRASOUND;  Surgeon: Laurin Coder, MD;  Location: [facility];  Service: Pulmonary;  Laterality: N/A;  . ESOPHAGOGASTRODUODENOSCOPY Left 07/15/2018   Procedure: ESOPHAGOGASTRODUODENOSCOPY (EGD);  Surgeon: [name], MD;  Location: [facility];  Service: Gastroenterology;  Laterality: Left;  . HEMOSTASIS CONTROL  07/29/2020   Procedure: HEMOSTASIS CONTROL;  Surgeon: Laurin Coder, MD;  Location: [facility];  Service: Pulmonary;;  . PACEMAKER INSERTION     Medtronic Enpulse dual-chamber pacemaker  . PARTIAL MASTECTOMY WITH NEEDLE LOCALIZATION AND  AXILLARY SENTINEL LYMPH NODE BX Right 12/03/2018   Procedure: PARTIAL MASTECTOMY WITH NEEDLE LOCALIZATION AND AXILLARY SENTINEL LYMPH NODE BX, RIGHT;  Surgeon: [name], MD;  Location: [facility]S;  Service: General;  Laterality: Right;  . PERMANENT PACEMAKER GENERATOR CHANGE N/A 07/30/2013   Procedure: PERMANENT PACEMAKER GENERATOR CHANGE;  Surgeon: [name], MD;  Location: [facility] CATH LAB;  Service: Cardiovascular;  Laterality: N/A;  . PORTA CATH INSERTION N/A 08/16/2020   Procedure: PORTA CATH INSERTION;  Surgeon: Katha Cabal, MD;  Location: [facility] CV LAB;  Service: Cardiovascular;  Laterality: N/A;  . VIDEO BRONCHOSCOPY N/A 07/29/2020   Procedure: VIDEO BRONCHOSCOPY WITH FLUORO;  Surgeon: Laurin Coder, MD;  Location: [facility];  Service: Pulmonary;  Laterality: N/A;    FAMILY HISTORY: Family History  Problem Relation Age of Onset  . Hypertension Mother   . Breast cancer Sister   . Breast cancer Sister   . Colon cancer Neg Hx   . Esophageal cancer Neg Hx     ADVANCED DIRECTIVES (Y/N):  N  HEALTH MAINTENANCE: Social History   Tobacco Use  . Smoking status: Former Smoker    Quit date: 07/25/2006    Years since quitting: 14.1  . Smokeless tobacco: Never Used  Vaping Use  . Vaping Use: Never used  Substance Use Topics  . Alcohol use: No  . Drug use: No     Colonoscopy:  PAP:  Bone density:  Lipid panel:  No Known Allergies  Current Outpatient Medications  Medication Sig Dispense Refill  . acetaminophen (TYLENOL) 500 MG tablet Take 500-1,000 mg by mouth every 6 (six) hours as needed for moderate pain.     [name] alendronate (FOSAMAX) 70 MG tablet TAKE 1 TABLET (70 MG TOTAL) BY MOUTH ONCE A WEEK. TAKE WITH A FULL GLASS OF WATER ON AN EMPTY STOMACH. 12 tablet 2  . allopurinol (ZYLOPRIM) 100 MG tablet Take 100 mg by mouth daily.    [name] amLODipine (NORVASC) 5 MG tablet Take 5 mg by mouth daily.    . benzonatate (TESSALON) 100 MG capsule Take 100-200 mg  by mouth 3 (three) times daily as needed for cough.    . Blood Pressure KIT Automated blood pressure measuring device. 1 each 0  . clopidogrel (PLAVIX) 75 MG tablet Take 75 mg by mouth daily.    . hydrochlorothiazide (HYDRODIURIL) 25 MG tablet Take 25 mg by mouth daily.    [name] letrozole (FEMARA) 2.5 MG tablet TAKE 1 TABLET BY MOUTH EVERY DAY (Patient taking differently: Take 2.5 mg by mouth daily.) 90 tablet 3  . lidocaine-prilocaine (EMLA) cream Apply to affected area once 30 g 3  . LUMIGAN 0.01 % SOLN Place 1 drop into both eyes at bedtime.  4  . megestrol (MEGACE) 40 MG tablet Take 1 tablet (40 mg total) by mouth daily. 30 tablet 2  . ondansetron (ZOFRAN) 8 MG tablet Take 1 tablet (8 mg total) by mouth 2 (two) times daily as needed for refractory nausea / vomiting. 60 tablet 2  . pantoprazole (PROTONIX) 40 MG tablet Take 40 mg by mouth daily.    . Potassium 99 MG TABS Take 99 mg by mouth daily.    . prochlorperazine (COMPAZINE) 10 MG tablet Take 1 tablet (10 mg total) by mouth every 6 (six) hours  as needed (Nausea or vomiting). 60 tablet 2  . simvastatin (ZOCOR) 40 MG tablet Take 1 tablet (40 mg total) by mouth daily at 6 PM. 90 tablet 3   No current facility-administered medications for this visit.   Facility-Administered Medications Ordered in Other Visits  Medication Dose Route Frequency Provider Last Rate Last Admin  . heparin lock flush 100 unit/mL  500 Units Intravenous Once [name], MD      . sodium chloride flush (NS) 0.9 % injection 10 mL  10 mL Intravenous PRN [name], MD   10 mL at 09/13/20 0904    OBJECTIVE: Vitals:   09/13/20 1004  BP: 125/76  Pulse: 61  Resp: 17  Temp: (!) 97.3 F (36.3 C)  SpO2: 99%     Body mass index is 26.03 kg/m.    ECOG FS:1 - Symptomatic but completely ambulatory  General: Well-developed, well-nourished, no acute distress.  Sitting in a wheelchair. Eyes: Pink conjunctiva, anicteric sclera. HEENT: Normocephalic, moist  mucous membranes. Lungs: No audible wheezing or coughing. Heart: Regular rate and rhythm. Abdomen: Soft, nontender, no obvious distention. Musculoskeletal: No edema, cyanosis, or clubbing. Neuro: Alert, answering all questions appropriately. Cranial nerves grossly intact. Skin: No rashes or petechiae noted. Psych: Normal affect.   LAB RESULTS:  Lab Results  Component Value Date   NA 137 09/13/2020   K 3.1 (L) 09/13/2020   CL 99 09/13/2020   CO2 25 09/13/2020   GLUCOSE 79 09/13/2020   BUN 13 09/13/2020   CREATININE 0.95 09/13/2020   CALCIUM 9.1 09/13/2020   PROT 7.9 09/13/2020   ALBUMIN 3.0 (L) 09/13/2020   AST 14 (L) 09/13/2020   ALT 8 09/13/2020   ALKPHOS 58 09/13/2020   BILITOT 0.9 09/13/2020   GFRNONAA 59 (L) 09/13/2020   GFRAA >60 07/24/2018    Lab Results  Component Value Date   WBC 1.7 (L) 09/13/2020   NEUTROABS 1.2 (L) 09/13/2020   HGB 9.1 (L) 09/13/2020   HCT 30.1 (L) 09/13/2020   MCV 85.8 09/13/2020   PLT 224 09/13/2020     STUDIES: CT Head W Wo Contrast  Result Date: 08/15/2020 CLINICAL DATA:  Staging non-small cell lung cancer. EXAM: CT HEAD WITHOUT AND WITH CONTRAST TECHNIQUE: Contiguous axial images were obtained from the base of the skull through the vertex without and with intravenous contrast CONTRAST:  61m OMNIPAQUE IOHEXOL 300 MG/ML  SOLN COMPARISON:  Head CT 07/15/2018 and 05/26/2018. FINDINGS: Brain: No focal abnormality affects brainstem. Old infarction of the inferior cerebellum on the left. Cerebral hemispheres show old infarction in the right basal ganglia and radiating white matter tracts and chronic small-vessel change of the hemispheric white matter. No large vessel territory infarction. No mass lesion, hemorrhage, hydrocephalus or extra-axial collection. No other cause of abnormal enhancement is identified. Vascular: There is atherosclerotic calcification of the major vessels at the base of the brain. Skull: Negative Sinuses/Orbits:  Clear/normal Other: None IMPRESSION: No evidence of metastatic disease. Old infarction in the right basal ganglia and radiating white matter tracts. Old infarction of the inferior cerebellum on the left. Chronic small-vessel change of the hemispheric white matter. Electronically Signed   By: MNelson ChimesM.D.   On: 08/15/2020 15:09   PERIPHERAL VASCULAR CATHETERIZATION  Result Date: 08/16/2020 See Op Note  NM PET Image Initial (PI) Skull Base To Thigh  Result Date: 08/15/2020 CLINICAL DATA:  Initial treatment strategy for lung cancer. EXAM: NUCLEAR MEDICINE PET SKULL BASE TO THIGH TECHNIQUE: 8.6 mCi F-18 FDG  was injected intravenously. Full-ring PET imaging was performed from the skull base to thigh after the radiotracer. CT data was obtained and used for attenuation correction and anatomic localization. Fasting blood glucose: 86 mg/dl COMPARISON:  CT abdomen pelvis 07/28/2010, CT chest 07/25/2020. FINDINGS: Mediastinal blood pool activity: SUV max 2.2 Liver activity: SUV max NA NECK: No abnormal hypermetabolism. Incidental CT findings: None. CHEST: Markedly hypermetabolic right hilar nodal mass measures approximately 3.1 cm with an SUV max of 26.0. A central mass in the right hemithorax obstructs the right middle and right lower lobe bronchi, with an SUV max of 31.6. Measurement is difficult without IV contrast and due to associated hypermetabolic postobstructive collapse/consolidation. No hypermetabolic mediastinal or axillary lymph nodes. Incidental CT findings: Atherosclerotic calcification of the aorta, aortic valve and coronary arteries. Heart is enlarged. No pericardial effusion. Separate nodule in the peripheral right lower lobe measures 1.3 cm (4/87) but does not show abnormal hypermetabolism. ABDOMEN/PELVIS: No abnormal hypermetabolism in the liver, adrenal glands, spleen or pancreas. No hypermetabolic lymph nodes. Incidental CT findings: Visualized portion of the liver is unremarkable. Stones are  seen in the gallbladder. Adrenal glands are unremarkable. Mixed attenuation mass in the lower pole right kidney contains fat, measuring 2.6 cm. Additional low-attenuation lesions in both kidneys are better seen on 07/27/2020. Stones in the kidneys bilaterally. Spleen, pancreas, stomach and bowel are grossly unremarkable. Atherosclerotic calcification of the aorta. Bladder is low in volume. SKELETON: No abnormal hypermetabolism. Incidental CT findings: Degenerative changes in the spine. IMPRESSION: 1. Hypermetabolic central right lung mass with obstruction of the right middle and right lower lobe bronchi, associated postobstructive collapse/consolidation and hypermetabolic right hilar adenopathy, findings indicative of at least T2 N1 M0 or stage IB-IIA primary bronchogenic carcinoma. 2. Cholelithiasis. 3. Right renal angiomyolipoma. 4. Bilateral renal stones. 5. Aortic atherosclerosis (ICD10-I70.0). Coronary artery calcification. Electronically Signed   By: Lorin Picket M.D.   On: 08/15/2020 16:25   CUP PACEART REMOTE DEVICE CHECK  Result Date: 08/17/2020 Scheduled remote reviewed. A burden 13.3%; longest AHR 24:26 minutes EGMs suggestive AT vs AF.  1 VHR event 8 beats max v rate 274bpm.  Histogram controlled.  Normal device function.  Forwarding to triage for further review.  R. Powers, CVRS Next remote 91 days.   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  PLAN:    1.  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  CT scan results from July 25, 2020 reviewed independently and reported as above.  Patient subsequently underwent bronchoscopy on July 29, 2020 confirming the diagnosis.  PET scan results from August 15, 2020 reviewed independently and reported as above confirming stage of disease.  CT of the head did not reveal any metastatic  lesions.  Plan to give weekly carboplatinum and Taxol with concurrent XRT followed by maintenance immunotherapy for 1 year.  Patient has had port placement.  Delay cycle 3 of treatment today secondary to neutropenia.  Continue daily XRT.  Return to clinic in 1 week for further evaluation and reconsideration of cycle 3.   2.  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  Given her advanced age and small size of malignancy, Oncotype DX or adjuvant chemotherapy was not necessary.  She was also evaluated by radiation oncology  who determined that adjuvant XRT was also not needed.  Continue letrozole for a total of 5 years completing treatment in August 2025.  Although we will likely discontinue treatment while she is receiving chemotherapy for her lung cancer.  Her most recent mammogram on November 13, 2019 was reported as BI-RADS 2, repeat in July 2022.    2.  Osteoporosis: Patient's most recent bone mineral density on January 14, 2020 reported T score of -2.6 which is mildly improved over 1 year prior where her T score was reported at -2.8.  She is tolerating letrozole well so we will continue this medication as prescribed.  Continue Fosamax, calcium, and vitamin D supplementation.  Repeat bone mineral density in September 2022.   3.  Anemia: Chronic and unchanged.  Patient's hemoglobin is 9.1 today. 4.  Hemoptysis: Resolved. 5.  Hypokalemia: Chronic and unchanged.  Patient potassium is 3.1 today.  Continue oral potassium supplementation. 6.  Weight loss: Patient previously declined dietary referral.  She was given a prescription for Megace today.  7.  Leukopenia/neutropenia: Delay treatment as above.   Patient expressed understanding and was in agreement with this plan. She also understands that She can call clinic at any time with any questions, concerns, or complaints.   Cancer  Staging Malignant neoplasm of upper-outer quadrant of right breast in female, estrogen receptor positive ([place]) Staging form: Breast, AJCC 8th Edition - Clinical stage from 11/06/2018: Stage IA (cT1a, cN0, cM0, G1, ER+, PR+, HER2-) - Signed by [name], MD on 11/06/2018 Histologic grading system: 3 grade system  Squamous cell carcinoma of right lung ([place]) Staging form: Lung, AJCC 8th Edition - Pathologic stage from 08/05/2020: Stage IIIA (pT3, pN1, cM0) - Signed by [name], MD on 08/05/2020 Stage prefix: Initial diagnosis   [name], MD   09/13/2020 10:36 AM

## 2020-09-09 ENCOUNTER — Ambulatory Visit
Admission: RE | Admit: 2020-09-09 | Discharge: 2020-09-09 | Disposition: A | Discharge status: Home / Self Care | Payer: Medicare HMO | Source: Ambulatory Visit | Attending: Radiation Oncology | Admitting: Radiation Oncology

## 2020-09-09 DIAGNOSIS — C3491 Malignant neoplasm of unspecified part of right bronchus or lung: Secondary | ICD-10-CM | POA: Diagnosis not present

## 2020-09-12 ENCOUNTER — Ambulatory Visit
Admission: RE | Admit: 2020-09-12 | Discharge: 2020-09-12 | Disposition: A | Discharge status: Home / Self Care | Payer: Medicare HMO | Source: Ambulatory Visit | Attending: Radiation Oncology | Admitting: Radiation Oncology

## 2020-09-12 DIAGNOSIS — C3491 Malignant neoplasm of unspecified part of right bronchus or lung: Secondary | ICD-10-CM | POA: Diagnosis not present

## 2020-09-13 ENCOUNTER — Encounter: Payer: Self-pay | Admitting: Oncology

## 2020-09-13 ENCOUNTER — Ambulatory Visit
Admission: RE | Admit: 2020-09-13 | Discharge: 2020-09-13 | Disposition: A | Discharge status: Home / Self Care | Payer: Medicare HMO | Source: Ambulatory Visit | Attending: Radiation Oncology | Admitting: Radiation Oncology

## 2020-09-13 ENCOUNTER — Inpatient Hospital Stay: Payer: Medicare HMO

## 2020-09-13 ENCOUNTER — Inpatient Hospital Stay (HOSPITAL_BASED_OUTPATIENT_CLINIC_OR_DEPARTMENT_OTHER): Payer: Medicare HMO | Admitting: Oncology

## 2020-09-13 ENCOUNTER — Other Ambulatory Visit: Payer: Self-pay

## 2020-09-13 VITALS — BP 125/76 | HR 61 | Temp 97.3°F | Resp 17 | Ht 62.0 in | Wt 142.3 lb

## 2020-09-13 DIAGNOSIS — C3491 Malignant neoplasm of unspecified part of right bronchus or lung: Secondary | ICD-10-CM

## 2020-09-13 DIAGNOSIS — Z5111 Encounter for antineoplastic chemotherapy: Secondary | ICD-10-CM | POA: Diagnosis not present

## 2020-09-13 LAB — COMPREHENSIVE METABOLIC PANEL
ALT: 8 U/L (ref 0–44)
AST: 14 U/L — ABNORMAL LOW (ref 15–41)
Albumin: 3 g/dL — ABNORMAL LOW (ref 3.5–5.0)
Alkaline Phosphatase: 58 U/L (ref 38–126)
Anion gap: 13 (ref 5–15)
BUN: 13 mg/dL (ref 8–23)
CO2: 25 mmol/L (ref 22–32)
Calcium: 9.1 mg/dL (ref 8.9–10.3)
Chloride: 99 mmol/L (ref 98–111)
Creatinine, Ser: 0.95 mg/dL (ref 0.44–1.00)
GFR, Estimated: 59 mL/min — ABNORMAL LOW (ref >60)
Glucose, Bld: 79 mg/dL (ref 70–99)
Potassium: 3.1 mmol/L — ABNORMAL LOW (ref 3.5–5.1)
Sodium: 137 mmol/L (ref 135–145)
Total Bilirubin: 0.9 mg/dL (ref 0.3–1.2)
Total Protein: 7.9 g/dL (ref 6.5–8.1)

## 2020-09-13 LAB — CBC WITH DIFFERENTIAL/PLATELET
Abs Immature Granulocytes: 0.02 10*3/uL (ref 0.00–0.07)
Basophils Absolute: 0 10*3/uL (ref 0.0–0.1)
Basophils Relative: 1 %
Eosinophils Absolute: 0 10*3/uL (ref 0.0–0.5)
Eosinophils Relative: 2 %
HCT: 30.1 % — ABNORMAL LOW (ref 36.0–46.0)
Hemoglobin: 9.1 g/dL — ABNORMAL LOW (ref 12.0–15.0)
Immature Granulocytes: 1 %
Lymphocytes Relative: 22 %
Lymphs Abs: 0.4 10*3/uL — ABNORMAL LOW (ref 0.7–4.0)
MCH: 25.9 pg — ABNORMAL LOW (ref 26.0–34.0)
MCHC: 30.2 g/dL (ref 30.0–36.0)
MCV: 85.8 fL (ref 80.0–100.0)
Monocytes Absolute: 0.1 10*3/uL (ref 0.1–1.0)
Monocytes Relative: 8 %
Neutro Abs: 1.2 10*3/uL — ABNORMAL LOW (ref 1.7–7.7)
Neutrophils Relative %: 66 %
Platelets: 224 10*3/uL (ref 150–400)
RBC: 3.51 MIL/uL — ABNORMAL LOW (ref 3.87–5.11)
RDW: 15.9 % — ABNORMAL HIGH (ref 11.5–15.5)
WBC: 1.7 10*3/uL — ABNORMAL LOW (ref 4.0–10.5)
nRBC: 0 % (ref 0.0–0.2)

## 2020-09-13 MED ORDER — SODIUM CHLORIDE 0.9% FLUSH
10.0000 mL | INTRAVENOUS | Status: AC | PRN
  Administered 2020-09-13: 10 mL via INTRAVENOUS
  Filled 2020-09-13: qty 10

## 2020-09-13 MED ORDER — HEPARIN SOD (PORK) LOCK FLUSH 100 UNIT/ML IV SOLN
500.0000 [IU] | Freq: Once | INTRAVENOUS | Status: AC
  Administered 2020-09-13: 500 [IU] via INTRAVENOUS
  Filled 2020-09-13: qty 5

## 2020-09-13 MED ORDER — MEGESTROL ACETATE 40 MG PO TABS: 40.0000 mg | ORAL_TABLET | Freq: Every day | ORAL | 2 refills | Status: DC

## 2020-09-14 ENCOUNTER — Ambulatory Visit
Admission: RE | Admit: 2020-09-14 | Discharge: 2020-09-14 | Disposition: A | Discharge status: Home / Self Care | Payer: Medicare HMO | Source: Ambulatory Visit | Attending: Radiation Oncology | Admitting: Radiation Oncology

## 2020-09-14 DIAGNOSIS — C3491 Malignant neoplasm of unspecified part of right bronchus or lung: Secondary | ICD-10-CM | POA: Diagnosis not present

## 2020-09-15 ENCOUNTER — Ambulatory Visit
Admission: RE | Admit: 2020-09-15 | Discharge: 2020-09-15 | Disposition: A | Discharge status: Home / Self Care | Payer: Medicare HMO | Source: Ambulatory Visit | Attending: Radiation Oncology | Admitting: Radiation Oncology

## 2020-09-15 DIAGNOSIS — C3491 Malignant neoplasm of unspecified part of right bronchus or lung: Secondary | ICD-10-CM | POA: Diagnosis not present

## 2020-09-15 NOTE — Progress Notes (Signed)
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  INTERVAL HISTORY: Patient returns to clinic today for further evaluation and reconsideration of cycle 3 of weekly carboplatin and Taxol.  She continues to tolerate her treatments including daily XRT without significant side effects.  She has chronic weakness and fatigue. She has no neurologic complaints.  She denies any recent fevers.  She continues to have a poor appetite, but denies any weight loss.  She denies any chest pain, shortness of breath, hemoptysis, or cough.  She denies any nausea, vomiting, constipation, or diarrhea.  She has no urinary complaints.  Patient offers no further specific complaints today.  REVIEW OF SYSTEMS:   Review of Systems  Constitutional: Positive for malaise/fatigue. Negative for fever and weight loss.  Respiratory: Negative.  Negative for cough, hemoptysis and shortness of breath.   Cardiovascular: Negative.  Negative for chest pain and leg swelling.  Gastrointestinal: Negative.  Negative for abdominal pain.  Genitourinary: Negative.  Negative for dysuria.  Musculoskeletal: Negative.  Negative for back pain.  Skin: Negative.  Negative for rash.  Neurological: Positive for weakness. Negative for dizziness, focal weakness and headaches.  Psychiatric/Behavioral: Negative.  The patient is not nervous/anxious.     As per HPI. Otherwise, a complete review of systems is negative.  PAST MEDICAL  HISTORY: Past Medical History:  Diagnosis Date  . Breast cancer, right ([place]) 11/2018   Mastectomy  . Complete heart block ([place])   . Dyspnea   . Gait abnormality 07/23/2016  . GERD (gastroesophageal reflux disease)   . Hemoptysis 07/25/2020  . History of hypertension   . History of stroke   . Hyperhomocysteinemia ([place])   . Hyperlipidemia   . Hypertension   . Hypokalemia 07/13/2018  . Low blood magnesium 07/13/2018  . Lung cancer ([place])   . Pacemaker-Medtronic 08/07/2011  . Stroke [facility])    left sided weakness, 07/28/20- no residual   . Vomiting 07/13/2018    PAST SURGICAL HISTORY: Past Surgical History:  Procedure Laterality Date  . BIOPSY  07/15/2018   Procedure: BIOPSY;  Surgeon: [name], MD;  Location: [facility];  Service: Gastroenterology;;  . BREAST BIOPSY Right 10/27/2018   affirm bx of mass, x clip,  INVASIVE MAMMARY CARCINOMA  . BREAST LUMPECTOMY Right 12/03/2018  . BRONCHIAL BIOPSY  07/29/2020   Procedure: BRONCHIAL BIOPSIES;  Surgeon: Laurin Coder, MD;  Location: [facility];  Service: Pulmonary;;  . BRONCHIAL NEEDLE ASPIRATION BIOPSY  07/29/2020   Procedure: BRONCHIAL NEEDLE ASPIRATION BIOPSIES;  Surgeon: Laurin Coder, MD;  Location: [facility];  Service: Pulmonary;;  . CARDIAC CATHETERIZATION    . ENDOBRONCHIAL ULTRASOUND N/A 07/29/2020   Procedure: ENDOBRONCHIAL ULTRASOUND;  Surgeon: Laurin Coder, MD;  Location: [place] [facility];  Service: Pulmonary;  Laterality: N/A;  . ESOPHAGOGASTRODUODENOSCOPY Left 07/15/2018   Procedure: ESOPHAGOGASTRODUODENOSCOPY (EGD);  Surgeon: [name], MD;  Location: [facility];  Service: Gastroenterology;  Laterality: Left;  . HEMOSTASIS CONTROL  07/29/2020   Procedure: HEMOSTASIS CONTROL;  Surgeon: Laurin Coder,  MD;  Location: [place];  Service: Pulmonary;;  . PACEMAKER INSERTION     Medtronic Enpulse dual-chamber pacemaker  . PARTIAL MASTECTOMY WITH NEEDLE LOCALIZATION AND AXILLARY SENTINEL LYMPH NODE  BX Right 12/03/2018   Procedure: PARTIAL MASTECTOMY WITH NEEDLE LOCALIZATION AND AXILLARY SENTINEL LYMPH NODE BX, RIGHT;  Surgeon: [name], MD;  Location: [facility] ORS;  Service: General;  Laterality: Right;  . PERMANENT PACEMAKER GENERATOR CHANGE N/A 07/30/2013   Procedure: PERMANENT PACEMAKER GENERATOR CHANGE;  Surgeon: [name], MD;  Location: [facility] CATH LAB;  Service: Cardiovascular;  Laterality: N/A;  . PORTA CATH INSERTION N/A 08/16/2020   Procedure: PORTA CATH INSERTION;  Surgeon: Katha Cabal, MD;  Location: [place] CV LAB;  Service: Cardiovascular;  Laterality: N/A;  . VIDEO BRONCHOSCOPY N/A 07/29/2020   Procedure: VIDEO BRONCHOSCOPY WITH FLUORO;  Surgeon: Laurin Coder, MD;  Location: [facility] ENDOSCOPY;  Service: Pulmonary;  Laterality: N/A;    FAMILY HISTORY: Family History  Problem Relation Age of Onset  . Hypertension Mother   . Breast cancer Sister   . Breast cancer Sister   . Colon cancer Neg Hx   . Esophageal cancer Neg Hx     ADVANCED DIRECTIVES (Y/N):  N  HEALTH MAINTENANCE: Social History   Tobacco Use  . Smoking status: Former Smoker    Quit date: 07/25/2006    Years since quitting: 14.1  . Smokeless tobacco: Never Used  Vaping Use  . Vaping Use: Never used  Substance Use Topics  . Alcohol use: No  . Drug use: No     Colonoscopy:  PAP:  Bone density:  Lipid panel:  No Known Allergies  Current Outpatient Medications  Medication Sig Dispense Refill  . acetaminophen (TYLENOL) 500 MG tablet Take 500-1,000 mg by mouth every 6 (six) hours as needed for moderate pain.     [name] alendronate (FOSAMAX) 70 MG tablet TAKE 1 TABLET (70 MG TOTAL) BY MOUTH ONCE A WEEK. TAKE WITH A FULL GLASS OF WATER ON AN EMPTY STOMACH. 12 tablet 2  . allopurinol (ZYLOPRIM) 100 MG tablet Take 100 mg by mouth daily.    [name] amLODipine (NORVASC) 5 MG tablet Take 5 mg by mouth daily.    . benzonatate (TESSALON) 100 MG capsule Take 100-200 mg by mouth 3 (three) times  daily as needed for cough.    . Blood Pressure KIT Automated blood pressure measuring device. 1 each 0  . clopidogrel (PLAVIX) 75 MG tablet Take 75 mg by mouth daily.    . hydrochlorothiazide (HYDRODIURIL) 25 MG tablet Take 25 mg by mouth daily.    [name] letrozole (FEMARA) 2.5 MG tablet TAKE 1 TABLET BY MOUTH EVERY DAY (Patient taking differently: Take 2.5 mg by mouth daily.) 90 tablet 3  . lidocaine-prilocaine (EMLA) cream Apply to affected area once 30 g 3  . LUMIGAN 0.01 % SOLN Place 1 drop into both eyes at bedtime.  4  . megestrol (MEGACE) 40 MG tablet Take 1 tablet (40 mg total) by mouth daily. 30 tablet 2  . ondansetron (ZOFRAN) 8 MG tablet Take 1 tablet (8 mg total) by mouth 2 (two) times daily as needed for refractory nausea / vomiting. 60 tablet 2  . pantoprazole (PROTONIX) 40 MG tablet Take 40 mg by mouth daily.    . Potassium 99 MG TABS Take 99 mg by mouth daily.    . prochlorperazine (COMPAZINE) 10 MG tablet Take 1 tablet (10 mg total) by mouth every 6 (six) hours as needed (Nausea or  vomiting). 60 tablet 2  . simvastatin (ZOCOR) 40 MG tablet Take 1 tablet (40 mg total) by mouth daily at 6 PM. 90 tablet 3   No current facility-administered medications for this visit.   Facility-Administered Medications Ordered in Other Visits  Medication Dose Route Frequency Provider Last Rate Last Admin  . 0.9 % NaCl with KCl 20 mEq/ L  infusion   Intravenous Once [name], MD      . sodium chloride flush (NS) 0.9 % injection 10 mL  10 mL Intravenous PRN [name], MD   10 mL at 09/13/20 0904    OBJECTIVE: Vitals:   09/20/20 1001  BP: 122/66  Pulse: 66  Resp: 17  Temp: 97.9 F (36.6 C)     Body mass index is 25.9 kg/m.    ECOG FS:1 - Symptomatic but completely ambulatory  General: Well-developed, well-nourished, no acute distress.  Sitting in a wheelchair. Eyes: Pink conjunctiva, anicteric sclera. HEENT: Normocephalic, moist mucous membranes. Lungs: No audible  wheezing or coughing. Heart: Regular rate and rhythm. Abdomen: Soft, nontender, no obvious distention. Musculoskeletal: No edema, cyanosis, or clubbing. Neuro: Alert, answering all questions appropriately. Cranial nerves grossly intact. Skin: No rashes or petechiae noted. Psych: Normal affect.  LAB RESULTS:  Lab Results  Component Value Date   NA 140 09/20/2020   K 3.1 (L) 09/20/2020   CL 104 09/20/2020   CO2 25 09/20/2020   GLUCOSE 100 (H) 09/20/2020   BUN 16 09/20/2020   CREATININE 0.70 09/20/2020   CALCIUM 8.7 (L) 09/20/2020   PROT 7.6 09/20/2020   ALBUMIN 2.9 (L) 09/20/2020   AST 12 (L) 09/20/2020   ALT 7 09/20/2020   ALKPHOS 59 09/20/2020   BILITOT 0.8 09/20/2020   GFRNONAA >60 09/20/2020   GFRAA >60 07/24/2018    Lab Results  Component Value Date   WBC 3.4 (L) 09/20/2020   NEUTROABS 2.3 09/20/2020   HGB 9.6 (L) 09/20/2020   HCT 32.0 (L) 09/20/2020   MCV 84.2 09/20/2020   PLT 246 09/20/2020     STUDIES: No results found.  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  PLAN:    1.  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  CT scan results from July 25, 2020 reviewed independently and reported as above.  Patient subsequently underwent bronchoscopy on July 29, 2020 confirming the diagnosis.  PET scan results from August 15, 2020 reviewed independently and reported as above confirming stage of disease.  CT of the head did not reveal any metastatic lesions.  Plan to give weekly carboplatinum and Taxol with concurrent XRT followed by maintenance immunotherapy for 1 year.  Patient has had port placement.  Proceed with cycle 3 of treatment today.  Continue daily XRT.  Return to clinic in 1 week for further evaluation and consideration of cycle 4.    2.  Pathologic stage Ia ER/PR positive, HER-2 negative  invasive carcinoma of the upper outer quadrant of the right breast: Patient underwent her lumpectomy on December 03, 2018.  Given her advanced age and small size of malignancy, Oncotype DX or adjuvant chemotherapy was not necessary.  She was also evaluated by radiation oncology who determined that adjuvant XRT was also not needed.  Continue letrozole for a total of 5 years completing treatment in August 2025.  Although we will likely discontinue treatment  while she is receiving chemotherapy for her lung cancer.  Her most recent mammogram on November 13, 2019 was reported as BI-RADS 2, repeat in July 2022.    2.  Osteoporosis: Patient's most recent bone mineral density on January 14, 2020 reported T score of -2.6 which is mildly improved over 1 year prior where her T score was reported at -2.8.  She is tolerating letrozole well so we will continue this medication as prescribed.  Continue Fosamax, calcium, and vitamin D supplementation.  Repeat bone mineral density in September 2022.   3.  Anemia: Hemoglobin mildly improved to 9.6. 4.  Hemoptysis: Resolved. 5.  Hypokalemia: Chronic and unchanged.  Patient's potassium is 3.1 today.  She will receive 20 mEq of IV potassium and was given a prescription for 20 mEq oral daily. 6.  Weight loss: Patient previously declined dietary referral.  Despite Megace, patient's weight has trended down slightly. 7.  Leukopenia/neutropenia: Resolved.  Proceed with treatment as above.  Patient expressed understanding and was in agreement with this plan. She also understands that She can call clinic at any time with any questions, concerns, or complaints.   Cancer Staging Malignant neoplasm of upper-outer quadrant of right breast in female, estrogen receptor positive ([place]) Staging form: Breast, AJCC 8th Edition - Clinical stage from 11/06/2018: Stage IA (cT1a, cN0, cM0, G1, ER+, PR+, HER2-) - Signed by [name], MD on 11/06/2018 Histologic grading system: 3 grade  system  Squamous cell carcinoma of right lung ([place]) Staging form: Lung, AJCC 8th Edition - Pathologic stage from 08/05/2020: Stage IIIA (pT3, pN1, cM0) - Signed by [name], MD on 08/05/2020 Stage prefix: Initial diagnosis   [name], MD   09/20/2020 10:18 AM

## 2020-09-16 ENCOUNTER — Ambulatory Visit
Admission: RE | Admit: 2020-09-16 | Discharge: 2020-09-16 | Disposition: A | Discharge status: Home / Self Care | Payer: Medicare HMO | Source: Ambulatory Visit | Attending: Radiation Oncology | Admitting: Radiation Oncology

## 2020-09-16 DIAGNOSIS — C3491 Malignant neoplasm of unspecified part of right bronchus or lung: Secondary | ICD-10-CM | POA: Diagnosis not present

## 2020-09-19 ENCOUNTER — Ambulatory Visit
Admission: RE | Admit: 2020-09-19 | Discharge: 2020-09-19 | Disposition: A | Discharge status: Home / Self Care | Payer: Medicare HMO | Source: Ambulatory Visit | Attending: Radiation Oncology | Admitting: Radiation Oncology

## 2020-09-19 DIAGNOSIS — C3491 Malignant neoplasm of unspecified part of right bronchus or lung: Secondary | ICD-10-CM | POA: Diagnosis not present

## 2020-09-20 ENCOUNTER — Inpatient Hospital Stay: Payer: Medicare HMO

## 2020-09-20 ENCOUNTER — Ambulatory Visit
Admission: RE | Admit: 2020-09-20 | Discharge: 2020-09-20 | Disposition: A | Discharge status: Home / Self Care | Payer: Medicare HMO | Source: Ambulatory Visit | Attending: Radiation Oncology | Admitting: Radiation Oncology

## 2020-09-20 ENCOUNTER — Inpatient Hospital Stay (HOSPITAL_BASED_OUTPATIENT_CLINIC_OR_DEPARTMENT_OTHER): Payer: Medicare HMO | Admitting: Oncology

## 2020-09-20 ENCOUNTER — Other Ambulatory Visit: Payer: Self-pay

## 2020-09-20 VITALS — BP 122/66 | HR 66 | Temp 97.9°F | Resp 17 | Wt 141.6 lb

## 2020-09-20 DIAGNOSIS — Z5111 Encounter for antineoplastic chemotherapy: Secondary | ICD-10-CM | POA: Diagnosis not present

## 2020-09-20 DIAGNOSIS — C3491 Malignant neoplasm of unspecified part of right bronchus or lung: Secondary | ICD-10-CM | POA: Diagnosis not present

## 2020-09-20 DIAGNOSIS — E876 Hypokalemia: Secondary | ICD-10-CM

## 2020-09-20 LAB — CBC WITH DIFFERENTIAL/PLATELET
Abs Immature Granulocytes: 0.01 10*3/uL (ref 0.00–0.07)
Basophils Absolute: 0 10*3/uL (ref 0.0–0.1)
Basophils Relative: 1 %
Eosinophils Absolute: 0 10*3/uL (ref 0.0–0.5)
Eosinophils Relative: 1 %
HCT: 32 % — ABNORMAL LOW (ref 36.0–46.0)
Hemoglobin: 9.6 g/dL — ABNORMAL LOW (ref 12.0–15.0)
Immature Granulocytes: 0 %
Lymphocytes Relative: 15 %
Lymphs Abs: 0.5 10*3/uL — ABNORMAL LOW (ref 0.7–4.0)
MCH: 25.3 pg — ABNORMAL LOW (ref 26.0–34.0)
MCHC: 30 g/dL (ref 30.0–36.0)
MCV: 84.2 fL (ref 80.0–100.0)
Monocytes Absolute: 0.5 10*3/uL (ref 0.1–1.0)
Monocytes Relative: 16 %
Neutro Abs: 2.3 10*3/uL (ref 1.7–7.7)
Neutrophils Relative %: 67 %
Platelets: 246 10*3/uL (ref 150–400)
RBC: 3.8 MIL/uL — ABNORMAL LOW (ref 3.87–5.11)
RDW: 17.2 % — ABNORMAL HIGH (ref 11.5–15.5)
WBC: 3.4 10*3/uL — ABNORMAL LOW (ref 4.0–10.5)
nRBC: 0 % (ref 0.0–0.2)

## 2020-09-20 LAB — COMPREHENSIVE METABOLIC PANEL
ALT: 7 U/L (ref 0–44)
AST: 12 U/L — ABNORMAL LOW (ref 15–41)
Albumin: 2.9 g/dL — ABNORMAL LOW (ref 3.5–5.0)
Alkaline Phosphatase: 59 U/L (ref 38–126)
Anion gap: 11 (ref 5–15)
BUN: 16 mg/dL (ref 8–23)
CO2: 25 mmol/L (ref 22–32)
Calcium: 8.7 mg/dL — ABNORMAL LOW (ref 8.9–10.3)
Chloride: 104 mmol/L (ref 98–111)
Creatinine, Ser: 0.7 mg/dL (ref 0.44–1.00)
GFR, Estimated: >60 mL/min (ref >60)
Glucose, Bld: 100 mg/dL — ABNORMAL HIGH (ref 70–99)
Potassium: 3.1 mmol/L — ABNORMAL LOW (ref 3.5–5.1)
Sodium: 140 mmol/L (ref 135–145)
Total Bilirubin: 0.8 mg/dL (ref 0.3–1.2)
Total Protein: 7.6 g/dL (ref 6.5–8.1)

## 2020-09-20 MED ORDER — SODIUM CHLORIDE 0.9% FLUSH
10.0000 mL | Freq: Once | INTRAVENOUS | Status: AC
  Administered 2020-09-20: 10 mL via INTRAVENOUS
  Filled 2020-09-20: qty 10

## 2020-09-20 MED ORDER — HEPARIN SOD (PORK) LOCK FLUSH 100 UNIT/ML IV SOLN
500.0000 [IU] | Freq: Once | INTRAVENOUS | Status: AC | PRN
  Administered 2020-09-20: 500 [IU]
  Filled 2020-09-20: qty 5

## 2020-09-20 MED ORDER — SODIUM CHLORIDE 0.9 % IV SOLN
139.0000 mg | Freq: Once | INTRAVENOUS | Status: AC
  Administered 2020-09-20: 140 mg via INTRAVENOUS
  Filled 2020-09-20: qty 14

## 2020-09-20 MED ORDER — POTASSIUM CHLORIDE IN NACL 20-0.9 MEQ/L-% IV SOLN
Freq: Once | INTRAVENOUS | Status: AC
Start: 2020-09-20 — End: 2020-09-20
  Filled 2020-09-20: qty 1000

## 2020-09-20 MED ORDER — FAMOTIDINE 20 MG IN NS 100 ML IVPB
20.0000 mg | Freq: Once | INTRAVENOUS | Status: AC
  Administered 2020-09-20: 20 mg via INTRAVENOUS
  Filled 2020-09-20: qty 20

## 2020-09-20 MED ORDER — PALONOSETRON HCL INJECTION 0.25 MG/5ML
0.2500 mg | Freq: Once | INTRAVENOUS | Status: AC
Start: 2020-09-20 — End: 2020-09-20
  Administered 2020-09-20: 0.25 mg via INTRAVENOUS
  Filled 2020-09-20: qty 5

## 2020-09-20 MED ORDER — SODIUM CHLORIDE 0.9 % IV SOLN
45.0000 mg/m2 | Freq: Once | INTRAVENOUS | Status: AC
  Administered 2020-09-20: 78 mg via INTRAVENOUS
  Filled 2020-09-20: qty 13

## 2020-09-20 MED ORDER — HEPARIN SOD (PORK) LOCK FLUSH 100 UNIT/ML IV SOLN
INTRAVENOUS | Status: AC
  Filled 2020-09-20: qty 5

## 2020-09-20 MED ORDER — SODIUM CHLORIDE 0.9 % IV SOLN
Freq: Once | INTRAVENOUS | Status: AC
  Filled 2020-09-20: qty 250

## 2020-09-20 MED ORDER — DIPHENHYDRAMINE HCL 50 MG/ML IJ SOLN
25.0000 mg | Freq: Once | INTRAMUSCULAR | Status: AC
  Administered 2020-09-20: 25 mg via INTRAVENOUS
  Filled 2020-09-20: qty 1

## 2020-09-20 MED ORDER — SODIUM CHLORIDE 0.9 % IV SOLN
10.0000 mg | Freq: Once | INTRAVENOUS | Status: AC
  Administered 2020-09-20: 10 mg via INTRAVENOUS
  Filled 2020-09-20: qty 10

## 2020-09-20 NOTE — Patient Instructions (Signed)
CANCER CENTER [facility]COL[facility]   Carboplatin injection What is this medicine? CARBOPLATIN (KAR boe pla tin) is a chemotherapy drug. It targets fast dividing cells, like cancer cells, and causes these cells to die. This medicine is used to treat ovarian cancer and many other cancers. This medicine may be used for other purposes; ask your health care provider or pharmacist if you have questions. COMMON BRAND NAME(S): Paraplatin What should I tell my health care provider before I take this medicine? They need to know if you have any of these conditions:  blood disorders  hearing problems  kidney disease  recent or ongoing radiation therapy  an unusual or allergic reaction to carboplatin, cisplatin, other chemotherapy, other medicines, foods, dyes, or preservatives  pregnant or trying to get pregnant  breast-feeding How should I use this medicine? This drug is usually given as an infusion into a vein. It is administered in a hospital or clinic by a specially trained health care professional. Talk to your pediatrician regarding the use of this medicine in children. Special care may be needed. Overdosage: If you think you have taken too much of this medicine contact a poison control center or emergency room at once. NOTE: This medicine is only for you. Do not share this medicine with others. What if I miss a dose? It is important not to miss a dose. Call your doctor or health care professional if you are unable to keep an appointment. What may interact with this medicine?  medicines for seizures  medicines to increase blood counts like filgrastim, pegfilgrastim, sargramostim  some antibiotics like amikacin, gentamicin, neomycin, streptomycin, tobramycin  vaccines Talk to your doctor or health care professional before taking any of these medicines:  acetaminophen  aspirin  ibuprofen  ketoprofen  naproxen This list may not describe all possible  interactions. Give your health care provider a list of all the medicines, herbs, non-prescription drugs, or dietary supplements you use. Also tell them if you smoke, drink alcohol, or use illegal drugs. Some items may interact with your medicine. What should I watch for while using this medicine? Your condition will be monitored carefully while you are receiving this medicine. You will need important blood work done while you are taking this medicine. This drug may make you feel generally unwell. This is not uncommon, as chemotherapy can affect healthy cells as well as cancer cells. Report any side effects. Continue your course of treatment even though you feel ill unless your doctor tells you to stop. In some cases, you may be given additional medicines to help with side effects. Follow all directions for their use. Call your doctor or health care professional for advice if you get a fever, chills or sore throat, or other symptoms of a cold or flu. Do not treat yourself. This drug decreases your body's ability to fight infections. Try to avoid being around people who are sick. This medicine may increase your risk to bruise or bleed. Call your doctor or health care professional if you notice any unusual bleeding. Be careful brushing and flossing your teeth or using a toothpick because you may get an infection or bleed more easily. If you have any dental work done, tell your dentist you are receiving this medicine. Avoid taking products that contain aspirin, acetaminophen, ibuprofen, naproxen, or ketoprofen unless instructed by your doctor. These medicines may hide a fever. Do not become pregnant while taking this medicine. Women should inform their doctor if they wish to become pregnant or  think they might be pregnant. There is a potential for serious side effects to an unborn child. Talk to your health care professional or pharmacist for more information. Do not breast-feed an infant while taking this  medicine. What side effects may I notice from receiving this medicine? Side effects that you should report to your doctor or health care professional as soon as possible:  allergic reactions like skin rash, itching or hives, swelling of the face, lips, or tongue  signs of infection - fever or chills, cough, sore throat, pain or difficulty passing urine  signs of decreased platelets or bleeding - bruising, pinpoint red spots on the skin, black, tarry stools, nosebleeds  signs of decreased red blood cells - unusually weak or tired, fainting spells, lightheadedness  breathing problems  changes in hearing  changes in vision  chest pain  high blood pressure  low blood counts - This drug may decrease the number of white blood cells, red blood cells and platelets. You may be at increased risk for infections and bleeding.  nausea and vomiting  pain, swelling, redness or irritation at the injection site  pain, tingling, numbness in the hands or feet  problems with balance, talking, walking  trouble passing urine or change in the amount of urine Side effects that usually do not require medical attention (report to your doctor or health care professional if they continue or are bothersome):  hair loss  loss of appetite  metallic taste in the mouth or changes in taste This list may not describe all possible side effects. Call your doctor for medical advice about side effects. You may report side effects to FDA at 1-800-FDA-1088. Where should I keep my medicine? This drug is given in a hospital or clinic and will not be stored at home. NOTE: This sheet is a summary. It may not cover all possible information. If you have questions about this medicine, talk to your doctor, pharmacist, or health care provider.  2021 Elsevier/Gold Standard (2007-07-22 14:38:05) Paclitaxel injection What is this medicine? PACLITAXEL (PAK li TAX el) is a chemotherapy drug. It targets fast dividing cells,  like cancer cells, and causes these cells to die. This medicine is used to treat ovarian cancer, breast cancer, lung cancer, Kaposi's sarcoma, and other cancers. This medicine may be used for other purposes; ask your health care provider or pharmacist if you have questions. COMMON BRAND NAME(S): Onxol, Taxol What should I tell my health care provider before I take this medicine? They need to know if you have any of these conditions:  history of irregular heartbeat  liver disease  low blood counts, like low white cell, platelet, or red cell counts  lung or breathing disease, like asthma  tingling of the fingers or toes, or other nerve disorder  an unusual or allergic reaction to paclitaxel, alcohol, polyoxyethylated castor oil, other chemotherapy, other medicines, foods, dyes, or preservatives  pregnant or trying to get pregnant  breast-feeding How should I use this medicine? This drug is given as an infusion into a vein. It is administered in a hospital or clinic by a specially trained health care professional. Talk to your pediatrician regarding the use of this medicine in children. Special care may be needed. Overdosage: If you think you have taken too much of this medicine contact a poison control center or emergency room at once. NOTE: This medicine is only for you. Do not share this medicine with others. What if I miss a dose? It is important not  to miss your dose. Call your doctor or health care professional if you are unable to keep an appointment. What may interact with this medicine? Do not take this medicine with any of the following medications:  live virus vaccines This medicine may also interact with the following medications:  antiviral medicines for hepatitis, HIV or AIDS  certain antibiotics like erythromycin and clarithromycin  certain medicines for fungal infections like ketoconazole and itraconazole  certain medicines for seizures like carbamazepine,  phenobarbital, phenytoin  gemfibrozil  nefazodone  rifampin  St. John's wort This list may not describe all possible interactions. Give your health care provider a list of all the medicines, herbs, non-prescription drugs, or dietary supplements you use. Also tell them if you smoke, drink alcohol, or use illegal drugs. Some items may interact with your medicine. What should I watch for while using this medicine? Your condition will be monitored carefully while you are receiving this medicine. You will need important blood work done while you are taking this medicine. This medicine can cause serious allergic reactions. To reduce your risk you will need to take other medicine(s) before treatment with this medicine. If you experience allergic reactions like skin rash, itching or hives, swelling of the face, lips, or tongue, tell your doctor or health care professional right away. In some cases, you may be given additional medicines to help with side effects. Follow all directions for their use. This drug may make you feel generally unwell. This is not uncommon, as chemotherapy can affect healthy cells as well as cancer cells. Report any side effects. Continue your course of treatment even though you feel ill unless your doctor tells you to stop. Call your doctor or health care professional for advice if you get a fever, chills or sore throat, or other symptoms of a cold or flu. Do not treat yourself. This drug decreases your body's ability to fight infections. Try to avoid being around people who are sick. This medicine may increase your risk to bruise or bleed. Call your doctor or health care professional if you notice any unusual bleeding. Be careful brushing and flossing your teeth or using a toothpick because you may get an infection or bleed more easily. If you have any dental work done, tell your dentist you are receiving this medicine. Avoid taking products that contain aspirin, acetaminophen,  ibuprofen, naproxen, or ketoprofen unless instructed by your doctor. These medicines may hide a fever. Do not become pregnant while taking this medicine. Women should inform their doctor if they wish to become pregnant or think they might be pregnant. There is a potential for serious side effects to an unborn child. Talk to your health care professional or pharmacist for more information. Do not breast-feed an infant while taking this medicine. Men are advised not to father a child while receiving this medicine. This product may contain alcohol. Ask your pharmacist or healthcare provider if this medicine contains alcohol. Be sure to tell all healthcare providers you are taking this medicine. Certain medicines, like metronidazole and disulfiram, can cause an unpleasant reaction when taken with alcohol. The reaction includes flushing, headache, nausea, vomiting, sweating, and increased thirst. The reaction can last from 30 minutes to several hours. What side effects may I notice from receiving this medicine? Side effects that you should report to your doctor or health care professional as soon as possible:  allergic reactions like skin rash, itching or hives, swelling of the face, lips, or tongue  breathing problems  changes in  vision  fast, irregular heartbeat  high or low blood pressure  mouth sores  pain, tingling, numbness in the hands or feet  signs of decreased platelets or bleeding - bruising, pinpoint red spots on the skin, black, tarry stools, blood in the urine  signs of decreased red blood cells - unusually weak or tired, feeling faint or lightheaded, falls  signs of infection - fever or chills, cough, sore throat, pain or difficulty passing urine  signs and symptoms of liver injury like dark yellow or brown urine; general ill feeling or flu-like symptoms; light-colored stools; loss of appetite; nausea; right upper belly pain; unusually weak or tired; yellowing of the eyes or  skin  swelling of the ankles, feet, hands  unusually slow heartbeat Side effects that usually do not require medical attention (report to your doctor or health care professional if they continue or are bothersome):  diarrhea  hair loss  loss of appetite  muscle or joint pain  nausea, vomiting  pain, redness, or irritation at site where injected  tiredness This list may not describe all possible side effects. Call your doctor for medical advice about side effects. You may report side effects to FDA at 1-800-FDA-1088. Where should I keep my medicine? This drug is given in a hospital or clinic and will not be stored at home. NOTE: This sheet is a summary. It may not cover all possible information. If you have questions about this medicine, talk to your doctor, pharmacist, or health care provider.  2021 Elsevier/Gold Standard (2019-03-18 13:37:23)  Discharge Instructions: Thank you for choosing [facility] to provide your oncology and hematology care.  If you have a lab appointment with the [facility], please go directly to the [facility] and check in at the registration area.  Wear comfortable clothing and clothing appropriate for easy access to any Portacath or PICC line.   We strive to give you quality time with your provider. You may need to reschedule your appointment if you arrive late (15 or more minutes).  Arriving late affects you and other patients whose appointments are after yours.  Also, if you miss three or more appointments without notifying the office, you may be dismissed from the clinic at the provider's discretion.      For prescription refill requests, have your pharmacy contact our office and allow 72 hours for refills to be completed.    Today you received the following chemotherapy and/or immunotherapy agents Taxol, Carboplatin      To help prevent nausea and vomiting after your treatment, we encourage you to take your nausea medication as  directed.  BELOW ARE SYMPTOMS THAT SHOULD BE REPORTED IMMEDIATELY: . *FEVER GREATER THAN 100.4 F (38 C) OR HIGHER . *CHILLS OR SWEATING . *NAUSEA AND VOMITING THAT IS NOT CONTROLLED WITH YOUR NAUSEA MEDICATION . *UNUSUAL SHORTNESS OF BREATH . *UNUSUAL BRUISING OR BLEEDING . *URINARY PROBLEMS (pain or burning when urinating, or frequent urination) . *BOWEL PROBLEMS (unusual diarrhea, constipation, pain near the anus) . TENDERNESS IN MOUTH AND THROAT WITH OR WITHOUT PRESENCE OF ULCERS (sore throat, sores in mouth, or a toothache) . UNUSUAL RASH, SWELLING OR PAIN  . UNUSUAL VAGINAL DISCHARGE OR ITCHING   Items with * indicate a potential emergency and should be followed up as soon as possible or go to the Emergency Department if any problems should occur.  Please show the CHEMOTHERAPY ALERT CARD or IMMUNOTHERAPY ALERT CARD at check-in to the Emergency Department and triage nurse.  Should  you have questions after your visit or need to cancel or reschedule your appointment, please contact [facility]  [phone] and follow the prompts.  Office hours are 8:00 a.m. to 4:30 p.m. Monday - Friday. Please note that voicemails left after 4:00 p.m. may not be returned until the following business day.  We are closed weekends and major holidays. You have access to a nurse at all times for urgent questions. Please call the main number to the clinic [phone] and follow the prompts.  For any non-urgent questions, you may also contact your provider using MyChart. We now offer e-Visits for anyone 82 and older to request care online for non-urgent symptoms. For details visit [email or web address].   Also download the MyChart app! Go to the app store, search "MyChart", open the app, select [facility], and log in with your MyChart username and password.  Due to Covid, a mask is required upon entering the hospital/clinic. If you do not have a mask, one will be given to  you upon arrival. For doctor visits, patients may have 1 support person aged 22 or older with them. For treatment visits, patients cannot have anyone with them due to current Covid guidelines and our immunocompromised population.

## 2020-09-21 ENCOUNTER — Ambulatory Visit
Admission: RE | Admit: 2020-09-21 | Discharge: 2020-09-21 | Disposition: A | Discharge status: Home / Self Care | Payer: Medicare HMO | Source: Ambulatory Visit | Attending: Radiation Oncology | Admitting: Radiation Oncology

## 2020-09-21 DIAGNOSIS — C3491 Malignant neoplasm of unspecified part of right bronchus or lung: Secondary | ICD-10-CM | POA: Diagnosis not present

## 2020-09-22 ENCOUNTER — Ambulatory Visit
Admission: RE | Admit: 2020-09-22 | Discharge: 2020-09-22 | Disposition: A | Discharge status: Home / Self Care | Payer: Medicare HMO | Source: Ambulatory Visit | Attending: Radiation Oncology | Admitting: Radiation Oncology

## 2020-09-22 DIAGNOSIS — C3491 Malignant neoplasm of unspecified part of right bronchus or lung: Secondary | ICD-10-CM | POA: Diagnosis not present

## 2020-09-22 NOTE — Progress Notes (Signed)
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  INTERVAL HISTORY: Patient returns to clinic today for further evaluation and consideration of cycle 4 of weekly carboplatin and Taxol.  She continues to tolerate her treatments well without significant side effects.  She is tolerating XRT well.  She continues to have chronic weakness and fatigue.  She continues to have a poor appetite despite Megace and weight loss. She has no neurologic complaints.  She denies any recent fevers. She denies any chest pain, shortness of breath, hemoptysis, or cough.  She denies any nausea, vomiting, constipation, or diarrhea.  She has no urinary complaints.  Patient offers no further specific complaints today.  REVIEW OF SYSTEMS:   Review of Systems  Constitutional: Positive for malaise/fatigue and weight loss. Negative for fever.  Respiratory: Negative.  Negative for cough, hemoptysis and shortness of breath.   Cardiovascular: Negative.  Negative for chest pain and leg swelling.  Gastrointestinal: Negative.  Negative for abdominal pain.  Genitourinary: Negative.  Negative for dysuria.  Musculoskeletal: Negative.  Negative for back pain.  Skin: Negative.  Negative for rash.  Neurological: Positive for weakness. Negative for dizziness, focal weakness and headaches.  Psychiatric/Behavioral: Negative.  The patient is not nervous/anxious.     As per HPI. Otherwise, a complete review of systems is  negative.  PAST MEDICAL HISTORY: Past Medical History:  Diagnosis Date  . Breast cancer, right ([place]) 11/2018   Mastectomy  . Complete heart block ([place])   . Dyspnea   . Gait abnormality 07/23/2016  . GERD (gastroesophageal reflux disease)   . Hemoptysis 07/25/2020  . History of hypertension   . History of stroke   . Hyperhomocysteinemia ([name])   . Hyperlipidemia   . Hypertension   . Hypokalemia 07/13/2018  . Low blood magnesium 07/13/2018  . Lung cancer ([place])   . Pacemaker-Medtronic 08/07/2011  . Stroke [facility])    left sided weakness, 07/28/20- no residual   . Vomiting 07/13/2018    PAST SURGICAL HISTORY: Past Surgical History:  Procedure Laterality Date  . BIOPSY  07/15/2018   Procedure: BIOPSY;  Surgeon: [name], MD;  Location: [facility];  Service: Gastroenterology;;  . BREAST BIOPSY Right 10/27/2018   affirm bx of mass, x clip,  INVASIVE MAMMARY CARCINOMA  . BREAST LUMPECTOMY Right 12/03/2018  . BRONCHIAL BIOPSY  07/29/2020   Procedure: BRONCHIAL BIOPSIES;  Surgeon: Laurin Coder, MD;  Location: [facility] ENDOSCOPY;  Service: Pulmonary;;  . BRONCHIAL NEEDLE ASPIRATION BIOPSY  07/29/2020   Procedure: BRONCHIAL NEEDLE ASPIRATION BIOPSIES;  Surgeon: Laurin Coder, MD;  Location: [facility] ENDOSCOPY;  Service: Pulmonary;;  . CARDIAC CATHETERIZATION    . ENDOBRONCHIAL ULTRASOUND N/A 07/29/2020   Procedure: ENDOBRONCHIAL ULTRASOUND;  Surgeon: Laurin Coder, MD;  Location: [facility] ENDOSCOPY;  Service: Pulmonary;  Laterality: N/A;  . ESOPHAGOGASTRODUODENOSCOPY Left 07/15/2018   Procedure: ESOPHAGOGASTRODUODENOSCOPY (EGD);  Surgeon: [name], MD;  Location: [facility];  Service: Gastroenterology;  Laterality: Left;  . HEMOSTASIS CONTROL  07/29/2020   Procedure: HEMOSTASIS CONTROL;  Surgeon: Laurin Coder, MD;  Location: [facility] ENDOSCOPY;  Service: Pulmonary;;  . PACEMAKER INSERTION     Medtronic Enpulse dual-chamber pacemaker  . PARTIAL MASTECTOMY WITH NEEDLE LOCALIZATION AND  AXILLARY SENTINEL LYMPH NODE BX Right 12/03/2018   Procedure: PARTIAL MASTECTOMY WITH NEEDLE LOCALIZATION AND AXILLARY SENTINEL LYMPH NODE BX, RIGHT;  Surgeon: [name], MD;  Location: [facility] ORS;  Service: General;  Laterality: Right;  . PERMANENT PACEMAKER GENERATOR CHANGE N/A 07/30/2013   Procedure: PERMANENT PACEMAKER GENERATOR CHANGE;  Surgeon: [name], MD;  Location: [facility] CATH LAB;  Service: Cardiovascular;  Laterality: N/A;  . PORTA CATH INSERTION N/A 08/16/2020   Procedure: PORTA CATH INSERTION;  Surgeon: Katha Cabal, MD;  Location: [facility] CV LAB;  Service: Cardiovascular;  Laterality: N/A;  . VIDEO BRONCHOSCOPY N/A 07/29/2020   Procedure: VIDEO BRONCHOSCOPY WITH FLUORO;  Surgeon: Laurin Coder, MD;  Location: [facility] ENDOSCOPY;  Service: Pulmonary;  Laterality: N/A;    FAMILY HISTORY: Family History  Problem Relation Age of Onset  . Hypertension Mother   . Breast cancer Sister   . Breast cancer Sister   . Colon cancer Neg Hx   . Esophageal cancer Neg Hx     ADVANCED DIRECTIVES (Y/N):  N  HEALTH MAINTENANCE: Social History   Tobacco Use  . Smoking status: Former Smoker    Quit date: 07/25/2006    Years since quitting: 14.1  . Smokeless tobacco: Never Used  Vaping Use  . Vaping Use: Never used  Substance Use Topics  . Alcohol use: No  . Drug use: No     Colonoscopy:  PAP:  Bone density:  Lipid panel:  No Known Allergies  Current Outpatient Medications  Medication Sig Dispense Refill  . acetaminophen (TYLENOL) 500 MG tablet Take 500-1,000 mg by mouth every 6 (six) hours as needed for moderate pain.     [name] alendronate (FOSAMAX) 70 MG tablet TAKE 1 TABLET (70 MG TOTAL) BY MOUTH ONCE A WEEK. TAKE WITH A FULL GLASS OF WATER ON AN EMPTY STOMACH. 12 tablet 2  . allopurinol (ZYLOPRIM) 100 MG tablet Take 100 mg by mouth daily.    [name] amLODipine (NORVASC) 5 MG tablet Take 5 mg by mouth daily.    . benzonatate (TESSALON) 100 MG capsule Take 100-200 mg  by mouth 3 (three) times daily as needed for cough.    . Blood Pressure KIT Automated blood pressure measuring device. 1 each 0  . clopidogrel (PLAVIX) 75 MG tablet Take 75 mg by mouth daily.    [name] dexamethasone (DECADRON) 4 MG tablet Take 1 tablet (4 mg total) by mouth daily. 30 tablet 1  . hydrochlorothiazide (HYDRODIURIL) 25 MG tablet Take 25 mg by mouth daily.    [name] letrozole (FEMARA) 2.5 MG tablet TAKE 1 TABLET BY MOUTH EVERY DAY (Patient taking differently: Take 2.5 mg by mouth daily.) 90 tablet 3  . lidocaine-prilocaine (EMLA) cream Apply to affected area once 30 g 3  . LUMIGAN 0.01 % SOLN Place 1 drop into both eyes at bedtime.  4  . ondansetron (ZOFRAN) 8 MG tablet Take 1 tablet (8 mg total) by mouth 2 (two) times daily as needed for refractory nausea / vomiting. 60 tablet 2  . pantoprazole (PROTONIX) 40 MG tablet Take 40 mg by mouth daily.    . potassium chloride SA (KLOR-CON) 20 MEQ tablet Take 1 tablet (20 mEq total) by mouth 2 (two) times daily. 60 tablet 2  . prochlorperazine (COMPAZINE) 10 MG tablet Take 1  tablet (10 mg total) by mouth every 6 (six) hours as needed (Nausea or vomiting). 60 tablet 2  . simvastatin (ZOCOR) 40 MG tablet Take 1 tablet (40 mg total) by mouth daily at 6 PM. 90 tablet 3   No current facility-administered medications for this visit.   Facility-Administered Medications Ordered in Other Visits  Medication Dose Route Frequency Provider Last Rate Last Admin  . sodium chloride flush (NS) 0.9 % injection 10 mL  10 mL Intravenous PRN [name], MD   10 mL at 09/13/20 0904    OBJECTIVE: Vitals:   09/27/20 0929  BP: 114/76  Pulse: 61  Resp: 18  Temp: (!) 97.2 F (36.2 C)  SpO2: 100%     Body mass index is 25.42 kg/m.    ECOG FS:1 - Symptomatic but completely ambulatory  General: Well-developed, well-nourished, no acute distress.  Sitting in a wheelchair. Eyes: Pink conjunctiva, anicteric sclera. HEENT: Normocephalic, moist mucous  membranes. Lungs: No audible wheezing or coughing. Heart: Regular rate and rhythm. Abdomen: Soft, nontender, no obvious distention. Musculoskeletal: No edema, cyanosis, or clubbing. Neuro: Alert, answering all questions appropriately. Cranial nerves grossly intact. Skin: No rashes or petechiae noted. Psych: Normal affect.   LAB RESULTS:  Lab Results  Component Value Date   NA 138 09/27/2020   K 2.9 (L) 09/27/2020   CL 101 09/27/2020   CO2 25 09/27/2020   GLUCOSE 92 09/27/2020   BUN 14 09/27/2020   CREATININE 0.87 09/27/2020   CALCIUM 9.3 09/27/2020   PROT 7.5 09/27/2020   ALBUMIN 3.0 (L) 09/27/2020   AST 14 (L) 09/27/2020   ALT 8 09/27/2020   ALKPHOS 58 09/27/2020   BILITOT 0.6 09/27/2020   GFRNONAA >60 09/27/2020   GFRAA >60 07/24/2018    Lab Results  Component Value Date   WBC 2.9 (L) 09/27/2020   NEUTROABS 2.2 09/27/2020   HGB 9.8 (L) 09/27/2020   HCT 31.7 (L) 09/27/2020   MCV 82.3 09/27/2020   PLT 205 09/27/2020     STUDIES: No results found.  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  PLAN:    1.  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  CT scan results from July 25, 2020 reviewed independently and reported as above.  Patient subsequently underwent bronchoscopy on July 29, 2020 confirming the diagnosis.  PET scan results from August 15, 2020 reviewed independently and reported as above confirming stage of disease.  CT of the head did not reveal any metastatic lesions.  Plan to give weekly carboplatinum and Taxol with concurrent XRT followed by maintenance immunotherapy for 1 year.  Patient has had port placement.  Proceed with cycle 4 of treatment today.  Continue daily XRT completing treatment on October 20, 2020.  Given patient's declining white blood cell count, will skip chemotherapy next week and  patient will return to clinic on October 11, 2020 for consideration of cycle 5.   2.  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  Given her advanced age and small size of malignancy, Oncotype DX or adjuvant chemotherapy was not necessary.  She was also evaluated by radiation oncology who determined that adjuvant XRT was also not needed.  Continue letrozole for a total of 5 years completing  treatment in August 2025.  Although we will likely discontinue treatment while she is receiving chemotherapy for her lung cancer.  Her most recent mammogram on November 13, 2019 was reported as BI-RADS 2, repeat in July 2022.    2.  Osteoporosis: Patient's most recent bone mineral density on January 14, 2020 reported T score of -2.6 which is mildly improved over 1 year prior where her T score was reported at -2.8.  She is tolerating letrozole well so we will continue this medication as prescribed.  Continue Fosamax, calcium, and vitamin D supplementation.  Repeat bone mineral density in September 2022.   3.  Anemia: Chronic and unchanged.  Patient's hemoglobin is 9.8 today. 4.  Hemoptysis: Resolved. 5.  Hypokalemia: Potassium trended down to 2.9.  Patient never received the prescription for oral potassium supplementation, and this was called in for 20 mEq twice per day.   6.  Weight loss: Patient has declined dietary referral.  Megace did not have any appreciable effect, therefore this was discontinued and patient was given a prescription for dexamethasone 4 mg daily.  7.  Leukopenia/neutropenia: Proceed to treatment today, but will delay treatment 1 week as above.  Patient expressed understanding and was in agreement with this plan. She also understands that She can call clinic at any time with any questions, concerns, or complaints.   Cancer Staging Malignant neoplasm of upper-outer quadrant of  right breast in female, estrogen receptor positive ([place]) Staging form: Breast, AJCC 8th Edition - Clinical stage from 11/06/2018: Stage IA (cT1a, cN0, cM0, G1, ER+, PR+, HER2-) - Signed by [name], MD on 11/06/2018 Histologic grading system: 3 grade system  Squamous cell carcinoma of right lung ([place]) Staging form: Lung, AJCC 8th Edition - Pathologic stage from 08/05/2020: Stage IIIA (pT3, pN1, cM0) - Signed by [name], MD on 08/05/2020 Stage prefix: Initial diagnosis   [name], MD   09/27/2020 10:29 AM

## 2020-09-23 ENCOUNTER — Ambulatory Visit
Admission: RE | Admit: 2020-09-23 | Discharge: 2020-09-23 | Disposition: A | Discharge status: Home / Self Care | Payer: Medicare HMO | Source: Ambulatory Visit | Attending: Radiation Oncology | Admitting: Radiation Oncology

## 2020-09-23 DIAGNOSIS — C3491 Malignant neoplasm of unspecified part of right bronchus or lung: Secondary | ICD-10-CM | POA: Diagnosis not present

## 2020-09-27 ENCOUNTER — Inpatient Hospital Stay: Payer: Medicare HMO

## 2020-09-27 ENCOUNTER — Other Ambulatory Visit: Payer: Self-pay

## 2020-09-27 ENCOUNTER — Inpatient Hospital Stay (HOSPITAL_BASED_OUTPATIENT_CLINIC_OR_DEPARTMENT_OTHER): Payer: Medicare HMO | Admitting: Oncology

## 2020-09-27 ENCOUNTER — Ambulatory Visit
Admission: RE | Admit: 2020-09-27 | Discharge: 2020-09-27 | Disposition: A | Discharge status: Home / Self Care | Payer: Medicare HMO | Source: Ambulatory Visit | Attending: Radiation Oncology | Admitting: Radiation Oncology

## 2020-09-27 ENCOUNTER — Encounter: Payer: Self-pay | Admitting: Oncology

## 2020-09-27 VITALS — BP 114/76 | HR 61 | Temp 97.2°F | Resp 18 | Ht 62.0 in | Wt 139.0 lb

## 2020-09-27 DIAGNOSIS — C3491 Malignant neoplasm of unspecified part of right bronchus or lung: Secondary | ICD-10-CM

## 2020-09-27 DIAGNOSIS — Z5111 Encounter for antineoplastic chemotherapy: Secondary | ICD-10-CM | POA: Diagnosis not present

## 2020-09-27 LAB — CBC WITH DIFFERENTIAL/PLATELET
Abs Immature Granulocytes: 0.01 10*3/uL (ref 0.00–0.07)
Basophils Absolute: 0 10*3/uL (ref 0.0–0.1)
Basophils Relative: 1 %
Eosinophils Absolute: 0.1 10*3/uL (ref 0.0–0.5)
Eosinophils Relative: 2 %
HCT: 31.7 % — ABNORMAL LOW (ref 36.0–46.0)
Hemoglobin: 9.8 g/dL — ABNORMAL LOW (ref 12.0–15.0)
Immature Granulocytes: 0 %
Lymphocytes Relative: 16 %
Lymphs Abs: 0.5 10*3/uL — ABNORMAL LOW (ref 0.7–4.0)
MCH: 25.5 pg — ABNORMAL LOW (ref 26.0–34.0)
MCHC: 30.9 g/dL (ref 30.0–36.0)
MCV: 82.3 fL (ref 80.0–100.0)
Monocytes Absolute: 0.2 10*3/uL (ref 0.1–1.0)
Monocytes Relative: 5 %
Neutro Abs: 2.2 10*3/uL (ref 1.7–7.7)
Neutrophils Relative %: 76 %
Platelets: 205 10*3/uL (ref 150–400)
RBC: 3.85 MIL/uL — ABNORMAL LOW (ref 3.87–5.11)
RDW: 16.6 % — ABNORMAL HIGH (ref 11.5–15.5)
WBC: 2.9 10*3/uL — ABNORMAL LOW (ref 4.0–10.5)
nRBC: 0 % (ref 0.0–0.2)

## 2020-09-27 LAB — COMPREHENSIVE METABOLIC PANEL
ALT: 8 U/L (ref 0–44)
AST: 14 U/L — ABNORMAL LOW (ref 15–41)
Albumin: 3 g/dL — ABNORMAL LOW (ref 3.5–5.0)
Alkaline Phosphatase: 58 U/L (ref 38–126)
Anion gap: 12 (ref 5–15)
BUN: 14 mg/dL (ref 8–23)
CO2: 25 mmol/L (ref 22–32)
Calcium: 9.3 mg/dL (ref 8.9–10.3)
Chloride: 101 mmol/L (ref 98–111)
Creatinine, Ser: 0.87 mg/dL (ref 0.44–1.00)
GFR, Estimated: >60 mL/min (ref >60)
Glucose, Bld: 92 mg/dL (ref 70–99)
Potassium: 2.9 mmol/L — ABNORMAL LOW (ref 3.5–5.1)
Sodium: 138 mmol/L (ref 135–145)
Total Bilirubin: 0.6 mg/dL (ref 0.3–1.2)
Total Protein: 7.5 g/dL (ref 6.5–8.1)

## 2020-09-27 MED ORDER — PALONOSETRON HCL INJECTION 0.25 MG/5ML
0.2500 mg | Freq: Once | INTRAVENOUS | Status: AC
  Administered 2020-09-27: 0.25 mg via INTRAVENOUS
  Filled 2020-09-27: qty 5

## 2020-09-27 MED ORDER — DIPHENHYDRAMINE HCL 50 MG/ML IJ SOLN
25.0000 mg | Freq: Once | INTRAMUSCULAR | Status: AC
  Administered 2020-09-27: 25 mg via INTRAVENOUS
  Filled 2020-09-27: qty 1

## 2020-09-27 MED ORDER — SODIUM CHLORIDE 0.9% FLUSH
10.0000 mL | INTRAVENOUS | Status: DC | PRN
  Administered 2020-09-27: 10 mL via INTRAVENOUS
  Filled 2020-09-27: qty 10

## 2020-09-27 MED ORDER — SODIUM CHLORIDE 0.9 % IV SOLN
139.0000 mg | Freq: Once | INTRAVENOUS | Status: AC
  Administered 2020-09-27: 140 mg via INTRAVENOUS
  Filled 2020-09-27: qty 14

## 2020-09-27 MED ORDER — SODIUM CHLORIDE 0.9 % IV SOLN
10.0000 mg | Freq: Once | INTRAVENOUS | Status: AC
  Administered 2020-09-27: 10 mg via INTRAVENOUS
  Filled 2020-09-27: qty 10

## 2020-09-27 MED ORDER — POTASSIUM CHLORIDE CRYS ER 20 MEQ PO TBCR: 20.0000 meq | EXTENDED_RELEASE_TABLET | Freq: Two times a day (BID) | ORAL | 2 refills | Status: DC

## 2020-09-27 MED ORDER — HEPARIN SOD (PORK) LOCK FLUSH 100 UNIT/ML IV SOLN
500.0000 [IU] | Freq: Once | INTRAVENOUS | Status: DC
Start: 2020-09-27 — End: 2020-09-27
  Filled 2020-09-27: qty 5

## 2020-09-27 MED ORDER — SODIUM CHLORIDE 0.9 % IV SOLN
45.0000 mg/m2 | Freq: Once | INTRAVENOUS | Status: AC
  Administered 2020-09-27: 78 mg via INTRAVENOUS
  Filled 2020-09-27: qty 13

## 2020-09-27 MED ORDER — FAMOTIDINE 20 MG IN NS 100 ML IVPB
20.0000 mg | Freq: Once | INTRAVENOUS | Status: AC
  Administered 2020-09-27: 20 mg via INTRAVENOUS
  Filled 2020-09-27: qty 20

## 2020-09-27 MED ORDER — SODIUM CHLORIDE 0.9 % IV SOLN
Freq: Once | INTRAVENOUS | Status: AC
  Filled 2020-09-27: qty 250

## 2020-09-27 MED ORDER — DEXAMETHASONE 4 MG PO TABS: 4.0000 mg | ORAL_TABLET | Freq: Every day | ORAL | 1 refills | Status: DC

## 2020-09-27 NOTE — Patient Instructions (Signed)
[facility]  Discharge Instructions: Thank you for choosing [facility] to provide your oncology and hematology care.  If you have a lab appointment with the [facility], please go directly to the [facility] and check in at the registration area.  Wear comfortable clothing and clothing appropriate for easy access to any Portacath or PICC line.   We strive to give you quality time with your provider. You may need to reschedule your appointment if you arrive late (15 or more minutes).  Arriving late affects you and other patients whose appointments are after yours.  Also, if you miss three or more appointments without notifying the office, you may be dismissed from the clinic at the provider's discretion.      For prescription refill requests, have your pharmacy contact our office and allow 72 hours for refills to be completed.    Today you received the following chemotherapy and/or immunotherapy agents: Taxol, Carboplatin       To help prevent nausea and vomiting after your treatment, we encourage you to take your nausea medication as directed.  BELOW ARE SYMPTOMS THAT SHOULD BE REPORTED IMMEDIATELY: . *FEVER GREATER THAN 100.4 F (38 C) OR HIGHER . *CHILLS OR SWEATING . *NAUSEA AND VOMITING THAT IS NOT CONTROLLED WITH YOUR NAUSEA MEDICATION . *UNUSUAL SHORTNESS OF BREATH . *UNUSUAL BRUISING OR BLEEDING . *URINARY PROBLEMS (pain or burning when urinating, or frequent urination) . *BOWEL PROBLEMS (unusual diarrhea, constipation, pain near the anus) . TENDERNESS IN MOUTH AND THROAT WITH OR WITHOUT PRESENCE OF ULCERS (sore throat, sores in mouth, or a toothache) . UNUSUAL RASH, SWELLING OR PAIN  . UNUSUAL VAGINAL DISCHARGE OR ITCHING   Items with * indicate a potential emergency and should be followed up as soon as possible or go to the Emergency Department if any problems should occur.  Please show the CHEMOTHERAPY ALERT CARD or  IMMUNOTHERAPY ALERT CARD at check-in to the Emergency Department and triage nurse.  Should you have questions after your visit or need to cancel or reschedule your appointment, please contact [facility]  [phone] and follow the prompts.  Office hours are 8:00 a.m. to 4:30 p.m. Monday - Friday. Please note that voicemails left after 4:00 p.m. may not be returned until the following business day.  We are closed weekends and major holidays. You have access to a nurse at all times for urgent questions. Please call the main number to the clinic [phone] and follow the prompts.  For any non-urgent questions, you may also contact your provider using MyChart. We now offer e-Visits for anyone 90 and older to request care online for non-urgent symptoms. For details visit [email or web address].   Also download the MyChart app! Go to the app store, search "MyChart", open the app, select [facility], and log in with your MyChart username and password.  Due to Covid, a mask is required upon entering the hospital/clinic. If you do not have a mask, one will be given to you upon arrival. For doctor visits, patients may have 1 support person aged 58 or older with them. For treatment visits, patients cannot have anyone with them due to current Covid guidelines and our immunocompromised population.

## 2020-09-28 ENCOUNTER — Ambulatory Visit
Admission: RE | Admit: 2020-09-28 | Discharge: 2020-09-28 | Disposition: A | Discharge status: Home / Self Care | Payer: Medicare HMO | Source: Ambulatory Visit | Attending: Radiation Oncology | Admitting: Radiation Oncology

## 2020-09-28 DIAGNOSIS — Z510 Encounter for antineoplastic radiation therapy: Secondary | ICD-10-CM | POA: Insufficient documentation

## 2020-09-28 DIAGNOSIS — C3481 Malignant neoplasm of overlapping sites of right bronchus and lung: Secondary | ICD-10-CM | POA: Insufficient documentation

## 2020-09-29 ENCOUNTER — Ambulatory Visit
Admission: RE | Admit: 2020-09-29 | Discharge: 2020-09-29 | Disposition: A | Discharge status: Home / Self Care | Payer: Medicare HMO | Source: Ambulatory Visit | Attending: Radiation Oncology | Admitting: Radiation Oncology

## 2020-09-29 DIAGNOSIS — Z510 Encounter for antineoplastic radiation therapy: Secondary | ICD-10-CM | POA: Diagnosis not present

## 2020-09-30 ENCOUNTER — Ambulatory Visit
Admission: RE | Admit: 2020-09-30 | Discharge: 2020-09-30 | Disposition: A | Discharge status: Home / Self Care | Payer: Medicare HMO | Source: Ambulatory Visit | Attending: Radiation Oncology | Admitting: Radiation Oncology

## 2020-09-30 DIAGNOSIS — Z510 Encounter for antineoplastic radiation therapy: Secondary | ICD-10-CM | POA: Diagnosis not present

## 2020-10-03 ENCOUNTER — Ambulatory Visit
Admission: RE | Admit: 2020-10-03 | Discharge: 2020-10-03 | Disposition: A | Discharge status: Home / Self Care | Payer: Medicare HMO | Source: Ambulatory Visit | Attending: Radiation Oncology | Admitting: Radiation Oncology

## 2020-10-03 DIAGNOSIS — Z510 Encounter for antineoplastic radiation therapy: Secondary | ICD-10-CM | POA: Diagnosis not present

## 2020-10-04 ENCOUNTER — Inpatient Hospital Stay: Payer: Medicare HMO | Admitting: Oncology

## 2020-10-04 ENCOUNTER — Inpatient Hospital Stay: Payer: Medicare HMO

## 2020-10-04 ENCOUNTER — Ambulatory Visit
Admission: RE | Admit: 2020-10-04 | Discharge: 2020-10-04 | Disposition: A | Discharge status: Home / Self Care | Payer: Medicare HMO | Source: Ambulatory Visit | Attending: Radiation Oncology | Admitting: Radiation Oncology

## 2020-10-04 DIAGNOSIS — Z510 Encounter for antineoplastic radiation therapy: Secondary | ICD-10-CM | POA: Diagnosis not present

## 2020-10-05 ENCOUNTER — Ambulatory Visit
Admission: RE | Admit: 2020-10-05 | Discharge: 2020-10-05 | Disposition: A | Discharge status: Home / Self Care | Payer: Medicare HMO | Source: Ambulatory Visit | Attending: Radiation Oncology | Admitting: Radiation Oncology

## 2020-10-05 DIAGNOSIS — Z510 Encounter for antineoplastic radiation therapy: Secondary | ICD-10-CM | POA: Diagnosis not present

## 2020-10-06 ENCOUNTER — Inpatient Hospital Stay: Payer: Medicare HMO | Attending: Oncology

## 2020-10-06 ENCOUNTER — Other Ambulatory Visit: Payer: Self-pay | Admitting: General Surgery

## 2020-10-06 ENCOUNTER — Ambulatory Visit
Admission: RE | Admit: 2020-10-06 | Discharge: 2020-10-06 | Disposition: A | Discharge status: Home / Self Care | Payer: Medicare HMO | Source: Ambulatory Visit | Attending: Radiation Oncology | Admitting: Radiation Oncology

## 2020-10-06 DIAGNOSIS — Z5111 Encounter for antineoplastic chemotherapy: Secondary | ICD-10-CM | POA: Insufficient documentation

## 2020-10-06 DIAGNOSIS — I10 Essential (primary) hypertension: Secondary | ICD-10-CM | POA: Insufficient documentation

## 2020-10-06 DIAGNOSIS — Z8673 Personal history of transient ischemic attack (TIA), and cerebral infarction without residual deficits: Secondary | ICD-10-CM | POA: Insufficient documentation

## 2020-10-06 DIAGNOSIS — C3491 Malignant neoplasm of unspecified part of right bronchus or lung: Secondary | ICD-10-CM | POA: Insufficient documentation

## 2020-10-06 DIAGNOSIS — Z510 Encounter for antineoplastic radiation therapy: Secondary | ICD-10-CM | POA: Diagnosis not present

## 2020-10-06 DIAGNOSIS — D72819 Decreased white blood cell count, unspecified: Secondary | ICD-10-CM | POA: Insufficient documentation

## 2020-10-06 DIAGNOSIS — Z170 Estrogen receptor positive status [ER+]: Secondary | ICD-10-CM | POA: Insufficient documentation

## 2020-10-06 DIAGNOSIS — Z79899 Other long term (current) drug therapy: Secondary | ICD-10-CM | POA: Insufficient documentation

## 2020-10-06 DIAGNOSIS — Z7952 Long term (current) use of systemic steroids: Secondary | ICD-10-CM | POA: Insufficient documentation

## 2020-10-06 DIAGNOSIS — Z853 Personal history of malignant neoplasm of breast: Secondary | ICD-10-CM

## 2020-10-06 DIAGNOSIS — Z79811 Long term (current) use of aromatase inhibitors: Secondary | ICD-10-CM | POA: Insufficient documentation

## 2020-10-06 DIAGNOSIS — C50411 Malignant neoplasm of upper-outer quadrant of right female breast: Secondary | ICD-10-CM | POA: Insufficient documentation

## 2020-10-06 DIAGNOSIS — E785 Hyperlipidemia, unspecified: Secondary | ICD-10-CM | POA: Insufficient documentation

## 2020-10-06 NOTE — Progress Notes (Signed)
Nutrition Assessment   Reason for Assessment: weight loss, poor appetite Referral from [place], RN   ASSESSMENT:  86 year old female with right lung cancer. Past medical history of right breast cancer, s/p mastectomy 11/2018, GERD, HTN, stroke, HLD.  Patient receiving chemotherapy and radiation.  Met with patient and sister, Vivien Rota.  Patient reports poor appetite.  Continues to take megace and dexamethasone.  Drank and ensure this am (butter pecan) but does not really like the shakes. Does not like boost.  Reports yesterday she drank 2 shakes.  Usually after radiation will get a biscuit (sausage or egg and cheese or ham) and coffee and eats about half of it.  Eats again around 12:30, maybe biscuit or fruit.  Sometimes eats during the evening.  Lives alone. Family and friend help her with meals.  Sister reports that she has food near her all the time.  Has issues with constipation, takes miralax. Reports taste alterations.    Medications: protonix, megace, dexamethasone, KCL, compazine   Labs: reviewed   Anthropometrics:   Height: 62 inches Weight: 134 lb 7 oz on 6/7 07/25/20 165 lb BMI: 25  19% weight loss in the last 2 1/2 months, significant   Estimated Energy Needs  Kcals: 1525-1800 Protein: 76-90 g Fluid: 1.5 L   NUTRITION DIAGNOSIS: Inadequate oral intake related to cancer and cancer related treatment side effects as evidenced by 19% weight loss in the last 2 1/2 months and poor appetite    INTERVENTION:  Discussed strategies to help with taste change. Handout provided Discussed ways to add calories and protein. Handout provided Gave samples of ensure complete, boost breeze, boost soothe, ensure clear, orgain shake and coupons to try.  Contact information provided   MONITORING, EVALUATION, GOAL: weight trends, intake   Next Visit: Thursday, June 16th after radiation  Arlis Yale [name] Resides, [place], [place] Registered Dietitian [phone] (mobile)

## 2020-10-07 ENCOUNTER — Encounter: Payer: Self-pay | Admitting: *Deleted

## 2020-10-07 ENCOUNTER — Ambulatory Visit
Admission: RE | Admit: 2020-10-07 | Discharge: 2020-10-07 | Disposition: A | Discharge status: Home / Self Care | Payer: Medicare HMO | Source: Ambulatory Visit | Attending: Radiation Oncology | Admitting: Radiation Oncology

## 2020-10-07 DIAGNOSIS — Z510 Encounter for antineoplastic radiation therapy: Secondary | ICD-10-CM | POA: Diagnosis not present

## 2020-10-07 NOTE — Progress Notes (Signed)
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  INTERVAL HISTORY: Patient returns to clinic today for further evaluation and consideration of cycle 5 of weekly carboplatinum and Taxol.  She did not receive treatment last week secondary to neutropenia.  She complains of increased urinary continence, but her daughter feels this is secondary to her diuretic.  She has chronic weakness and fatigue.  She is tolerating her treatments well without significant side effects. She has no neurologic complaints.  She denies any recent fevers. She denies any chest pain, shortness of breath, hemoptysis, or cough.  She denies any nausea, vomiting, constipation, or diarrhea.  She has no other urinary complaints.  Patient offers no further specific complaints today.  REVIEW OF SYSTEMS:   Review of Systems  Constitutional:  Positive for malaise/fatigue and weight loss. Negative for fever.  Respiratory: Negative.  Negative for cough, hemoptysis and shortness of breath.   Cardiovascular: Negative.  Negative for chest pain and leg swelling.  Gastrointestinal: Negative.  Negative for abdominal pain.  Genitourinary:  Positive for frequency. Negative for dysuria.  Musculoskeletal: Negative.  Negative for back pain.  Skin: Negative.  Negative for rash.  Neurological:  Positive for weakness. Negative for dizziness, focal weakness and headaches.  Psychiatric/Behavioral: Negative.  The patient is not  nervous/anxious.    As per HPI. Otherwise, a complete review of systems is negative.  PAST MEDICAL HISTORY: Past Medical History:  Diagnosis Date   Breast cancer, right ([place]) 11/2018   Mastectomy   Complete heart block (HCC)    Dyspnea    Gait abnormality 07/23/2016   GERD (gastroesophageal reflux disease)    Hemoptysis 07/25/2020   History of hypertension    History of stroke    Hyperhomocysteinemia (HCC)    Hyperlipidemia    Hypertension    Hypokalemia 07/13/2018   Low blood magnesium 07/13/2018   Lung cancer ([place])    Pacemaker-Medtronic 08/07/2011   Stroke ([place])    left sided weakness, 07/28/20- no residual    Vomiting 07/13/2018    PAST SURGICAL HISTORY: Past Surgical History:  Procedure Laterality Date   BIOPSY  07/15/2018   Procedure: BIOPSY;  Surgeon: [name], MD;  Location: [facility];  Service: Gastroenterology;;   BREAST BIOPSY Right 10/27/2018   affirm bx of mass, x clip,  INVASIVE MAMMARY CARCINOMA   BREAST LUMPECTOMY Right 12/03/2018   BRONCHIAL BIOPSY  07/29/2020   Procedure: BRONCHIAL BIOPSIES;  Surgeon: Laurin Coder, MD;  Location: [facility];  Service: Pulmonary;;   BRONCHIAL NEEDLE ASPIRATION BIOPSY  07/29/2020   Procedure: BRONCHIAL NEEDLE ASPIRATION BIOPSIES;  Surgeon: Laurin Coder, MD;  Location: [facility];  Service: Pulmonary;;   CARDIAC CATHETERIZATION     ENDOBRONCHIAL ULTRASOUND N/A 07/29/2020   Procedure: ENDOBRONCHIAL ULTRASOUND;  Surgeon: Laurin Coder, MD;  Location: [facility];  Service: Pulmonary;  Laterality: N/A;   ESOPHAGOGASTRODUODENOSCOPY Left 07/15/2018   Procedure: ESOPHAGOGASTRODUODENOSCOPY (EGD);  Surgeon: [name], MD;  Location: [facility];  Service: Gastroenterology;  Laterality: Left;  HEMOSTASIS CONTROL  07/29/2020   Procedure: HEMOSTASIS CONTROL;  Surgeon: Laurin Coder, MD;  Location: [facility];  Service: Pulmonary;;   PACEMAKER INSERTION     Medtronic Enpulse dual-chamber pacemaker    PARTIAL MASTECTOMY WITH NEEDLE LOCALIZATION AND AXILLARY SENTINEL LYMPH NODE BX Right 12/03/2018   Procedure: PARTIAL MASTECTOMY WITH NEEDLE LOCALIZATION AND AXILLARY SENTINEL LYMPH NODE BX, RIGHT;  Surgeon: [name], MD;  Location: [facility];  Service: General;  Laterality: Right;   PERMANENT PACEMAKER GENERATOR CHANGE N/A 07/30/2013   Procedure: PERMANENT PACEMAKER GENERATOR CHANGE;  Surgeon: [name], MD;  Location: [facility];  Service: Cardiovascular;  Laterality: N/A;   PORTA CATH INSERTION N/A 08/16/2020   Procedure: PORTA CATH INSERTION;  Surgeon: Katha Cabal, MD;  Location: [facility] LAB;  Service: Cardiovascular;  Laterality: N/A;   VIDEO BRONCHOSCOPY N/A 07/29/2020   Procedure: VIDEO BRONCHOSCOPY WITH FLUORO;  Surgeon: Laurin Coder, MD;  Location: [facility];  Service: Pulmonary;  Laterality: N/A;    FAMILY HISTORY: Family History  Problem Relation Age of Onset   Hypertension Mother    Breast cancer Sister    Breast cancer Sister    Colon cancer Neg Hx    Esophageal cancer Neg Hx     ADVANCED DIRECTIVES (Y/N):  N  HEALTH MAINTENANCE: Social History   Tobacco Use   Smoking status: Former    Pack years: 0.00    Types: Cigarettes    Quit date: 07/25/2006    Years since quitting: 14.2   Smokeless tobacco: Never  Vaping Use   Vaping Use: Never used  Substance Use Topics   Alcohol use: No   Drug use: No     Colonoscopy:  PAP:  Bone density:  Lipid panel:  No Known Allergies  Current Outpatient Medications  Medication Sig Dispense Refill   acetaminophen (TYLENOL) 500 MG tablet Take 500-1,000 mg by mouth every 6 (six) hours as needed for moderate pain.      alendronate (FOSAMAX) 70 MG tablet TAKE 1 TABLET (70 MG TOTAL) BY MOUTH ONCE A WEEK. TAKE WITH A FULL GLASS OF WATER ON AN EMPTY STOMACH. 12 tablet 2   allopurinol (ZYLOPRIM) 100 MG tablet Take 100 mg by mouth daily.     amLODipine (NORVASC) 5 MG tablet Take 5 mg by mouth  daily.     Blood Pressure KIT Automated blood pressure measuring device. 1 each 0   clopidogrel (PLAVIX) 75 MG tablet Take 75 mg by mouth daily.     dexamethasone (DECADRON) 4 MG tablet Take 1 tablet (4 mg total) by mouth daily. 30 tablet 1   hydrochlorothiazide (HYDRODIURIL) 25 MG tablet Take 25 mg by mouth daily.     letrozole (FEMARA) 2.5 MG tablet TAKE 1 TABLET BY MOUTH EVERY DAY (Patient taking differently: Take 2.5 mg by mouth daily.) 90 tablet 3   lidocaine-prilocaine (EMLA) cream Apply to affected area once 30 g 3   LUMIGAN 0.01 % SOLN Place 1 drop into both eyes at bedtime.  4   ondansetron (ZOFRAN) 8 MG tablet Take 1 tablet (8 mg total) by mouth 2 (two) times daily as needed for refractory nausea / vomiting. 60 tablet 2   pantoprazole (PROTONIX) 40 MG tablet Take 40 mg by mouth daily.     potassium chloride SA (KLOR-CON) 20 MEQ tablet Take 1 tablet (20 mEq total) by mouth 2 (two) times daily. 60 tablet 2   prochlorperazine (COMPAZINE) 10 MG tablet Take 1 tablet (10 mg  total) by mouth every 6 (six) hours as needed (Nausea or vomiting). 60 tablet 2   simvastatin (ZOCOR) 40 MG tablet Take 1 tablet (40 mg total) by mouth daily at 6 PM. 90 tablet 3   No current facility-administered medications for this visit.   Facility-Administered Medications Ordered in Other Visits  Medication Dose Route Frequency Provider Last Rate Last Admin   CARBOplatin (PARAPLATIN) 140 mg in sodium chloride 0.9 % 100 mL chemo infusion  140 mg Intravenous Once [name], MD 228 mL/hr at 10/11/20 1317 140 mg at 10/11/20 1317   heparin lock flush 100 unit/mL  500 Units Intravenous Once [name], MD       sodium chloride flush (NS) 0.9 % injection 10 mL  10 mL Intravenous PRN [name], MD   10 mL at 09/13/20 0904    OBJECTIVE: Vitals:   10/11/20 0953  BP: 105/72  Pulse: 60  Resp: 18  Temp: 97.9 F (36.6 C)     Body mass index is 23.96 kg/m.    ECOG FS:1 - Symptomatic but  completely ambulatory  General: Well-developed, well-nourished, no acute distress. Eyes: Pink conjunctiva, anicteric sclera. HEENT: Normocephalic, moist mucous membranes. Lungs: No audible wheezing or coughing. Heart: Regular rate and rhythm. Abdomen: Soft, nontender, no obvious distention. Musculoskeletal: No edema, cyanosis, or clubbing. Neuro: Alert, answering all questions appropriately. Cranial nerves grossly intact. Skin: No rashes or petechiae noted. Psych: Normal affect.   LAB RESULTS:  Lab Results  Component Value Date   NA 134 (L) 10/11/2020   K 4.8 10/11/2020   CL 99 10/11/2020   CO2 25 10/11/2020   GLUCOSE 100 (H) 10/11/2020   BUN 33 (H) 10/11/2020   CREATININE 0.91 10/11/2020   CALCIUM 10.1 10/11/2020   PROT 8.0 10/11/2020   ALBUMIN 3.5 10/11/2020   AST 18 10/11/2020   ALT 18 10/11/2020   ALKPHOS 47 10/11/2020   BILITOT 0.7 10/11/2020   GFRNONAA >60 10/11/2020   GFRAA >60 07/24/2018    Lab Results  Component Value Date   WBC 3.7 (L) 10/11/2020   NEUTROABS 2.8 10/11/2020   HGB 12.1 10/11/2020   HCT 38.5 10/11/2020   MCV 83.7 10/11/2020   PLT 157 10/11/2020     STUDIES: No results found.  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  PLAN:    1.  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  CT scan results from July 25, 2020 reviewed independently and reported as above.  Patient subsequently underwent bronchoscopy on July 29, 2020 confirming the diagnosis.  PET scan results from August 15, 2020 reviewed independently and reported as above confirming stage of disease.  CT of the head did not reveal any metastatic lesions.  Plan to give weekly carboplatinum and Taxol with concurrent XRT followed by maintenance immunotherapy for 1 year.  Patient has had port placement.  Proceed with cycle 5 of  treatment today. Continue daily XRT completing treatment on October 20, 2020.  Return to clinic in 1 week for her sixth and final treatment of carboplatin and Taxol.  At which point, we will transition her to maintenance durvalumab.  2.  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  Given her advanced age and small  size of malignancy, Oncotype DX or adjuvant chemotherapy was not necessary.  She was also evaluated by radiation oncology who determined that adjuvant XRT was also not needed.  Continue letrozole for a total of 5 years completing treatment in August 2025.  Although we will likely discontinue treatment while she is receiving chemotherapy for her lung cancer.  Her most recent mammogram on November 13, 2019 was reported as BI-RADS 2, repeat in July 2022.    2.  Osteoporosis: Patient's most recent bone mineral density on January 14, 2020 reported T score of -2.6 which is mildly improved over 1 year prior where her T score was reported at -2.8.  She is tolerating letrozole well so we will continue this medication as prescribed.  Continue Fosamax, calcium, and vitamin D supplementation.  Repeat bone mineral density in September 2022.   3.  Anemia: Resolved. 4.  Hemoptysis: Resolved. 5.  Hypokalemia: Resolved.  Continue new oral potassium supplementation. 6.  Weight loss: Patient has declined dietary referral.  Continue dexamethasone 7.  Leukopenia/neutropenia: Improved.  Proceed with treatment as above.   Patient expressed understanding and was in agreement with this plan. She also understands that She can call clinic at any time with any questions, concerns, or complaints.   Cancer Staging Malignant neoplasm of upper-outer quadrant of right breast in female, estrogen receptor positive ([place]) Staging form: Breast, AJCC 8th Edition - Clinical stage from 11/06/2018: Stage IA (cT1a,  cN0, cM0, G1, ER+, PR+, HER2-) - Signed by [name], MD on 11/06/2018 Histologic grading system: 3 grade system  Squamous cell carcinoma of right lung ([place]) Staging form: Lung, AJCC 8th Edition - Pathologic stage from 08/05/2020: Stage IIIA (pT3, pN1, cM0) - Signed by [name], MD on 08/05/2020 Stage prefix: Initial diagnosis   [name], MD   10/11/2020 1:23 PM

## 2020-10-10 ENCOUNTER — Ambulatory Visit
Admission: RE | Admit: 2020-10-10 | Discharge: 2020-10-10 | Disposition: A | Discharge status: Home / Self Care | Payer: Medicare HMO | Source: Ambulatory Visit | Attending: Radiation Oncology | Admitting: Radiation Oncology

## 2020-10-10 DIAGNOSIS — Z510 Encounter for antineoplastic radiation therapy: Secondary | ICD-10-CM | POA: Diagnosis not present

## 2020-10-11 ENCOUNTER — Encounter: Payer: Self-pay | Admitting: Oncology

## 2020-10-11 ENCOUNTER — Ambulatory Visit
Admission: RE | Admit: 2020-10-11 | Discharge: 2020-10-11 | Disposition: A | Discharge status: Home / Self Care | Payer: Medicare HMO | Source: Ambulatory Visit | Attending: Radiation Oncology | Admitting: Radiation Oncology

## 2020-10-11 ENCOUNTER — Inpatient Hospital Stay: Payer: Medicare HMO

## 2020-10-11 ENCOUNTER — Inpatient Hospital Stay (HOSPITAL_BASED_OUTPATIENT_CLINIC_OR_DEPARTMENT_OTHER): Payer: Medicare HMO | Admitting: Oncology

## 2020-10-11 VITALS — BP 105/72 | HR 60 | Temp 97.9°F | Resp 18 | Wt 131.0 lb

## 2020-10-11 DIAGNOSIS — Z5111 Encounter for antineoplastic chemotherapy: Secondary | ICD-10-CM | POA: Diagnosis present

## 2020-10-11 DIAGNOSIS — Z95828 Presence of other vascular implants and grafts: Secondary | ICD-10-CM

## 2020-10-11 DIAGNOSIS — C3491 Malignant neoplasm of unspecified part of right bronchus or lung: Secondary | ICD-10-CM

## 2020-10-11 DIAGNOSIS — R32 Unspecified urinary incontinence: Secondary | ICD-10-CM

## 2020-10-11 DIAGNOSIS — I10 Essential (primary) hypertension: Secondary | ICD-10-CM | POA: Diagnosis not present

## 2020-10-11 DIAGNOSIS — Z7952 Long term (current) use of systemic steroids: Secondary | ICD-10-CM | POA: Diagnosis not present

## 2020-10-11 DIAGNOSIS — Z8673 Personal history of transient ischemic attack (TIA), and cerebral infarction without residual deficits: Secondary | ICD-10-CM | POA: Diagnosis not present

## 2020-10-11 DIAGNOSIS — Z79811 Long term (current) use of aromatase inhibitors: Secondary | ICD-10-CM | POA: Diagnosis not present

## 2020-10-11 DIAGNOSIS — E785 Hyperlipidemia, unspecified: Secondary | ICD-10-CM | POA: Diagnosis not present

## 2020-10-11 DIAGNOSIS — D72819 Decreased white blood cell count, unspecified: Secondary | ICD-10-CM | POA: Diagnosis not present

## 2020-10-11 DIAGNOSIS — Z170 Estrogen receptor positive status [ER+]: Secondary | ICD-10-CM | POA: Diagnosis not present

## 2020-10-11 DIAGNOSIS — Z79899 Other long term (current) drug therapy: Secondary | ICD-10-CM | POA: Diagnosis not present

## 2020-10-11 DIAGNOSIS — Z510 Encounter for antineoplastic radiation therapy: Secondary | ICD-10-CM | POA: Diagnosis not present

## 2020-10-11 DIAGNOSIS — C50411 Malignant neoplasm of upper-outer quadrant of right female breast: Secondary | ICD-10-CM | POA: Diagnosis present

## 2020-10-11 LAB — URINALYSIS, COMPLETE (UACMP) WITH MICROSCOPIC
Bilirubin Urine: NEGATIVE
Glucose, UA: NEGATIVE mg/dL
Ketones, ur: NEGATIVE mg/dL
Nitrite: NEGATIVE
Protein, ur: 100 mg/dL — AB
Specific Gravity, Urine: 1.015 (ref 1.005–1.030)
Squamous Epithelial / HPF: NONE SEEN (ref 0–5)
WBC, UA: >50 WBC/hpf — ABNORMAL HIGH (ref 0–5)
pH: 7 (ref 5.0–8.0)

## 2020-10-11 LAB — CBC WITH DIFFERENTIAL/PLATELET
Abs Immature Granulocytes: 0.02 10*3/uL (ref 0.00–0.07)
Basophils Absolute: 0 10*3/uL (ref 0.0–0.1)
Basophils Relative: 0 %
Eosinophils Absolute: 0 10*3/uL (ref 0.0–0.5)
Eosinophils Relative: 1 %
HCT: 38.5 % (ref 36.0–46.0)
Hemoglobin: 12.1 g/dL (ref 12.0–15.0)
Immature Granulocytes: 1 %
Lymphocytes Relative: 7 %
Lymphs Abs: 0.3 10*3/uL — ABNORMAL LOW (ref 0.7–4.0)
MCH: 26.3 pg (ref 26.0–34.0)
MCHC: 31.4 g/dL (ref 30.0–36.0)
MCV: 83.7 fL (ref 80.0–100.0)
Monocytes Absolute: 0.6 10*3/uL (ref 0.1–1.0)
Monocytes Relative: 16 %
Neutro Abs: 2.8 10*3/uL (ref 1.7–7.7)
Neutrophils Relative %: 75 %
Platelets: 157 10*3/uL (ref 150–400)
RBC: 4.6 MIL/uL (ref 3.87–5.11)
RDW: 19 % — ABNORMAL HIGH (ref 11.5–15.5)
WBC: 3.7 10*3/uL — ABNORMAL LOW (ref 4.0–10.5)
nRBC: 0 % (ref 0.0–0.2)

## 2020-10-11 LAB — COMPREHENSIVE METABOLIC PANEL
ALT: 18 U/L (ref 0–44)
AST: 18 U/L (ref 15–41)
Albumin: 3.5 g/dL (ref 3.5–5.0)
Alkaline Phosphatase: 47 U/L (ref 38–126)
Anion gap: 10 (ref 5–15)
BUN: 33 mg/dL — ABNORMAL HIGH (ref 8–23)
CO2: 25 mmol/L (ref 22–32)
Calcium: 10.1 mg/dL (ref 8.9–10.3)
Chloride: 99 mmol/L (ref 98–111)
Creatinine, Ser: 0.91 mg/dL (ref 0.44–1.00)
GFR, Estimated: >60 mL/min (ref >60)
Glucose, Bld: 100 mg/dL — ABNORMAL HIGH (ref 70–99)
Potassium: 4.8 mmol/L (ref 3.5–5.1)
Sodium: 134 mmol/L — ABNORMAL LOW (ref 135–145)
Total Bilirubin: 0.7 mg/dL (ref 0.3–1.2)
Total Protein: 8 g/dL (ref 6.5–8.1)

## 2020-10-11 MED ORDER — HEPARIN SOD (PORK) LOCK FLUSH 100 UNIT/ML IV SOLN
INTRAVENOUS | Status: AC
  Filled 2020-10-11: qty 5

## 2020-10-11 MED ORDER — HEPARIN SOD (PORK) LOCK FLUSH 100 UNIT/ML IV SOLN
500.0000 [IU] | Freq: Once | INTRAVENOUS | Status: AC
  Administered 2020-10-11: 500 [IU] via INTRAVENOUS
  Filled 2020-10-11: qty 5

## 2020-10-11 MED ORDER — SODIUM CHLORIDE 0.9 % IV SOLN
139.0000 mg | Freq: Once | INTRAVENOUS | Status: AC
  Administered 2020-10-11: 140 mg via INTRAVENOUS
  Filled 2020-10-11: qty 14

## 2020-10-11 MED ORDER — FAMOTIDINE 20 MG IN NS 100 ML IVPB
20.0000 mg | Freq: Once | INTRAVENOUS | Status: AC
  Administered 2020-10-11: 20 mg via INTRAVENOUS
  Filled 2020-10-11: qty 20

## 2020-10-11 MED ORDER — SODIUM CHLORIDE 0.9 % IV SOLN
Freq: Once | INTRAVENOUS | Status: AC
Start: 2020-10-11 — End: 2020-10-11
  Filled 2020-10-11: qty 250

## 2020-10-11 MED ORDER — SODIUM CHLORIDE 0.9% FLUSH
10.0000 mL | Freq: Once | INTRAVENOUS | Status: AC
Start: 2020-10-11 — End: 2020-10-11
  Administered 2020-10-11: 10 mL via INTRAVENOUS
  Filled 2020-10-11: qty 10

## 2020-10-11 MED ORDER — DIPHENHYDRAMINE HCL 50 MG/ML IJ SOLN
25.0000 mg | Freq: Once | INTRAMUSCULAR | Status: AC
Start: 2020-10-11 — End: 2020-10-11
  Administered 2020-10-11: 25 mg via INTRAVENOUS
  Filled 2020-10-11: qty 1

## 2020-10-11 MED ORDER — PALONOSETRON HCL INJECTION 0.25 MG/5ML
0.2500 mg | Freq: Once | INTRAVENOUS | Status: AC
  Administered 2020-10-11: 0.25 mg via INTRAVENOUS
  Filled 2020-10-11: qty 5

## 2020-10-11 MED ORDER — SODIUM CHLORIDE 0.9 % IV SOLN
45.0000 mg/m2 | Freq: Once | INTRAVENOUS | Status: AC
  Administered 2020-10-11: 78 mg via INTRAVENOUS
  Filled 2020-10-11: qty 13

## 2020-10-11 MED ORDER — SODIUM CHLORIDE 0.9 % IV SOLN
10.0000 mg | Freq: Once | INTRAVENOUS | Status: AC
  Administered 2020-10-11: 10 mg via INTRAVENOUS
  Filled 2020-10-11: qty 10

## 2020-10-11 NOTE — Patient Instructions (Signed)
[facility]  Discharge Instructions: Thank you for choosing [facility] to provide your oncology and hematology care.  If you have a lab appointment with the [facility], please go directly to the [facility] and check in at the registration area.  Wear comfortable clothing and clothing appropriate for easy access to any Portacath or PICC line.   We strive to give you quality time with your provider. You may need to reschedule your appointment if you arrive late (15 or more minutes).  Arriving late affects you and other patients whose appointments are after yours.  Also, if you miss three or more appointments without notifying the office, you may be dismissed from the clinic at the provider's discretion.      For prescription refill requests, have your pharmacy contact our office and allow 72 hours for refills to be completed.    Today you received the following chemotherapy and/or immunotherapy agents Taxol and Carboplatin       To help prevent nausea and vomiting after your treatment, we encourage you to take your nausea medication as directed.  BELOW ARE SYMPTOMS THAT SHOULD BE REPORTED IMMEDIATELY: *FEVER GREATER THAN 100.4 F (38 C) OR HIGHER *CHILLS OR SWEATING *NAUSEA AND VOMITING THAT IS NOT CONTROLLED WITH YOUR NAUSEA MEDICATION *UNUSUAL SHORTNESS OF BREATH *UNUSUAL BRUISING OR BLEEDING *URINARY PROBLEMS (pain or burning when urinating, or frequent urination) *BOWEL PROBLEMS (unusual diarrhea, constipation, pain near the anus) TENDERNESS IN MOUTH AND THROAT WITH OR WITHOUT PRESENCE OF ULCERS (sore throat, sores in mouth, or a toothache) UNUSUAL RASH, SWELLING OR PAIN  UNUSUAL VAGINAL DISCHARGE OR ITCHING   Items with * indicate a potential emergency and should be followed up as soon as possible or go to the Emergency Department if any problems should occur.  Please show the CHEMOTHERAPY ALERT CARD or IMMUNOTHERAPY ALERT CARD  at check-in to the Emergency Department and triage nurse.  Should you have questions after your visit or need to cancel or reschedule your appointment, please contact [facility]  [phone] and follow the prompts.  Office hours are 8:00 a.m. to 4:30 p.m. Monday - Friday. Please note that voicemails left after 4:00 p.m. may not be returned until the following business day.  We are closed weekends and major holidays. You have access to a nurse at all times for urgent questions. Please call the main number to the clinic [phone] and follow the prompts.  For any non-urgent questions, you may also contact your provider using MyChart. We now offer e-Visits for anyone 71 and older to request care online for non-urgent symptoms. For details visit [email or web address].   Also download the MyChart app! Go to the app store, search "MyChart", open the app, select [facility], and log in with your MyChart username and password.  Due to Covid, a mask is required upon entering the hospital/clinic. If you do not have a mask, one will be given to you upon arrival. For doctor visits, patients may have 1 support person aged 43 or older with them. For treatment visits, patients cannot have anyone with them due to current Covid guidelines and our immunocompromised population.

## 2020-10-11 NOTE — Progress Notes (Signed)
Pt in for follow up, reports having incontinence at night.

## 2020-10-11 NOTE — Progress Notes (Signed)
Pt started to urinate prior to using cleaning wipe. Urine sample collected but unable to obtain a clean catch sample. Dr. Grayland Ormond aware, and okay to proceed with using sample.

## 2020-10-12 ENCOUNTER — Ambulatory Visit
Admission: RE | Admit: 2020-10-12 | Discharge: 2020-10-12 | Disposition: A | Discharge status: Home / Self Care | Payer: Medicare HMO | Source: Ambulatory Visit | Attending: Radiation Oncology | Admitting: Radiation Oncology

## 2020-10-12 DIAGNOSIS — N390 Urinary tract infection, site not specified: Secondary | ICD-10-CM | POA: Diagnosis not present

## 2020-10-12 DIAGNOSIS — E875 Hyperkalemia: Secondary | ICD-10-CM | POA: Diagnosis not present

## 2020-10-13 ENCOUNTER — Inpatient Hospital Stay: Payer: Medicare HMO

## 2020-10-13 ENCOUNTER — Ambulatory Visit: Payer: Medicare HMO

## 2020-10-13 ENCOUNTER — Other Ambulatory Visit: Payer: Self-pay | Admitting: *Deleted

## 2020-10-13 ENCOUNTER — Telehealth: Payer: Self-pay | Admitting: *Deleted

## 2020-10-13 MED ORDER — LEVOFLOXACIN 500 MG PO TABS: 500.0000 mg | ORAL_TABLET | Freq: Every day | ORAL | 0 refills | Status: DC

## 2020-10-13 NOTE — Telephone Encounter (Signed)
Call placed to patients sister Vivien Rota to review results of UA/UC. Patient has UTI based on results of UA and per Dr Grayland Ormond patient to start on Levaquin 500 mg daily for 5 days. Prescription sent to patients pharmacy. Patients family verbalized understanding of results and plan.

## 2020-10-13 NOTE — Progress Notes (Signed)
Nutrition  Received message from Radiation RN that patient will not be coming today due to recent fall.   [name] [name] Resides, [name], [name] Registered Dietitian [phone] (mobile)

## 2020-10-14 ENCOUNTER — Ambulatory Visit
Admission: RE | Admit: 2020-10-14 | Discharge: 2020-10-14 | Disposition: A | Discharge status: Home / Self Care | Payer: Medicare HMO | Source: Ambulatory Visit | Attending: Radiation Oncology | Admitting: Radiation Oncology

## 2020-10-14 ENCOUNTER — Ambulatory Visit: Payer: Medicare HMO

## 2020-10-14 ENCOUNTER — Inpatient Hospital Stay
Admission: EM | Admit: 2020-10-14 | Discharge: 2020-10-22 | DRG: 689 | Disposition: A | Discharge status: Skilled Nursing Facility | Payer: Medicare HMO | Attending: Student | Admitting: Student

## 2020-10-14 ENCOUNTER — Other Ambulatory Visit: Payer: Self-pay

## 2020-10-14 ENCOUNTER — Emergency Department: Payer: Medicare HMO

## 2020-10-14 DIAGNOSIS — Z8249 Family history of ischemic heart disease and other diseases of the circulatory system: Secondary | ICD-10-CM

## 2020-10-14 DIAGNOSIS — Z20822 Contact with and (suspected) exposure to covid-19: Secondary | ICD-10-CM | POA: Diagnosis present

## 2020-10-14 DIAGNOSIS — E669 Obesity, unspecified: Secondary | ICD-10-CM | POA: Diagnosis present

## 2020-10-14 DIAGNOSIS — R7989 Other specified abnormal findings of blood chemistry: Secondary | ICD-10-CM

## 2020-10-14 DIAGNOSIS — B964 Proteus (mirabilis) (morganii) as the cause of diseases classified elsewhere: Secondary | ICD-10-CM | POA: Diagnosis present

## 2020-10-14 DIAGNOSIS — W109XXA Fall (on) (from) unspecified stairs and steps, initial encounter: Secondary | ICD-10-CM | POA: Diagnosis present

## 2020-10-14 DIAGNOSIS — F419 Anxiety disorder, unspecified: Secondary | ICD-10-CM | POA: Diagnosis present

## 2020-10-14 DIAGNOSIS — Z79899 Other long term (current) drug therapy: Secondary | ICD-10-CM

## 2020-10-14 DIAGNOSIS — Z87891 Personal history of nicotine dependence: Secondary | ICD-10-CM

## 2020-10-14 DIAGNOSIS — I442 Atrioventricular block, complete: Secondary | ICD-10-CM | POA: Diagnosis present

## 2020-10-14 DIAGNOSIS — I471 Supraventricular tachycardia: Secondary | ICD-10-CM | POA: Diagnosis present

## 2020-10-14 DIAGNOSIS — I10 Essential (primary) hypertension: Secondary | ICD-10-CM | POA: Diagnosis present

## 2020-10-14 DIAGNOSIS — I4891 Unspecified atrial fibrillation: Secondary | ICD-10-CM | POA: Diagnosis present

## 2020-10-14 DIAGNOSIS — I2510 Atherosclerotic heart disease of native coronary artery without angina pectoris: Secondary | ICD-10-CM | POA: Diagnosis present

## 2020-10-14 DIAGNOSIS — E43 Unspecified severe protein-calorie malnutrition: Secondary | ICD-10-CM | POA: Insufficient documentation

## 2020-10-14 DIAGNOSIS — N183 Chronic kidney disease, stage 3 unspecified: Secondary | ICD-10-CM | POA: Diagnosis present

## 2020-10-14 DIAGNOSIS — M109 Gout, unspecified: Secondary | ICD-10-CM | POA: Diagnosis present

## 2020-10-14 DIAGNOSIS — Y92009 Unspecified place in unspecified non-institutional (private) residence as the place of occurrence of the external cause: Secondary | ICD-10-CM

## 2020-10-14 DIAGNOSIS — Z923 Personal history of irradiation: Secondary | ICD-10-CM

## 2020-10-14 DIAGNOSIS — T451X5A Adverse effect of antineoplastic and immunosuppressive drugs, initial encounter: Secondary | ICD-10-CM | POA: Diagnosis present

## 2020-10-14 DIAGNOSIS — I248 Other forms of acute ischemic heart disease: Secondary | ICD-10-CM | POA: Diagnosis present

## 2020-10-14 DIAGNOSIS — Z23 Encounter for immunization: Secondary | ICD-10-CM

## 2020-10-14 DIAGNOSIS — Z170 Estrogen receptor positive status [ER+]: Secondary | ICD-10-CM

## 2020-10-14 DIAGNOSIS — Z79811 Long term (current) use of aromatase inhibitors: Secondary | ICD-10-CM

## 2020-10-14 DIAGNOSIS — I959 Hypotension, unspecified: Secondary | ICD-10-CM | POA: Diagnosis present

## 2020-10-14 DIAGNOSIS — C50411 Malignant neoplasm of upper-outer quadrant of right female breast: Secondary | ICD-10-CM | POA: Diagnosis present

## 2020-10-14 DIAGNOSIS — D61810 Antineoplastic chemotherapy induced pancytopenia: Secondary | ICD-10-CM | POA: Diagnosis present

## 2020-10-14 DIAGNOSIS — R531 Weakness: Secondary | ICD-10-CM

## 2020-10-14 DIAGNOSIS — S80212A Abrasion, left knee, initial encounter: Secondary | ICD-10-CM | POA: Diagnosis present

## 2020-10-14 DIAGNOSIS — Z8673 Personal history of transient ischemic attack (TIA), and cerebral infarction without residual deficits: Secondary | ICD-10-CM

## 2020-10-14 DIAGNOSIS — E871 Hypo-osmolality and hyponatremia: Secondary | ICD-10-CM | POA: Diagnosis present

## 2020-10-14 DIAGNOSIS — E860 Dehydration: Secondary | ICD-10-CM

## 2020-10-14 DIAGNOSIS — Z9011 Acquired absence of right breast and nipple: Secondary | ICD-10-CM

## 2020-10-14 DIAGNOSIS — N390 Urinary tract infection, site not specified: Principal | ICD-10-CM

## 2020-10-14 DIAGNOSIS — Z803 Family history of malignant neoplasm of breast: Secondary | ICD-10-CM

## 2020-10-14 DIAGNOSIS — C3492 Malignant neoplasm of unspecified part of left bronchus or lung: Secondary | ICD-10-CM

## 2020-10-14 DIAGNOSIS — R634 Abnormal weight loss: Secondary | ICD-10-CM | POA: Diagnosis present

## 2020-10-14 DIAGNOSIS — I480 Paroxysmal atrial fibrillation: Secondary | ICD-10-CM | POA: Diagnosis present

## 2020-10-14 DIAGNOSIS — E875 Hyperkalemia: Secondary | ICD-10-CM

## 2020-10-14 DIAGNOSIS — Z6825 Body mass index (BMI) 25.0-25.9, adult: Secondary | ICD-10-CM

## 2020-10-14 DIAGNOSIS — K219 Gastro-esophageal reflux disease without esophagitis: Secondary | ICD-10-CM | POA: Diagnosis present

## 2020-10-14 DIAGNOSIS — E785 Hyperlipidemia, unspecified: Secondary | ICD-10-CM | POA: Diagnosis present

## 2020-10-14 DIAGNOSIS — N1831 Chronic kidney disease, stage 3a: Secondary | ICD-10-CM | POA: Diagnosis present

## 2020-10-14 DIAGNOSIS — R778 Other specified abnormalities of plasma proteins: Secondary | ICD-10-CM | POA: Diagnosis present

## 2020-10-14 DIAGNOSIS — C3491 Malignant neoplasm of unspecified part of right bronchus or lung: Secondary | ICD-10-CM | POA: Diagnosis present

## 2020-10-14 DIAGNOSIS — M6282 Rhabdomyolysis: Secondary | ICD-10-CM | POA: Diagnosis present

## 2020-10-14 DIAGNOSIS — Z7902 Long term (current) use of antithrombotics/antiplatelets: Secondary | ICD-10-CM

## 2020-10-14 DIAGNOSIS — W19XXXA Unspecified fall, initial encounter: Secondary | ICD-10-CM

## 2020-10-14 DIAGNOSIS — Z950 Presence of cardiac pacemaker: Secondary | ICD-10-CM

## 2020-10-14 DIAGNOSIS — I129 Hypertensive chronic kidney disease with stage 1 through stage 4 chronic kidney disease, or unspecified chronic kidney disease: Secondary | ICD-10-CM | POA: Diagnosis present

## 2020-10-14 HISTORY — DX: Enterocolitis due to Clostridium difficile, not specified as recurrent: A04.72

## 2020-10-14 HISTORY — DX: Pulmonary hypertension, unspecified: I27.20

## 2020-10-14 HISTORY — DX: Chronic kidney disease, stage 3 unspecified: N18.30

## 2020-10-14 HISTORY — DX: Rhabdomyolysis: M62.82

## 2020-10-14 HISTORY — DX: Paroxysmal atrial fibrillation: I48.0

## 2020-10-14 HISTORY — DX: Gout, unspecified: M10.9

## 2020-10-14 LAB — RESP PANEL BY RT-PCR (FLU A&B, COVID) ARPGX2
Influenza A by PCR: NEGATIVE
Influenza B by PCR: NEGATIVE
SARS Coronavirus 2 by RT PCR: NEGATIVE

## 2020-10-14 LAB — CBC WITH DIFFERENTIAL/PLATELET
Abs Immature Granulocytes: 0.01 10*3/uL (ref 0.00–0.07)
Basophils Absolute: 0 10*3/uL (ref 0.0–0.1)
Basophils Relative: 0 %
Eosinophils Absolute: 0 10*3/uL (ref 0.0–0.5)
Eosinophils Relative: 0 %
HCT: 42 % (ref 36.0–46.0)
Hemoglobin: 13.3 g/dL (ref 12.0–15.0)
Immature Granulocytes: 0 %
Lymphocytes Relative: 3 %
Lymphs Abs: 0.1 10*3/uL — ABNORMAL LOW (ref 0.7–4.0)
MCH: 26.2 pg (ref 26.0–34.0)
MCHC: 31.7 g/dL (ref 30.0–36.0)
MCV: 82.7 fL (ref 80.0–100.0)
Monocytes Absolute: 0 10*3/uL — ABNORMAL LOW (ref 0.1–1.0)
Monocytes Relative: 1 %
Neutro Abs: 3.6 10*3/uL (ref 1.7–7.7)
Neutrophils Relative %: 96 %
Platelets: 128 10*3/uL — ABNORMAL LOW (ref 150–400)
RBC: 5.08 MIL/uL (ref 3.87–5.11)
RDW: 18.7 % — ABNORMAL HIGH (ref 11.5–15.5)
WBC: 3.8 10*3/uL — ABNORMAL LOW (ref 4.0–10.5)
nRBC: 0 % (ref 0.0–0.2)

## 2020-10-14 LAB — TROPONIN I (HIGH SENSITIVITY): Troponin I (High Sensitivity): 103 ng/L (ref <18)

## 2020-10-14 LAB — COMPREHENSIVE METABOLIC PANEL
ALT: 24 U/L (ref 0–44)
AST: 24 U/L (ref 15–41)
Albumin: 3.8 g/dL (ref 3.5–5.0)
Alkaline Phosphatase: 44 U/L (ref 38–126)
Anion gap: 10 (ref 5–15)
BUN: 53 mg/dL — ABNORMAL HIGH (ref 8–23)
CO2: 21 mmol/L — ABNORMAL LOW (ref 22–32)
Calcium: 10.4 mg/dL — ABNORMAL HIGH (ref 8.9–10.3)
Chloride: 99 mmol/L (ref 98–111)
Creatinine, Ser: 1.05 mg/dL — ABNORMAL HIGH (ref 0.44–1.00)
GFR, Estimated: 52 mL/min — ABNORMAL LOW (ref >60)
Glucose, Bld: 141 mg/dL — ABNORMAL HIGH (ref 70–99)
Potassium: 5.9 mmol/L — ABNORMAL HIGH (ref 3.5–5.1)
Sodium: 130 mmol/L — ABNORMAL LOW (ref 135–145)
Total Bilirubin: 1.1 mg/dL (ref 0.3–1.2)
Total Protein: 8.4 g/dL — ABNORMAL HIGH (ref 6.5–8.1)

## 2020-10-14 LAB — URINE CULTURE: Culture: >=100000 — AB

## 2020-10-14 LAB — LIPASE, BLOOD: Lipase: 32 U/L (ref 11–51)

## 2020-10-14 MED ORDER — TETANUS-DIPHTH-ACELL PERTUSSIS 5-2.5-18.5 LF-MCG/0.5 IM SUSY
0.5000 mL | PREFILLED_SYRINGE | Freq: Once | INTRAMUSCULAR | Status: AC
  Administered 2020-10-14: 0.5 mL via INTRAMUSCULAR
  Filled 2020-10-14: qty 0.5

## 2020-10-14 MED ORDER — LACTATED RINGERS IV BOLUS
1000.0000 mL | Freq: Once | INTRAVENOUS | Status: AC
  Administered 2020-10-14: 1000 mL via INTRAVENOUS

## 2020-10-14 MED ORDER — ONDANSETRON HCL 4 MG/2ML IJ SOLN
4.0000 mg | Freq: Once | INTRAMUSCULAR | Status: AC
  Administered 2020-10-14: 4 mg via INTRAVENOUS
  Filled 2020-10-14: qty 2

## 2020-10-15 ENCOUNTER — Other Ambulatory Visit: Payer: Self-pay

## 2020-10-15 ENCOUNTER — Encounter: Payer: Self-pay | Admitting: Internal Medicine

## 2020-10-15 ENCOUNTER — Inpatient Hospital Stay (HOSPITAL_COMMUNITY)
Admit: 2020-10-15 | Discharge: 2020-10-15 | Disposition: A | Discharge status: Home / Self Care | Payer: Medicare HMO | Attending: Cardiovascular Disease | Admitting: Cardiovascular Disease

## 2020-10-15 ENCOUNTER — Inpatient Hospital Stay: Payer: Medicare HMO

## 2020-10-15 DIAGNOSIS — Y92009 Unspecified place in unspecified non-institutional (private) residence as the place of occurrence of the external cause: Secondary | ICD-10-CM

## 2020-10-15 DIAGNOSIS — E860 Dehydration: Secondary | ICD-10-CM

## 2020-10-15 DIAGNOSIS — I471 Supraventricular tachycardia: Secondary | ICD-10-CM | POA: Diagnosis present

## 2020-10-15 DIAGNOSIS — Z8673 Personal history of transient ischemic attack (TIA), and cerebral infarction without residual deficits: Secondary | ICD-10-CM

## 2020-10-15 DIAGNOSIS — W19XXXA Unspecified fall, initial encounter: Secondary | ICD-10-CM

## 2020-10-15 DIAGNOSIS — E875 Hyperkalemia: Secondary | ICD-10-CM | POA: Diagnosis present

## 2020-10-15 DIAGNOSIS — E785 Hyperlipidemia, unspecified: Secondary | ICD-10-CM | POA: Diagnosis present

## 2020-10-15 DIAGNOSIS — M6282 Rhabdomyolysis: Secondary | ICD-10-CM | POA: Diagnosis present

## 2020-10-15 DIAGNOSIS — N183 Chronic kidney disease, stage 3 unspecified: Secondary | ICD-10-CM | POA: Diagnosis present

## 2020-10-15 DIAGNOSIS — I2510 Atherosclerotic heart disease of native coronary artery without angina pectoris: Secondary | ICD-10-CM | POA: Diagnosis present

## 2020-10-15 DIAGNOSIS — I480 Paroxysmal atrial fibrillation: Secondary | ICD-10-CM | POA: Diagnosis not present

## 2020-10-15 DIAGNOSIS — R778 Other specified abnormalities of plasma proteins: Secondary | ICD-10-CM

## 2020-10-15 DIAGNOSIS — I442 Atrioventricular block, complete: Secondary | ICD-10-CM

## 2020-10-15 DIAGNOSIS — W109XXA Fall (on) (from) unspecified stairs and steps, initial encounter: Secondary | ICD-10-CM | POA: Diagnosis present

## 2020-10-15 DIAGNOSIS — N3000 Acute cystitis without hematuria: Secondary | ICD-10-CM

## 2020-10-15 DIAGNOSIS — R634 Abnormal weight loss: Secondary | ICD-10-CM | POA: Diagnosis present

## 2020-10-15 DIAGNOSIS — E871 Hypo-osmolality and hyponatremia: Secondary | ICD-10-CM | POA: Diagnosis present

## 2020-10-15 DIAGNOSIS — C3492 Malignant neoplasm of unspecified part of left bronchus or lung: Secondary | ICD-10-CM | POA: Diagnosis present

## 2020-10-15 DIAGNOSIS — N390 Urinary tract infection, site not specified: Principal | ICD-10-CM

## 2020-10-15 DIAGNOSIS — Z23 Encounter for immunization: Secondary | ICD-10-CM | POA: Diagnosis present

## 2020-10-15 DIAGNOSIS — Z170 Estrogen receptor positive status [ER+]: Secondary | ICD-10-CM | POA: Diagnosis not present

## 2020-10-15 DIAGNOSIS — D61810 Antineoplastic chemotherapy induced pancytopenia: Secondary | ICD-10-CM | POA: Diagnosis present

## 2020-10-15 DIAGNOSIS — C3491 Malignant neoplasm of unspecified part of right bronchus or lung: Secondary | ICD-10-CM

## 2020-10-15 DIAGNOSIS — C50411 Malignant neoplasm of upper-outer quadrant of right female breast: Secondary | ICD-10-CM | POA: Diagnosis present

## 2020-10-15 DIAGNOSIS — R531 Weakness: Secondary | ICD-10-CM

## 2020-10-15 DIAGNOSIS — I4891 Unspecified atrial fibrillation: Secondary | ICD-10-CM

## 2020-10-15 DIAGNOSIS — N1831 Chronic kidney disease, stage 3a: Secondary | ICD-10-CM | POA: Diagnosis present

## 2020-10-15 DIAGNOSIS — Z20822 Contact with and (suspected) exposure to covid-19: Secondary | ICD-10-CM | POA: Diagnosis present

## 2020-10-15 DIAGNOSIS — I25118 Atherosclerotic heart disease of native coronary artery with other forms of angina pectoris: Secondary | ICD-10-CM

## 2020-10-15 DIAGNOSIS — E43 Unspecified severe protein-calorie malnutrition: Secondary | ICD-10-CM | POA: Diagnosis present

## 2020-10-15 DIAGNOSIS — R9431 Abnormal electrocardiogram [ECG] [EKG]: Secondary | ICD-10-CM | POA: Diagnosis not present

## 2020-10-15 DIAGNOSIS — I10 Essential (primary) hypertension: Secondary | ICD-10-CM

## 2020-10-15 DIAGNOSIS — E669 Obesity, unspecified: Secondary | ICD-10-CM | POA: Diagnosis present

## 2020-10-15 DIAGNOSIS — I129 Hypertensive chronic kidney disease with stage 1 through stage 4 chronic kidney disease, or unspecified chronic kidney disease: Secondary | ICD-10-CM | POA: Diagnosis present

## 2020-10-15 DIAGNOSIS — A419 Sepsis, unspecified organism: Secondary | ICD-10-CM

## 2020-10-15 DIAGNOSIS — I248 Other forms of acute ischemic heart disease: Secondary | ICD-10-CM | POA: Diagnosis present

## 2020-10-15 DIAGNOSIS — B964 Proteus (mirabilis) (morganii) as the cause of diseases classified elsewhere: Secondary | ICD-10-CM | POA: Diagnosis present

## 2020-10-15 LAB — CK: Total CK: 31 U/L — ABNORMAL LOW (ref 38–234)

## 2020-10-15 LAB — PROCALCITONIN: Procalcitonin: 0.11 ng/mL

## 2020-10-15 LAB — TROPONIN I (HIGH SENSITIVITY)
Troponin I (High Sensitivity): 87 ng/L — ABNORMAL HIGH (ref <18)
Troponin I (High Sensitivity): 56 ng/L — ABNORMAL HIGH (ref <18)
Troponin I (High Sensitivity): 43 ng/L — ABNORMAL HIGH (ref <18)

## 2020-10-15 LAB — POTASSIUM: Potassium: 5.8 mmol/L — ABNORMAL HIGH (ref 3.5–5.1)

## 2020-10-15 LAB — ECHOCARDIOGRAM COMPLETE
AR max vel: 2.6 cm2
AV Peak grad: 5.1 mmHg
Ao pk vel: 1.13 m/s
Area-P 1/2: 3.2 cm2
S' Lateral: 2.7 cm

## 2020-10-15 LAB — MAGNESIUM: Magnesium: 1.6 mg/dL — ABNORMAL LOW (ref 1.7–2.4)

## 2020-10-15 LAB — LACTIC ACID, PLASMA
Lactic Acid, Venous: 2.7 mmol/L (ref 0.5–1.9)
Lactic Acid, Venous: 2.6 mmol/L (ref 0.5–1.9)

## 2020-10-15 MED ORDER — CEFAZOLIN SODIUM-DEXTROSE 1-4 GM/50ML-% IV SOLN
1.0000 g | Freq: Three times a day (TID) | INTRAVENOUS | Status: DC
  Administered 2020-10-15 – 2020-10-18 (×8): 1 g via INTRAVENOUS
  Filled 2020-10-15 (×15): qty 50

## 2020-10-15 MED ORDER — ACETAMINOPHEN 325 MG PO TABS: 650.0000 mg | ORAL_TABLET | Freq: Four times a day (QID) | ORAL | Status: DC | PRN

## 2020-10-15 MED ORDER — LETROZOLE 2.5 MG PO TABS
2.5000 mg | ORAL_TABLET | Freq: Every day | ORAL | Status: DC
  Administered 2020-10-15 – 2020-10-21 (×7): 2.5 mg via ORAL
  Filled 2020-10-15 (×10): qty 1

## 2020-10-15 MED ORDER — LIDOCAINE-PRILOCAINE 2.5-2.5 % EX CREA
TOPICAL_CREAM | Freq: Every day | CUTANEOUS | Status: DC | PRN
  Filled 2020-10-15: qty 5

## 2020-10-15 MED ORDER — SODIUM CHLORIDE 0.9 % IV SOLN: INTRAVENOUS | Status: DC

## 2020-10-15 MED ORDER — SIMVASTATIN 20 MG PO TABS
40.0000 mg | ORAL_TABLET | Freq: Every day | ORAL | Status: DC
  Administered 2020-10-15 – 2020-10-21 (×7): 40 mg via ORAL
  Filled 2020-10-15 (×2): qty 2
  Filled 2020-10-15: qty 4
  Filled 2020-10-15 (×4): qty 2

## 2020-10-15 MED ORDER — ENOXAPARIN SODIUM 40 MG/0.4ML IJ SOSY
40.0000 mg | PREFILLED_SYRINGE | INTRAMUSCULAR | Status: DC
  Administered 2020-10-16: 40 mg via SUBCUTANEOUS
  Filled 2020-10-15: qty 0.4

## 2020-10-15 MED ORDER — PERFLUTREN LIPID MICROSPHERE
1.0000 mL | INTRAVENOUS | Status: AC | PRN
  Administered 2020-10-15: 4 mL via INTRAVENOUS
  Filled 2020-10-15: qty 10

## 2020-10-15 MED ORDER — PANTOPRAZOLE SODIUM 40 MG PO TBEC
40.0000 mg | DELAYED_RELEASE_TABLET | Freq: Every day | ORAL | Status: DC
  Administered 2020-10-16 – 2020-10-22 (×7): 40 mg via ORAL
  Filled 2020-10-15 (×7): qty 1

## 2020-10-15 MED ORDER — AMLODIPINE BESYLATE 5 MG PO TABS
5.0000 mg | ORAL_TABLET | Freq: Every day | ORAL | Status: DC
  Administered 2020-10-16: 5 mg via ORAL
  Filled 2020-10-15: qty 1

## 2020-10-15 MED ORDER — HYDRALAZINE HCL 20 MG/ML IJ SOLN: 5.0000 mg | INTRAMUSCULAR | Status: DC | PRN

## 2020-10-15 MED ORDER — LACTATED RINGERS IV BOLUS
500.0000 mL | Freq: Once | INTRAVENOUS | Status: AC
  Administered 2020-10-15: 500 mL via INTRAVENOUS

## 2020-10-15 MED ORDER — ASPIRIN 81 MG PO CHEW: 324.0000 mg | CHEWABLE_TABLET | Freq: Once | ORAL | Status: DC

## 2020-10-15 MED ORDER — LETROZOLE 2.5 MG PO TABS
2.5000 mg | ORAL_TABLET | Freq: Every day | ORAL | Status: DC
  Filled 2020-10-15: qty 1

## 2020-10-15 MED ORDER — HYDROXYZINE HCL 25 MG PO TABS
25.0000 mg | ORAL_TABLET | Freq: Three times a day (TID) | ORAL | Status: DC | PRN
  Filled 2020-10-15: qty 1

## 2020-10-15 MED ORDER — LATANOPROST 0.005 % OP SOLN
1.0000 [drp] | Freq: Every day | OPHTHALMIC | Status: DC
  Administered 2020-10-16 – 2020-10-21 (×7): 1 [drp] via OPHTHALMIC
  Filled 2020-10-15 (×2): qty 2.5

## 2020-10-15 MED ORDER — PROCHLORPERAZINE MALEATE 10 MG PO TABS
10.0000 mg | ORAL_TABLET | Freq: Four times a day (QID) | ORAL | Status: DC | PRN
  Filled 2020-10-15: qty 1

## 2020-10-15 MED ORDER — DEXAMETHASONE 4 MG PO TABS
4.0000 mg | ORAL_TABLET | Freq: Every day | ORAL | Status: DC
  Administered 2020-10-16 – 2020-10-22 (×7): 4 mg via ORAL
  Filled 2020-10-15 (×8): qty 1

## 2020-10-15 MED ORDER — SODIUM CHLORIDE 0.9 % IV SOLN: 1.0000 g | INTRAVENOUS | Status: DC

## 2020-10-15 MED ORDER — SODIUM ZIRCONIUM CYCLOSILICATE 10 G PO PACK
10.0000 g | PACK | Freq: Once | ORAL | Status: AC
  Administered 2020-10-15: 10 g via ORAL
  Filled 2020-10-15: qty 1

## 2020-10-15 MED ORDER — SODIUM CHLORIDE 0.9 % IV SOLN
1.0000 g | Freq: Once | INTRAVENOUS | Status: AC
  Administered 2020-10-15: 1 g via INTRAVENOUS
  Filled 2020-10-15: qty 10

## 2020-10-15 MED ORDER — ASPIRIN 81 MG PO CHEW
324.0000 mg | CHEWABLE_TABLET | Freq: Once | ORAL | Status: AC
  Administered 2020-10-15: 324 mg via ORAL
  Filled 2020-10-15: qty 4

## 2020-10-15 MED ORDER — MAGNESIUM SULFATE 2 GM/50ML IV SOLN
2.0000 g | Freq: Once | INTRAVENOUS | Status: AC
  Administered 2020-10-15: 2 g via INTRAVENOUS
  Filled 2020-10-15: qty 50

## 2020-10-15 MED ORDER — ALLOPURINOL 100 MG PO TABS
100.0000 mg | ORAL_TABLET | Freq: Every day | ORAL | Status: DC
  Administered 2020-10-16 – 2020-10-22 (×7): 100 mg via ORAL
  Filled 2020-10-15 (×8): qty 1

## 2020-10-15 MED ORDER — CLOPIDOGREL BISULFATE 75 MG PO TABS
75.0000 mg | ORAL_TABLET | Freq: Every day | ORAL | Status: DC
  Administered 2020-10-16 – 2020-10-22 (×7): 75 mg via ORAL
  Filled 2020-10-15 (×7): qty 1

## 2020-10-15 NOTE — Progress Notes (Signed)
*  PRELIMINARY RESULTS* Echocardiogram 2D Echocardiogram has been performed. Definity IV Contrast was used on this study.  Claretta Fraise 10/15/2020, 2:08 PM

## 2020-10-15 NOTE — ED Notes (Signed)
Pt ate 75% dinner, repositioned, no other needs at this time

## 2020-10-15 NOTE — ED Notes (Signed)
Echo bedside

## 2020-10-15 NOTE — ED Provider Notes (Signed)
  Physical Exam  BP 109/71   Pulse 62   Temp 97.6 F (36.4 C) (Oral)   Resp (!) 21   SpO2 97%   Physical Exam  ED Course/Procedures     Procedures  MDM  12:00 AM  Assumed care.  Patient is a 87 year old female with history of hypertension, lung cancer getting chemotherapy and radiation, pacemaker due to complete heart block who presented to the emergency department generalized weakness.  First troponin minimally elevated.  No old for comparison.  EKG shows no ischemic change.  She denies any chest pain.  Repeat troponin pending.  Patient currently being treated for UTI.  Potassium elevated at 5.8 without EKG changes.  3:35 AM  Pt's repeat troponin is still elevated but downtrending.  Seem to be more elevated than they were in March 2022.  Her sister reports she has had shortness of breath with exertion patient denies any chest pain, shortness of breath currently.  Has CHB that is old status post pacemaker placed in 2013, replaced in 2015.  EKG shows no new ischemic abnormality.  Pacemaker interrogated and appears to be functioning properly.  Her potassium level still elevated despite IV fluids.  Sister reports that patient was recently put on potassium tablets due to hypokalemia.  Will give Lokelma and continue to hydrate.  Discussed possibility of discharge home but sister voices concern given patient is not able to ambulate here and she lives at home by herself.  I feel that her generalized weakness may be secondary to dehydration as sister reports decreased oral intake as well as secondary to a urinary tract infection.  Urine culture on the 14th grew Proteus.  Will give Rocephin here.  Will discuss with hospitalist for admission given elevated troponins, hyperkalemia, UTI, generalized weakness.  4:01 AM Discussed patient's case with hospitalist, Dr. Sidney Ace.  I have recommended admission and patient (and family if present) agree with this plan. Admitting physician will place admission  orders.   I reviewed all nursing notes, vitals, pertinent previous records and reviewed/interpreted all EKGs, lab and urine results, imaging (as available).      Arica B[place], Delice Bison, DO 10/15/20 0401

## 2020-10-15 NOTE — Consult Note (Signed)
Cardiology Consult    Patient ID: [name] MRN: [id number], DOB/AGE: 05-28-1933   Admit date: 10/14/2020 Date of Consult: 10/15/2020  Primary Physician: Suzanne Pali, NP Primary Cardiologist: Suzanne Axe, [name] Requesting Provider: Mora Bellman, [name]  Patient Profile    [name] is a 87 y.o. female with a history of CHB s/p PPM, PAF (noted on device interrogation), HTN, HL, PAH, CKD III, GERD, gout, breast cancer, R lung cancer (currently on chemo/XRT), and CVA, who is being seen today for the evaluation of HsTroponin elevation in the setting of weakness/fatigue and UTI at the request of Dr. Blaine Allison.  Past Medical History   Past Medical History:  Diagnosis Date   Breast cancer, right ([place]) 11/2018   Mastectomy   C. difficile colitis    CKD (chronic kidney disease), stage III ([place])    Complete heart block ([place])    a. Originally placed 08/2004; b. 07/2013 s/p [place] PPM (ser # [id number]).   Dyspnea    Gait abnormality 07/23/2016   GERD (gastroesophageal reflux disease)    Gout    Hemoptysis 07/25/2020   History of hypertension    History of stroke    Hyperhomocysteinemia (HCC)    Hyperlipidemia    Hypertension    Hypokalemia 07/13/2018   Low blood magnesium 07/13/2018   Lung cancer ([place])    Pacemaker-Medtronic 08/07/2011   PAF (paroxysmal atrial fibrillation) ([place])    a. 07/2020 Device interrogation-->13.3% AF burden-->longest atrial high rate episode 24:26 mins-->AT vs AF. Not on [address].   Pulmonary hypertension ([place])    a. 06/2018 Echo: EF 60-65%, no rwma, nl RV fxn, RVSP 48.51mHg. Mild AoV Ca2+.   Rhabdomyolysis    Stroke ([facility]    left sided weakness, 07/28/20- no residual    Vomiting 07/13/2018    Past Surgical History:  Procedure Laterality Date   BIOPSY  07/15/2018   Procedure: BIOPSY;  Surgeon: [name];  Location: [facility] ENDOSCOPY;  Service: Gastroenterology;;   BREAST BIOPSY Right 10/27/2018   affirm bx of mass, x clip,  INVASIVE  MAMMARY CARCINOMA   BREAST LUMPECTOMY Right 12/03/2018   BRONCHIAL BIOPSY  07/29/2020   Procedure: BRONCHIAL BIOPSIES;  Surgeon: Suzanne Coder [name];  [place]  Service: Pulmonary;;   BRONCHIAL NEEDLE ASPIRATION BIOPSY  07/29/2020   Procedure: BRONCHIAL NEEDLE ASPIRATION BIOPSIES;  Surgeon: Suzanne Coder [name];  [place]  Service: Pulmonary;;   CARDIAC CATHETERIZATION     ENDOBRONCHIAL ULTRASOUND N/A 07/29/2020   Procedure: ENDOBRONCHIAL ULTRASOUND;  Surgeon: Suzanne Coder [name];  [place] [facility]  Service: Pulmonary;  Laterality: N/A;   ESOPHAGOGASTRODUODENOSCOPY Left 07/15/2018   Procedure: ESOPHAGOGASTRODUODENOSCOPY (EGD);  Surgeon: [name];  [facility];  Service: Gastroenterology;  Laterality: Left;   HEMOSTASIS CONTROL  07/29/2020   Procedure: HEMOSTASIS CONTROL;  Surgeon: Suzanne Coder [name];  [facility];  Service: Pulmonary;;   PACEMAKER INSERTION     Medtronic Enpulse dual-chamber pacemaker   PARTIAL MASTECTOMY WITH NEEDLE LOCALIZATION AND AXILLARY SENTINEL LYMPH NODE BX Right 12/03/2018   Procedure: PARTIAL MASTECTOMY WITH NEEDLE LOCALIZATION AND AXILLARY SENTINEL LYMPH NODE BX, RIGHT;  Surgeon: [name];  Location: [facility] ORS;  Service: General;  Laterality: Right;   PERMANENT PACEMAKER GENERATOR CHANGE N/A 07/30/2013   Procedure: PERMANENT PACEMAKER GENERATOR CHANGE;  Surgeon: Suzanne Sprang [name];  [facility];  Service: Cardiovascular;  Laterality: N/A;   PORTA CATH INSERTION N/A 08/16/2020  Procedure: PORTA CATH INSERTION;  Surgeon: Suzanne Cabal, [name];  Location: [place] CV LAB;  Service: Cardiovascular;  Laterality: N/A;   VIDEO BRONCHOSCOPY N/A 07/29/2020   Procedure: VIDEO BRONCHOSCOPY WITH FLUORO;  Surgeon: [name];  Location: [place] ENDOSCOPY;  Service: Pulmonary;  Laterality: N/A;    Allergies  No Known Allergies  History of Present Illness    87 y.o.  female with a history of CHB s/p PPM, PAF (noted on device interrogation), HTN, HL, PAH, GERD, CKD III, gout, breast cancer, R lung cancer (currently on chemo/XRT), and CVA.  She initially underwent PPM placement in May 2006 in the setting of complete heart block.  Most recent gen change was in 07/2013.  Device interrogations have shown occasional atrial high rate episodes suggestive of PAF.  April 2022 interrogation notable for stable AF burden @ 13.3% w/ longest episode of 101mns.  She has not been anticoagulated.  Most recent echo in 06/2018 showed an EF 60-65%, no rwma, nl RV fxn, RVSP 48.447mg, and mild AoV Ca2+.  [name] been followed closely by oncology in the setting of R lung cancer, and has been receiving chemoRx (carboplatinum and Taxol) q Tuesday over the past 6 wks (missed last week due to neutropenia), a w/ daily XRT.  Per oncology note, she has chronic weakness and fatigue.  She received chemoRx on 6/14 and also reported increased urinary frequency and dysuria @ that time.  UA returned abnl w/ UC showing P mirabilis on 6/16, for which she was Rx levofloxacin.  Unfortunately, between 6/14 and 6/16, [name] progressive fatigue and leg weakness.  On 6/16, she fell while walking down the stairs in front of her house and scraped up her left knee.  B/c of this, she skipped XRT on 6/16, but did present for XRT on 6/17.  Following XRT 6/17, she remained very weak and unsteady on her feet.  Her family didn't feel that it was safe for her to walk and called EMS late in the evening.  [name] transported to the ED where she was found to be hemodynamically stable and afebrile.  PPM interrogation was nl.  Labs notable for mild HsTrop elevation @ 103  87  56.  Pt denied c/p.  BUN and creat also elevated above baseline @ 53/1.05.  Head CT and CXR w/o acute findings.  Pt currently asymptomatic, lying in stretcher.  We've been asked to eval in the setting of mild HsTrop  elevation.  Inpatient Medications     Current Facility-Administered Medications:    0.9 %  sodium chloride infusion, , Intravenous, Continuous, Suzanne CostaMD, Last Rate: 75 mL/hr at 10/15/20 0945, 75 mL/hr at 10/15/20 0945   acetaminophen (TYLENOL) tablet 650 mg, 650 mg, Oral, Q6H PRN, Suzanne CostaMD   ceFAZolin (ANCEF) IVPB 1 g/50 mL premix, 1 g, Intravenous, Q8H, Suzanne CostaMD   hydrALAZINE (APRESOLINE) injection 5 mg, 5 mg, Intravenous, Q2H PRN, Suzanne CostaMD   hydrOXYzine (ATARAX/VISTARIL) tablet 25 mg, 25 mg, Oral, TID PRN, Suzanne CostaMD   lactated ringers bolus 500 mL, 500 mL, Intravenous, Once, Suzanne CostaMD   magnesium sulfate IVPB 2 g 50 mL, 2 g, Intravenous, Once, Suzanne CostaMD  Current Outpatient Medications:    alendronate (FOSAMAX) 70 MG tablet, TAKE 1 TABLET (70 MG TOTAL) BY MOUTH ONCE A WEEK. TAKE WITH A FULL GLASS OF WATER ON AN EMPTY STOMACH., Disp: 12 tablet, Rfl: 2   allopurinol (ZYLOPRIM) 100  MG tablet, Take 100 mg by mouth daily., Disp: , Rfl:    amLODipine (NORVASC) 5 MG tablet, Take 5 mg by mouth daily., Disp: , Rfl:    clopidogrel (PLAVIX) 75 MG tablet, Take 75 mg by mouth daily., Disp: , Rfl:    dexamethasone (DECADRON) 4 MG tablet, Take 1 tablet (4 mg total) by mouth daily., Disp: 30 tablet, Rfl: 1   hydrochlorothiazide (HYDRODIURIL) 25 MG tablet, Take 25 mg by mouth daily., Disp: , Rfl:    letrozole (FEMARA) 2.5 MG tablet, TAKE 1 TABLET BY MOUTH EVERY DAY (Patient taking differently: Take 2.5 mg by mouth daily.), Disp: 90 tablet, Rfl: 3   levofloxacin (LEVAQUIN) 500 MG tablet, Take 1 tablet (500 mg total) by mouth daily., Disp: 5 tablet, Rfl: 0   pantoprazole (PROTONIX) 40 MG tablet, Take 40 mg by mouth daily., Disp: , Rfl:    potassium chloride SA (KLOR-CON) 20 MEQ tablet, Take 1 tablet (20 mEq total) by mouth 2 (two) times daily., Disp: 60 tablet, Rfl: 2   acetaminophen (TYLENOL) 500 MG tablet, Take 500-1,000 mg by mouth every 6 (six) hours as needed for  moderate pain. , Disp: , Rfl:    Blood Pressure KIT, Automated blood pressure measuring device., Disp: 1 each, Rfl: 0   lidocaine-prilocaine (EMLA) cream, Apply to affected area once, Disp: 30 g, Rfl: 3   LUMIGAN 0.01 % SOLN, Place 1 drop into both eyes at bedtime., Disp: , Rfl: 4   ondansetron (ZOFRAN) 8 MG tablet, Take 1 tablet (8 mg total) by mouth 2 (two) times daily as needed for refractory nausea / vomiting., Disp: 60 tablet, Rfl: 2   prochlorperazine (COMPAZINE) 10 MG tablet, Take 1 tablet (10 mg total) by mouth every 6 (six) hours as needed (Nausea or vomiting)., Disp: 60 tablet, Rfl: 2   simvastatin (ZOCOR) 40 MG tablet, Take 1 tablet (40 mg total) by mouth daily at 6 PM., Disp: 90 tablet, Rfl: 3  Facility-Administered Medications Ordered in Other Encounters:    sodium chloride flush (NS) 0.9 % injection 10 mL, 10 mL, Intravenous, PRN, [name], [name], 10 mL at 09/13/20 0051   Family History    Family History  Problem Relation Age of Onset   Hypertension Mother    Breast cancer Sister    Breast cancer Sister    Colon cancer Neg Hx    Esophageal cancer Neg Hx    She indicated that her mother is deceased. She indicated that her father is deceased. She indicated that two of her three sisters are alive. She indicated that only one of her two brothers is alive. She indicated that her maternal grandmother is deceased. She indicated that her maternal grandfather is deceased. She indicated that her paternal grandmother is deceased. She indicated that her paternal grandfather is deceased. She indicated that the status of her neg hx is unknown.   Social History    Social History   Socioeconomic History   Marital status: Single    Spouse name: Not on file   Number of children: 3   Years of education: 12   Highest education level: Not on file  Occupational History   Occupation: Retired  Tobacco Use   Smoking status: Former    Pack years: 0.00    Types: Cigarettes    Quit  date: 07/25/2006    Years since quitting: 14.2   Smokeless tobacco: Never  Vaping Use   Vaping Use: Never used  Substance and Sexual Activity  Alcohol use: No   Drug use: No   Sexual activity: Not on file  Other Topics Concern   Not on file  Social History Narrative   Lives locally by herself.  Family nearby, but she arranges all of her own medicines and takes care of her home.   Caffeine use: Coffee daily   Right handed   Social Determinants of Health   Financial Resource Strain: Not on file  Food Insecurity: Not on file  Transportation Needs: Not on file  Physical Activity: Not on file  Stress: Not on file  Social Connections: Not on file  Intimate Partner Violence: Not on file     Review of Systems    General:  No chills, fever, night sweats or weight changes. +++ generalized malaise/fatigue/weakness Cardiovascular:  No chest pain, dyspnea on exertion, edema, orthopnea, palpitations, paroxysmal nocturnal dyspnea. Dermatological: No rash, lesions/masses Respiratory: No cough, dyspnea Urologic: No hematuria, +++ dysuria, +++ polyuria. Abdominal:   No nausea, vomiting, diarrhea, bright red blood per rectum, melena, or hematemesis Neurologic:  No visual changes, unilateral wkns, changes in mental status. All other systems reviewed and are otherwise negative except as noted above.  Physical Exam    Blood pressure 112/63, pulse (!) 59, temperature 97.6 F (36.4 C), temperature source Oral, resp. rate (!) 21, SpO2 100 %.  General: Pleasant, NAD Psych: Normal affect. Neuro: Alert and oriented X 3. Moves all extremities spontaneously. HEENT: Normal  Neck: Supple without bruits or JVD. Lungs:  Resp regular and unlabored, CTA. Heart: RRR no s3, s4, or murmurs. Abdomen: Soft, non-tender, non-distended, BS + x 4.  Extremities: No clubbing, cyanosis or edema. DP/PT2+, Radials 2+ and equal bilaterally.  Labs    Cardiac Enzymes Recent Labs  Lab 10/14/20 2148 10/15/20 0032  10/15/20 1044  TROPONINIHS 103* 87* 56*      Lab Results  Component Value Date   WBC 3.8 (L) 10/14/2020   HGB 13.3 10/14/2020   HCT 42.0 10/14/2020   MCV 82.7 10/14/2020   PLT 128 (L) 10/14/2020    Recent Labs  Lab 10/14/20 2148 10/15/20 0233  NA 130*  --   K 5.9* 5.8*  CL 99  --   CO2 21*  --   BUN 53*  --   CREATININE 1.05*  --   CALCIUM 10.4*  --   PROT 8.4*  --   BILITOT 1.1  --   ALKPHOS 44  --   ALT 24  --   AST 24  --   GLUCOSE 141*  --    Lab Results  Component Value Date   CHOL 65 07/14/2018   HDL 23 (L) 07/14/2018   LDLCALC 27 07/14/2018   TRIG 76 07/14/2018   Lab Results  Component Value Date   DDIMER 0.80 (H) 07/24/2020     Radiology Studies    CT HEAD WO CONTRAST  Result Date: 10/15/2020 CLINICAL DATA:  Head trauma, minor. EXAM: CT HEAD WITHOUT CONTRAST TECHNIQUE: Contiguous axial images were obtained from the base of the skull through the vertex without intravenous contrast. COMPARISON:  Prior head CT examinations 08/15/2020 and earlier. FINDINGS: Brain: Mildly motion degraded exam. Moderate cerebral atrophy.  Comparatively mild cerebellar atrophy. Redemonstrated chronic lacunar infarcts within the left internal capsule as well as right basal ganglia and adjacent radiating white matter tracts. Background moderate patchy and ill-defined hypoattenuation within the cerebral white matter, nonspecific but compatible with chronic small vessel ischemic disease. Redemonstrated chronic infarct within the inferior left cerebellar hemisphere. There  is no acute intracranial hemorrhage. No demarcated cortical infarct. No extra-axial fluid collection. No evidence of intracranial mass. No midline shift. Vascular: No hyperdense vessel.  Atherosclerotic calcifications. Skull: Normal. Negative for fracture or focal lesion. Sinuses/Orbits: Visualized orbits show no acute finding. Small volume frothy secretions and mild mucosal thickening within the bilateral maxillary  sinuses. Mild bilateral ethmoid sinus mucosal thickening. IMPRESSION: No evidence of acute intracranial abnormality. Stable parenchymal atrophy and chronic small vessel ischemic disease with chronic lacunar infarcts, as described. Redemonstrated chronic infarct within the inferior left cerebellar hemisphere. Paranasal sinus disease at the imaged levels, as described. Electronically Signed   By: Kellie Simmering DO   On: 10/15/2020 09:28   DG Chest Portable 1 View  Result Date: 10/14/2020 CLINICAL DATA:  Recent fall with weakness, initial encounter EXAM: PORTABLE CHEST 1 VIEW COMPARISON:  07/24/2020, PET-CT from 08/15/2020 FINDINGS: Right-sided chest wall port is now seen in satisfactory position. Cardiac shadow is stable. Pacing device is again noted. Improved aeration in the right base is noted. Nodular density is noted in the right base similar to that seen on prior PET-CT. IMPRESSION: Nodular density in the right lung base similar to that seen on prior PET-CT. The large central area of mass and consolidation has resolved on the right. Electronically Signed   By: Inez Catalina M.D.   On: 10/14/2020 22:17    ECG & Cardiac Imaging    Third degree AVB w/ V pacing @ 67 - personally reviewed.  Assessment & Plan    1.  Elevated HsTroponin:  Pt presented to the ED late on 6/17 w/ a 1 week h/o progressive, acute on chronic weakness and fatigue w/ leg weakness and fall on 6/16.  She denies any recent h/o chest pain or dyspnea.  She was found to have a UTI earlier this week and just started treatment for this on 6/16 (after fall).  In the setting of weakness, HsTrop was eval and noted to be elevated, initially @ 103 w/ subsequent values of 83  56.  ECG shows CHB w/ V pacing.  She denies chest pain or dyspnea.  Suspect demand ischemia in the setting of mild dehydration, recent chemo, ongoing XRT.  Will f/u echo (last 2020 - nl EF).  Can consider outpt stress testing if appropriate.  Hold home doses of amlodipine and  HCTZ given soft BP and weakness/dehydration.  Cont asa.  On chronic plavix in setting of prior stroke.  2.  Weakness/Fatigue:  acute on chronic worsening in the past week in the setting of ongoing chemoRx and XRT along w/ recent dx of UTI.  S/p mechanical fall on 6/16.  BUN/Creat elevated above baseline @ 53/1.05.  BPs somewhat soft.  Agree w/ holding amlodipine and HCTZ.  IVF per IM.  3.  UTI:  P mirabilis UTI dx 6/16.  Dysuria/polyuria over past week.  Abx per IM.Essential   4.  HTN:  holding amlodipine and HCTZ as noted above.  5.  CHB:  s/p PPM dating back to 2006 w/ upgrade in 2015.  Nl device fxn.  6.  Acute on chronic stage III kidney dzs:  diuretic on hold.  IVF.Hyperkalemia:  Hold home dose of KCl.  7.  Hyperkalemia:  Hold home dose of KCl.  8.  Hypomagnesemia:  Supplementation ordered.  9.  HL: on simv @ home.  10.  PAH:  RVSP of 48.65mHg on echo in 2020.  Appears euvolemic.  Echo in setting of #1.  148  H/o CVA:  on  plavix chronically.  12.  PAF:  Prev noted on device interrogations.  Not on [place] per Dr. Caryl Comes.  Signed, Murray Hodgkins, NP 10/15/2020, 12:26 PM  For questions or updates, please contact   Please consult [email or web address] for contact info under Cardiology/STEMI.

## 2020-10-15 NOTE — ED Notes (Signed)
Lab at bedside to collect blood

## 2020-10-15 NOTE — ED Provider Notes (Signed)
[facility] Emergency Department Provider Note  ____________________________________________  Time seen: Approximately 12:03 AM  I have reviewed the triage vital signs and the nursing notes.   HISTORY  Chief Complaint Weakness (Pt arrives to ED from home via EMS c/o generalized weakness x 1 week. Hx of lung ca, actively beng treated. Pt states she fell at home yesterday, abrasion noted on L knee. Pt denies pain at this time. A/O x 4.)    HPI [name] is a 87 y.o. female with a past history of GERD, hypertension, lung cancer, pacemaker who was brought to the ED due to generalized weakness for the past week.  Been compliant with medications.  Had chemo treatment 3 days ago, radiology treatment earlier today.  Patient denies pain vomiting or diarrhea.  No constipation or headaches.  She has had decreased appetite and oral intake.  She also had a urinalysis done 4 days ago through her oncology clinic.  This was positive for UTI with culture revealing Proteus that is pansensitive except for resistance to Macrobid.  She was started on Levaquin and has taken 2 doses so far.    Past Medical History:  Diagnosis Date   Breast cancer, right ([place]) 11/2018   Mastectomy   Complete heart block (HCC)    Dyspnea    Gait abnormality 07/23/2016   GERD (gastroesophageal reflux disease)    Hemoptysis 07/25/2020   History of hypertension    History of stroke    Hyperhomocysteinemia (HCC)    Hyperlipidemia    Hypertension    Hypokalemia 07/13/2018   Low blood magnesium 07/13/2018   Lung cancer ([place])    Pacemaker-Medtronic 08/07/2011   Stroke ([place])    left sided weakness, 07/28/20- no residual    Vomiting 07/13/2018     Patient Active Problem List   Diagnosis Date Noted   Squamous cell carcinoma of right lung ([place]) 08/05/2020   Hemoptysis 07/25/2020   Malignant neoplasm of upper-outer quadrant of right breast in female, estrogen receptor positive ([place]) 10/31/2018    Enteritis due to Clostridium difficile    Malnutrition of moderate degree 07/15/2018   Cough    Ileus (HCC)    Low blood magnesium 07/13/2018   Vomiting 07/13/2018   Loss of weight 06/27/2018   Rhabdomyolysis 05/26/2018   History of CVA (cerebrovascular accident) 05/26/2018   Hyperlipidemia 05/26/2018   AKI (acute kidney injury) ([place]) 05/26/2018   Transaminitis 05/26/2018   Elevated troponin 05/26/2018   Gait abnormality 07/23/2016   Essential hypertension 06/11/2016   Hypertensive urgency 06/11/2016   CAD (coronary artery disease) 06/11/2016   Pacemaker-Medtronic 08/07/2011   Atrial fibrillation ([place]) 08/07/2011   Daytime somnolence 08/07/2011   HYPERTENSION, HEART CONTROLLED W/O ASSOC CHF 06/07/2010   AV BLOCK, COMPLETE 06/07/2010   Cerebral artery occlusion with cerebral infarction ([place]) 06/07/2010     Past Surgical History:  Procedure Laterality Date   BIOPSY  07/15/2018   Procedure: BIOPSY;  Surgeon: [name], MD;  Location: [facility];  Service: Gastroenterology;;   BREAST BIOPSY Right 10/27/2018   affirm bx of mass, x clip,  INVASIVE MAMMARY CARCINOMA   BREAST LUMPECTOMY Right 12/03/2018   BRONCHIAL BIOPSY  07/29/2020   Procedure: BRONCHIAL BIOPSIES;  Surgeon: Laurin Coder, MD;  Location: [place];  Service: Pulmonary;;   BRONCHIAL NEEDLE ASPIRATION BIOPSY  07/29/2020   Procedure: BRONCHIAL NEEDLE ASPIRATION BIOPSIES;  Surgeon: Laurin Coder, MD;  Location: [facility];  Service: Pulmonary;;   CARDIAC CATHETERIZATION  ENDOBRONCHIAL ULTRASOUND N/A 07/29/2020   Procedure: ENDOBRONCHIAL ULTRASOUND;  Surgeon: Laurin Coder, MD;  Location: [place]OSCOPY;  Service: Pulmonary;  Laterality: N/A;   ESOPHAGOGASTRODUODENOSCOPY Left 07/15/2018   Procedure: ESOPHAGOGASTRODUODENOSCOPY (EGD);  Surgeon: [name], MD;  Location: [facility];  Service: Gastroenterology;  Laterality: Left;   HEMOSTASIS CONTROL  07/29/2020   Procedure: HEMOSTASIS  CONTROL;  Surgeon: Laurin Coder, MD;  Location: [facility];  Service: Pulmonary;;   PACEMAKER INSERTION     Medtronic Enpulse dual-chamber pacemaker   PARTIAL MASTECTOMY WITH NEEDLE LOCALIZATION AND AXILLARY SENTINEL LYMPH NODE BX Right 12/03/2018   Procedure: PARTIAL MASTECTOMY WITH NEEDLE LOCALIZATION AND AXILLARY SENTINEL LYMPH NODE BX, RIGHT;  Surgeon: [name], MD;  Location: [facility] ORS;  Service: General;  Laterality: Right;   PERMANENT PACEMAKER GENERATOR CHANGE N/A 07/30/2013   Procedure: PERMANENT PACEMAKER GENERATOR CHANGE;  Surgeon: [name], MD;  Location: [facility] CATH LAB;  Service: Cardiovascular;  Laterality: N/A;   PORTA CATH INSERTION N/A 08/16/2020   Procedure: PORTA CATH INSERTION;  Surgeon: Katha Cabal, MD;  Location: [facility] CV LAB;  Service: Cardiovascular;  Laterality: N/A;   VIDEO BRONCHOSCOPY N/A 07/29/2020   Procedure: VIDEO BRONCHOSCOPY WITH FLUORO;  Surgeon: Laurin Coder, MD;  Location: [facility] ENDOSCOPY;  Service: Pulmonary;  Laterality: N/A;     Prior to Admission medications   Medication Sig Start Date End Date Taking? Authorizing Provider  acetaminophen (TYLENOL) 500 MG tablet Take 500-1,000 mg by mouth every 6 (six) hours as needed for moderate pain.     [provider]  alendronate (FOSAMAX) 70 MG tablet TAKE 1 TABLET (70 MG TOTAL) BY MOUTH ONCE A WEEK. TAKE WITH A FULL GLASS OF WATER ON AN EMPTY STOMACH. 03/14/20   [name], MD  allopurinol (ZYLOPRIM) 100 MG tablet Take 100 mg by mouth daily. 11/28/19   [provider]  amLODipine (NORVASC) 5 MG tablet Take 5 mg by mouth daily.    [provider]  Blood Pressure KIT Automated blood pressure measuring device. 06/29/18   [name], MD  clopidogrel (PLAVIX) 75 MG tablet Take 75 mg by mouth daily.    [provider]  dexamethasone (DECADRON) 4 MG tablet Take 1 tablet (4 mg total) by mouth daily. 09/27/20   [name], MD   hydrochlorothiazide (HYDRODIURIL) 25 MG tablet Take 25 mg by mouth daily. 10/19/19   [provider]  letrozole ([place]) 2.5 MG tablet TAKE 1 TABLET BY MOUTH EVERY DAY Patient taking differently: Take 2.5 mg by mouth daily. 09/30/19   [name], MD  levofloxacin (LEVAQUIN) 500 MG tablet Take 1 tablet (500 mg total) by mouth daily. 10/13/20   Jacquelin Hawking, NP  lidocaine-prilocaine (EMLA) cream Apply to affected area once 08/17/20   [name], MD  LUMIGAN 0.01 % SOLN Place 1 drop into both eyes at bedtime. 01/13/18   [provider]  ondansetron (ZOFRAN) 8 MG tablet Take 1 tablet (8 mg total) by mouth 2 (two) times daily as needed for refractory nausea / vomiting. 08/17/20   [name], MD  pantoprazole (PROTONIX) 40 MG tablet Take 40 mg by mouth daily.    [provider]  potassium chloride SA (KLOR-CON) 20 MEQ tablet Take 1 tablet (20 mEq total) by mouth 2 (two) times daily. 09/27/20   [name], MD  prochlorperazine (COMPAZINE) 10 MG tablet Take 1 tablet (10 mg total) by mouth every 6 (six) hours as needed (Nausea  or vomiting). 08/17/20   [name], MD  simvastatin (ZOCOR) 40 MG tablet Take 1 tablet (40 mg total) by mouth daily at 6 PM. 06/28/20   [name], MD     Allergies Patient has no known allergies.   Family History  Problem Relation Age of Onset   Hypertension Mother    Breast cancer Sister    Breast cancer Sister    Colon cancer Neg Hx    Esophageal cancer Neg Hx     Social History Social History   Tobacco Use   Smoking status: Former    Pack years: 0.00    Types: Cigarettes    Quit date: 07/25/2006    Years since quitting: 14.2   Smokeless tobacco: Never  Vaping Use   Vaping Use: Never used  Substance Use Topics   Alcohol use: No   Drug use: No    Review of Systems  Constitutional:   No fever or chills.  Positive fatigue ENT:   No sore throat. No rhinorrhea. Cardiovascular:   No  chest pain or syncope. Respiratory:   No dyspnea or cough. Gastrointestinal:   Negative for abdominal pain, vomiting and diarrhea.  Musculoskeletal:   Negative for focal pain or swelling All other systems reviewed and are negative except as documented above in ROS and HPI.  ____________________________________________   PHYSICAL EXAM:  VITAL SIGNS: ED Triage Vitals  Enc Vitals Group     BP 10/14/20 2137 109/75     Pulse Rate 10/14/20 2137 68     Resp 10/14/20 2137 (!) 22     Temp --      Temp src --      SpO2 10/14/20 2136 99 %     Weight --      Height --      Head Circumference --      Peak Flow --      Pain Score 10/14/20 2139 0     Pain Loc --      Pain Edu? --      Excl. in [place]? --     Vital signs reviewed, nursing assessments reviewed.   Constitutional:   Alert and oriented. Non-toxic appearance. Eyes:   Conjunctivae are normal. EOMI. PERRL. ENT      Head:   Normocephalic and atraumatic.      Nose:   Normal      Mouth/Throat:   Dry mucous membranes.      Neck:   No meningismus. Full ROM. Hematological/Lymphatic/Immunilogical:   No cervical lymphadenopathy. Cardiovascular:   RRR. Symmetric bilateral radial and DP pulses.  No murmurs. Cap refill less than 2 seconds. Respiratory:   Normal respiratory effort without tachypnea/retractions. Breath sounds are clear and equal bilaterally. No wheezes/rales/rhonchi. Gastrointestinal:   Soft and nontender. Non distended. There is no CVA tenderness.  No rebound, rigidity, or guarding.  Musculoskeletal:   Normal range of motion in all extremities. No joint effusions.  No lower extremity tenderness.  No edema. Neurologic:   Normal speech and language.  Motor grossly intact. No acute focal neurologic deficits are appreciated.  Skin:    Skin is warm, dry with abrasion over left prepatellar knee. No rash noted.  No petechiae, purpura, or bullae.  ____________________________________________    LABS (pertinent  positives/negatives) (all labs ordered are listed, but only abnormal results are displayed) Labs Reviewed  COMPREHENSIVE METABOLIC PANEL - Abnormal; Notable for the following components:      Result Value   Sodium 130 (*)  Potassium 5.9 (*)    CO2 21 (*)    Glucose, Bld 141 (*)    BUN 53 (*)    Creatinine, Ser 1.05 (*)    Calcium 10.4 (*)    Total Protein 8.4 (*)    GFR, Estimated 52 (*)    All other components within normal limits  CBC WITH DIFFERENTIAL/PLATELET - Abnormal; Notable for the following components:   WBC 3.8 (*)    RDW 18.7 (*)    Platelets 128 (*)    Lymphs Abs 0.1 (*)    Monocytes Absolute 0.0 (*)    All other components within normal limits  TROPONIN I (HIGH SENSITIVITY) - Abnormal; Notable for the following components:   Troponin I (High Sensitivity) 103 (*)    All other components within normal limits  RESP PANEL BY RT-PCR (FLU A&B, COVID) ARPGX2  LIPASE, BLOOD  TROPONIN I (HIGH SENSITIVITY)   ____________________________________________   EKG  Interpreted by me Complete heart block, rate of 67.normal axis. LBBB. PRWP. No acute ischemic changes.  Unchanged from previous EKG.  ____________________________________________    [id number]  DG Chest Portable 1 View  Result Date: 10/14/2020 CLINICAL DATA:  Recent fall with weakness, initial encounter EXAM: PORTABLE CHEST 1 VIEW COMPARISON:  07/24/2020, PET-CT from 08/15/2020 FINDINGS: Right-sided chest wall port is now seen in satisfactory position. Cardiac shadow is stable. Pacing device is again noted. Improved aeration in the right base is noted. Nodular density is noted in the right base similar to that seen on prior PET-CT. IMPRESSION: Nodular density in the right lung base similar to that seen on prior PET-CT. The large central area of mass and consolidation has resolved on the right. Electronically Signed   By: Inez Catalina M.D.   On: 10/14/2020 22:17     ____________________________________________   PROCEDURES Procedures  ____________________________________________  DIFFERENTIAL DIAGNOSIS   Dehydration, electrolyte abnormality, viral illness, pneumonia  CLINICAL IMPRESSION / ASSESSMENT AND PLAN / ED COURSE  Medications ordered in the ED: Medications  lactated ringers bolus 1,000 mL (1,000 mLs Intravenous New Bag/Given 10/14/20 2231)  Tdap (BOOSTRIX) injection 0.5 mL (0.5 mLs Intramuscular Given 10/14/20 2234)  ondansetron (ZOFRAN) injection 4 mg (4 mg Intravenous Given 10/14/20 2233)    Pertinent labs & imaging results that were available during my care of the patient were reviewed by me and considered in my medical decision making (see chart for details).  [name] was evaluated in Emergency Department on 10/15/2020 for the symptoms described in the history of present illness. She was evaluated in the context of the global COVID-19 pandemic, which necessitated consideration that the patient might be at risk for infection with the SARS-CoV-2 virus that causes COVID-19. Institutional protocols and algorithms that pertain to the evaluation of patients at risk for COVID-19 are in a state of rapid change based on information released by regulatory bodies including the CDC and federal and state organizations. These policies and algorithms were followed during the patient's care in the ED.   Patient brought to ED due to fatigue/general weakness for the last week.  She was recently diagnosed with UTI and started on Levaquin by her doctor.  Vital signs unremarkable.  EKG shows chronic complete heart block, but no pacer spikes seen.  Reviewing EMR, pacemaker generator was changed in 2015.  Lab showed chronic leukopenia.  Chemistries okay, consistent with some mild dehydration.  COVID and flu negative.  Troponin slightly elevated at 100 which I think is due to her recent chemo and  radiation therapy.  Chest x-ray unremarkable.  Tetanus  updated IV fluids given for hydration Will trend troponin, ensure that it is not significantly rising Will interrogate pacemaker to ensure proper function  Plan of care discussed with patient and daughter at bedside     ____________________________________________   FINAL CLINICAL IMPRESSION(S) / ED DIAGNOSES    Final diagnoses:  Weakness  Dehydration  Malignant neoplasm of left lung, unspecified part of lung [facility])     ED Discharge Orders     None       Portions of this note were generated with dragon dictation software. Dictation errors may occur despite best attempts at proofreading.   Carrie Mew, MD 10/15/20 0020

## 2020-10-15 NOTE — ED Notes (Signed)
Pt ate 50% lunch. Lab bedside attempting to get repeat lactic and trop

## 2020-10-15 NOTE — ED Notes (Signed)
This RN unable to obtain lab work at this time. Lab called to collect blood specimens

## 2020-10-15 NOTE — ED Notes (Signed)
Pt readjusted in bed, repositioned purewick, new brief placed, no other needs at this time

## 2020-10-15 NOTE — ED Notes (Signed)
Pt given meal tray.

## 2020-10-15 NOTE — H&P (Addendum)
History and Physical    [name]:[id number] DOB: 07/22/1933 DOA: 10/14/2020  Referring MD/NP/PA:   PCP: Philmore Pali, NP   Patient coming from:  The patient is coming from home.  At baseline, pt is independent for most of ADL.        Chief Complaint: weakness, dysuria, fall  HPI: [name] is a 87 y.o. female with medical history significant of s/p for pacemaker placement due to complete heart block, HTN, HLD, stroke, GERD, gout, A fib not on AC, CAD, c diff colitis, rhabdomyolysis, CKD-IIIa, R breast cancer (s/p for mastectomy 2020), squamous cell lung cancer on chemo and radiation therapy, UTI, who presents with weakness, dysuria, fall  Patient has generalized weakness for about 1 week, which has been progressively worsening.  She has poor appetite and decreased oral intake.  Patient had positive urinalysis for UTI in clinic and positive urine culture for Proteus on 6/14. Pt was started on Levaquin on 6/16.  She states she still has dysuria, burning on urination and increased urinary frequency, but no hematuria.  Denies nausea, vomiting, diarrhea or abdominal pain.  Patient states that she has mild dry cough, but no chest pain, shortness of breath.  No fever or chills. Patient fell 2 days ago, no LOC.  She has left knee skin abrasion, but no knee joint pain.  No unilateral numbness or tingling to extremities, no facial droop or slurred speech. Of note, pt has lung cancer and currently is on radiation and chemotherapy.  Last chemo was chemo treatment 4 days ago, and last radiology treatment was yesterday.  ED Course: pt was found to have WBC 3.8, troponin level  103, 87, negative COVID PCR, slightly worsening renal function, potassium 5.8, temperature normal, blood pressure 119/77, heart rate 49-60s, RR 23, 21, oxygen saturation 97% on room air.  Patient is admitted to progressive bed as inpatient.  Pacemaker was interrogated in ED. Dr. Rockey Situ for cardiology is  consulted.  CXR showed: Nodular density in the right lung base similar to that seen on prior PET-CT. The large central area of mass and consolidation has resolved on the right.  Review of Systems:   General: no fevers, chills, no body weight gain, has poor appetite, has fatigue HEENT: no blurry vision, hearing changes or sore throat Respiratory: no dyspnea, has coughing, no wheezing CV: no chest pain, no palpitations GI: no nausea, vomiting, abdominal pain, diarrhea, constipation GU: has dysuria, burning on urination, increased urinary frequency, no hematuria  Ext: no leg edema Neuro: no unilateral weakness, numbness, or tingling, no vision change or hearing loss. Has fall Skin: no rash. Has 2 small area of skin abrasion left knee MSK: No muscle spasm, no deformity, no limitation of range of movement in spin Heme: No easy bruising.  Travel history: No recent long distant travel.  Allergy: No Known Allergies  Past Medical History:  Diagnosis Date   Breast cancer, right ([place]) 11/2018   Mastectomy   Complete heart block (HCC)    Dyspnea    Gait abnormality 07/23/2016   GERD (gastroesophageal reflux disease)    Hemoptysis 07/25/2020   History of hypertension    History of stroke    Hyperhomocysteinemia (HCC)    Hyperlipidemia    Hypertension    Hypokalemia 07/13/2018   Low blood magnesium 07/13/2018   Lung cancer ([place])    Pacemaker-Medtronic 08/07/2011   Stroke ([place])    left sided weakness, 07/28/20- no residual    Vomiting 07/13/2018  Past Surgical History:  Procedure Laterality Date   BIOPSY  07/15/2018   Procedure: BIOPSY;  Surgeon: [name], MD;  Location: [facility];  Service: Gastroenterology;;   BREAST BIOPSY Right 10/27/2018   affirm bx of mass, x clip,  INVASIVE MAMMARY CARCINOMA   BREAST LUMPECTOMY Right 12/03/2018   BRONCHIAL BIOPSY  07/29/2020   Procedure: BRONCHIAL BIOPSIES;  Surgeon: Laurin Coder, MD;  Location: [place];  Service:  Pulmonary;;   BRONCHIAL NEEDLE ASPIRATION BIOPSY  07/29/2020   Procedure: BRONCHIAL NEEDLE ASPIRATION BIOPSIES;  Surgeon: Laurin Coder, MD;  Location: [place];  Service: Pulmonary;;   CARDIAC CATHETERIZATION     ENDOBRONCHIAL ULTRASOUND N/A 07/29/2020   Procedure: ENDOBRONCHIAL ULTRASOUND;  Surgeon: Laurin Coder, MD;  Location: [place];  Service: Pulmonary;  Laterality: N/A;   ESOPHAGOGASTRODUODENOSCOPY Left 07/15/2018   Procedure: ESOPHAGOGASTRODUODENOSCOPY (EGD);  Surgeon: [name], MD;  Location: [facility];  Service: Gastroenterology;  Laterality: Left;   HEMOSTASIS CONTROL  07/29/2020   Procedure: HEMOSTASIS CONTROL;  Surgeon: Laurin Coder, MD;  Location: [facility];  Service: Pulmonary;;   PACEMAKER INSERTION     Medtronic Enpulse dual-chamber pacemaker   PARTIAL MASTECTOMY WITH NEEDLE LOCALIZATION AND AXILLARY SENTINEL LYMPH NODE BX Right 12/03/2018   Procedure: PARTIAL MASTECTOMY WITH NEEDLE LOCALIZATION AND AXILLARY SENTINEL LYMPH NODE BX, RIGHT;  Surgeon: [name], MD;  Location: [facility];  Service: General;  Laterality: Right;   PERMANENT PACEMAKER GENERATOR CHANGE N/A 07/30/2013   Procedure: PERMANENT PACEMAKER GENERATOR CHANGE;  Surgeon: [name], MD;  Location[facility] LAB;  Service: Cardiovascular;  Laterality: N/A;   PORTA CATH INSERTION N/A 08/16/2020   Procedure: PORTA CATH INSERTION;  Surgeon: Katha Cabal, MD;  Location: [facility] LAB;  Service: Cardiovascular;  Laterality: N/A;   VIDEO BRONCHOSCOPY N/A 07/29/2020   Procedure: VIDEO BRONCHOSCOPY WITH FLUORO;  Surgeon: Laurin Coder, MD;  Location: [facility];  Service: Pulmonary;  Laterality: N/A;    Social History:  reports that she quit smoking about 14 years ago. She has never used smokeless tobacco. She reports that she does not drink alcohol and does not use drugs.  Family History:  Family History  Problem Relation Age of Onset   Hypertension Mother     Breast cancer Sister    Breast cancer Sister    Colon cancer Neg Hx    Esophageal cancer Neg Hx      Prior to Admission medications   Medication Sig Start Date End Date Taking? Authorizing Provider  alendronate (FOSAMAX) 70 MG tablet TAKE 1 TABLET (70 MG TOTAL) BY MOUTH ONCE A WEEK. TAKE WITH A FULL GLASS OF WATER ON AN EMPTY STOMACH. 03/14/20  Yes [name], MD  allopurinol (ZYLOPRIM) 100 MG tablet Take 100 mg by mouth daily. 11/28/19  Yes [provider]  amLODipine (NORVASC) 5 MG tablet Take 5 mg by mouth daily.   Yes [provider]  clopidogrel (PLAVIX) 75 MG tablet Take 75 mg by mouth daily.   Yes [provider]  dexamethasone (DECADRON) 4 MG tablet Take 1 tablet (4 mg total) by mouth daily. 09/27/20  Yes [name], MD  hydrochlorothiazide (HYDRODIURIL) 25 MG tablet Take 25 mg by mouth daily. 10/19/19  Yes [provider]  letrozole ([place]) 2.5 MG tablet TAKE 1 TABLET BY MOUTH EVERY DAY Patient taking differently: Take 2.5 mg by mouth daily. 09/30/19  Yes [name], MD  levofloxacin (LEVAQUIN) 500 MG tablet Take  1 tablet (500 mg total) by mouth daily. 10/13/20  Yes Burns, Wandra Feinstein, NP  pantoprazole (PROTONIX) 40 MG tablet Take 40 mg by mouth daily.   Yes [provider]  potassium chloride SA (KLOR-CON) 20 MEQ tablet Take 1 tablet (20 mEq total) by mouth 2 (two) times daily. 09/27/20  Yes [name], MD  acetaminophen (TYLENOL) 500 MG tablet Take 500-1,000 mg by mouth every 6 (six) hours as needed for moderate pain.     [provider]  Blood Pressure KIT Automated blood pressure measuring device. 06/29/18   [name], MD  lidocaine-prilocaine (EMLA) cream Apply to affected area once 08/17/20   [name], MD  LUMIGAN 0.01 % SOLN Place 1 drop into both eyes at bedtime. 01/13/18   [provider]  ondansetron (ZOFRAN) 8 MG tablet Take 1 tablet (8 mg total) by mouth 2 (two) times  daily as needed for refractory nausea / vomiting. 08/17/20   [name], MD  prochlorperazine (COMPAZINE) 10 MG tablet Take 1 tablet (10 mg total) by mouth every 6 (six) hours as needed (Nausea or vomiting). 08/17/20   [name], MD  simvastatin (ZOCOR) 40 MG tablet Take 1 tablet (40 mg total) by mouth daily at 6 PM. 06/28/20   [name], MD    Physical Exam: Vitals:   10/15/20 0630 10/15/20 0700 10/15/20 0715 10/15/20 0730  BP: 102/68 101/72  120/69  Pulse: 60 63 (!) 58 61  Resp: 16 19 14 14   Temp:      TempSrc:      SpO2: 97% 98% 99% 99%   General: Not in acute distress HEENT:       Eyes: PERRL, EOMI, no scleral icterus.       ENT: No discharge from the ears and nose, no pharynx injection, no tonsillar enlargement.        Neck: No JVD, no bruit, no mass felt. Heme: No neck lymph node enlargement. Cardiac: S1/S2, irregular rhythm, No murmurs, No gallops or rubs. Respiratory: No rales, wheezing, rhonchi or rubs. GI: Soft, nondistended, nontender, no rebound pain, no organomegaly, BS present. GU: No hematuria Ext: No pitting leg edema bilaterally. 1+DP/PT pulse bilaterally. Musculoskeletal: No joint deformities, No joint redness or warmth, no limitation of ROM in spin. Skin: No rashes. Has 2 small area of skin abrasion left knee Neuro: Alert, oriented X3, cranial nerves II-XII grossly intact, moves all extremities normally.  Psych: Patient is not psychotic, no suicidal or hemocidal ideation.  Labs on Admission: I have personally reviewed following labs and imaging studies  CBC: Recent Labs  Lab 10/11/20 0901 10/14/20 2148  WBC 3.7* 3.8*  NEUTROABS 2.8 3.6  HGB 12.1 13.3  HCT 38.5 42.0  MCV 83.7 82.7  PLT 157 149*   Basic Metabolic Panel: Recent Labs  Lab 10/11/20 0901 10/14/20 2148 10/15/20 0233  NA 134* 130*  --   K 4.8 5.9* 5.8*  CL 99 99  --   CO2 25 21*  --   GLUCOSE 100* 141*  --   BUN 33* 53*  --   CREATININE 0.91 1.05*  --   CALCIUM  10.1 10.4*  --    GFR: Estimated Creatinine Clearance: 31 mL/min (A) (by C-G formula based on SCr of 1.05 mg/dL (H)). Liver Function Tests: Recent Labs  Lab 10/11/20 0901 10/14/20 2148  AST 18 24  ALT 18 24  ALKPHOS 47 44  BILITOT 0.7 1.1  PROT 8.0 8.4*  ALBUMIN 3.5  3.8   Recent Labs  Lab 10/14/20 2148  LIPASE 32   No results for input(s): AMMONIA in the last 168 hours. Coagulation Profile: No results for input(s): INR, PROTIME in the last 168 hours. Cardiac Enzymes: No results for input(s): CKTOTAL, CKMB, CKMBINDEX, TROPONINI in the last 168 hours. BNP (last 3 results) No results for input(s): PROBNP in the last 8760 hours. HbA1C: No results for input(s): HGBA1C in the last 72 hours. CBG: No results for input(s): GLUCAP in the last 168 hours. Lipid Profile: No results for input(s): CHOL, HDL, LDLCALC, TRIG, CHOLHDL, LDLDIRECT in the last 72 hours. Thyroid Function Tests: No results for input(s): TSH, T4TOTAL, FREET4, T3FREE, THYROIDAB in the last 72 hours. Anemia Panel: No results for input(s): VITAMINB12, FOLATE, FERRITIN, TIBC, IRON, RETICCTPCT in the last 72 hours. Urine analysis:    Component Value Date/Time   COLORURINE YELLOW (A) 10/11/2020 1329   APPEARANCEUR HAZY (A) 10/11/2020 1329   LABSPEC 1.015 10/11/2020 1329   PHURINE 7.0 10/11/2020 1329   GLUCOSEU NEGATIVE 10/11/2020 1329   HGBUR SMALL (A) 10/11/2020 1329   BILIRUBINUR NEGATIVE 10/11/2020 1329   KETONESUR NEGATIVE 10/11/2020 1329   PROTEINUR 100 (A) 10/11/2020 1329   NITRITE NEGATIVE 10/11/2020 1329   LEUKOCYTESUR LARGE (A) 10/11/2020 1329   Sepsis Labs: @LABRCNTIP (procalcitonin:4,lacticidven:4) ) Recent Results (from the past 240 hour(s))  Urine Culture     Status: Abnormal   Collection Time: 10/11/20  1:29 PM   Specimen: Urine, Random  Result Value Ref Range Status   Specimen Description   Final    URINE, RANDOM Performed at [facility], [address] [place], [place]  [address]    Special Requests   Final    NONE Performed at [facility], [address] [place], [place] [address]    Culture >=100,000 COLONIES/mL PROTEUS MIRABILIS (A)  Final   Report Status 10/14/2020 FINAL  Final   Organism ID, Bacteria PROTEUS MIRABILIS (A)  Final      Susceptibility   Proteus mirabilis - MIC*    AMPICILLIN <=2 SENSITIVE Sensitive     CEFAZOLIN <=4 SENSITIVE Sensitive     CEFEPIME <=0.12 SENSITIVE Sensitive     CEFTRIAXONE <=0.25 SENSITIVE Sensitive     CIPROFLOXACIN <=0.25 SENSITIVE Sensitive     GENTAMICIN <=1 SENSITIVE Sensitive     IMIPENEM 2 SENSITIVE Sensitive     NITROFURANTOIN 128 RESISTANT Resistant     TRIMETH/SULFA <=20 SENSITIVE Sensitive     AMPICILLIN/SULBACTAM <=2 SENSITIVE Sensitive     PIP/TAZO <=4 SENSITIVE Sensitive     * >=100,000 COLONIES/mL PROTEUS MIRABILIS  Resp Panel by RT-PCR (Flu A&B, Covid) Nasopharyngeal Swab     Status: None   Collection Time: 10/14/20  9:48 PM   Specimen: Nasopharyngeal Swab; Nasopharyngeal(NP) swabs in vial transport medium  Result Value Ref Range Status   SARS Coronavirus 2 by RT PCR NEGATIVE NEGATIVE Final    Comment: (NOTE) SARS-CoV-2 target nucleic acids are NOT DETECTED.  The SARS-CoV-2 RNA is generally detectable in upper respiratory specimens during the acute phase of infection. The lowest concentration of SARS-CoV-2 viral copies this assay can detect is 138 copies/mL. A negative result does not preclude SARS-Cov-2 infection and should not be used as the sole basis for treatment or other patient management decisions. A negative result may occur with  improper specimen collection/handling, submission of specimen other than nasopharyngeal swab, presence of viral mutation(s) within the areas targeted by this assay, and inadequate number of viral copies(<138 copies/mL). A  negative result must be combined with clinical observations, patient history, and epidemiological information. The expected  result is Negative.  Fact Sheet for Patients:  EntrepreneurPulse.com.au  Fact Sheet for Healthcare Providers:  IncredibleEmployment.be  This test is no t yet approved or cleared by the [place] FDA and  has been authorized for detection and/or diagnosis of SARS-CoV-2 by FDA under an Emergency Use Authorization (EUA). This EUA will remain  in effect (meaning this test can be used) for the duration of the COVID-19 declaration under Section 564(b)(1) of the Act, 21 U.S.C.section 360bbb-3(b)(1), unless the authorization is terminated  or revoked sooner.       Influenza A by PCR NEGATIVE NEGATIVE Final   Influenza B by PCR NEGATIVE NEGATIVE Final    Comment: (NOTE) The Xpert Xpress SARS-CoV-2/FLU/RSV plus assay is intended as an aid in the diagnosis of influenza from Nasopharyngeal swab specimens and should not be used as a sole basis for treatment. Nasal washings and aspirates are unacceptable for Xpert Xpress SARS-CoV-2/FLU/RSV testing.  Fact Sheet for Patients: EntrepreneurPulse.com.au  Fact Sheet for Healthcare Providers: IncredibleEmployment.be  This test is not yet approved or cleared by the [place] FDA and has been authorized for detection and/or diagnosis of SARS-CoV-2 by FDA under an Emergency Use Authorization (EUA). This EUA will remain in effect (meaning this test can be used) for the duration of the COVID-19 declaration under Section 564(b)(1) of the Act, 21 U.S.C. section 360bbb-3(b)(1), unless the authorization is terminated or revoked.  Performed at [facility], [address] [place], [place] [address]      Radiological Exams on Admission: DG Chest Portable 1 View  Result Date: 10/14/2020 CLINICAL DATA:  Recent fall with weakness, initial encounter EXAM: PORTABLE CHEST 1 VIEW COMPARISON:  07/24/2020, PET-CT from 08/15/2020 FINDINGS: Right-sided chest wall port is  now seen in satisfactory position. Cardiac shadow is stable. Pacing device is again noted. Improved aeration in the right base is noted. Nodular density is noted in the right base similar to that seen on prior PET-CT. IMPRESSION: Nodular density in the right lung base similar to that seen on prior PET-CT. The large central area of mass and consolidation has resolved on the right. Electronically Signed   By: Inez Catalina M.D.   On: 10/14/2020 22:17     EKG: I have personally reviewed.  First EKG showed third-degree AV block, low voltage, poor R wave progression, ST elevation in V1-V2.  Second EKG showed third-degree AV block, QTC 524, ST elevation in V1-V4.   Assessment/Plan Principal Problem:   UTI (urinary tract infection) Active Problems:   AV BLOCK, COMPLETE   Atrial fibrillation (HCC)   History of CVA (cerebrovascular accident)   Essential hypertension   Hyperlipidemia   Elevated troponin   Malignant neoplasm of upper-outer quadrant of right breast in female, estrogen receptor positive ([place])   Squamous cell carcinoma of right lung (HCC)   CAD (coronary artery disease)   Weakness   Fall at home, initial encounter   CKD (chronic kidney disease), stage IIIa   Hyperkalemia   Hypercalcemia   Sepsis ([place])   Severe sepsis due to UTI (urinary tract infection): Patient had positive urine culture with Proteus on 6/14.  She was started with Levaquin 6/16, but still has typical symptoms for UTI.  Patient meets criteria for sepsis with WBC 3.8 (<4.0) and tachypnea with RR 23.  Lactic acid 2.7. Currently hemodynamically stable.  Pending lactic acid level.  -Admitted to progressive bed as inpatient due  to elevated troponin -IV Rocephin in ED-->  will change to cefazolin (patient had positive urine culture with Proteus, only resistant to nitrofurantoin) -will get Procalcitonin and trend lactic acid levels per sepsis protocol. -IVF: 1.5L of LR bolus in ED, followed by 75 cc/h    AV BLOCK,  COMPLETE -s/p of pacemaker placement which is interrogated in ED and it seems to be functioning normally  Atrial fibrillation [facility]): Patient not taking anticoagulants. -Cardiac monitor  History of CVA (cerebrovascular accident) -Plavix and Zocor  Essential hypertension -Hold HCTZ due to worsening renal function -Continue amlodipine -As needed IV hydralazine  Hyperlipidemia -Zocor  Elevated troponin and CAD: Troponin level 103 --> 87.  No chest pain.  Possibly due to demand ischemia, but EKG seem to have ST elevation. -Trend troponin -324 mg aspirin was given in ED -Continue Zocor -Check A1c, FLP -Consulted Dr. Rockey Situ for cardiology  Malignant neoplasm of upper-outer quadrant of right breast in female, estrogen receptor positive ([place]): S/p of mastectomy. -Continue letrozole  Squamous cell carcinoma of right lung [facility]): On chemo and radiotherapy currently -Following up with Dr. Grayland Ormond of oncology  Weakness and fall at home, initial encounter: -PT/OT -f/u CT-head  CKD (chronic kidney disease), stage IIIa: Baseline creatinine 0.91 on 6/14.  Her creatinine is 1.05, BUN 53, slightly worsening.  Likely due to dehydration and continuation of her HCTZ. -Hold HCTZ -IV fluid as above  Hyperkalemia: K 5.8 -10 g of Lokelma -check Mg level --> Mg 1.6 --> give 2 g magnesium sulfate  Hypercalcemia: mild. Ca 10.4, likely due to dehydration -IV fluid as above        DVT ppx: SQ Lovenox Code Status: Full code per pt and her granddaughter Family Communication: Yes, patient's granddaughter by phone Disposition Plan:  Anticipate discharge back to previous environment Consults called:  Dr. Rockey Situ of card Admission status and Level of care: Progressive Cardiac:   as inpt       Status is: Inpatient  Remains inpatient appropriate because:Inpatient level of care appropriate due to severity of illness  Dispo: The patient is from: Home              Anticipated d/c is to: Home               Patient currently is not medically stable to d/c.   Difficult to place patient No          Date of Service 10/15/2020    [facility]   If 7PM-7AM, please contact night-coverage [email or web address] 10/15/2020, 8:28 AM

## 2020-10-15 NOTE — Progress Notes (Signed)
OT Cancellation Note  Patient Details Name: JODIANN OGNIBENE MRN: [id number] DOB: [birth year]   Cancelled Treatment:    Reason Eval/Treat Not Completed: Other (comment) Pt with significantly elevated K+ this date at 5.8. Will f/u for OT evaluation as pt's electrolytes become more appropriate as she is also undergoing cardiac workup. Thank you.  Gerrianne Scale, [place], OTR/L ascom [phone] 10/15/20, 2:39 PM

## 2020-10-15 NOTE — ED Notes (Signed)
Patient denies pain at this time.

## 2020-10-16 ENCOUNTER — Encounter: Payer: Self-pay | Admitting: Internal Medicine

## 2020-10-16 DIAGNOSIS — I480 Paroxysmal atrial fibrillation: Secondary | ICD-10-CM

## 2020-10-16 DIAGNOSIS — N390 Urinary tract infection, site not specified: Secondary | ICD-10-CM | POA: Diagnosis not present

## 2020-10-16 DIAGNOSIS — R531 Weakness: Secondary | ICD-10-CM | POA: Diagnosis not present

## 2020-10-16 DIAGNOSIS — R778 Other specified abnormalities of plasma proteins: Secondary | ICD-10-CM | POA: Diagnosis not present

## 2020-10-16 LAB — CBC
HCT: 33 % — ABNORMAL LOW (ref 36.0–46.0)
Hemoglobin: 10.5 g/dL — ABNORMAL LOW (ref 12.0–15.0)
MCH: 26.6 pg (ref 26.0–34.0)
MCHC: 31.8 g/dL (ref 30.0–36.0)
MCV: 83.8 fL (ref 80.0–100.0)
Platelets: 94 10*3/uL — ABNORMAL LOW (ref 150–400)
RBC: 3.94 MIL/uL (ref 3.87–5.11)
RDW: 18.1 % — ABNORMAL HIGH (ref 11.5–15.5)
WBC: 2 10*3/uL — ABNORMAL LOW (ref 4.0–10.5)
nRBC: 0 % (ref 0.0–0.2)

## 2020-10-16 LAB — BASIC METABOLIC PANEL
Anion gap: 6 (ref 5–15)
BUN: 25 mg/dL — ABNORMAL HIGH (ref 8–23)
CO2: 25 mmol/L (ref 22–32)
Calcium: 8.9 mg/dL (ref 8.9–10.3)
Chloride: 104 mmol/L (ref 98–111)
Creatinine, Ser: 0.72 mg/dL (ref 0.44–1.00)
GFR, Estimated: >60 mL/min (ref >60)
Glucose, Bld: 89 mg/dL (ref 70–99)
Potassium: 4.1 mmol/L (ref 3.5–5.1)
Sodium: 135 mmol/L (ref 135–145)

## 2020-10-16 LAB — LIPID PANEL
Cholesterol: 111 mg/dL (ref 0–200)
HDL: 28 mg/dL — ABNORMAL LOW (ref >40)
LDL Cholesterol: 52 mg/dL (ref 0–99)
Total CHOL/HDL Ratio: 4 RATIO
Triglycerides: 155 mg/dL — ABNORMAL HIGH (ref <150)
VLDL: 31 mg/dL (ref 0–40)

## 2020-10-16 LAB — MAGNESIUM: Magnesium: 1.9 mg/dL (ref 1.7–2.4)

## 2020-10-16 MED ORDER — DM-GUAIFENESIN ER 30-600 MG PO TB12
1.0000 | ORAL_TABLET | Freq: Two times a day (BID) | ORAL | Status: DC
  Administered 2020-10-16 – 2020-10-22 (×13): 1 via ORAL
  Filled 2020-10-16 (×11): qty 1

## 2020-10-16 MED ORDER — DILTIAZEM HCL ER COATED BEADS 120 MG PO CP24
120.0000 mg | ORAL_CAPSULE | Freq: Every day | ORAL | Status: DC
  Administered 2020-10-17 – 2020-10-22 (×6): 120 mg via ORAL
  Filled 2020-10-16 (×6): qty 1

## 2020-10-16 NOTE — Evaluation (Signed)
Physical Therapy Evaluation Patient Details Name: [name] MRN: [id number] DOB: 18-Sep-1933 Today's Date: 10/16/2020   History of Present Illness  Pt is a 87 y.o. female with medical history significant of s/p for pacemaker placement due to complete heart block, HTN, HLD, stroke, GERD, gout, A fib not on AC, CAD, c diff colitis, rhabdomyolysis, CKD-IIIa, R breast cancer (s/p for mastectomy 2020), squamous cell lung cancer on chemo and radiation therapy, UTI, who presents to ED on 10/14/20 with weakness, dysuria and fall. Pt with positive UTI on 6/14, started Levaquin on 6/16.  Clinical Impression  Patient received by PT supine in bed and eager to get up into the recliner. Pt able to complete bed mobility with minA, Vcs for appropriate hand placement and sequencing. Pt requiring BIL UE and LE support to maintain seated EOB safely. Pt voiced increased anxiety with ambulation, but willing to participate. Completed ambulation walking EOB using RW for 52f then requested a seated rest break. Completed two more rounds of ambulation at EOB for 8 ft each with Vcs provided to pt for PLB, posture and minA for RW management. Pt verbalized increased fatigue and anxiety about ambulating. Pt seated in recliner with chair alarm set, call bell in reach and all needs met at this time. Recommend SNF upon discharge to address patient's ongoing balance deficits, LE strength and to decrease fall risk.     Follow Up Recommendations SNF (Due to pt fall risk and increased fear of falling recommend rehab at this time for safety)    Equipment Recommendations  None recommended by PT    Recommendations for Other Services       Precautions / Restrictions Precautions Precautions: Fall Restrictions Weight Bearing Restrictions: No      Mobility  Bed Mobility Overal bed mobility: Needs Assistance Bed Mobility: Supine to Sit     Supine to sit: Min assist     General bed mobility comments: Pt requiring cueing  for handplacement and minA to come to the upright seated position    Transfers Overall transfer level: Needs assistance Equipment used: Standard walker Transfers: Sit to/from Stand Sit to Stand: Min guard;Min assist         General transfer comment: Pt requiring CGA to transfer from elevated surface, but minA to trasnfer sit<>stand from recliner height.  Ambulation/Gait Ambulation/Gait assistance: Min assist Gait Distance (Feet): 20 Feet (pt able to ambulate a total of 20 ft, 3 rounds 47f [id number] device: Standard walker Gait Pattern/deviations: Step-to pattern;Shuffle     General Gait Details: Pt with slow shuffling gait pattern, required minA for RW management and VCs for appropriate gait pattern with upright posture  Stairs            Wheelchair Mobility    Modified Rankin (Stroke Patients Only)       Balance Overall balance assessment: Needs assistance Sitting-balance support: Single extremity supported;Feet supported Sitting balance-Leahy Scale: Good Sitting balance - Comments: Pt demonstrated good seated balance with BIL UE and LE supported, pt fearful of falling seated EOB and with ambulation     Standing balance-Leahy Scale: Fair Standing balance comment: Pt very hesistant and anxious during standing requiring assist for safe standing balance                             Pertinent Vitals/Pain Pain Assessment: No/denies pain    Home Living Family/patient expects to be discharged to:: Private residence Living Arrangements: Alone Available  Help at Discharge: Family;Available PRN/intermittently;Other (Comment) (Pt reports she has an Aide that assist her daily for 2 hrs) Type of Home: Apartment Home Access: Level entry (Pt reports there is a small step threshold to step over to get into her home)     Home Layout: One level   Additional Comments: Pt reports that her aide assist with showering and she was able to dress  independently, but slowly    Prior Function Level of Independence: Needs assistance   Gait / Transfers Assistance Needed: Pt used RW intermittently for ambulation.  ADL's / Homemaking Assistance Needed: Pt reports she has an aide assist daily for bathing, cleaning and cooking        Hand Dominance   Dominant Hand: Right    Extremity/Trunk Assessment   Upper Extremity Assessment Upper Extremity Assessment: Generalized weakness    Lower Extremity Assessment Lower Extremity Assessment: Overall WFL for tasks assessed;Generalized weakness    Cervical / Trunk Assessment Cervical / Trunk Assessment: Kyphotic  Communication   Communication: No difficulties  Cognition Arousal/Alertness: Awake/alert Behavior During Therapy: WFL for tasks assessed/performed Overall Cognitive Status: Within Functional Limits for tasks assessed                                 General Comments: Pt alert and voiced anxiousness about walking, but would like to get up      General Comments      Exercises Other Exercises Other Exercises: Completed 3 rounds of ambulation with sit<>stand transfer and rest break provided. Pt educated on the importance of using RW for all ambulation to decrease her risk for falls.   Assessment/Plan    PT Assessment Patient needs continued PT services  PT Problem List Decreased strength;Decreased activity tolerance;Decreased balance;Decreased mobility;Decreased safety awareness       PT Treatment Interventions DME instruction;Therapeutic activities;Gait training;Therapeutic exercise;Patient/family education;Stair training;Balance training;Functional mobility training    PT Goals (Current goals can be found in the Care Plan section)  Acute Rehab PT Goals Patient Stated Goal: Walk without being nervous PT Goal Formulation: With patient Time For Goal Achievement: 10/30/20 Potential to Achieve Goals: Good    Frequency Min 2X/week   Barriers to  discharge Decreased caregiver support Decreased caregiver assist    Co-evaluation               AM-PAC PT "6 Clicks" Mobility  Outcome Measure Help needed turning from your back to your side while in a flat bed without using bedrails?: A Little Help needed moving from lying on your back to sitting on the side of a flat bed without using bedrails?: A Little Help needed moving to and from a bed to a chair (including a wheelchair)?: A Little Help needed standing up from a chair using your arms (e.g., wheelchair or bedside chair)?: A Little Help needed to walk in hospital room?: A Lot Help needed climbing 3-5 steps with a railing? : A Lot 6 Click Score: 16    End of Session Equipment Utilized During Treatment: Gait belt Activity Tolerance: Patient limited by fatigue Patient left: in chair;with chair alarm set Nurse Communication: Mobility status PT Visit Diagnosis: Unsteadiness on feet (R26.81);Muscle weakness (generalized) (M62.81);History of falling (Z91.81)    Time: 7017-7939 PT Time Calculation (min) (ACUTE ONLY): 41 min   Charges:   PT Evaluation $PT Eval Low Complexity: 1 Low PT Treatments $Gait Training: 8-22 mins $Therapeutic Activity: 8-22 mins  Duanne Guess, PT, DPT 10/16/20, 1:02 PM   Isaias Cowman 10/16/2020, 12:52 PM

## 2020-10-16 NOTE — Evaluation (Signed)
Occupational Therapy Evaluation Patient Details Name: [name] MRN: [id number] DOB: 11-06-1933 Today's Date: 10/16/2020    History of Present Illness Pt is a 87 y.o. female with medical history significant of s/p for pacemaker placement due to complete heart block, HTN, HLD, stroke, GERD, gout, A fib not on AC, CAD, c diff colitis, rhabdomyolysis, CKD-IIIa, R breast cancer (s/p for mastectomy 2020), squamous cell lung cancer on chemo and radiation therapy, UTI, who presents to ED on 10/14/20 with weakness, dysuria and fall. Pt with positive UTI on 6/14, started Levaquin on 6/16.   Clinical Impression   Ms. Coscia presents today with generalized weakness and limited endurance. Denies pain, although states that she continues to have "discomfort" with urination. Pt requires Min A for transfer, with increased time and effort later in session, as pt tires quickly with OOB activity. Pt displays no LOB in standing, although appears very anxious; expresses fear of falling; displays small, shuffling gate. Recommend ongoing OT while pt is hospitalized, to improve strength, balance, and functional mobility. Recommend DC to SNF, with pt in agreement, stating that she feels "unsafe" and "too weak" to return home at present.     Follow Up Recommendations  SNF    Equipment Recommendations  None recommended by OT    Recommendations for Other Services       Precautions / Restrictions Precautions Precautions: Fall Restrictions Weight Bearing Restrictions: No      Mobility Bed Mobility Overal bed mobility: Needs Assistance Bed Mobility: Supine to Sit     Supine to sit: Min assist     General bed mobility comments: Pt received in recliner    Transfers Overall transfer level: Needs assistance Equipment used: Rolling walker (2 wheeled) Transfers: Sit to/from Stand Sit to Stand: Min assist         General transfer comment: Min A for sit<>stand x 4 trials, with pt requiring  increased time and assistance with each trial    Balance Overall balance assessment: Needs assistance Sitting-balance support: Single extremity supported;Feet supported Sitting balance-Leahy Scale: Good Sitting balance - Comments: Pt demonstrated good seated balance with BIL UE and LE supported, pt fearful of falling seated EOB and with ambulation     Standing balance-Leahy Scale: Fair Standing balance comment: Pt very hesistant and anxious during standing                           ADL either performed or assessed with clinical judgement   ADL Overall ADL's : Needs assistance/impaired                         Toilet Transfer: BSC;Moderate assistance;RW   Toileting- Clothing Manipulation and Hygiene: Moderate assistance       Functional mobility during ADLs: Minimal assistance;Rolling walker       Vision Baseline Vision/History: Wears glasses Wears Glasses: At all times Patient Visual Report: No change from baseline Additional Comments: glasses not here at hospital     Perception     Praxis      Pertinent Vitals/Pain Pain Assessment: No/denies pain     Hand Dominance Right   Extremity/Trunk Assessment Upper Extremity Assessment Upper Extremity Assessment: Generalized weakness;Overall [facility] for tasks assessed   Lower Extremity Assessment Lower Extremity Assessment: Overall WFL for tasks assessed;Generalized weakness   Cervical / Trunk Assessment Cervical / Trunk Assessment: Kyphotic   Communication Communication Communication: No difficulties   Cognition Arousal/Alertness: Awake/alert Behavior During  Therapy: WFL for tasks assessed/performed Overall Cognitive Status: Within Functional Limits for tasks assessed                                 General Comments: Pt alert and voiced anxiousness about walking, but would like to get up   General Comments  abrasion on L knee    Exercises Other Exercises Other Exercises:  Completed 3 rounds of ambulation with sit<>stand transfer and rest break provided. Pt educated on the importance of using RW for all ambulation to decrease her risk for falls. Other Exercises: Educ re: role of OT, DC options, falls prevention. Mobility, transfers, toileting   Shoulder Instructions      Home Living Family/patient expects to be discharged to:: Private residence Living Arrangements: Alone Available Help at Discharge: Family;Available PRN/intermittently;Other (Comment) (Pt has aide 2 hrs/day. Her sister and her granddaugther come by each day, and pt reports she has friends visiting on a daily basis.) Type of Home: Apartment Home Access: Level entry     Home Layout: One level     Bathroom Shower/Tub: Teacher, early years/pre: Standard     Home Equipment: Grab bars - toilet;Grab bars - tub/shower;Tub bench;Walker - 2 wheels;Toilet riser   Additional Comments: Pt reports that her aide assist with showering and she was able to dress independently, but slowly      Prior Functioning/Environment Level of Independence: Needs assistance  Gait / Transfers Assistance Needed: Pt used RW intermittently for ambulation. ADL's / Homemaking Assistance Needed: Pt reports she has an aide assist daily for bathing, cleaning and cooking. Her sister drives her to/from medical appts            OT Problem List: Decreased strength;Decreased activity tolerance;Impaired balance (sitting and/or standing);Decreased coordination      OT Treatment/Interventions: Self-care/ADL training;Patient/family education;Therapeutic exercise;Balance training;Energy conservation;Therapeutic activities;DME and/or AE instruction    OT Goals(Current goals can be found in the care plan section) Acute Rehab OT Goals Patient Stated Goal: to not fall again OT Goal Formulation: With patient Time For Goal Achievement: 10/30/20 Potential to Achieve Goals: Good ADL Goals Pt Will Perform Grooming: with  modified independence;standing Pt Will Transfer to Toilet: stand pivot transfer;regular height toilet;with modified independence Pt/caregiver will Perform Home Exercise Program: Increased ROM;Increased strength;Independently;With written HEP provided  OT Frequency: Min 1X/week   Barriers to D/C:            Co-evaluation              AM-PAC OT "6 Clicks" Daily Activity     Outcome Measure Help from another person eating meals?: None Help from another person taking care of personal grooming?: A Little Help from another person toileting, which includes using toliet, bedpan, or urinal?: A Lot Help from another person bathing (including washing, rinsing, drying)?: A Lot Help from another person to put on and taking off regular upper body clothing?: A Little Help from another person to put on and taking off regular lower body clothing?: A Lot 6 Click Score: 16   End of Session Equipment Utilized During Treatment: Rolling walker  Activity Tolerance: Patient tolerated treatment well Patient left: in chair;with call bell/phone within reach  OT Visit Diagnosis: Unsteadiness on feet (R26.81);Muscle weakness (generalized) (M62.81)                Time: 4259-5638 OT Time Calculation (min): 47 min Charges:  OT General Charges $OT Visit:  1 Visit OT Evaluation $OT Eval Moderate Complexity: 1 Mod OT Treatments $Self Care/Home Management : 38-52 mins Josiah Lobo, PhD, MS, OTR/L 10/16/20, 1:31 PM

## 2020-10-16 NOTE — Progress Notes (Signed)
Progress Note  Patient Name: [name] Date of Encounter: 10/16/2020  Primary Cardiologist: Virl Axe, MD  Subjective   Slept well and feels well this AM. Currently eating breakfast.  Hasn't been out of bed yet.  Inpatient Medications    Scheduled Meds:  allopurinol  100 mg Oral Daily   amLODipine  5 mg Oral Daily   clopidogrel  75 mg Oral Daily   dexamethasone  4 mg Oral Daily   latanoprost  1 drop Both Eyes QHS   letrozole  2.5 mg Oral Daily   pantoprazole  40 mg Oral Daily   simvastatin  40 mg Oral q1800   Continuous Infusions:   ceFAZolin (ANCEF) IV 1 g (10/16/20 0106)   PRN Meds: acetaminophen, hydrALAZINE, hydrOXYzine, lidocaine-prilocaine, prochlorperazine   Vital Signs    Vitals:   10/15/20 1700 10/15/20 1900 10/15/20 2057 10/16/20 0452  BP: 127/72 116/65 129/78 117/83  Pulse: 61 61 62 61  Resp: 17 14 16 16   Temp:   98.3 F (36.8 C) 97.8 F (36.6 C)  TempSrc:      SpO2: 96% 99% 98% 100%   No intake or output data in the 24 hours ending 10/16/20 0800 There were no vitals filed for this visit.  Physical Exam   GEN: Well nourished, well developed, in no acute distress.  HEENT: Grossly normal.  Neck: Supple, no JVD, carotid bruits, or masses. Cardiac: RRR, no murmurs, rubs, or gallops. No clubbing, cyanosis, edema.  Radials 2+, DP/PT 2+ and equal bilaterally.  Respiratory:  Respirations regular and unlabored, clear to auscultation bilaterally. GI: Soft, nontender, nondistended, BS + x 4. MS: no deformity or atrophy. Skin: warm and dry, no rash. Neuro:  Strength and sensation are intact. Psych: AAOx3.  Normal affect.  Labs    Chemistry Recent Labs  Lab 10/11/20 0901 10/14/20 2148 10/15/20 0233 10/16/20 0602  NA 134* 130*  --  135  K 4.8 5.9* 5.8* 4.1  CL 99 99  --  104  CO2 25 21*  --  25  GLUCOSE 100* 141*  --  89  BUN 33* 53*  --  25*  CREATININE 0.91 1.05*  --  0.72  CALCIUM 10.1 10.4*  --  8.9  PROT 8.0 8.4*  --   --    ALBUMIN 3.5 3.8  --   --   AST 18 24  --   --   ALT 18 24  --   --   ALKPHOS 47 44  --   --   BILITOT 0.7 1.1  --   --   GFRNONAA >60 52*  --  >60  ANIONGAP 10 10  --  6     Hematology Recent Labs  Lab 10/11/20 0901 10/14/20 2148 10/16/20 0602  WBC 3.7* 3.8* 2.0*  RBC 4.60 5.08 3.94  HGB 12.1 13.3 10.5*  HCT 38.5 42.0 33.0*  MCV 83.7 82.7 83.8  MCH 26.3 26.2 26.6  MCHC 31.4 31.7 31.8  RDW 19.0* 18.7* 18.1*  PLT 157 128* 94*    Cardiac Enzymes  Recent Labs  Lab 10/14/20 2148 10/15/20 0032 10/15/20 1044 10/15/20 1430  TROPONINIHS 103* 87* 56* 43*      Lipids  Lab Results  Component Value Date   CHOL 111 10/16/2020   HDL 28 (L) 10/16/2020   LDLCALC 52 10/16/2020   TRIG 155 (H) 10/16/2020   CHOLHDL 4.0 10/16/2020    HbA1c  Lab Results  Component Value Date   HGBA1C 6.3 (  H) 05/28/2018    Radiology    CT HEAD WO CONTRAST  Result Date: 10/15/2020 CLINICAL DATA:  Head trauma, minor. EXAM: CT HEAD WITHOUT CONTRAST TECHNIQUE: Contiguous axial images were obtained from the base of the skull through the vertex without intravenous contrast. COMPARISON:  Prior head CT examinations 08/15/2020 and earlier. FINDINGS: Brain: Mildly motion degraded exam. Moderate cerebral atrophy.  Comparatively mild cerebellar atrophy. Redemonstrated chronic lacunar infarcts within the left internal capsule as well as right basal ganglia and adjacent radiating white matter tracts. Background moderate patchy and ill-defined hypoattenuation within the cerebral white matter, nonspecific but compatible with chronic small vessel ischemic disease. Redemonstrated chronic infarct within the inferior left cerebellar hemisphere. There is no acute intracranial hemorrhage. No demarcated cortical infarct. No extra-axial fluid collection. No evidence of intracranial mass. No midline shift. Vascular: No hyperdense vessel.  Atherosclerotic calcifications. Skull: Normal. Negative for fracture or focal lesion.  Sinuses/Orbits: Visualized orbits show no acute finding. Small volume frothy secretions and mild mucosal thickening within the bilateral maxillary sinuses. Mild bilateral ethmoid sinus mucosal thickening. IMPRESSION: No evidence of acute intracranial abnormality. Stable parenchymal atrophy and chronic small vessel ischemic disease with chronic lacunar infarcts, as described. Redemonstrated chronic infarct within the inferior left cerebellar hemisphere. Paranasal sinus disease at the imaged levels, as described. Electronically Signed   By: Kellie Simmering DO   On: 10/15/2020 09:28   DG Chest Portable 1 View  Result Date: 10/14/2020 CLINICAL DATA:  Recent fall with weakness, initial encounter EXAM: PORTABLE CHEST 1 VIEW COMPARISON:  07/24/2020, PET-CT from 08/15/2020 FINDINGS: Right-sided chest wall port is now seen in satisfactory position. Cardiac shadow is stable. Pacing device is again noted. Improved aeration in the right base is noted. Nodular density is noted in the right base similar to that seen on prior PET-CT. IMPRESSION: Nodular density in the right lung base similar to that seen on prior PET-CT. The large central area of mass and consolidation has resolved on the right. Electronically Signed   By: Inez Catalina M.D.   On: 10/14/2020 22:17   Telemetry    V-paced; AV-paced - Personally Reviewed  Cardiac Studies   2D Echocardiogram 6.18.2022  1. Left ventricular ejection fraction, by estimation, is 50 %. The left  ventricle has low normal function. The left ventricle demonstrates  regional wall motion abnormalities (septal wall motion abnormality,  possibly secondary to paced rhythym). Left  ventricular diastolic parameters are indeterminate.   2. Right ventricular systolic function was not well visualized. The right  ventricular size is normal.   Patient Profile     87 y.o. female with a history of CHB s/p PPM, PAF (noted on device interrogation), HTN, HL, PAH, CKD III, GERD, gout,  breast cancer, R lung cancer (currently on chemo/XRT), and CVA, who presented 6/17 w/ a 1 wk h/o progressive weakness in the setting of UTI/chemo/XRT, and was found to have an elevated HsTrop (103  87  56  43).  Assessment & Plan    1.  Elevated HsTroponin:  Pt presented to the ED late on 6/17 w/ a 1 week h/o progressive, acute on chronic weakness and fatigue w/ leg weakness and fall on 6/16.  She denies any recent h/o chest pain or dyspnea.  She was found to have a UTI earlier this week and just started treatment for this on 6/16 (after fall).  In the setting of weakness, HsTrop was eval and noted to be elevated, initially @ 103 w/ subsequent values of 83  56  43.  ECG showed atrial tachycardia w/ CHB and V pacing.  She denied chest pain or dyspnea.  Echo w/ EF 50% w/ septal wma, possibly 2/2 paced rhythm.  Suspect demand ischemia in the setting of mild dehydration, recent chemo, ongoing XRT.  Cont statin.  No ASA in setting of thrombocytopenia.  Will consider outpt myoview for risk stratification.  2. Weakness/Fatigue:  Acute on chronic worsening in the past week in the setting of ongoing chemoRx and XRT, along w/ recent dx of UTI.  BUN/Creat elevated above baseline on admission  Improved w/ hydration this AM.  Would continue to hold prev home dose of HCTZ, as she has had significant weight loss and poor PO intake since 2020.  Abx for UTI per IM.  3.  UTI:  Dysuria/polyuria over the past week.  Abx per IM.P mirabilis UTI dx 6/16.    4.  PAF/PAT: Pacer interrogated in ED and reviewed in detail.  Since early May, pt has had more frequent high atrial rate episodes, particularly since late-May.  These are generally sub-one minute episodes, but she has had 71 episodes between 1 and 4 hrs, and 4 episodes > 4 hrs w/ a longest episode of 4 hrs 50 mins on 5/29.  Average V rate during these episodes is typically in the 60's as she is heavily pacer dependent (>80% V pacing during AHR episodes) in the setting of  CHB.  Admission ECG notable for V paced rhythm w/ underlying atrial tachycardia w/ A rate ~ 170 bpm.  Discussed w/ Dr. Caryl Comes today.  From pacer interrogation, alone, it's difficult to know the extent to which she's having afib vs atrial tachycardia, though at least one AHR was notable for an atrial rate of >400 bpm.  It's possible that elevated atrial rates may be playing a role in fatigue, and just as likely that increased atrial irritability may be secondary to physiologic stress of chemo/xrt/dehydration.  Echo w/ EF 50%.  Atrial appendage emptying velocity unable to be determined by TTE. At this point, there is no role for [facility], though when resuming Ca2+ channel blocker, she would likely benefit from transitioning to diltiazem instead of amlodipine.  5.  CHB:  s/p PPM dating back to 2006 w/ upgrade in 2015.  Nl device fxn.  6.  Hyperkalemia:  resolved w/ IVF.  Home dose of KCl on hold.  7.  Hypomagnesemia:  Supplemented 6/18.  Nl this AM.  8.  HL:  cont simva.  9.  PAH:  RVSP 48.4 on echo in 2020.  RV not well-visualized on yesterday's echo.  Euvolemic.  HCTZ on hold.  10.  H/o CVA:  was on plavix chronically @ home.  Plts 94 this am and plavix on hold.  11.  Acute on chronic stage III kidney dzs:  diuretic held.  Renal fxn improved w/ IVF yesterday.  12.  Essential HTN:  stable.  Amlodipine was resumed this AM.  Changing to oral diltiazem cd in setting of more frequent high atrial rates.  Follow.  Signed, Murray Hodgkins, NP  10/16/2020, 8:00 AM    For questions or updates, please contact   Please consult [email or web address] for contact info under Cardiology/STEMI.

## 2020-10-16 NOTE — Progress Notes (Signed)
[facility] Progress Note  Patient: [name]    BZJ:[id number]  DOA: 10/14/2020     Date of Service: the patient was seen and examined on 10/16/2020  Brief hospital course: Past medical history of HTN, HLD, CVA, GERD, gout, A. fib not on AC, CAD, CKD 3A, breast cancer right, squamous cell lung cancer PPM implant for CHB. Presented generalized weakness.  Found to have UTI and elevated troponin as well as tachyarrhythmia. Cardiology following for elevated troponin tachyarrhythmia. Currently on IV antibiotics for UTI. Currently plan is monitor for improvement on current therapy.  Assessment and Plan: 1.  UTI, sepsis ruled out Recent positive culture for Proteus in 6/14. Outpatient treatment with Levaquin. Continues to have symptoms with fatigue and tiredness.  Brought to the hospital. Currently on IV Rocephin. Received IV fluids. We will hold off on IV fluids for now.  Monitor cultures.  2.  Elevated troponin. Likely demand ischemia. [place] cardiology consultation. Not consistent with ACS. Outpatient further work-up.  3.  Tachyarrhythmia/paroxysmal A. fib. HTN. CHB SP PPM implant. Cardiology discussed with EP.  We will initiate low-dose metoprolol.  Holding amlodipine and HCTZ. Continue to monitor on telemetry. Remains off of the anticoagulation  4.  CVA history HLD Continue statin. Continue Plavix.  5.  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 Continue letrozole. On chemoradiation right now. Patient has chronic leukopenia.  Appears to have acute thrombocytopenia.  Last session of chemotherapy was on 6/14.  Monitor.  If continues to remain low may require oncology input, Nplate.  6.  CKD 3A Hyperkalemia Hypercalcemia Renal function remained stable. Received Lokelma and IV fluids. Currently improving. Monitor.  Diet: Cardiac diet DVT Prophylaxis:    Place and maintain sequential compression device Start: 10/16/20 0750    Advance goals of care discussion: Full code  Family Communication: no family was present at bedside, at the time of interview.   Disposition:  Status is: Inpatient  Remains inpatient appropriate because:Inpatient level of care appropriate due to severity of illness  Dispo: The patient is from: Home              Anticipated d/c is to: SNF              Patient currently is not medically stable to d/c.   Difficult to place patient No        Subjective: No nausea no vomiting.  No chest pain.  Continues to have some cough and shortness of breath.  No fever no chills.  Also reports fatigue and tiredness.  Physical Exam:  General: Appear in mild distress, no Rash; Oral Mucosa Clear, moist. no Abnormal Neck Mass Or lumps, Conjunctiva normal  Cardiovascular: S1 and S2 Present, no Murmur, Respiratory: good respiratory effort, Bilateral Air entry present and faint basal crackles, no wheezes Abdomen: Bowel Sound present, Soft and no tenderness Extremities: bilateral  Pedal edema Neurology: alert and oriented to time, place, and person affect appropriate. no new focal deficit Gait not checked due to patient safety concerns    Vitals:   10/16/20 0810 10/16/20 1155 10/16/20 1229 10/16/20 1611  BP: 116/70 111/64  118/71  Pulse: 61 (!) 59  (!) 59  Resp: 16 16  17   Temp: 97.7 F (36.5 C) 98.1 F (36.7 C)  98 F (36.7 C)  TempSrc: Oral Oral  Oral  SpO2: 100% 100% 100% 100%    Intake/Output Summary (Last 24 hours) at 10/16/2020 1656 Last  data filed at 10/16/2020 1158 Gross per 24 hour  Intake 360 ml  Output 450 ml  Net -90 ml   There were no vitals filed for this visit.  Data Reviewed: I have personally reviewed and interpreted daily labs, tele strips, imaging. I reviewed all nursing notes, pharmacy notes, vitals, pertinent old records I have discussed plan of care as described above with RN and  patient/family.  CBC: Recent Labs  Lab 10/11/20 0901 10/14/20 2148 10/16/20 0602  WBC 3.7* 3.8* 2.0*  NEUTROABS 2.8 3.6  --   HGB 12.1 13.3 10.5*  HCT 38.5 42.0 33.0*  MCV 83.7 82.7 83.8  PLT 157 128* 94*   Basic Metabolic Panel: Recent Labs  Lab 10/11/20 0901 10/14/20 2148 10/15/20 0233 10/15/20 1044 10/16/20 0602  NA 134* 130*  --   --  135  K 4.8 5.9* 5.8*  --  4.1  CL 99 99  --   --  104  CO2 25 21*  --   --  25  GLUCOSE 100* 141*  --   --  89  BUN 33* 53*  --   --  25*  CREATININE 0.91 1.05*  --   --  0.72  CALCIUM 10.1 10.4*  --   --  8.9  MG  --   --   --  1.6* 1.9    Studies: No results found.  Scheduled Meds:  allopurinol  100 mg Oral Daily   clopidogrel  75 mg Oral Daily   dexamethasone  4 mg Oral Daily   dextromethorphan-guaiFENesin  1 tablet Oral BID   [START ON 10/17/2020] diltiazem  120 mg Oral Daily   latanoprost  1 drop Both Eyes QHS   letrozole  2.5 mg Oral Daily   pantoprazole  40 mg Oral Daily   simvastatin  40 mg Oral q1800   Continuous Infusions:   ceFAZolin (ANCEF) IV 1 g (10/16/20 0106)   PRN Meds: acetaminophen, hydrALAZINE, hydrOXYzine, lidocaine-prilocaine, prochlorperazine  Time spent: 35 minutes  Author: Berle Mull, MD [facility] 10/16/2020 4:56 PM  To reach On-call, see care teams to locate the attending and reach out via [email or web address]. Between 7PM-7AM, please contact night-coverage If you still have difficulty reaching the attending provider, please page the [facility] (Director on Call) for [facility] on amion for assistance.

## 2020-10-16 NOTE — Plan of Care (Signed)
  Problem: Nutrition: Goal: Adequate nutrition will be maintained Outcome: Progressing   Problem: Coping: Goal: Level of anxiety will decrease Outcome: Progressing   Problem: Elimination: Goal: Will not experience complications related to urinary retention Outcome: Progressing   Problem: Pain Managment: Goal: General experience of comfort will improve Outcome: Progressing

## 2020-10-17 ENCOUNTER — Ambulatory Visit: Payer: Medicare HMO

## 2020-10-17 DIAGNOSIS — I248 Other forms of acute ischemic heart disease: Secondary | ICD-10-CM | POA: Diagnosis not present

## 2020-10-17 DIAGNOSIS — E43 Unspecified severe protein-calorie malnutrition: Secondary | ICD-10-CM | POA: Insufficient documentation

## 2020-10-17 LAB — COMPREHENSIVE METABOLIC PANEL
ALT: 37 U/L (ref 0–44)
AST: 33 U/L (ref 15–41)
Albumin: 2.9 g/dL — ABNORMAL LOW (ref 3.5–5.0)
Alkaline Phosphatase: 37 U/L — ABNORMAL LOW (ref 38–126)
Anion gap: 7 (ref 5–15)
BUN: 19 mg/dL (ref 8–23)
CO2: 23 mmol/L (ref 22–32)
Calcium: 8.3 mg/dL — ABNORMAL LOW (ref 8.9–10.3)
Chloride: 103 mmol/L (ref 98–111)
Creatinine, Ser: 0.55 mg/dL (ref 0.44–1.00)
GFR, Estimated: >60 mL/min (ref >60)
Glucose, Bld: 98 mg/dL (ref 70–99)
Potassium: 3.8 mmol/L (ref 3.5–5.1)
Sodium: 133 mmol/L — ABNORMAL LOW (ref 135–145)
Total Bilirubin: 0.7 mg/dL (ref 0.3–1.2)
Total Protein: 6.2 g/dL — ABNORMAL LOW (ref 6.5–8.1)

## 2020-10-17 LAB — CBC WITH DIFFERENTIAL/PLATELET
Abs Immature Granulocytes: 0.04 10*3/uL (ref 0.00–0.07)
Basophils Absolute: 0 10*3/uL (ref 0.0–0.1)
Basophils Relative: 0 %
Eosinophils Absolute: 0 10*3/uL (ref 0.0–0.5)
Eosinophils Relative: 0 %
HCT: 30.2 % — ABNORMAL LOW (ref 36.0–46.0)
Hemoglobin: 9.9 g/dL — ABNORMAL LOW (ref 12.0–15.0)
Immature Granulocytes: 2 %
Lymphocytes Relative: 10 %
Lymphs Abs: 0.2 10*3/uL — ABNORMAL LOW (ref 0.7–4.0)
MCH: 27 pg (ref 26.0–34.0)
MCHC: 32.8 g/dL (ref 30.0–36.0)
MCV: 82.3 fL (ref 80.0–100.0)
Monocytes Absolute: 0 10*3/uL — ABNORMAL LOW (ref 0.1–1.0)
Monocytes Relative: 2 %
Neutro Abs: 2 10*3/uL (ref 1.7–7.7)
Neutrophils Relative %: 86 %
Platelets: 80 10*3/uL — ABNORMAL LOW (ref 150–400)
RBC: 3.67 MIL/uL — ABNORMAL LOW (ref 3.87–5.11)
RDW: 17.6 % — ABNORMAL HIGH (ref 11.5–15.5)
WBC: 2.3 10*3/uL — ABNORMAL LOW (ref 4.0–10.5)
nRBC: 0 % (ref 0.0–0.2)

## 2020-10-17 LAB — MAGNESIUM: Magnesium: 1.5 mg/dL — ABNORMAL LOW (ref 1.7–2.4)

## 2020-10-17 LAB — HEMOGLOBIN A1C
Hgb A1c MFr Bld: 5.6 % (ref 4.8–5.6)
Mean Plasma Glucose: 114 mg/dL

## 2020-10-17 MED ORDER — ADULT MULTIVITAMIN W/MINERALS CH
1.0000 | ORAL_TABLET | Freq: Every day | ORAL | Status: DC
  Administered 2020-10-17 – 2020-10-22 (×6): 1 via ORAL
  Filled 2020-10-17 (×6): qty 1

## 2020-10-17 MED ORDER — ENSURE ENLIVE PO LIQD
237.0000 mL | Freq: Three times a day (TID) | ORAL | Status: DC
  Administered 2020-10-17 – 2020-10-22 (×14): 237 mL via ORAL

## 2020-10-17 MED ORDER — MAGNESIUM SULFATE 2 GM/50ML IV SOLN
2.0000 g | Freq: Once | INTRAVENOUS | Status: AC
  Administered 2020-10-17: 2 g via INTRAVENOUS
  Filled 2020-10-17: qty 50

## 2020-10-17 NOTE — Progress Notes (Signed)
Progress Note  Patient Name: Kenn File Date of Encounter: 10/17/2020  Primary Cardiologist: Virl Axe, MD  Subjective   Feels well this AM.  Thinks that strength is improving.  Worked w/ PT/OT yesterday - fatigue and anxiety noted (scared of falling).  Current rec for SNF, as she previously lived alone.  Inpatient Medications    Scheduled Meds:  allopurinol  100 mg Oral Daily   clopidogrel  75 mg Oral Daily   dexamethasone  4 mg Oral Daily   dextromethorphan-guaiFENesin  1 tablet Oral BID   diltiazem  120 mg Oral Daily   latanoprost  1 drop Both Eyes QHS   letrozole  2.5 mg Oral Daily   pantoprazole  40 mg Oral Daily   simvastatin  40 mg Oral q1800   Continuous Infusions:   ceFAZolin (ANCEF) IV 1 g (10/17/20 0550)   PRN Meds: acetaminophen, hydrALAZINE, hydrOXYzine, lidocaine-prilocaine, prochlorperazine   Vital Signs    Vitals:   10/16/20 1937 10/17/20 0322 10/17/20 0842 10/17/20 1004  BP: 126/77 120/68 118/73   Pulse: 64 63 61   Resp: 16 14    Temp: 98.4 F (36.9 C) 97.7 F (36.5 C)    TempSrc:  Oral    SpO2: 100% 100% 100%   Height:    5\' 2"  (1.575 m)    Intake/Output Summary (Last 24 hours) at 10/17/2020 1101 Last data filed at 10/17/2020 1015 Gross per 24 hour  Intake 621.71 ml  Output 1050 ml  Net -428.29 ml   There were no vitals filed for this visit.  Physical Exam   GEN: Well nourished, well developed, in no acute distress.  HEENT: Grossly normal.  Neck: Supple, no JVD, carotid bruits, or masses. Cardiac: RRR, no murmurs, rubs, or gallops. No clubbing, cyanosis, edema.  Radials 2+, DP/PT 2+ and equal bilaterally.  Respiratory:  Respirations regular and unlabored, clear to auscultation bilaterally. GI: Soft, nontender, nondistended, BS + x 4. MS: no deformity or atrophy. Skin: warm and dry, no rash. Neuro:  Strength and sensation are intact. Psych: AAOx3.  Normal affect.  Labs    Chemistry Recent Labs  Lab 10/11/20 0901  10/14/20 2148 10/15/20 0233 10/16/20 0602 10/17/20 0847  NA 134* 130*  --  135 133*  K 4.8 5.9* 5.8* 4.1 3.8  CL 99 99  --  104 103  CO2 25 21*  --  25 23  GLUCOSE 100* 141*  --  89 98  BUN 33* 53*  --  25* 19  CREATININE 0.91 1.05*  --  0.72 0.55  CALCIUM 10.1 10.4*  --  8.9 8.3*  PROT 8.0 8.4*  --   --  6.2*  ALBUMIN 3.5 3.8  --   --  2.9*  AST 18 24  --   --  33  ALT 18 24  --   --  37  ALKPHOS 47 44  --   --  37*  BILITOT 0.7 1.1  --   --  0.7  GFRNONAA >60 52*  --  >60 >60  ANIONGAP 10 10  --  6 7     Hematology Recent Labs  Lab 10/14/20 2148 10/16/20 0602 10/17/20 0847  WBC 3.8* 2.0* 2.3*  RBC 5.08 3.94 3.67*  HGB 13.3 10.5* 9.9*  HCT 42.0 33.0* 30.2*  MCV 82.7 83.8 82.3  MCH 26.2 26.6 27.0  MCHC 31.7 31.8 32.8  RDW 18.7* 18.1* 17.6*  PLT 128* 94* 80*    Cardiac Enzymes  Recent  Labs  Lab 10/14/20 2148 10/15/20 0032 10/15/20 1044 10/15/20 1430  TROPONINIHS 103* 87* 56* 43*     Lipids  Lab Results  Component Value Date   CHOL 111 10/16/2020   HDL 28 (L) 10/16/2020   LDLCALC 52 10/16/2020   TRIG 155 (H) 10/16/2020   CHOLHDL 4.0 10/16/2020    HbA1c  Lab Results  Component Value Date   HGBA1C 5.6 10/15/2020    Radiology    CT HEAD WO CONTRAST  Result Date: 10/15/2020 CLINICAL DATA:  Head trauma, minor. EXAM: CT HEAD WITHOUT CONTRAST TECHNIQUE: Contiguous axial images were obtained from the base of the skull through the vertex without intravenous contrast. COMPARISON:  Prior head CT examinations 08/15/2020 and earlier. FINDINGS: Brain: Mildly motion degraded exam. Moderate cerebral atrophy.  Comparatively mild cerebellar atrophy. Redemonstrated chronic lacunar infarcts within the left internal capsule as well as right basal ganglia and adjacent radiating white matter tracts. Background moderate patchy and ill-defined hypoattenuation within the cerebral white matter, nonspecific but compatible with chronic small vessel ischemic disease.  Redemonstrated chronic infarct within the inferior left cerebellar hemisphere. There is no acute intracranial hemorrhage. No demarcated cortical infarct. No extra-axial fluid collection. No evidence of intracranial mass. No midline shift. Vascular: No hyperdense vessel.  Atherosclerotic calcifications. Skull: Normal. Negative for fracture or focal lesion. Sinuses/Orbits: Visualized orbits show no acute finding. Small volume frothy secretions and mild mucosal thickening within the bilateral maxillary sinuses. Mild bilateral ethmoid sinus mucosal thickening. IMPRESSION: No evidence of acute intracranial abnormality. Stable parenchymal atrophy and chronic small vessel ischemic disease with chronic lacunar infarcts, as described. Redemonstrated chronic infarct within the inferior left cerebellar hemisphere. Paranasal sinus disease at the imaged levels, as described. Electronically Signed   By: Kellie Simmering DO   On: 10/15/2020 09:28   DG Chest Portable 1 View  Result Date: 10/14/2020 CLINICAL DATA:  Recent fall with weakness, initial encounter EXAM: PORTABLE CHEST 1 VIEW COMPARISON:  07/24/2020, PET-CT from 08/15/2020 FINDINGS: Right-sided chest wall port is now seen in satisfactory position. Cardiac shadow is stable. Pacing device is again noted. Improved aeration in the right base is noted. Nodular density is noted in the right base similar to that seen on prior PET-CT. IMPRESSION: Nodular density in the right lung base similar to that seen on prior PET-CT. The large central area of mass and consolidation has resolved on the right. Electronically Signed   By: Inez Catalina M.D.   On: 10/14/2020 22:17   Telemetry    V paced w/ occas AV pacing - Personally Reviewed  Cardiac Studies   2D Echocardiogram 6.18.2022   1. Left ventricular ejection fraction, by estimation, is 50 %. The left  ventricle has low normal function. The left ventricle demonstrates  regional wall motion abnormalities (septal wall motion  abnormality,  possibly secondary to paced rhythym). Left  ventricular diastolic parameters are indeterminate.   2. Right ventricular systolic function was not well visualized. The right  ventricular size is normal.  Patient Profile     87 y.o. female with a history of CHB s/p PPM, PAF (noted on device interrogation), HTN, HL, PAH, CKD III, GERD, gout, breast cancer, R lung cancer (currently on chemo/XRT), and CVA, who presented 6/17 w/ a 1 wk h/o progressive weakness in the setting of UTI/chemo/XRT, and was found to have an elevated HsTrop (103  87  56  43).   Assessment & Plan    1.  Elevated HsTroponin:  Pt presented to the ED late on  6/17 w/ a 1 week h/o progressive, acute on chronic weakness and fatigue w/ leg weakness and fall on 6/16.  She denies any recent h/o chest pain or dyspnea.  She was found to have a UTI earlier this week and just started treatment for this on 6/16 (after fall).  In the setting of weakness, HsTrop was eval and noted to be elevated, initially @ 103 w/ subsequent values of 83  56  43.  ECG showed atrial tachycardia w/ CHB and V pacing.  She denied chest pain or dyspnea.  Echo w/ EF 50% w/ septal wma, possibly 2/2 paced rhythm.  Suspect demand ischemia in the setting of mild dehydration, recent chemo, ongoing XRT.  Cont statin.  No ASA in setting of thrombocytopenia.  Will consider outpt myoview for risk stratification.   2. Weakness/Fatigue:  Acute on chronic worsening in the past week in the setting of ongoing chemoRx and XRT, along w/ recent dx of UTI.  BUN/Creat elevated above baseline on admission  Improved w/ hydration and stable this AM. Would continue to hold prev home dose of HCTZ, as she has had significant weight loss and poor PO intake since 2020.  Abx for UTI per IM.   3.  UTI:  Dysuria/polyuria over the past week.  P mirabilis UTI dx 6/16.  Abx per IM.    4.  PAF/PAT: Pacer interrogated in ED and reviewed in detail previously.  More frequent high atrial  rates since late May - likely represents predominantly atrial tachycardia, though Afib not completely excluded.  In the setting of CHB, she has V rates that trend in the 60's regardless of atrial rate.  Reviewed w/ Dr. Caryl Comes on 6/19.  Changed PO amlodipine to diltiazem.  It's possible that elevated atrial rates may be playing a role in fatigue, and just as likely that increased atrial irritability may be secondary to physiologic stress of chemo/xrt/dehydration.  Echo w/ EF 50%.  Atrial appendage emptying velocity unable to be determined by TTE. At this point, there is no role for [facility].   5.  CHB:  s/p PPM dating back to 2006 w/ upgrade in 2015.  Nl device fxn.   6.  Hyperkalemia:  resolved w/ IVF.  Home dose of KCl on hold.   7.  Hypomagnesemia:  Supplemented 6/18.    8.  HL:  cont simva.   9.  PAH:  RVSP 48.4 on echo in 2020.  RV not well-visualized on yesterday's echo.  Euvolemic.  HCTZ on hold.   10.  H/o CVA:  was on plavix chronically @ home.  Plts 94 this am and plavix on hold.   11.  Acute on chronic stage III kidney dzs:  diuretic held.  Renal fxn improved w/ IVF and stable this AM.   12.  Essential HTN:  stable.  Now on oral dilt instead of amlodipine.    13.  Pancytopenia/Lung cancer:  H/H drifting down.  Plts 80k this AM.  Plavix held.  Per IM and heme/onc.  Signed, Murray Hodgkins, NP  10/17/2020, 11:01 AM    For questions or updates, please contact   Please consult [email or web address] for contact info under Cardiology/STEMI.

## 2020-10-17 NOTE — TOC Initial Note (Addendum)
Transition of Care [facility]) - Initial/Assessment Note    Patient Details  Name: [name] MRN: [id number] Date of Birth: Nov 17, 1933  [facility] CM/SW Contact:    Alberteen Sam, LCSW Phone Number: 10/17/2020, 12:05 PM  Clinical Narrative:                  Update 3:15 pm: daughter to take home tomorrow with max home health services of PT, OT, RN aide and social work through [place] at [place] as well as DME wheel chair and 3in1 via [place].   Update 2:00 pm: patient daughter had preference of [facility] or [facility], neither are in network with [employer]. Daughter reports she may be taking patient home, discussed home health options. Daughter will call me back.   CSW spoke with patient regarding SNF rec, patient is in agreement and requested CSW call her sister Vivien Rota.   CSW called Vivien Rota who reports preference for [facility] as patient has been there in the past and did well with therapies.   CSW to fax referral and start insurance auth.    Expected Discharge Plan: Skilled Nursing Facility Barriers to Discharge: Continued Medical Work up   Patient Goals and CMS Choice Patient states their goals for this hospitalization and ongoing recovery are:: to go to SNF CMS Medicare.gov Compare Post Acute Care list provided to:: Patient Choice offered to / list presented to : Patient  Expected Discharge Plan and Services Expected Discharge Plan: [place]       Living arrangements for the past 2 months: Single Family Home                                      Prior Living Arrangements/Services Living arrangements for the past 2 months: Single Family Home Lives with:: Self Patient language and need for interpreter reviewed:: Yes Do you feel safe going back to the place where you live?: No   needs short term rehab  Need for Family Participation in Patient Care: Yes (Comment) Care giver support system in place?: Yes (comment)   Criminal  Activity/Legal Involvement Pertinent to Current Situation/Hospitalization: No - Comment as needed  Activities of Daily Living Home Assistive Devices/Equipment: None ADL Screening (condition at time of admission) Patient's cognitive ability adequate to safely complete daily activities?: Yes Is the patient deaf or have difficulty hearing?: No Does the patient have difficulty seeing, even when wearing glasses/contacts?: No Does the patient have difficulty concentrating, remembering, or making decisions?: No Patient able to express need for assistance with ADLs?: Yes Does the patient have difficulty dressing or bathing?: Yes Independently performs ADLs?: No Communication: Independent Dressing (OT): Needs assistance Is this a change from baseline?: Change from baseline, expected to last <3days Grooming: Independent Feeding: Independent Bathing: Needs assistance Is this a change from baseline?: Change from baseline, expected to last <3 days Toileting: Needs assistance Is this a change from baseline?: Change from baseline, expected to last <3 days In/Out Bed: Needs assistance Is this a change from baseline?: Change from baseline, expected to last <3 days Walks in Home: Needs assistance Is this a change from baseline?: Change from baseline, expected to last <3 days Does the patient have difficulty walking or climbing stairs?: No Weakness of Legs: Both Weakness of Arms/Hands: None  Permission Sought/Granted Permission sought to share information with : Case Manager, Customer service manager, Family Supports Permission granted to share information  with : Yes, Verbal Permission Granted  Share Information with NAME: Vivien Rota  Permission granted to share info w AGENCY: SNFs  Permission granted to share info w Relationship: sister  Permission granted to share info w Contact Information: [phone]  Emotional Assessment Appearance:: Appears stated age Attitude/Demeanor/Rapport:  Gracious Affect (typically observed): Calm Orientation: : Oriented to Self, Oriented to Place, Oriented to  Time, Oriented to Situation Alcohol / Substance Use: Not Applicable Psych Involvement: No (comment)  Admission diagnosis:  Dehydration [E86.0] Hyperkalemia [E87.5] UTI (urinary tract infection) [N39.0] Weakness [R53.1] Elevated troponin [R77.8] Acute UTI [N39.0] Malignant neoplasm of left lung, unspecified part of lung (HCC) [C34.92] Patient Active Problem List   Diagnosis Date Noted   Weakness 10/15/2020   UTI (urinary tract infection) 10/15/2020   Fall at home, initial encounter 10/15/2020   CKD (chronic kidney disease), stage IIIa 10/15/2020   Hyperkalemia 10/15/2020   Hypercalcemia 10/15/2020   Severe sepsis ([place]) 10/15/2020   Squamous cell carcinoma of right lung ([place]) 08/05/2020   Hemoptysis 07/25/2020   Malignant neoplasm of upper-outer quadrant of right breast in female, estrogen receptor positive ([place]) 10/31/2018   Enteritis due to Clostridium difficile    Malnutrition of moderate degree 07/15/2018   Cough    Ileus ([name])    Low blood magnesium 07/13/2018   Vomiting 07/13/2018   Loss of weight 06/27/2018   Rhabdomyolysis 05/26/2018   History of CVA (cerebrovascular accident) 05/26/2018   Hyperlipidemia 05/26/2018   AKI (acute kidney injury) ([place]) 05/26/2018   Transaminitis 05/26/2018   Elevated troponin 05/26/2018   Gait abnormality 07/23/2016   Essential hypertension 06/11/2016   Hypertensive urgency 06/11/2016   CAD (coronary artery disease) 06/11/2016   Pacemaker-[employer]tr[employer] 08/07/2011   Atrial fibrillation ([place]) 08/07/2011   Daytime somnolence 08/07/2011   HYPERTENSION, HEART CONTROLLED W/O ASSOC CHF 06/07/2010   AV BLOCK, COMPLETE 06/07/2010   Cerebral artery occlusion with cerebral infarction ([place]) 06/07/2010   PCP:  Philmore Pali, NP Pharmacy:   [id number] - [place], [address] [place] [address] Phone: [phone] Fax: [phone]     Social Determinants of Health (SDOH) Interventions    Readmission Risk Interventions No flowsheet data found.

## 2020-10-17 NOTE — Plan of Care (Signed)

## 2020-10-17 NOTE — Progress Notes (Signed)
[facility] Progress Note  Patient: [name]    ZOX:[id number]  DOA: 10/14/2020     Date of Service: the patient was seen and examined on 10/17/2020  Brief hospital course: Past medical history of HTN, HLD, CVA, GERD, gout, A. fib not on AC, CAD, CKD 3A, breast cancer right, squamous cell lung cancer PPM implant for CHB. Presented generalized weakness.  Found to have UTI and elevated troponin as well as tachyarrhythmia. Cardiology following for elevated troponin tachyarrhythmia. Currently on IV antibiotics for UTI. Also has chemotherapy-induced pancytopenia. Currently plan is monitor for improvement on current therapy.  Assessment and Plan: 1.  UTI, sepsis ruled out Recent positive culture for Proteus in 6/14. Outpatient treatment with Levaquin. Continues to have symptoms with fatigue and tiredness.  Brought to the hospital. Currently on IV cefazolin. Received IV fluids. Monitor cultures.  2.  Elevated troponin. Likely demand ischemia. [place] cardiology consultation. Not consistent with ACS. Outpatient further work-up.  3.  Tachyarrhythmia/paroxysmal A. fib. HTN. CHB SP PPM implant. Cardiology discussed with EP.   Holding amlodipine and HCTZ. Currently on Cardizem 120 mg Continue to monitor on telemetry. Remains off of the anticoagulation  4.  CVA history HLD Continue statin. Continue Plavix.  5.  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 Continue letrozole. On chemoradiation right now. Patient has chronic leukopenia.  Appears to have acute thrombocytopenia.  Last session of chemotherapy was on 6/14.  Discussed with oncology.  Will monitor.  6.  CKD 3A Hyperkalemia Hypercalcemia Renal function remained stable. Received Lokelma and IV fluids. Currently improving. Monitor.  Diet: Cardiac diet DVT Prophylaxis:   Place and maintain sequential  compression device Start: 10/16/20 0750    Advance goals of care discussion: Full code  Family Communication: no family was present at bedside, at the time of interview.   Disposition:  Status is: Inpatient  Remains inpatient appropriate because:Inpatient level of care appropriate due to severity of illness  Dispo: The patient is from: Home              Anticipated d/c is to: SNF family would like to take the patient home.  Likely discharge tomorrow pending stability.              Patient currently is not medically stable to d/c.   Difficult to place patient No  Subjective: No nausea no vomiting but no fever no chills.  No chest pain.  No abdominal pain.  Oral intake improving.  Still significantly weak requiring 2 person assistance.  Physical Exam:  General: Appear in mild distress, no Rash; Oral Mucosa Clear, moist. no Abnormal Neck Mass Or lumps, Conjunctiva normal  Cardiovascular: S1 and S2 Present, no Murmur, Respiratory: good respiratory effort, Bilateral Air entry present and faint basal crackles, no wheezes Abdomen: Bowel Sound present, Soft and no tenderness Extremities: bilateral  Pedal edema Neurology: alert and oriented to time, place, and person affect appropriate. no new focal deficit Gait not checked due to patient safety concerns  Vitals:   10/17/20 0842 10/17/20 1004 10/17/20 1143 10/17/20 1500  BP: 118/73  128/75 118/78  Pulse: 61  70 74  Resp:   18 17  Temp:   98.5 F (36.9 C) 98.4 F (36.9 C)  TempSrc:      SpO2: 100%  100% 100%  Height:  5\' 2"  (1.575 m)      Intake/Output Summary (Last 24 hours) at 10/17/2020 1743 Last data filed  at 10/17/2020 1706 Gross per 24 hour  Intake 1050.09 ml  Output 800 ml  Net 250.09 ml    There were no vitals filed for this visit.  Data Reviewed: I have personally reviewed and interpreted daily labs, tele strips, imaging. I reviewed all nursing notes, pharmacy notes, vitals, pertinent old records I have discussed  plan of care as described above with RN and patient/family.  CBC: Recent Labs  Lab 10/11/20 0901 10/14/20 2148 10/16/20 0602 10/17/20 0847  WBC 3.7* 3.8* 2.0* 2.3*  NEUTROABS 2.8 3.6  --  2.0  HGB 12.1 13.3 10.5* 9.9*  HCT 38.5 42.0 33.0* 30.2*  MCV 83.7 82.7 83.8 82.3  PLT 157 128* 94* 80*    Basic Metabolic Panel: Recent Labs  Lab 10/11/20 0901 10/14/20 2148 10/15/20 0233 10/15/20 1044 10/16/20 0602 10/17/20 0847  NA 134* 130*  --   --  135 133*  K 4.8 5.9* 5.8*  --  4.1 3.8  CL 99 99  --   --  104 103  CO2 25 21*  --   --  25 23  GLUCOSE 100* 141*  --   --  89 98  BUN 33* 53*  --   --  25* 19  CREATININE 0.91 1.05*  --   --  0.72 0.55  CALCIUM 10.1 10.4*  --   --  8.9 8.3*  MG  --   --   --  1.6* 1.9 1.5*     Studies: No results found.  Scheduled Meds:  allopurinol  100 mg Oral Daily   clopidogrel  75 mg Oral Daily   dexamethasone  4 mg Oral Daily   dextromethorphan-guaiFENesin  1 tablet Oral BID   diltiazem  120 mg Oral Daily   feeding supplement  237 mL Oral TID BM   latanoprost  1 drop Both Eyes QHS   letrozole  2.5 mg Oral Daily   multivitamin with minerals  1 tablet Oral Daily   pantoprazole  40 mg Oral Daily   simvastatin  40 mg Oral q1800   Continuous Infusions:   ceFAZolin (ANCEF) IV Stopped (10/17/20 1527)   PRN Meds: acetaminophen, hydrALAZINE, hydrOXYzine, lidocaine-prilocaine, prochlorperazine  Time spent: 35 minutes  Author: Berle Mull, MD [facility] 10/17/2020 5:43 PM  To reach On-call, see care teams to locate the attending and reach out via [email or web address]. Between 7PM-7AM, please contact night-coverage If you still have difficulty reaching the attending provider, please page the [phone] (Director on Call) for [facility] on amion for assistance.

## 2020-10-17 NOTE — Progress Notes (Signed)
Initial Nutrition Assessment  DOCUMENTATION CODES:   Severe malnutrition in context of chronic illness  INTERVENTION:   Ensure Enlive po TID, each supplement provides 350 kcal and 20 grams of protein  Magic cup TID with meals, each supplement provides 290 kcal and 9 grams of protein  MVI po daily   Liberalize diet   Pt at high refeed risk; recommend monitor potassium, magnesium and phosphorus labs daily until stable  NUTRITION DIAGNOSIS:   Severe Malnutrition related to cancer and cancer related treatments as evidenced by mild fat depletion, moderate fat depletion, moderate muscle depletion, severe muscle depletion, 16 percent weight loss in 2 months and 26% weight loss in 9 months.  GOAL:   Patient will meet greater than or equal to 90% of their needs  MONITOR:   PO intake, Supplement acceptance, Labs, Weight trends, Skin, I & O's  REASON FOR ASSESSMENT:   Malnutrition Screening Tool    ASSESSMENT:   87 y.o. female with medical history significant of s/p for pacemaker placement due to complete heart block, HTN, HLD, stroke, GERD, gout, A fib not on AC, CAD, c diff colitis, rhabdomyolysis, CKD-IIIa, R breast cancer (s/p for mastectomy 2020) and squamous cell lung cancer on chemo and radiation therapy who is admitted with UTI and sepsis  Met with pt in room today. Pt reports poor appetite and oral intake for several months pta. Pt reports that her appetite has improved in hospital; pt is documented to be eating 25-75% of her meals. Pt ate 75% of her breakfast and lunch today. Pt reports that she does drink Ensure at home but not regularly. Pt is willing to drink chocolate Ensure in hospital. RD will add supplements and MVI to help pt meet her estimated needs. RD will also liberalize pt's diet. RD discussed with pt ways to increase her calorie and protein intake at home and recommended that pt find a supplement she enjoys. Discussed the importance of adequate nutrition needed to  preserve lean muscle. Per chart, pt is down 45lbs(26%) over the past 9 months and is down 24lbs(16%) over the past two months; this is significant weight loss. Pt's UBW is ~175-180lbs but pt reports that at one point she weighed over 200lbs. Pt is followed by the dietitian at the cancer center. Pt has been on megace recently but feels as though this did not help her appetite.   Medications reviewed and include: allopurinol, plavix, dexamethasone, protonix, cefazolin  Labs reviewed: Na 133(L), Mg 1.5(L) Wbc- 2.3(L), Hgb 9.9(L), Hct 30.2(L)  NUTRITION - FOCUSED PHYSICAL EXAM:  Flowsheet Row Most Recent Value  Orbital Region No depletion  Upper Arm Region Moderate depletion  Thoracic and Lumbar Region Mild depletion  Buccal Region No depletion  Temple Region Mild depletion  Clavicle Bone Region Mild depletion  Clavicle and Acromion Bone Region Mild depletion  Scapular Bone Region Moderate depletion  Dorsal Hand Severe depletion  Patellar Region Severe depletion  Anterior Thigh Region Severe depletion  Posterior Calf Region Severe depletion  Edema (RD Assessment) None  Hair Reviewed  Eyes Reviewed  Mouth Reviewed  Skin Reviewed  Nails Reviewed      Diet Order:   Diet Order             Diet Heart Room service appropriate? Yes; Fluid consistency: Thin  Diet effective now                  EDUCATION NEEDS:   Education needs have been addressed  Skin:  Skin Assessment:  Reviewed RN Assessment  Last BM:  pta  Height:   Ht Readings from Last 1 Encounters:  10/17/20 5' 2"  (1.575 m)    Weight:   Wt Readings from Last 1 Encounters:  10/11/20 59.4 kg    Ideal Body Weight:  50 kg  BMI:  Body mass index is 23.96 kg/m.  Estimated Nutritional Needs:   Kcal:  1500-1700kcal/day  Protein:  75-85g/day  Fluid:  1.3-1.5L/day  Koleen Distance MS, RD, LDN Please refer to [facility] for RD and/or RD on-call/weekend/after hours pager

## 2020-10-17 NOTE — NC FL2 (Signed)
[place] LEVEL OF CARE SCREENING TOOL     IDENTIFICATION  Patient Name: [name] Birthdate: February 21, 1934 Sex: female Admission Date (Current Location): 10/14/2020  [facility] and [place] Number:  Engineering geologist and Address:  [facility], [address], [place],  [address]      Provider Number: [id number]  Attending Physician Name and Address:  Lavina Hamman, MD  Relative Name and Phone Number:  Vivien Rota (sister) [phone]    Current Level of Care: Hospital Recommended Level of Care: [place] Prior Approval Number:    Date Approved/Denied:   PASRR Number: [id number]  Discharge Plan: SNF    Current Diagnoses: Patient Active Problem List   Diagnosis Date Noted   Weakness 10/15/2020   UTI (urinary tract infection) 10/15/2020   Fall at home, initial encounter 10/15/2020   CKD (chronic kidney disease), stage IIIa 10/15/2020   Hyperkalemia 10/15/2020   Hypercalcemia 10/15/2020   Severe sepsis ([place]) 10/15/2020   Squamous cell carcinoma of right lung ([place]) 08/05/2020   Hemoptysis 07/25/2020   Malignant neoplasm of upper-outer quadrant of right breast in female, estrogen receptor positive ([place]) 10/31/2018   Enteritis due to Clostridium difficile    Malnutrition of moderate degree 07/15/2018   Cough    Ileus (HCC)    Low blood magnesium 07/13/2018   Vomiting 07/13/2018   Loss of weight 06/27/2018   Rhabdomyolysis 05/26/2018   History of CVA (cerebrovascular accident) 05/26/2018   Hyperlipidemia 05/26/2018   AKI (acute kidney injury) ([place]) 05/26/2018   Transaminitis 05/26/2018   Elevated troponin 05/26/2018   Gait abnormality 07/23/2016   Essential hypertension 06/11/2016   Hypertensive urgency 06/11/2016   CAD (coronary artery disease) 06/11/2016   Pacemaker-Medtronic 08/07/2011   Atrial fibrillation ([place]) 08/07/2011   Daytime somnolence 08/07/2011   HYPERTENSION, HEART CONTROLLED W/O ASSOC  CHF 06/07/2010   AV BLOCK, COMPLETE 06/07/2010   Cerebral artery occlusion with cerebral infarction ([place]) 06/07/2010    Orientation RESPIRATION BLADDER Height & Weight     Self, Time, Situation, Place  Normal Continent, External catheter Weight:   Height:  5\' 2"  (157.5 cm)  BEHAVIORAL SYMPTOMS/MOOD NEUROLOGICAL BOWEL NUTRITION STATUS      Continent Diet (see discharge summary)  AMBULATORY STATUS COMMUNICATION OF NEEDS Skin   Limited Assist Verbally Normal                       Personal Care Assistance Level of Assistance  Bathing, Feeding, Dressing, Total care Bathing Assistance: Limited assistance Feeding assistance: Independent Dressing Assistance: Limited assistance Total Care Assistance: Limited assistance   Functional Limitations Info  Hearing, Sight, Speech Sight Info: Adequate Hearing Info: Adequate Speech Info: Adequate    SPECIAL CARE FACTORS FREQUENCY  PT (By licensed PT), OT (By licensed OT)     PT Frequency: min 4x weekly OT Frequency: min 4x weekly            Contractures Contractures Info: Not present    Additional Factors Info  Code Status, Allergies Code Status Info: full Allergies Info: No Known allergies           Current Medications (10/17/2020):  This is the current hospital active medication list Current Facility-Administered Medications  Medication Dose Route Frequency Provider Last Rate Last Admin   acetaminophen (TYLENOL) tablet 650 mg  650 mg Oral Q6H PRN Ivor Costa, MD       allopurinol (ZYLOPRIM) tablet 100 mg  100 mg Oral Daily Ivor Costa,  MD   100 mg at 10/17/20 0927   ceFAZolin (ANCEF) IVPB 1 g/50 mL premix  1 g Intravenous Q8H Ivor Costa, MD 100 mL/hr at 10/17/20 0550 1 g at 10/17/20 0550   clopidogrel (PLAVIX) tablet 75 mg  75 mg Oral Daily Ivor Costa, MD   75 mg at 10/17/20 1448   dexamethasone (DECADRON) tablet 4 mg  4 mg Oral Daily Ivor Costa, MD   4 mg at 10/17/20 1856   dextromethorphan-guaiFENesin ([place] DM) 30-600  MG per 12 hr tablet 1 tablet  1 tablet Oral BID Lavina Hamman, MD   1 tablet at 10/17/20 3149   diltiazem (CARDIZEM CD) 24 hr capsule 120 mg  120 mg Oral Daily Theora Gianotti, NP   120 mg at 10/17/20 7026   hydrALAZINE (APRESOLINE) injection 5 mg  5 mg Intravenous Q2H PRN Ivor Costa, MD       hydrOXYzine (ATARAX/VISTARIL) tablet 25 mg  25 mg Oral TID PRN Ivor Costa, MD       latanoprost (XALATAN) 0.005 % ophthalmic solution 1 drop  1 drop Both Eyes QHS Ivor Costa, MD   1 drop at 10/16/20 2333   letrozole [facility]) tablet 2.5 mg  2.5 mg Oral Daily Ivor Costa, MD   2.5 mg at 10/16/20 2332   lidocaine-prilocaine (EMLA) cream   Topical Daily PRN Ivor Costa, MD       magnesium sulfate IVPB 2 g 50 mL  2 g Intravenous Once Lavina Hamman, MD       pantoprazole (PROTONIX) EC tablet 40 mg  40 mg Oral Daily Ivor Costa, MD   40 mg at 10/17/20 3785   prochlorperazine (COMPAZINE) tablet 10 mg  10 mg Oral Q6H PRN Ivor Costa, MD       simvastatin (ZOCOR) tablet 40 mg  40 mg Oral q1800 Ivor Costa, MD   40 mg at 10/16/20 1820   Facility-Administered Medications Ordered in Other Encounters  Medication Dose Route Frequency Provider Last Rate Last Admin   sodium chloride flush (NS) 0.9 % injection 10 mL  10 mL Intravenous PRN [name], MD   10 mL at 09/13/20 8850     Discharge Medications: Please see discharge summary for a list of discharge medications.  Relevant Imaging Results:  Relevant Lab Results:   Additional Information SSN: [id number]  Alberteen Sam, LCSW

## 2020-10-18 ENCOUNTER — Inpatient Hospital Stay: Payer: Medicare HMO

## 2020-10-18 ENCOUNTER — Ambulatory Visit: Payer: Medicare HMO

## 2020-10-18 ENCOUNTER — Inpatient Hospital Stay: Payer: Medicare HMO | Admitting: Oncology

## 2020-10-18 LAB — CBC WITH DIFFERENTIAL/PLATELET
Abs Immature Granulocytes: 0.03 10*3/uL (ref 0.00–0.07)
Basophils Absolute: 0 10*3/uL (ref 0.0–0.1)
Basophils Relative: 0 %
Eosinophils Absolute: 0 10*3/uL (ref 0.0–0.5)
Eosinophils Relative: 0 %
HCT: 32.9 % — ABNORMAL LOW (ref 36.0–46.0)
Hemoglobin: 10.6 g/dL — ABNORMAL LOW (ref 12.0–15.0)
Immature Granulocytes: 1 %
Lymphocytes Relative: 8 %
Lymphs Abs: 0.2 10*3/uL — ABNORMAL LOW (ref 0.7–4.0)
MCH: 26.4 pg (ref 26.0–34.0)
MCHC: 32.2 g/dL (ref 30.0–36.0)
MCV: 81.8 fL (ref 80.0–100.0)
Monocytes Absolute: 0.1 10*3/uL (ref 0.1–1.0)
Monocytes Relative: 2 %
Neutro Abs: 2.2 10*3/uL (ref 1.7–7.7)
Neutrophils Relative %: 89 %
Platelets: 76 10*3/uL — ABNORMAL LOW (ref 150–400)
RBC: 4.02 MIL/uL (ref 3.87–5.11)
RDW: 17.7 % — ABNORMAL HIGH (ref 11.5–15.5)
WBC: 2.5 10*3/uL — ABNORMAL LOW (ref 4.0–10.5)
nRBC: 0 % (ref 0.0–0.2)

## 2020-10-18 MED ORDER — CEPHALEXIN 500 MG PO CAPS
500.0000 mg | ORAL_CAPSULE | Freq: Three times a day (TID) | ORAL | 0 refills | Status: DC
Start: 2020-10-18 — End: 2020-10-22

## 2020-10-18 MED ORDER — ENSURE ENLIVE PO LIQD: 237.0000 mL | Freq: Three times a day (TID) | ORAL | 0 refills | Status: AC

## 2020-10-18 MED ORDER — DILTIAZEM HCL ER COATED BEADS 120 MG PO CP24
120.0000 mg | ORAL_CAPSULE | Freq: Every day | ORAL | 0 refills | Status: DC
Start: 2020-10-19 — End: 2020-12-29

## 2020-10-18 MED ORDER — CEPHALEXIN 500 MG PO CAPS
500.0000 mg | ORAL_CAPSULE | Freq: Three times a day (TID) | ORAL | Status: AC
  Administered 2020-10-18 – 2020-10-21 (×11): 500 mg via ORAL
  Filled 2020-10-18 (×11): qty 1

## 2020-10-18 MED ORDER — ATORVASTATIN CALCIUM 20 MG PO TABS: 20.0000 mg | ORAL_TABLET | Freq: Every day | ORAL | 0 refills | Status: DC

## 2020-10-18 MED ORDER — DM-GUAIFENESIN ER 30-600 MG PO TB12: 1.0000 | ORAL_TABLET | Freq: Two times a day (BID) | ORAL | 0 refills | Status: DC

## 2020-10-18 MED ORDER — ADULT MULTIVITAMIN W/MINERALS CH: 1.0000 | ORAL_TABLET | Freq: Every day | ORAL | 0 refills | Status: DC
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
## 2020-10-18 NOTE — Care Management Important Message (Signed)
Important Message  Patient Details  Name: [name] MRN: [id number] Date of Birth: [birth year]   Medicare Important Message Given:  Yes     Dannette Barbara 10/18/2020, 11:47 AM

## 2020-10-18 NOTE — Plan of Care (Signed)
Problem: Education: Goal: Knowledge of General Education information will improve Description: Including pain rating scale, medication(s)/side effects and non-pharmacologic comfort measures 10/18/2020 1217 by Cristela Blue, RN Outcome: Progressing 10/18/2020 1217 by Cristela Blue, RN Outcome: Progressing   Problem: Health Behavior/Discharge Planning: Goal: Ability to manage health-related needs will improve 10/18/2020 1217 by Cristela Blue, RN Outcome: Progressing 10/18/2020 1217 by Cristela Blue, RN Outcome: Progressing   Problem: Clinical Measurements: Goal: Ability to maintain clinical measurements within normal limits will improve 10/18/2020 1217 by Cristela Blue, RN Outcome: Progressing 10/18/2020 1217 by Cristela Blue, RN Outcome: Progressing Goal: Will remain free from infection 10/18/2020 1217 by Cristela Blue, RN Outcome: Progressing 10/18/2020 1217 by Cristela Blue, RN Outcome: Progressing Goal: Diagnostic test results will improve 10/18/2020 1217 by Cristela Blue, RN Outcome: Progressing 10/18/2020 1217 by Cristela Blue, RN Outcome: Progressing Goal: Respiratory complications will improve 10/18/2020 1217 by Cristela Blue, RN Outcome: Progressing 10/18/2020 1217 by Cristela Blue, RN Outcome: Progressing Goal: Cardiovascular complication will be avoided 10/18/2020 1217 by Cristela Blue, RN Outcome: Progressing 10/18/2020 1217 by Cristela Blue, RN Outcome: Progressing   Problem: Activity: Goal: Risk for activity intolerance will decrease 10/18/2020 1217 by Cristela Blue, RN Outcome: Progressing 10/18/2020 1217 by Cristela Blue, RN Outcome: Progressing   Problem: Nutrition: Goal: Adequate nutrition will be maintained 10/18/2020 1217 by Cristela Blue, RN Outcome: Progressing 10/18/2020 1217 by Cristela Blue, RN Outcome: Progressing   Problem: Coping: Goal: Level of anxiety will decrease 10/18/2020 1217 by Cristela Blue, RN Outcome:  Progressing 10/18/2020 1217 by Cristela Blue, RN Outcome: Progressing   Problem: Elimination: Goal: Will not experience complications related to bowel motility 10/18/2020 1217 by Cristela Blue, RN Outcome: Progressing 10/18/2020 1217 by Cristela Blue, RN Outcome: Progressing Goal: Will not experience complications related to urinary retention 10/18/2020 1217 by Cristela Blue, RN Outcome: Progressing 10/18/2020 1217 by Cristela Blue, RN Outcome: Progressing   Problem: Pain Managment: Goal: General experience of comfort will improve 10/18/2020 1217 by Cristela Blue, RN Outcome: Progressing 10/18/2020 1217 by Cristela Blue, RN Outcome: Progressing   Problem: Safety: Goal: Ability to remain free from injury will improve 10/18/2020 1217 by Cristela Blue, RN Outcome: Progressing 10/18/2020 1217 by Cristela Blue, RN Outcome: Progressing   Problem: Skin Integrity: Goal: Risk for impaired skin integrity will decrease 10/18/2020 1217 by Cristela Blue, RN Outcome: Progressing 10/18/2020 1217 by Cristela Blue, RN Outcome: Progressing   Problem: Education: Goal: Knowledge of General Education information will improve Description: Including pain rating scale, medication(s)/side effects and non-pharmacologic comfort measures 10/18/2020 1217 by Cristela Blue, RN Outcome: Progressing 10/18/2020 1217 by Cristela Blue, RN Outcome: Progressing   Problem: Health Behavior/Discharge Planning: Goal: Ability to manage health-related needs will improve 10/18/2020 1217 by Cristela Blue, RN Outcome: Progressing 10/18/2020 1217 by Cristela Blue, RN Outcome: Progressing   Problem: Clinical Measurements: Goal: Ability to maintain clinical measurements within normal limits will improve 10/18/2020 1217 by Cristela Blue, RN Outcome: Progressing 10/18/2020 1217 by Cristela Blue, RN Outcome: Progressing Goal: Will remain free from infection 10/18/2020 1217 by Cristela Blue, RN Outcome:  Progressing 10/18/2020 1217 by Cristela Blue, RN Outcome: Progressing Goal: Diagnostic test results will improve 10/18/2020 1217 by Cristela Blue, RN Outcome: Progressing 10/18/2020 1217 by Cristela Blue, RN Outcome: Progressing Goal: Respiratory complications will improve 10/18/2020 1217 by Cristela Blue, RN Outcome: Progressing 10/18/2020 1217 by Cristela Blue, RN Outcome: Progressing Goal: Cardiovascular complication will be avoided 10/18/2020 1217 by Cristela Blue, RN Outcome: Progressing 10/18/2020 1217 by Cristela Blue, RN Outcome: Progressing   Problem: Activity: Goal:  Risk for activity intolerance will decrease 10/18/2020 1217 by Cristela Blue, RN Outcome: Progressing 10/18/2020 1217 by Cristela Blue, RN Outcome: Progressing   Problem: Nutrition: Goal: Adequate nutrition will be maintained 10/18/2020 1217 by Cristela Blue, RN Outcome: Progressing 10/18/2020 1217 by Cristela Blue, RN Outcome: Progressing   Problem: Coping: Goal: Level of anxiety will decrease 10/18/2020 1217 by Cristela Blue, RN Outcome: Progressing 10/18/2020 1217 by Cristela Blue, RN Outcome: Progressing   Problem: Elimination: Goal: Will not experience complications related to bowel motility 10/18/2020 1217 by Cristela Blue, RN Outcome: Progressing 10/18/2020 1217 by Cristela Blue, RN Outcome: Progressing Goal: Will not experience complications related to urinary retention 10/18/2020 1217 by Cristela Blue, RN Outcome: Progressing 10/18/2020 1217 by Cristela Blue, RN Outcome: Progressing   Problem: Pain Managment: Goal: General experience of comfort will improve 10/18/2020 1217 by Cristela Blue, RN Outcome: Progressing 10/18/2020 1217 by Cristela Blue, RN Outcome: Progressing   Problem: Safety: Goal: Ability to remain free from injury will improve 10/18/2020 1217 by Cristela Blue, RN Outcome: Progressing 10/18/2020 1217 by Cristela Blue, RN Outcome: Progressing   Problem:  Skin Integrity: Goal: Risk for impaired skin integrity will decrease 10/18/2020 1217 by Cristela Blue, RN Outcome: Progressing 10/18/2020 1217 by Cristela Blue, RN Outcome: Progressing

## 2020-10-18 NOTE — Progress Notes (Signed)
[facility] Progress Note  Patient: [name]    LPF:[id number]  DOA: 10/14/2020     Date of Service: the patient was seen and examined on 10/18/2020  Brief hospital course: Past medical history of HTN, HLD, CVA, GERD, gout, A. fib not on AC, CAD, CKD 3A, breast cancer right, squamous cell lung cancer PPM implant for CHB. Presented generalized weakness.  Found to have UTI and elevated troponin as well as tachyarrhythmia. Cardiology following for elevated troponin tachyarrhythmia. Currently on IV antibiotics for UTI. Also has chemotherapy-induced pancytopenia. Currently plan is arrange for safe discharge.  Medically stable.  Assessment and Plan: 1.  UTI, sepsis ruled out Recent positive culture for Proteus in 6/14. Outpatient treatment with Levaquin. Continues to have symptoms with fatigue and tiredness.  Brought to the hospital. Currently on IV cefazolin.  Switch to oral Keflex for 3 more days. Received IV fluids. So far no growth on the cultures.  2.  Elevated troponin. Likely demand ischemia. [place] cardiology consultation. Not consistent with ACS. Outpatient further work-up.  3.  Tachyarrhythmia/paroxysmal A. fib. HTN. CHB SP PPM implant. Cardiology discussed with EP.   Holding amlodipine and HCTZ. Currently on Cardizem 120 mg Continue to monitor on telemetry. Remains off of the anticoagulation  4.  CVA history HLD Continue statin. Continue Plavix.  5.  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 Continue letrozole. On chemoradiation right now. Patient has chronic leukopenia.  Appears to have acute thrombocytopenia.  Last session of chemotherapy was on 6/14.  Discussed with oncology.  Will monitor.  Now appears to be stable.  6.  CKD 3A Hyperkalemia Hypercalcemia Renal function remained stable. Received Lokelma and IV fluids. Currently  improving. Monitor.  Diet: Cardiac diet DVT Prophylaxis:   Place and maintain sequential compression device Start: 10/16/20 0750    Advance goals of care discussion: Full code  Family Communication: family was present at bedside, at the time of interview.   Disposition:  Status is: Inpatient  Remains inpatient appropriate because: Unsafe discharge plan  Dispo: The patient is from: Home              Anticipated d/c is to: SNF               Patient currently medically stable for discharge.   Difficult to place patient No  Subjective: No nausea no vomiting.  No fever no chills.  Feeling stronger.  No chest pain.  Abdominal pain.  Physical Exam:  General: Appear in mild distress, no Rash; Oral Mucosa Clear, moist. no Abnormal Neck Mass Or lumps, Conjunctiva normal  Cardiovascular: S1 and S2 Present, no Murmur, Respiratory: good respiratory effort, Bilateral Air entry present and CTA, no Crackles, no wheezes Abdomen: Bowel Sound present, Soft and no tenderness Extremities: no Pedal edema Neurology: alert and oriented to time, place, and person affect appropriate. no new focal deficit Gait not checked due to patient safety concerns  Vitals:   10/18/20 0745 10/18/20 1101 10/18/20 1209 10/18/20 1431  BP: 120/72 138/72 123/76 126/72  Pulse: 62 63 62 64  Resp: 19 18 18 18   Temp: 98.2 F (36.8 C) 98.1 F (36.7 C) 98 F (36.7 C) 98.3 F (36.8 C)  TempSrc: Oral Oral  Oral  SpO2: 100% 100% 100% 100%  Weight:      Height:        Intake/Output Summary (Last 24 hours) at 10/18/2020 1850 Last data filed at 10/18/2020 1700  Gross per 24 hour  Intake 390 ml  Output 275 ml  Net 115 ml    Filed Weights   10/17/20 1941  Weight: 63 kg    Data Reviewed: I have personally reviewed and interpreted daily labs, tele strips, imaging. I reviewed all nursing notes, pharmacy notes, vitals, pertinent old records I have discussed plan of care as described above with RN and  patient/family.  CBC: Recent Labs  Lab 10/14/20 2148 10/16/20 0602 10/17/20 0847 10/18/20 0828  WBC 3.8* 2.0* 2.3* 2.5*  NEUTROABS 3.6  --  2.0 2.2  HGB 13.3 10.5* 9.9* 10.6*  HCT 42.0 33.0* 30.2* 32.9*  MCV 82.7 83.8 82.3 81.8  PLT 128* 94* 80* 76*    Basic Metabolic Panel: Recent Labs  Lab 10/14/20 2148 10/15/20 0233 10/15/20 1044 10/16/20 0602 10/17/20 0847  NA 130*  --   --  135 133*  K 5.9* 5.8*  --  4.1 3.8  CL 99  --   --  104 103  CO2 21*  --   --  25 23  GLUCOSE 141*  --   --  89 98  BUN 53*  --   --  25* 19  CREATININE 1.05*  --   --  0.72 0.55  CALCIUM 10.4*  --   --  8.9 8.3*  MG  --   --  1.6* 1.9 1.5*     Studies: No results found.  Scheduled Meds:  allopurinol  100 mg Oral Daily   cephALEXin  500 mg Oral Q8H   clopidogrel  75 mg Oral Daily   dexamethasone  4 mg Oral Daily   dextromethorphan-guaiFENesin  1 tablet Oral BID   diltiazem  120 mg Oral Daily   feeding supplement  237 mL Oral TID BM   latanoprost  1 drop Both Eyes QHS   letrozole  2.5 mg Oral Daily   multivitamin with minerals  1 tablet Oral Daily   pantoprazole  40 mg Oral Daily   simvastatin  40 mg Oral q1800   Continuous Infusions:   PRN Meds: acetaminophen, hydrALAZINE, hydrOXYzine, lidocaine-prilocaine, prochlorperazine  Time spent: 35 minutes  Author: Berle Mull, MD [facility] 10/18/2020 6:50 PM  To reach On-call, see care teams to locate the attending and reach out via [email or web address]. Between 7PM-7AM, please contact night-coverage If you still have difficulty reaching the attending provider, please page the [facility] (Director on Call) for [facility] on amion for assistance.

## 2020-10-19 ENCOUNTER — Ambulatory Visit: Payer: Medicare HMO

## 2020-10-19 LAB — CBC WITH DIFFERENTIAL/PLATELET
Abs Immature Granulocytes: 0.02 10*3/uL (ref 0.00–0.07)
Basophils Absolute: 0 10*3/uL (ref 0.0–0.1)
Basophils Relative: 0 %
Eosinophils Absolute: 0 10*3/uL (ref 0.0–0.5)
Eosinophils Relative: 0 %
HCT: 30.3 % — ABNORMAL LOW (ref 36.0–46.0)
Hemoglobin: 9.9 g/dL — ABNORMAL LOW (ref 12.0–15.0)
Immature Granulocytes: 1 %
Lymphocytes Relative: 6 %
Lymphs Abs: 0.2 10*3/uL — ABNORMAL LOW (ref 0.7–4.0)
MCH: 26.8 pg (ref 26.0–34.0)
MCHC: 32.7 g/dL (ref 30.0–36.0)
MCV: 81.9 fL (ref 80.0–100.0)
Monocytes Absolute: 0.1 10*3/uL (ref 0.1–1.0)
Monocytes Relative: 3 %
Neutro Abs: 2.6 10*3/uL (ref 1.7–7.7)
Neutrophils Relative %: 90 %
Platelets: 73 10*3/uL — ABNORMAL LOW (ref 150–400)
RBC: 3.7 MIL/uL — ABNORMAL LOW (ref 3.87–5.11)
RDW: 17.7 % — ABNORMAL HIGH (ref 11.5–15.5)
WBC: 2.9 10*3/uL — ABNORMAL LOW (ref 4.0–10.5)
nRBC: 1.4 % — ABNORMAL HIGH (ref 0.0–0.2)

## 2020-10-19 NOTE — Progress Notes (Signed)
[facility] Progress Note  Patient: [name]    ZOX:[id number]  DOA: 10/14/2020     Date of Service: the patient was seen and examined on 10/19/2020  Chief Complaint  Patient presents with   Weakness    Pt arrives to ED from home via EMS c/o generalized weakness x 1 week. Hx of lung ca, actively beng treated. Pt states she fell at home yesterday, abrasion noted on L knee. Pt denies pain at this time. A/O x 4.   Brief hospital course: Past medical history of HTN, HLD, CVA, GERD, gout, A. fib not on AC, CAD, CKD 3A, breast cancer right, squamous cell lung cancer PPM implant for CHB. Presented generalized weakness.  Found to have UTI and elevated troponin as well as tachyarrhythmia. Cardiology following for elevated troponin tachyarrhythmia. Currently on IV antibiotics for UTI. Also has chemotherapy-induced pancytopenia. Currently plan is arrange for safe discharge.  Medically stable.   Assessment and Plan: 1.  UTI, sepsis ruled out Recent positive culture for Proteus in 6/14. Outpatient treatment with Levaquin. Continues to have symptoms with fatigue and tiredness.  Brought to the hospital. Currently on IV cefazolin.  Switch to oral Keflex for 3 more days. Received IV fluids. So far no growth on the cultures.   2.  Elevated troponin. Likely demand ischemia. [place] cardiology consultation. Not consistent with ACS. Outpatient further work-up.   3.  Tachyarrhythmia/paroxysmal A. fib. HTN. CHB SP PPM implant. Cardiology discussed with EP.   Holding amlodipine and HCTZ. Currently on Cardizem 120 mg Continue to monitor on telemetry. Remains off of the anticoagulation   4.  CVA history HLD Continue statin. Continue Plavix.   5.  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 Continue letrozole. On chemoradiation right now. Patient has chronic leukopenia.  Appears  to have acute thrombocytopenia.  Last session of chemotherapy was on 6/14.  Discussed with oncology.  Will monitor.  Now appears to be stable.   6.  CKD 3A Hyperkalemia Hypercalcemia Renal function remained stable. Received Lokelma and IV fluids. Currently improving. Monitor.  Body mass index is 25.4 kg/m.  Nutrition Problem: Severe Malnutrition Etiology: cancer and cancer related treatments Interventions:        Diet: Cardiac diet DVT Prophylaxis: SCDs  Advance goals of care discussion: Full code  Family Communication: family was not present at bedside, at the time of interview.  The pt provided permission to discuss medical plan with the family. Opportunity was given to ask question and all questions were answered satisfactorily.   Disposition:  Pt is from Home, admitted with SNF, medically stable to discharge to SNF when we will receive insurance authorization.   Subjective: No overnight issues, patient denies any chest pain or palpitations, no any abdominal pain, no shortness of breath.  Physical Exam: General:  alert, NAD, laying in bed comfortably.  Appear in no distress, affect appropriate Eyes: PERRLA ENT: Oral Mucosa Clear, moist  Neck: no JVD,  Cardiovascular: S1 and S2 Present, no Murmur,  Respiratory: good respiratory effort, Bilateral Air entry equal and Decreased, no Crackles, no wheezes Abdomen: Bowel Sound present, Soft and no tenderness,  Skin: no rashes Extremities: no Pedal edema, no calf tenderness Neurologic: without any new focal findings Gait not checked due to patient safety concerns  Vitals:   10/18/20 1431 10/19/20 0120 10/19/20 0506 10/19/20 0849  BP: 126/72 112/74 107/72 122/77  Pulse: 64 65 62 63  Resp: 18  20 19  Temp: 98.3 F (36.8 C) 97.6 F (36.4 C) 98.1 F (36.7 C) 97.9 F (36.6 C)  TempSrc: Oral Oral Oral Oral  SpO2: 100%  100% 100%  Weight:      Height:        Intake/Output Summary (Last 24 hours) at 10/19/2020  1428 Last data filed at 10/19/2020 1354 Gross per 24 hour  Intake 820 ml  Output 500 ml  Net 320 ml   Filed Weights   10/17/20 1941  Weight: 63 kg    Data Reviewed: I have personally reviewed and interpreted daily labs, tele strips, imagings as discussed above. I reviewed all nursing notes, pharmacy notes, vitals, pertinent old records I have discussed plan of care as described above with RN and patient/family.  CBC: Recent Labs  Lab 10/14/20 2148 10/16/20 0602 10/17/20 0847 10/18/20 0828 10/19/20 0445  WBC 3.8* 2.0* 2.3* 2.5* 2.9*  NEUTROABS 3.6  --  2.0 2.2 2.6  HGB 13.3 10.5* 9.9* 10.6* 9.9*  HCT 42.0 33.0* 30.2* 32.9* 30.3*  MCV 82.7 83.8 82.3 81.8 81.9  PLT 128* 94* 80* 76* 73*   Basic Metabolic Panel: Recent Labs  Lab 10/14/20 2148 10/15/20 0233 10/15/20 1044 10/16/20 0602 10/17/20 0847  NA 130*  --   --  135 133*  K 5.9* 5.8*  --  4.1 3.8  CL 99  --   --  104 103  CO2 21*  --   --  25 23  GLUCOSE 141*  --   --  89 98  BUN 53*  --   --  25* 19  CREATININE 1.05*  --   --  0.72 0.55  CALCIUM 10.4*  --   --  8.9 8.3*  MG  --   --  1.6* 1.9 1.5*    Studies: No results found.  Scheduled Meds:  allopurinol  100 mg Oral Daily   cephALEXin  500 mg Oral Q8H   clopidogrel  75 mg Oral Daily   dexamethasone  4 mg Oral Daily   dextromethorphan-guaiFENesin  1 tablet Oral BID   diltiazem  120 mg Oral Daily   feeding supplement  237 mL Oral TID BM   latanoprost  1 drop Both Eyes QHS   letrozole  2.5 mg Oral Daily   multivitamin with minerals  1 tablet Oral Daily   pantoprazole  40 mg Oral Daily   simvastatin  40 mg Oral q1800   Continuous Infusions: PRN Meds: acetaminophen, hydrALAZINE, hydrOXYzine, lidocaine-prilocaine, prochlorperazine  Time spent: 35 minutes  Author: Val Riles. MD [facility] 10/19/2020 2:28 PM  To reach On-call, see care teams to locate the attending and reach out to them via [email or web address]. If 7PM-7AM, please contact  night-coverage If you still have difficulty reaching the attending provider, please page the [facility] (Director on Call) for [facility] on amion for assistance.

## 2020-10-19 NOTE — Progress Notes (Signed)
Occupational Therapy Treatment Patient Details Name: [name] MRN: [id number] DOB: [birth year] Today's Date: 10/19/2020    History of present illness Pt is a 87 y.o. female with medical history significant of s/p for pacemaker placement due to complete heart block, HTN, HLD, stroke, GERD, gout, A fib not on AC, CAD, c diff colitis, rhabdomyolysis, CKD-IIIa, R breast cancer (s/p for mastectomy 2020), squamous cell lung cancer on chemo and radiation therapy, UTI, who presents to ED on 10/14/20 with weakness, dysuria and fall. Pt with positive UTI on 6/14, started Levaquin on 6/16.   OT comments  Ms Brotherton was seen for OT treatment on this date. Upon arrival to room pt reclined in bed, agreeable to OOB to chair for morning ADLs. Pt requires MIN A exit R side of bed, completed seated therex as described below. Pt reports she is fearful of falling, declines going to bathroom to urinate but agreeable to [facility]. CGA + RW for BSC t/f and perihygiene. SETUP seated grooming ADLs. MIN A + RW don underwear - assist to pull over rear. MIN A don/doff gown in standing - assist for balance, postural sway noted with static and dynamic standing. Pt continues to require assistance for OOB activities and reports living alone, STR continues to be most appropriate discharge recommendation.  Pt making good progress toward goals. Pt continues to benefit from skilled OT services to maximize return to PLOF and minimize risk of future falls, injury, caregiver burden, and readmission. Will continue to follow POC.    Follow Up Recommendations  SNF    Equipment Recommendations  None recommended by OT    Recommendations for Other Services      Precautions / Restrictions Precautions Precautions: Fall Restrictions Weight Bearing Restrictions: No       Mobility Bed Mobility Overal bed mobility: Needs Assistance Bed Mobility: Supine to Sit     Supine to sit: Min assist          Transfers Overall transfer  level: Needs assistance Equipment used: Rolling walker (2 wheeled) Transfers: Sit to/from Stand Sit to Stand: Min assist         General transfer comment: assist to stabilize RW    Balance Overall balance assessment: Needs assistance Sitting-balance support: Bilateral upper extremity supported;Feet supported Sitting balance-Leahy Scale: Fair     Standing balance support: No upper extremity supported;During functional activity Standing balance-Leahy Scale: Fair                             ADL either performed or assessed with clinical judgement   ADL Overall ADL's : Needs assistance/impaired                                       General ADL Comments: MIN A + RW don underwear - assist to pull over rear. MIN A don/doff gown in standing - assist for balance, postural sway noted with static and dynamic standing. CGA + RW for BSC t/f and perihygiene. SETUP seated grooming ADLs      Cognition Arousal/Alertness: Awake/alert Behavior During Therapy: WFL for tasks assessed/performed Overall Cognitive Status: Within Functional Limits for tasks assessed                                 General Comments: reports fear of falling however  agreeable to [facility] t/f and ~26ft mobility trial        Exercises Exercises: Other exercises Other Exercises Other Exercises: Pt educated re: d/c recs, falls prevention, ECS, importance of mobility for functional strengthening, call bell education for NT assist to [facility] Other Exercises: LBD, UBD, grooming, toileting, sup>sit, sit<>stand, sitting/standing balance/tolerance, ~64ft moblity, 1 set x 10 reps each seated marches/long arc quad and standing marches           Pertinent Vitals/ Pain       Pain Assessment: No/denies pain         Frequency  Min 1X/week        Progress Toward Goals  OT Goals(current goals can now be found in the care plan section)  Progress towards OT goals: Progressing toward  goals  Acute Rehab OT Goals Patient Stated Goal: to not fall again OT Goal Formulation: With patient Time For Goal Achievement: 10/30/20 Potential to Achieve Goals: Good ADL Goals Pt Will Perform Grooming: with modified independence;standing Pt Will Transfer to Toilet: stand pivot transfer;regular height toilet;with modified independence Pt/caregiver will Perform Home Exercise Program: Increased ROM;Increased strength;Independently;With written HEP provided  Plan Discharge plan remains appropriate;Frequency remains appropriate       AM-PAC OT "6 Clicks" Daily Activity     Outcome Measure   Help from another person eating meals?: None Help from another person taking care of personal grooming?: A Little Help from another person toileting, which includes using toliet, bedpan, or urinal?: A Little Help from another person bathing (including washing, rinsing, drying)?: A Lot Help from another person to put on and taking off regular upper body clothing?: A Little Help from another person to put on and taking off regular lower body clothing?: A Little 6 Click Score: 18    End of Session Equipment Utilized During Treatment: Rolling walker  OT Visit Diagnosis: Unsteadiness on feet (R26.81);Muscle weakness (generalized) (M62.81)   Activity Tolerance Patient tolerated treatment well   Patient Left in chair;with call bell/phone within reach;with chair alarm set   Nurse Communication Mobility status        Time: 8099-8338 OT Time Calculation (min): 25 min  Charges: OT General Charges $OT Visit: 1 Visit OT Treatments $Self Care/Home Management : 8-22 mins $Therapeutic Exercise: 8-22 mins  Dessie Coma, M.S. OTR/L  10/19/20, 9:38 AM  ascom [phone]

## 2020-10-19 NOTE — Plan of Care (Signed)

## 2020-10-19 NOTE — Progress Notes (Signed)
Physical Therapy Treatment Patient Details Name: [name] MRN: [id number] DOB: 08/17/1933 Today's Date: 10/19/2020    History of Present Illness Pt is a 87 y.o. female with medical history significant of s/p for pacemaker placement due to complete heart block, HTN, HLD, stroke, GERD, gout, A fib not on AC, CAD, c diff colitis, rhabdomyolysis, CKD-IIIa, R breast cancer (s/p for mastectomy 2020), squamous cell lung cancer on chemo and radiation therapy, UTI, who presents to ED on 10/14/20 with weakness, dysuria and fall. Pt with positive UTI on 6/14, started Levaquin on 6/16.    PT Comments    Gradual improvement in gait distance and overall activity tolerance; remains generally fearful and anxious with all mobility efforts.  Limited balance reactions evident, but no overt buckling or LOB.  Does require use of RW and +1 assist at all times for optimal safety/indep. Do recommend discontinuation of purewick to encourage increased transfer opportunities to further develop safety, indep and confidence with all transfers.  Patient, CNA, RN informed/aware.    Follow Up Recommendations  SNF     Equipment Recommendations       Recommendations for Other Services       Precautions / Restrictions Precautions Precautions: Fall Restrictions Weight Bearing Restrictions: No    Mobility  Bed Mobility Overal bed mobility: Needs Assistance Bed Mobility: Supine to Sit     Supine to sit: Min assist          Transfers Overall transfer level: Needs assistance Equipment used: Rolling walker (2 wheeled) Transfers: Sit to/from Stand Sit to Stand: Min assist         General transfer comment: cuing for hand placement; min cuing for mechanics with transfer  Ambulation/Gait Ambulation/Gait assistance: Min assist Gait Distance (Feet):  (30' x1, 20' x1) Assistive device: Rolling walker (2 wheeled)       General Gait Details: fair step height/length, but very slow/guarded cadence  and overall gait peformance; limited balance reactions. Fatigues quickly and requires seated rest period after each gait trial   Stairs             Wheelchair Mobility    Modified Rankin (Stroke Patients Only)       Balance Overall balance assessment: Needs assistance Sitting-balance support: No upper extremity supported;Feet supported Sitting balance-Leahy Scale: Good     Standing balance support: Bilateral upper extremity supported Standing balance-Leahy Scale: Fair                              Cognition Arousal/Alertness: Awake/alert Behavior During Therapy: WFL for tasks assessed/performed Overall Cognitive Status: Within Functional Limits for tasks assessed                                        Exercises Other Exercises Other Exercises: Sit/stand x5 with RW, cga/min assist-emphasis on sequence and mechanics to optimize safety/indep with moevment transition    General Comments        Pertinent Vitals/Pain Pain Assessment: No/denies pain    Home Living                      Prior Function            PT Goals (current goals can now be found in the care plan section) Acute Rehab PT Goals Patient Stated Goal: to not fall again PT  Goal Formulation: With patient Time For Goal Achievement: 10/30/20 Potential to Achieve Goals: Good Progress towards PT goals: Progressing toward goals    Frequency    Min 2X/week      PT Plan Current plan remains appropriate    Co-evaluation              AM-PAC PT "6 Clicks" Mobility   Outcome Measure  Help needed turning from your back to your side while in a flat bed without using bedrails?: None Help needed moving from lying on your back to sitting on the side of a flat bed without using bedrails?: A Little Help needed moving to and from a bed to a chair (including a wheelchair)?: A Little Help needed standing up from a chair using your arms (e.g., wheelchair or  bedside chair)?: A Little Help needed to walk in hospital room?: A Little Help needed climbing 3-5 steps with a railing? : A Lot 6 Click Score: 18    End of Session Equipment Utilized During Treatment: Gait belt Activity Tolerance: Patient tolerated treatment well Patient left: in chair;with call bell/phone within reach;with chair alarm set Nurse Communication: Mobility status PT Visit Diagnosis: Unsteadiness on feet (R26.81);Muscle weakness (generalized) (M62.81);History of falling (Z91.81)     Time: 1856-3149 PT Time Calculation (min) (ACUTE ONLY): 27 min  Charges:  $Gait Training: 8-22 mins $Therapeutic Activity: 8-22 mins                     Miriana Gaertner H. Owens Shark, PT, DPT, NCS 10/19/20, 9:13 PM [phone]

## 2020-10-19 NOTE — Progress Notes (Signed)
Report given to Estill Bamberg, 1C Nurse assuming care of patient.

## 2020-10-19 NOTE — Plan of Care (Signed)
Problem: Education: Goal: Knowledge of General Education information will improve Description: Including pain rating scale, medication(s)/side effects and non-pharmacologic comfort measures 10/19/2020 1141 by Cristela Blue, RN Outcome: Progressing 10/19/2020 1141 by Cristela Blue, RN Outcome: Progressing   Problem: Health Behavior/Discharge Planning: Goal: Ability to manage health-related needs will improve 10/19/2020 1141 by Cristela Blue, RN Outcome: Progressing 10/19/2020 1141 by Cristela Blue, RN Outcome: Progressing   Problem: Clinical Measurements: Goal: Ability to maintain clinical measurements within normal limits will improve 10/19/2020 1141 by Cristela Blue, RN Outcome: Progressing 10/19/2020 1141 by Cristela Blue, RN Outcome: Progressing Goal: Will remain free from infection 10/19/2020 1141 by Cristela Blue, RN Outcome: Progressing 10/19/2020 1141 by Cristela Blue, RN Outcome: Progressing Goal: Diagnostic test results will improve 10/19/2020 1141 by Cristela Blue, RN Outcome: Progressing 10/19/2020 1141 by Cristela Blue, RN Outcome: Progressing Goal: Respiratory complications will improve 10/19/2020 1141 by Cristela Blue, RN Outcome: Progressing 10/19/2020 1141 by Cristela Blue, RN Outcome: Progressing Goal: Cardiovascular complication will be avoided 10/19/2020 1141 by Cristela Blue, RN Outcome: Progressing 10/19/2020 1141 by Cristela Blue, RN Outcome: Progressing   Problem: Activity: Goal: Risk for activity intolerance will decrease 10/19/2020 1141 by Cristela Blue, RN Outcome: Progressing 10/19/2020 1141 by Cristela Blue, RN Outcome: Progressing   Problem: Nutrition: Goal: Adequate nutrition will be maintained 10/19/2020 1141 by Cristela Blue, RN Outcome: Progressing 10/19/2020 1141 by Cristela Blue, RN Outcome: Progressing   Problem: Coping: Goal: Level of anxiety will decrease 10/19/2020 1141 by Cristela Blue, RN Outcome:  Progressing 10/19/2020 1141 by Cristela Blue, RN Outcome: Progressing   Problem: Elimination: Goal: Will not experience complications related to bowel motility 10/19/2020 1141 by Cristela Blue, RN Outcome: Progressing 10/19/2020 1141 by Cristela Blue, RN Outcome: Progressing Goal: Will not experience complications related to urinary retention 10/19/2020 1141 by Cristela Blue, RN Outcome: Progressing 10/19/2020 1141 by Cristela Blue, RN Outcome: Progressing   Problem: Pain Managment: Goal: General experience of comfort will improve 10/19/2020 1141 by Cristela Blue, RN Outcome: Progressing 10/19/2020 1141 by Cristela Blue, RN Outcome: Progressing   Problem: Safety: Goal: Ability to remain free from injury will improve 10/19/2020 1141 by Cristela Blue, RN Outcome: Progressing 10/19/2020 1141 by Cristela Blue, RN Outcome: Progressing   Problem: Skin Integrity: Goal: Risk for impaired skin integrity will decrease 10/19/2020 1141 by Cristela Blue, RN Outcome: Progressing 10/19/2020 1141 by Cristela Blue, RN Outcome: Progressing   Problem: Education: Goal: Knowledge of General Education information will improve Description: Including pain rating scale, medication(s)/side effects and non-pharmacologic comfort measures 10/19/2020 1141 by Cristela Blue, RN Outcome: Progressing 10/19/2020 1141 by Cristela Blue, RN Outcome: Progressing   Problem: Health Behavior/Discharge Planning: Goal: Ability to manage health-related needs will improve 10/19/2020 1141 by Cristela Blue, RN Outcome: Progressing 10/19/2020 1141 by Cristela Blue, RN Outcome: Progressing   Problem: Clinical Measurements: Goal: Ability to maintain clinical measurements within normal limits will improve 10/19/2020 1141 by Cristela Blue, RN Outcome: Progressing 10/19/2020 1141 by Cristela Blue, RN Outcome: Progressing Goal: Will remain free from infection 10/19/2020 1141 by Cristela Blue, RN Outcome:  Progressing 10/19/2020 1141 by Cristela Blue, RN Outcome: Progressing Goal: Diagnostic test results will improve 10/19/2020 1141 by Cristela Blue, RN Outcome: Progressing 10/19/2020 1141 by Cristela Blue, RN Outcome: Progressing Goal: Respiratory complications will improve 10/19/2020 1141 by Cristela Blue, RN Outcome: Progressing 10/19/2020 1141 by Cristela Blue, RN Outcome: Progressing Goal: Cardiovascular complication will be avoided 10/19/2020 1141 by Cristela Blue, RN Outcome: Progressing 10/19/2020 1141 by Cristela Blue, RN Outcome: Progressing   Problem: Activity: Goal:  Risk for activity intolerance will decrease 10/19/2020 1141 by Cristela Blue, RN Outcome: Progressing 10/19/2020 1141 by Cristela Blue, RN Outcome: Progressing   Problem: Nutrition: Goal: Adequate nutrition will be maintained 10/19/2020 1141 by Cristela Blue, RN Outcome: Progressing 10/19/2020 1141 by Cristela Blue, RN Outcome: Progressing   Problem: Coping: Goal: Level of anxiety will decrease 10/19/2020 1141 by Cristela Blue, RN Outcome: Progressing 10/19/2020 1141 by Cristela Blue, RN Outcome: Progressing   Problem: Elimination: Goal: Will not experience complications related to bowel motility 10/19/2020 1141 by Cristela Blue, RN Outcome: Progressing 10/19/2020 1141 by Cristela Blue, RN Outcome: Progressing Goal: Will not experience complications related to urinary retention 10/19/2020 1141 by Cristela Blue, RN Outcome: Progressing 10/19/2020 1141 by Cristela Blue, RN Outcome: Progressing   Problem: Pain Managment: Goal: General experience of comfort will improve 10/19/2020 1141 by Cristela Blue, RN Outcome: Progressing 10/19/2020 1141 by Cristela Blue, RN Outcome: Progressing   Problem: Safety: Goal: Ability to remain free from injury will improve 10/19/2020 1141 by Cristela Blue, RN Outcome: Progressing 10/19/2020 1141 by Cristela Blue, RN Outcome: Progressing   Problem:  Skin Integrity: Goal: Risk for impaired skin integrity will decrease 10/19/2020 1141 by Cristela Blue, RN Outcome: Progressing 10/19/2020 1141 by Cristela Blue, RN Outcome: Progressing

## 2020-10-20 ENCOUNTER — Ambulatory Visit: Payer: Medicare HMO

## 2020-10-20 ENCOUNTER — Inpatient Hospital Stay: Payer: Medicare HMO

## 2020-10-20 LAB — CULTURE, BLOOD (ROUTINE X 2)
Culture: NO GROWTH
Culture: NO GROWTH
Special Requests: ADEQUATE
Special Requests: ADEQUATE

## 2020-10-20 LAB — MAGNESIUM: Magnesium: 1.9 mg/dL (ref 1.7–2.4)

## 2020-10-20 NOTE — Progress Notes (Signed)
[facility] Progress Note  Patient: [name]    LFY:[id number]  DOA: 10/14/2020     Date of Service: the patient was seen and examined on 10/20/2020  Chief Complaint  Patient presents with   Weakness    Pt arrives to ED from home via EMS c/o generalized weakness x 1 week. Hx of lung ca, actively beng treated. Pt states she fell at home yesterday, abrasion noted on L knee. Pt denies pain at this time. A/O x 4.   Brief hospital course: Past medical history of HTN, HLD, CVA, GERD, gout, A. fib not on AC, CAD, CKD 3A, breast cancer right, squamous cell lung cancer PPM implant for CHB. Presented generalized weakness.  Found to have UTI and elevated troponin as well as tachyarrhythmia. Cardiology following for elevated troponin tachyarrhythmia. Currently on IV antibiotics for UTI. Also has chemotherapy-induced pancytopenia. Currently plan is arrange for safe discharge.  Medically stable.   Assessment and Plan: 1.  UTI, sepsis ruled out Recent positive culture for Proteus in 6/14. Outpatient treatment with Levaquin. Continues to have symptoms with fatigue and tiredness.  Brought to the hospital. Currently on IV cefazolin.  Switch to oral Keflex for 3 more days. Received IV fluids. So far no growth on the cultures.   2.  Elevated troponin. Likely demand ischemia. [place] cardiology consultation. Not consistent with ACS. Outpatient further work-up.   3.  Tachyarrhythmia/paroxysmal A. fib. HTN. CHB SP PPM implant. Cardiology discussed with EP.   Holding amlodipine and HCTZ. Currently on Cardizem 120 mg Continue to monitor on telemetry. Remains off of the anticoagulation   4.  CVA history HLD Continue statin. Continue Plavix.   5.  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 Continue letrozole. On chemoradiation right now. Patient has chronic leukopenia.  Appears  to have acute thrombocytopenia.  Last session of chemotherapy was on 6/14.  Discussed with oncology.  Will monitor.  Now appears to be stable.   6.  CKD 3A Hyperkalemia Hypercalcemia Renal function remained stable. Received Lokelma and IV fluids. Currently improving. Monitor.  Body mass index is 25.4 kg/m.  Nutrition Problem: Severe Malnutrition Etiology: cancer and cancer related treatments Interventions:        Diet: Cardiac diet DVT Prophylaxis: SCDs  Advance goals of care discussion: Full code  Family Communication: family was not present at bedside, at the time of interview.  The pt provided permission to discuss medical plan with the family. Opportunity was given to ask question and all questions were answered satisfactorily.   Disposition:  Pt is from Home, admitted with SNF, medically stable to discharge to SNF when we will receive insurance authorization.   Subjective: No overnight issues, patient denies any chest pain or palpitations, no any abdominal pain, no shortness of breath.  Physical Exam: General:  alert, NAD, laying in bed comfortably.  Appear in no distress, affect appropriate Eyes: PERRLA ENT: Oral Mucosa Clear, moist  Neck: no JVD,  Cardiovascular: S1 and S2 Present, no Murmur,  Respiratory: good respiratory effort, Bilateral Air entry equal and Decreased, no Crackles, no wheezes Abdomen: Bowel Sound present, Soft and no tenderness,  Skin: no rashes Extremities: no Pedal edema, no calf tenderness Neurologic: without any new focal findings Gait not checked due to patient safety concerns  Vitals:   10/20/20 0532 10/20/20 0818 10/20/20 0835 10/20/20 1134  BP: 118/74 120/87 (!) 143/77 111/71  Pulse: 62  61 65  Resp: 20  18 18  Temp: 98 F (36.7 C)  97.7 F (36.5 C) 97.8 F (36.6 C)  TempSrc: Oral     SpO2: 100%  100% 100%  Weight:      Height:        Intake/Output Summary (Last 24 hours) at 10/20/2020 1438 Last data filed at 10/20/2020  1497 Gross per 24 hour  Intake --  Output 250 ml  Net -250 ml   Filed Weights   10/17/20 1941  Weight: 63 kg    Data Reviewed: I have personally reviewed and interpreted daily labs, tele strips, imagings as discussed above. I reviewed all nursing notes, pharmacy notes, vitals, pertinent old records I have discussed plan of care as described above with RN and patient/family.  CBC: Recent Labs  Lab 10/14/20 2148 10/16/20 0602 10/17/20 0847 10/18/20 0828 10/19/20 0445  WBC 3.8* 2.0* 2.3* 2.5* 2.9*  NEUTROABS 3.6  --  2.0 2.2 2.6  HGB 13.3 10.5* 9.9* 10.6* 9.9*  HCT 42.0 33.0* 30.2* 32.9* 30.3*  MCV 82.7 83.8 82.3 81.8 81.9  PLT 128* 94* 80* 76* 73*   Basic Metabolic Panel: Recent Labs  Lab 10/14/20 2148 10/15/20 0233 10/15/20 1044 10/16/20 0602 10/17/20 0847 10/20/20 0500  NA 130*  --   --  135 133*  --   K 5.9* 5.8*  --  4.1 3.8  --   CL 99  --   --  104 103  --   CO2 21*  --   --  25 23  --   GLUCOSE 141*  --   --  89 98  --   BUN 53*  --   --  25* 19  --   CREATININE 1.05*  --   --  0.72 0.55  --   CALCIUM 10.4*  --   --  8.9 8.3*  --   MG  --   --  1.6* 1.9 1.5* 1.9    Studies: No results found.  Scheduled Meds:  allopurinol  100 mg Oral Daily   cephALEXin  500 mg Oral Q8H   clopidogrel  75 mg Oral Daily   dexamethasone  4 mg Oral Daily   dextromethorphan-guaiFENesin  1 tablet Oral BID   diltiazem  120 mg Oral Daily   feeding supplement  237 mL Oral TID BM   latanoprost  1 drop Both Eyes QHS   letrozole  2.5 mg Oral Daily   multivitamin with minerals  1 tablet Oral Daily   pantoprazole  40 mg Oral Daily   simvastatin  40 mg Oral q1800   Continuous Infusions: PRN Meds: acetaminophen, hydrALAZINE, hydrOXYzine, lidocaine-prilocaine, prochlorperazine  Time spent: 35 minutes  Author: Val Riles. MD [facility] 10/20/2020 2:38 PM  To reach On-call, see care teams to locate the attending and reach out to them via [email or web address]. If  7PM-7AM, please contact night-coverage If you still have difficulty reaching the attending provider, please page the [facility] (Director on Call) for [facility] on amion for assistance.

## 2020-10-20 NOTE — TOC Progression Note (Signed)
Transition of Care [facility]) - Progression Note    Patient Details  Name: [name] MRN: [id number] Date of Birth: 11/28/1933  Transition of Care [facility]) CM/SW Contact  Shelbie Hutching, RN Phone Number: 10/20/2020, 12:32 PM  Clinical Narrative:    RNCM checked with [facility] on insurance authorization with [employer].  Insurance Josem Kaufmann is still pending.     Expected Discharge Plan: Skilled Nursing Facility Barriers to Discharge: Insurance Authorization  Expected Discharge Plan and Services Expected Discharge Plan: [place]       Living arrangements for the past 2 months: Single Family Home Expected Discharge Date: 10/18/20                                     Social Determinants of Health (SDOH) Interventions    Readmission Risk Interventions No flowsheet data found.

## 2020-10-21 ENCOUNTER — Ambulatory Visit: Payer: Medicare HMO

## 2020-10-21 NOTE — TOC Progression Note (Signed)
Transition of Care [facility]) - Progression Note    Patient Details  Name: [name] MRN: [id number] Date of Birth: [birth year]  Transition of Care [facility]) CM/SW [facility], RN Phone Number: 10/21/2020, 9:54 AM  Clinical Narrative:   As per Tammy from [facility], auth number [id number].  [facility] does not have a bed today, but will have a bed tomorrow, 6/25.  OK to send patient tomorrow per Tammy.      Expected Discharge Plan: Skilled Nursing Facility Barriers to Discharge: Insurance Authorization  Expected Discharge Plan and Services Expected Discharge Plan: [place]       Living arrangements for the past 2 months: Single Family Home Expected Discharge Date: 10/18/20                                     Social Determinants of Health (SDOH) Interventions    Readmission Risk Interventions No flowsheet data found.

## 2020-10-21 NOTE — Progress Notes (Signed)
OT Cancellation Note  Patient Details Name: [name] MRN: [id number] DOB: [birth year]   Cancelled Treatment:    Reason Eval/Treat Not Completed: Fatigue/lethargy limiting ability to participate. Pt received in chair, stating that she had already been for radiation tx this AM and had just participated in PT, so requested time to rest. Will provide OT services at a later time/date, as pt is available and willing.   Josiah Lobo, PhD, MS, OTR/L 10/21/20, 12:27 PM

## 2020-10-21 NOTE — Care Management Important Message (Signed)
Important Message  Patient Details  Name: [name] MRN: [id number] Date of Birth: [birth year]   Medicare Important Message Given:  Yes     Dannette Barbara 10/21/2020, 10:45 AM

## 2020-10-21 NOTE — Progress Notes (Signed)
Physical Therapy Treatment Patient Details Name: AMIREE NO MRN: [id number] DOB: Nov 27, 1933 Today's Date: 10/21/2020    History of Present Illness Pt is a 87 y.o. female with medical history significant of s/p for pacemaker placement due to complete heart block, HTN, HLD, stroke, GERD, gout, A fib not on AC, CAD, c diff colitis, rhabdomyolysis, CKD-IIIa, R breast cancer (s/p for mastectomy 2020), squamous cell lung cancer on chemo and radiation therapy, UTI, who presents to ED on 10/14/20 with weakness, dysuria and fall. Pt with positive UTI on 6/14, started Levaquin on 6/16.    PT Comments    Patient received in bed, very slumped down in bed, without pure wick re-attached. NT called to assist with positioning patient and NT did not know patient had returned from radiation. Her bed/gown was wet due to not having pure wick on. Patient assisted from supine to sit with min assist. Transfers sit to stand with min assist and ambulated 3 feet from bed to recliner with RW. She performed seated LE strengthening exercises with cues. Patient will continue to benefit from skilled PT while here to improve functional independence, strength and safety with mobility.      Follow Up Recommendations  SNF     Equipment Recommendations  None recommended by PT    Recommendations for Other Services       Precautions / Restrictions Precautions Precautions: Fall Restrictions Weight Bearing Restrictions: No    Mobility  Bed Mobility Overal bed mobility: Needs Assistance Bed Mobility: Supine to Sit     Supine to sit: Min assist          Transfers Overall transfer level: Needs assistance Equipment used: Rolling walker (2 wheeled) Transfers: Sit to/from Stand Sit to Stand: Min assist         General transfer comment: cuing for hand placement; min cuing for mechanics with transfer  Ambulation/Gait Ambulation/Gait assistance: Min assist Gait Distance (Feet): 3 Feet Assistive device:  Rolling walker (2 wheeled) Gait Pattern/deviations: Step-to pattern;Shuffle;Decreased step length - right;Decreased step length - left Gait velocity: decr   General Gait Details: patient initially not wanting to walk, had been to radiation earlier. Agreed to get out of bed and to recliner when discovered her bed and she was wet.   Stairs             Wheelchair Mobility    Modified Rankin (Stroke Patients Only)       Balance Overall balance assessment: Needs assistance Sitting-balance support: Feet supported Sitting balance-Leahy Scale: Good     Standing balance support: Bilateral upper extremity supported;During functional activity Standing balance-Leahy Scale: Fair Standing balance comment: reliant on B UE support                            Cognition Arousal/Alertness: Awake/alert Behavior During Therapy: WFL for tasks assessed/performed Overall Cognitive Status: Within Functional Limits for tasks assessed                                        Exercises Other Exercises Other Exercises: B LE exercises: AP, SLR, hip abd/add, LAQ, seated marching x 10 reps    General Comments        Pertinent Vitals/Pain Pain Assessment: No/denies pain    Home Living  Prior Function            PT Goals (current goals can now be found in the care plan section) Acute Rehab PT Goals Patient Stated Goal: to not fall again PT Goal Formulation: With patient Time For Goal Achievement: 10/30/20 Potential to Achieve Goals: Good Progress towards PT goals: Progressing toward goals    Frequency    Min 2X/week      PT Plan Current plan remains appropriate    Co-evaluation              AM-PAC PT "6 Clicks" Mobility   Outcome Measure  Help needed turning from your back to your side while in a flat bed without using bedrails?: None Help needed moving from lying on your back to sitting on the side of a flat  bed without using bedrails?: A Little Help needed moving to and from a bed to a chair (including a wheelchair)?: A Little Help needed standing up from a chair using your arms (e.g., wheelchair or bedside chair)?: A Little Help needed to walk in hospital room?: A Little Help needed climbing 3-5 steps with a railing? : A Lot 6 Click Score: 18    End of Session Equipment Utilized During Treatment: Gait belt Activity Tolerance: Patient tolerated treatment well Patient left: in chair;with call bell/phone within reach;with chair alarm set Nurse Communication: Mobility status PT Visit Diagnosis: Unsteadiness on feet (R26.81);Muscle weakness (generalized) (M62.81);History of falling (Z91.81);Difficulty in walking, not elsewhere classified (R26.2)     Time: 4268-3419 PT Time Calculation (min) (ACUTE ONLY): 17 min  Charges:  $Therapeutic Exercise: 8-22 mins                    Maripaz Mullan, PT, GCS 10/21/20,11:35 AM

## 2020-10-21 NOTE — Progress Notes (Signed)
PT Cancellation Note  Patient Details Name: [name] MRN: [id number] DOB: Aug 06, 1933   Cancelled Treatment:    Reason Eval/Treat Not Completed: Patient at procedure or test/unavailable. Patient leaving room to go to cancer center upon my arrival. Will attempt later as time allows.      Savien Mamula 10/21/2020, 9:39 AM

## 2020-10-21 NOTE — Progress Notes (Signed)
[facility] Progress Note  Patient: [name]    AYT:[id number]  DOA: 10/14/2020     Date of Service: the patient was seen and examined on 10/21/2020  Chief Complaint  Patient presents with   Weakness    Pt arrives to ED from home via EMS c/o generalized weakness x 1 week. Hx of lung ca, actively beng treated. Pt states she fell at home yesterday, abrasion noted on L knee. Pt denies pain at this time. A/O x 4.   Brief hospital course: Past medical history of HTN, HLD, CVA, GERD, gout, A. fib not on AC, CAD, CKD 3A, breast cancer right, squamous cell lung cancer PPM implant for CHB. Presented generalized weakness.  Found to have UTI and elevated troponin as well as tachyarrhythmia. Cardiology following for elevated troponin tachyarrhythmia. Currently on IV antibiotics for UTI. Also has chemotherapy-induced pancytopenia. Currently plan is arrange for safe discharge.  Medically stable.   Assessment and Plan: 1.  UTI, sepsis ruled out Recent positive culture for Proteus in 6/14. Outpatient treatment with Levaquin. Continues to have symptoms with fatigue and tiredness.  Brought to the hospital. Currently on IV cefazolin.  Switch to oral Keflex for 3 more days. Received IV fluids. So far no growth on the cultures.   2.  Elevated troponin. Likely demand ischemia. [place] cardiology consultation. Not consistent with ACS. Outpatient further work-up.   3.  Tachyarrhythmia/paroxysmal A. fib. HTN. CHB SP PPM implant. Cardiology discussed with EP.   Holding amlodipine and HCTZ. Currently on Cardizem 120 mg Continue to monitor on telemetry. Remains off of the anticoagulation   4.  CVA history HLD Continue statin. Continue Plavix.   5.  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 Continue letrozole. On chemoradiation right now. Patient has chronic leukopenia.  Appears  to have acute thrombocytopenia.  Last session of chemotherapy was on 6/14.  Discussed with oncology.  Will monitor.  Now appears to be stable.   6.  CKD 3A Hyperkalemia Hypercalcemia Renal function remained stable. Received Lokelma and IV fluids. Currently improving. Monitor.  Body mass index is 25.4 kg/m.  Nutrition Problem: Severe Malnutrition Etiology: cancer and cancer related treatments Interventions:        Diet: Cardiac diet DVT Prophylaxis: SCDs  Advance goals of care discussion: Full code  Family Communication: family was not present at bedside, at the time of interview.  The pt provided permission to discuss medical plan with the family. Opportunity was given to ask question and all questions were answered satisfactorily.   Disposition:  Pt is from Home, admitted with SNF, medically stable to discharge to SNF, insurance authorization received, does not have bed today so plan is to discharge her  tomorrow to SNF.  Subjective: No overnight issues, patient denies any chest pain or palpitations, no any abdominal pain, no shortness of breath.  Physical Exam: General:  alert, NAD, laying in bed comfortably.  Appear in no distress, affect appropriate Eyes: PERRLA ENT: Oral Mucosa Clear, moist  Neck: no JVD,  Cardiovascular: S1 and S2 Present, no Murmur,  Respiratory: good respiratory effort, Bilateral Air entry equal and Decreased, no Crackles, no wheezes Abdomen: Bowel Sound present, Soft and no tenderness,  Skin: no rashes Extremities: no Pedal edema, no calf tenderness Neurologic: without any new focal findings Gait not checked due to patient safety concerns  Vitals:   10/20/20 2140 10/21/20 0627 10/21/20 0737 10/21/20 1204  BP: 121/71 135/72 [name])  142/88 132/79  Pulse: 67 62 69 66  Resp: 17 16 15 16   Temp: 98.6 F (37 C) 98.5 F (36.9 C) 98.2 F (36.8 C) 98.9 F (37.2 C)  TempSrc:      SpO2: 100% 100% 100% 100%  Weight:      Height:         Intake/Output Summary (Last 24 hours) at 10/21/2020 1214 Last data filed at 10/21/2020 1027 Gross per 24 hour  Intake 120 ml  Output 350 ml  Net -230 ml   Filed Weights   10/17/20 1941  Weight: 63 kg    Data Reviewed: I have personally reviewed and interpreted daily labs, tele strips, imagings as discussed above. I reviewed all nursing notes, pharmacy notes, vitals, pertinent old records I have discussed plan of care as described above with RN and patient/family.  CBC: Recent Labs  Lab 10/14/20 2148 10/16/20 0602 10/17/20 0847 10/18/20 0828 10/19/20 0445  WBC 3.8* 2.0* 2.3* 2.5* 2.9*  NEUTROABS 3.6  --  2.0 2.2 2.6  HGB 13.3 10.5* 9.9* 10.6* 9.9*  HCT 42.0 33.0* 30.2* 32.9* 30.3*  MCV 82.7 83.8 82.3 81.8 81.9  PLT 128* 94* 80* 76* 73*   Basic Metabolic Panel: Recent Labs  Lab 10/14/20 2148 10/15/20 0233 10/15/20 1044 10/16/20 0602 10/17/20 0847 10/20/20 0500  NA 130*  --   --  135 133*  --   K 5.9* 5.8*  --  4.1 3.8  --   CL 99  --   --  104 103  --   CO2 21*  --   --  25 23  --   GLUCOSE 141*  --   --  89 98  --   BUN 53*  --   --  25* 19  --   CREATININE 1.05*  --   --  0.72 0.55  --   CALCIUM 10.4*  --   --  8.9 8.3*  --   MG  --   --  1.6* 1.9 1.5* 1.9    Studies: No results found.  Scheduled Meds:  allopurinol  100 mg Oral Daily   cephALEXin  500 mg Oral Q8H   clopidogrel  75 mg Oral Daily   dexamethasone  4 mg Oral Daily   dextromethorphan-guaiFENesin  1 tablet Oral BID   diltiazem  120 mg Oral Daily   feeding supplement  237 mL Oral TID BM   latanoprost  1 drop Both Eyes QHS   letrozole  2.5 mg Oral Daily   multivitamin with minerals  1 tablet Oral Daily   pantoprazole  40 mg Oral Daily   simvastatin  40 mg Oral q1800   Continuous Infusions: PRN Meds: acetaminophen, hydrALAZINE, hydrOXYzine, lidocaine-prilocaine, prochlorperazine  Time spent: 35 minutes  Author: Val Riles. MD [facility] 10/21/2020 12:14 PM  To reach  On-call, see care teams to locate the attending and reach out to them via [email or web address]. If 7PM-7AM, please contact night-coverage If you still have difficulty reaching the attending provider, please page the [facility] (Director on Call) for [facility] on amion for assistance.

## 2020-10-22 LAB — RESP PANEL BY RT-PCR (FLU A&B, COVID) ARPGX2
Influenza A by PCR: NEGATIVE
Influenza B by PCR: NEGATIVE
SARS Coronavirus 2 by RT PCR: NEGATIVE

## 2020-10-22 NOTE — Discharge Summary (Signed)
[facility] Discharge Summary   Patient: [name] PVG:[id number]  PCP: Philmore Pali, NP  Date of admission: 10/14/2020   Date of discharge:  10/22/2020     Discharge Diagnoses:  Principal Problem:   UTI (urinary tract infection) Active Problems:   AV BLOCK, COMPLETE   Atrial fibrillation ([place])   History of CVA (cerebrovascular accident)   Essential hypertension   Hyperlipidemia   Elevated troponin   Malignant neoplasm of upper-outer quadrant of right breast in female, estrogen receptor positive ([place])   Squamous cell carcinoma of right lung (HCC)   CAD (coronary artery disease)   Weakness   Fall at home, initial encounter   CKD (chronic kidney disease), stage IIIa   Hyperkalemia   Hypercalcemia   Protein-calorie malnutrition, severe   Admitted From: Home Disposition:  SNF   Recommendations for Outpatient Follow-up:  PCP: in 1 wk Cradio in 1-2 wks Oncologist as per schedule Follow up LABS/TEST:  CBC with diff in 1 wk, BMP and Mag in 1 wk   Contact information for follow-up providers     Philmore Pali, NP In 2 days.   Specialty: Nurse Practitioner Why: Please call and schedule a follow up with your pcp Contact information: [place] [id number] [phone]              Contact information for after-discharge care     Destination     [facility] Preferred SNF .   Service: Skilled Nursing Contact information: [address] [place] [phone]                    Diet recommendation: Cardiac diet  Activity: The patient is advised to gradually reintroduce usual activities, as tolerated  Discharge Condition: stable  Code Status: Full code   History of present illness: As per the H and P dictated on admission Hospital Course:  Past medical history of HTN, HLD, CVA, GERD, gout, A. fib not on AC, CAD, CKD 3A, breast cancer right, squamous cell lung cancer PPM implant for  CHB. Presented generalized weakness.  Found to have UTI and elevated troponin as well as tachyarrhythmia. Cardiology consulted due to elevated troponin tachyarrhythmia. S/p IV antibiotics for UTI. Also has chemotherapy-induced pancytopenia.  Currently medically stable and she was awaiting for SNF placement. Assessment and Plan: # UTI, sepsis ruled out. Recent positive culture for Proteus in 6/14. S/p Outpatient treatment with Levaquin. Continues to have symptoms with fatigue and tiredness.  Brought to the hospital. S/p IV cefazolin, Switch to oral Keflex for 3 more days.Received IV fluids. So far no growth on the cultures.  No more need of antibiotics. # Elevated troponin. Likely demand ischemia.  Cardiology consulted, Not consistent with ACS. Outpatient further work-up. # Tachyarrhythmia/paroxysmal A. Fib, HTN, complete heart block, S/P PPM implant. Cardiology discussed with EP.  Holding amlodipine and HCTZ. Currently on Cardizem 120 mg. Remains off of the anticoagulation.  Follow with cardiology in 1 to 2 weeks # CVA history and HLD, Continue statin and Plavix. # Right breast cancer S/P mastectomy, Squamous cell carcinoma of the right lung Chemotherapy-induced pancytopenia. Follows up with oncology. Continue letrozole. On chemoradiation right now. Patient has chronic leukopenia.  Appears to have acute thrombocytopenia.  Last session of chemotherapy was on 6/14.  Discussed with oncology.  Will monitor.  Now appears to be stable.  Repeat CBC with differential after 1 week and follow-up with oncology. # CKD 3A, Hyperkalemia (Resolved),  Hypercalcemia (Resolved) Renal function remained stable., Received Lokelma and IV fluids.  Body mass index is 25.4 kg/m. Nutrition Problem: Severe Malnutrition. Etiology: cancer and cancer related treatments - Patient was instructed, not to drive, operate heavy machinery, perform activities at heights, swimming or participation in water activities or provide baby  sitting services while on Pain, Sleep and Anxiety Medications; until her outpatient Physician has advised to do so again.  - Also recommended to not to take more than prescribed Pain, Sleep and Anxiety Medications.  Patient was seen by physical therapy, who recommended SNF placement, which was arranged. On the day of the discharge the patient's vitals were stable, and no other acute medical condition were reported by patient. the patient was felt safe to be discharge at [facility].  Consultants: Cardiologist Procedures: None  Discharge Exam: General: Appear in no distress, no Rash; Oral Mucosa Clear, moist. Cardiovascular: S1 and S2 Present, no Murmur, Respiratory: normal respiratory effort, Bilateral Air entry present and no Crackles, no wheezes Abdomen: Bowel Sound present, Soft and no tenderness, no hernia Extremities: no Pedal edema, no calf tenderness Neurology: alert and oriented to time, place, and person affect appropriate.  Filed Weights   10/17/20 1941  Weight: 63 kg   Vitals:   10/22/20 0356 10/22/20 0736  BP: 123/69 133/74  Pulse: 61 63  Resp: 18 16  Temp: 98.9 F (37.2 C) 98 F (36.7 C)  SpO2: 100% 98%    DISCHARGE MEDICATION: Allergies as of 10/22/2020   No Known Allergies      Medication List     STOP taking these medications    amLODipine 5 MG tablet Commonly known as: NORVASC   hydrochlorothiazide 25 MG tablet Commonly known as: HYDRODIURIL   levofloxacin 500 MG tablet Commonly known as: Levaquin   potassium chloride SA 20 MEQ tablet Commonly known as: KLOR-CON   simvastatin 40 MG tablet Commonly known as: ZOCOR       TAKE these medications    acetaminophen 500 MG tablet Commonly known as: TYLENOL Take 500-1,000 mg by mouth every 6 (six) hours as needed for moderate pain.   alendronate 70 MG tablet Commonly known as: FOSAMAX TAKE 1 TABLET (70 MG TOTAL) BY MOUTH ONCE A WEEK. TAKE WITH A FULL GLASS OF WATER ON AN EMPTY STOMACH.    allopurinol 100 MG tablet Commonly known as: ZYLOPRIM Take 100 mg by mouth daily.   atorvastatin 20 MG tablet Commonly known as: Lipitor Take 1 tablet (20 mg total) by mouth daily.   Blood Pressure Kit Automated blood pressure measuring device.   clopidogrel 75 MG tablet Commonly known as: PLAVIX Take 75 mg by mouth daily.   dexamethasone 4 MG tablet Commonly known as: DECADRON Take 1 tablet (4 mg total) by mouth daily.   dextromethorphan-guaiFENesin 30-600 MG 12hr tablet Commonly known as: MUCINEX DM Take 1 tablet by mouth 2 (two) times daily.   diltiazem 120 MG 24 hr capsule Commonly known as: CARDIZEM CD Take 1 capsule (120 mg total) by mouth daily.   feeding supplement Liqd Take 237 mLs by mouth 3 (three) times daily between meals.   letrozole 2.5 MG tablet Commonly known as: FEMARA TAKE 1 TABLET BY MOUTH EVERY DAY   lidocaine-prilocaine cream Commonly known as: EMLA Apply to affected area once   Lumigan 0.01 % Soln Generic drug: bimatoprost Place 1 drop into both eyes at bedtime.   multivitamin with minerals Tabs tablet Take 1 tablet by mouth daily.   ondansetron 8 MG tablet Commonly  known as: Zofran Take 1 tablet (8 mg total) by mouth 2 (two) times daily as needed for refractory nausea / vomiting.   pantoprazole 40 MG tablet Commonly known as: PROTONIX Take 40 mg by mouth daily.   prochlorperazine 10 MG tablet Commonly known as: COMPAZINE Take 1 tablet (10 mg total) by mouth every 6 (six) hours as needed (Nausea or vomiting).               Durable Medical Equipment  (From admission, onward)           Start     Ordered   10/17/20 1504  For home use only DME lightweight manual wheelchair with seat cushion  Once       Comments: Patient suffers from physical decondition due to cancer and UTI which impairs their ability to perform daily activities like bathing, dressing, feeding, grooming, and toileting in the home.  A cane, crutch, or  walker will not resolve  issue with performing activities of daily living. A wheelchair will allow patient to safely perform daily activities. Patient is not able to propel themselves in the home using a standard weight wheelchair due to arm weakness, endurance, and general weakness. Patient can self propel in the lightweight wheelchair. Length of need Lifetime. Accessories: elevating leg rests (ELRs), wheel locks, extensions and anti-tippers.   10/17/20 1504   10/17/20 1503  For home use only DME 3 n 1  Once        10/17/20 1503           No Known Allergies Discharge Instructions     Call MD for:   Complete by: As directed    Chest pain or palpitations   Call MD for:  difficulty breathing, headache or visual disturbances   Complete by: As directed    Call MD for:  temperature >100.4   Complete by: As directed    Diet - low sodium heart healthy   Complete by: As directed    Diet - low sodium heart healthy   Complete by: As directed    Discharge instructions   Complete by: As directed    Follow with PCP in 1 week Follow with cardiology in 1 to 2 weeks Continue to follow with oncologist as per schedule   Increase activity slowly   Complete by: As directed    Increase activity slowly   Complete by: As directed        The results of significant diagnostics from this hospitalization (including imaging, microbiology, ancillary and laboratory) are listed below for reference.    Significant Diagnostic Studies: CT HEAD WO CONTRAST  Result Date: 10/15/2020 CLINICAL DATA:  Head trauma, minor. EXAM: CT HEAD WITHOUT CONTRAST TECHNIQUE: Contiguous axial images were obtained from the base of the skull through the vertex without intravenous contrast. COMPARISON:  Prior head CT examinations 08/15/2020 and earlier. FINDINGS: Brain: Mildly motion degraded exam. Moderate cerebral atrophy.  Comparatively mild cerebellar atrophy. Redemonstrated chronic lacunar infarcts within the left internal  capsule as well as right basal ganglia and adjacent radiating white matter tracts. Background moderate patchy and ill-defined hypoattenuation within the cerebral white matter, nonspecific but compatible with chronic small vessel ischemic disease. Redemonstrated chronic infarct within the inferior left cerebellar hemisphere. There is no acute intracranial hemorrhage. No demarcated cortical infarct. No extra-axial fluid collection. No evidence of intracranial mass. No midline shift. Vascular: No hyperdense vessel.  Atherosclerotic calcifications. Skull: Normal. Negative for fracture or focal lesion. Sinuses/Orbits: Visualized orbits show no acute finding.  Small volume frothy secretions and mild mucosal thickening within the bilateral maxillary sinuses. Mild bilateral ethmoid sinus mucosal thickening. IMPRESSION: No evidence of acute intracranial abnormality. Stable parenchymal atrophy and chronic small vessel ischemic disease with chronic lacunar infarcts, as described. Redemonstrated chronic infarct within the inferior left cerebellar hemisphere. Paranasal sinus disease at the imaged levels, as described. Electronically Signed   By: Kellie Simmering DO   On: 10/15/2020 09:28   DG Chest Portable 1 View  Result Date: 10/14/2020 CLINICAL DATA:  Recent fall with weakness, initial encounter EXAM: PORTABLE CHEST 1 VIEW COMPARISON:  07/24/2020, PET-CT from 08/15/2020 FINDINGS: Right-sided chest wall port is now seen in satisfactory position. Cardiac shadow is stable. Pacing device is again noted. Improved aeration in the right base is noted. Nodular density is noted in the right base similar to that seen on prior PET-CT. IMPRESSION: Nodular density in the right lung base similar to that seen on prior PET-CT. The large central area of mass and consolidation has resolved on the right. Electronically Signed   By: Inez Catalina M.D.   On: 10/14/2020 22:17   ECHOCARDIOGRAM COMPLETE  Result Date: 10/15/2020    ECHOCARDIOGRAM  REPORT   Patient Name:   CHAUNTEL WINDSOR Date of Exam: 10/15/2020 Medical Rec #:  [id number]           Height:       62.0 in Accession #:    [id number]          Weight:       131.0 lb Date of Birth:  December 15, 1933            BSA:          1.597 m Patient Age:    87 years            BP:           110/73 mmHg Patient Gender: F                   HR:           70 bpm. Exam Location:  [facility] Procedure: 2D Echo and Intracardiac Opacification Agent Indications:     Abnormal ECG R94.31  History:         Patient has prior history of Echocardiogram examinations, most                  recent 07/14/2018.  Sonographer:     Kathlen Brunswick RDCS Referring Phys:  [place] Diagnosing Phys: Ida Rogue MD  Sonographer Comments: Technically challenging study due to limited acoustic windows, suboptimal apical window, suboptimal parasternal window and suboptimal subcostal window. Image acquisition challenging due to respiratory motion. IMPRESSIONS  1. Left ventricular ejection fraction, by estimation, is 50 %. The left ventricle has low normal function. The left ventricle demonstrates regional wall motion abnormalities (septal wall motion abnormality, possibly secondary to paced rhythym). Left ventricular diastolic parameters are indeterminate.  2. Right ventricular systolic function was not well visualized. The right ventricular size is normal. FINDINGS  Left Ventricle: Left ventricular ejection fraction, by estimation, is 50 %. The left ventricle has low normal function. The left ventricle demonstrates regional wall motion abnormalities. Definity contrast agent was given IV to delineate the left ventricular endocardial borders. The left ventricular internal cavity size was normal in size. There is no left ventricular hypertrophy. Left ventricular diastolic parameters are indeterminate. Right Ventricle: The right ventricular size is normal. No increase in right ventricular wall thickness. Right ventricular  systolic function  was not well visualized. Left Atrium: Left atrial size was normal in size. Right Atrium: Right atrial size was normal in size. Pericardium: There is no evidence of pericardial effusion. Mitral Valve: The mitral valve was not well visualized. No evidence of mitral valve regurgitation. No evidence of mitral valve stenosis. Tricuspid Valve: The tricuspid valve is not well visualized. Tricuspid valve regurgitation is mild . No evidence of tricuspid stenosis. Aortic Valve: The aortic valve was not well visualized. Aortic valve regurgitation is not visualized. No aortic stenosis is present. Aortic valve peak gradient measures 5.1 mmHg. Pulmonic Valve: The pulmonic valve was not well visualized. Pulmonic valve regurgitation is not visualized. No evidence of pulmonic stenosis. Aorta: The aortic root was not well visualized and the ascending aorta was not well visualized. Venous: The pulmonary veins were not well visualized. The inferior vena cava is normal in size with greater than 50% respiratory variability, suggesting right atrial pressure of 3 mmHg. IAS/Shunts: No atrial level shunt detected by color flow Doppler.  LEFT VENTRICLE PLAX 2D LVIDd:         3.90 cm  Diastology LVIDs:         2.70 cm  LV e' lateral:   6.96 cm/s LV PW:         1.10 cm  LV E/e' lateral: 6.4 LV IVS:        1.00 cm LVOT diam:     2.10 cm LV SV:         57 LV SV Index:   36 LVOT Area:     3.46 cm  LEFT ATRIUM         Index LA diam:    3.80 cm 2.38 cm/m  AORTIC VALVE AV Area (Vmax): 2.60 cm AV Vmax:        113.00 cm/s AV Peak Grad:   5.1 mmHg LVOT Vmax:      84.90 cm/s LVOT Vmean:     65.000 cm/s LVOT VTI:       0.164 m  AORTA Ao Root diam: 3.00 cm MITRAL VALVE               TRICUSPID VALVE MV Area (PHT): 3.20 cm    TV Peak grad:   26.5 mmHg MV Decel Time: 237 msec    TV Vmax:        2.58 m/s MV E velocity: 44.60 cm/s MV A velocity: 80.10 cm/s  SHUNTS MV E/A ratio:  0.56        Systemic VTI:  0.16 m                            Systemic Diam:  2.10 cm Ida Rogue MD Electronically signed by Ida Rogue MD Signature Date/Time: 10/15/2020/2:26:15 PM    Final     Microbiology: Recent Results (from the past 240 hour(s))  Resp Panel by RT-PCR (Flu A&B, Covid) Nasopharyngeal Swab     Status: None   Collection Time: 10/14/20  9:48 PM   Specimen: Nasopharyngeal Swab; Nasopharyngeal(NP) swabs in vial transport medium  Result Value Ref Range Status   SARS Coronavirus 2 by RT PCR NEGATIVE NEGATIVE Final    Comment: (NOTE) SARS-CoV-2 target nucleic acids are NOT DETECTED.  The SARS-CoV-2 RNA is generally detectable in upper respiratory specimens during the acute phase of infection. The lowest concentration of SARS-CoV-2 viral copies this assay can detect is 138 copies/mL. A negative result does not preclude SARS-Cov-2 infection  and should not be used as the sole basis for treatment or other patient management decisions. A negative result may occur with  improper specimen collection/handling, submission of specimen other than nasopharyngeal swab, presence of viral mutation(s) within the areas targeted by this assay, and inadequate number of viral copies(<138 copies/mL). A negative result must be combined with clinical observations, patient history, and epidemiological information. The expected result is Negative.  Fact Sheet for Patients:  EntrepreneurPulse.com.au  Fact Sheet for Healthcare Providers:  IncredibleEmployment.be  This test is no t yet approved or cleared by the [place] FDA and  has been authorized for detection and/or diagnosis of SARS-CoV-2 by FDA under an Emergency Use Authorization (EUA). This EUA will remain  in effect (meaning this test can be used) for the duration of the COVID-19 declaration under Section 564(b)(1) of the Act, 21 U.S.C.section 360bbb-3(b)(1), unless the authorization is terminated  or revoked sooner.       Influenza A by PCR NEGATIVE NEGATIVE  Final   Influenza B by PCR NEGATIVE NEGATIVE Final    Comment: (NOTE) The Xpert Xpress SARS-CoV-2/FLU/RSV plus assay is intended as an aid in the diagnosis of influenza from Nasopharyngeal swab specimens and should not be used as a sole basis for treatment. Nasal washings and aspirates are unacceptable for Xpert Xpress SARS-CoV-2/FLU/RSV testing.  Fact Sheet for Patients: EntrepreneurPulse.com.au  Fact Sheet for Healthcare Providers: IncredibleEmployment.be  This test is not yet approved or cleared by the [place] FDA and has been authorized for detection and/or diagnosis of SARS-CoV-2 by FDA under an Emergency Use Authorization (EUA). This EUA will remain in effect (meaning this test can be used) for the duration of the COVID-19 declaration under Section 564(b)(1) of the Act, 21 U.S.C. section 360bbb-3(b)(1), unless the authorization is terminated or revoked.  Performed at [facility], [address] [place], [place] [address]   Culture, blood (x 2)     Status: None   Collection Time: 10/15/20 10:45 AM   Specimen: BLOOD  Result Value Ref Range Status   Specimen Description BLOOD LEFT ANTECUBITAL  Final   Special Requests   Final    BOTTLES DRAWN AEROBIC AND ANAEROBIC Blood Culture adequate volume   Culture   Final    NO GROWTH 5 DAYS Performed at [facility], [address] [place], [place] [address]    Report Status 10/20/2020 FINAL  Final  Culture, blood (x 2)     Status: None   Collection Time: 10/15/20 10:52 AM   Specimen: BLOOD  Result Value Ref Range Status   Specimen Description BLOOD BLOOD LEFT HAND  Final   Special Requests   Final    BOTTLES DRAWN AEROBIC ONLY Blood Culture adequate volume   Culture   Final    NO GROWTH 5 DAYS Performed at [facility], [address] [place], [place] [address]    Report Status 10/20/2020 FINAL  Final     Labs: CBC: Recent Labs  Lab  10/16/20 0602 10/17/20 0847 10/18/20 0828 10/19/20 0445  WBC 2.0* 2.3* 2.5* 2.9*  NEUTROABS  --  2.0 2.2 2.6  HGB 10.5* 9.9* 10.6* 9.9*  HCT 33.0* 30.2* 32.9* 30.3*  MCV 83.8 82.3 81.8 81.9  PLT 94* 80* 76* 73*   Basic Metabolic Panel: Recent Labs  Lab 10/16/20 0602 10/17/20 0847 10/20/20 0500  NA 135 133*  --   K 4.1 3.8  --   CL 104 103  --   CO2 25 23  --  GLUCOSE 89 98  --   BUN 25* 19  --   CREATININE 0.72 0.55  --   CALCIUM 8.9 8.3*  --   MG 1.9 1.5* 1.9   Liver Function Tests: Recent Labs  Lab 10/17/20 0847  AST 33  ALT 37  ALKPHOS 37*  BILITOT 0.7  PROT 6.2*  ALBUMIN 2.9*   No results for input(s): LIPASE, AMYLASE in the last 168 hours. No results for input(s): AMMONIA in the last 168 hours. Cardiac Enzymes: No results for input(s): CKTOTAL, CKMB, CKMBINDEX, TROPONINI in the last 168 hours. BNP (last 3 results) No results for input(s): BNP in the last 8760 hours. CBG: No results for input(s): GLUCAP in the last 168 hours.  Time spent: 35 minutes  Signed:  Val Riles  [facility]  10/22/2020 11:41 AM

## 2020-10-22 NOTE — Progress Notes (Deleted)
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  INTERVAL HISTORY: Patient returns to clinic today for further evaluation and consideration of cycle 5 of weekly carboplatinum and Taxol.  She did not receive treatment last week secondary to neutropenia.  She complains of increased urinary continence, but her daughter feels this is secondary to her diuretic.  She has chronic weakness and fatigue.  She is tolerating her treatments well without significant side effects. She has no neurologic complaints.  She denies any recent fevers. She denies any chest pain, shortness of breath, hemoptysis, or cough.  She denies any nausea, vomiting, constipation, or diarrhea.  She has no other urinary complaints.  Patient offers no further specific complaints today.  REVIEW OF SYSTEMS:   Review of Systems  Constitutional:  Positive for malaise/fatigue and weight loss. Negative for fever.  Respiratory: Negative.  Negative for cough, hemoptysis and shortness of breath.   Cardiovascular: Negative.  Negative for chest pain and leg swelling.  Gastrointestinal: Negative.  Negative for abdominal pain.  Genitourinary:  Positive for frequency. Negative for dysuria.  Musculoskeletal: Negative.  Negative for back pain.  Skin: Negative.  Negative for rash.  Neurological:  Positive for weakness. Negative for dizziness, focal weakness and headaches.  Psychiatric/Behavioral: Negative.  The patient is not  nervous/anxious.    As per HPI. Otherwise, a complete review of systems is negative.  PAST MEDICAL HISTORY: Past Medical History:  Diagnosis Date   Breast cancer, right ([place]) 11/2018   Mastectomy   C. difficile colitis    CKD (chronic kidney disease), stage III ([place])    Complete heart block ([place])    a. Originally placed 08/2004; b. 07/2013 s/p MDT Opp PPM (ser # [id number]).   Dyspnea    Gait abnormality 07/23/2016   GERD (gastroesophageal reflux disease)    Gout    Hemoptysis 07/25/2020   History of hypertension    History of stroke    Hyperhomocysteinemia (HCC)    Hyperlipidemia    Hypertension    Hypokalemia 07/13/2018   Low blood magnesium 07/13/2018   Lung cancer ([place])    Pacemaker-Medtronic 08/07/2011   PAF (paroxysmal atrial fibrillation) ([place])    a. 07/2020 Device interrogation-->13.3% AF burden-->longest atrial high rate episode 24:26 mins-->AT vs AF. Not on [place].   Pulmonary hypertension ([place])    a. 06/2018 Echo: EF 60-65%, no rwma, nl RV fxn, RVSP 48.40mHg. Mild AoV Ca2+.   Rhabdomyolysis    Stroke ([facility]    left sided weakness, 07/28/20- no residual    Vomiting 07/13/2018    PAST SURGICAL HISTORY: Past Surgical History:  Procedure Laterality Date   BIOPSY  07/15/2018   Procedure: BIOPSY;  Surgeon: GGatha Mayer MD;  Location: [facility] ENDOSCOPY;  Service: Gastroenterology;;   BREAST BIOPSY Right 10/27/2018   affirm bx of mass, x clip,  INVASIVE MAMMARY CARCINOMA   BREAST LUMPECTOMY Right 12/03/2018   BRONCHIAL BIOPSY  07/29/2020   Procedure: BRONCHIAL BIOPSIES;  Surgeon: OLaurin Coder MD;  [facility];  Service: Pulmonary;;   BRONCHIAL NEEDLE ASPIRATION BIOPSY  07/29/2020   Procedure: BRONCHIAL NEEDLE ASPIRATION BIOPSIES;  Surgeon: Laurin Coder, MD;  Location: [place];  Service: Pulmonary;;   CARDIAC CATHETERIZATION     ENDOBRONCHIAL ULTRASOUND N/A 07/29/2020   Procedure: ENDOBRONCHIAL ULTRASOUND;  Surgeon: Laurin Coder,  MD;  Location: [facility] ENDOSCOPY;  Service: Pulmonary;  Laterality: N/A;   ESOPHAGOGASTRODUODENOSCOPY Left 07/15/2018   Procedure: ESOPHAGOGASTRODUODENOSCOPY (EGD);  Surgeon: [name], MD;  Location: [facility];  Service: Gastroenterology;  Laterality: Left;   HEMOSTASIS CONTROL  07/29/2020   Procedure: HEMOSTASIS CONTROL;  Surgeon: Laurin Coder, MD;  Location: [facility] ENDOSCOPY;  Service: Pulmonary;;   PACEMAKER INSERTION     Medtronic Enpulse dual-chamber pacemaker   PARTIAL MASTECTOMY WITH NEEDLE LOCALIZATION AND AXILLARY SENTINEL LYMPH NODE BX Right 12/03/2018   Procedure: PARTIAL MASTECTOMY WITH NEEDLE LOCALIZATION AND AXILLARY SENTINEL LYMPH NODE BX, RIGHT;  Surgeon: [name], MD;  Location: [facility] ORS;  Service: General;  Laterality: Right;   PERMANENT PACEMAKER GENERATOR CHANGE N/A 07/30/2013   Procedure: PERMANENT PACEMAKER GENERATOR CHANGE;  Surgeon: [name], MD;  Location: [facility] CATH LAB;  Service: Cardiovascular;  Laterality: N/A;   PORTA CATH INSERTION N/A 08/16/2020   Procedure: PORTA CATH INSERTION;  Surgeon: Katha Cabal, MD;  Location: [facility] CV LAB;  Service: Cardiovascular;  Laterality: N/A;   VIDEO BRONCHOSCOPY N/A 07/29/2020   Procedure: VIDEO BRONCHOSCOPY WITH FLUORO;  Surgeon: Laurin Coder, MD;  Location: [facility] ENDOSCOPY;  Service: Pulmonary;  Laterality: N/A;    FAMILY HISTORY: Family History  Problem Relation Age of Onset   Hypertension Mother    Breast cancer Sister    Breast cancer Sister    Colon cancer Neg Hx    Esophageal cancer Neg Hx     ADVANCED DIRECTIVES (Y/N):  N  HEALTH MAINTENANCE: Social History   Tobacco Use   Smoking status: Former    Pack years: 0.00    Types: Cigarettes    Quit date: 07/25/2006    Years since quitting: 14.2   Smokeless tobacco: Never  Vaping Use   Vaping Use: Never used  Substance Use Topics   Alcohol use: No   Drug use: No     Colonoscopy:  PAP:  Bone density:  Lipid panel:  No  Known Allergies  Current Outpatient Medications  Medication Sig Dispense Refill   acetaminophen (TYLENOL) 500 MG tablet Take 500-1,000 mg by mouth every 6 (six) hours as needed for moderate pain.      alendronate (FOSAMAX) 70 MG tablet TAKE 1 TABLET (70 MG TOTAL) BY MOUTH ONCE A WEEK. TAKE WITH A FULL GLASS OF WATER ON AN EMPTY STOMACH. 12 tablet 2   allopurinol (ZYLOPRIM) 100 MG tablet Take 100 mg by mouth daily.     atorvastatin (LIPITOR) 20 MG tablet Take 1 tablet (20 mg total) by mouth daily. 30 tablet 0   Blood Pressure KIT Automated blood pressure measuring device. 1 each 0   clopidogrel (PLAVIX) 75 MG tablet Take 75 mg by mouth daily.     dexamethasone (DECADRON) 4 MG tablet Take 1 tablet (4 mg total) by mouth daily. 30 tablet 1   dextromethorphan-guaiFENesin (MUCINEX DM) 30-600 MG 12hr tablet Take 1 tablet by mouth 2 (two) times daily. 30 tablet 0   diltiazem (CARDIZEM CD) 120 MG 24 hr capsule Take 1 capsule (120 mg total) by mouth daily. 30 capsule 0   feeding supplement (ENSURE ENLIVE / ENSURE PLUS) LIQD  Take 237 mLs by mouth 3 (three) times daily between meals. 10000 mL 0   letrozole (FEMARA) 2.5 MG tablet TAKE 1 TABLET BY MOUTH EVERY DAY 90 tablet 3   lidocaine-prilocaine (EMLA) cream Apply to affected area once 30 g 3   LUMIGAN 0.01 % SOLN Place 1 drop into both eyes at bedtime.  4   Multiple Vitamin (MULTIVITAMIN WITH MINERALS) TABS tablet Take 1 tablet by mouth daily. 30 tablet 0   ondansetron (ZOFRAN) 8 MG tablet Take 1 tablet (8 mg total) by mouth 2 (two) times daily as needed for refractory nausea / vomiting. 60 tablet 2   pantoprazole (PROTONIX) 40 MG tablet Take 40 mg by mouth daily.     prochlorperazine (COMPAZINE) 10 MG tablet Take 1 tablet (10 mg total) by mouth every 6 (six) hours as needed (Nausea or vomiting). 60 tablet 2   No current facility-administered medications for this visit.   Facility-Administered Medications Ordered in Other Visits  Medication Dose Route  Frequency Provider Last Rate Last Admin   acetaminophen (TYLENOL) tablet 650 mg  650 mg Oral Q6H PRN Ivor Costa, MD       allopurinol (ZYLOPRIM) tablet 100 mg  100 mg Oral Daily Ivor Costa, MD   100 mg at 10/22/20 1022   clopidogrel (PLAVIX) tablet 75 mg  75 mg Oral Daily Ivor Costa, MD   75 mg at 10/22/20 1023   dexamethasone (DECADRON) tablet 4 mg  4 mg Oral Daily Ivor Costa, MD   4 mg at 10/22/20 1022   dextromethorphan-guaiFENesin ([place] DM) 30-600 MG per 12 hr tablet 1 tablet  1 tablet Oral BID Lavina Hamman, MD   1 tablet at 10/22/20 1022   diltiazem (CARDIZEM CD) 24 hr capsule 120 mg  120 mg Oral Daily Theora Gianotti, NP   120 mg at 10/22/20 1022   feeding supplement (ENSURE ENLIVE / ENSURE PLUS) liquid 237 mL  237 mL Oral TID BM Lavina Hamman, MD   237 mL at 10/22/20 1024   hydrALAZINE (APRESOLINE) injection 5 mg  5 mg Intravenous Q2H PRN Ivor Costa, MD       hydrOXYzine (ATARAX/VISTARIL) tablet 25 mg  25 mg Oral TID PRN Ivor Costa, MD       latanoprost (XALATAN) 0.005 % ophthalmic solution 1 drop  1 drop Both Eyes QHS Ivor Costa, MD   1 drop at 10/21/20 2116   letrozole [facility]) tablet 2.5 mg  2.5 mg Oral Daily Ivor Costa, MD   2.5 mg at 10/21/20 2115   lidocaine-prilocaine (EMLA) cream   Topical Daily PRN Ivor Costa, MD       multivitamin with minerals tablet 1 tablet  1 tablet Oral Daily Lavina Hamman, MD   1 tablet at 10/22/20 1022   pantoprazole (PROTONIX) EC tablet 40 mg  40 mg Oral Daily Ivor Costa, MD   40 mg at 10/22/20 1023   prochlorperazine (COMPAZINE) tablet 10 mg  10 mg Oral Q6H PRN Ivor Costa, MD       simvastatin (ZOCOR) tablet 40 mg  40 mg Oral q1800 Ivor Costa, MD   40 mg at 10/21/20 1719   sodium chloride flush (NS) 0.9 % injection 10 mL  10 mL Intravenous PRN [name], MD   10 mL at 09/13/20 7353    OBJECTIVE: There were no vitals filed for this visit.    There is no height or weight on Allison to calculate BMI.    ECOG FS:1 -  Symptomatic  but completely ambulatory  General: Well-developed, well-nourished, no acute distress. Eyes: Pink conjunctiva, anicteric sclera. HEENT: Normocephalic, moist mucous membranes. Lungs: No audible wheezing or coughing. Heart: Regular rate and rhythm. Abdomen: Soft, nontender, no obvious distention. Musculoskeletal: No edema, cyanosis, or clubbing. Neuro: Alert, answering all questions appropriately. Cranial nerves grossly intact. Skin: No rashes or petechiae noted. Psych: Normal affect.   LAB RESULTS:  Lab Results  Component Value Date   NA 133 (L) 10/17/2020   K 3.8 10/17/2020   CL 103 10/17/2020   CO2 23 10/17/2020   GLUCOSE 98 10/17/2020   BUN 19 10/17/2020   CREATININE 0.55 10/17/2020   CALCIUM 8.3 (L) 10/17/2020   PROT 6.2 (L) 10/17/2020   ALBUMIN 2.9 (L) 10/17/2020   AST 33 10/17/2020   ALT 37 10/17/2020   ALKPHOS 37 (L) 10/17/2020   BILITOT 0.7 10/17/2020   GFRNONAA >60 10/17/2020   GFRAA >60 07/24/2018    Lab Results  Component Value Date   WBC 2.9 (L) 10/19/2020   NEUTROABS 2.6 10/19/2020   HGB 9.9 (L) 10/19/2020   HCT 30.3 (L) 10/19/2020   MCV 81.9 10/19/2020   PLT 73 (L) 10/19/2020     STUDIES: CT HEAD WO CONTRAST  Result Date: 10/15/2020 CLINICAL DATA:  Head trauma, minor. EXAM: CT HEAD WITHOUT CONTRAST TECHNIQUE: Contiguous axial images were obtained from the base of the skull through the vertex without intravenous contrast. COMPARISON:  Prior head CT examinations 08/15/2020 and earlier. FINDINGS: Brain: Mildly motion degraded exam. Moderate cerebral atrophy.  Comparatively mild cerebellar atrophy. Redemonstrated chronic lacunar infarcts within the left internal capsule as well as right basal ganglia and adjacent radiating white matter tracts. Background moderate patchy and ill-defined hypoattenuation within the cerebral white matter, nonspecific but compatible with chronic small vessel ischemic disease. Redemonstrated chronic infarct within the inferior  left cerebellar hemisphere. There is no acute intracranial hemorrhage. No demarcated cortical infarct. No extra-axial fluid collection. No evidence of intracranial mass. No midline shift. Vascular: No hyperdense vessel.  Atherosclerotic calcifications. Skull: Normal. Negative for fracture or focal lesion. Sinuses/Orbits: Visualized orbits show no acute finding. Small volume frothy secretions and mild mucosal thickening within the bilateral maxillary sinuses. Mild bilateral ethmoid sinus mucosal thickening. IMPRESSION: No evidence of acute intracranial abnormality. Stable parenchymal atrophy and chronic small vessel ischemic disease with chronic lacunar infarcts, as described. Redemonstrated chronic infarct within the inferior left cerebellar hemisphere. Paranasal sinus disease at the imaged levels, as described. Electronically Signed   By: Kellie Simmering DO   On: 10/15/2020 09:28   DG Chest Portable 1 View  Result Date: 10/14/2020 CLINICAL DATA:  Recent fall with weakness, initial encounter EXAM: PORTABLE CHEST 1 VIEW COMPARISON:  07/24/2020, PET-CT from 08/15/2020 FINDINGS: Right-sided chest wall port is now seen in satisfactory position. Cardiac shadow is stable. Pacing device is again noted. Improved aeration in the right base is noted. Nodular density is noted in the right base similar to that seen on prior PET-CT. IMPRESSION: Nodular density in the right lung base similar to that seen on prior PET-CT. The large central area of mass and consolidation has resolved on the right. Electronically Signed   By: Inez Catalina M.D.   On: 10/14/2020 22:17   ECHOCARDIOGRAM COMPLETE  Result Date: 10/15/2020    ECHOCARDIOGRAM REPORT   Patient Name:   [name] Date of Exam: 10/15/2020 Medical Rec #:  [id number]           Height:  62.0 in Accession #:    [id number]          Weight:       131.0 lb Date of Birth:  28-May-1933            BSA:          1.597 m Patient Age:    30 years            BP:            110/73 mmHg Patient Gender: F                   HR:           70 bpm. Exam Location:  [facility] Procedure: 2D Echo and Intracardiac Opacification Agent Indications:     Abnormal ECG R94.31  History:         Patient has prior history of Echocardiogram examinations, most                  recent 07/14/2018.  Sonographer:     Kathlen Brunswick RDCS Referring Phys:  [place] Diagnosing Phys: Ida Rogue MD  Sonographer Comments: Technically challenging study due to limited acoustic windows, suboptimal apical window, suboptimal parasternal window and suboptimal subcostal window. Image acquisition challenging due to respiratory motion. IMPRESSIONS  1. Left ventricular ejection fraction, by estimation, is 50 %. The left ventricle has low normal function. The left ventricle demonstrates regional wall motion abnormalities (septal wall motion abnormality, possibly secondary to paced rhythym). Left ventricular diastolic parameters are indeterminate.  2. Right ventricular systolic function was not well visualized. The right ventricular size is normal. FINDINGS  Left Ventricle: Left ventricular ejection fraction, by estimation, is 50 %. The left ventricle has low normal function. The left ventricle demonstrates regional wall motion abnormalities. Definity contrast agent was given IV to delineate the left ventricular endocardial borders. The left ventricular internal cavity size was normal in size. There is no left ventricular hypertrophy. Left ventricular diastolic parameters are indeterminate. Right Ventricle: The right ventricular size is normal. No increase in right ventricular wall thickness. Right ventricular systolic function was not well visualized. Left Atrium: Left atrial size was normal in size. Right Atrium: Right atrial size was normal in size. Pericardium: There is no evidence of pericardial effusion. Mitral Valve: The mitral valve was not well visualized. No evidence of mitral valve regurgitation. No  evidence of mitral valve stenosis. Tricuspid Valve: The tricuspid valve is not well visualized. Tricuspid valve regurgitation is mild . No evidence of tricuspid stenosis. Aortic Valve: The aortic valve was not well visualized. Aortic valve regurgitation is not visualized. No aortic stenosis is present. Aortic valve peak gradient measures 5.1 mmHg. Pulmonic Valve: The pulmonic valve was not well visualized. Pulmonic valve regurgitation is not visualized. No evidence of pulmonic stenosis. Aorta: The aortic root was not well visualized and the ascending aorta was not well visualized. Venous: The pulmonary veins were not well visualized. The inferior vena cava is normal in size with greater than 50% respiratory variability, suggesting right atrial pressure of 3 mmHg. IAS/Shunts: No atrial level shunt detected by color flow Doppler.  LEFT VENTRICLE PLAX 2D LVIDd:         3.90 cm  Diastology LVIDs:         2.70 cm  LV e' lateral:   6.96 cm/s LV PW:         1.10 cm  LV E/e' lateral: 6.4 LV IVS:  1.00 cm LVOT diam:     2.10 cm LV SV:         57 LV SV Index:   36 LVOT Area:     3.46 cm  LEFT ATRIUM         Index LA diam:    3.80 cm 2.38 cm/m  AORTIC VALVE AV Area (Vmax): 2.60 cm AV Vmax:        113.00 cm/s AV Peak Grad:   5.1 mmHg LVOT Vmax:      84.90 cm/s LVOT Vmean:     65.000 cm/s LVOT VTI:       0.164 m  AORTA Ao Root diam: 3.00 cm MITRAL VALVE               TRICUSPID VALVE MV Area (PHT): 3.20 cm    TV Peak grad:   26.5 mmHg MV Decel Time: 237 msec    TV Vmax:        2.58 m/s MV E velocity: 44.60 cm/s MV A velocity: 80.10 cm/s  SHUNTS MV E/A ratio:  0.56        Systemic VTI:  0.16 m                            Systemic Diam: 2.10 cm Ida Rogue MD Electronically signed by Ida Rogue MD Signature Date/Time: 10/15/2020/2:26:15 PM    Final     ASSESSMENT: Pathologic stage Ia ER/PR positive, 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  Now with stage IIIa squamous cell  carcinoma of the right lung.  PLAN:    1.  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  CT scan results from July 25, 2020 reviewed independently and reported as above.  Patient subsequently underwent bronchoscopy on July 29, 2020 confirming the diagnosis.  PET scan results from August 15, 2020 reviewed independently and reported as above confirming stage of disease.  CT of the head did not reveal any metastatic lesions.  Plan to give weekly carboplatinum and Taxol with concurrent XRT followed by maintenance immunotherapy for 1 year.  Patient has had port placement.  Proceed with cycle 5 of treatment today. Continue daily XRT completing treatment on October 20, 2020.  Return to clinic in 1 week for her sixth and final treatment of carboplatin and Taxol.  At which point, we will transition her to maintenance durvalumab.  2.  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  Given her advanced age and small size of malignancy, Oncotype DX or adjuvant chemotherapy was not necessary.  She was also evaluated by radiation oncology who determined that adjuvant XRT was also not needed.  Continue letrozole for a total of 5 years completing treatment in August 2025.  Although we will likely discontinue treatment while she is receiving chemotherapy for her lung cancer.  Her most recent mammogram on November 13, 2019 was reported as BI-RADS 2, repeat in July 2022.    2.  Osteoporosis: Patient's most recent bone mineral density on January 14, 2020 reported T score of -2.6 which is mildly improved over 1 year prior where her T score was reported at -2.8.  She is tolerating letrozole well so we will continue this medication as prescribed.  Continue Fosamax, calcium, and vitamin D supplementation.  Repeat bone mineral density in September 2022.  3.  Anemia: Resolved. 4.  Hemoptysis: Resolved. 5.   Hypokalemia: Resolved.  Continue new oral potassium supplementation. 6.  Weight loss: Patient has declined dietary referral.  Continue dexamethasone 7.  Leukopenia/neutropenia: Improved.  Proceed with treatment as above.   Patient expressed understanding and was in agreement with this plan. She also understands that She can call clinic at any time with any questions, concerns, or complaints.   Cancer Staging Malignant neoplasm of upper-outer quadrant of right breast in female, estrogen receptor positive ([place]) Staging form: Breast, AJCC 8th Edition - Clinical stage from 11/06/2018: Stage IA (cT1a, cN0, cM0, G1, ER+, PR+, HER2-) - Signed by [name], MD on 11/06/2018 Histologic grading system: 3 grade system  Squamous cell carcinoma of right lung [facility]) Staging form: Lung, AJCC 8th Edition - Pathologic stage from 08/05/2020: Stage IIIA (pT3, pN1, cM0) - Signed by [name], MD on 08/05/2020 Stage prefix: Initial diagnosis   [name], MD   10/22/2020 4:40 PM

## 2020-10-22 NOTE — TOC Transition Note (Signed)
Transition of Care [facility]) - CM/SW Discharge Note   Patient Details  Name: DONETTE MAINWARING MRN: [id number] Date of Birth: 1933/11/23  [facility]) CM/SW Contact:  Shelbie Hutching, RN Phone Number: 10/22/2020, 10:13 AM   Clinical Narrative:    Patient will discharge to [facility] today going to room 608 A.  Bedside RN will call report to [phone].  Once discharge is completed and COVID test is back RNCM will arrange EMS transport.  Attempted to notify family, message left for return call for Vivien Rota.     Final next level of care: Skilled Nursing Facility Barriers to Discharge: Barriers Resolved   Patient Goals and CMS Choice Patient states their goals for this hospitalization and ongoing recovery are:: to go to SNF CMS Medicare.gov Compare Post Acute Care list provided to:: Patient Choice offered to / list presented to : Patient  Discharge Placement              Patient chooses bed at: [facility] [place] Patient to be transferred to facility by: [facility] Name of family member notified: Vivien Rota Patient and family notified of of transfer: 10/22/20  Discharge Plan and Services                DME Arranged: N/A         HH Arranged: NA          Social Determinants of Health (SDOH) Interventions     Readmission Risk Interventions No flowsheet data found.

## 2020-10-22 NOTE — Progress Notes (Signed)
Pt discharged to facility via ems, PVL removed, pt belongings taken with pt.  Flowers left behind and will be picked up tomorrow 10/23/2020.

## 2020-10-22 NOTE — TOC Progression Note (Signed)
Transition of Care [facility]) - Progression Note    Patient Details  Name: [name] MRN: [id number] Date of Birth: 1933-06-24  Transition of Care [facility]) CM/SW Contact  Shelbie Hutching, RN Phone Number: 10/22/2020, 2:02 PM  Clinical Narrative:    EMS arranged for transport.  Patient is 2nd on the list for pick up.    Expected Discharge Plan: Skilled Nursing Facility Barriers to Discharge: Barriers Resolved  Expected Discharge Plan and Services Expected Discharge Plan: [name]       Living arrangements for the past 2 months: Single Family Home Expected Discharge Date: 10/22/20               DME Arranged: N/A         HH Arranged: NA           Social Determinants of Health (SDOH) Interventions    Readmission Risk Interventions No flowsheet data found.

## 2020-10-25 ENCOUNTER — Inpatient Hospital Stay: Payer: Medicare HMO | Admitting: Oncology

## 2020-10-25 ENCOUNTER — Inpatient Hospital Stay: Payer: Medicare HMO

## 2020-10-28 ENCOUNTER — Ambulatory Visit (INDEPENDENT_AMBULATORY_CARE_PROVIDER_SITE_OTHER): Payer: Medicare HMO | Admitting: Physician Assistant

## 2020-10-28 ENCOUNTER — Other Ambulatory Visit: Payer: Self-pay

## 2020-10-28 ENCOUNTER — Encounter: Payer: Self-pay | Admitting: Physician Assistant

## 2020-10-28 VITALS — BP 120/78 | HR 70 | Ht 62.0 in | Wt 135.0 lb

## 2020-10-28 DIAGNOSIS — I119 Hypertensive heart disease without heart failure: Secondary | ICD-10-CM

## 2020-10-28 DIAGNOSIS — E785 Hyperlipidemia, unspecified: Secondary | ICD-10-CM

## 2020-10-28 DIAGNOSIS — N1831 Chronic kidney disease, stage 3a: Secondary | ICD-10-CM

## 2020-10-28 DIAGNOSIS — Z87440 Personal history of urinary (tract) infections: Secondary | ICD-10-CM

## 2020-10-28 DIAGNOSIS — C3490 Malignant neoplasm of unspecified part of unspecified bronchus or lung: Secondary | ICD-10-CM

## 2020-10-28 DIAGNOSIS — Z950 Presence of cardiac pacemaker: Secondary | ICD-10-CM

## 2020-10-28 DIAGNOSIS — I471 Supraventricular tachycardia: Secondary | ICD-10-CM | POA: Diagnosis not present

## 2020-10-28 DIAGNOSIS — I442 Atrioventricular block, complete: Secondary | ICD-10-CM

## 2020-10-28 DIAGNOSIS — I2721 Secondary pulmonary arterial hypertension: Secondary | ICD-10-CM

## 2020-10-28 DIAGNOSIS — R531 Weakness: Secondary | ICD-10-CM

## 2020-10-28 DIAGNOSIS — R778 Other specified abnormalities of plasma proteins: Secondary | ICD-10-CM

## 2020-10-28 DIAGNOSIS — Z8673 Personal history of transient ischemic attack (TIA), and cerebral infarction without residual deficits: Secondary | ICD-10-CM

## 2020-10-28 DIAGNOSIS — I10 Essential (primary) hypertension: Secondary | ICD-10-CM

## 2020-10-28 NOTE — Patient Instructions (Signed)
Medication Instructions:  No changes, please continue your current medications   If you need a refill on your cardiac medications before your next appointment, please call your pharmacy.   Lab Work: None  Testing/Procedures: None  Follow-Up: At [facility], you and your health needs are our priority.  As part of our continuing mission to provide you with exceptional heart care, we have created designated Provider Care Teams.  These Care Teams include your primary Cardiologist (physician) and Advanced Practice Providers (APPs -  Physician Assistants and Nurse Practitioners) who all work together to provide you with the care you need, when you need it.  Your next appointment:   6 month(s)  The format for your next appointment:   In Person  Provider:   Virl Axe, MD

## 2020-10-28 NOTE — Progress Notes (Addendum)
Office Visit    Patient Name: [name] Date of Encounter: 10/28/2020  PCP:  Philmore Pali, NP   [place]  Cardiologist:  Virl Axe, MD  Advanced Practice Provider:  No care team member to display Electrophysiologist:  None :[id number]}   Chief Complaint    Chief Complaint  Patient presents with   Hospitalization [facility] follow for going to the ED for A-fib. Medications verbally reviewed with patient.    87 y.o. female with history of complete heart block s/p PPM, PAF (noted on device interrogation), hypertension, hyperlipidemia, PAH, CKD 3, GERD, gout, breast cancer, right lung cancer (currently on chemotherapy/radiation), history of CVA, recent 09/2020 admission with cardiology consulted for elevated troponin, and seen today for hospital follow-up.  Past Medical History    Past Medical History:  Diagnosis Date   Breast cancer, right ([place]) 11/2018   Mastectomy   C. difficile colitis    CKD (chronic kidney disease), stage III ([place])    Complete heart block ([place])    a. Originally placed 08/2004; b. 07/2013 s/p [facility] [place] PPM (ser # [id number]).   Dyspnea    Gait abnormality 07/23/2016   GERD (gastroesophageal reflux disease)    Gout    Hemoptysis 07/25/2020   History of hypertension    History of stroke    Hyperhomocysteinemia (H[facility])    Hyperlipidemia    Hypertension    Hypokalemia 07/13/2018   Low blood magnesium 07/13/2018   Lung cancer ([place])    Pacemaker-Medtronic 08/07/2011   PAF (paroxysmal atrial fibrillation) ([place])    a. 07/2020 Device interrogation-->13.3% AF burden-->longest atrial high rate episode 24:26 mins-->AT vs AF. Not on [place].   Pulmonary hypertension ([place])    a. 06/2018 Echo: EF 60-65%, no rwma, nl RV fxn, RVSP 48.69mHg. Mild AoV Ca2+.   Rhabdomyolysis    Stroke ([facility]    left sided weakness, 07/28/20- no residual    Vomiting 07/13/2018   Past Surgical History:  Procedure Laterality Date    BIOPSY  07/15/2018   Procedure: BIOPSY;  Surgeon: GGatha Mayer MD;  Location: [facility] ENDOSCOPY;  Service: Gastroenterology;;   BREAST BIOPSY Right 10/27/2018   affirm bx of mass, x clip,  INVASIVE MAMMARY CARCINOMA   BREAST LUMPECTOMY Right 12/03/2018   BRONCHIAL BIOPSY  07/29/2020   Procedure: BRONCHIAL BIOPSIES;  Surgeon: OLaurin Coder MD;  [place] [facility]  Service: Pulmonary;;   BRONCHIAL NEEDLE ASPIRATION BIOPSY  07/29/2020   Procedure: BRONCHIAL NEEDLE ASPIRATION BIOPSIES;  Surgeon: OLaurin Coder MD;  [place] [facility]  Service: Pulmonary;;   CARDIAC CATHETERIZATION     ENDOBRONCHIAL ULTRASOUND N/A 07/29/2020   Procedure: ENDOBRONCHIAL ULTRASOUND;  Surgeon: OLaurin Coder MD;  [place] [facility]  Service: Pulmonary;  Laterality: N/A;   ESOPHAGOGASTRODUODENOSCOPY Left 07/15/2018   Procedure: ESOPHAGOGASTRODUODENOSCOPY (EGD);  Surgeon: GGatha Mayer MD;  [facility];  Service: Gastroenterology;  Laterality: Left;   HEMOSTASIS CONTROL  07/29/2020   Procedure: HEMOSTASIS CONTROL;  Surgeon: OLaurin Coder MD;  Location: [facility];  Service: Pulmonary;;   PACEMAKER INSERTION     Medtronic Enpulse dual-chamber pacemaker   PARTIAL MASTECTOMY WITH NEEDLE LOCALIZATION AND AXILLARY SENTINEL LYMPH NODE BX Right 12/03/2018   Procedure: PARTIAL MASTECTOMY WITH NEEDLE LOCALIZATION AND AXILLARY SENTINEL LYMPH NODE BX, RIGHT;  Surgeon: CHerbert Pun MD;  Location: [facility] ORS;  Service: General;  Laterality: Right;   PERMANENT PACEMAKER GENERATOR CHANGE N/A 07/30/2013  Procedure: PERMANENT PACEMAKER GENERATOR CHANGE;  Surgeon: [name], MD;  [facility]: [facility] C[facility] LAB;  Service: Cardiovascular;  Laterality: N/A;   PORTA CATH INSERTION N/A 08/16/2020   Procedure: PORTA CATH INSERTION;  Surgeon: Katha Cabal, MD;  Location: [place] CV LAB;  Service: Cardiovascular;  Laterality: N/A;   VIDEO BRONCHOSCOPY N/A 07/29/2020   Procedure: VIDEO  BRONCHOSCOPY WITH FLUORO;  Surgeon: Laurin Coder, MD;  Location: [place] ENDOSCOPY;  Service: Pulmonary;  Laterality: N/A;    Allergies  No Known Allergies  History of Present Illness    [name] is a 87 y.o. female with PMH as above.  She has history of complete heart block s/p PPM, PAF as noted on device interrogation, hypertension, hyperlipidemia, PAH, GERD, CKD 3, gout, breast cancer, right lung cancer on chemotherapy and radiation, and CVA.  She underwent PPM placement 08/2004 in the setting of [facility].  Most recent generator change out was 07/2013.  Device interrogation has shown occasional high atrial rate, suggestive of PAF.  07/2020 interrogation showed stable AF burden at 13.3% with longest episode 24 months.  She has not been anticoagulated.  Echo 06/2018 showed EF 60 to 65%, NR WMA, normal RV function with RVSP 48.4 mmHg, and mild aortic valve calcification.  She has been followed closely by oncology in the setting of right lung cancer and receiving chemotherapy treatment every Tuesday for the past 6 weeks with daily XRT.  Per oncology note, she has chronic weakness and fatigue.  She received chemotherapy treatment 6/14 with increased urinary frequency and dysuria at that time.  UA returned abnormal with culture showing P mirabilis on 6/16 for which she was treated with levofloxacin.  Unfortunately, between 6/14 and 6/16, she had progressive fatigue and leg weakness.  On 6/16, she fell while walking down stairs in front of her house and scraped her left knee.  Because of this, she skipped XRT on 6/16 but did present for treatment 6/17, after which time she felt unsteady.  EMS was called by family later that day.  In the ED, she was hemodynamically stable and afebrile.  PPM interrogation normal.  Labs showed mildly elevated troponin at 103, 87, 56.  She denies chest pain.  Creatinine BUN elevated above baseline.  Head CT and chest x-ray without acute findings.  Elevated troponin was thought  to reflect demand ischemia.  Echo showed LVEF low normal with recommendation to defer ischemia testing, given her comorbid conditions.  Pacemaker interrogation was reviewed by branding cardiologist with multiple episodes of mode switching.  It was thought that atrial rates were most consistent with SVT/PAT, though at least one episode of atrial rate of approximately 400 could represent atrial fibrillation.  Ventricular rates were well controlled in the setting of complete heart block.  Amlodipine was changed to diltiazem.  She remained a suboptimal candidate for anticoagulation with recommendation to continue to defer anticoagulation, particularly given paucity of evidence for atrial fibrillation.  Recommendation was for follow-up in 2 to 4 weeks after discharge.   Today, 10/28/2020, she presents to clinic and notes that she is overall feeling about her usual baseline.  She is seated in a wheelchair today and enjoyed by an aide.  She denies any chest pain or shortness of breath.  No presyncope or syncope.  She notes ongoing weakness and fatigue.  Cough noted on exam with consideration of her lung cancer.  She denies any signs or symptoms of volume overload.  No signs or symptoms of bleeding.  Medication compliance confirmed by aide.  Home Medications   Current Outpatient Medications  Medication Instructions   acetaminophen (TYLENOL) 500-1,000 mg, Oral, Every 6 hours PRN   alendronate (FOSAMAX) 70 mg, Oral, Weekly, Take with a full glass of water on an empty stomach.   allopurinol (ZYLOPRIM) 100 mg, Oral, Daily   atorvastatin (LIPITOR) 20 mg, Oral, Daily   Blood Pressure KIT Automated blood pressure measuring device.   clopidogrel (PLAVIX) 75 mg, Oral, Daily   dexamethasone (DECADRON) 4 mg, Oral, Daily   dextromethorphan-guaiFENesin (MUCINEX DM) 30-600 MG 12hr tablet 1 tablet, Oral, 2 times daily   diltiazem (CARDIZEM CD) 120 mg, Oral, Daily   docusate sodium (COLACE) 100 mg, Oral, 2 times daily PRN    feeding supplement (ENSURE ENLIVE / ENSURE PLUS) LIQD 237 mLs, Oral, 3 times daily between meals   letrozole (FEMARA) 2.5 MG tablet TAKE 1 TABLET BY MOUTH EVERY DAY   lidocaine-prilocaine (EMLA) cream Apply to affected area once   LUMIGAN 0.01 % SOLN 1 drop, Both Eyes, Daily at bedtime   mirtazapine (REMERON) 7.5 mg, Oral, Daily at bedtime   Multiple Vitamin (MULTIVITAMIN WITH MINERALS) TABS tablet 1 tablet, Oral, Daily   ondansetron (ZOFRAN) 8 mg, Oral, 2 times daily PRN   pantoprazole (PROTONIX) 40 mg, Oral, Daily   prochlorperazine (COMPAZINE) 10 mg, Oral, Every 6 hours PRN     Review of Systems    She denies chest pain, palpitations, dyspnea, pnd, orthopnea, n, v, dizziness, syncope, edema, weight gain, or early satiety.  She reports ongoing weakness and fatigue.   Cough noted on exam. All other systems reviewed and are otherwise negative except as noted above.  Physical Exam    VS:  BP 120/78 (BP Location: Left Arm, Patient Position: Sitting, Cuff Size: Normal)   Pulse 70   Ht 5' 2"  (1.575 m)   Wt 135 lb (61.2 kg)   SpO2 99%   BMI 24.69 kg/m  , BMI Body mass index is 24.69 kg/m. GEN: Well nourished, well developed, in no acute distress.  Seated in wheelchair.  Joined by her aide. HEENT: normal. Neck: Supple, no JVD, carotid bruits, or masses. Cardiac: paced irregular rhythm with controlled rate, no murmurs, rubs, or gallops. No clubbing, cyanosis, edema.  Radials/DP/PT 2+ and equal bilaterally.  Respiratory: Poor inspiratory effort, CTAB. GI: Soft, nontender, nondistended, BS + x 4. MS: no deformity or atrophy. Skin: warm and dry, no rash. Neuro:  Strength and sensation are intact. Psych: Normal affect.  Accessory Clinical Findings    ECG personally reviewed by me today - A sensed V paced with at times AV paced rhythm, 70bpm,- no acute changes.  VITALS Reviewed today   Temp Readings from Last 3 Encounters:  10/22/20 98.4 F (36.9 C)  10/11/20 97.9 F (36.6 C)  (Tympanic)  09/27/20 (!) 97.2 F (36.2 C)   BP Readings from Last 3 Encounters:  10/28/20 120/78  10/22/20 123/75  10/11/20 105/72   Pulse Readings from Last 3 Encounters:  10/28/20 70  10/22/20 60  10/11/20 60    Wt Readings from Last 3 Encounters:  10/28/20 135 lb (61.2 kg)  10/17/20 138 lb 14.2 oz (63 kg)  10/11/20 131 lb (59.4 kg)     LABS  reviewed today   Lab Results  Component Value Date   WBC 2.9 (L) 10/19/2020   HGB 9.9 (L) 10/19/2020   HCT 30.3 (L) 10/19/2020   MCV 81.9 10/19/2020   PLT 73 (L) 10/19/2020   Lab Results  Component Value Date   CREATININE 0.55 10/17/2020   BUN 19 10/17/2020   NA 133 (L) 10/17/2020   K 3.8 10/17/2020   CL 103 10/17/2020   CO2 23 10/17/2020   Lab Results  Component Value Date   ALT 37 10/17/2020   AST 33 10/17/2020   ALKPHOS 37 (L) 10/17/2020   BILITOT 0.7 10/17/2020   Lab Results  Component Value Date   CHOL 111 10/16/2020   HDL 28 (L) 10/16/2020   LDLCALC 52 10/16/2020   TRIG 155 (H) 10/16/2020   CHOLHDL 4.0 10/16/2020    Lab Results  Component Value Date   HGBA1C 5.6 10/15/2020   Lab Results  Component Value Date   TSH 0.716 07/13/2018     STUDIES/PROCEDURES reviewed today   2D Echocardiogram 6.18.2022 1. Left ventricular ejection fraction, by estimation, is 50 %. The left  ventricle has low normal function. The left ventricle demonstrates  regional wall motion abnormalities (septal wall motion abnormality,  possibly secondary to paced rhythym). Left  ventricular diastolic parameters are indeterminate.   2. Right ventricular systolic function was not well visualized. The right  ventricular size is normal.  Assessment & Plan    Elevated HS Tn -- Denies chest pain.  Presented 6/17 with progressive weakness/fatigue after fall 6/16.  She is found to have a UTI earlier in the week.  High-sensitivity troponin minimally elevated, flat trending.  EKG without acute ST/T changes.  Interrogation thought  consistent with atrial tachycardia.  Echo EF 50% with septal wall motion abnormality, thought possibly 2/2 paced rhythm.  High-sensitivity troponin likely 2/2 demand ischemia in the setting of her comorbid conditions, including dehydration, UTI, and chemoradiation.  No antiplatelet therapy recommended in setting of thrombocytopenia but restarted before discharge.  Continue current statin.  Given comorbid conditions without CP or concerning sx for angina, and given HS Tn thought likely 2/2 demand ischemia, we will continue to defer Lexiscan Myoview at this time and as recommended at discharge by rounding team.    PAF/PAT - Interrogation of device has shown atrial tachycardia / SVT but at times to be consistent with atrial fibrillation.  In the setting of complete heart block, the rate are well controlled.  Amlodipine changed to diltiazem with patient report that she is tolerating this well.  Continue diltiazem. Echo EF 50%.  As noted in HPI above, no recommendation for anticoagulation at this time and with consideration of comorbid's.   PAH - Euvolemic on exam and denies any symptoms of volume overload.  HCTZ held during admission and at discharge with BP controlled and pt euvolemic. RVSP 48.4 on echo in 2020.  RV not well visualized on admission echo.  Will continue to hold HCTZ given euvolemic and BP controlled with recent UTI.   Complete heart block --S/p PPM dating back to 2006th grade in 2015.  Normal device function.  Essential hypertension - Continue diltiazem. HCTZ discontinued at discharge.  Hyperlipidemia - Continue statin.  Acute on chronic kidney disease --No diuretic recommended given bump in renal function during admission and UTI/renally processed antibiotics.  HCTZ discontinued at discharge. Continue to reassess volume status with daily weights and BP checks at her facility. Caution with nephrotoxins.   Pancytopenia/lung cancer --Per IM/heme-onc.  History of CVA --No  antiplatelet therapy recommended in the setting of thrombocytopenia.  Weakness/fatigue Recent urinary tract infection --Weakness and fatigue in the setting of ongoing recent diagnosis of UTI with P mirabilis UTI diagnosis 6/16 and continued on antibiotics per IM.  Disposition: Follow-up with Dr. Caryl Comes 3-6 months, sooner if needed  *Please be aware that the above documentation was completed voice recognition software and may contain dictation errors.     Arvil Chaco, PA-C 10/28/2020

## 2020-11-07 ENCOUNTER — Emergency Department: Payer: Medicare HMO

## 2020-11-07 ENCOUNTER — Other Ambulatory Visit: Payer: Self-pay

## 2020-11-07 ENCOUNTER — Observation Stay
Admission: EM | Admit: 2020-11-07 | Discharge: 2020-11-09 | Disposition: A | Discharge status: Home / Self Care | Payer: Medicare HMO | Attending: Internal Medicine | Admitting: Internal Medicine

## 2020-11-07 DIAGNOSIS — Z87891 Personal history of nicotine dependence: Secondary | ICD-10-CM | POA: Insufficient documentation

## 2020-11-07 DIAGNOSIS — I2510 Atherosclerotic heart disease of native coronary artery without angina pectoris: Secondary | ICD-10-CM | POA: Diagnosis not present

## 2020-11-07 DIAGNOSIS — Z79899 Other long term (current) drug therapy: Secondary | ICD-10-CM | POA: Diagnosis not present

## 2020-11-07 DIAGNOSIS — N1831 Chronic kidney disease, stage 3a: Secondary | ICD-10-CM | POA: Insufficient documentation

## 2020-11-07 DIAGNOSIS — Z7902 Long term (current) use of antithrombotics/antiplatelets: Secondary | ICD-10-CM | POA: Insufficient documentation

## 2020-11-07 DIAGNOSIS — Z20822 Contact with and (suspected) exposure to covid-19: Secondary | ICD-10-CM | POA: Diagnosis not present

## 2020-11-07 DIAGNOSIS — Z8673 Personal history of transient ischemic attack (TIA), and cerebral infarction without residual deficits: Secondary | ICD-10-CM | POA: Insufficient documentation

## 2020-11-07 DIAGNOSIS — R531 Weakness: Secondary | ICD-10-CM

## 2020-11-07 DIAGNOSIS — Z853 Personal history of malignant neoplasm of breast: Secondary | ICD-10-CM | POA: Insufficient documentation

## 2020-11-07 DIAGNOSIS — I129 Hypertensive chronic kidney disease with stage 1 through stage 4 chronic kidney disease, or unspecified chronic kidney disease: Secondary | ICD-10-CM | POA: Diagnosis not present

## 2020-11-07 DIAGNOSIS — E43 Unspecified severe protein-calorie malnutrition: Secondary | ICD-10-CM | POA: Diagnosis not present

## 2020-11-07 DIAGNOSIS — I10 Essential (primary) hypertension: Secondary | ICD-10-CM | POA: Diagnosis present

## 2020-11-07 DIAGNOSIS — D701 Agranulocytosis secondary to cancer chemotherapy: Secondary | ICD-10-CM

## 2020-11-07 DIAGNOSIS — T451X5A Adverse effect of antineoplastic and immunosuppressive drugs, initial encounter: Secondary | ICD-10-CM

## 2020-11-07 DIAGNOSIS — C50411 Malignant neoplasm of upper-outer quadrant of right female breast: Secondary | ICD-10-CM

## 2020-11-07 DIAGNOSIS — C3491 Malignant neoplasm of unspecified part of right bronchus or lung: Secondary | ICD-10-CM | POA: Diagnosis present

## 2020-11-07 DIAGNOSIS — I248 Other forms of acute ischemic heart disease: Secondary | ICD-10-CM

## 2020-11-07 DIAGNOSIS — I4891 Unspecified atrial fibrillation: Secondary | ICD-10-CM | POA: Diagnosis present

## 2020-11-07 DIAGNOSIS — Z950 Presence of cardiac pacemaker: Secondary | ICD-10-CM | POA: Diagnosis not present

## 2020-11-07 DIAGNOSIS — D61810 Antineoplastic chemotherapy induced pancytopenia: Secondary | ICD-10-CM | POA: Diagnosis not present

## 2020-11-07 DIAGNOSIS — Z170 Estrogen receptor positive status [ER+]: Secondary | ICD-10-CM

## 2020-11-07 DIAGNOSIS — I214 Non-ST elevation (NSTEMI) myocardial infarction: Secondary | ICD-10-CM | POA: Diagnosis not present

## 2020-11-07 LAB — RESP PANEL BY RT-PCR (FLU A&B, COVID) ARPGX2
Influenza A by PCR: NEGATIVE
Influenza B by PCR: NEGATIVE
SARS Coronavirus 2 by RT PCR: NEGATIVE

## 2020-11-07 LAB — CBC WITH DIFFERENTIAL/PLATELET
Abs Immature Granulocytes: 0.01 10*3/uL (ref 0.00–0.07)
Basophils Absolute: 0 10*3/uL (ref 0.0–0.1)
Basophils Relative: 1 %
Eosinophils Absolute: 0 10*3/uL (ref 0.0–0.5)
Eosinophils Relative: 0 %
HCT: 37.8 % (ref 36.0–46.0)
Hemoglobin: 12.1 g/dL (ref 12.0–15.0)
Immature Granulocytes: 0 %
Lymphocytes Relative: 8 %
Lymphs Abs: 0.2 10*3/uL — ABNORMAL LOW (ref 0.7–4.0)
MCH: 28.7 pg (ref 26.0–34.0)
MCHC: 32 g/dL (ref 30.0–36.0)
MCV: 89.6 fL (ref 80.0–100.0)
Monocytes Absolute: 0.1 10*3/uL (ref 0.1–1.0)
Monocytes Relative: 5 %
Neutro Abs: 2 10*3/uL (ref 1.7–7.7)
Neutrophils Relative %: 86 %
Platelets: 65 10*3/uL — ABNORMAL LOW (ref 150–400)
RBC: 4.22 MIL/uL (ref 3.87–5.11)
RDW: 23.4 % — ABNORMAL HIGH (ref 11.5–15.5)
WBC: 2.3 10*3/uL — ABNORMAL LOW (ref 4.0–10.5)
nRBC: 0 % (ref 0.0–0.2)

## 2020-11-07 LAB — PROCALCITONIN: Procalcitonin: 0.21 ng/mL

## 2020-11-07 LAB — PROTIME-INR
INR: 1.1 (ref 0.8–1.2)
Prothrombin Time: 14.3 seconds (ref 11.4–15.2)

## 2020-11-07 LAB — BASIC METABOLIC PANEL
Anion gap: 8 (ref 5–15)
BUN: 21 mg/dL (ref 8–23)
CO2: 22 mmol/L (ref 22–32)
Calcium: 8.1 mg/dL — ABNORMAL LOW (ref 8.9–10.3)
Chloride: 104 mmol/L (ref 98–111)
Creatinine, Ser: 0.56 mg/dL (ref 0.44–1.00)
GFR, Estimated: >60 mL/min (ref >60)
Glucose, Bld: 108 mg/dL — ABNORMAL HIGH (ref 70–99)
Potassium: 4.2 mmol/L (ref 3.5–5.1)
Sodium: 134 mmol/L — ABNORMAL LOW (ref 135–145)

## 2020-11-07 LAB — TROPONIN I (HIGH SENSITIVITY)
Troponin I (High Sensitivity): 317 ng/L (ref <18)
Troponin I (High Sensitivity): 297 ng/L (ref <18)

## 2020-11-07 LAB — MAGNESIUM: Magnesium: 1.8 mg/dL (ref 1.7–2.4)

## 2020-11-07 LAB — BRAIN NATRIURETIC PEPTIDE: B Natriuretic Peptide: 151.4 pg/mL — ABNORMAL HIGH (ref 0.0–100.0)

## 2020-11-07 LAB — APTT: aPTT: 27 seconds (ref 24–36)

## 2020-11-07 MED ORDER — SODIUM CHLORIDE 0.9 % IV SOLN
1.0000 g | INTRAVENOUS | Status: DC
  Administered 2020-11-07: 1 g via INTRAVENOUS
  Filled 2020-11-07 (×2): qty 10

## 2020-11-07 MED ORDER — SODIUM CHLORIDE 0.9 % IV SOLN
500.0000 mg | INTRAVENOUS | Status: DC
  Administered 2020-11-08: 500 mg via INTRAVENOUS
  Filled 2020-11-07 (×2): qty 500

## 2020-11-07 MED ORDER — ACETAMINOPHEN 325 MG PO TABS
650.0000 mg | ORAL_TABLET | ORAL | Status: DC | PRN
  Administered 2020-11-09: 650 mg via ORAL
  Filled 2020-11-07: qty 2

## 2020-11-07 MED ORDER — ONDANSETRON HCL 4 MG/2ML IJ SOLN: 4.0000 mg | Freq: Four times a day (QID) | INTRAMUSCULAR | Status: DC | PRN

## 2020-11-07 MED ORDER — CLOPIDOGREL BISULFATE 75 MG PO TABS
75.0000 mg | ORAL_TABLET | Freq: Every day | ORAL | Status: DC
  Administered 2020-11-08 – 2020-11-09 (×2): 75 mg via ORAL
  Filled 2020-11-07 (×2): qty 1

## 2020-11-07 MED ORDER — ATORVASTATIN CALCIUM 20 MG PO TABS
20.0000 mg | ORAL_TABLET | Freq: Every day | ORAL | Status: DC
  Administered 2020-11-08 – 2020-11-09 (×2): 20 mg via ORAL
  Filled 2020-11-07 (×2): qty 1

## 2020-11-07 MED ORDER — HEPARIN (PORCINE) 25000 UT/250ML-% IV SOLN
900.0000 [IU]/h | INTRAVENOUS | Status: DC
  Administered 2020-11-07: 750 [IU]/h via INTRAVENOUS
  Administered 2020-11-09: 900 [IU]/h via INTRAVENOUS
  Filled 2020-11-07 (×2): qty 250

## 2020-11-07 MED ORDER — HEPARIN BOLUS VIA INFUSION
3700.0000 [IU] | Freq: Once | INTRAVENOUS | Status: AC
  Administered 2020-11-07: 3700 [IU] via INTRAVENOUS
  Filled 2020-11-07: qty 3700

## 2020-11-07 MED ORDER — NITROGLYCERIN 0.4 MG SL SUBL: 0.4000 mg | SUBLINGUAL_TABLET | SUBLINGUAL | Status: DC | PRN

## 2020-11-07 MED ORDER — ASPIRIN 81 MG PO CHEW
324.0000 mg | CHEWABLE_TABLET | Freq: Once | ORAL | Status: AC
  Administered 2020-11-07: 324 mg via ORAL
  Filled 2020-11-07: qty 4

## 2020-11-07 NOTE — ED Triage Notes (Signed)
Pt to ER via [facility] from [facility]. Facility reports increased weakness and productive cough over the last two weeks. Pt reports coughing up clear mucus.   [facility]

## 2020-11-07 NOTE — ED Provider Notes (Signed)
[facility] Emergency Department Provider Note   ____________________________________________   Event Date/Time   First MD Initiated Contact with Patient 11/07/20 1754     (approximate)  I have reviewed the triage vital signs and the nursing notes.   HISTORY  Chief Complaint Weakness and Shortness of Breath    HPI [name] is a 87 y.o. female with past medical history of hypertension, hyperlipidemia, stroke, GERD, gout, atrial fibrillation, CAD, CKD, complete heart block status post pacemaker, and breast cancer who presents to the ED complaining of generalized weakness and cough.  Patient is currently residing [facility] for [facility]hab following admission for UTI and tacky dysrhythmia.  EMS states that staff and family have noticed she has been increasingly weak over the past 7 days associated with a productive cough.  Patient denies any fevers, chest pain, or shortness of breath.  She has not noticed any pain or swelling in her legs.  She completed course of antibiotics for UTI almost 2 weeks ago, currently denies any urinary symptoms.        Past Medical History:  Diagnosis Date   Breast cancer, right ([place]) 11/2018   Mastectomy   C. difficile colitis    CKD (chronic kidney disease), stage III (HCC)    Complete heart block ([place])    a. Originally placed 08/2004; b. 07/2013 s/p MDT [place] PPM (ser # [id number]).   Dyspnea    Gait abnormality 07/23/2016   GERD (gastroesophageal reflux disease)    Gout    Hemoptysis 07/25/2020   History of hypertension    History of stroke    Hyperhomocysteinemia (HCC)    Hyperlipidemia    Hypertension    Hypokalemia 07/13/2018   Low blood magnesium 07/13/2018   Lung cancer ([place])    Pacemaker-Medtronic 08/07/2011   PAF (paroxysmal atrial fibrillation) ([place])    a. 07/2020 Device interrogation-->13.3% AF burden-->longest atrial high rate episode 24:26 mins-->AT vs AF. Not on [place].   Pulmonary  hypertension ([place])    a. 06/2018 Echo: EF 60-65%, no rwma, nl RV fxn, RVSP 48.15mHg. Mild AoV Ca2+.   Rhabdomyolysis    Stroke ([facility]    left sided weakness, 07/28/20- no residual    Vomiting 07/13/2018    Patient Active Problem List   Diagnosis Date Noted   NSTEMI (non-ST elevated myocardial infarction) ([place] 11/07/2020   Leukopenia due to antineoplastic chemotherapy ([place] 11/07/2020   Protein-calorie malnutrition, severe 10/17/2020   Weakness 10/15/2020   UTI (urinary tract infection) 10/15/2020   Fall at home, initial encounter 10/15/2020   CKD (chronic kidney disease), stage IIIa 10/15/2020   Hyperkalemia 10/15/2020   Hypercalcemia 10/15/2020   Severe sepsis ([place] 10/15/2020   Squamous cell carcinoma of right lung ([place] 08/05/2020   Hemoptysis 07/25/2020   Malignant neoplasm of upper-outer quadrant of right breast in female, estrogen receptor positive ([facility] 10/31/2018   Enteritis due to Clostridium difficile    Malnutrition of moderate degree 07/15/2018   Cough    Ileus (HCC)    Low blood magnesium 07/13/2018   Vomiting 07/13/2018   Loss of weight 06/27/2018   Rhabdomyolysis 05/26/2018   History of CVA (cerebrovascular accident) 05/26/2018   Hyperlipidemia 05/26/2018   AKI (acute kidney injury) ([place] 05/26/2018   Transaminitis 05/26/2018   Elevated troponin 05/26/2018   Gait abnormality 07/23/2016   Essential hypertension 06/11/2016   Hypertensive urgency 06/11/2016   CAD (coronary artery disease) 06/11/2016   Pacemaker-Medtronic 08/07/2011   Atrial fibrillation ([place]  08/07/2011   Daytime somnolence 08/07/2011   HYPERTENSION, HEART CONTROLLED W/O ASSOC CHF 06/07/2010   AV BLOCK, COMPLETE 06/07/2010   Cerebral artery occlusion with cerebral infarction (HCC) 06/07/2010    Past Surgical History:  Procedure Laterality Date   BIOPSY  07/15/2018   Procedure: BIOPSY;  Surgeon: Gessner, Carl E, MD;  Location: [facility];  Service: Gastroenterology;;   BREAST BIOPSY Right  10/27/2018   affirm bx of mass, x clip,  INVASIVE MAMMARY CARCINOMA   BREAST LUMPECTOMY Right 12/03/2018   BRONCHIAL BIOPSY  07/29/2020   Procedure: BRONCHIAL BIOPSIES;  Surgeon: Olalere, Adewale A, MD;  [facility]: [facility];  Service: Pulmonary;;   BRONCHIAL NEEDLE ASPIRATION BIOPSY  07/29/2020   Procedure: BRONCHIAL NEEDLE ASPIRATION BIOPSIES;  Surgeon: Olalere, Adewale A, MD;  Location: [facility];  Service: Pulmonary;;   CARDIAC CATHETERIZATION     ENDOBRONCHIAL ULTRASOUND N/A 07/29/2020   Procedure: ENDOBRONCHIAL ULTRASOUND;  Surgeon: Olalere, Adewale A, MD;  Location: [facility];  Service: Pulmonary;  Laterality: N/A;   ESOPHAGOGASTRODUODENOSCOPY Left 07/15/2018   Procedure: ESOPHAGOGASTRODUODENOSCOPY (EGD);  Surgeon: Gessner, Carl E, MD;  Location: [facility];  Service: Gastroenterology;  Laterality: Left;   HEMOSTASIS CONTROL  07/29/2020   Procedure: HEMOSTASIS CONTROL;  Surgeon: Olalere, Adewale A, MD;  [facility]: [facility];  Service: Pulmonary;;   PACEMAKER INSERTION     Medtronic Enpulse dual-chamber pacemaker   PARTIAL MASTECTOMY WITH NEEDLE LOCALIZATION AND AXILLARY SENTINEL LYMPH NODE BX Right 12/03/2018   Procedure: PARTIAL MASTECTOMY WITH NEEDLE LOCALIZATION AND AXILLARY SENTINEL LYMPH NODE BX, RIGHT;  Surgeon: Cintron-Diaz, Edgardo, MD;  Location: [facility] ORS;  Service: General;  Laterality: Right;   PERMANENT PACEMAKER GENERATOR CHANGE N/A 07/30/2013   Procedure: PERMANENT PACEMAKER GENERATOR CHANGE;  Surgeon: Steven C Klein, MD;  [facility]: [facility];  Service: Cardiovascular;  Laterality: N/A;   PORTA CATH INSERTION N/A 08/16/2020   Procedure: PORTA CATH INSERTION;  Surgeon: Schnier, Gregory G, MD;  Location: [facility] INVASIVE CV LAB;  Service: Cardiovascular;  Laterality: N/A;   VIDEO BRONCHOSCOPY N/A 07/29/2020   Procedure: VIDEO BRONCHOSCOPY WITH FLUORO;  Surgeon: Olalere, Adewale A, MD;  Location: [facility];  Service: Pulmonary;  Laterality: N/A;    Prior to Admission  medications   Medication Sig Start Date End Date Taking? Authorizing Provider  acetaminophen (TYLENOL) 500 MG tablet Take 500-1,000 mg by mouth every 6 (six) hours as needed for moderate pain.     [provider]  alendronate (FOSAMAX) 70 MG tablet TAKE 1 TABLET (70 MG TOTAL) BY MOUTH ONCE A WEEK. TAKE WITH A FULL GLASS OF WATER ON AN EMPTY STOMACH. 03/14/20   Finnegan, Timothy J, MD  allopurinol (ZYLOPRIM) 100 MG tablet Take 100 mg by mouth daily. 11/28/19   [provider]  atorvastatin (LIPITOR) 20 MG tablet Take 1 tablet (20 mg total) by mouth daily. 10/18/20 10/18/21  Patel, Pranav M, MD  Blood Pressure KIT Automated blood pressure measuring device. 06/29/18   Dahal, Binaya, MD  clopidogrel (PLAVIX) 75 MG tablet Take 75 mg by mouth daily.    [provider]  dexamethasone (DECADRON) 4 MG tablet Take 1 tablet (4 mg total) by mouth daily. 09/27/20   Finnegan, Timothy J, MD  dextromethorphan-guaiFENesin (MUCINEX DM) 30-600 MG 12hr tablet Take 1 tablet by mouth 2 (two) times daily. 10/18/20   Patel, Pranav M, MD  diltiazem (CARDIZEM CD) 120 MG 24 hr capsule Take 1 capsule (120 mg total) by mouth daily. 10/19/20   Patel, Pranav M, MD  docusate sodium (  COLACE) 100 MG capsule Take 100 mg by mouth 2 (two) times daily as needed for mild constipation.    [provider]  feeding supplement (ENSURE ENLIVE / ENSURE PLUS) LIQD Take 237 mLs by mouth 3 (three) times daily between meals. 10/18/20   Patel, Pranav M, MD  letrozole (FEMARA) 2.5 MG tablet TAKE 1 TABLET BY MOUTH EVERY DAY 09/30/19   Finnegan, Timothy J, MD  lidocaine-prilocaine (EMLA) cream Apply to affected area once 08/17/20   Finnegan, Timothy J, MD  LUMIGAN 0.01 % SOLN Place 1 drop into both eyes at bedtime. 01/13/18   [provider]  mirtazapine (REMERON) 7.5 MG tablet Take 7.5 mg by mouth at bedtime. 10/16/20   [provider]  Multiple Vitamin (MULTIVITAMIN WITH MINERALS) TABS tablet Take 1 tablet by  mouth daily. 10/19/20   Patel, Pranav M, MD  ondansetron (ZOFRAN) 8 MG tablet Take 1 tablet (8 mg total) by mouth 2 (two) times daily as needed for refractory nausea / vomiting. 08/17/20   Finnegan, Timothy J, MD  pantoprazole (PROTONIX) 40 MG tablet Take 40 mg by mouth daily.    [provider]  prochlorperazine (COMPAZINE) 10 MG tablet Take 1 tablet (10 mg total) by mouth every 6 (six) hours as needed (Nausea or vomiting). 08/17/20   Finnegan, Timothy J, MD    Allergies Patient has no known allergies.  Family History  Problem Relation Age of Onset   Hypertension Mother    Breast cancer Sister    Breast cancer Sister    Colon cancer Neg Hx    Esophageal cancer Neg Hx     Social History Social History   Tobacco Use   Smoking status: Former    Pack years: 0.00    Types: Cigarettes    Quit date: 07/25/2006    Years since quitting: 14.2   Smokeless tobacco: Never  Vaping Use   Vaping Use: Never used  Substance Use Topics   Alcohol use: No   Drug use: No    Review of Systems  Constitutional: No fever/chills.  Positive for generalized weakness. Eyes: No visual changes. ENT: No sore throat. Cardiovascular: Denies chest pain. Respiratory: Denies shortness of breath.  Positive for cough. Gastrointestinal: No abdominal pain.  No nausea, no vomiting.  No diarrhea.  No constipation. Genitourinary: Negative for dysuria. Musculoskeletal: Negative for back pain. Skin: Negative for rash. Neurological: Negative for headaches, focal weakness or numbness.  ____________________________________________   PHYSICAL EXAM:  VITAL SIGNS: ED Triage Vitals  Enc Vitals Group     BP --      Pulse --      Resp 11/07/20 1759 20     Temp --      Temp src --      SpO2 11/07/20 1759 99 %     Weight 11/07/20 1800 136 lb 11 oz (62 kg)     Height 11/07/20 1800 5' 2" (1.575 m)     Head Circumference --      [place] --      Pain Score 11/07/20 1759 0     Pain Loc --      Pain Edu?  --      Excl. in GC? --     Constitutional: Alert and oriented. Eyes: Conjunctivae are normal. Head: Atraumatic. Nose: No congestion/rhinnorhea. Mouth/Throat: Mucous membranes are moist. Neck: Normal ROM Cardiovascular: Normal rate, regular rhythm. Grossly normal heart sounds.  2+ radial pulses bilaterally. Respiratory: Mildly tachypneic with normal respiratory effort.  No   retractions. Lungs crackles and rhonchi to left base, no wheezing noted. Gastrointestinal: Soft and nontender. No distention. Genitourinary: deferred Musculoskeletal: No lower extremity tenderness nor edema. Neurologic:  Normal speech and language. No gross focal neurologic deficits are appreciated. Skin:  Skin is warm, dry and intact. No rash noted. Psychiatric: Mood and affect are normal. Speech and behavior are normal.  ____________________________________________   LABS (all labs ordered are listed, but only abnormal results are displayed)  Labs Reviewed  CBC WITH DIFFERENTIAL/PLATELET - Abnormal; Notable for the following components:      Result Value   WBC 2.3 (*)    RDW 23.4 (*)    Platelets 65 (*)    Lymphs Abs 0.2 (*)    All other components within normal limits  BASIC METABOLIC PANEL - Abnormal; Notable for the following components:   Sodium 134 (*)    Glucose, Bld 108 (*)    Calcium 8.1 (*)    All other components within normal limits  BRAIN NATRIURETIC PEPTIDE - Abnormal; Notable for the following components:   B Natriuretic Peptide 151.4 (*)    All other components within normal limits  TROPONIN I (HIGH SENSITIVITY) - Abnormal; Notable for the following components:   Troponin I (High Sensitivity) 317 (*)    All other components within normal limits  RESP PANEL BY RT-PCR (FLU A&B, COVID) ARPGX2  MAGNESIUM  APTT  PROTIME-INR  HEPARIN LEVEL (UNFRACTIONATED)  CBC  TROPONIN I (HIGH SENSITIVITY)   ____________________________________________  EKG  ED ECG REPORT I,  , the  attending physician, personally viewed and interpreted this ECG.   Date: 11/07/2020  EKG Time: 18:05  Rate: 61  Rhythm: normal sinus rhythm  Axis: Normal  Intervals: Prolonged QT  ST&T Change: None   PROCEDURES  Procedure(s) performed (including Critical Care):  .Critical Care  Date/Time: 11/07/2020 9:03 PM Performed by: , , MD Authorized by: , , MD   Critical care provider statement:    Critical care time (minutes):  45   Critical care time was exclusive of:  Separately billable procedures and treating other patients and teaching time   Critical care was necessary to treat or prevent imminent or life-threatening deterioration of the following conditions:  Cardiac failure   Critical care was time spent personally by me on the following activities:  Discussions with consultants, evaluation of patient's response to treatment, examination of patient, ordering and performing treatments and interventions, ordering and review of laboratory studies, ordering and review of radiographic studies, pulse oximetry, re-evaluation of patient's condition, obtaining history from patient or surrogate and review of old charts   I assumed direction of critical care for this patient from another provider in my specialty: no     Care discussed with: admitting provider     ____________________________________________   INITIAL IMPRESSION / ASSESSMENT AND PLAN / ED COURSE      87-year-old female with past medical history of hypertension, hyperlipidemia, stroke, GERD, gout, atrial fibrillation, CAD, CKD, complete heart block status post pacemaker, and breast cancer who presents to the ED with productive cough and increasing weakness over the past 7 days.  Patient is mildly tachypneic but not in any respiratory distress, maintaining O2 sats on room air.  We will screen EKG, chest x-ray, and labs.  EKG with no ischemic changes and low suspicion for ACS or PE at this time.  Chest  x-ray reviewed by me and shows atelectasis versus infiltrate, lower suspicion for pneumonia at this time given unchanged chronic leukopenia and lack   of fever.  Patient does have elevated troponin concerning for ACS, we will give loading dose of aspirin and start on heparin.  Case discussed with hospitalist for admission.      ____________________________________________   FINAL CLINICAL IMPRESSION(S) / ED DIAGNOSES  Final diagnoses:  NSTEMI (non-ST elevated myocardial infarction) (HCC)  Generalized weakness     ED Discharge Orders     None        Note:  This document was prepared using Dragon voice recognition software and may include unintentional dictation errors.    , , MD 11/07/20 2104  

## 2020-11-07 NOTE — ED Notes (Signed)
Pt declined port access by this RN - preferred peripheral IV.

## 2020-11-07 NOTE — Consult Note (Signed)
ANTICOAGULATION CONSULT NOTE - Initial Consult  Pharmacy Consult for heparin infusion  Indication: chest pain/ACS  No Known Allergies  Patient Measurements: Height: 5\' 2"  (157.5 cm) Weight: 62 kg (136 lb 11 oz) IBW/kg (Calculated) : 50.1 Heparin Dosing Weight: 62 kg   Vital Signs: Temp: 98.2 F (36.8 C) (07/11 1805) BP: 121/75 (07/11 1801) Pulse Rate: 61 (07/11 1805)  Labs: Recent Labs    11/07/20 1925  HGB 12.1  HCT 37.8  PLT 65*  CREATININE 0.56  TROPONINIHS 317*    Estimated Creatinine Clearance: 44.6 mL/min (by C-G formula based on SCr of 0.56 mg/dL).   Medical History: Past Medical History:  Diagnosis Date   Breast cancer, right ([place]) 11/2018   Mastectomy   C. difficile colitis    CKD (chronic kidney disease), stage III ([place])    Complete heart block ([place])    a. Originally placed 08/2004; b. 07/2013 s/p MDT [place] PPM (ser # [id number]).   Dyspnea    Gait abnormality 07/23/2016   GERD (gastroesophageal reflux disease)    Gout    Hemoptysis 07/25/2020   History of hypertension    History of stroke    Hyperhomocysteinemia (HCC)    Hyperlipidemia    Hypertension    Hypokalemia 07/13/2018   Low blood magnesium 07/13/2018   Lung cancer ([place])    Pacemaker-Medtronic 08/07/2011   PAF (paroxysmal atrial fibrillation) ([place])    a. 07/2020 Device interrogation-->13.3% AF burden-->longest atrial high rate episode 24:26 mins-->AT vs AF. Not on [place].   Pulmonary hypertension ([place])    a. 06/2018 Echo: EF 60-65%, no rwma, nl RV fxn, RVSP 48.45mmHg. Mild AoV Ca2+.   Rhabdomyolysis    Stroke [facility])    left sided weakness, 07/28/20- no residual    Vomiting 07/13/2018    Medications:  No prior anticoagulation noted   Assessment: 87 y.o female presented with SOB/weakness. History of Afib -- not on anticoagulation. Troponin 317. Pharmacy has been consulted for heparin management for ACS.  Baseline Plt: 65  Goal of Therapy:  Heparin level 0.3-0.7  units/ml Monitor platelets by anticoagulation protocol: Yes   Plan:  Give 3700 units bolus x 1 Start heparin infusion at 750 units/hr Check anti-Xa level in 8 hours and daily while on heparin Continue to monitor H&H and platelets  Dorothe Pea, PharmD, BCPS Clinical Pharmacist   11/07/2020,8:50 PM

## 2020-11-07 NOTE — H&P (Signed)
History and Physical    [name]:[id number] DOB: [birth year] DOA: 11/07/2020  PCP: Philmore Pali, NP   Patient coming from: home  I have personally briefly reviewed patient's old medical records in [place]  Chief Complaint: cough, weakness  HPI: [name] is a 87 y.o. female with medical history significant for CAD, pacemaker secondary to complete heart block, HTN, A. fib not on anticoagulation, CVA, breast cancer status post right mastectomy, non-small cell carcinoma of the lung on chemo and radiation, hospitalized from 6/17-6/25 with UTI and elevated troponin believed secondary to demand from tachyarrhythmia, who presents to the ED with a 1 week history of cough and weakness.  She had no chest pain or shortness of breath no fever or chills.  No orthopnea or lower extremity edema.  Denied abdominal pain, diarrhea or dysuria  ED course: On arrival, afebrile BP 121/75 with pulse of 61 O2 sat 99% on room air.  Blood work with troponin 317, BNP 151.  WBC 2.3 but labs for the most part unremarkable.  Sodium and magnesium normal  EKG, personally viewed and interpreted paced rhythm at 61 with no acute ST-T wave changes  Imaging: Chest x-ray with streaky right basilar opacity, slightly increased from prior.  Findings may reflect atelectasis or infiltrate  Patient started on a heparin infusion for NSTEMI.  Hospitalist consulted for admission.  Review of Systems: As per HPI otherwise all other systems on review of systems negative.    Past Medical History:  Diagnosis Date   Breast cancer, right ([place]) 11/2018   Mastectomy   C. difficile colitis    CKD (chronic kidney disease), stage III (HCC)    Complete heart block ([place])    a. Originally placed 08/2004; b. 07/2013 s/p MDT [place] PPM (ser # [id number]).   Dyspnea    Gait abnormality 07/23/2016   GERD (gastroesophageal reflux disease)    Gout    Hemoptysis 07/25/2020   History of hypertension     History of stroke    Hyperhomocysteinemia (HCC)    Hyperlipidemia    Hypertension    Hypokalemia 07/13/2018   Low blood magnesium 07/13/2018   Lung cancer ([place])    Pacemaker-Medtronic 08/07/2011   PAF (paroxysmal atrial fibrillation) ([place])    a. 07/2020 Device interrogation-->13.3% AF burden-->longest atrial high rate episode 24:26 mins-->AT vs AF. Not on [place].   Pulmonary hypertension ([place])    a. 06/2018 Echo: EF 60-65%, no rwma, nl RV fxn, RVSP 48.36mHg. Mild AoV Ca2+.   Rhabdomyolysis    Stroke ([facility]    left sided weakness, 07/28/20- no residual    Vomiting 07/13/2018    Past Surgical History:  Procedure Laterality Date   BIOPSY  07/15/2018   Procedure: BIOPSY;  Surgeon: GGatha Mayer MD;  Location: [facility];  Service: Gastroenterology;;   BREAST BIOPSY Right 10/27/2018   affirm bx of mass, x clip,  INVASIVE MAMMARY CARCINOMA   BREAST LUMPECTOMY Right 12/03/2018   BRONCHIAL BIOPSY  07/29/2020   Procedure: BRONCHIAL BIOPSIES;  Surgeon: OLaurin Coder MD;  Location: [facility]  Service: Pulmonary;;   BRONCHIAL NEEDLE ASPIRATION BIOPSY  07/29/2020   Procedure: BRONCHIAL NEEDLE ASPIRATION BIOPSIES;  Surgeon: OLaurin Coder MD;  Location: [facility]  Service: Pulmonary;;   CARDIAC CATHETERIZATION     ENDOBRONCHIAL ULTRASOUND N/A 07/29/2020   Procedure: ENDOBRONCHIAL ULTRASOUND;  Surgeon: OLaurin Coder MD;  Location: [facility]  Service: Pulmonary;  Laterality: N/A;   ESOPHAGOGASTRODUODENOSCOPY Left  07/15/2018   Procedure: ESOPHAGOGASTRODUODENOSCOPY (EGD);  Surgeon: [name], MD;  Location: [facility];  Service: Gastroenterology;  Laterality: Left;   HEMOSTASIS CONTROL  07/29/2020   Procedure: HEMOSTASIS CONTROL;  Surgeon: Laurin Coder, MD;  Location: [facility];  Service: Pulmonary;;   PACEMAKER INSERTION     Medtronic Enpulse dual-chamber pacemaker   PARTIAL MASTECTOMY WITH NEEDLE LOCALIZATION AND AXILLARY SENTINEL LYMPH NODE BX Right 12/03/2018    Procedure: PARTIAL MASTECTOMY WITH NEEDLE LOCALIZATION AND AXILLARY SENTINEL LYMPH NODE BX, RIGHT;  Surgeon: [name], MD;  Location: [facility] ORS;  Service: General;  Laterality: Right;   PERMANENT PACEMAKER GENERATOR CHANGE N/A 07/30/2013   Procedure: PERMANENT PACEMAKER GENERATOR CHANGE;  Surgeon: [name], MD;  Location: [facility] CATH LAB;  Service: Cardiovascular;  Laterality: N/A;   PORTA CATH INSERTION N/A 08/16/2020   Procedure: PORTA CATH INSERTION;  Surgeon: Katha Cabal, MD;  Location: [facility] CV LAB;  Service: Cardiovascular;  Laterality: N/A;   VIDEO BRONCHOSCOPY N/A 07/29/2020   Procedure: VIDEO BRONCHOSCOPY WITH FLUORO;  Surgeon: Laurin Coder, MD;  Location: [facility]OSCOPY;  Service: Pulmonary;  Laterality: N/A;     reports that she quit smoking about 14 years ago. Her smoking use included cigarettes. She has never used smokeless tobacco. She reports that she does not drink alcohol and does not use drugs.  No Known Allergies  Family History  Problem Relation Age of Onset   Hypertension Mother    Breast cancer Sister    Breast cancer Sister    Colon cancer Neg Hx    Esophageal cancer Neg Hx       Prior to Admission medications   Medication Sig Start Date End Date Taking? Authorizing Provider  acetaminophen (TYLENOL) 500 MG tablet Take 500-1,000 mg by mouth every 6 (six) hours as needed for moderate pain.     [provider]  alendronate (FOSAMAX) 70 MG tablet TAKE 1 TABLET (70 MG TOTAL) BY MOUTH ONCE A WEEK. TAKE WITH A FULL GLASS OF WATER ON AN EMPTY STOMACH. 03/14/20   [name], MD  allopurinol (ZYLOPRIM) 100 MG tablet Take 100 mg by mouth daily. 11/28/19   [provider]  atorvastatin (LIPITOR) 20 MG tablet Take 1 tablet (20 mg total) by mouth daily. 10/18/20 10/18/21  Lavina Hamman, MD  Blood Pressure KIT Automated blood pressure measuring device. 06/29/18   [name], MD  clopidogrel (PLAVIX) 75 MG tablet Take 75  mg by mouth daily.    [provider]  dexamethasone (DECADRON) 4 MG tablet Take 1 tablet (4 mg total) by mouth daily. 09/27/20   [name], MD  dextromethorphan-guaiFENesin (MUCINEX DM) 30-600 MG 12hr tablet Take 1 tablet by mouth 2 (two) times daily. 10/18/20   Lavina Hamman, MD  diltiazem (CARDIZEM CD) 120 MG 24 hr capsule Take 1 capsule (120 mg total) by mouth daily. 10/19/20   Lavina Hamman, MD  docusate sodium (COLACE) 100 MG capsule Take 100 mg by mouth 2 (two) times daily as needed for mild constipation.    [provider]  feeding supplement (ENSURE ENLIVE / ENSURE PLUS) LIQD Take 237 mLs by mouth 3 (three) times daily between meals. 10/18/20   Lavina Hamman, MD  letrozole [employer]) 2.5 MG tablet TAKE 1 TABLET BY MOUTH EVERY DAY 09/30/19   [name], MD  lidocaine-prilocaine (EMLA) cream Apply to affected area once 08/17/20   [name], MD  LUMIGAN 0.01 % SOLN Place  1 drop into both eyes at bedtime. 01/13/18   [provider]  mirtazapine (REMERON) 7.5 MG tablet Take 7.5 mg by mouth at bedtime. 10/16/20   [provider]  Multiple Vitamin (MULTIVITAMIN WITH MINERALS) TABS tablet Take 1 tablet by mouth daily. 10/19/20   Lavina Hamman, MD  ondansetron (ZOFRAN) 8 MG tablet Take 1 tablet (8 mg total) by mouth 2 (two) times daily as needed for refractory nausea / vomiting. 08/17/20   [name], MD  pantoprazole (PROTONIX) 40 MG tablet Take 40 mg by mouth daily.    [provider]  prochlorperazine (COMPAZINE) 10 MG tablet Take 1 tablet (10 mg total) by mouth every 6 (six) hours as needed (Nausea or vomiting). 08/17/20   [name], MD    Physical Exam: Vitals:   11/07/20 1759 11/07/20 1800 11/07/20 1801 11/07/20 1805  BP:   121/75   Pulse:    61  Resp: 20     Temp:    98.2 F (36.8 C)  SpO2: 99%     Weight:  62 kg    Height:  5' 2"  (1.575 m)       Vitals:   11/07/20 1759 11/07/20 1800 11/07/20  1801 11/07/20 1805  BP:   121/75   Pulse:    61  Resp: 20     Temp:    98.2 F (36.8 C)  SpO2: 99%     Weight:  62 kg    Height:  5' 2"  (1.575 m)        Constitutional: Alert and oriented x 3 . Not in any apparent distress HEENT:      Head: Normocephalic and atraumatic.         Eyes: PERLA, EOMI, Conjunctivae are normal. Sclera is non-icteric.       Mouth/Throat: Mucous membranes are moist.       Neck: Supple with no signs of meningismus. Cardiovascular: Regular rate and rhythm. No murmurs, gallops, or rubs. 2+ symmetrical distal pulses are present . No JVD. No LE edema Respiratory: Respiratory effort normal .coarse breath sounds Gastrointestinal: Soft, non tender, and non distended with positive bowel sounds.  Genitourinary: No CVA tenderness. Musculoskeletal: Nontender with normal range of motion in all extremities. No cyanosis, or erythema of extremities. Neurologic:  Face is symmetric. Moving all extremities. No gross focal neurologic deficits . Skin: Skin is warm, dry.  No rash or ulcers Psychiatric: Mood and affect are normal    Labs on Admission: I have personally reviewed following labs and imaging studies  CBC: Recent Labs  Lab 11/07/20 1925  WBC 2.3*  NEUTROABS 2.0  HGB 12.1  HCT 37.8  MCV 89.6  PLT 65*   Basic Metabolic Panel: Recent Labs  Lab 11/07/20 1925  NA 134*  K 4.2  CL 104  CO2 22  GLUCOSE 108*  BUN 21  CREATININE 0.56  CALCIUM 8.1*  MG 1.8   GFR: Estimated Creatinine Clearance: 44.6 mL/min (by C-G formula based on SCr of 0.56 mg/dL). Liver Function Tests: No results for input(s): AST, ALT, ALKPHOS, BILITOT, PROT, ALBUMIN in the last 168 hours. No results for input(s): LIPASE, AMYLASE in the last 168 hours. No results for input(s): AMMONIA in the last 168 hours. Coagulation Profile: No results for input(s): INR, PROTIME in the last 168 hours. Cardiac Enzymes: No results for input(s): CKTOTAL, CKMB, CKMBINDEX, TROPONINI in the last  168 hours. BNP (last 3 results) No results for input(s): PROBNP in the last 8760 hours.  HbA1C: No results for input(s): HGBA1C in the last 72 hours. CBG: No results for input(s): GLUCAP in the last 168 hours. Lipid Profile: No results for input(s): CHOL, HDL, LDLCALC, TRIG, CHOLHDL, LDLDIRECT in the last 72 hours. Thyroid Function Tests: No results for input(s): TSH, T4TOTAL, FREET4, T3FREE, THYROIDAB in the last 72 hours. Anemia Panel: No results for input(s): VITAMINB12, FOLATE, FERRITIN, TIBC, IRON, RETICCTPCT in the last 72 hours. Urine analysis:    Component Value Date/Time   COLORURINE YELLOW (A) 10/11/2020 1329   APPEARANCEUR HAZY (A) 10/11/2020 1329   LABSPEC 1.015 10/11/2020 1329   PHURINE 7.0 10/11/2020 1329   GLUCOSEU NEGATIVE 10/11/2020 1329   HGBUR SMALL (A) 10/11/2020 1329   BILIRUBINUR NEGATIVE 10/11/2020 1329   KETONESUR NEGATIVE 10/11/2020 1329   PROTEINUR 100 (A) 10/11/2020 1329   NITRITE NEGATIVE 10/11/2020 1329   LEUKOCYTESUR LARGE (A) 10/11/2020 1329    Radiological Exams on Admission: DG Chest 2 View  Result Date: 11/07/2020 CLINICAL DATA:  Shortness of breath EXAM: CHEST - 2 VIEW COMPARISON:  10/14/2020 FINDINGS: Left-sided implanted cardiac device and right chest port remain in place. Heart size within normal limits. Atherosclerotic calcification of the aortic knob. Low lung volumes. Soft tissue fullness in the right hilar region is similar to prior. Streaky right basilar opacity, slightly increased from prior. Left lung is clear. No pleural effusion or pneumothorax. IMPRESSION: Streaky right basilar opacity, slightly increased from prior. Findings may reflect atelectasis or infiltrate. Electronically Signed   By: Davina Poke D.O.   On: 11/07/2020 19:42     Assessment/Plan 87 year old  patient with a history of CAD, pacemaker secondary to complete heart block, HTN, A. fib not on anticoagulation, CVA, breast cancer status post right mastectomy, squamous  cell carcinoma of the lung on chemo and radiation, hospitalized from 6/17-6/25 with UTI and elevated troponin believed secondary to demand from tachyarrhythmia, presenting with 1 week of cough and weakness    NSTEMI (non-ST elevated myocardial infarction) ([place])   CAD (coronary artery disease) -Patient presents with 1 week of weakness, mild shortness of breath without chest pain, elevated troponin of 317, above baseline in the 40s nonacute EKG - Heparin infusion - Received 324 mg aspirin in the ED. - Continue home Plavix and atorvastatin - Cardiology consult  Pneumonia - Patient presents with 1 week of productive cough and weakness in the setting of recent hospitalization, and immunosuppressed state due to chemotherapy - COVID and flu negative - Chest x-ray showing streaky right basilar opacity, slightly increased from prior which may reflect atelectasis or infiltrate - Rocephin and azithromycin - Follow procalcitonin, and DC antibiotics if positive   Leukopenia due to antineoplastic chemotherapy (HCC) - WBC 2.3.  Other cell lines normal    Pacemaker-secondary to complete heart block - No acute issues suspected - Recently seen by cardiology 7/1 outpatient    Atrial fibrillation /PAF/PAT - Not on anticoagulation - Recently started on Cardizem    History of CVA (cerebrovascular accident) - Continue Plavix and atorvastatin    Essential hypertension - Continue Cardizem    Malignant neoplasm of right breast s/p mastectomy squamous cell carcinoma of right lung (HCC) - No acute disease    DVT prophylaxis: Full dose heparin  code Status: full code  Family Communication:  none  Disposition Plan: Back to previous home environment Consults called: Cardiology Status: Observation    Athena Masse MD [facility]     11/07/2020, 8:53 PM

## 2020-11-08 ENCOUNTER — Encounter: Payer: Self-pay | Admitting: Internal Medicine

## 2020-11-08 DIAGNOSIS — R531 Weakness: Secondary | ICD-10-CM

## 2020-11-08 DIAGNOSIS — D701 Agranulocytosis secondary to cancer chemotherapy: Secondary | ICD-10-CM | POA: Diagnosis not present

## 2020-11-08 DIAGNOSIS — T451X5A Adverse effect of antineoplastic and immunosuppressive drugs, initial encounter: Secondary | ICD-10-CM

## 2020-11-08 DIAGNOSIS — Z950 Presence of cardiac pacemaker: Secondary | ICD-10-CM

## 2020-11-08 DIAGNOSIS — I248 Other forms of acute ischemic heart disease: Secondary | ICD-10-CM | POA: Diagnosis not present

## 2020-11-08 DIAGNOSIS — J189 Pneumonia, unspecified organism: Secondary | ICD-10-CM

## 2020-11-08 DIAGNOSIS — I25118 Atherosclerotic heart disease of native coronary artery with other forms of angina pectoris: Secondary | ICD-10-CM | POA: Diagnosis not present

## 2020-11-08 DIAGNOSIS — C3491 Malignant neoplasm of unspecified part of right bronchus or lung: Secondary | ICD-10-CM

## 2020-11-08 DIAGNOSIS — I214 Non-ST elevation (NSTEMI) myocardial infarction: Secondary | ICD-10-CM | POA: Diagnosis not present

## 2020-11-08 LAB — HIV ANTIBODY (ROUTINE TESTING W REFLEX): HIV Screen 4th Generation wRfx: NONREACTIVE

## 2020-11-08 LAB — BASIC METABOLIC PANEL
Anion gap: 7 (ref 5–15)
BUN: 18 mg/dL (ref 8–23)
CO2: 24 mmol/L (ref 22–32)
Calcium: 7.7 mg/dL — ABNORMAL LOW (ref 8.9–10.3)
Chloride: 105 mmol/L (ref 98–111)
Creatinine, Ser: 0.35 mg/dL — ABNORMAL LOW (ref 0.44–1.00)
GFR, Estimated: >60 mL/min (ref >60)
Glucose, Bld: 84 mg/dL (ref 70–99)
Potassium: 3.5 mmol/L (ref 3.5–5.1)
Sodium: 136 mmol/L (ref 135–145)

## 2020-11-08 LAB — HEPARIN LEVEL (UNFRACTIONATED)
Heparin Unfractionated: 0.27 IU/mL — ABNORMAL LOW (ref 0.30–0.70)
Heparin Unfractionated: 0.47 IU/mL (ref 0.30–0.70)

## 2020-11-08 LAB — D-DIMER, QUANTITATIVE: D-Dimer, Quant: 1.1 ug/mL-FEU — ABNORMAL HIGH (ref 0.00–0.50)

## 2020-11-08 LAB — CBC
HCT: 33.5 % — ABNORMAL LOW (ref 36.0–46.0)
Hemoglobin: 10.8 g/dL — ABNORMAL LOW (ref 12.0–15.0)
MCH: 28.7 pg (ref 26.0–34.0)
MCHC: 32.2 g/dL (ref 30.0–36.0)
MCV: 89.1 fL (ref 80.0–100.0)
Platelets: 57 10*3/uL — ABNORMAL LOW (ref 150–400)
RBC: 3.76 MIL/uL — ABNORMAL LOW (ref 3.87–5.11)
RDW: 23.4 % — ABNORMAL HIGH (ref 11.5–15.5)
WBC: 2.1 10*3/uL — ABNORMAL LOW (ref 4.0–10.5)
nRBC: 0.9 % — ABNORMAL HIGH (ref 0.0–0.2)

## 2020-11-08 MED ORDER — IPRATROPIUM-ALBUTEROL 0.5-2.5 (3) MG/3ML IN SOLN
3.0000 mL | Freq: Three times a day (TID) | RESPIRATORY_TRACT | Status: DC
  Administered 2020-11-08 – 2020-11-09 (×4): 3 mL via RESPIRATORY_TRACT
  Filled 2020-11-08 (×4): qty 3

## 2020-11-08 MED ORDER — LATANOPROST 0.005 % OP SOLN
1.0000 [drp] | Freq: Every day | OPHTHALMIC | Status: DC
  Administered 2020-11-08: 1 [drp] via OPHTHALMIC
  Filled 2020-11-08: qty 2.5

## 2020-11-08 MED ORDER — ENSURE ENLIVE PO LIQD
237.0000 mL | Freq: Two times a day (BID) | ORAL | Status: DC
  Administered 2020-11-08 – 2020-11-09 (×4): 237 mL via ORAL

## 2020-11-08 MED ORDER — GUAIFENESIN-DM 100-10 MG/5ML PO SYRP
5.0000 mL | ORAL_SOLUTION | ORAL | Status: DC | PRN
  Administered 2020-11-09: 5 mL via ORAL
  Filled 2020-11-08: qty 5

## 2020-11-08 MED ORDER — LETROZOLE 2.5 MG PO TABS
2.5000 mg | ORAL_TABLET | Freq: Every day | ORAL | Status: DC
  Administered 2020-11-09: 2.5 mg via ORAL
  Filled 2020-11-08: qty 1

## 2020-11-08 MED ORDER — DEXAMETHASONE 4 MG PO TABS
4.0000 mg | ORAL_TABLET | Freq: Every day | ORAL | Status: DC
  Administered 2020-11-08 – 2020-11-09 (×2): 4 mg via ORAL
  Filled 2020-11-08 (×2): qty 1

## 2020-11-08 MED ORDER — ADULT MULTIVITAMIN W/MINERALS CH
1.0000 | ORAL_TABLET | Freq: Every day | ORAL | Status: DC
  Administered 2020-11-09: 1 via ORAL
  Filled 2020-11-08: qty 1

## 2020-11-08 MED ORDER — DILTIAZEM HCL ER COATED BEADS 120 MG PO CP24
120.0000 mg | ORAL_CAPSULE | Freq: Every day | ORAL | Status: DC
  Administered 2020-11-08 – 2020-11-09 (×2): 120 mg via ORAL
  Filled 2020-11-08 (×2): qty 1

## 2020-11-08 MED ORDER — HEPARIN BOLUS VIA INFUSION
900.0000 [IU] | Freq: Once | INTRAVENOUS | Status: AC
  Administered 2020-11-08: 900 [IU] via INTRAVENOUS
  Filled 2020-11-08: qty 900

## 2020-11-08 NOTE — Progress Notes (Signed)
PROGRESS NOTE    [name]  KGY:[id number] DOB: 1934-01-05 DOA: 11/07/2020 PCP: Philmore Pali, NP  [phone]   Assessment & Plan:   Principal Problem:   Postobstructive pneumonia Active Problems:   Pacemaker-Medtronic   Atrial fibrillation ([place])   History of CVA (cerebrovascular accident)   Essential hypertension   Malignant neoplasm of upper-outer quadrant of right breast in female, estrogen receptor positive ([place])   Squamous cell carcinoma of right lung (HCC)   CAD (coronary artery disease)   Leukopenia due to antineoplastic chemotherapy [facility])   Demand ischemia ([place])   [name] is a 87 y.o. female with medical history significant for CAD, pacemaker secondary to complete heart block, HTN, A. fib not on anticoagulation, CVA, breast cancer status post right mastectomy, non-small cell carcinoma of the lung on chemo and radiation, hospitalized from 6/17-6/25 with UTI and elevated troponin believed secondary to demand from tachyarrhythmia, who presents to the ED with a 1 week history of cough and weakness.       NSTEMI, ruled out Trop elevation likely due to demand ischemia   CAD (coronary artery disease) --without chest pain, elevated troponin of 317 already trended down --started on heparin gtt --cardiology consulted on admission Plan: --cont heparin gtt for 48 hours total - Continue home Plavix and atorvastatin  Pneumonia, ruled out Obstructing lung mass, POA - Patient presents with 1 week of productive cough and weakness in the setting of recent hospitalization, and immunosuppressed state due to chemotherapy.  Ceftriaxone and azithromycin started on admission. - COVID and flu negative - Chest x-ray showing streaky right basilar opacity, slightly increased from prior which may reflect atelectasis or infiltrate --no hypoxia, fever, leukocytosis, and procal only 0.21. --cough with sputum and wheezing likely due to obstructive lung masses  Plan: --d/c  ceftriaxone and azithromycin --chest PT and DuoNeb for mucus clearing    Leukopenia due to antineoplastic chemotherapy (HCC) - WBC 2.3.  Other cell lines normal     Pacemaker-secondary to complete heart block - No acute issues suspected - Recently seen by cardiology 7/1 outpatient     Atrial fibrillation /PAF/PAT - Not on anticoagulation --cont home cardizem     History of CVA (cerebrovascular accident) - Continue Plavix and atorvastatin     Essential hypertension - Continue Cardizem     Malignant neoplasm of right breast s/p mastectomy  squamous cell carcinoma of right lung ([place]) -follows with Dr. Grayland Ormond  --discussed with oncall oncology, no need for inpatient consult --palliative care discussion deferred to oncology  Debility and weakness --per family, pt has had rapid decline, and is now mostly bed-bound.  Currently, pt doesn't want to go back to SNF. --PT/OT  Severe malnutrition in context of chronic illness --nutrition consulted, supplements ordered   DVT prophylaxis: OY:DXAJOIN gtt Code Status: Full code  Family Communication: granddaughter updated at bedside Level of care: Progressive Cardiac Dispo:   The patient is from: home Anticipated d/c is to: home Anticipated d/c date is: likely tomorrow Patient currently is not medically ready to d/c due to: heparin gtt for 48 hours, per cardio.   Subjective and Interval History:  Pt reported cough with sputum production that's been going on for a while.  No significant dyspnea.  No chest pain.   Objective: Vitals:   11/08/20 0722 11/08/20 1100 11/08/20 1337 11/08/20 1600  BP: 113/65 118/69  123/73  Pulse: 61 64  70  Resp: 16 16  16   Temp: 97.9 F (36.6 C) 98 F (36.7  C)  98.1 F (36.7 C)  TempSrc:  Axillary    SpO2: 99%  96% 98%  Weight:  49 kg    Height:  5\' 2"  (1.575 m)      Intake/Output Summary (Last 24 hours) at 11/08/2020 1743 Last data filed at 11/08/2020 1600 Gross per 24 hour  Intake 766.98  ml  Output 450 ml  Net 316.98 ml   Filed Weights   11/07/20 2322 11/08/20 0100 11/08/20 1100  Weight: 48.6 kg 48.6 kg 49 kg    Examination:   Constitutional: NAD, AAOx3 HEENT: conjunctivae and lids normal, EOMI CV: No cyanosis.   RESP: audible bronchial wheezing, on RA Extremities: No effusions, edema in BLE SKIN: warm, dry Neuro: II - XII grossly intact.   Psych: Normal mood and affect.     Data Reviewed: I have personally reviewed following labs and imaging studies  CBC: Recent Labs  Lab 11/07/20 1925 11/08/20 0533  WBC 2.3* 2.1*  NEUTROABS 2.0  --   HGB 12.1 10.8*  HCT 37.8 33.5*  MCV 89.6 89.1  PLT 65* 57*   Basic Metabolic Panel: Recent Labs  Lab 11/07/20 1925 11/08/20 0533  NA 134* 136  K 4.2 3.5  CL 104 105  CO2 22 24  GLUCOSE 108* 84  BUN 21 18  CREATININE 0.56 0.35*  CALCIUM 8.1* 7.7*  MG 1.8  --    GFR: Estimated Creatinine Clearance: 39.8 mL/min (A) (by C-G formula based on SCr of 0.35 mg/dL (L)). Liver Function Tests: No results for input(s): AST, ALT, ALKPHOS, BILITOT, PROT, ALBUMIN in the last 168 hours. No results for input(s): LIPASE, AMYLASE in the last 168 hours. No results for input(s): AMMONIA in the last 168 hours. Coagulation Profile: Recent Labs  Lab 11/07/20 2109  INR 1.1   Cardiac Enzymes: No results for input(s): CKTOTAL, CKMB, CKMBINDEX, TROPONINI in the last 168 hours. BNP (last 3 results) No results for input(s): PROBNP in the last 8760 hours. HbA1C: No results for input(s): HGBA1C in the last 72 hours. CBG: No results for input(s): GLUCAP in the last 168 hours. Lipid Profile: No results for input(s): CHOL, HDL, LDLCALC, TRIG, CHOLHDL, LDLDIRECT in the last 72 hours. Thyroid Function Tests: No results for input(s): TSH, T4TOTAL, FREET4, T3FREE, THYROIDAB in the last 72 hours. Anemia Panel: No results for input(s): VITAMINB12, FOLATE, FERRITIN, TIBC, IRON, RETICCTPCT in the last 72 hours. Sepsis Labs: Recent Labs   Lab 11/07/20 2048  PROCALCITON 0.21    Recent Results (from the past 240 hour(s))  Resp Panel by RT-PCR (Flu A&B, Covid) Nasopharyngeal Swab     Status: None   Collection Time: 11/07/20  7:25 PM   Specimen: Nasopharyngeal Swab; Nasopharyngeal(NP) swabs in vial transport medium  Result Value Ref Range Status   SARS Coronavirus 2 by RT PCR NEGATIVE NEGATIVE Final    Comment: (NOTE) SARS-CoV-2 target nucleic acids are NOT DETECTED.  The SARS-CoV-2 RNA is generally detectable in upper respiratory specimens during the acute phase of infection. The lowest concentration of SARS-CoV-2 viral copies this assay can detect is 138 copies/mL. A negative result does not preclude SARS-Cov-2 infection and should not be used as the sole basis for treatment or other patient management decisions. A negative result may occur with  improper specimen collection/handling, submission of specimen other than nasopharyngeal swab, presence of viral mutation(s) within the areas targeted by this assay, and inadequate number of viral copies(<138 copies/mL). A negative result must be combined with clinical observations, patient history, and epidemiological  information. The expected result is Negative.  Fact Sheet for Patients:  EntrepreneurPulse.com.au  Fact Sheet for Healthcare Providers:  IncredibleEmployment.be  This test is no t yet approved or cleared by the [place] FDA and  has been authorized for detection and/or diagnosis of SARS-CoV-2 by FDA under an Emergency Use Authorization (EUA). This EUA will remain  in effect (meaning this test can be used) for the duration of the COVID-19 declaration under Section 564(b)(1) of the Act, 21 U.S.C.section 360bbb-3(b)(1), unless the authorization is terminated  or revoked sooner.       Influenza A by PCR NEGATIVE NEGATIVE Final   Influenza B by PCR NEGATIVE NEGATIVE Final    Comment: (NOTE) The Xpert Xpress  SARS-CoV-2/FLU/RSV plus assay is intended as an aid in the diagnosis of influenza from Nasopharyngeal swab specimens and should not be used as a sole basis for treatment. Nasal washings and aspirates are unacceptable for Xpert Xpress SARS-CoV-2/FLU/RSV testing.  Fact Sheet for Patients: EntrepreneurPulse.com.au  Fact Sheet for Healthcare Providers: IncredibleEmployment.be  This test is not yet approved or cleared by the [place] FDA and has been authorized for detection and/or diagnosis of SARS-CoV-2 by FDA under an Emergency Use Authorization (EUA). This EUA will remain in effect (meaning this test can be used) for the duration of the COVID-19 declaration under Section 564(b)(1) of the Act, 21 U.S.C. section 360bbb-3(b)(1), unless the authorization is terminated or revoked.  Performed at [facility], [address] [place], [place] [address]   Culture, blood (routine x 2) Call MD if unable to obtain prior to antibiotics being given     Status: None (Preliminary result)   Collection Time: 11/07/20  9:54 PM   Specimen: BLOOD  Result Value Ref Range Status   Specimen Description BLOOD BLOOD RIGHT FOREARM  Final   Special Requests   Final    BOTTLES DRAWN AEROBIC AND ANAEROBIC Blood Culture adequate volume   Culture   Final    NO GROWTH < 12 HOURS Performed at [facility], [address] [place], [place] [address]    Report Status PENDING  Incomplete  Culture, blood (routine x 2) Call MD if unable to obtain prior to antibiotics being given     Status: None (Preliminary result)   Collection Time: 11/07/20  9:54 PM   Specimen: BLOOD  Result Value Ref Range Status   Specimen Description BLOOD BLOOD RIGHT WRIST  Final   Special Requests   Final    BOTTLES DRAWN AEROBIC AND ANAEROBIC Blood Culture results may not be optimal due to an excessive volume of blood received in culture bottles   Culture   Final    NO GROWTH <  12 HOURS Performed at [facility], [address] [place], [place] [address]    Report Status PENDING  Incomplete      Radiology Studies: DG Chest 2 View  Result Date: 11/07/2020 CLINICAL DATA:  Shortness of breath EXAM: CHEST - 2 VIEW COMPARISON:  10/14/2020 FINDINGS: Left-sided implanted cardiac device and right chest port remain in place. Heart size within normal limits. Atherosclerotic calcification of the aortic knob. Low lung volumes. Soft tissue fullness in the right hilar region is similar to prior. Streaky right basilar opacity, slightly increased from prior. Left lung is clear. No pleural effusion or pneumothorax. IMPRESSION: Streaky right basilar opacity, slightly increased from prior. Findings may reflect atelectasis or infiltrate. Electronically Signed   By: Davina Poke D.O.   On: 11/07/2020 19:42  Scheduled Meds:  atorvastatin  20 mg Oral Daily   clopidogrel  75 mg Oral Daily   feeding supplement  237 mL Oral BID BM   ipratropium-albuterol  3 mL Nebulization TID   [START ON 11/09/2020] multivitamin with minerals  1 tablet Oral Daily   Continuous Infusions:  heparin 900 Units/hr (11/08/20 0805)     LOS: 0 days     Enzo Bi, MD [facility] If 7PM-7AM, please contact night-coverage 11/08/2020, 5:43 PM

## 2020-11-08 NOTE — Consult Note (Signed)
Cardiology Consultation:   Patient ID: [name] MRN: [id number]; DOB: August 20, 1933  Admit date: 11/07/2020 Date of Consult: 11/08/2020  PCP:  Philmore Pali, NP   [facility] Providers Cardiologist:  Virl Axe, MD        Patient Profile:   [name] is a 87 y.o. female with a hx of complete heart block s/p PPM, PAF, HTN, HLD, PAH, CKD stage 3, GERD, gout, breast cancer, right lung cancer (on chemotherapy/radiation), history of CVA who is being seen 11/08/2020 for the evaluation of elevated troponin at the request of Dr. Damita Dunnings.  History of Present Illness:   Ms. Caffey underwent PPM placement 08/2004 in the setting of CHB. Most recent generator change out was 07/2013. Device interrogation has shown occasional high atrial rate, suggestive of PAF. 07/2020 interrogation showed stable AF burden 13.3% with longest episode 24 months. She has not been anticoagulated. Echo 06/2018 showed EF 60-65%, normal WMA, normal RV function with RVSP 67mHg, mild aortic valve calcification. She has been followed closely by oncology for right lung cancer. She has chronic weakness and fatigue.   Patient was recently admitted with fall and weakness found to have UTI and elevated troponin. She was hemodynamically stable. PPM interrogation normal. Mildly elevated troponin. No chest pain. BUN/Cr above baseline. Elevated trop suspected demand ischemia. Echo showed low normal LVEF with recommendation to defer ischemia testing, given comorbidities.PPM interrogation was reviewed ith multiple episodes of mode switching. It was thought that atrial rates were most consistent with SVT.PAT, though at least one episode of atrial rate of approx 400 could represent afib. Ventricular rates were well controlled in the setting of complete heart block. Amlodipine was changed to diltiazem. She remained a suboptimal candidate for anticoagulation with recommendation to continue to defer a/c, [articularly given paucity og  evidence for afib.  Seen in the office for hospital follow-up 10/28/20 and was doing well from a cardiac perspective.   [facility] and weakness for the last week. No chest pain, fever, chills. No orthopnea, LLE, pnd, palpitations. Denied abdominal pain, diarrhea or ydsuria.   In the ED BP 121/75, afebrile, pulse 61, 99% O2. Labs showed BNP 151, HS trop 317>297, WBC 2.3, creatinine 0.56, BUN 21, potassium 4.2, sodium 134, Hfb 12.1. CXR slightly increased from prior with possible atelectasis or infiltrate. Patient on IV heparin and admitted. EKG showed atrial rhythm, 61 bpm.  Past Medical History:  Diagnosis Date   Breast cancer, right ([place] 11/[place]   Mastectomy   C. difficile colitis    CKD (chronic kidney disease), stage III ([place]    Complete heart block ([place]    a. Originally placed 08/2004; b. 07/2013 s/p MDT [place] (ser # [id number].   Dyspnea    Gait abnormality 07/23/2016   GERD (gastroesophageal reflux disease)    Gout    Hemoptysis 07/25/2020   History of hypertension    History of stroke    Hyperhomocysteinemia (HCC)    Hyperlipidemia    Hypertension    Hypokalemia 07/13/2018   Low blood magnesium 07/13/2018   Lung cancer ([place]    Pacemaker-Medtronic 08/07/2011   PAF (paroxysmal atrial fibrillation) ([place]    a. 07/2020 Device interrogation-->13.3% AF burden-->longest atrial high rate episode 24:26 mins-->AT vs AF. Not on [place]   Pulmonary hypertension ([place]    a. 06/2018 Echo: EF 60-65%, no rwma, nl RV fxn, RVSP 48.49mg. Mild AoV Ca2+.   Rhabdomyolysis    Stroke (  [place])    left sided weakness, 07/28/20- no residual    Vomiting 07/13/2018    Past Surgical History:  Procedure Laterality Date   BIOPSY  07/15/2018   Procedure: BIOPSY;  Surgeon: [name], MD;  Location: [facility];  Service: Gastroenterology;;   BREAST BIOPSY Right 10/27/2018   affirm bx of mass, x clip,  INVASIVE MAMMARY CARCINOMA   BREAST  LUMPECTOMY Right 12/03/2018   BRONCHIAL BIOPSY  07/29/2020   Procedure: BRONCHIAL BIOPSIES;  Surgeon: Laurin Coder, MD;  Location: [place];  Service: Pulmonary;;   BRONCHIAL NEEDLE ASPIRATION BIOPSY  07/29/2020   Procedure: BRONCHIAL NEEDLE ASPIRATION BIOPSIES;  Surgeon: Laurin Coder, MD;  Location: [place];  Service: Pulmonary;;   CARDIAC CATHETERIZATION     ENDOBRONCHIAL ULTRASOUND N/A 07/29/2020   Procedure: ENDOBRONCHIAL ULTRASOUND;  Surgeon: Laurin Coder, MD;  Location: [place];  Service: Pulmonary;  Laterality: N/A;   ESOPHAGOGASTRODUODENOSCOPY Left 07/15/2018   Procedure: ESOPHAGOGASTRODUODENOSCOPY (EGD);  Surgeon: [name], MD;  Location: [facility];  Service: Gastroenterology;  Laterality: Left;   HEMOSTASIS CONTROL  07/29/2020   Procedure: HEMOSTASIS CONTROL;  Surgeon: Laurin Coder, MD;  Location: [place] [facility];  Service: Pulmonary;;   PACEMAKER INSERTION     Medtronic Enpulse dual-chamber pacemaker   PARTIAL MASTECTOMY WITH NEEDLE LOCALIZATION AND AXILLARY SENTINEL LYMPH NODE BX Right 12/03/2018   Procedure: PARTIAL MASTECTOMY WITH NEEDLE LOCALIZATION AND AXILLARY SENTINEL LYMPH NODE BX, RIGHT;  Surgeon: [name], MD;  Location: [facility];  Service: General;  Laterality: Right;   PERMANENT PACEMAKER GENERATOR CHANGE N/A 07/30/2013   Procedure: PERMANENT PACEMAKER GENERATOR CHANGE;  Surgeon: [name], MD;  Location: [facility] CATH LAB;  Service: Cardiovascular;  Laterality: N/A;   PORTA CATH INSERTION N/A 08/16/2020   Procedure: PORTA CATH INSERTION;  Surgeon: Katha Cabal, MD;  Location: [place] CV LAB;  Service: Cardiovascular;  Laterality: N/A;   VIDEO BRONCHOSCOPY N/A 07/29/2020   Procedure: VIDEO BRONCHOSCOPY WITH FLUORO;  Surgeon: Laurin Coder, MD;  Location: [place] ENDOSCOPY;  Service: Pulmonary;  Laterality: N/A;     Home Medications:  Prior to Admission medications   Medication Sig Start Date End Date Taking?  Authorizing Provider  acetaminophen (TYLENOL) 500 MG tablet Take 500-1,000 mg by mouth every 6 (six) hours as needed for moderate pain.    Yes [provider]  alendronate (FOSAMAX) 70 MG tablet TAKE 1 TABLET (70 MG TOTAL) BY MOUTH ONCE A WEEK. TAKE WITH A FULL GLASS OF WATER ON AN EMPTY STOMACH. 03/14/20  Yes [name], MD  allopurinol (ZYLOPRIM) 100 MG tablet Take 100 mg by mouth daily. 11/28/19  Yes [provider]  atorvastatin (LIPITOR) 20 MG tablet Take 1 tablet (20 mg total) by mouth daily. 10/18/20 10/18/21 Yes Lavina Hamman, MD  calcium-vitamin D 250-100 MG-UNIT tablet Take 2 tablets by mouth daily.   Yes [provider]  clopidogrel (PLAVIX) 75 MG tablet Take 75 mg by mouth daily.   Yes [provider]  dexamethasone (DECADRON) 4 MG tablet Take 1 tablet (4 mg total) by mouth daily. 09/27/20  Yes [name], MD  dextromethorphan-guaiFENesin [facility] DM) 30-600 MG 12hr tablet Take 1 tablet by mouth 2 (two) times daily. 10/18/20  Yes Lavina Hamman, MD  diltiazem (CARDIZEM CD) 120 MG 24 hr capsule Take 1 capsule (120 mg total) by mouth daily. 10/19/20  Yes Lavina Hamman, MD  docusate sodium (COLACE) 100 MG capsule Take 100 mg by mouth  2 (two) times daily as needed for mild constipation.   Yes [provider]  letrozole (FEMARA) 2.5 MG tablet TAKE 1 TABLET BY MOUTH EVERY DAY 09/30/19  Yes [name], MD  lidocaine-prilocaine (EMLA) cream Apply to affected area once 08/17/20  Yes Finnegan, [name], MD  LUMIGAN 0.01 % SOLN Place 1 drop into both eyes at bedtime. 01/13/18  Yes [provider]  magnesium hydroxide (MILK OF MAGNESIA) 400 MG/5ML suspension Take 30 mLs by mouth once. for constipation.   Yes [provider]  Magnesium Oxide 250 MG TABS Take 250 mg by mouth daily.   Yes [provider]  Multiple Vitamin (MULTIVITAMIN WITH MINERALS) TABS tablet Take 1 tablet by mouth daily. 10/19/20  Yes  Lavina Hamman, MD  ondansetron (ZOFRAN) 8 MG tablet Take 1 tablet (8 mg total) by mouth 2 (two) times daily as needed for refractory nausea / vomiting. 08/17/20  Yes [name], MD  prochlorperazine (COMPAZINE) 10 MG tablet Take 1 tablet (10 mg total) by mouth every 6 (six) hours as needed (Nausea or vomiting). 08/17/20  Yes [name], MD  Blood Pressure KIT Automated blood pressure measuring device. 06/29/18   [name], MD  feeding supplement (ENSURE ENLIVE / ENSURE PLUS) LIQD Take 237 mLs by mouth 3 (three) times daily between meals. 10/18/20   Lavina Hamman, MD  mirtazapine (REMERON) 7.5 MG tablet Take 7.5 mg by mouth at bedtime. Patient not taking: Reported on 11/07/2020 10/16/20   [provider]  pantoprazole (PROTONIX) 40 MG tablet Take 40 mg by mouth daily. Patient not taking: Reported on 11/07/2020    [provider]    Inpatient Medications: Scheduled Meds:  atorvastatin  20 mg Oral Daily   clopidogrel  75 mg Oral Daily   Continuous Infusions:  azithromycin 500 mg (11/08/20 0103)   cefTRIAXone (ROCEPHIN)  IV Stopped (11/07/20 2238)   heparin 900 Units/hr (11/08/20 0805)   PRN Meds: acetaminophen, nitroGLYCERIN, ondansetron (ZOFRAN) IV  Allergies:   No Known Allergies  Social History:   Social History   Socioeconomic History   Marital status: Single    Spouse name: Not on file   Number of children: 3   Years of education: 12   Highest education level: Not on file  Occupational History   Occupation: Retired  Tobacco Use   Smoking status: Former    Pack years: 0.00    Types: Cigarettes    Quit date: 07/25/2006    Years since quitting: 14.3   Smokeless tobacco: Never  Vaping Use   Vaping Use: Never used  Substance and Sexual Activity   Alcohol use: No   Drug use: No   Sexual activity: Not on file  Other Topics Concern   Not on file  Social History Narrative   Lives locally by herself.  Family nearby, but she arranges all  of her own medicines and takes care of her home.   Caffeine use: Coffee daily   Right handed   Social Determinants of Health   Financial Resource Strain: Not on file  Food Insecurity: Not on file  Transportation Needs: Not on file  Physical Activity: Not on file  Stress: Not on file  Social Connections: Not on file  Intimate Partner Violence: Not on file    Family History:    Family History  Problem Relation Age of Onset   Hypertension Mother    Breast cancer Sister    Breast cancer Sister  Colon cancer Neg Hx    Esophageal cancer Neg Hx      ROS:  Please see the history of present illness.   All other ROS reviewed and negative.     Physical Exam/Data:   Vitals:   11/07/20 2322 11/08/20 0100 11/08/20 0545 11/08/20 0722  BP: 104/60  (!) 142/70 113/65  Pulse: 61  67 61  Resp: 18  16 16   Temp: 98.7 F (37.1 C)  97.9 F (36.6 C) 97.9 F (36.6 C)  TempSrc: Oral  Oral   SpO2: 97%  97% 99%  Weight: 48.6 kg 48.6 kg    Height: 5' 2"  (1.575 m) 5' 2"  (1.575 m)      Intake/Output Summary (Last 24 hours) at 11/08/2020 0846 Last data filed at 11/08/2020 0600 Gross per 24 hour  Intake 406.98 ml  Output --  Net 406.98 ml   Last 3 Weights 11/08/2020 11/07/2020 11/07/2020  Weight (lbs) 107 lb 2.3 oz 107 lb 2.3 oz 136 lb 11 oz  Weight (kg) 48.6 kg 48.6 kg 62 kg     Body mass index is 19.6 kg/m.  General:  Well nourished, well developed, in no acute distress HEENT: normal Lymph: no adenopathy Neck: no JVD Endocrine:  No thryomegaly Vascular: No carotid bruits; FA pulses 2+ bilaterally without bruits  Cardiac:  normal S1, S2; RRR; no murmur  Lungs:  course breath sounds left side Abd: soft, nontender, no hepatomegaly  Ext: no edema Musculoskeletal:  No deformities, BUE and BLE strength normal and equal Skin: warm and dry  Neuro:  CNs 2-12 intact, no focal abnormalities noted Psych:  Normal affect   EKG:  The EKG was personally reviewed and demonstrates:  atrila  rhthym, 61bpm, will review with MD Telemetry:  Telemetry was personally reviewed and demonstrates:  intermittent AV pacing, A-sensed/V paced, HR 60s  Relevant CV Studies:  Echo 09/2020  1. Left ventricular ejection fraction, by estimation, is 50 %. The left  ventricle has low normal function. The left ventricle demonstrates  regional wall motion abnormalities (septal wall motion abnormality,  possibly secondary to paced rhythym). Left  ventricular diastolic parameters are indeterminate.   2. Right ventricular systolic function was not well visualized. The right  ventricular size is normal.    Laboratory Data:  High Sensitivity Troponin:   Recent Labs  Lab 10/15/20 0032 10/15/20 1044 10/15/20 1430 11/07/20 1925 11/07/20 2048  TROPONINIHS 87* 56* 43* 317* 297*     Chemistry Recent Labs  Lab 11/07/20 1925 11/08/20 0533  NA 134* 136  K 4.2 3.5  CL 104 105  CO2 22 24  GLUCOSE 108* 84  BUN 21 18  CREATININE 0.56 0.35*  CALCIUM 8.1* 7.7*  GFRNONAA >60 >60  ANIONGAP 8 7    No results for input(s): PROT, ALBUMIN, AST, ALT, ALKPHOS, BILITOT in the last 168 hours. Hematology Recent Labs  Lab 11/07/20 1925 11/08/20 0533  WBC 2.3* 2.1*  RBC 4.22 3.76*  HGB 12.1 10.8*  HCT 37.8 33.5*  MCV 89.6 89.1  MCH 28.7 28.7  MCHC 32.0 32.2  RDW 23.4* 23.4*  PLT 65* 57*   BNP Recent Labs  Lab 11/07/20 1925  BNP 151.4*    DDimer No results for input(s): DDIMER in the last 168 hours.   Radiology/Studies:  DG Chest 2 View  Result Date: 11/07/2020 CLINICAL DATA:  Shortness of breath EXAM: CHEST - 2 VIEW COMPARISON:  10/14/2020 FINDINGS: Left-sided implanted cardiac device and right chest port remain in place. Heart size  within normal limits. Atherosclerotic calcification of the aortic knob. Low lung volumes. Soft tissue fullness in the right hilar region is similar to prior. Streaky right basilar opacity, slightly increased from prior. Left lung is clear. No pleural effusion  or pneumothorax. IMPRESSION: Streaky right basilar opacity, slightly increased from prior. Findings may reflect atelectasis or infiltrate. Electronically Signed   By: Davina Poke D.O.   On: 11/07/2020 19:42     Assessment and Plan:   Elevated troponin/demand ischemia - elevated to 317 and down trending, not consistent with ACS. - Patient denies anginal symptoms, has cough and sob which might be from PNA vs lung cancer  - EKG with no ischemic changes - recent echo showed LVEF 50%, no WMA - IV heparin started>>would discontinue - no further ischemic evaluation - continue Aspirin and plavix (h/o stroke). Monitor labs with thrombocytopenia  Lung cancer ?PNA - currently undergoing radiation and chemo for left sided lung cancer - CXR with possible infiltrate vs atelectasis, could be post-obstructive PNA given lung cancer - Respiratory panel negative - IV abx per IM - consult oncology  PAF/PAT - device interrogation has shown atrial tachycardia/SVT but at times to be consistent with atrial fibrillation - No recurrence noted during current admission - continue diltiazem - not on a/c per Dr. Caryl Comes  CCB s/p PPM - normal functioning device by last interrogation  HTN - continue diltiazem - Bps normotensive    For questions or updates, please contact [facility] Please consult [email or web address] for contact info under    Signed, Porfiria Heinrich Ninfa Meeker, PA-C  11/08/2020 8:46 AM

## 2020-11-08 NOTE — Progress Notes (Signed)
Initial Nutrition Assessment  DOCUMENTATION CODES:   Severe malnutrition in context of chronic illness  INTERVENTION:   Ensure Enlive po TID, each supplement provides 350 kcal and 20 grams of protein  Magic cup TID with meals, each supplement provides 290 kcal and 9 grams of protein  MVI po daily   Liberalize diet   Pt at high refeed risk; recommend monitor potassium, magnesium and phosphorus labs daily until stable  NUTRITION DIAGNOSIS:   Severe Malnutrition related to cancer and cancer related treatments as evidenced by mild fat depletion, moderate fat depletion, moderate muscle depletion, severe muscle depletion, 16 percent weight loss in 2 months and 26% weight loss in 9 months.  GOAL:   Patient will meet greater than or equal to 90% of their needs  MONITOR:   PO intake, Supplement acceptance, Labs, Weight trends, Skin, I & O's  REASON FOR ASSESSMENT:   Malnutrition Screening Tool    ASSESSMENT:   87 y.o. female with medical history significant of s/p for pacemaker placement due to complete heart block, HTN, HLD, stroke, GERD, gout, A fib not on AC, CAD, c diff colitis, rhabdomyolysis, CKD-IIIa, R breast cancer (s/p for mastectomy 2020), squamous cell lung cancer on chemo and radiation therapy and recent admission for UTI and sepsis who is admitted for PNA and NSTEMI  Met with pt in room today. Pt is familiar to this RD from a recent previous admit. Pt reports poor appetite and oral intake for several months pta. Pt has been staying at [facility]. Pt reports that she does drink Ensure but that she has not been drinking this pta. Pt reports that her appetite has improved pta but family member at bedside shakes her head "no". Pt's lunch tray is sitting on her side table with only bites taken from it. Family member reports that pt ate 100% of her peaches and mashed potatoes for lunch and ate 2 bites of pot roast. Pt reports that she is willing to drink chocolate Ensure in  hospital. RD will add supplements and MVI to help pt meet her estimated needs. RD will also liberalize pt's diet. Discussed with pt the importance of adequate nutrition needed to preserve lean muscle. During pt's last admission she was down 45lbs(26%) over 9 months and down 24lbs(16%) over two months; this is significant weight loss. Pt's UBW is ~175-180lbs but pt reports that at one point she weighed over 200lbs. Pt currently documented to be ~27lbs under her UBW; RD unsure if bed weights are correct. Pt is followed by the dietitian at the cancer center. Pt has been on megace recently but feels as though this did not help her appetite.   Medications reviewed and include: plavix, MVI  Labs reviewed: creat 0.35(L) Wbc- 2.1(L), Hgb 10.8(L), Hct 33.5(L)  NUTRITION - FOCUSED PHYSICAL EXAM:  Flowsheet Row Most Recent Value  Orbital Region No depletion  Upper Arm Region Moderate depletion  Thoracic and Lumbar Region Mild depletion  Buccal Region No depletion  Temple Region Mild depletion  Clavicle Bone Region Mild depletion  Clavicle and Acromion Bone Region Mild depletion  Scapular Bone Region Moderate depletion  Dorsal Hand Severe depletion  Patellar Region Severe depletion  Anterior Thigh Region Severe depletion  Posterior Calf Region Severe depletion  Edema (RD Assessment) None  Hair Reviewed  Eyes Reviewed  Mouth Reviewed  Skin Reviewed  Nails Reviewed   Diet Order:   Diet Order             Diet Heart Room  service appropriate? Yes; Fluid consistency: Thin  Diet effective now                  EDUCATION NEEDS:   Education needs have been addressed  Skin:  Skin Assessment: Reviewed RN Assessment  Last BM:  7/10  Height:   Ht Readings from Last 1 Encounters:  11/08/20 5' 2"  (1.575 m)    Weight:   Wt Readings from Last 1 Encounters:  11/08/20 49 kg    Ideal Body Weight:  50 kg  BMI:  Body mass index is 19.77 kg/m.  Estimated Nutritional Needs:   Kcal:   1500-1700kcal/day  Protein:  75-85g/day  Fluid:  1.3-1.5L/day  Koleen Distance MS, RD, LDN Please refer to [facility] for RD and/or RD on-call/weekend/after hours pager

## 2020-11-08 NOTE — Plan of Care (Signed)

## 2020-11-08 NOTE — Consult Note (Signed)
ANTICOAGULATION CONSULT NOTE - Initial Consult  Pharmacy Consult for heparin infusion  Indication: chest pain/ACS  No Known Allergies  Patient Measurements: Height: 5\' 2"  (157.5 cm) Weight: 48.6 kg (107 lb 2.3 oz) IBW/kg (Calculated) : 50.1 Heparin Dosing Weight: 62 kg   Vital Signs: Temp: 97.9 F (36.6 C) (07/12 0722) Temp Source: Oral (07/12 0545) BP: 113/65 (07/12 0722) Pulse Rate: 61 (07/12 0722)  Labs: Recent Labs    11/07/20 1925 11/07/20 2048 11/07/20 2109 11/08/20 0533  HGB 12.1  --   --  10.8*  HCT 37.8  --   --  33.5*  PLT 65*  --   --  57*  APTT  --   --  27  --   LABPROT  --   --  14.3  --   INR  --   --  1.1  --   HEPARINUNFRC  --   --   --  0.27*  CREATININE 0.56  --   --  0.35*  TROPONINIHS 317* 297*  --   --      Estimated Creatinine Clearance: 39.4 mL/min (A) (by C-G formula based on SCr of 0.35 mg/dL (L)).   Medical History: Past Medical History:  Diagnosis Date   Breast cancer, right ([place]) 11/2018   Mastectomy   C. difficile colitis    CKD (chronic kidney disease), stage III ([place])    Complete heart block ([place])    a. Originally placed 08/2004; b. 07/2013 s/p MDT [place] PPM (ser # [id number]).   Dyspnea    Gait abnormality 07/23/2016   GERD (gastroesophageal reflux disease)    Gout    Hemoptysis 07/25/2020   History of hypertension    History of stroke    Hyperhomocysteinemia (HCC)    Hyperlipidemia    Hypertension    Hypokalemia 07/13/2018   Low blood magnesium 07/13/2018   Lung cancer ([place])    Pacemaker-Medtronic 08/07/2011   PAF (paroxysmal atrial fibrillation) ([place])    a. 07/2020 Device interrogation-->13.3% AF burden-->longest atrial high rate episode 24:26 mins-->AT vs AF. Not on [place].   Pulmonary hypertension ([place])    a. 06/2018 Echo: EF 60-65%, no rwma, nl RV fxn, RVSP 48.38mmHg. Mild AoV Ca2+.   Rhabdomyolysis    Stroke [facility])    left sided weakness, 07/28/20- no residual    Vomiting 07/13/2018    Medications:   No prior anticoagulation noted  Heparin Dosing Weight: 62 kg   Assessment: 86 y.o female presented with SOB/weakness. History of Afib -- not on anticoagulation. Troponin 317. Pharmacy has been consulted for heparin management for ACS.  Date Time aPTT/HL Rate/Comment 7/12 0533 0.27  Subthera; 750>900 un/hr       Baseline Labs: aPTT - 27s INR - 1.1 Hgb - 12.1>10.8 Plts - 65>57 Trop: 297  Goal of Therapy:  Heparin level 0.3-0.7 units/ml Monitor platelets by anticoagulation protocol: Yes   Plan:  Heparin level subtherapeutic @0 .27 (7/12 0533)  Bolus 900 units x1; then increase heparin infusion to 900 units/hr (~2.4 un/k/h increase) Check anti-Xa level in 8 hours and daily while on heparin Continue to monitor H&H and platelets  Lorna Dibble, PharmD Clinical Pharmacist   11/08/2020,7:36 AM

## 2020-11-08 NOTE — Consult Note (Signed)
ANTICOAGULATION CONSULT NOTE - Initial Consult  Pharmacy Consult for heparin infusion  Indication: chest pain/ACS  No Known Allergies  Patient Measurements: Height: 5\' 2"  (157.5 cm) Weight: 49 kg (108 lb 1.6 oz) IBW/kg (Calculated) : 50.1 Heparin Dosing Weight: 62 kg   Vital Signs: Temp: 98.1 F (36.7 C) (07/12 1600) Temp Source: Axillary (07/12 1100) BP: 123/73 (07/12 1600) Pulse Rate: 70 (07/12 1600)  Labs: Recent Labs    11/07/20 1925 11/07/20 2048 11/07/20 2109 11/08/20 0533 11/08/20 1635  HGB 12.1  --   --  10.8*  --   HCT 37.8  --   --  33.5*  --   PLT 65*  --   --  57*  --   APTT  --   --  27  --   --   LABPROT  --   --  14.3  --   --   INR  --   --  1.1  --   --   HEPARINUNFRC  --   --   --  0.27* 0.47  CREATININE 0.56  --   --  0.35*  --   TROPONINIHS 317* 297*  --   --   --      Estimated Creatinine Clearance: 39.8 mL/min (A) (by C-G formula based on SCr of 0.35 mg/dL (L)).   Medical History: Past Medical History:  Diagnosis Date   Breast cancer, right ([place]) 11/2018   Mastectomy   C. difficile colitis    CKD (chronic kidney disease), stage III ([place])    Complete heart block ([place])    a. Originally placed 08/2004; b. 07/2013 s/p MDT [place] PPM (ser # [id number]).   Dyspnea    Gait abnormality 07/23/2016   GERD (gastroesophageal reflux disease)    Gout    Hemoptysis 07/25/2020   History of hypertension    History of stroke    Hyperhomocysteinemia (HCC)    Hyperlipidemia    Hypertension    Hypokalemia 07/13/2018   Low blood magnesium 07/13/2018   Lung cancer ([place])    Pacemaker-Medtronic 08/07/2011   PAF (paroxysmal atrial fibrillation) ([place])    a. 07/2020 Device interrogation-->13.3% AF burden-->longest atrial high rate episode 24:26 mins-->AT vs AF. Not on [place].   Pulmonary hypertension ([place])    a. 06/2018 Echo: EF 60-65%, no rwma, nl RV fxn, RVSP 48.74mmHg. Mild AoV Ca2+.   Rhabdomyolysis    Stroke [facility])    left sided weakness,  07/28/20- no residual    Vomiting 07/13/2018    Medications:  No prior anticoagulation noted  Heparin Dosing Weight: 62 kg   Assessment: 87 y.o female presented with SOB/weakness. History of Afib -- not on anticoagulation. Troponin 317. Pharmacy has been consulted for heparin management for ACS.  Date Time aPTT/HL Rate/Comment 7/12 0533 0.27  Subthera; 750>900 un/hr   7/12 1635 0.47  Therapeutic x 1      Baseline Labs: aPTT - 27s INR - 1.1 Hgb - 12.1>10.8 Plts - 65>57 Trop: 297  Goal of Therapy:  Heparin level 0.3-0.7 units/ml Monitor platelets by anticoagulation protocol: Yes   Plan:  Heparin level therapeutic Continue heparin infusion at 900 units/hr  Check anti-Xa level in 8 hours and daily while on heparin Continue to monitor H&H and platelets  Dorothe Pea, PharmD, BCPS Clinical Pharmacist   11/08/2020,5:36 PM

## 2020-11-09 DIAGNOSIS — I214 Non-ST elevation (NSTEMI) myocardial infarction: Secondary | ICD-10-CM | POA: Diagnosis not present

## 2020-11-09 DIAGNOSIS — R5381 Other malaise: Secondary | ICD-10-CM

## 2020-11-09 DIAGNOSIS — Z950 Presence of cardiac pacemaker: Secondary | ICD-10-CM | POA: Diagnosis not present

## 2020-11-09 DIAGNOSIS — C50411 Malignant neoplasm of upper-outer quadrant of right female breast: Secondary | ICD-10-CM

## 2020-11-09 DIAGNOSIS — Z170 Estrogen receptor positive status [ER+]: Secondary | ICD-10-CM

## 2020-11-09 DIAGNOSIS — I480 Paroxysmal atrial fibrillation: Secondary | ICD-10-CM

## 2020-11-09 DIAGNOSIS — R778 Other specified abnormalities of plasma proteins: Secondary | ICD-10-CM | POA: Diagnosis not present

## 2020-11-09 DIAGNOSIS — I248 Other forms of acute ischemic heart disease: Secondary | ICD-10-CM

## 2020-11-09 LAB — BASIC METABOLIC PANEL
Anion gap: 4 — ABNORMAL LOW (ref 5–15)
BUN: 15 mg/dL (ref 8–23)
CO2: 24 mmol/L (ref 22–32)
Calcium: 7.6 mg/dL — ABNORMAL LOW (ref 8.9–10.3)
Chloride: 106 mmol/L (ref 98–111)
Creatinine, Ser: 0.53 mg/dL (ref 0.44–1.00)
GFR, Estimated: >60 mL/min (ref >60)
Glucose, Bld: 135 mg/dL — ABNORMAL HIGH (ref 70–99)
Potassium: 3.7 mmol/L (ref 3.5–5.1)
Sodium: 134 mmol/L — ABNORMAL LOW (ref 135–145)

## 2020-11-09 LAB — CBC
HCT: 30.6 % — ABNORMAL LOW (ref 36.0–46.0)
Hemoglobin: 9.8 g/dL — ABNORMAL LOW (ref 12.0–15.0)
MCH: 29.2 pg (ref 26.0–34.0)
MCHC: 32 g/dL (ref 30.0–36.0)
MCV: 91.1 fL (ref 80.0–100.0)
Platelets: 59 10*3/uL — ABNORMAL LOW (ref 150–400)
RBC: 3.36 MIL/uL — ABNORMAL LOW (ref 3.87–5.11)
RDW: 23.2 % — ABNORMAL HIGH (ref 11.5–15.5)
WBC: 2.2 10*3/uL — ABNORMAL LOW (ref 4.0–10.5)
nRBC: 0.9 % — ABNORMAL HIGH (ref 0.0–0.2)

## 2020-11-09 LAB — HEPARIN LEVEL (UNFRACTIONATED)
Heparin Unfractionated: 0.36 IU/mL (ref 0.30–0.70)
Heparin Unfractionated: 0.4 IU/mL (ref 0.30–0.70)

## 2020-11-09 LAB — MAGNESIUM: Magnesium: 1.8 mg/dL (ref 1.7–2.4)

## 2020-11-09 NOTE — Progress Notes (Signed)
Progress Note  Patient Name: [name] Date of Encounter: 11/09/2020  [facility] Cardiologist: Virl Axe, MD   Subjective   No acute events overnight, denies chest pain or shortness of breath.  Weakness is overall better.  Currently on heparin drip.  Inpatient Medications    Scheduled Meds:  atorvastatin  20 mg Oral Daily   clopidogrel  75 mg Oral Daily   dexamethasone  4 mg Oral Daily   diltiazem  120 mg Oral Daily   feeding supplement  237 mL Oral BID BM   ipratropium-albuterol  3 mL Nebulization TID   latanoprost  1 drop Both Eyes QHS   letrozole  2.5 mg Oral Daily   multivitamin with minerals  1 tablet Oral Daily   Continuous Infusions:  heparin 900 Units/hr (11/09/20 1154)   PRN Meds: acetaminophen, guaiFENesin-dextromethorphan, nitroGLYCERIN, ondansetron (ZOFRAN) IV   Vital Signs    Vitals:   11/09/20 0335 11/09/20 0722 11/09/20 0725 11/09/20 1138  BP: 108/61  121/70 125/68  Pulse: 60  60 (!) 59  Resp: 20  20 20   Temp: 98.2 F (36.8 C)  98 F (36.7 C) 98.2 F (36.8 C)  TempSrc: Oral  Oral   SpO2:  96% 100% 99%  Weight:      Height:        Intake/Output Summary (Last 24 hours) at 11/09/2020 1155 Last data filed at 11/09/2020 1154 Gross per 24 hour  Intake 925.18 ml  Output 900 ml  Net 25.18 ml   Last 3 Weights 11/08/2020 11/08/2020 11/07/2020  Weight (lbs) 108 lb 1.6 oz 107 lb 2.3 oz 107 lb 2.3 oz  Weight (kg) 49.034 kg 48.6 kg 48.6 kg      Telemetry    A sensed V paced rhythm- Personally Reviewed  ECG    No new EKG obtained- Personally Reviewed  Physical Exam   GEN: No acute distress.   Neck: No JVD Cardiac: RRR, no murmurs, rubs, or gallops.  Respiratory: Rhonchorous breath sounds GI: Soft, nontender, non-distended  MS: No edema; No deformity. Neuro:  Nonfocal  Psych: Normal affect   Labs    High Sensitivity Troponin:   Recent Labs  Lab 10/15/20 0032 10/15/20 1044 10/15/20 1430 11/07/20 1925 11/07/20 2048   TROPONINIHS 87* 56* 43* 317* 297*      Chemistry Recent Labs  Lab 11/07/20 1925 11/08/20 0533 11/09/20 0026  NA 134* 136 134*  K 4.2 3.5 3.7  CL 104 105 106  CO2 22 24 24   GLUCOSE 108* 84 135*  BUN 21 18 15   CREATININE 0.56 0.35* 0.53  CALCIUM 8.1* 7.7* 7.6*  GFRNONAA >60 >60 >60  ANIONGAP 8 7 4*     Hematology Recent Labs  Lab 11/07/20 1925 11/08/20 0533 11/09/20 0026  WBC 2.3* 2.1* 2.2*  RBC 4.22 3.76* 3.36*  HGB 12.1 10.8* 9.8*  HCT 37.8 33.5* 30.6*  MCV 89.6 89.1 91.1  MCH 28.7 28.7 29.2  MCHC 32.0 32.2 32.0  RDW 23.4* 23.4* 23.2*  PLT 65* 57* 59*    BNP Recent Labs  Lab 11/07/20 1925  BNP 151.4*     DDimer  Recent Labs  Lab 11/08/20 1110  DDIMER 1.10*     Radiology    DG Chest 2 View  Result Date: 11/07/2020 CLINICAL DATA:  Shortness of breath EXAM: CHEST - 2 VIEW COMPARISON:  10/14/2020 FINDINGS: Left-sided implanted cardiac device and right chest port remain in place. Heart size within normal limits. Atherosclerotic calcification of the aortic  knob. Low lung volumes. Soft tissue fullness in the right hilar region is similar to prior. Streaky right basilar opacity, slightly increased from prior. Left lung is clear. No pleural effusion or pneumothorax. IMPRESSION: Streaky right basilar opacity, slightly increased from prior. Findings may reflect atelectasis or infiltrate. Electronically Signed   By: Davina Poke D.O.   On: 11/07/2020 19:42    Cardiac Studies   Echo 09/2020 1. Left ventricular ejection fraction, by estimation, is 50 %. The left  ventricle has low normal function. The left ventricle demonstrates  regional wall motion abnormalities (septal wall motion abnormality,  possibly secondary to paced rhythym). Left  ventricular diastolic parameters are indeterminate.   2. Right ventricular systolic function was not well visualized. The right  ventricular size is normal.   Patient Profile     87 y.o. female with history of complete  heart block s/p pacemaker, CKD 3, lung cancer, paroxysmal A. fib presenting to the hospital with weakness, found to have elevated troponins.  Assessment & Plan    Elevated troponins -likely Secondary to demand supply mismatch -Denies chest pain or shortness of breath -Troponins 317, 297. -Okay to stop heparin -Continue Plavix. -Last echo with preserved ejection fraction.  2.  History of paroxysmal A. Fib -Not on anticoagulation due to pancytopenia, fall risk -Heart rate controlled -Continue PTA Cardizem  3.  CHB s/p pacemaker -Outpatient follow-up with device clinic  Patient is stable from a cardiac perspective.  No additional testing or intervention being planned.  Close follow-up with device clinic as outpatient.  Cardiology will sign off.  Please let us know if additional input is needed. Total encounter time 35 minutes  Greater than 50% was spent in counseling and coordination of care with the patient   Signed, Kate Sable, MD  11/09/2020, 11:55 AM

## 2020-11-09 NOTE — Consult Note (Signed)
ANTICOAGULATION CONSULT NOTE  Pharmacy Consult for heparin infusion  Indication: chest pain/ACS  No Known Allergies  Patient Measurements: Height: 5\' 2"  (157.5 cm) Weight: 49 kg (108 lb 1.6 oz) IBW/kg (Calculated) : 50.1 Heparin Dosing Weight: 62 kg   Vital Signs: Temp: 99.1 F (37.3 C) (07/12 1938) Temp Source: Oral (07/12 1938) BP: 152/71 (07/12 1938) Pulse Rate: 66 (07/12 1938)  Labs: Recent Labs    11/07/20 1925 11/07/20 2048 11/07/20 2109 11/08/20 0533 11/08/20 1635 11/09/20 0026  HGB 12.1  --   --  10.8*  --  9.8*  HCT 37.8  --   --  33.5*  --  30.6*  PLT 65*  --   --  57*  --  59*  APTT  --   --  27  --   --   --   LABPROT  --   --  14.3  --   --   --   INR  --   --  1.1  --   --   --   HEPARINUNFRC  --   --   --  0.27* 0.47 0.36  CREATININE 0.56  --   --  0.35*  --  0.53  TROPONINIHS 317* 297*  --   --   --   --      Estimated Creatinine Clearance: 39.8 mL/min (by C-G formula based on SCr of 0.53 mg/dL).   Medical History: Past Medical History:  Diagnosis Date   Breast cancer, right ([place]) 11/2018   Mastectomy   C. difficile colitis    CKD (chronic kidney disease), stage III ([place])    Complete heart block ([place])    a. Originally placed 08/2004; b. 07/2013 s/p MDT [place] PPM (ser # [id number]).   Dyspnea    Gait abnormality 07/23/2016   GERD (gastroesophageal reflux disease)    Gout    Hemoptysis 07/25/2020   History of hypertension    History of stroke    Hyperhomocysteinemia (HCC)    Hyperlipidemia    Hypertension    Hypokalemia 07/13/2018   Low blood magnesium 07/13/2018   Lung cancer ([place])    Pacemaker-Medtronic 08/07/2011   PAF (paroxysmal atrial fibrillation) ([place])    a. 07/2020 Device interrogation-->13.3% AF burden-->longest atrial high rate episode 24:26 mins-->AT vs AF. Not on [place].   Pulmonary hypertension ([place])    a. 06/2018 Echo: EF 60-65%, no rwma, nl RV fxn, RVSP 48.24mmHg. Mild AoV Ca2+.   Rhabdomyolysis    Stroke [facility])     left sided weakness, 07/28/20- no residual    Vomiting 07/13/2018    Medications:  No prior anticoagulation noted  Heparin Dosing Weight: 62 kg   Assessment: 87 y.o female presented with SOB/weakness. History of Afib -- not on anticoagulation. Troponin 317. Pharmacy has been consulted for heparin management for ACS.  Date Time aPTT/HL Rate/Comment 7/12 0533 0.27  Subthera; 750>900 un/hr   7/12 1635 0.47  Therapeutic x 1  7/13 0026 0.36  Therapeutic x 2    Baseline Labs: aPTT - 27s INR - 1.1 Hgb - 12.1>10.8 Plts - 65>57 Trop: 297  Goal of Therapy:  Heparin level 0.3-0.7 units/ml Monitor platelets by anticoagulation protocol: Yes   Plan:  Heparin level therapeutic Continue heparin infusion at 900 units/hr  Recheck daily with AM labs while on heparin Continue to monitor H&H and platelets  Renda Rolls, PharmD, [facility] 11/09/2020 1:15 AM

## 2020-11-09 NOTE — Discharge Summary (Signed)
Physician Discharge Summary  Patient ID: [name] MRN: [id number] DOB/AGE: [birth year] 87 y.o.  Admit date: 11/07/2020 Discharge date: 11/09/2020  Admission Diagnoses:  Discharge Diagnoses:  Active Problems:   Pacemaker-Medtronic   Atrial fibrillation (HCC)   History of CVA (cerebrovascular accident)   Essential hypertension   Malignant neoplasm of upper-outer quadrant of right breast in female, estrogen receptor positive ([place])   Squamous cell carcinoma of right lung (HCC)   CAD (coronary artery disease)   Leukopenia due to antineoplastic chemotherapy ([place])   Demand ischemia [facility])   Discharged Condition: good  Hospital Course:  [name] is a 87 y.o. female with medical history significant for CAD, pacemaker secondary to complete heart block, HTN, A. fib not on anticoagulation, CVA, breast cancer status post right mastectomy, non-small cell carcinoma of the lung on chemo and radiation, hospitalized from 6/17-6/25 with UTI and elevated troponin believed secondary to demand from tachyarrhythmia, who presents to the ED with a 1 week history of cough and weakness.     #1.  Elevated troponin secondary to demand ischemia. She has been seen by cardiology, does not believe she had non-STEMI.  She is on heparin drip, will continue until this evening.  She will be discharged home afterwards.  Currently she has no symptoms.  She denies any chest pain short of breath.  #2.  Obstructive lung mass. Patient does not have evidence of pneumonia.  Procalcitonin level 0.21, slightly higher due to cancer.  No evidence of infection.  3.  Pancytopenia. Secondary to chemotherapy, patient be followed by PCP.  4.  Paroxysmal atrial fibrillation. Patient is not on chronic anticoagulation.  5.  History of CVA. Continued on Plavix and atorvastatin.  6.  Malignant neoplasm of the right breast status post mastectomy. Squamous cell carcinoma of the right lung. Followed by oncology as  outpatient.  #7.  Severe debility. Patient be followed by home PT/OT.  Due to severity of her debility, I also prescribed a wheelchair and hospital bed.  #7.  Severe protein calorie malnutrition.     Consults: cardiology  Significant Diagnostic Studies:  CHEST - 2 VIEW   COMPARISON:  10/14/2020   FINDINGS: Left-sided implanted cardiac device and right chest port remain in place. Heart size within normal limits. Atherosclerotic calcification of the aortic knob. Low lung volumes. Soft tissue fullness in the right hilar region is similar to prior. Streaky right basilar opacity, slightly increased from prior. Left lung is clear. No pleural effusion or pneumothorax.   IMPRESSION: Streaky right basilar opacity, slightly increased from prior. Findings may reflect atelectasis or infiltrate.     Electronically Signed   By: Davina Poke D.O.   On: 11/07/2020 19:42   Treatments: IVV heparin  Discharge Exam: Blood pressure 121/70, pulse 60, temperature 98 F (36.7 C), temperature source Oral, resp. rate 20, height 5' 2"  (1.575 m), weight 49 kg, SpO2 100 %. General appearance: alert and cooperative Resp: clear to auscultation bilaterally Cardio: regular rate and rhythm, S1, S2 normal, no murmur, click, rub or gallop GI: soft, non-tender; bowel sounds normal; no masses,  no organomegaly Extremities: extremities normal, atraumatic, no cyanosis or edema  Disposition: Discharge disposition: 01-Home or Self Care       Discharge Instructions     Diet - low sodium heart healthy   Complete by: As directed    Increase activity slowly   Complete by: As directed       Allergies as of 11/09/2020   No Known Allergies  Medication List     STOP taking these medications    mirtazapine 7.5 MG tablet Commonly known as: REMERON   pantoprazole 40 MG tablet Commonly known as: PROTONIX       TAKE these medications    acetaminophen 500 MG tablet Commonly known  as: TYLENOL Take 500-1,000 mg by mouth every 6 (six) hours as needed for moderate pain.   alendronate 70 MG tablet Commonly known as: FOSAMAX TAKE 1 TABLET (70 MG TOTAL) BY MOUTH ONCE A WEEK. TAKE WITH A FULL GLASS OF WATER ON AN EMPTY STOMACH.   allopurinol 100 MG tablet Commonly known as: ZYLOPRIM Take 100 mg by mouth daily.   atorvastatin 20 MG tablet Commonly known as: Lipitor Take 1 tablet (20 mg total) by mouth daily.   Blood Pressure Kit Automated blood pressure measuring device.   calcium-vitamin D 250-100 MG-UNIT tablet Take 2 tablets by mouth daily.   clopidogrel 75 MG tablet Commonly known as: PLAVIX Take 75 mg by mouth daily.   dexamethasone 4 MG tablet Commonly known as: DECADRON Take 1 tablet (4 mg total) by mouth daily.   dextromethorphan-guaiFENesin 30-600 MG 12hr tablet Commonly known as: MUCINEX DM Take 1 tablet by mouth 2 (two) times daily.   diltiazem 120 MG 24 hr capsule Commonly known as: CARDIZEM CD Take 1 capsule (120 mg total) by mouth daily.   docusate sodium 100 MG capsule Commonly known as: COLACE Take 100 mg by mouth 2 (two) times daily as needed for mild constipation.   feeding supplement Liqd Take 237 mLs by mouth 3 (three) times daily between meals.   letrozole 2.5 MG tablet Commonly known as: FEMARA TAKE 1 TABLET BY MOUTH EVERY DAY   lidocaine-prilocaine cream Commonly known as: EMLA Apply to affected area once   Lumigan 0.01 % Soln Generic drug: bimatoprost Place 1 drop into both eyes at bedtime.   magnesium hydroxide 400 MG/5ML suspension Commonly known as: MILK OF MAGNESIA Take 30 mLs by mouth once. for constipation.   Magnesium Oxide 250 MG Tabs Take 250 mg by mouth daily.   multivitamin with minerals Tabs tablet Take 1 tablet by mouth daily.   ondansetron 8 MG tablet Commonly known as: Zofran Take 1 tablet (8 mg total) by mouth 2 (two) times daily as needed for refractory nausea / vomiting.   prochlorperazine  10 MG tablet Commonly known as: COMPAZINE Take 1 tablet (10 mg total) by mouth every 6 (six) hours as needed (Nausea or vomiting).               Durable Medical Equipment  (From admission, onward)           Start     Ordered   11/09/20 1006  For home use only DME lightweight manual wheelchair with seat cushion  Once       Comments: Patient suffers from metastatic cancer which impairs their ability to perform daily activities like bathing, dressing, and toileting in the home.  A walker will not resolve  issue with performing activities of daily living. A wheelchair will allow patient to safely perform daily activities. Patient is not able to propel themselves in the home using a standard weight wheelchair due to general weakness. Patient can self propel in the lightweight wheelchair. Length of need Lifetime. Accessories: elevating leg rests (ELRs), wheel locks, extensions and anti-tippers.   11/09/20 1006   11/09/20 1005  For home use only DME Hospital bed  Once       Question Answer  Comment  Length of Need Lifetime   Bed type Semi-electric      11/09/20 1006   11/09/20 1005  For home use only DME Bedside commode  Once       Question:  Patient needs a bedside commode to treat with the following condition  Answer:  Debility   11/09/20 1006            Follow-up Information     Philmore Pali, NP Follow up in 1 week(s).   Specialty: Nurse Practitioner Contact information: [place] [address] [phone]         [name], MD .   Specialty: Cardiology Contact information: [address] [place] [address] [phone]                34 minutes Signed: Sharen Hones 11/09/2020, 10:08 AM

## 2020-11-09 NOTE — Consult Note (Addendum)
ANTICOAGULATION CONSULT NOTE  Pharmacy Consult for heparin infusion  Indication: chest pain/ACS  No Known Allergies  Patient Measurements: Height: 5\' 2"  (157.5 cm) Weight: 49 kg (108 lb 1.6 oz) IBW/kg (Calculated) : 50.1 Heparin Dosing Weight: 62 kg   Vital Signs: Temp: 98 F (36.7 C) (07/13 0725) Temp Source: Oral (07/13 0725) BP: 121/70 (07/13 0725) Pulse Rate: 60 (07/13 0725)  Labs: Recent Labs    11/07/20 1925 11/07/20 1925 11/07/20 2048 11/07/20 2109 11/08/20 0533 11/08/20 1635 11/09/20 0026 11/09/20 0508  HGB 12.1  --   --   --  10.8*  --  9.8*  --   HCT 37.8  --   --   --  33.5*  --  30.6*  --   PLT 65*  --   --   --  57*  --  59*  --   APTT  --   --   --  27  --   --   --   --   LABPROT  --   --   --  14.3  --   --   --   --   INR  --   --   --  1.1  --   --   --   --   HEPARINUNFRC  --    < >  --   --  0.27* 0.47 0.36 0.40  CREATININE 0.56  --   --   --  0.35*  --  0.53  --   TROPONINIHS 317*  --  297*  --   --   --   --   --    < > = values in this interval not displayed.     Estimated Creatinine Clearance: 39.8 mL/min (by C-G formula based on SCr of 0.53 mg/dL).   Medical History: Past Medical History:  Diagnosis Date   Breast cancer, right ([place]) 11/2018   Mastectomy   C. difficile colitis    CKD (chronic kidney disease), stage III ([place])    Complete heart block ([place])    a. Originally placed 08/2004; b. 07/2013 s/p MDT [place] PPM (ser # [id number]).   Dyspnea    Gait abnormality 07/23/2016   GERD (gastroesophageal reflux disease)    Gout    Hemoptysis 07/25/2020   History of hypertension    History of stroke    Hyperhomocysteinemia (HCC)    Hyperlipidemia    Hypertension    Hypokalemia 07/13/2018   Low blood magnesium 07/13/2018   Lung cancer ([place])    Pacemaker-Medtronic 08/07/2011   PAF (paroxysmal atrial fibrillation) ([place])    a. 07/2020 Device interrogation-->13.3% AF burden-->longest atrial high rate episode 24:26 mins-->AT  vs AF. Not on [place].   Pulmonary hypertension ([place])    a. 06/2018 Echo: EF 60-65%, no rwma, nl RV fxn, RVSP 48.36mmHg. Mild AoV Ca2+.   Rhabdomyolysis    Stroke [facility])    left sided weakness, 07/28/20- no residual    Vomiting 07/13/2018    Medications:  No prior anticoagulation noted  Heparin Dosing Weight: 62 kg   Assessment: 87 y.o female presented with SOB/weakness. History of Afib -- not on anticoagulation. Troponin 317. NSTEMI ruled out, CP 2/2 to demand ischemia and will plan to complete 48hrs of AC then stop. Pharmacy has been consulted for heparin management for ACS.  Date Time aPTT/HL Rate/Comment 7/12 0533 0.27  Subthera; 750 > 900 un/hr   7/12 1635 0.47  Therapeutic x 1; 900 un/hr 7/13  0026 0.36  Therapeutic x 2; 900 un/hr 7/13 0508 0.40  Therapeutic x3; 900 un/hr  Baseline Labs: aPTT - 27s INR - 1.1 Hgb - 12.1>10.8 Plts - 65>57>59 Trop: 297  Goal of Therapy:  Heparin level 0.3-0.7 units/ml Monitor platelets by anticoagulation protocol: Yes   Plan:  Heparin level therapeutic x2 consecutively. Plan to continue heparin for total of 48hrs from start on 7/11 2100 will d/w cardiology consult service regarding end time.  Continue heparin infusion at 900 units/hr  Check anti-Xa level daily with AM labs while on heparin Continue to monitor H&H and platelets  Lorna Dibble, [facility] 11/09/2020 7:51 AM

## 2020-11-09 NOTE — Evaluation (Signed)
Physical Therapy Evaluation Patient Details Name: [name] MRN: [id number] DOB: Apr 23, 1934 Today's Date: 11/09/2020   History of Present Illness  Pt admitted for post obstuctive pneumonia with complaints of weakness and coughing. History includes CAD, pacemaker, HTN, Afiv, CVA, breast cancer s/p R mastectomy, and lung cancer. Recent hospital stay secondary to UTI from 6/17-6/25/22. Discharged from here to SNF prior to coming to ED.  Clinical Impression  Pt is a pleasant 87 year old female who was admitted for postobstructive pneumonia. Pt performs bed mobility with min assist, transfers with mod assist, and ambulation with min assist and RW. Pt demonstrates deficits with strength/mobility/endurance. Family in room at time of evaluation and agreeable to provide 24/7 assist at time of discharge. Good support at home, needs DME for successful home discharge. Would benefit from skilled PT to address above deficits and promote optimal return to PLOF. Recommend transition to [place] upon discharge from acute hospitalization.     Follow Up Recommendations Home health PT;Supervision/Assistance - 24 hour    Equipment Recommendations  Wheelchair (measurements PT);Hospital bed    Recommendations for Other Services       Precautions / Restrictions Precautions Precautions: Fall Restrictions Weight Bearing Restrictions: No      Mobility  Bed Mobility Overal bed mobility: Needs Assistance Bed Mobility: Supine to Sit     Supine to sit: Min assist     General bed mobility comments: able to self initiate sliding B LEs, min assist for trunkal elevation. Once seated at EOB, upright posture noted    Transfers Overall transfer level: Needs assistance Equipment used: Rolling walker (2 wheeled) Transfers: Sit to/from Stand Sit to Stand: Mod assist         General transfer comment: cues for hand placement. RW used. Once standing, demonstrates forward flexed  posture  Ambulation/Gait Ambulation/Gait assistance: Min assist Gait Distance (Feet): 3 Feet Assistive device: Rolling walker (2 wheeled) Gait Pattern/deviations: Step-to pattern;Shuffle;Decreased step length - right;Decreased step length - left     General Gait Details: short step to gait pattern with shuffle gait pattern over to recliner. Needs mod assist for repositioning back in chair.  Stairs            Wheelchair Mobility    Modified Rankin (Stroke Patients Only)       Balance Overall balance assessment: Needs assistance Sitting-balance support: Feet supported Sitting balance-Leahy Scale: Good     Standing balance support: Bilateral upper extremity supported;During functional activity Standing balance-Leahy Scale: Fair                               Pertinent Vitals/Pain Pain Assessment: No/denies pain    Home Living Family/patient expects to be discharged to:: Private residence Living Arrangements: Alone Available Help at Discharge: Family;Available PRN/intermittently;Other (Comment) (has aide assist for 2hr/day, family working on setting up 24/7 care) Type of Home: Apartment Home Access: Level entry (small threshold step to enter home)     Home Layout: One level Home Equipment: Grab bars - toilet;Grab bars - tub/shower;Tub bench;Walker - 2 wheels;Toilet riser;Bedside commode Additional Comments: reports she has recently been at SNF and has been declining physically, has been mostly bed bound.    Prior Function Level of Independence: Needs assistance   Gait / Transfers Assistance Needed: has been using RW for mobility.     Comments: most recently mostly bed bound     Hand Dominance        Extremity/Trunk  Assessment   Upper Extremity Assessment Upper Extremity Assessment: Generalized weakness (B grip strength 4/5; limited shoulder flexion to <90 degrees.)    Lower Extremity Assessment Lower Extremity Assessment: Generalized  weakness (B LE grossly 3+/5)       Communication   Communication: No difficulties  Cognition Arousal/Alertness: Awake/alert Behavior During Therapy: WFL for tasks assessed/performed Overall Cognitive Status: Within Functional Limits for tasks assessed                                        General Comments      Exercises Other Exercises Other Exercises: supine ther-ex performed on B LE including AP, SLRs, and hip abd/add. 10 reps with min assist and cues for attention to task   Assessment/Plan    PT Assessment Patient needs continued PT services  PT Problem List Decreased strength;Decreased activity tolerance;Decreased balance;Decreased mobility;Decreased safety awareness       PT Treatment Interventions DME instruction;Therapeutic activities;Gait training;Therapeutic exercise;Patient/family education;Stair training;Balance training;Functional mobility training    PT Goals (Current goals can be found in the Care Plan section)  Acute Rehab PT Goals Patient Stated Goal: to go home PT Goal Formulation: With patient Time For Goal Achievement: 11/23/20 Potential to Achieve Goals: Good    Frequency Min 2X/week   Barriers to discharge        Co-evaluation               AM-PAC PT "6 Clicks" Mobility  Outcome Measure Help needed turning from your back to your side while in a flat bed without using bedrails?: None Help needed moving from lying on your back to sitting on the side of a flat bed without using bedrails?: A Little Help needed moving to and from a bed to a chair (including a wheelchair)?: A Little Help needed standing up from a chair using your arms (e.g., wheelchair or bedside chair)?: A Little Help needed to walk in hospital room?: A Lot Help needed climbing 3-5 steps with a railing? : A Lot 6 Click Score: 17    End of Session Equipment Utilized During Treatment: Gait belt Activity Tolerance: Patient tolerated treatment well Patient  left: in chair;with call bell/phone within reach;with chair alarm set Nurse Communication: Mobility status PT Visit Diagnosis: Unsteadiness on feet (R26.81);Muscle weakness (generalized) (M62.81);History of falling (Z91.81);Difficulty in walking, not elsewhere classified (R26.2)    Time: 1027-2536 PT Time Calculation (min) (ACUTE ONLY): 32 min   Charges:   PT Evaluation $PT Eval Low Complexity: 1 Low PT Treatments $Therapeutic Exercise: 8-22 mins        Greggory Stallion, PT, DPT [phone]   Kalin Kyler 11/09/2020, 12:12 PM

## 2020-11-09 NOTE — Clinical Social Work Note (Cosign Needed Addendum)
    Durable Medical Equipment  (From admission, onward)           Start     Ordered   11/09/20 1006  For home use only DME lightweight manual wheelchair with seat cushion  Once       Comments: Patient suffers from metastatic cancer which impairs their ability to perform daily activities like bathing, dressing, and toileting in the home.  A walker will not resolve  issue with performing activities of daily living. A wheelchair will allow patient to safely perform daily activities. Patient is not able to propel themselves in the home using a standard weight wheelchair due to general weakness. Patient can self propel in the lightweight wheelchair. Length of need Lifetime. Accessories: elevating leg rests (ELRs), wheel locks, extensions and anti-tippers.   11/09/20 1006   11/09/20 1005  For home use only DME Hospital bed  Once       Question Answer Comment  Length of Need Lifetime   Bed type Semi-electric      11/09/20 1006   11/09/20 1005  For home use only DME Bedside commode  Once       Question:  Patient needs a bedside commode to treat with the following condition  Answer:  Debility   11/09/20 [name] Hospital Bed Narrative: Patient presents with CVA and requires the ability to reposition frequently, their head must be elevated greater than 30 degrees which can not be achieved in a normal bed.

## 2020-11-09 NOTE — TOC Progression Note (Signed)
Transition of Care [facility]) - Progression Note    Patient Details  Name: [name] MRN: [id number] Date of Birth: Oct 07, 1933  Transition of [facility]) CM/SW Contact  Eileen Stanford, LCSW Phone Number: 11/09/2020, 1:56 PM  Clinical Narrative:   [place] arranged thorough [facility]. They are aware pt is dc today. Hospital bed, wheelchair, and bedside commode will be delivered to the home this evening- ETA undetermined at this time. Zach with [employer] will update CSW as we know. Pt's daughter aware. CSW will arranged transport via [facility].         Expected Discharge Plan and Services           Expected Discharge Date: 11/09/20                                     Social Determinants of Health (SDOH) Interventions    Readmission Risk Interventions No flowsheet data found.

## 2020-11-12 LAB — CULTURE, BLOOD (ROUTINE X 2)
Culture: NO GROWTH
Culture: NO GROWTH
Special Requests: ADEQUATE

## 2020-11-14 ENCOUNTER — Other Ambulatory Visit: Payer: Self-pay | Admitting: Oncology

## 2020-11-14 DIAGNOSIS — C3491 Malignant neoplasm of unspecified part of right bronchus or lung: Secondary | ICD-10-CM

## 2020-11-14 NOTE — Progress Notes (Signed)
DISCONTINUE ON PATHWAY REGIMEN - Non-Small Cell Lung     Administer weekly:     Paclitaxel      Carboplatin   **Always confirm dose/schedule in your pharmacy ordering system**  REASON: Continuation Of Treatment PRIOR TREATMENT: [id number]: Carboplatin AUC=2 + Paclitaxel 45 mg/m2 Weekly During Radiation TREATMENT RESPONSE: Partial Response (PR)  START ON PATHWAY REGIMEN - Non-Small Cell Lung     A cycle is every 14 days:     Durvalumab   **Always confirm dose/schedule in your pharmacy ordering system**  Patient Characteristics: Preoperative or Nonsurgical Candidate (Clinical Staging), Stage III - Nonsurgical Candidate (Nonsquamous and Squamous), PS = 0, 1 Therapeutic Status: Preoperative or Nonsurgical Candidate (Clinical Staging) AJCC T Category: cT3 AJCC N Category: cN1 AJCC M Category: cM0 AJCC 8 Stage Grouping: IIIA ECOG Performance Status: 1 Intent of Therapy: Curative Intent, Discussed with Patient

## 2020-11-15 NOTE — Progress Notes (Signed)
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  INTERVAL HISTORY: Patient returns to clinic today for further evaluation and initiation of maintenance Imfinzi.  She has had several admissions to the hospital over the last several weeks, but feels improved and nearly back to her baseline.  She only complains of weakness or fatigue today.  She has no neurologic complaints.  She denies any recent fevers. She denies any chest pain, shortness of breath, hemoptysis, or cough.  She has a fair appetite, but denies weight loss.  She denies any nausea, vomiting, constipation, or diarrhea.  She has no urinary complaints.  Patient offers no further specific complaints today.  REVIEW OF SYSTEMS:   Review of Systems  Constitutional:  Positive for malaise/fatigue. Negative for fever and weight loss.  Respiratory: Negative.  Negative for cough, hemoptysis and shortness of breath.   Cardiovascular: Negative.  Negative for chest pain and leg swelling.  Gastrointestinal: Negative.  Negative for abdominal pain.  Genitourinary:  Negative for dysuria and frequency.  Musculoskeletal: Negative.  Negative for back pain.  Skin: Negative.  Negative for rash.  Neurological:  Positive for weakness. Negative for dizziness, focal weakness and headaches.  Psychiatric/Behavioral: Negative.  The patient is not nervous/anxious.    As per HPI. Otherwise, a complete review of systems is negative.  PAST  MEDICAL HISTORY: Past Medical History:  Diagnosis Date   Breast cancer, right ([place]) 11/2018   Mastectomy   C. difficile colitis    CKD (chronic kidney disease), stage III ([place])    Complete heart block ([place])    a. Originally placed 08/2004; b. 07/2013 s/p MDT [place] PPM (ser # [id number]).   Dyspnea    Gait abnormality 07/23/2016   GERD (gastroesophageal reflux disease)    Gout    Hemoptysis 07/25/2020   History of hypertension    History of stroke    Hyperhomocysteinemia (HCC)    Hyperlipidemia    Hypertension    Hypokalemia 07/13/2018   Low blood magnesium 07/13/2018   Lung cancer ([place])    Pacemaker-Medtronic 08/07/2011   PAF (paroxysmal atrial fibrillation) ([place])    a. 07/2020 Device interrogation-->13.3% AF burden-->longest atrial high rate episode 24:26 mins-->AT vs AF. Not on [place].   Pulmonary hypertension ([place])    a. 06/2018 Echo: EF 60-65%, no rwma, nl RV fxn, RVSP 48.67mmHg. Mild AoV Ca2+.   Rhabdomyolysis    Stroke [facility])    left sided weakness, 07/28/20- no residual    Vomiting 07/13/2018    PAST SURGICAL HISTORY: Past Surgical History:  Procedure Laterality Date   BIOPSY  07/15/2018   Procedure: BIOPSY;  Surgeon: [name], MD;  Location: [facility] ENDOSCOPY;  Service: Gastroenterology;;   BREAST BIOPSY Right 10/27/2018   affirm bx of mass, x clip,  INVASIVE MAMMARY CARCINOMA   BREAST LUMPECTOMY Right 12/03/2018   BRONCHIAL BIOPSY  07/29/2020   Procedure: BRONCHIAL BIOPSIES;  Surgeon: Laurin Coder, MD;  Location: [facility];  Service: Pulmonary;;   BRONCHIAL NEEDLE ASPIRATION BIOPSY  07/29/2020   Procedure: BRONCHIAL NEEDLE ASPIRATION BIOPSIES;  Surgeon: Laurin Coder, MD;  Location: [facility] [place] [facility];  Service: Pulmonary;;   CARDIAC CATHETERIZATION     ENDOBRONCHIAL ULTRASOUND N/A 07/29/2020   Procedure: ENDOBRONCHIAL ULTRASOUND;  Surgeon: Laurin Coder, MD;  Location: [facility]OSCOPY;  Service: Pulmonary;  Laterality: N/A;   ESOPHAGOGASTRODUODENOSCOPY  Left 07/15/2018   Procedure: ESOPHAGOGASTRODUODENOSCOPY (EGD);  Surgeon: [name], MD;  Location: [facility];  Service: Gastroenterology;  Laterality: Left;   HEMOSTASIS CONTROL  07/29/2020   Procedure: HEMOSTASIS CONTROL;  Surgeon: Laurin Coder, MD;  Location: [facility]COPY;  Service: Pulmonary;;   PACEMAKER INSERTION     Medtronic Enpulse dual-chamber pacemaker   PARTIAL MASTECTOMY WITH NEEDLE LOCALIZATION AND AXILLARY SENTINEL LYMPH NODE BX Right 12/03/2018   Procedure: PARTIAL MASTECTOMY WITH NEEDLE LOCALIZATION AND AXILLARY SENTINEL LYMPH NODE BX, RIGHT;  Surgeon: [name], MD;  Location: [facility] ORS;  Service: General;  Laterality: Right;   PERMANENT PACEMAKER GENERATOR CHANGE N/A 07/30/2013   Procedure: PERMANENT PACEMAKER GENERATOR CHANGE;  Surgeon: [name], MD;  Location: [facility] CATH LAB;  Service: Cardiovascular;  Laterality: N/A;   PORTA CATH INSERTION N/A 08/16/2020   Procedure: PORTA CATH INSERTION;  Surgeon: Katha Cabal, MD;  Location: [facility] CV LAB;  Service: Cardiovascular;  Laterality: N/A;   VIDEO BRONCHOSCOPY N/A 07/29/2020   Procedure: VIDEO BRONCHOSCOPY WITH FLUORO;  Surgeon: Laurin Coder, MD;  Location: [facility] ENDOSCOPY;  Service: Pulmonary;  Laterality: N/A;    FAMILY HISTORY: Family History  Problem Relation Age of Onset   Hypertension Mother    Breast cancer Sister    Breast cancer Sister    Colon cancer Neg Hx    Esophageal cancer Neg Hx     ADVANCED DIRECTIVES (Y/N):  N  HEALTH MAINTENANCE: Social History   Tobacco Use   Smoking status: Former    Types: Cigarettes    Quit date: 07/25/2006    Years since quitting: 14.3   Smokeless tobacco: Never  Vaping Use   Vaping Use: Never used  Substance Use Topics   Alcohol use: No   Drug use: No     Colonoscopy:  PAP:  Bone density:  Lipid panel:  No Known Allergies  Current Outpatient Medications  Medication Sig Dispense Refill   acetaminophen (TYLENOL) 500 MG  tablet Take 500-1,000 mg by mouth every 6 (six) hours as needed for moderate pain.      alendronate (FOSAMAX) 70 MG tablet TAKE 1 TABLET (70 MG TOTAL) BY MOUTH ONCE A WEEK. TAKE WITH A FULL GLASS OF WATER ON AN EMPTY STOMACH. 12 tablet 2   allopurinol (ZYLOPRIM) 100 MG tablet Take 100 mg by mouth daily.     atorvastatin (LIPITOR) 20 MG tablet Take 1 tablet (20 mg total) by mouth daily. 30 tablet 0   Blood Pressure KIT Automated blood pressure measuring device. 1 each 0   calcium-vitamin D 250-100 MG-UNIT tablet Take 2 tablets by mouth daily.     clopidogrel (PLAVIX) 75 MG tablet Take 75 mg by mouth daily.     dexamethasone (DECADRON) 4 MG tablet Take 1 tablet (4 mg total) by mouth daily. 30 tablet 1   dextromethorphan-guaiFENesin (MUCINEX DM) 30-600 MG 12hr tablet Take 1 tablet by mouth 2 (two) times daily. 30 tablet 0   diltiazem (CARDIZEM CD) 120 MG 24 hr capsule Take 1 capsule (120 mg total) by mouth daily. 30 capsule 0   docusate sodium (COLACE) 100 MG capsule Take 100 mg by  mouth 2 (two) times daily as needed for mild constipation.     feeding supplement (ENSURE ENLIVE / ENSURE PLUS) LIQD Take 237 mLs by mouth 3 (three) times daily between meals. 10000 mL 0   letrozole (FEMARA) 2.5 MG tablet TAKE 1 TABLET BY MOUTH EVERY DAY 90 tablet 3   LUMIGAN 0.01 % SOLN Place 1 drop into both eyes at bedtime.  4   magnesium hydroxide (MILK OF MAGNESIA) 400 MG/5ML suspension Take 30 mLs by mouth once. for constipation.     Magnesium Oxide 250 MG TABS Take 250 mg by mouth daily.     Multiple Vitamin (MULTIVITAMIN WITH MINERALS) TABS tablet Take 1 tablet by mouth daily. 30 tablet 0   No current facility-administered medications for this visit.   Facility-Administered Medications Ordered in Other Visits  Medication Dose Route Frequency Provider Last Rate Last Admin   sodium chloride flush (NS) 0.9 % injection 10 mL  10 mL Intravenous PRN [name], MD   10 mL at 09/13/20 6962     OBJECTIVE: There were no vitals filed for this visit.    There is no height or weight on file to calculate BMI.    ECOG FS:2 - Symptomatic, <50% confined to bed  General: Well-developed, well-nourished, no acute distress.  Sitting in a wheelchair. Eyes: Pink conjunctiva, anicteric sclera. HEENT: Normocephalic, moist mucous membranes. Lungs: No audible wheezing or coughing. Heart: Regular rate and rhythm. Abdomen: Soft, nontender, no obvious distention. Musculoskeletal: No edema, cyanosis, or clubbing. Neuro: Alert, answering all questions appropriately. Cranial nerves grossly intact. Skin: No rashes or petechiae noted. Psych: Normal affect.  LAB RESULTS:  Lab Results  Component Value Date   NA 134 (L) 11/09/2020   K 3.7 11/09/2020   CL 106 11/09/2020   CO2 24 11/09/2020   GLUCOSE 135 (H) 11/09/2020   BUN 15 11/09/2020   CREATININE 0.53 11/09/2020   CALCIUM 7.6 (L) 11/09/2020   PROT 6.2 (L) 10/17/2020   ALBUMIN 2.9 (L) 10/17/2020   AST 33 10/17/2020   ALT 37 10/17/2020   ALKPHOS 37 (L) 10/17/2020   BILITOT 0.7 10/17/2020   GFRNONAA >60 11/09/2020   GFRAA >60 07/24/2018    Lab Results  Component Value Date   WBC 2.2 (L) 11/09/2020   NEUTROABS 2.0 11/07/2020   HGB 9.8 (L) 11/09/2020   HCT 30.6 (L) 11/09/2020   MCV 91.1 11/09/2020   PLT 59 (L) 11/09/2020     STUDIES: DG Chest 2 View  Result Date: 11/07/2020 CLINICAL DATA:  Shortness of breath EXAM: CHEST - 2 VIEW COMPARISON:  10/14/2020 FINDINGS: Left-sided implanted cardiac device and right chest port remain in place. Heart size within normal limits. Atherosclerotic calcification of the aortic knob. Low lung volumes. Soft tissue fullness in the right hilar region is similar to prior. Streaky right basilar opacity, slightly increased from prior. Left lung is clear. No pleural effusion or pneumothorax. IMPRESSION: Streaky right basilar opacity, slightly increased from prior. Findings may reflect atelectasis or  infiltrate. Electronically Signed   By: Davina Poke D.O.   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  PLAN:    1.  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  CT scan results from July 25, 2020 reviewed independently.  Patient underwent bronchoscopy on July 29, 2020 confirming the diagnosis.  PET scan results from August 15, 2020 reviewed independently confirming stage of disease.  CT of the head did not reveal any metastatic lesions.  Patient completed 5 weekly cycles of carboplatinum and Taxol on October 10, 2020 and XRT on October 20, 2020. Plan was to proceed with cycle 1 of maintenance Imfinzi today, but secondary to insurance purposes this had to be delayed and she will initiate cycle 1 in 1 week.    2.  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  Given her advanced age and small size of malignancy, Oncotype DX or adjuvant chemotherapy was not necessary.  She was also evaluated by radiation oncology who determined that adjuvant XRT was also not needed.  Continue letrozole for a total of 5 years completing treatment in August 2025.  Although we will likely discontinue treatment while she is receiving chemotherapy for her lung cancer.  Her most recent mammogram on November 13, 2019 was reported as BI-RADS 2, repeat in July 2022.    2.  Osteoporosis: Patient's most recent bone mineral density on January 14, 2020 reported T score of -2.6 which is mildly improved over 1 year prior where her T score was reported at -2.8.  She is tolerating letrozole well so we will continue this medication as prescribed.  Continue Fosamax, calcium, and vitamin D supplementation.  Repeat bone mineral density in  September 2022.   3.  Anemia: Patient's hemoglobin has trended down and is now 9.8.  Monitor. 4.  Hemoptysis: Resolved. 5.  Hypokalemia: Resolved.  Continue new oral potassium supplementation. 6.  Weight loss: Patient has declined dietary referral.  Continue dexamethasone 7.  Leukopenia/neutropenia: Chronic and unchanged.  Monitor. 8.  Thrombocytopenia: Patient's most recent platelet count was 59.  Okay to proceed with treatment as planned.  Repeat laboratory work next week.  Patient expressed understanding and was in agreement with this plan. She also understands that She can call clinic at any time with any questions, concerns, or complaints.   Cancer Staging Malignant neoplasm of upper-outer quadrant of right breast in female, estrogen receptor positive ([place]) Staging form: Breast, AJCC 8th Edition - Clinical stage from 11/06/2018: Stage IA (cT1a, cN0, cM0, G1, ER+, PR+, HER2-) - Signed by [name], MD on 11/06/2018 Histologic grading system: 3 grade system  Squamous cell carcinoma of right lung ([place]) Staging form: Lung, AJCC 8th Edition - Pathologic stage from 08/05/2020: Stage IIIA (pT3, pN1, cM0) - Signed by [name], MD on 08/05/2020 Stage prefix: Initial diagnosis   [name], MD   11/15/2020 7:08 PM

## 2020-11-16 ENCOUNTER — Inpatient Hospital Stay: Payer: Medicare HMO

## 2020-11-16 ENCOUNTER — Inpatient Hospital Stay: Payer: Medicare HMO | Attending: Oncology | Admitting: Oncology

## 2020-11-16 ENCOUNTER — Encounter: Payer: Self-pay | Admitting: Oncology

## 2020-11-16 VITALS — BP 126/85 | HR 67 | Temp 98.1°F | Wt 141.0 lb

## 2020-11-16 DIAGNOSIS — N183 Chronic kidney disease, stage 3 unspecified: Secondary | ICD-10-CM | POA: Diagnosis not present

## 2020-11-16 DIAGNOSIS — Z5112 Encounter for antineoplastic immunotherapy: Secondary | ICD-10-CM | POA: Diagnosis not present

## 2020-11-16 DIAGNOSIS — Z79899 Other long term (current) drug therapy: Secondary | ICD-10-CM | POA: Insufficient documentation

## 2020-11-16 DIAGNOSIS — Z8673 Personal history of transient ischemic attack (TIA), and cerebral infarction without residual deficits: Secondary | ICD-10-CM | POA: Insufficient documentation

## 2020-11-16 DIAGNOSIS — I10 Essential (primary) hypertension: Secondary | ICD-10-CM | POA: Insufficient documentation

## 2020-11-16 DIAGNOSIS — C3491 Malignant neoplasm of unspecified part of right bronchus or lung: Secondary | ICD-10-CM | POA: Insufficient documentation

## 2020-11-16 DIAGNOSIS — E785 Hyperlipidemia, unspecified: Secondary | ICD-10-CM | POA: Insufficient documentation

## 2020-11-16 DIAGNOSIS — Z79811 Long term (current) use of aromatase inhibitors: Secondary | ICD-10-CM | POA: Diagnosis not present

## 2020-11-16 DIAGNOSIS — Z87891 Personal history of nicotine dependence: Secondary | ICD-10-CM | POA: Diagnosis not present

## 2020-11-16 DIAGNOSIS — I480 Paroxysmal atrial fibrillation: Secondary | ICD-10-CM | POA: Insufficient documentation

## 2020-11-16 DIAGNOSIS — C50411 Malignant neoplasm of upper-outer quadrant of right female breast: Secondary | ICD-10-CM | POA: Insufficient documentation

## 2020-11-16 DIAGNOSIS — Z803 Family history of malignant neoplasm of breast: Secondary | ICD-10-CM | POA: Diagnosis not present

## 2020-11-16 DIAGNOSIS — Z8249 Family history of ischemic heart disease and other diseases of the circulatory system: Secondary | ICD-10-CM | POA: Diagnosis not present

## 2020-11-16 DIAGNOSIS — Z7952 Long term (current) use of systemic steroids: Secondary | ICD-10-CM | POA: Diagnosis not present

## 2020-11-16 DIAGNOSIS — Z170 Estrogen receptor positive status [ER+]: Secondary | ICD-10-CM | POA: Diagnosis not present

## 2020-11-16 LAB — CBC WITH DIFFERENTIAL/PLATELET
Abs Immature Granulocytes: 0.23 10*3/uL — ABNORMAL HIGH (ref 0.00–0.07)
Basophils Absolute: 0 10*3/uL (ref 0.0–0.1)
Basophils Relative: 0 %
Eosinophils Absolute: 0 10*3/uL (ref 0.0–0.5)
Eosinophils Relative: 0 %
HCT: 36.7 % (ref 36.0–46.0)
Hemoglobin: 11.3 g/dL — ABNORMAL LOW (ref 12.0–15.0)
Immature Granulocytes: 5 %
Lymphocytes Relative: 14 %
Lymphs Abs: 0.7 10*3/uL (ref 0.7–4.0)
MCH: 28.3 pg (ref 26.0–34.0)
MCHC: 30.8 g/dL (ref 30.0–36.0)
MCV: 92 fL (ref 80.0–100.0)
Monocytes Absolute: 0.3 10*3/uL (ref 0.1–1.0)
Monocytes Relative: 5 %
Neutro Abs: 3.8 10*3/uL (ref 1.7–7.7)
Neutrophils Relative %: 76 %
Platelets: 116 10*3/uL — ABNORMAL LOW (ref 150–400)
RBC: 3.99 MIL/uL (ref 3.87–5.11)
RDW: 23.3 % — ABNORMAL HIGH (ref 11.5–15.5)
WBC: 5.1 10*3/uL (ref 4.0–10.5)
nRBC: 3.2 % — ABNORMAL HIGH (ref 0.0–0.2)

## 2020-11-16 LAB — COMPREHENSIVE METABOLIC PANEL
ALT: 65 U/L — ABNORMAL HIGH (ref 0–44)
AST: 28 U/L (ref 15–41)
Albumin: 3.2 g/dL — ABNORMAL LOW (ref 3.5–5.0)
Alkaline Phosphatase: 54 U/L (ref 38–126)
Anion gap: 9 (ref 5–15)
BUN: 22 mg/dL (ref 8–23)
CO2: 27 mmol/L (ref 22–32)
Calcium: 8.5 mg/dL — ABNORMAL LOW (ref 8.9–10.3)
Chloride: 100 mmol/L (ref 98–111)
Creatinine, Ser: 0.5 mg/dL (ref 0.44–1.00)
GFR, Estimated: >60 mL/min (ref >60)
Glucose, Bld: 106 mg/dL — ABNORMAL HIGH (ref 70–99)
Potassium: 3.4 mmol/L — ABNORMAL LOW (ref 3.5–5.1)
Sodium: 136 mmol/L (ref 135–145)
Total Bilirubin: 0.7 mg/dL (ref 0.3–1.2)
Total Protein: 6.3 g/dL — ABNORMAL LOW (ref 6.5–8.1)

## 2020-11-16 LAB — TSH: TSH: 1.011 u[IU]/mL (ref 0.350–4.500)

## 2020-11-16 MED ORDER — HEPARIN SOD (PORK) LOCK FLUSH 100 UNIT/ML IV SOLN
INTRAVENOUS | Status: AC
  Filled 2020-11-16: qty 5

## 2020-11-16 MED ORDER — SODIUM CHLORIDE 0.9% FLUSH
10.0000 mL | Freq: Once | INTRAVENOUS | Status: AC
  Administered 2020-11-16: 10 mL via INTRAVENOUS
  Filled 2020-11-16: qty 10

## 2020-11-16 MED ORDER — HEPARIN SOD (PORK) LOCK FLUSH 100 UNIT/ML IV SOLN
500.0000 [IU] | Freq: Once | INTRAVENOUS | Status: AC
  Filled 2020-11-16: qty 5

## 2020-11-16 NOTE — Progress Notes (Signed)
Patient here for oncology follow-up appointment, concerns of weakness and cough

## 2020-11-16 NOTE — Progress Notes (Signed)
Nutrition  RD planning to see patient in infusion today for follow-up but patient released from infusion prior to RD being able to see patient.    Halston Kintz [name] Resides, [place], [place] Registered Dietitian [phone] (mobile)

## 2020-11-17 LAB — T4: T4, Total: 5.1 ug/dL (ref 4.5–12.0)

## 2020-11-21 NOTE — Progress Notes (Signed)
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  INTERVAL HISTORY: Patient returns to clinic today for further evaluation and initiation of maintenance Imfinzi.  Treatment was delayed 1 week secondary to approval by insurance.  She continues to have weakness and fatigue, but this continues to improve.  She has no neurologic complaints.  She denies any recent fevers. She denies any chest pain, shortness of breath, hemoptysis, or cough.  She has a fair appetite, but denies weight loss.  She denies any nausea, vomiting, constipation, or diarrhea.  She has no urinary complaints.  Patient offers no further specific complaints today.  REVIEW OF SYSTEMS:   Review of Systems  Constitutional:  Positive for malaise/fatigue. Negative for fever and weight loss.  Respiratory: Negative.  Negative for cough, hemoptysis and shortness of breath.   Cardiovascular: Negative.  Negative for chest pain and leg swelling.  Gastrointestinal: Negative.  Negative for abdominal pain.  Genitourinary:  Negative for dysuria and frequency.  Musculoskeletal: Negative.  Negative for back pain.  Skin: Negative.  Negative for rash.  Neurological:  Positive for weakness. Negative for dizziness, focal weakness and headaches.  Psychiatric/Behavioral: Negative.  The patient is not nervous/anxious.    As per HPI. Otherwise, a complete review of systems is negative.  PAST MEDICAL HISTORY: Past Medical History:   Diagnosis Date   Breast cancer, right ([place]) 11/2018   Mastectomy   C. difficile colitis    CKD (chronic kidney disease), stage III ([place])    Complete heart block ([place])    a. Originally placed 08/2004; b. 07/2013 s/p MDT [place] PPM (ser # [id number]).   Dyspnea    Gait abnormality 07/23/2016   GERD (gastroesophageal reflux disease)    Gout    Hemoptysis 07/25/2020   History of hypertension    History of stroke    Hyperhomocysteinemia (HCC)    Hyperlipidemia    Hypertension    Hypokalemia 07/13/2018   Low blood magnesium 07/13/2018   Lung cancer ([place])    Pacemaker-Medtronic 08/07/2011   PAF (paroxysmal atrial fibrillation) ([place])    a. 07/2020 Device interrogation-->13.3% AF burden-->longest atrial high rate episode 24:26 mins-->AT vs AF. Not on [place].   Pulmonary hypertension ([place])    a. 06/2018 Echo: EF 60-65%, no rwma, nl RV fxn, RVSP 48.53mHg. Mild AoV Ca2+.   Rhabdomyolysis    Stroke ([facility]    left sided weakness, 07/28/20- no residual    Vomiting 07/13/2018    PAST SURGICAL HISTORY: Past Surgical History:  Procedure Laterality Date   BIOPSY  07/15/2018   Procedure: BIOPSY;  Surgeon: GGatha Mayer MD;  Location: [facility] ENDOSCOPY;  Service: Gastroenterology;;   BREAST BIOPSY Right 10/27/2018   affirm bx of mass, x clip,  INVASIVE MAMMARY CARCINOMA   BREAST LUMPECTOMY Right 12/03/2018   BRONCHIAL BIOPSY  07/29/2020   Procedure: BRONCHIAL BIOPSIES;  Surgeon: OLaurin Coder MD;  [facility];  Service: Pulmonary;;   BRONCHIAL NEEDLE ASPIRATION BIOPSY  07/29/2020   Procedure: BRONCHIAL NEEDLE ASPIRATION  BIOPSIES;  Surgeon: Laurin Coder, MD;  Location: [facility] ENDOSCOPY;  Service: Pulmonary;;   CARDIAC CATHETERIZATION     ENDOBRONCHIAL ULTRASOUND N/A 07/29/2020   Procedure: ENDOBRONCHIAL ULTRASOUND;  Surgeon: Laurin Coder, MD;  Location: [facility] ENDOSCOPY;  Service: Pulmonary;  Laterality: N/A;   ESOPHAGOGASTRODUODENOSCOPY Left 07/15/2018   Procedure:  ESOPHAGOGASTRODUODENOSCOPY (EGD);  Surgeon: [name], MD;  Location: [facility];  Service: Gastroenterology;  Laterality: Left;   HEMOSTASIS CONTROL  07/29/2020   Procedure: HEMOSTASIS CONTROL;  Surgeon: Laurin Coder, MD;  Location: [facility] ENDOSCOPY;  Service: Pulmonary;;   PACEMAKER INSERTION     Medtronic Enpulse dual-chamber pacemaker   PARTIAL MASTECTOMY WITH NEEDLE LOCALIZATION AND AXILLARY SENTINEL LYMPH NODE BX Right 12/03/2018   Procedure: PARTIAL MASTECTOMY WITH NEEDLE LOCALIZATION AND AXILLARY SENTINEL LYMPH NODE BX, RIGHT;  Surgeon: [name], MD;  Location: [facility] ORS;  Service: General;  Laterality: Right;   PERMANENT PACEMAKER GENERATOR CHANGE N/A 07/30/2013   Procedure: PERMANENT PACEMAKER GENERATOR CHANGE;  Surgeon: [name], MD;  Location: [facility] CATH LAB;  Service: Cardiovascular;  Laterality: N/A;   PORTA CATH INSERTION N/A 08/16/2020   Procedure: PORTA CATH INSERTION;  Surgeon: Katha Cabal, MD;  Location: [facility] CV LAB;  Service: Cardiovascular;  Laterality: N/A;   VIDEO BRONCHOSCOPY N/A 07/29/2020   Procedure: VIDEO BRONCHOSCOPY WITH FLUORO;  Surgeon: Laurin Coder, MD;  Location: [facility] ENDOSCOPY;  Service: Pulmonary;  Laterality: N/A;    FAMILY HISTORY: Family History  Problem Relation Age of Onset   Hypertension Mother    Breast cancer Sister    Breast cancer Sister    Colon cancer Neg Hx    Esophageal cancer Neg Hx     ADVANCED DIRECTIVES (Y/N):  N  HEALTH MAINTENANCE: Social History   Tobacco Use   Smoking status: Former    Types: Cigarettes    Quit date: 07/25/2006    Years since quitting: 14.3   Smokeless tobacco: Never  Vaping Use   Vaping Use: Never used  Substance Use Topics   Alcohol use: No   Drug use: No     Colonoscopy:  PAP:  Bone density:  Lipid panel:  No Known Allergies  Current Outpatient Medications  Medication Sig Dispense Refill   acetaminophen (TYLENOL) 500 MG tablet Take 500-1,000 mg by mouth  every 6 (six) hours as needed for moderate pain.      alendronate (FOSAMAX) 70 MG tablet TAKE 1 TABLET (70 MG TOTAL) BY MOUTH ONCE A WEEK. TAKE WITH A FULL GLASS OF WATER ON AN EMPTY STOMACH. 12 tablet 2   allopurinol (ZYLOPRIM) 100 MG tablet Take 100 mg by mouth daily.     atorvastatin (LIPITOR) 20 MG tablet Take 1 tablet (20 mg total) by mouth daily. 30 tablet 0   Blood Pressure KIT Automated blood pressure measuring device. 1 each 0   calcium-vitamin D 250-100 MG-UNIT tablet Take 2 tablets by mouth daily.     clopidogrel (PLAVIX) 75 MG tablet Take 75 mg by mouth daily.     dexamethasone (DECADRON) 4 MG tablet Take 1 tablet (4 mg total) by mouth daily. 30 tablet 1   dextromethorphan-guaiFENesin (MUCINEX DM) 30-600 MG 12hr tablet Take 1 tablet by mouth 2 (two) times daily. 30 tablet 0   diltiazem (CARDIZEM CD) 120 MG 24 hr capsule Take 1 capsule (120 mg total) by mouth daily. 30 capsule 0   docusate sodium (COLACE) 100 MG capsule Take 100 mg by mouth 2 (two) times daily as needed  for mild constipation.     feeding supplement (ENSURE ENLIVE / ENSURE PLUS) LIQD Take 237 mLs by mouth 3 (three) times daily between meals. 10000 mL 0   letrozole (FEMARA) 2.5 MG tablet TAKE 1 TABLET BY MOUTH EVERY DAY 90 tablet 3   LUMIGAN 0.01 % SOLN Place 1 drop into both eyes at bedtime.  4   magnesium hydroxide (MILK OF MAGNESIA) 400 MG/5ML suspension Take 30 mLs by mouth once. for constipation.     mirtazapine (REMERON) 7.5 MG tablet Take 7.5 mg by mouth at bedtime.     Multiple Vitamin (MULTIVITAMIN WITH MINERALS) TABS tablet Take 1 tablet by mouth daily. 30 tablet 0   Magnesium Oxide 250 MG TABS Take 250 mg by mouth daily. (Patient not taking: Reported on 11/23/2020)     No current facility-administered medications for this visit.   Facility-Administered Medications Ordered in Other Visits  Medication Dose Route Frequency Provider Last Rate Last Admin   heparin lock flush 100 unit/mL  500 Units Intravenous  Once [name], MD       sodium chloride flush (NS) 0.9 % injection 10 mL  10 mL Intravenous PRN [name], MD   10 mL at 09/13/20 0904    OBJECTIVE: Vitals:   11/23/20 0935  BP: 135/86  Pulse: 61  Resp: 18  Temp: 98.3 F (36.8 C)     Body mass index is 23.54 kg/m.    ECOG FS:2 - Symptomatic, <50% confined to bed  General: Well-developed, well-nourished, no acute distress.  Sitting in a wheelchair. Eyes: Pink conjunctiva, anicteric sclera. HEENT: Normocephalic, moist mucous membranes. Lungs: No audible wheezing or coughing. Heart: Regular rate and rhythm. Abdomen: Soft, nontender, no obvious distention. Musculoskeletal: No edema, cyanosis, or clubbing. Neuro: Alert, answering all questions appropriately. Cranial nerves grossly intact. Skin: No rashes or petechiae noted. Psych: Normal affect.   LAB RESULTS:  Lab Results  Component Value Date   NA 133 (L) 11/23/2020   K 4.2 11/23/2020   CL 100 11/23/2020   CO2 24 11/23/2020   GLUCOSE 145 (H) 11/23/2020   BUN 27 (H) 11/23/2020   CREATININE 0.80 11/23/2020   CALCIUM 8.2 (L) 11/23/2020   PROT 6.8 11/23/2020   ALBUMIN 3.6 11/23/2020   AST 29 11/23/2020   ALT 35 11/23/2020   ALKPHOS 44 11/23/2020   BILITOT 1.0 11/23/2020   GFRNONAA >60 11/23/2020   GFRAA >60 07/24/2018    Lab Results  Component Value Date   WBC 7.2 11/23/2020   NEUTROABS 5.2 11/23/2020   HGB 12.8 11/23/2020   HCT 40.3 11/23/2020   MCV 93.7 11/23/2020   PLT 160 11/23/2020     STUDIES: DG Chest 2 View  Result Date: 11/07/2020 CLINICAL DATA:  Shortness of breath EXAM: CHEST - 2 VIEW COMPARISON:  10/14/2020 FINDINGS: Left-sided implanted cardiac device and right chest port remain in place. Heart size within normal limits. Atherosclerotic calcification of the aortic knob. Low lung volumes. Soft tissue fullness in the right hilar region is similar to prior. Streaky right basilar opacity, slightly increased from prior. Left lung is  clear. No pleural effusion or pneumothorax. IMPRESSION: Streaky right basilar opacity, slightly increased from prior. Findings may reflect atelectasis or infiltrate. Electronically Signed   By: Davina Poke D.O.   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  Now with stage IIIa squamous cell carcinoma of the right  lung.  PLAN:    1.  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  CT scan results from July 25, 2020 reviewed independently.  Patient underwent bronchoscopy on July 29, 2020 confirming the diagnosis.  PET scan results from August 15, 2020 reviewed independently confirming stage of disease.  CT of the head did not reveal any metastatic lesions.  Patient completed 5 weekly cycles of carboplatinum and Taxol on October 10, 2020 and XRT on October 20, 2020.  Patient will now receive 1 year of maintenance durvalumab.  Proceed with cycle 1 today.  Return to clinic in 2 weeks for further evaluation and consideration of cycle 2.  Will reimage in the next 1 to 2 months.    2.  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  Given her advanced age and small size of malignancy, Oncotype DX or adjuvant chemotherapy was not necessary.  She was also evaluated by radiation oncology who determined that adjuvant XRT was also not needed.  Continue letrozole for a total of 5 years completing treatment in August 2025.  Treatment was discontinued during chemotherapy, but now can be reinitiated in conjunction with her immunotherapy.   Her most recent mammogram on November 13, 2019 was reported as BI-RADS 2, repeat in July 2022.    2.  Osteoporosis: Patient's most recent bone mineral density on January 14, 2020 reported T score of -2.6 which is mildly improved over 1 year prior where  her T score was reported at -2.8.  She is tolerating letrozole well so we will continue this medication as prescribed.  Continue Fosamax, calcium, and vitamin D supplementation.  Repeat bone mineral density in September 2022.   3.  Anemia: Resolved. 4.  Hemoptysis: Resolved. 5.  Hypokalemia: Resolved.  Continue new oral potassium supplementation. 6.  Weight loss: Improved.  Continue dexamethasone as prescribed. 7.  Leukopenia/neutropenia: Resolved. 8.  Thrombocytopenia: Resolved.  Patient expressed understanding and was in agreement with this plan. She also understands that She can call clinic at any time with any questions, concerns, or complaints.   Cancer Staging Malignant neoplasm of upper-outer quadrant of right breast in female, estrogen receptor positive ([place]) Staging form: Breast, AJCC 8th Edition - Clinical stage from 11/06/2018: Stage IA (cT1a, cN0, cM0, G1, ER+, PR+, HER2-) - Signed by [name], MD on 11/06/2018 Histologic grading system: 3 grade system  Squamous cell carcinoma of right lung ([place]) Staging form: Lung, AJCC 8th Edition - Pathologic stage from 08/05/2020: Stage IIIA (pT3, pN1, cM0) - Signed by [name], MD on 08/05/2020 Stage prefix: Initial diagnosis   [name], MD   11/24/2020 6:46 AM

## 2020-11-23 ENCOUNTER — Other Ambulatory Visit: Payer: Self-pay

## 2020-11-23 ENCOUNTER — Ambulatory Visit: Payer: Medicare HMO | Admitting: Radiation Oncology

## 2020-11-23 ENCOUNTER — Inpatient Hospital Stay: Payer: Medicare HMO

## 2020-11-23 ENCOUNTER — Inpatient Hospital Stay (HOSPITAL_BASED_OUTPATIENT_CLINIC_OR_DEPARTMENT_OTHER): Payer: Medicare HMO | Admitting: Oncology

## 2020-11-23 ENCOUNTER — Encounter: Payer: Self-pay | Admitting: Oncology

## 2020-11-23 VITALS — BP 135/86 | HR 61 | Temp 98.3°F | Resp 18 | Wt 128.7 lb

## 2020-11-23 VITALS — BP 127/82 | HR 60 | Resp 16

## 2020-11-23 DIAGNOSIS — Z5112 Encounter for antineoplastic immunotherapy: Secondary | ICD-10-CM | POA: Diagnosis not present

## 2020-11-23 DIAGNOSIS — C3491 Malignant neoplasm of unspecified part of right bronchus or lung: Secondary | ICD-10-CM | POA: Diagnosis not present

## 2020-11-23 LAB — COMPREHENSIVE METABOLIC PANEL
ALT: 35 U/L (ref 0–44)
AST: 29 U/L (ref 15–41)
Albumin: 3.6 g/dL (ref 3.5–5.0)
Alkaline Phosphatase: 44 U/L (ref 38–126)
Anion gap: 9 (ref 5–15)
BUN: 27 mg/dL — ABNORMAL HIGH (ref 8–23)
CO2: 24 mmol/L (ref 22–32)
Calcium: 8.2 mg/dL — ABNORMAL LOW (ref 8.9–10.3)
Chloride: 100 mmol/L (ref 98–111)
Creatinine, Ser: 0.8 mg/dL (ref 0.44–1.00)
GFR, Estimated: >60 mL/min (ref >60)
Glucose, Bld: 145 mg/dL — ABNORMAL HIGH (ref 70–99)
Potassium: 4.2 mmol/L (ref 3.5–5.1)
Sodium: 133 mmol/L — ABNORMAL LOW (ref 135–145)
Total Bilirubin: 1 mg/dL (ref 0.3–1.2)
Total Protein: 6.8 g/dL (ref 6.5–8.1)

## 2020-11-23 LAB — CBC WITH DIFFERENTIAL/PLATELET
Abs Immature Granulocytes: 0.32 10*3/uL — ABNORMAL HIGH (ref 0.00–0.07)
Basophils Absolute: 0 10*3/uL (ref 0.0–0.1)
Basophils Relative: 0 %
Eosinophils Absolute: 0 10*3/uL (ref 0.0–0.5)
Eosinophils Relative: 0 %
HCT: 40.3 % (ref 36.0–46.0)
Hemoglobin: 12.8 g/dL (ref 12.0–15.0)
Immature Granulocytes: 4 %
Lymphocytes Relative: 13 %
Lymphs Abs: 0.9 10*3/uL (ref 0.7–4.0)
MCH: 29.8 pg (ref 26.0–34.0)
MCHC: 31.8 g/dL (ref 30.0–36.0)
MCV: 93.7 fL (ref 80.0–100.0)
Monocytes Absolute: 0.8 10*3/uL (ref 0.1–1.0)
Monocytes Relative: 11 %
Neutro Abs: 5.2 10*3/uL (ref 1.7–7.7)
Neutrophils Relative %: 72 %
Platelets: 160 10*3/uL (ref 150–400)
RBC: 4.3 MIL/uL (ref 3.87–5.11)
RDW: 23.7 % — ABNORMAL HIGH (ref 11.5–15.5)
WBC: 7.2 10*3/uL (ref 4.0–10.5)
nRBC: 1.7 % — ABNORMAL HIGH (ref 0.0–0.2)

## 2020-11-23 LAB — TSH: TSH: 0.464 u[IU]/mL (ref 0.350–4.500)

## 2020-11-23 MED ORDER — HEPARIN SOD (PORK) LOCK FLUSH 100 UNIT/ML IV SOLN
500.0000 [IU] | Freq: Once | INTRAVENOUS | Status: AC | PRN
  Administered 2020-11-23: 500 [IU]
  Filled 2020-11-23: qty 5

## 2020-11-23 MED ORDER — SODIUM CHLORIDE 0.9 % IV SOLN
620.0000 mg | Freq: Once | INTRAVENOUS | Status: AC
  Administered 2020-11-23: 620 mg via INTRAVENOUS
  Filled 2020-11-23: qty 10

## 2020-11-23 MED ORDER — SODIUM CHLORIDE 0.9 % IV SOLN
Freq: Once | INTRAVENOUS | Status: AC
  Filled 2020-11-23: qty 250

## 2020-11-23 MED ORDER — HEPARIN SOD (PORK) LOCK FLUSH 100 UNIT/ML IV SOLN
INTRAVENOUS | Status: AC
  Filled 2020-11-23: qty 5

## 2020-11-23 NOTE — Patient Instructions (Signed)
[facility]  Discharge Instructions: Thank you for choosing [facility] to provide your oncology and hematology care.  If you have a lab appointment with the [facility], please go directly to the [facility] and check in at the registration area.  Wear comfortable clothing and clothing appropriate for easy access to any Portacath or PICC line.   We strive to give you quality time with your provider. You may need to reschedule your appointment if you arrive late (15 or more minutes).  Arriving late affects you and other patients whose appointments are after yours.  Also, if you miss three or more appointments without notifying the office, you may be dismissed from the clinic at the provider's discretion.      For prescription refill requests, have your pharmacy contact our office and allow 72 hours for refills to be completed.    Today you received the following chemotherapy and/or immunotherapy agents Imfinzi      To help prevent nausea and vomiting after your treatment, we encourage you to take your nausea medication as directed.  BELOW ARE SYMPTOMS THAT SHOULD BE REPORTED IMMEDIATELY: *FEVER GREATER THAN 100.4 F (38 C) OR HIGHER *CHILLS OR SWEATING *NAUSEA AND VOMITING THAT IS NOT CONTROLLED WITH YOUR NAUSEA MEDICATION *UNUSUAL SHORTNESS OF BREATH *UNUSUAL BRUISING OR BLEEDING *URINARY PROBLEMS (pain or burning when urinating, or frequent urination) *BOWEL PROBLEMS (unusual diarrhea, constipation, pain near the anus) TENDERNESS IN MOUTH AND THROAT WITH OR WITHOUT PRESENCE OF ULCERS (sore throat, sores in mouth, or a toothache) UNUSUAL RASH, SWELLING OR PAIN  UNUSUAL VAGINAL DISCHARGE OR ITCHING   Items with * indicate a potential emergency and should be followed up as soon as possible or go to the Emergency Department if any problems should occur.  Please show the CHEMOTHERAPY ALERT CARD or IMMUNOTHERAPY ALERT CARD at check-in to  the Emergency Department and triage nurse.  Should you have questions after your visit or need to cancel or reschedule your appointment, please contact [facility]  [phone] and follow the prompts.  Office hours are 8:00 a.m. to 4:30 p.m. Monday - Friday. Please note that voicemails left after 4:00 p.m. may not be returned until the following business day.  We are closed weekends and major holidays. You have access to a nurse at all times for urgent questions. Please call the main number to the clinic [phone] and follow the prompts.  For any non-urgent questions, you may also contact your provider using MyChart. We now offer e-Visits for anyone 26 and older to request care online for non-urgent symptoms. For details visit [email or web address].   Also download the MyChart app! Go to the app store, search "MyChart", open the app, select [facility], and log in with your MyChart username and password.  Due to Covid, a mask is required upon entering the hospital/clinic. If you do not have a mask, one will be given to you upon arrival. For doctor visits, patients may have 1 support person aged 12 or older with them. For treatment visits, patients cannot have anyone with them due to current Covid guidelines and our immunocompromised population.

## 2020-11-23 NOTE — Progress Notes (Signed)
Nutrition Follow-up:   Patient with right lung cancer.  Patient receiving chemotherapy.  Noted hospitalization and rehab admission.    Met with patient during infusion.  Patient reports that she is now home and sister is living with her and helping her prepare meals.  Reports that her appetite is good.  Eating peanut butter nabs during visit.  Patient reports that she is drinking 2 ensure shakes per day (thinks plus).  Says that she ate peanut butter nabs for dinner last night and oatmeal for breakfast this am.  Denies nausea or constipation/diarrhea.      Medications: reviewed  Labs: reviewed  Anthropometrics:   Weight 128 lb 11.2 oz today  141 lb on 7/20  134 lb 7 oz on 6/7    NUTRITION DIAGNOSIS: Inadequate oral intake continues    INTERVENTION:  Encouraged patient to continue oral nutrition supplement Encouraged high calorie, high protein foods.  List of snack foods given to patient to eat.   If weight continues to decrease may need to consider increasing remeron     MONITORING, EVALUATION, GOAL: weight trends, intake   NEXT VISIT: Wednesday, August 3 during infusion  Ziomara Birenbaum [name] Resides, [place], [place] Registered Dietitian [phone] (mobile)

## 2020-11-23 NOTE — Progress Notes (Signed)
Patient has a 13 lbs wt loss despite having a good appetite.

## 2020-11-24 LAB — T4: T4, Total: 6.2 ug/dL (ref 4.5–12.0)

## 2020-11-26 ENCOUNTER — Other Ambulatory Visit: Payer: Self-pay | Admitting: Oncology

## 2020-11-27 ENCOUNTER — Other Ambulatory Visit: Payer: Self-pay | Admitting: Oncology

## 2020-11-29 ENCOUNTER — Other Ambulatory Visit: Payer: Self-pay

## 2020-11-29 ENCOUNTER — Inpatient Hospital Stay: Payer: Medicare HMO | Attending: Oncology | Admitting: Oncology

## 2020-11-29 ENCOUNTER — Telehealth: Payer: Self-pay

## 2020-11-29 DIAGNOSIS — Z7952 Long term (current) use of systemic steroids: Secondary | ICD-10-CM | POA: Insufficient documentation

## 2020-11-29 DIAGNOSIS — D696 Thrombocytopenia, unspecified: Secondary | ICD-10-CM | POA: Insufficient documentation

## 2020-11-29 DIAGNOSIS — Z8673 Personal history of transient ischemic attack (TIA), and cerebral infarction without residual deficits: Secondary | ICD-10-CM | POA: Insufficient documentation

## 2020-11-29 DIAGNOSIS — I129 Hypertensive chronic kidney disease with stage 1 through stage 4 chronic kidney disease, or unspecified chronic kidney disease: Secondary | ICD-10-CM | POA: Insufficient documentation

## 2020-11-29 DIAGNOSIS — Z950 Presence of cardiac pacemaker: Secondary | ICD-10-CM | POA: Insufficient documentation

## 2020-11-29 DIAGNOSIS — C50411 Malignant neoplasm of upper-outer quadrant of right female breast: Secondary | ICD-10-CM | POA: Insufficient documentation

## 2020-11-29 DIAGNOSIS — Z5112 Encounter for antineoplastic immunotherapy: Secondary | ICD-10-CM | POA: Insufficient documentation

## 2020-11-29 DIAGNOSIS — L270 Generalized skin eruption due to drugs and medicaments taken internally: Secondary | ICD-10-CM

## 2020-11-29 DIAGNOSIS — I480 Paroxysmal atrial fibrillation: Secondary | ICD-10-CM | POA: Insufficient documentation

## 2020-11-29 DIAGNOSIS — N183 Chronic kidney disease, stage 3 unspecified: Secondary | ICD-10-CM | POA: Insufficient documentation

## 2020-11-29 DIAGNOSIS — E785 Hyperlipidemia, unspecified: Secondary | ICD-10-CM | POA: Insufficient documentation

## 2020-11-29 DIAGNOSIS — Z170 Estrogen receptor positive status [ER+]: Secondary | ICD-10-CM | POA: Insufficient documentation

## 2020-11-29 DIAGNOSIS — Z79899 Other long term (current) drug therapy: Secondary | ICD-10-CM | POA: Insufficient documentation

## 2020-11-29 DIAGNOSIS — Z79811 Long term (current) use of aromatase inhibitors: Secondary | ICD-10-CM | POA: Insufficient documentation

## 2020-11-29 DIAGNOSIS — C3491 Malignant neoplasm of unspecified part of right bronchus or lung: Secondary | ICD-10-CM | POA: Insufficient documentation

## 2020-11-29 MED ORDER — TRIAMCINOLONE ACETONIDE 0.5 % EX OINT: 1.0000 "application " | TOPICAL_OINTMENT | Freq: Three times a day (TID) | CUTANEOUS | 0 refills | Status: DC

## 2020-11-29 MED ORDER — PREDNISONE 5 MG PO TABS: 10.0000 mg | ORAL_TABLET | Freq: Every day | ORAL | 0 refills | Status: DC

## 2020-11-29 NOTE — Telephone Encounter (Signed)
Pt's POA called and stated that pt has developed a rash over her body. Pt received Durvalumab on 7/27. The POA has not seen the rash, as the message came from another family member, so she did not have any further details to provide. Please advise.

## 2020-11-29 NOTE — Telephone Encounter (Signed)
Pt's POA, Vivien Rota, contacted and scheduled for MyChart visit at 2:45pm.

## 2020-11-29 NOTE — Progress Notes (Signed)
Virtual Visit via Video Note  I connected with [name] on 11/29/20 at  2:45 PM EDT by a video enabled telemedicine application and verified that I am speaking with the correct person using two identifiers.  Location: Patient: Home Provider: Clinic   I discussed the limitations of evaluation and management by telemedicine and the availability of in person appointments. The patient expressed understanding and agreed to proceed.  History of Present Illness: [name] is an 87 year old female with past medical history significant for hypertension, atrial fibrillation, non-STEMI, CKD, hyperlipidemia and squamous cell carcinoma of right lung who is being treated by Dr. Grayland Ormond and is on maintenance durvalumab (11/23/20).  She completed 5 cycles of carbo/Taxol on 10/10/2020 and daily radiation.  Patient's caregiver Nicole Kindred called clinic this morning stating that she had developed a rash all of her body.  Rash is nonpruritic and nonraised.  There is no open areas.  Appears to be mainly on torso on her back, chest and abdomen.  Scattered areas on her legs bilaterally.  Is not hot to touch or appear infected.  Rash appears worse today than yesterday.  They have not tried anything for the rash. Denies any new soaps or lotions, laundry detergent or any additional medications besides the Imfinzi.  Observations/Objective: Review of Systems  Constitutional: Negative.  Negative for chills, fever, malaise/fatigue and weight loss.  HENT:  Negative for congestion, ear pain and tinnitus.   Eyes: Negative.  Negative for blurred vision and double vision.  Respiratory: Negative.  Negative for cough, sputum production and shortness of breath.   Cardiovascular: Negative.  Negative for chest pain, palpitations and leg swelling.  Gastrointestinal: Negative.  Negative for abdominal pain, constipation, diarrhea, nausea and vomiting.  Genitourinary:  Negative for dysuria, frequency and urgency.   Musculoskeletal:  Negative for back pain and falls.  Skin:  Positive for rash.  Neurological: Negative.  Negative for weakness and headaches.  Endo/Heme/Allergies: Negative.  Does not bruise/bleed easily.  Psychiatric/Behavioral: Negative.  Negative for depression. The patient is not nervous/anxious and does not have insomnia.    Physical Exam Skin:    Findings: Rash present. Rash is macular.     Comments: Scattered on back, abdomen and chest. On lower extremities as well. Non itchy and non raised.   Neurological:     Mental Status: She is alert.   Assessment and Plan: Immunotherapy induced dermatitis- Rash developed approximately 5 days post her first Imfinzi infusion.  Patient has not been complaining of itching but it does appear to be worsening  per Vivien Rota.  Given large body surface area of her back, chest, abdomen and scattered on bilateral lower extremities recommend low-dose steroids for 5 days.  We will also give her topical steroid cream for spot treatment.  Typically, steroids are contraindicated with immunotherapy but are sometimes required in low doses (10 mg or less).  Follow Up Instructions: RTC if symptoms fail to improve.  She is scheduled for follow-up with Dr. Grayland Ormond on 12/07/2020.   I discussed the assessment and treatment plan with the patient. The patient was provided an opportunity to ask questions and all were answered. The patient agreed with the plan and demonstrated an understanding of the instructions.   The patient was advised to call back or seek an in-person evaluation if the symptoms worsen or if the condition fails to improve as anticipated.  I spent 20 minutes dedicated to the care of this patient (face-to-face and non-face-to-face) on the date of the encounter to include  what is described in the assessment and plan.   Jacquelin Hawking, NP

## 2020-11-30 ENCOUNTER — Inpatient Hospital Stay: Payer: Medicare HMO | Admitting: Oncology

## 2020-11-30 ENCOUNTER — Inpatient Hospital Stay: Payer: Medicare HMO

## 2020-11-30 ENCOUNTER — Other Ambulatory Visit: Payer: Self-pay | Admitting: Oncology

## 2020-11-30 ENCOUNTER — Telehealth: Payer: Self-pay

## 2020-11-30 DIAGNOSIS — C3491 Malignant neoplasm of unspecified part of right bronchus or lung: Secondary | ICD-10-CM

## 2020-11-30 NOTE — Progress Notes (Signed)
Can double Dexamethasone to 8mg  daily X 5 days. No prednisone.   Faythe Casa, NP 11/30/2020 2:39 PM

## 2020-11-30 NOTE — Telephone Encounter (Signed)
Pt's sister Lesia Hausen called and stated that pt was given new Rx for prednisone yesterday, but that pt is already taking a steroid, and the pharmacy would like clarification on how/what to take. Per Faythe Casa, NP, pt can double her dose of dexamethasone for 5 days instead of taking the prednisone. Vivien Rota was phoned with this information and verbalized understanding.

## 2020-12-02 NOTE — Progress Notes (Signed)
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  INTERVAL HISTORY: Patient returns to clinic today for further evaluation and consideration of cycle 2 of maintenance durvalumab.  She continues to have chronic weakness and fatigue, but her daughter has felt this is slightly improved she otherwise feels well.  She has no neurologic complaints.  She denies any recent fevers. She denies any chest pain, shortness of breath, hemoptysis, or cough.  She has a fair appetite, but denies weight loss.  She denies any nausea, vomiting, constipation, or diarrhea.  She has no urinary complaints.  Patient offers no further specific complaints today.  REVIEW OF SYSTEMS:   Review of Systems  Constitutional:  Positive for malaise/fatigue. Negative for fever and weight loss.  Respiratory: Negative.  Negative for cough, hemoptysis and shortness of breath.   Cardiovascular: Negative.  Negative for chest pain and leg swelling.  Gastrointestinal: Negative.  Negative for abdominal pain.  Genitourinary:  Negative for dysuria and frequency.  Musculoskeletal: Negative.  Negative for back pain.  Skin: Negative.  Negative for rash.  Neurological:  Positive for weakness. Negative for dizziness, focal weakness and headaches.  Psychiatric/Behavioral: Negative.  The patient is not nervous/anxious.    As per HPI. Otherwise, a complete review of systems is negative.  PAST MEDICAL HISTORY: Past Medical  History:  Diagnosis Date   Breast cancer, right ([place]) 11/2018   Mastectomy   C. difficile colitis    CKD (chronic kidney disease), stage III ([place])    Complete heart block ([place])    a. Originally placed 08/2004; b. 07/2013 s/p MDT [place] PPM (ser # [id number]).   Dyspnea    Gait abnormality 07/23/2016   GERD (gastroesophageal reflux disease)    Gout    Hemoptysis 07/25/2020   History of hypertension    History of stroke    Hyperhomocysteinemia (HCC)    Hyperlipidemia    Hypertension    Hypokalemia 07/13/2018   Low blood magnesium 07/13/2018   Lung cancer ([place])    Pacemaker-Medtronic 08/07/2011   PAF (paroxysmal atrial fibrillation) ([place])    a. 07/2020 Device interrogation-->13.3% AF burden-->longest atrial high rate episode 24:26 mins-->AT vs AF. Not on [place].   Pulmonary hypertension ([place])    a. 06/2018 Echo: EF 60-65%, no rwma, nl RV fxn, RVSP 48.32mHg. Mild AoV Ca2+.   Rhabdomyolysis    Stroke ([facility]    left sided weakness, 07/28/20- no residual    Vomiting 07/13/2018    PAST SURGICAL HISTORY: Past Surgical History:  Procedure Laterality Date   BIOPSY  07/15/2018   Procedure: BIOPSY;  Surgeon: GGatha Mayer MD;  Location: [facility] ENDOSCOPY;  Service: Gastroenterology;;   BREAST BIOPSY Right 10/27/2018   affirm bx of mass, x clip,  INVASIVE MAMMARY CARCINOMA   BREAST LUMPECTOMY Right 12/03/2018   BRONCHIAL BIOPSY  07/29/2020   Procedure: BRONCHIAL BIOPSIES;  Surgeon: OLaurin Coder MD;  [facility];  Service: Pulmonary;;   BRONCHIAL NEEDLE ASPIRATION BIOPSY  07/29/2020   Procedure: BRONCHIAL NEEDLE  ASPIRATION BIOPSIES;  Surgeon: Laurin Coder, MD;  Location: [place] ENDOSCOPY;  Service: Pulmonary;;   CARDIAC CATHETERIZATION     ENDOBRONCHIAL ULTRASOUND N/A 07/29/2020   Procedure: ENDOBRONCHIAL ULTRASOUND;  Surgeon: Laurin Coder, MD;  Location: [facility] ENDOSCOPY;  Service: Pulmonary;  Laterality: N/A;   ESOPHAGOGASTRODUODENOSCOPY Left 07/15/2018   Procedure:  ESOPHAGOGASTRODUODENOSCOPY (EGD);  Surgeon: [name], MD;  Location: [facility];  Service: Gastroenterology;  Laterality: Left;   HEMOSTASIS CONTROL  07/29/2020   Procedure: HEMOSTASIS CONTROL;  Surgeon: Laurin Coder, MD;  Location: [place] [facility] [facility] ENDOSCOPY;  Service: Pulmonary;;   PACEMAKER INSERTION     Medtronic Enpulse dual-chamber pacemaker   PARTIAL MASTECTOMY WITH NEEDLE LOCALIZATION AND AXILLARY SENTINEL LYMPH NODE BX Right 12/03/2018   Procedure: PARTIAL MASTECTOMY WITH NEEDLE LOCALIZATION AND AXILLARY SENTINEL LYMPH NODE BX, RIGHT;  Surgeon: [name], MD;  Location: [facility] ORS;  Service: General;  Laterality: Right;   PERMANENT PACEMAKER GENERATOR CHANGE N/A 07/30/2013   Procedure: PERMANENT PACEMAKER GENERATOR CHANGE;  Surgeon: [name], MD;  Location: [facility] CATH LAB;  Service: Cardiovascular;  Laterality: N/A;   PORTA CATH INSERTION N/A 08/16/2020   Procedure: PORTA CATH INSERTION;  Surgeon: Katha Cabal, MD;  Location: [facility] CV LAB;  Service: Cardiovascular;  Laterality: N/A;   VIDEO BRONCHOSCOPY N/A 07/29/2020   Procedure: VIDEO BRONCHOSCOPY WITH FLUORO;  Surgeon: Laurin Coder, MD;  Location: [place] ENDOSCOPY;  Service: Pulmonary;  Laterality: N/A;    FAMILY HISTORY: Family History  Problem Relation Age of Onset   Hypertension Mother    Breast cancer Sister    Breast cancer Sister    Colon cancer Neg Hx    Esophageal cancer Neg Hx     ADVANCED DIRECTIVES (Y/N):  N  HEALTH MAINTENANCE: Social History   Tobacco Use   Smoking status: Former    Types: Cigarettes    Quit date: 07/25/2006    Years since quitting: 14.3   Smokeless tobacco: Never  Vaping Use   Vaping Use: Never used  Substance Use Topics   Alcohol use: No   Drug use: No     Colonoscopy:  PAP:  Bone density:  Lipid panel:  No Known Allergies  Current Outpatient Medications  Medication Sig Dispense Refill   acetaminophen (TYLENOL) 500 MG tablet Take 500-1,000 mg by mouth  every 6 (six) hours as needed for moderate pain.      alendronate (FOSAMAX) 70 MG tablet TAKE 1 TABLET (70 MG TOTAL) BY MOUTH ONCE A WEEK. TAKE WITH A FULL GLASS OF WATER ON AN EMPTY STOMACH. 12 tablet 2   allopurinol (ZYLOPRIM) 100 MG tablet Take 100 mg by mouth daily.     atorvastatin (LIPITOR) 20 MG tablet Take 1 tablet (20 mg total) by mouth daily. 30 tablet 0   calcium-vitamin D 250-100 MG-UNIT tablet Take 2 tablets by mouth daily.     clopidogrel (PLAVIX) 75 MG tablet Take 75 mg by mouth daily.     dexamethasone (DECADRON) 4 MG tablet Take 1 tablet (4 mg total) by mouth daily. 30 tablet 1   diltiazem (CARDIZEM CD) 120 MG 24 hr capsule Take 1 capsule (120 mg total) by mouth daily. 30 capsule 0   docusate sodium (COLACE) 100 MG capsule Take 100 mg by mouth 2 (two) times daily as needed for mild constipation.     donepezil (ARICEPT) 5 MG tablet Take 5 mg by mouth daily.     feeding supplement (ENSURE ENLIVE / ENSURE PLUS) LIQD Take 237 mLs  by mouth 3 (three) times daily between meals. 10000 mL 0   letrozole (FEMARA) 2.5 MG tablet TAKE 1 TABLET BY MOUTH EVERY DAY 90 tablet 3   LUMIGAN 0.01 % SOLN Place 1 drop into both eyes at bedtime.  4   mirtazapine (REMERON) 15 MG tablet Take 15 mg by mouth at bedtime.     Blood Pressure KIT Automated blood pressure measuring device. (Patient not taking: Reported on 12/07/2020) 1 each 0   dextromethorphan-guaiFENesin (MUCINEX DM) 30-600 MG 12hr tablet Take 1 tablet by mouth 2 (two) times daily. (Patient not taking: Reported on 12/07/2020) 30 tablet 0   magnesium hydroxide (MILK OF MAGNESIA) 400 MG/5ML suspension Take 30 mLs by mouth once. for constipation. (Patient not taking: Reported on 12/07/2020)     Magnesium Oxide 250 MG TABS Take 250 mg by mouth daily. (Patient not taking: Reported on 12/07/2020)     Multiple Vitamin (MULTIVITAMIN WITH MINERALS) TABS tablet Take 1 tablet by mouth daily. (Patient not taking: Reported on 12/07/2020) 30 tablet 0    triamcinolone ointment (KENALOG) 0.5 % Apply 1 application topically 3 (three) times daily. Apply to rash on back prn (Patient not taking: Reported on 12/07/2020) 30 g 0   No current facility-administered medications for this visit.   Facility-Administered Medications Ordered in Other Visits  Medication Dose Route Frequency Provider Last Rate Last Admin   heparin lock flush 100 unit/mL  500 Units Intravenous Once [name], MD       sodium chloride flush (NS) 0.9 % injection 10 mL  10 mL Intravenous PRN [name], MD   10 mL at 09/13/20 0904    OBJECTIVE: Vitals:   12/07/20 0936 12/07/20 0940  BP: 129/85   Pulse: 60   Resp:  18  Temp: 98.2 F (36.8 C)      Body mass index is 23.96 kg/m.    ECOG FS:2 - Symptomatic, <50% confined to bed  General: Well-developed, well-nourished, no acute distress.  Sitting in a wheelchair. Eyes: Pink conjunctiva, anicteric sclera. HEENT: Normocephalic, moist mucous membranes. Lungs: No audible wheezing or coughing. Heart: Regular rate and rhythm. Abdomen: Soft, nontender, no obvious distention. Musculoskeletal: No edema, cyanosis, or clubbing. Neuro: Alert, answering all questions appropriately. Cranial nerves grossly intact. Skin: No rashes or petechiae noted. Psych: Normal affect.   LAB RESULTS:  Lab Results  Component Value Date   NA 134 (L) 12/07/2020   K 3.6 12/07/2020   CL 99 12/07/2020   CO2 23 12/07/2020   GLUCOSE 158 (H) 12/07/2020   BUN 33 (H) 12/07/2020   CREATININE 0.67 12/07/2020   CALCIUM 9.0 12/07/2020   PROT 6.5 12/07/2020   ALBUMIN 3.4 (L) 12/07/2020   AST 29 12/07/2020   ALT 42 12/07/2020   ALKPHOS 51 12/07/2020   BILITOT 1.2 12/07/2020   GFRNONAA >60 12/07/2020   GFRAA >60 07/24/2018    Lab Results  Component Value Date   WBC 9.7 12/07/2020   NEUTROABS 7.9 (H) 12/07/2020   HGB 13.7 12/07/2020   HCT 42.2 12/07/2020   MCV 94.8 12/07/2020   PLT 101 (L) 12/07/2020     STUDIES: No  results found.  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  PLAN:    1.  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  CT scan results from July 25, 2020 reviewed independently.  Patient underwent bronchoscopy on July 29, 2020 confirming the diagnosis.  PET scan results from August 15, 2020 reviewed independently confirming stage of disease.  CT of the head did not reveal any metastatic lesions.  Patient completed 5 weekly cycles of carboplatinum and Taxol on October 10, 2020 and XRT on October 20, 2020.  Patient will now receive 1 year of maintenance durvalumab every 2 weeks which was initiated on November 23, 2020.  Proceed with cycle 2 of treatment today.  Return to clinic in 2 weeks for further evaluation and consideration of cycle 3.  Will reimage in September or October 2022.  Patient was also given a referral to palliative care.  2.  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  Given her advanced age and small size of malignancy, Oncotype DX or adjuvant chemotherapy was not necessary.  She was also evaluated by radiation oncology who determined that adjuvant XRT was also not needed.  Continue letrozole for a total of 5 years completing treatment in August 2025.  Treatment was discontinued during chemotherapy, but now can be reinitiated in conjunction with her immunotherapy.   Her most recent mammogram on November 13, 2019 was reported as BI-RADS 2, repeat in July 2022.    2.  Osteoporosis: Patient's most recent bone mineral density on January 14, 2020 reported T score of -2.6 which is mildly improved over 1 year prior where her T score was reported at -2.8.  She is tolerating letrozole well so we will continue this medication as  prescribed.  Continue Fosamax, calcium, and vitamin D supplementation.  Repeat bone mineral density in September 2022.   3.  Weight loss: Improved.  Continue dexamethasone as prescribed. 4.  Thrombocytopenia: Mild, monitor.  Proceed with treatment as above  Patient expressed understanding and was in agreement with this plan. She also understands that She can call clinic at any time with any questions, concerns, or complaints.   Cancer Staging Malignant neoplasm of upper-outer quadrant of right breast in female, estrogen receptor positive ([place]) Staging form: Breast, AJCC 8th Edition - Clinical stage from 11/06/2018: Stage IA (cT1a, cN0, cM0, G1, ER+, PR+, HER2-) - Signed by [name], MD on 11/06/2018 Histologic grading system: 3 grade system  Squamous cell carcinoma of right lung ([place]) Staging form: Lung, AJCC 8th Edition - Pathologic stage from 08/05/2020: Stage IIIA (pT3, pN1, cM0) - Signed by [name], MD on 08/05/2020 Stage prefix: Initial diagnosis   [name], MD   12/08/2020 6:29 AM

## 2020-12-07 ENCOUNTER — Other Ambulatory Visit: Payer: Self-pay

## 2020-12-07 ENCOUNTER — Inpatient Hospital Stay: Payer: Medicare HMO

## 2020-12-07 ENCOUNTER — Inpatient Hospital Stay (HOSPITAL_BASED_OUTPATIENT_CLINIC_OR_DEPARTMENT_OTHER): Payer: Medicare HMO | Admitting: Oncology

## 2020-12-07 VITALS — BP 129/85 | HR 60 | Temp 98.2°F | Resp 18 | Wt 131.0 lb

## 2020-12-07 DIAGNOSIS — Z950 Presence of cardiac pacemaker: Secondary | ICD-10-CM | POA: Diagnosis not present

## 2020-12-07 DIAGNOSIS — C3491 Malignant neoplasm of unspecified part of right bronchus or lung: Secondary | ICD-10-CM

## 2020-12-07 DIAGNOSIS — D696 Thrombocytopenia, unspecified: Secondary | ICD-10-CM | POA: Diagnosis not present

## 2020-12-07 DIAGNOSIS — Z7952 Long term (current) use of systemic steroids: Secondary | ICD-10-CM | POA: Diagnosis not present

## 2020-12-07 DIAGNOSIS — N183 Chronic kidney disease, stage 3 unspecified: Secondary | ICD-10-CM | POA: Diagnosis not present

## 2020-12-07 DIAGNOSIS — C50411 Malignant neoplasm of upper-outer quadrant of right female breast: Secondary | ICD-10-CM | POA: Diagnosis present

## 2020-12-07 DIAGNOSIS — Z5112 Encounter for antineoplastic immunotherapy: Secondary | ICD-10-CM | POA: Diagnosis present

## 2020-12-07 DIAGNOSIS — E785 Hyperlipidemia, unspecified: Secondary | ICD-10-CM | POA: Diagnosis not present

## 2020-12-07 DIAGNOSIS — I129 Hypertensive chronic kidney disease with stage 1 through stage 4 chronic kidney disease, or unspecified chronic kidney disease: Secondary | ICD-10-CM | POA: Diagnosis not present

## 2020-12-07 DIAGNOSIS — Z79899 Other long term (current) drug therapy: Secondary | ICD-10-CM | POA: Diagnosis not present

## 2020-12-07 DIAGNOSIS — Z79811 Long term (current) use of aromatase inhibitors: Secondary | ICD-10-CM | POA: Diagnosis not present

## 2020-12-07 DIAGNOSIS — I480 Paroxysmal atrial fibrillation: Secondary | ICD-10-CM | POA: Diagnosis not present

## 2020-12-07 DIAGNOSIS — Z170 Estrogen receptor positive status [ER+]: Secondary | ICD-10-CM | POA: Diagnosis not present

## 2020-12-07 DIAGNOSIS — Z8673 Personal history of transient ischemic attack (TIA), and cerebral infarction without residual deficits: Secondary | ICD-10-CM | POA: Diagnosis not present

## 2020-12-07 LAB — COMPREHENSIVE METABOLIC PANEL
ALT: 42 U/L (ref 0–44)
AST: 29 U/L (ref 15–41)
Albumin: 3.4 g/dL — ABNORMAL LOW (ref 3.5–5.0)
Alkaline Phosphatase: 51 U/L (ref 38–126)
Anion gap: 12 (ref 5–15)
BUN: 33 mg/dL — ABNORMAL HIGH (ref 8–23)
CO2: 23 mmol/L (ref 22–32)
Calcium: 9 mg/dL (ref 8.9–10.3)
Chloride: 99 mmol/L (ref 98–111)
Creatinine, Ser: 0.67 mg/dL (ref 0.44–1.00)
GFR, Estimated: >60 mL/min (ref >60)
Glucose, Bld: 158 mg/dL — ABNORMAL HIGH (ref 70–99)
Potassium: 3.6 mmol/L (ref 3.5–5.1)
Sodium: 134 mmol/L — ABNORMAL LOW (ref 135–145)
Total Bilirubin: 1.2 mg/dL (ref 0.3–1.2)
Total Protein: 6.5 g/dL (ref 6.5–8.1)

## 2020-12-07 LAB — CBC WITH DIFFERENTIAL/PLATELET
Abs Immature Granulocytes: 0.37 10*3/uL — ABNORMAL HIGH (ref 0.00–0.07)
Basophils Absolute: 0 10*3/uL (ref 0.0–0.1)
Basophils Relative: 0 %
Eosinophils Absolute: 0 10*3/uL (ref 0.0–0.5)
Eosinophils Relative: 0 %
HCT: 42.2 % (ref 36.0–46.0)
Hemoglobin: 13.7 g/dL (ref 12.0–15.0)
Immature Granulocytes: 4 %
Lymphocytes Relative: 8 %
Lymphs Abs: 0.8 10*3/uL (ref 0.7–4.0)
MCH: 30.8 pg (ref 26.0–34.0)
MCHC: 32.5 g/dL (ref 30.0–36.0)
MCV: 94.8 fL (ref 80.0–100.0)
Monocytes Absolute: 0.6 10*3/uL (ref 0.1–1.0)
Monocytes Relative: 6 %
Neutro Abs: 7.9 10*3/uL — ABNORMAL HIGH (ref 1.7–7.7)
Neutrophils Relative %: 82 %
Platelets: 101 10*3/uL — ABNORMAL LOW (ref 150–400)
RBC: 4.45 MIL/uL (ref 3.87–5.11)
RDW: 20.7 % — ABNORMAL HIGH (ref 11.5–15.5)
WBC: 9.7 10*3/uL (ref 4.0–10.5)
nRBC: 1.6 % — ABNORMAL HIGH (ref 0.0–0.2)

## 2020-12-07 LAB — TSH: TSH: 0.377 u[IU]/mL (ref 0.350–4.500)

## 2020-12-07 MED ORDER — SODIUM CHLORIDE 0.9% FLUSH
10.0000 mL | Freq: Once | INTRAVENOUS | Status: AC
  Administered 2020-12-07: 10 mL via INTRAVENOUS
  Filled 2020-12-07: qty 10

## 2020-12-07 MED ORDER — HEPARIN SOD (PORK) LOCK FLUSH 100 UNIT/ML IV SOLN
500.0000 [IU] | Freq: Once | INTRAVENOUS | Status: AC | PRN
  Administered 2020-12-07: 500 [IU]
  Filled 2020-12-07: qty 5

## 2020-12-07 MED ORDER — DURVALUMAB 500 MG/10ML IV SOLN
620.0000 mg | Freq: Once | INTRAVENOUS | Status: AC
  Administered 2020-12-07: 620 mg via INTRAVENOUS
  Filled 2020-12-07: qty 10

## 2020-12-07 MED ORDER — SODIUM CHLORIDE 0.9 % IV SOLN
Freq: Once | INTRAVENOUS | Status: AC
  Filled 2020-12-07: qty 250

## 2020-12-07 NOTE — Progress Notes (Signed)
Patients daughter reports seeing some confusion in patient at times

## 2020-12-07 NOTE — Progress Notes (Signed)
Nutrition Follow-up:   Patient with right lung cancer.  Patient receiving chemotherapy.    Met with patient during infusion.  Patient says that her appetite is good.  Does not hear well.  Says that she ate eggs and bacon this am and coffee. When asked if had toast or grits she said yes and when asked which one she said bacon.  Somewhat more confused today.  Says that she is drinking 2-3 ensure per day.    Medications: remeron 15mg today  Labs: reviewed  Anthropometrics:   Weight 131 lb increased  128 lb 11.2 oz on 7/27 141 lb on 7/20 134 lb 7 oz on 6/7   NUTRITION DIAGNOSIS: Inadequate oral intake continues   INTERVENTION:  Continue ensure plus 2-3 times per day Continue high calorie, high protein foods     MONITORING, EVALUATION, GOAL: weight trends, intake   NEXT VISIT: ~ 4 weeks with treatment   B. , RD, LDN Registered Dietitian [phone] (mobile)   

## 2020-12-07 NOTE — Patient Instructions (Signed)
[facility]  Discharge Instructions: Thank you for choosing [facility] to provide your oncology and hematology care.  If you have a lab appointment with the [facility], please go directly to the [facility] and check in at the registration area.  Wear comfortable clothing and clothing appropriate for easy access to any Portacath or PICC line.   We strive to give you quality time with your provider. You may need to reschedule your appointment if you arrive late (15 or more minutes).  Arriving late affects you and other patients whose appointments are after yours.  Also, if you miss three or more appointments without notifying the office, you may be dismissed from the clinic at the provider's discretion.      For prescription refill requests, have your pharmacy contact our office and allow 72 hours for refills to be completed.    Today you received the following chemotherapy and/or immunotherapy agents: Durvalumab      To help prevent nausea and vomiting after your treatment, we encourage you to take your nausea medication as directed.  BELOW ARE SYMPTOMS THAT SHOULD BE REPORTED IMMEDIATELY: *FEVER GREATER THAN 100.4 F (38 C) OR HIGHER *CHILLS OR SWEATING *NAUSEA AND VOMITING THAT IS NOT CONTROLLED WITH YOUR NAUSEA MEDICATION *UNUSUAL SHORTNESS OF BREATH *UNUSUAL BRUISING OR BLEEDING *URINARY PROBLEMS (pain or burning when urinating, or frequent urination) *BOWEL PROBLEMS (unusual diarrhea, constipation, pain near the anus) TENDERNESS IN MOUTH AND THROAT WITH OR WITHOUT PRESENCE OF ULCERS (sore throat, sores in mouth, or a toothache) UNUSUAL RASH, SWELLING OR PAIN  UNUSUAL VAGINAL DISCHARGE OR ITCHING   Items with * indicate a potential emergency and should be followed up as soon as possible or go to the Emergency Department if any problems should occur.  Please show the CHEMOTHERAPY ALERT CARD or IMMUNOTHERAPY ALERT CARD at check-in  to the Emergency Department and triage nurse.  Should you have questions after your visit or need to cancel or reschedule your appointment, please contact [facility]  [phone] and follow the prompts.  Office hours are 8:00 a.m. to 4:30 p.m. Monday - Friday. Please note that voicemails left after 4:00 p.m. may not be returned until the following business day.  We are closed weekends and major holidays. You have access to a nurse at all times for urgent questions. Please call the main number to the clinic [phone] and follow the prompts.  For any non-urgent questions, you may also contact your provider using MyChart. We now offer e-Visits for anyone 85 and older to request care online for non-urgent symptoms. For details visit [email or web address].   Also download the MyChart app! Go to the app store, search "MyChart", open the app, select [facility], and log in with your MyChart username and password.  Due to Covid, a mask is required upon entering the hospital/clinic. If you do not have a mask, one will be given to you upon arrival. For doctor visits, patients may have 1 support person aged 26 or older with them. For treatment visits, patients cannot have anyone with them due to current Covid guidelines and our immunocompromised population. Durvalumab injection What is this medication? DURVALUMAB (dur VAL ue mab) is a monoclonal antibody. It is used to treat lungcancer. This medicine may be used for other purposes; ask your health care provider orpharmacist if you have questions. COMMON BRAND NAME(S): [email or web address] What should I tell my care team before I take this medication? They need  to know if you have any of these conditions: autoimmune diseases like Crohn's disease, ulcerative colitis, or lupus have had or planning to have an allogeneic stem cell transplant (uses someone else's stem cells) history of organ transplant history of radiation to the  chest nervous system problems like myasthenia gravis or Guillain-Barre syndrome an unusual or allergic reaction to durvalumab, other medicines, foods, dyes, or preservatives pregnant or trying to get pregnant breast-feeding How should I use this medication? This medicine is for infusion into a vein. It is given by a health careprofessional in a hospital or clinic setting. A special MedGuide will be given to you before each treatment. Be sure to readthis information carefully each time. Talk to your pediatrician regarding the use of this medicine in children.Special care may be needed. Overdosage: If you think you have taken too much of this medicine contact apoison control center or emergency room at once. NOTE: This medicine is only for you. Do not share this medicine with others. What if I miss a dose? It is important not to miss your dose. Call your doctor or health careprofessional if you are unable to keep an appointment. What may interact with this medication? Interactions have not been studied. This list may not describe all possible interactions. Give your health care provider a list of all the medicines, herbs, non-prescription drugs, or dietary supplements you use. Also tell them if you smoke, drink alcohol, or use illegaldrugs. Some items may interact with your medicine. What should I watch for while using this medication? This drug may make you feel generally unwell. Continue your course of treatmenteven though you feel ill unless your doctor tells you to stop. You may need blood work done while you are taking this medicine. Do not become pregnant while taking this medicine or for 3 months after stopping it. Women should inform their doctor if they wish to become pregnant or think they might be pregnant. There is a potential for serious side effects to an unborn child. Talk to your health care professional or pharmacist for more information. Do not breast-feed an infant while taking  this medicine orfor 3 months after stopping it. What side effects may I notice from receiving this medication? Side effects that you should report to your doctor or health care professionalas soon as possible: allergic reactions like skin rash, itching or hives, swelling of the face, lips, or tongue black, tarry stools bloody or watery diarrhea breathing problems change in emotions or moods change in sex drive changes in vision chest pain or chest tightness chills confusion cough facial flushing fever headache signs and symptoms of high blood sugar such as dizziness; dry mouth; dry skin; fruity breath; nausea; stomach pain; increased hunger or thirst; increased urination signs and symptoms of liver injury like dark yellow or brown urine; general ill feeling or flu-like symptoms; light-colored stools; loss of appetite; nausea; right upper belly pain; unusually weak or tired; yellowing of the eyes or skin stomach pain trouble passing urine or change in the amount of urine weight gain or weight loss Side effects that usually do not require medical attention (report these toyour doctor or health care professional if they continue or are bothersome): bone pain constipation loss of appetite muscle pain nausea swelling of the ankles, feet, hands tiredness This list may not describe all possible side effects. Call your doctor for medical advice about side effects. You may report side effects to FDA at1-800-FDA-1088. Where should I keep my medication? This drug is given  in a hospital or clinic and will not be stored at home. NOTE: This sheet is a summary. It may not cover all possible information. If you have questions about this medicine, talk to your doctor, pharmacist, orhealth care provider.  2022 Elsevier/Gold Standard (2019-06-25 13:01:29)

## 2020-12-08 ENCOUNTER — Other Ambulatory Visit: Payer: Self-pay | Admitting: Oncology

## 2020-12-08 LAB — T4: T4, Total: 6.4 ug/dL (ref 4.5–12.0)

## 2020-12-13 ENCOUNTER — Other Ambulatory Visit: Payer: Self-pay | Admitting: Oncology

## 2020-12-13 ENCOUNTER — Ambulatory Visit: Payer: Medicare HMO | Admitting: Radiation Oncology

## 2020-12-13 ENCOUNTER — Ambulatory Visit
Admission: RE | Admit: 2020-12-13 | Discharge: 2020-12-13 | Disposition: A | Discharge status: Home / Self Care | Payer: Medicare HMO | Source: Ambulatory Visit | Attending: Radiation Oncology | Admitting: Radiation Oncology

## 2020-12-13 ENCOUNTER — Encounter: Payer: Self-pay | Admitting: Radiation Oncology

## 2020-12-13 ENCOUNTER — Other Ambulatory Visit: Payer: Self-pay

## 2020-12-13 DIAGNOSIS — C3411 Malignant neoplasm of upper lobe, right bronchus or lung: Secondary | ICD-10-CM

## 2020-12-13 NOTE — Progress Notes (Signed)
Radiation Oncology Follow up Note  Name: [name]   Date:   12/13/2020 MRN:  [id number] DOB: 14-Sep-1933   Radiation Oncology TeleHEALTH VISIT PROGRESS NOTE  I connected with     [name]   by telephone-[facility]ex and verified that I am speaking with the correct person using two identifiers.  I discussed the limitations, risks, security and privacy concerns of performing an evaluation and management service by telemedicine and the availability of in-person appointments. I also discussed with the patient that there may be a patient responsible charge related to this service. The patient expressed understanding and agreed to proceed.    Other persons participating in the visit and their role in the encounter:    Patient's location: Home   Provider's location: work This 87 y.o. female presents by phone with 1 month follow-up status post concurrent chemo radiation for stage IIIa (T3 N1 M0) squamous cell carcinoma the right lung REFERRING PROVIDER: Philmore Pali, NP  HPI: Patient is a 87 year old female now at 1 month having completed concurrent chemoradiation therapy for stage IIIa squamous cell carcinoma the right lung.  I established voice communication by phone with the patient's daughter.  She seems to be doing well although she is quite frail at this time.  She is being started on immunotherapy by medical oncology specifically specifically denies cough hemoptysis or chest tightness.  She is also been established with palliative care although the daughter tells me she is having the patient involved with pace..  COMPLICATIONS OF TREATMENT: none  FOLLOW UP COMPLIANCE: keeps appointments   PHYSICAL EXAM:  There were no vitals taken for this visit. No physical exam was performed today  RADIOLOGY RESULTS: No current films for review  PLAN: Present time medical oncology to take over primary care of this patient since she is will be receiving immunotherapy.  I have asked to see her  back in 4 to 5 months for follow-up and we will set up that appointment.  Patient and family know to call anytime with any concerns.  I would like to take this opportunity to thank you for allowing me to participate in the care of your patient.Noreene Filbert, MD

## 2020-12-15 ENCOUNTER — Encounter: Payer: Medicare HMO | Admitting: Internal Medicine

## 2020-12-15 DIAGNOSIS — Z950 Presence of cardiac pacemaker: Secondary | ICD-10-CM

## 2020-12-15 DIAGNOSIS — I471 Supraventricular tachycardia: Secondary | ICD-10-CM

## 2020-12-15 DIAGNOSIS — I442 Atrioventricular block, complete: Secondary | ICD-10-CM

## 2020-12-21 ENCOUNTER — Inpatient Hospital Stay: Payer: Medicare HMO | Admitting: Hospice and Palliative Medicine

## 2020-12-21 ENCOUNTER — Inpatient Hospital Stay: Payer: Medicare HMO

## 2020-12-21 ENCOUNTER — Inpatient Hospital Stay: Payer: Medicare HMO | Admitting: Oncology

## 2020-12-22 ENCOUNTER — Other Ambulatory Visit: Payer: Self-pay | Admitting: Oncology

## 2020-12-22 NOTE — Progress Notes (Deleted)
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  INTERVAL HISTORY: Patient returns to clinic today for further evaluation and consideration of cycle 2 of maintenance durvalumab.  She continues to have chronic weakness and fatigue, but her daughter has felt this is slightly improved she otherwise feels well.  She has no neurologic complaints.  She denies any recent fevers. She denies any chest pain, shortness of breath, hemoptysis, or cough.  She has a fair appetite, but denies weight loss.  She denies any nausea, vomiting, constipation, or diarrhea.  She has no urinary complaints.  Patient offers no further specific complaints today.  REVIEW OF SYSTEMS:   Review of Systems  Constitutional:  Positive for malaise/fatigue. Negative for fever and weight loss.  Respiratory: Negative.  Negative for cough, hemoptysis and shortness of breath.   Cardiovascular: Negative.  Negative for chest pain and leg swelling.  Gastrointestinal: Negative.  Negative for abdominal pain.  Genitourinary:  Negative for dysuria and frequency.  Musculoskeletal: Negative.  Negative for back pain.  Skin: Negative.  Negative for rash.  Neurological:  Positive for weakness. Negative for dizziness, focal weakness and headaches.  Psychiatric/Behavioral: Negative.  The patient is not nervous/anxious.    As per HPI. Otherwise, a complete review of systems is negative.  PAST MEDICAL HISTORY: Past Medical  History:  Diagnosis Date   Breast cancer, right ([place]) 11/2018   Mastectomy   C. difficile colitis    CKD (chronic kidney disease), stage III ([place])    Complete heart block ([place])    a. Originally placed 08/2004; b. 07/2013 s/p MDT [place] PPM (ser # [id number]).   Dyspnea    Gait abnormality 07/23/2016   GERD (gastroesophageal reflux disease)    Gout    Hemoptysis 07/25/2020   History of hypertension    History of stroke    Hyperhomocysteinemia ([facility])    Hyperlipidemia    Hypertension    Hypokalemia 07/13/2018   Low blood magnesium 07/13/2018   Lung cancer ([place])    Pacemaker-Medtronic 08/07/2011   PAF (paroxysmal atrial fibrillation) ([place])    a. 07/2020 Device interrogation-->13.3% AF burden-->longest atrial high rate episode 24:26 mins-->AT vs AF. Not on [place].   Pulmonary hypertension ([place])    a. 06/2018 Echo: EF 60-65%, no rwma, nl RV fxn, RVSP 48.45mHg. Mild AoV Ca2+.   Rhabdomyolysis    Stroke ([facility]    left sided weakness, 07/28/20- no residual    Vomiting 07/13/2018    PAST SURGICAL HISTORY: Past Surgical History:  Procedure Laterality Date   BIOPSY  07/15/2018   Procedure: BIOPSY;  Surgeon: GGatha Mayer MD;  Location: [facility];  Service: Gastroenterology;;   BREAST BIOPSY Right 10/27/2018   affirm bx of mass, x clip,  INVASIVE MAMMARY CARCINOMA   BREAST LUMPECTOMY Right 12/03/2018   BRONCHIAL BIOPSY  07/29/2020   Procedure: BRONCHIAL BIOPSIES;  Surgeon: OLaurin Coder MD;  [facility];  Service: Pulmonary;;   BRONCHIAL NEEDLE ASPIRATION BIOPSY  07/29/2020   Procedure: BRONCHIAL NEEDLE  ASPIRATION BIOPSIES;  Surgeon: Olalere, Adewale A, MD;  Location: [facility];  Service: Pulmonary;;   CARDIAC CATHETERIZATION     ENDOBRONCHIAL ULTRASOUND N/A 07/29/2020   Procedure: ENDOBRONCHIAL ULTRASOUND;  Surgeon: Olalere, Adewale A, MD;  Location: [facility];  Service: Pulmonary;  Laterality: N/A;   ESOPHAGOGASTRODUODENOSCOPY Left 07/15/2018   Procedure:  ESOPHAGOGASTRODUODENOSCOPY (EGD);  Surgeon: Gessner, Carl E, MD;  Location: [facility];  Service: Gastroenterology;  Laterality: Left;   HEMOSTASIS CONTROL  07/29/2020   Procedure: HEMOSTASIS CONTROL;  Surgeon: Olalere, Adewale A, MD;  Location: [facility];  Service: Pulmonary;;   PACEMAKER INSERTION     Medtronic Enpulse dual-chamber pacemaker   PARTIAL MASTECTOMY WITH NEEDLE LOCALIZATION AND AXILLARY SENTINEL LYMPH NODE BX Right 12/03/2018   Procedure: PARTIAL MASTECTOMY WITH NEEDLE LOCALIZATION AND AXILLARY SENTINEL LYMPH NODE BX, RIGHT;  Surgeon: Cintron-Diaz, Edgardo, MD;  Location: [facility] ORS;  Service: General;  Laterality: Right;   PERMANENT PACEMAKER GENERATOR CHANGE N/A 07/30/2013   Procedure: PERMANENT PACEMAKER GENERATOR CHANGE;  Surgeon: Steven C Klein, MD;  Location: [facility];  Service: Cardiovascular;  Laterality: N/A;   PORTA CATH INSERTION N/A 08/16/2020   Procedure: PORTA CATH INSERTION;  Surgeon: Schnier, Gregory G, MD;  Location: [facility] INVASIVE CV LAB;  Service: Cardiovascular;  Laterality: N/A;   VIDEO BRONCHOSCOPY N/A 07/29/2020   Procedure: VIDEO BRONCHOSCOPY WITH FLUORO;  Surgeon: Olalere, Adewale A, MD;  Location: [facility];  Service: Pulmonary;  Laterality: N/A;    FAMILY HISTORY: Family History  Problem Relation Age of Onset   Hypertension Mother    Breast cancer Sister    Breast cancer Sister    Colon cancer Neg Hx    Esophageal cancer Neg Hx     ADVANCED DIRECTIVES (Y/N):  N  HEALTH MAINTENANCE: Social History   Tobacco Use   Smoking status: Former    Types: Cigarettes    Quit date: 07/25/2006    Years since quitting: 14.4   Smokeless tobacco: Never  Vaping Use   Vaping Use: Never used  Substance Use Topics   Alcohol use: No   Drug use: No     Colonoscopy:  PAP:  Bone density:  Lipid panel:  No Known Allergies  Current Outpatient Medications  Medication Sig Dispense Refill   acetaminophen (TYLENOL) 500 MG tablet Take 500-1,000 mg by mouth  every 6 (six) hours as needed for moderate pain.      alendronate (FOSAMAX) 70 MG tablet TAKE 1 TABLET (70 MG TOTAL) BY MOUTH ONCE A WEEK. TAKE WITH A FULL GLASS OF WATER ON AN EMPTY STOMACH. 12 tablet 2   allopurinol (ZYLOPRIM) 100 MG tablet Take 100 mg by mouth daily.     atorvastatin (LIPITOR) 20 MG tablet Take 1 tablet (20 mg total) by mouth daily. 30 tablet 0   Blood Pressure KIT Automated blood pressure measuring device. (Patient not taking: No sig reported) 1 each 0   calcium-vitamin D 250-100 MG-UNIT tablet Take 2 tablets by mouth daily.     clopidogrel (PLAVIX) 75 MG tablet Take 75 mg by mouth daily.     dexamethasone (DECADRON) 4 MG tablet Take 1 tablet (4 mg total) by mouth daily. 30 tablet 1   dextromethorphan-guaiFENesin (MUCINEX DM) 30-600 MG 12hr tablet Take 1 tablet by mouth 2 (two) times daily. (Patient not taking: No sig reported) 30 tablet 0   diltiazem (CARDIZEM CD) 120 MG 24 hr capsule Take 1 capsule (120 mg total) by mouth daily. 30 capsule 0   docusate sodium (COLACE) 100   MG capsule Take 100 mg by mouth 2 (two) times daily as needed for mild constipation.     donepezil (ARICEPT) 5 MG tablet Take 5 mg by mouth daily.     feeding supplement (ENSURE ENLIVE / ENSURE PLUS) LIQD Take 237 mLs by mouth 3 (three) times daily between meals. 10000 mL 0   letrozole (FEMARA) 2.5 MG tablet TAKE 1 TABLET BY MOUTH EVERY DAY 90 tablet 3   LUMIGAN 0.01 % SOLN Place 1 drop into both eyes at bedtime.  4   magnesium hydroxide (MILK OF MAGNESIA) 400 MG/5ML suspension Take 30 mLs by mouth once. for constipation. (Patient not taking: No sig reported)     Magnesium Oxide 250 MG TABS Take 250 mg by mouth daily.     mirtazapine (REMERON) 15 MG tablet Take 15 mg by mouth at bedtime.     Multiple Vitamin (MULTIVITAMIN WITH MINERALS) TABS tablet Take 1 tablet by mouth daily. (Patient not taking: No sig reported) 30 tablet 0   triamcinolone ointment (KENALOG) 0.5 % Apply 1 application topically 3 (three)  times daily. Apply to rash on back prn (Patient not taking: No sig reported) 30 g 0   No current facility-administered medications for this visit.   Facility-Administered Medications Ordered in Other Visits  Medication Dose Route Frequency Provider Last Rate Last Admin   heparin lock flush 100 unit/mL  500 Units Intravenous Once [name], MD       sodium chloride flush (NS) 0.9 % injection 10 mL  10 mL Intravenous PRN [name], MD   10 mL at 09/13/20 6599    OBJECTIVE: There were no vitals filed for this visit.    There is no height or weight on file to calculate BMI.    ECOG FS:2 - Symptomatic, <50% confined to bed  General: Well-developed, well-nourished, no acute distress.  Sitting in a wheelchair. Eyes: Pink conjunctiva, anicteric sclera. HEENT: Normocephalic, moist mucous membranes. Lungs: No audible wheezing or coughing. Heart: Regular rate and rhythm. Abdomen: Soft, nontender, no obvious distention. Musculoskeletal: No edema, cyanosis, or clubbing. Neuro: Alert, answering all questions appropriately. Cranial nerves grossly intact. Skin: No rashes or petechiae noted. Psych: Normal affect.   LAB RESULTS:  Lab Results  Component Value Date   NA 134 (L) 12/07/2020   K 3.6 12/07/2020   CL 99 12/07/2020   CO2 23 12/07/2020   GLUCOSE 158 (H) 12/07/2020   BUN 33 (H) 12/07/2020   CREATININE 0.67 12/07/2020   CALCIUM 9.0 12/07/2020   PROT 6.5 12/07/2020   ALBUMIN 3.4 (L) 12/07/2020   AST 29 12/07/2020   ALT 42 12/07/2020   ALKPHOS 51 12/07/2020   BILITOT 1.2 12/07/2020   GFRNONAA >60 12/07/2020   GFRAA >60 07/24/2018    Lab Results  Component Value Date   WBC 9.7 12/07/2020   NEUTROABS 7.9 (H) 12/07/2020   HGB 13.7 12/07/2020   HCT 42.2 12/07/2020   MCV 94.8 12/07/2020   PLT 101 (L) 12/07/2020     STUDIES: No results found.  ASSESSMENT: Pathologic stage Ia ER/PR positive, HER-2 negative invasive carcinoma of the upper outer quadrant of  the right breast.  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  PLAN:    1.  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  CT scan results from July 25, 2020 reviewed independently.  Patient underwent bronchoscopy on July 29, 2020 confirming the diagnosis.  PET scan results from August 15, 2020 reviewed  independently confirming stage of disease.  CT of the head did not reveal any metastatic lesions.  Patient completed 5 weekly cycles of carboplatinum and Taxol on October 10, 2020 and XRT on October 20, 2020.  Patient will now receive 1 year of maintenance durvalumab every 2 weeks which was initiated on November 23, 2020.  Proceed with cycle 2 of treatment today.  Return to clinic in 2 weeks for further evaluation and consideration of cycle 3.  Will reimage in September or October 2022.  Patient was also given a referral to palliative care.  2.  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  Given her advanced age and small size of malignancy, Oncotype DX or adjuvant chemotherapy was not necessary.  She was also evaluated by radiation oncology who determined that adjuvant XRT was also not needed.  Continue letrozole for a total of 5 years completing treatment in August 2025.  Treatment was discontinued during chemotherapy, but now can be reinitiated in conjunction with her immunotherapy.   Her most recent mammogram on November 13, 2019 was reported as BI-RADS 2, repeat in July 2022.    2.  Osteoporosis: Patient's most recent bone mineral density on January 14, 2020 reported T score of -2.6 which is mildly improved over 1 year prior where her T score was reported at -2.8.  She is tolerating letrozole well so we will continue this medication as prescribed.  Continue Fosamax, calcium, and vitamin D supplementation.  Repeat bone mineral density in September 2022.   3.   Weight loss: Improved.  Continue dexamethasone as prescribed. 4.  Thrombocytopenia: Mild, monitor.  Proceed with treatment as above  Patient expressed understanding and was in agreement with this plan. She also understands that She can call clinic at any time with any questions, concerns, or complaints.   Cancer Staging Malignant neoplasm of upper-outer quadrant of right breast in female, estrogen receptor positive ([place]) Staging form: Breast, AJCC 8th Edition - Clinical stage from 11/06/2018: Stage IA (cT1a, cN0, cM0, G1, ER+, PR+, HER2-) - Signed by [name], MD on 11/06/2018 Histologic grading system: 3 grade system  Squamous cell carcinoma of right lung [facility]) Staging form: Lung, AJCC 8th Edition - Pathologic stage from 08/05/2020: Stage IIIA (pT3, pN1, cM0) - Signed by [name], MD on 08/05/2020 Stage prefix: Initial diagnosis   [name], MD   12/22/2020 8:48 PM

## 2020-12-24 ENCOUNTER — Inpatient Hospital Stay
Admission: EM | Admit: 2020-12-24 | Discharge: 2020-12-29 | DRG: 947 | Disposition: A | Discharge status: Hospice | Payer: Medicare HMO | Attending: Internal Medicine | Admitting: Internal Medicine

## 2020-12-24 ENCOUNTER — Emergency Department: Payer: Medicare HMO

## 2020-12-24 ENCOUNTER — Observation Stay: Payer: Medicare HMO

## 2020-12-24 ENCOUNTER — Other Ambulatory Visit: Payer: Self-pay

## 2020-12-24 ENCOUNTER — Encounter: Payer: Self-pay | Admitting: Emergency Medicine

## 2020-12-24 DIAGNOSIS — E43 Unspecified severe protein-calorie malnutrition: Secondary | ICD-10-CM | POA: Diagnosis present

## 2020-12-24 DIAGNOSIS — C3492 Malignant neoplasm of unspecified part of left bronchus or lung: Secondary | ICD-10-CM | POA: Diagnosis present

## 2020-12-24 DIAGNOSIS — C3491 Malignant neoplasm of unspecified part of right bronchus or lung: Secondary | ICD-10-CM | POA: Diagnosis present

## 2020-12-24 DIAGNOSIS — Z9011 Acquired absence of right breast and nipple: Secondary | ICD-10-CM

## 2020-12-24 DIAGNOSIS — R627 Adult failure to thrive: Secondary | ICD-10-CM | POA: Diagnosis present

## 2020-12-24 DIAGNOSIS — D649 Anemia, unspecified: Secondary | ICD-10-CM | POA: Diagnosis present

## 2020-12-24 DIAGNOSIS — E876 Hypokalemia: Secondary | ICD-10-CM

## 2020-12-24 DIAGNOSIS — C3490 Malignant neoplasm of unspecified part of unspecified bronchus or lung: Secondary | ICD-10-CM

## 2020-12-24 DIAGNOSIS — Z8673 Personal history of transient ischemic attack (TIA), and cerebral infarction without residual deficits: Secondary | ICD-10-CM

## 2020-12-24 DIAGNOSIS — E785 Hyperlipidemia, unspecified: Secondary | ICD-10-CM | POA: Diagnosis present

## 2020-12-24 DIAGNOSIS — R634 Abnormal weight loss: Secondary | ICD-10-CM | POA: Diagnosis present

## 2020-12-24 DIAGNOSIS — Z87891 Personal history of nicotine dependence: Secondary | ICD-10-CM

## 2020-12-24 DIAGNOSIS — Z79899 Other long term (current) drug therapy: Secondary | ICD-10-CM

## 2020-12-24 DIAGNOSIS — M79606 Pain in leg, unspecified: Secondary | ICD-10-CM

## 2020-12-24 DIAGNOSIS — C50411 Malignant neoplasm of upper-outer quadrant of right female breast: Secondary | ICD-10-CM | POA: Diagnosis present

## 2020-12-24 DIAGNOSIS — N1831 Chronic kidney disease, stage 3a: Secondary | ICD-10-CM | POA: Diagnosis present

## 2020-12-24 DIAGNOSIS — Z803 Family history of malignant neoplasm of breast: Secondary | ICD-10-CM

## 2020-12-24 DIAGNOSIS — D696 Thrombocytopenia, unspecified: Secondary | ICD-10-CM | POA: Diagnosis present

## 2020-12-24 DIAGNOSIS — Z7902 Long term (current) use of antithrombotics/antiplatelets: Secondary | ICD-10-CM

## 2020-12-24 DIAGNOSIS — Z8249 Family history of ischemic heart disease and other diseases of the circulatory system: Secondary | ICD-10-CM

## 2020-12-24 DIAGNOSIS — Z66 Do not resuscitate: Secondary | ICD-10-CM | POA: Diagnosis present

## 2020-12-24 DIAGNOSIS — I10 Essential (primary) hypertension: Secondary | ICD-10-CM | POA: Diagnosis present

## 2020-12-24 DIAGNOSIS — I214 Non-ST elevation (NSTEMI) myocardial infarction: Secondary | ICD-10-CM | POA: Diagnosis present

## 2020-12-24 DIAGNOSIS — I2510 Atherosclerotic heart disease of native coronary artery without angina pectoris: Secondary | ICD-10-CM | POA: Diagnosis present

## 2020-12-24 DIAGNOSIS — R531 Weakness: Principal | ICD-10-CM

## 2020-12-24 DIAGNOSIS — Z515 Encounter for palliative care: Secondary | ICD-10-CM

## 2020-12-24 DIAGNOSIS — I129 Hypertensive chronic kidney disease with stage 1 through stage 4 chronic kidney disease, or unspecified chronic kidney disease: Secondary | ICD-10-CM | POA: Diagnosis present

## 2020-12-24 DIAGNOSIS — K219 Gastro-esophageal reflux disease without esophagitis: Secondary | ICD-10-CM | POA: Diagnosis present

## 2020-12-24 DIAGNOSIS — R778 Other specified abnormalities of plasma proteins: Secondary | ICD-10-CM | POA: Diagnosis present

## 2020-12-24 DIAGNOSIS — E538 Deficiency of other specified B group vitamins: Secondary | ICD-10-CM | POA: Diagnosis present

## 2020-12-24 DIAGNOSIS — Z950 Presence of cardiac pacemaker: Secondary | ICD-10-CM | POA: Diagnosis present

## 2020-12-24 DIAGNOSIS — Z20822 Contact with and (suspected) exposure to covid-19: Secondary | ICD-10-CM | POA: Diagnosis present

## 2020-12-24 DIAGNOSIS — Z170 Estrogen receptor positive status [ER+]: Secondary | ICD-10-CM

## 2020-12-24 DIAGNOSIS — R7989 Other specified abnormal findings of blood chemistry: Secondary | ICD-10-CM | POA: Diagnosis present

## 2020-12-24 DIAGNOSIS — F0390 Unspecified dementia without behavioral disturbance: Secondary | ICD-10-CM | POA: Diagnosis present

## 2020-12-24 LAB — RESP PANEL BY RT-PCR (FLU A&B, COVID) ARPGX2
Influenza A by PCR: NEGATIVE
Influenza B by PCR: NEGATIVE
SARS Coronavirus 2 by RT PCR: NEGATIVE

## 2020-12-24 LAB — BASIC METABOLIC PANEL
Anion gap: 16 — ABNORMAL HIGH (ref 5–15)
Anion gap: 12 (ref 5–15)
BUN: 35 mg/dL — ABNORMAL HIGH (ref 8–23)
BUN: 32 mg/dL — ABNORMAL HIGH (ref 8–23)
CO2: 22 mmol/L (ref 22–32)
CO2: 26 mmol/L (ref 22–32)
Calcium: 8.6 mg/dL — ABNORMAL LOW (ref 8.9–10.3)
Calcium: 8.5 mg/dL — ABNORMAL LOW (ref 8.9–10.3)
Chloride: 97 mmol/L — ABNORMAL LOW (ref 98–111)
Chloride: 99 mmol/L (ref 98–111)
Creatinine, Ser: 0.78 mg/dL (ref 0.44–1.00)
Creatinine, Ser: 0.69 mg/dL (ref 0.44–1.00)
GFR, Estimated: >60 mL/min (ref >60)
GFR, Estimated: >60 mL/min (ref >60)
Glucose, Bld: 151 mg/dL — ABNORMAL HIGH (ref 70–99)
Glucose, Bld: 106 mg/dL — ABNORMAL HIGH (ref 70–99)
Potassium: 3.2 mmol/L — ABNORMAL LOW (ref 3.5–5.1)
Potassium: 3 mmol/L — ABNORMAL LOW (ref 3.5–5.1)
Sodium: 135 mmol/L (ref 135–145)
Sodium: 137 mmol/L (ref 135–145)

## 2020-12-24 LAB — URINALYSIS, COMPLETE (UACMP) WITH MICROSCOPIC
Bilirubin Urine: NEGATIVE
Glucose, UA: NEGATIVE mg/dL
Ketones, ur: NEGATIVE mg/dL
Nitrite: NEGATIVE
Protein, ur: 100 mg/dL — AB
Specific Gravity, Urine: 1.017 (ref 1.005–1.030)
pH: 5 (ref 5.0–8.0)

## 2020-12-24 LAB — CBG MONITORING, ED: Glucose-Capillary: 103 mg/dL — ABNORMAL HIGH (ref 70–99)

## 2020-12-24 LAB — CBC
HCT: 38.2 % (ref 36.0–46.0)
Hemoglobin: 13 g/dL (ref 12.0–15.0)
MCH: 33.1 pg (ref 26.0–34.0)
MCHC: 34 g/dL (ref 30.0–36.0)
MCV: 97.2 fL (ref 80.0–100.0)
Platelets: UNDETERMINED 10*3/uL (ref 150–400)
RBC: 3.93 MIL/uL (ref 3.87–5.11)
RDW: 17.3 % — ABNORMAL HIGH (ref 11.5–15.5)
WBC: 6.3 10*3/uL (ref 4.0–10.5)
nRBC: 0 % (ref 0.0–0.2)

## 2020-12-24 LAB — TROPONIN I (HIGH SENSITIVITY)
Troponin I (High Sensitivity): 131 ng/L (ref <18)
Troponin I (High Sensitivity): 158 ng/L (ref <18)

## 2020-12-24 LAB — TSH: TSH: 1.012 u[IU]/mL (ref 0.350–4.500)

## 2020-12-24 LAB — MAGNESIUM: Magnesium: 1.7 mg/dL (ref 1.7–2.4)

## 2020-12-24 MED ORDER — DILTIAZEM HCL ER COATED BEADS 120 MG PO CP24
120.0000 mg | ORAL_CAPSULE | Freq: Every day | ORAL | Status: DC
  Filled 2020-12-24: qty 1

## 2020-12-24 MED ORDER — ENOXAPARIN SODIUM 40 MG/0.4ML IJ SOSY
40.0000 mg | PREFILLED_SYRINGE | INTRAMUSCULAR | Status: DC
  Administered 2020-12-24 – 2020-12-28 (×5): 40 mg via SUBCUTANEOUS
  Filled 2020-12-24 (×5): qty 0.4

## 2020-12-24 MED ORDER — ADULT MULTIVITAMIN W/MINERALS CH
1.0000 | ORAL_TABLET | Freq: Every day | ORAL | Status: DC
  Administered 2020-12-25 – 2020-12-29 (×5): 1 via ORAL
  Filled 2020-12-24 (×5): qty 1

## 2020-12-24 MED ORDER — ONDANSETRON HCL 4 MG PO TABS
4.0000 mg | ORAL_TABLET | Freq: Four times a day (QID) | ORAL | Status: DC | PRN
Start: 2020-12-24 — End: 2020-12-29

## 2020-12-24 MED ORDER — ENSURE ENLIVE PO LIQD
237.0000 mL | Freq: Three times a day (TID) | ORAL | Status: DC
  Administered 2020-12-25 – 2020-12-29 (×7): 237 mL via ORAL

## 2020-12-24 MED ORDER — POTASSIUM CITRATE-CITRIC ACID 1100-334 MG/5ML PO SOLN
40.0000 meq | Freq: Once | ORAL | Status: AC
  Administered 2020-12-24: 40 meq via ORAL
  Filled 2020-12-24: qty 20

## 2020-12-24 MED ORDER — LACTATED RINGERS IV BOLUS
1000.0000 mL | Freq: Once | INTRAVENOUS | Status: AC
  Administered 2020-12-24: 1000 mL via INTRAVENOUS

## 2020-12-24 MED ORDER — ONDANSETRON HCL 4 MG/2ML IJ SOLN: 4.0000 mg | Freq: Four times a day (QID) | INTRAMUSCULAR | Status: DC | PRN

## 2020-12-24 MED ORDER — POTASSIUM CITRATE-CITRIC ACID 1100-334 MG/5ML PO SOLN
40.0000 meq | Freq: Once | ORAL | Status: DC
  Filled 2020-12-24: qty 20

## 2020-12-24 MED ORDER — HYDRALAZINE HCL 10 MG PO TABS
10.0000 mg | ORAL_TABLET | Freq: Four times a day (QID) | ORAL | Status: DC | PRN
Start: 2020-12-24 — End: 2020-12-29
  Filled 2020-12-24: qty 1

## 2020-12-24 MED ORDER — ACETAMINOPHEN 650 MG RE SUPP: 650.0000 mg | Freq: Four times a day (QID) | RECTAL | Status: AC | PRN

## 2020-12-24 MED ORDER — ACETAMINOPHEN 325 MG PO TABS: 650.0000 mg | ORAL_TABLET | Freq: Four times a day (QID) | ORAL | Status: AC | PRN

## 2020-12-24 MED ORDER — SODIUM CHLORIDE 0.9 % IV SOLN
1.0000 g | Freq: Every morning | INTRAVENOUS | Status: AC
  Administered 2020-12-24 – 2020-12-27 (×3): 1 g via INTRAVENOUS
  Filled 2020-12-24 (×2): qty 1
  Filled 2020-12-24: qty 10

## 2020-12-24 MED ORDER — CLOPIDOGREL BISULFATE 75 MG PO TABS
75.0000 mg | ORAL_TABLET | Freq: Every day | ORAL | Status: DC
  Administered 2020-12-25 – 2020-12-29 (×5): 75 mg via ORAL
  Filled 2020-12-24 (×5): qty 1

## 2020-12-24 MED ORDER — SODIUM CHLORIDE 0.9 % IV BOLUS
500.0000 mL | Freq: Once | INTRAVENOUS | Status: AC
  Administered 2020-12-24: 500 mL via INTRAVENOUS

## 2020-12-24 MED ORDER — ATORVASTATIN CALCIUM 20 MG PO TABS
20.0000 mg | ORAL_TABLET | Freq: Every day | ORAL | Status: DC
  Administered 2020-12-25 – 2020-12-28 (×4): 20 mg via ORAL
  Filled 2020-12-24 (×4): qty 1

## 2020-12-24 MED ORDER — SODIUM CHLORIDE 0.9 % IV BOLUS
500.0000 mL | Freq: Once | INTRAVENOUS | Status: DC
  Administered 2020-12-24: 500 mL via INTRAVENOUS

## 2020-12-24 NOTE — ED Notes (Signed)
Pt at CT

## 2020-12-24 NOTE — ED Triage Notes (Addendum)
Pt via EMS from home. Pt c/o generalized weakness. Pt's sister noticed increased weakness. Denies pain. Pt is A&Ox2 at baseline. NAD. Pt has a hx of lung cancer. See first nurse note.

## 2020-12-24 NOTE — ED Notes (Signed)
Pt turned to right side, ginger ale and additional warm blankets provided, granddaughter leaving at this time

## 2020-12-24 NOTE — H&P (Signed)
History and Physical   [name] GLO:[id number] DOB: 12-Feb-1934 DOA: 12/24/2020  PCP: [facility]  Outpatient Specialists: Dr. Grayland Ormond, medical oncology Patient coming from: Home via EMS  I have personally briefly reviewed patient's old medical records in [facility].  Chief Concern: Weakness  HPI: [name] is a 87 y.o. 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  At bedside, she was able to tell me her full name, 87 year old, and that she is in the hospital. She states the year is 2020 and that [place] is the president.  Her sister states she has been getting progressively weaker over the last month. Patient has had decreased PO intake 12/23/20 and today, patient didn't want to eat her breakfast (oatmeal - her usual preferred) and poor PO fluid. She only drank enough to take her AM medications. Sister called Dr. Grayland Ormond and was advised to come to the ED for further evaluations.   Per sister, patient did not complain of anything. She endorsed to her sister that both her legs hurt. She's had increased urination.  She asked why is she in here, she states she feels fine. She states she lives with her momma.   Social history: She lives in her own apartment and has someone that looks in on her. She quit smoking 25 years ago. She does not drink etoh. She formerly worked in a [employer].   Vaccination history: She is  vaccinated, Moderna, 3 doses.   ROS: Constitutional: no weight change, no fever ENT/Mouth: no sore throat, no rhinorrhea Eyes: no eye pain, no vision changes Cardiovascular: no chest pain, no dyspnea,  no edema, no palpitations Respiratory: no cough, no sputum, no wheezing Gastrointestinal: no nausea, no vomiting, no diarrhea, no constipation Genitourinary: no urinary incontinence, no dysuria, no hematuria Musculoskeletal: no arthralgias, no myalgias Skin: no skin lesions, no pruritus, Neuro: + weakness, no loss of consciousness, no syncope Psych: no anxiety, no depression, + decrease appetite Heme/Lymph: no bruising, no bleeding  ED Course: Discussed with emergency medicine provider, patient requiring hospitalization for chief concerns of NSTEMI.  Vitals in the emergency department was remarkable for temperature 98.6, respiration rate 20, heart rate 64, blood pressure of 133/76 with a latest blood pressure 97/60.  Labs in the emergency department was remarkable for troponin of 131 and increased to 158.  TSH was within normal limits.  UA showed leukocytes.  WBC was 6.3, hemoglobin 13, platelets were not obtainable due to it clumping.  Sodium 137, potassium 3.2 and decreased to 3.0 on recheck.  Chloride 99, bicarb 26, BUN 32, serum creatinine of 0.69, nonfasting blood glucose 106.  In the emergency department patient was given 500 mL of sodium chloride bolus x2.  Assessment/Plan  Principal Problem:   Weakness Active Problems:   Pacemaker-Medtronic   History of CVA (cerebrovascular accident)   Essential hypertension   Hyperlipidemia   Elevated troponin   Loss of weight   Hypokalemia   Malignant neoplasm of upper-outer quadrant of right breast in female, estrogen receptor positive ([place])  Squamous cell carcinoma of right lung (HCC)   CAD (coronary artery disease)   Protein-calorie malnutrition, severe   NSTEMI (non-ST elevated myocardial infarction) ([place])   # Generalized weakness  and confusion-may be multifactorial in setting of hypokalemia and possible urinary tract infection with suprapubic tenderness and leukocytosis on UA # Mild hypotension, maintaining MAP - Urine culture ordered - Query failure to thrive - In setting of hypokalemia - Check magnesium, B12, ammonia - Admit to MedSurg, observation, telemetry ordered - If patient does not improve tomorrow with corrected electrolytes, would recommend a.m. team to consider MRI of the brain - Added additional LR 1 L bolus - Ceftriaxone IV  # Leg pain-bilateral lower extremity ultrasound to assess for DVT ordered  # Hypokalemia-Polycitra 40 mill equivalent once - Check magnesium  # Elevated troponin-I suspect this may be demand ischemia - Previous troponin on 11/07/2020 was 317 and decreased to 297  # Hyperlipidemia-atorvastatin 20 mg nightly  # History of hypertension-currently hypotensive at this time - Did not resume diltiazem Home medication  # Right breast cancer # Squamous cell carcinoma of the right lung - Outpatient follow-up with medical oncology  Chart reviewed.   Cardiac echo on 10/15/2020 was read as estimated 50% ejection fraction.  DVT prophylaxis: Enoxaparin nightly Code Status: full code   Diet: Heart healthy Family Communication: Updated sister, [name] (sister, HCPOA) and [name] (niece, HCPOA) Disposition Plan: Pending clinical course Consults called: None at this time Admission status: MedSurg, observation, telemetry  Past Medical History:  Diagnosis Date   Breast cancer, right ([place]) 11/2018   Mastectomy   C. difficile colitis    CKD (chronic kidney disease), stage III ([place])    Complete heart block ([place])    a. Originally placed 08/2004; b. 07/2013 s/p MDT [place] [facility] (ser # [id number]).   Dyspnea    Gait abnormality 07/23/2016   GERD (gastroesophageal reflux disease)    Gout    Hemoptysis 07/25/2020   History of hypertension    History of stroke     Hyperhomocysteinemia (HCC)    Hyperlipidemia    Hypertension    Hypokalemia 07/13/2018   Low blood magnesium 07/13/2018   Lung cancer ([place])    Pacemaker-Medtronic 08/07/2011   PAF (paroxysmal atrial fibrillation) ([place])    a. 07/2020 Device interrogation-->13.3% AF burden-->longest atrial high rate episode 24:26 mins-->AT vs AF. Not on [place].   Pulmonary hypertension ([place])    a. 06/2018 Echo: EF 60-65%, no rwma, nl RV fxn, RVSP 48.10mHg. Mild AoV Ca2+.   Rhabdomyolysis    Stroke ([facility]    left sided weakness, 07/28/20- no residual    Vomiting 07/13/2018   Past Surgical History:  Procedure Laterality Date   BIOPSY  07/15/2018   Procedure: BIOPSY;  Surgeon: GGatha Mayer MD;  Location: [facility] ENDOSCOPY;  Service: Gastroenterology;;   BREAST BIOPSY Right 10/27/2018   affirm bx of mass, x clip,  INVASIVE MAMMARY CARCINOMA   BREAST LUMPECTOMY Right 12/03/2018   BRONCHIAL BIOPSY  07/29/2020   Procedure: BRONCHIAL BIOPSIES;  Surgeon: OLaurin Coder MD;  Location: [facility]  Service: Pulmonary;;   BRONCHIAL NEEDLE ASPIRATION BIOPSY  07/29/2020   Procedure: BRONCHIAL NEEDLE ASPIRATION BIOPSIES;  Surgeon: OLaurin Coder MD;  Location[facility]  Service: Pulmonary;;   CARDIAC CATHETERIZATION     ENDOBRONCHIAL ULTRASOUND N/A 07/29/2020   Procedure: ENDOBRONCHIAL ULTRASOUND;  Surgeon: OLaurin Coder MD;  Location[facility]  Service: Pulmonary;  Laterality: N/A;   ESOPHAGOGASTRODUODENOSCOPY Left  07/15/2018   Procedure: ESOPHAGOGASTRODUODENOSCOPY (EGD);  Surgeon: [name], MD;  Location: [facility];  Service: Gastroenterology;  Laterality: Left;   HEMOSTASIS CONTROL  07/29/2020   Procedure: HEMOSTASIS CONTROL;  Surgeon: Laurin Coder, MD;  Location: [facility];  Service: Pulmonary;;   PACEMAKER INSERTION     Medtronic Enpulse dual-chamber pacemaker   PARTIAL MASTECTOMY WITH NEEDLE LOCALIZATION AND AXILLARY SENTINEL LYMPH NODE BX Right 12/03/2018   Procedure: PARTIAL  MASTECTOMY WITH NEEDLE LOCALIZATION AND AXILLARY SENTINEL LYMPH NODE BX, RIGHT;  Surgeon: [name], MD;  Location: [facility] ORS;  Service: General;  Laterality: Right;   PERMANENT PACEMAKER GENERATOR CHANGE N/A 07/30/2013   Procedure: PERMANENT PACEMAKER GENERATOR CHANGE;  Surgeon: [name], MD;  Location[facility] C[facility] LAB;  Service: Cardiovascular;  Laterality: N/A;   PORTA CATH INSERTION N/A 08/16/2020   Procedure: PORTA CATH INSERTION;  Surgeon: Katha Cabal, MD;  Location: [facility] CV LAB;  Service: Cardiovascular;  Laterality: N/A;   VIDEO BRONCHOSCOPY N/A 07/29/2020   Procedure: VIDEO BRONCHOSCOPY WITH FLUORO;  Surgeon: Laurin Coder, MD;  Location: [facility] ENDOSCOPY;  Service: Pulmonary;  Laterality: N/A;   Social History:  reports that she quit smoking about 14 years ago. Her smoking use included cigarettes. She has never used smokeless tobacco. She reports that she does not drink alcohol and does not use drugs.  No Known Allergies Family History  Problem Relation Age of Onset   Hypertension Mother    Breast cancer Sister    Breast cancer Sister    Colon cancer Neg Hx    Esophageal cancer Neg Hx    Family history: Family history reviewed and not pertinent  Prior to Admission medications   Medication Sig Start Date End Date Taking? Authorizing Provider  acetaminophen (TYLENOL) 500 MG tablet Take 500-1,000 mg by mouth every 6 (six) hours as needed for moderate pain.     [provider]  alendronate (FOSAMAX) 70 MG tablet TAKE 1 TABLET (70 MG TOTAL) BY MOUTH ONCE A WEEK. TAKE WITH A FULL GLASS OF WATER ON AN EMPTY STOMACH. 03/14/20   [name], MD  allopurinol (ZYLOPRIM) 100 MG tablet Take 100 mg by mouth daily. 11/28/19   [provider]  atorvastatin (LIPITOR) 20 MG tablet Take 1 tablet (20 mg total) by mouth daily. 10/18/20 10/18/21  Lavina Hamman, MD  Blood Pressure KIT Automated blood pressure measuring device. Patient not taking: No  sig reported 06/29/18   [name], MD  calcium-vitamin D 250-100 MG-UNIT tablet Take 2 tablets by mouth daily.    [provider]  clopidogrel (PLAVIX) 75 MG tablet Take 75 mg by mouth daily.    [provider]  dexamethasone (DECADRON) 4 MG tablet Take 1 tablet (4 mg total) by mouth daily. 09/27/20   [name], MD  dextromethorphan-guaiFENesin (MUCINEX DM) 30-600 MG 12hr tablet Take 1 tablet by mouth 2 (two) times daily. Patient not taking: No sig reported 10/18/20   Lavina Hamman, MD  diltiazem (CARDIZEM CD) 120 MG 24 hr capsule Take 1 capsule (120 mg total) by mouth daily. 10/19/20   Lavina Hamman, MD  docusate sodium (COLACE) 100 MG capsule Take 100 mg by mouth 2 (two) times daily as needed for mild constipation.    [provider]  donepezil (ARICEPT) 5 MG tablet Take 5 mg by mouth daily. 11/29/20   [provider]  feeding supplement (ENSURE ENLIVE / ENSURE PLUS) LIQD Take 237 mLs by mouth 3 (  three) times daily between meals. 10/18/20   Lavina Hamman, MD  letrozole [place]) 2.5 MG tablet TAKE 1 TABLET BY MOUTH EVERY DAY 11/26/20   [name], MD  LUMIGAN 0.01 % SOLN Place 1 drop into both eyes at bedtime. 01/13/18   [provider]  magnesium hydroxide (MILK OF MAGNESIA) 400 MG/5ML suspension Take 30 mLs by mouth once. for constipation. Patient not taking: Reported on 12/24/2020    [provider]  Magnesium Oxide 250 MG TABS Take 250 mg by mouth daily.    [provider]  mirtazapine (REMERON) 15 MG tablet Take 15 mg by mouth at bedtime. 11/29/20   [provider]  Multiple Vitamin (MULTIVITAMIN WITH MINERALS) TABS tablet Take 1 tablet by mouth daily. Patient not taking: No sig reported 10/19/20   Lavina Hamman, MD  pantoprazole (PROTONIX) 40 MG tablet Take 40 mg by mouth daily. 12/13/20   [provider]  simvastatin (ZOCOR) 40 MG tablet Take 1 tablet by mouth daily. 12/20/20   [provider]  triamcinolone ointment (KENALOG) 0.5 % Apply 1 application topically 3 (three) times daily. Apply to rash on back prn Patient not taking: No sig reported 11/29/20   Jacquelin Hawking, NP  prochlorperazine (COMPAZINE) 10 MG tablet Take 1 tablet (10 mg total) by mouth every 6 (six) hours as needed (Nausea or vomiting). 08/17/20 11/14/20  [name], MD   Physical Exam: Vitals:   12/24/20 1945 12/24/20 2015 12/24/20 2030 12/24/20 2115  BP: (!) 119/47 (!) 108/52 (!) 104/57 (!) 97/50  Pulse: (!) 59 (!) 59 60 60  Resp:   17   Temp:      TempSrc:      SpO2: 94% 94% 92% 94%  Weight:      Height:       Constitutional: appears age appropriate, NAD, calm, comfortable Eyes: PERRL, lids and conjunctivae normal ENMT: Mucous membranes are moist. Posterior pharynx clear of any exudate or lesions. Age-appropriate dentition. Mild hearing loss Neck: normal, supple, no masses, no thyromegaly Respiratory: clear to auscultation bilaterally, no wheezing, no crackles. Normal respiratory effort. No accessory muscle use.  Cardiovascular: Regular rate and rhythm, no murmurs / rubs / gallops. No extremity edema. 2+ pedal pulses. No carotid bruits.  Abdomen: no tenderness, no masses palpated, no hepatosplenomegaly. Bowel sounds positive.  Musculoskeletal: no clubbing / cyanosis. No joint deformity upper and lower extremities. Good ROM, no contractures, no atrophy. Normal muscle tone.  Skin: no rashes, lesions, ulcers. No induration Neurologic: Sensation intact. Strength 5/5 in all 4.  Psychiatric: Normal judgment and insight. Alert and oriented x 3. Normal mood.   EKG: independently reviewed, showing ventricular paced, rate of 66, QTc 494  Chest x-ray on Admission: I personally reviewed and I agree with radiologist reading as below.  DG Chest 2 View  Result Date: 12/24/2020 CLINICAL DATA:  History of lung cancer.  Increased weakness. EXAM: CHEST - 2 VIEW COMPARISON:  X-ray dated  11/07/2020. FINDINGS: Heart size and mediastinal contours are stable. LEFT chest wall pacemaker/ICD apparatus appears grossly stable in position. RIGHT chest wall Port-A-Cath appears grossly stable in position with tip at the level of the mid/lower SVC. RIGHT basilar atelectasis, similar to earlier exams. Additional chronic scarring/atelectasis at the LEFT lung base. Study is hypoinspiratory. No confluent opacity to suggest a developing pneumonia. No pleural effusion or pneumothorax is seen. Osseous structures about the chest are unremarkable. IMPRESSION: Low lung volumes. No acute findings. No evidence of pneumonia or  alveolar pulmonary edema. Electronically Signed   By: Franki Cabot M.D.   On: 12/24/2020 16:17   CT HEAD WO CONTRAST (5MM)  Result Date: 12/24/2020 CLINICAL DATA:  Altered mental status. EXAM: CT HEAD WITHOUT CONTRAST TECHNIQUE: Contiguous axial images were obtained from the base of the skull through the vertex without intravenous contrast. COMPARISON:  October 15, 2020. FINDINGS: Brain: Mild diffuse cortical atrophy is noted. Mild chronic ischemic white matter disease is noted. No mass effect or midline shift is noted. Ventricular size is within normal limits. There is no evidence of mass lesion, hemorrhage or acute infarction. Vascular: No hyperdense vessel or unexpected calcification. Skull: Normal. Negative for fracture or focal lesion. Sinuses/Orbits: No acute finding. Other: None. IMPRESSION: No acute intracranial abnormality seen. Electronically Signed   By: Marijo Conception M.D.   On: 12/24/2020 16:21    Labs on Admission: I have personally reviewed following labs  CBC: Recent Labs  Lab 12/24/20 1510  WBC 6.3  HGB 13.0  HCT 38.2  MCV 97.2  PLT PLATELET CLUMPS NOTED ON SMEAR, UNABLE TO ESTIMATE   Basic Metabolic Panel: Recent Labs  Lab 12/24/20 1510 12/24/20 2033  NA 135 137  K 3.2* 3.0*  CL 97* 99  CO2 22 26  GLUCOSE 151* 106*  BUN 35* 32*  CREATININE 0.78 0.69   CALCIUM 8.6* 8.5*   GFR: Estimated Creatinine Clearance: 38.6 mL/min (by C-G formula based on SCr of 0.69 mg/dL).  CBG: Recent Labs  Lab 12/24/20 2046  GLUCAP 103*   Lipid Profile: No results for input(s): CHOL, HDL, LDLCALC, TRIG, CHOLHDL, LDLDIRECT in the last 72 hours.  Thyroid Function Tests: Recent Labs    12/24/20 1510  TSH 1.012    Urine analysis:    Component Value Date/Time   COLORURINE AMBER (A) 12/24/2020 1750   APPEARANCEUR HAZY (A) 12/24/2020 1750   LABSPEC 1.017 12/24/2020 1750   PHURINE 5.0 12/24/2020 1750   GLUCOSEU NEGATIVE 12/24/2020 1750   HGBUR SMALL (A) 12/24/2020 1750   BILIRUBINUR NEGATIVE 12/24/2020 1750   KETONESUR NEGATIVE 12/24/2020 1750   PROTEINUR 100 (A) 12/24/2020 1750   NITRITE NEGATIVE 12/24/2020 1750   LEUKOCYTESUR TRACE (A) 12/24/2020 1750   Dr. Tobie Poet [facility]  If 7PM-7AM, please contact overnight-coverage provider If 7AM-7PM, please contact day coverage provider [email or web address]  12/24/2020, 10:53 PM

## 2020-12-24 NOTE — ED Notes (Signed)
Doppler study bedside

## 2020-12-24 NOTE — ED Provider Notes (Signed)
[facility] Emergency Department Provider Note ____________________________________________   Event Date/Time   First MD Initiated Contact with Patient 12/24/20 1523     (approximate)  I have reviewed the triage vital signs and the nursing notes.   HISTORY  Chief Complaint Weakness    HPI [name] is a 87 y.o. female with PMH as noted below including chronic kidney disease, lung cancer, hypertension, and pacemaker presents with generalized weakness over an unclear time course.  Per EMS, the sister noted that the patient has become increasingly weak and had trouble focusing.  The patient herself states she is here because "I guess I'm supposed to be sick,"  but denies feeling weak or dizzy and has no other acute complaints.   Past Medical History:  Diagnosis Date   Breast cancer, right ([place]) 11/2018   Mastectomy   C. difficile colitis    CKD (chronic kidney disease), stage III (HCC)    Complete heart block ([place])    a. Originally placed 08/2004; b. 07/2013 s/p MDT [place] PPM (ser # [id number]).   Dyspnea    Gait abnormality 07/23/2016   GERD (gastroesophageal reflux disease)    Gout    Hemoptysis 07/25/2020   History of hypertension    History of stroke    Hyperhomocysteinemia (HCC)    Hyperlipidemia    Hypertension    Hypokalemia 07/13/2018   Low blood magnesium 07/13/2018   Lung cancer ([place])    Pacemaker-Medtronic 08/07/2011   PAF (paroxysmal atrial fibrillation) ([place])    a. 07/2020 Device interrogation-->13.3% AF burden-->longest atrial high rate episode 24:26 mins-->AT vs AF. Not on [place].   Pulmonary hypertension ([place])    a. 06/2018 Echo: EF 60-65%, no rwma, nl RV fxn, RVSP 48.75mHg. Mild AoV Ca2+.   Rhabdomyolysis    Stroke ([place]    left sided weakness, 07/28/20- no residual    Vomiting 07/13/2018    Patient Active Problem List   Diagnosis Date Noted   Community acquired pneumonia 11/08/2020   Postobstructive  pneumonia 11/08/2020   Demand ischemia ([place] 11/08/2020   NSTEMI (non-ST elevated myocardial infarction) ([place] 11/07/2020   Leukopenia due to antineoplastic chemotherapy ([place] 11/07/2020   Protein-calorie malnutrition, severe 10/17/2020   Weakness 10/15/2020   UTI (urinary tract infection) 10/15/2020   Fall at home, initial encounter 10/15/2020   CKD (chronic kidney disease), stage IIIa 10/15/2020   Hyperkalemia 10/15/2020   Hypercalcemia 10/15/2020   Severe sepsis ([place] 10/15/2020   Squamous cell carcinoma of right lung ([facility] 08/05/2020   Hemoptysis 07/25/2020   Malignant neoplasm of upper-outer quadrant of right breast in female, estrogen receptor positive ([place] 10/31/2018   Enteritis due to Clostridium difficile    Malnutrition of moderate degree 07/15/2018   Cough    Ileus (HCC)    Hypokalemia 07/13/2018   Low blood magnesium 07/13/2018   Vomiting 07/13/2018   Loss of weight 06/27/2018   Rhabdomyolysis 05/26/2018   History of CVA (cerebrovascular accident) 05/26/2018   Hyperlipidemia 05/26/2018   AKI (acute kidney injury) ([place] 05/26/2018   Transaminitis 05/26/2018   Elevated troponin 05/26/2018   Gait abnormality 07/23/2016   Essential hypertension 06/11/2016   Hypertensive urgency 06/11/2016   CAD (coronary artery disease) 06/11/2016   Pacemaker-Medtronic 08/07/2011   Atrial fibrillation ([place] 08/07/2011   Daytime somnolence 08/07/2011   HYPERTENSION, HEART CONTROLLED W/O ASSOC CHF 06/07/2010   AV BLOCK, COMPLETE 06/07/2010   Cerebral artery occlusion with cerebral infarction ([facility] 06/07/2010    Past Surgical  History:  Procedure Laterality Date   BIOPSY  07/15/2018   Procedure: BIOPSY;  Surgeon: [name], MD;  Location: [facility];  Service: Gastroenterology;;   BREAST BIOPSY Right 10/27/2018   affirm bx of mass, x clip,  INVASIVE MAMMARY CARCINOMA   BREAST LUMPECTOMY Right 12/03/2018   BRONCHIAL BIOPSY  07/29/2020   Procedure: BRONCHIAL BIOPSIES;   Surgeon: Laurin Coder, MD;  Location: [place];  Service: Pulmonary;;   BRONCHIAL NEEDLE ASPIRATION BIOPSY  07/29/2020   Procedure: BRONCHIAL NEEDLE ASPIRATION BIOPSIES;  Surgeon: Laurin Coder, MD;  Location: [place];  Service: Pulmonary;;   CARDIAC CATHETERIZATION     ENDOBRONCHIAL ULTRASOUND N/A 07/29/2020   Procedure: ENDOBRONCHIAL ULTRASOUND;  Surgeon: Laurin Coder, MD;  Location: [place];  Service: Pulmonary;  Laterality: N/A;   ESOPHAGOGASTRODUODENOSCOPY Left 07/15/2018   Procedure: ESOPHAGOGASTRODUODENOSCOPY (EGD);  Surgeon: [name], MD;  Location: [facility];  Service: Gastroenterology;  Laterality: Left;   HEMOSTASIS CONTROL  07/29/2020   Procedure: HEMOSTASIS CONTROL;  Surgeon: Laurin Coder, MD;  Location: [facility];  Service: Pulmonary;;   PACEMAKER INSERTION     Medtronic Enpulse dual-chamber pacemaker   PARTIAL MASTECTOMY WITH NEEDLE LOCALIZATION AND AXILLARY SENTINEL LYMPH NODE BX Right 12/03/2018   Procedure: PARTIAL MASTECTOMY WITH NEEDLE LOCALIZATION AND AXILLARY SENTINEL LYMPH NODE BX, RIGHT;  Surgeon: [name], MD;  Location: [facility]S;  Service: General;  Laterality: Right;   PERMANENT PACEMAKER GENERATOR CHANGE N/A 07/30/2013   Procedure: PERMANENT PACEMAKER GENERATOR CHANGE;  Surgeon: [name], MD;  Location: [facility] CATH LAB;  Service: Cardiovascular;  Laterality: N/A;   PORTA CATH INSERTION N/A 08/16/2020   Procedure: PORTA CATH INSERTION;  Surgeon: Katha Cabal, MD;  Location: [place] CV LAB;  Service: Cardiovascular;  Laterality: N/A;   VIDEO BRONCHOSCOPY N/A 07/29/2020   Procedure: VIDEO BRONCHOSCOPY WITH FLUORO;  Surgeon: Laurin Coder, MD;  Location: [facility] [place] [facility];  Service: Pulmonary;  Laterality: N/A;    Prior to Admission medications   Medication Sig Start Date End Date Taking? Authorizing Provider  acetaminophen (TYLENOL) 500 MG tablet Take 500-1,000 mg by mouth every 6 (six) hours as needed for  moderate pain.    Yes [provider]  alendronate (FOSAMAX) 70 MG tablet TAKE 1 TABLET (70 MG TOTAL) BY MOUTH ONCE A WEEK. TAKE WITH A FULL GLASS OF WATER ON AN EMPTY STOMACH. 03/14/20  Yes [name], MD  allopurinol (ZYLOPRIM) 100 MG tablet Take 100 mg by mouth daily. 11/28/19  Yes [provider]  atorvastatin (LIPITOR) 20 MG tablet Take 1 tablet (20 mg total) by mouth daily. 10/18/20 10/18/21 Yes Lavina Hamman, MD  calcium-vitamin D 250-100 MG-UNIT tablet Take 2 tablets by mouth daily.   Yes [provider]  clopidogrel (PLAVIX) 75 MG tablet Take 75 mg by mouth daily.   Yes [provider]  dexamethasone (DECADRON) 4 MG tablet Take 1 tablet (4 mg total) by mouth daily. 09/27/20  Yes [name], MD  diltiazem (CARDIZEM CD) 120 MG 24 hr capsule Take 1 capsule (120 mg total) by mouth daily. 10/19/20  Yes Lavina Hamman, MD  docusate sodium (COLACE) 100 MG capsule Take 100 mg by mouth 2 (two) times daily as needed for mild constipation.   Yes [provider]  donepezil (ARICEPT) 5 MG tablet Take 5 mg by mouth daily. 11/29/20  Yes [provider]  feeding supplement (ENSURE ENLIVE / ENSURE PLUS) LIQD Take 237 mLs by mouth 3 (  three) times daily between meals. 10/18/20  Yes Lavina Hamman, MD  letrozole [facility]) 2.5 MG tablet TAKE 1 TABLET BY MOUTH EVERY DAY 11/26/20  Yes Finnegan, [name], MD  LUMIGAN 0.01 % SOLN Place 1 drop into both eyes at bedtime. 01/13/18  Yes [provider]  Magnesium Oxide 250 MG TABS Take 250 mg by mouth daily.   Yes [provider]  mirtazapine (REMERON) 15 MG tablet Take 15 mg by mouth at bedtime. 11/29/20  Yes [provider]  pantoprazole (PROTONIX) 40 MG tablet Take 40 mg by mouth daily. 12/13/20  Yes [provider]  simvastatin (ZOCOR) 40 MG tablet Take 1 tablet by mouth daily. 12/20/20  Yes [provider]  Blood Pressure KIT Automated blood pressure  measuring device. Patient not taking: No sig reported 06/29/18   [name], MD  dextromethorphan-guaiFENesin (MUCINEX DM) 30-600 MG 12hr tablet Take 1 tablet by mouth 2 (two) times daily. Patient not taking: No sig reported 10/18/20   Lavina Hamman, MD  magnesium hydroxide (MILK OF MAGNESIA) 400 MG/5ML suspension Take 30 mLs by mouth once. for constipation. Patient not taking: No sig reported    [provider]  Multiple Vitamin (MULTIVITAMIN WITH MINERALS) TABS tablet Take 1 tablet by mouth daily. Patient not taking: No sig reported 10/19/20   Lavina Hamman, MD  triamcinolone ointment (KENALOG) 0.5 % Apply 1 application topically 3 (three) times daily. Apply to rash on back prn Patient not taking: No sig reported 11/29/20   Jacquelin Hawking, NP  prochlorperazine (COMPAZINE) 10 MG tablet Take 1 tablet (10 mg total) by mouth every 6 (six) hours as needed (Nausea or vomiting). 08/17/20 11/14/20  [name], MD    Allergies Patient has no known allergies.  Family History  Problem Relation Age of Onset   Hypertension Mother    Breast cancer Sister    Breast cancer Sister    Colon cancer Neg Hx    Esophageal cancer Neg Hx     Social History Social History   Tobacco Use   Smoking status: Former    Types: Cigarettes    Quit date: 07/25/2006    Years since quitting: 14.4   Smokeless tobacco: Never  Vaping Use   Vaping Use: Never used  Substance Use Topics   Alcohol use: No   Drug use: No    Review of Systems  Constitutional: No fever.  No weakness.  Eyes: No visual changes. ENT: No sore throat. Cardiovascular: Denies chest pain. Respiratory: Denies shortness of breath. Gastrointestinal: No vomiting or diarrhea.  No abdominal pain. Genitourinary: Negative for dysuria.  Musculoskeletal: Negative for back pain. Skin: Negative for rash. Neurological: Negative for headache.   ____________________________________________   PHYSICAL EXAM:  VITAL  SIGNS: ED Triage Vitals  Enc Vitals Group     BP 12/24/20 1506 133/74     Pulse Rate 12/24/20 1506 64     Resp 12/24/20 1506 20     Temp 12/24/20 1506 98.6 F (37 C)     Temp Source 12/24/20 1506 Oral     SpO2 12/24/20 1506 95 %     Weight 12/24/20 1507 105 lb (47.6 kg)     Height 12/24/20 1507 5' 4"  (1.626 m)     Head Circumference --      Peak Flow --      Pain Score 12/24/20 1507 0     Pain Loc --      Pain Edu? --  Excl. in [place]? --     Constitutional: Alert oriented x2.  Comfortable appearing and in no acute distress. Eyes: Conjunctivae are normal.  EOMI.  PERRLA. Head: Atraumatic. Nose: No congestion/rhinnorhea. Mouth/Throat: Mucous membranes are slightly dry. Neck: Normal range of motion.  Cardiovascular: Normal rate, regular rhythm. Good peripheral circulation. Respiratory: Normal respiratory effort.  No retractions. Gastrointestinal: Soft and nontender. No distention.  Genitourinary: No flank tenderness. Musculoskeletal: No lower extremity edema.  Extremities warm and well perfused.  Neurologic:  Normal speech and language.  5/5 motor strength intact in all extremities.  Normal coordination. Skin:  Skin is warm and dry. No rash noted. Psychiatric: Calm and cooperative.  ____________________________________________   LABS (all labs ordered are listed, but only abnormal results are displayed)  Labs Reviewed  BASIC METABOLIC PANEL - Abnormal; Notable for the following components:      Result Value   Potassium 3.2 (*)    Chloride 97 (*)    Glucose, Bld 151 (*)    BUN 35 (*)    Calcium 8.6 (*)    Anion gap 16 (*)    All other components within normal limits  CBC - Abnormal; Notable for the following components:   RDW 17.3 (*)    All other components within normal limits  URINALYSIS, COMPLETE (UACMP) WITH MICROSCOPIC - Abnormal; Notable for the following components:   Color, Urine AMBER (*)    APPearance HAZY (*)    Hgb urine dipstick SMALL (*)    Protein,  ur 100 (*)    Leukocytes,Ua TRACE (*)    Bacteria, UA MANY (*)    All other components within normal limits  BASIC METABOLIC PANEL - Abnormal; Notable for the following components:   Potassium 3.0 (*)    Glucose, Bld 106 (*)    BUN 32 (*)    Calcium 8.5 (*)    All other components within normal limits  CBG MONITORING, ED - Abnormal; Notable for the following components:   Glucose-Capillary 103 (*)    All other components within normal limits  TROPONIN I (HIGH SENSITIVITY) - Abnormal; Notable for the following components:   Troponin I (High Sensitivity) 131 (*)    All other components within normal limits  TROPONIN I (HIGH SENSITIVITY) - Abnormal; Notable for the following components:   Troponin I (High Sensitivity) 158 (*)    All other components within normal limits  RESP PANEL BY RT-PCR (FLU A&B, COVID) [id number]  TSH  BASIC METABOLIC PANEL  CBC  VITAMIN B12  MAGNESIUM  TROPONIN I (HIGH SENSITIVITY)   ____________________________________________  EKG  ED ECG REPORT I, Arta Silence, the attending physician, personally viewed and interpreted this ECG.  Date: 12/24/2020 EKG Time: 1513 Rate: 66 Rhythm: Ventricular paced rhythm QRS Axis: normal Intervals: normal ST/T Wave abnormalities: normal Narrative Interpretation: Paced rhythm with no evidence of acute ischemia  ____________________________________________  RADIOLOGY  Chest x-ray interpreted by me shows no focal consolidation or edema CT head: No ICH acute stroke or other acute abnormality  ____________________________________________   PROCEDURES  Procedure(s) performed: No  Procedures  Critical Care performed: No ____________________________________________   INITIAL IMPRESSION / ASSESSMENT AND PLAN / ED COURSE  Pertinent labs & imaging results that were available during my care of the patient were reviewed by me and considered in my medical decision making (see chart for details).    87 year old female with PMH as noted above including CKD, lung cancer, hypertension, pacemaker presents with generalized weakness and possible altered mental status of unclear  time course.  I reviewed the past medical records in [place].  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  On exam she is overall well-appearing and is alert and oriented x2 which per EMS is her baseline mental status.  Neurologic exam is nonfocal; there was some history of possible left-sided weakness although I cannot elicit any focal neurodeficits.  The physical exam is otherwise unremarkable.  Differential is broad but includes dehydration, electrolyte abnormality, AKI, other metabolic cause, UTI or other infection or less likely cardiac or primary CNS cause.  We will obtain lab work-up, CT head, chest x-ray, COVID swab, and reassess.  ----------------------------------------- 10:34 PM on 12/24/2020 -----------------------------------------  Initial work-up was mostly unrevealing other than the BMP showed an elevated anion gap of unclear etiology.  The initial troponin was elevated.  The patient was admitted last month with an elevated troponin that was somewhat higher.  This was thought to be due to demand ischemia.  The patient does not have any chest pain and has no EKG changes.  I initially felt that this could just be trending down from last month, however repeat troponin was slightly higher.  Repeat BMP showed a normal anion gap after fluids.  There is no evidence of UTI or other acute infection.  The patient may just be overall decompensating relating to her lung cancer and the treatment for it, or this could be related to dehydration.  After further discussion with the patient and her niece, we will admit for further work-up.  I consulted Dr. Tobie Poet from the hospitalist  service.  ____________________________________________   FINAL CLINICAL IMPRESSION(S) / ED DIAGNOSES  Final diagnoses:  None      NEW MEDICATIONS STARTED DURING THIS VISIT:  New Prescriptions   No medications on file     Note:  This document was prepared using Dragon voice recognition software and may include unintentional dictation errors.    Arta Silence, MD 12/24/20 2237

## 2020-12-24 NOTE — ED Triage Notes (Addendum)
[name] comes ems from home. Is a cancer center [name]. Sister noticed increased weakness and trouble focusing. [name] wants [name] to be seen. Aox2. Denies pain at this time. Has a pacemaker. VSS. CBG 149. Hx of left sided weakness for about a month.

## 2020-12-25 DIAGNOSIS — N1831 Chronic kidney disease, stage 3a: Secondary | ICD-10-CM | POA: Diagnosis present

## 2020-12-25 DIAGNOSIS — I2510 Atherosclerotic heart disease of native coronary artery without angina pectoris: Secondary | ICD-10-CM | POA: Diagnosis present

## 2020-12-25 DIAGNOSIS — Z515 Encounter for palliative care: Secondary | ICD-10-CM | POA: Diagnosis not present

## 2020-12-25 DIAGNOSIS — I129 Hypertensive chronic kidney disease with stage 1 through stage 4 chronic kidney disease, or unspecified chronic kidney disease: Secondary | ICD-10-CM | POA: Diagnosis present

## 2020-12-25 DIAGNOSIS — R4182 Altered mental status, unspecified: Secondary | ICD-10-CM | POA: Diagnosis not present

## 2020-12-25 DIAGNOSIS — R627 Adult failure to thrive: Secondary | ICD-10-CM | POA: Diagnosis present

## 2020-12-25 DIAGNOSIS — C50411 Malignant neoplasm of upper-outer quadrant of right female breast: Secondary | ICD-10-CM | POA: Diagnosis present

## 2020-12-25 DIAGNOSIS — D696 Thrombocytopenia, unspecified: Secondary | ICD-10-CM | POA: Diagnosis present

## 2020-12-25 DIAGNOSIS — Z20822 Contact with and (suspected) exposure to covid-19: Secondary | ICD-10-CM | POA: Diagnosis present

## 2020-12-25 DIAGNOSIS — E785 Hyperlipidemia, unspecified: Secondary | ICD-10-CM | POA: Diagnosis present

## 2020-12-25 DIAGNOSIS — C3491 Malignant neoplasm of unspecified part of right bronchus or lung: Secondary | ICD-10-CM | POA: Diagnosis present

## 2020-12-25 DIAGNOSIS — Z8673 Personal history of transient ischemic attack (TIA), and cerebral infarction without residual deficits: Secondary | ICD-10-CM | POA: Diagnosis not present

## 2020-12-25 DIAGNOSIS — C3490 Malignant neoplasm of unspecified part of unspecified bronchus or lung: Secondary | ICD-10-CM | POA: Diagnosis not present

## 2020-12-25 DIAGNOSIS — C3492 Malignant neoplasm of unspecified part of left bronchus or lung: Secondary | ICD-10-CM | POA: Diagnosis present

## 2020-12-25 DIAGNOSIS — R531 Weakness: Secondary | ICD-10-CM | POA: Diagnosis present

## 2020-12-25 DIAGNOSIS — Z66 Do not resuscitate: Secondary | ICD-10-CM | POA: Diagnosis present

## 2020-12-25 DIAGNOSIS — Z87891 Personal history of nicotine dependence: Secondary | ICD-10-CM | POA: Diagnosis not present

## 2020-12-25 DIAGNOSIS — E538 Deficiency of other specified B group vitamins: Secondary | ICD-10-CM | POA: Diagnosis present

## 2020-12-25 DIAGNOSIS — Z950 Presence of cardiac pacemaker: Secondary | ICD-10-CM | POA: Diagnosis not present

## 2020-12-25 DIAGNOSIS — D649 Anemia, unspecified: Secondary | ICD-10-CM | POA: Diagnosis present

## 2020-12-25 DIAGNOSIS — Z170 Estrogen receptor positive status [ER+]: Secondary | ICD-10-CM | POA: Diagnosis not present

## 2020-12-25 DIAGNOSIS — F0390 Unspecified dementia without behavioral disturbance: Secondary | ICD-10-CM | POA: Diagnosis present

## 2020-12-25 DIAGNOSIS — Z79899 Other long term (current) drug therapy: Secondary | ICD-10-CM | POA: Diagnosis not present

## 2020-12-25 DIAGNOSIS — E43 Unspecified severe protein-calorie malnutrition: Secondary | ICD-10-CM | POA: Diagnosis present

## 2020-12-25 DIAGNOSIS — E876 Hypokalemia: Secondary | ICD-10-CM | POA: Diagnosis present

## 2020-12-25 DIAGNOSIS — M79606 Pain in leg, unspecified: Secondary | ICD-10-CM | POA: Diagnosis present

## 2020-12-25 LAB — CBC
HCT: 33.9 % — ABNORMAL LOW (ref 36.0–46.0)
Hemoglobin: 11.2 g/dL — ABNORMAL LOW (ref 12.0–15.0)
MCH: 32.6 pg (ref 26.0–34.0)
MCHC: 33 g/dL (ref 30.0–36.0)
MCV: 98.5 fL (ref 80.0–100.0)
Platelets: 55 10*3/uL — ABNORMAL LOW (ref 150–400)
RBC: 3.44 MIL/uL — ABNORMAL LOW (ref 3.87–5.11)
RDW: 17.5 % — ABNORMAL HIGH (ref 11.5–15.5)
WBC: 5.6 10*3/uL (ref 4.0–10.5)
nRBC: 0.4 % — ABNORMAL HIGH (ref 0.0–0.2)

## 2020-12-25 LAB — BASIC METABOLIC PANEL
Anion gap: 9 (ref 5–15)
BUN: 24 mg/dL — ABNORMAL HIGH (ref 8–23)
CO2: 27 mmol/L (ref 22–32)
Calcium: 8.2 mg/dL — ABNORMAL LOW (ref 8.9–10.3)
Chloride: 101 mmol/L (ref 98–111)
Creatinine, Ser: 0.51 mg/dL (ref 0.44–1.00)
GFR, Estimated: >60 mL/min (ref >60)
Glucose, Bld: 107 mg/dL — ABNORMAL HIGH (ref 70–99)
Potassium: 4.1 mmol/L (ref 3.5–5.1)
Sodium: 137 mmol/L (ref 135–145)

## 2020-12-25 LAB — TROPONIN I (HIGH SENSITIVITY): Troponin I (High Sensitivity): 160 ng/L (ref <18)

## 2020-12-25 LAB — VITAMIN B12: Vitamin B-12: 125 pg/mL — ABNORMAL LOW (ref 180–914)

## 2020-12-25 LAB — AMMONIA: Ammonia: 17 umol/L (ref 9–35)

## 2020-12-25 MED ORDER — KCL-LACTATED RINGERS 20 MEQ/L IV SOLN: INTRAVENOUS | Status: DC

## 2020-12-25 MED ORDER — CHLORHEXIDINE GLUCONATE CLOTH 2 % EX PADS
6.0000 | MEDICATED_PAD | Freq: Every day | CUTANEOUS | Status: DC
  Administered 2020-12-26 – 2020-12-29 (×4): 6 via TOPICAL

## 2020-12-25 MED ORDER — CYANOCOBALAMIN 1000 MCG/ML IJ SOLN
1000.0000 ug | Freq: Every day | INTRAMUSCULAR | Status: AC
  Administered 2020-12-25 – 2020-12-29 (×5): 1000 ug via INTRAMUSCULAR
  Filled 2020-12-25 (×5): qty 1

## 2020-12-25 MED ORDER — MAGNESIUM SULFATE 2 GM/50ML IV SOLN
2.0000 g | Freq: Once | INTRAVENOUS | Status: AC
  Administered 2020-12-25: 2 g via INTRAVENOUS
  Filled 2020-12-25: qty 50

## 2020-12-25 MED ORDER — POTASSIUM CHLORIDE 20 MEQ PO PACK
40.0000 meq | PACK | Freq: Once | ORAL | Status: AC
  Administered 2020-12-25: 40 meq via ORAL
  Filled 2020-12-25: qty 2

## 2020-12-25 MED ORDER — KCL-LACTATED RINGERS 20 MEQ/L IV SOLN
INTRAVENOUS | Status: DC
  Filled 2020-12-25 (×2): qty 1000

## 2020-12-25 MED ORDER — POTASSIUM CHLORIDE 2 MEQ/ML IV SOLN
INTRAVENOUS | Status: DC
  Filled 2020-12-25 (×16): qty 1000

## 2020-12-25 NOTE — ED Notes (Signed)
Pt repositioned for comfort

## 2020-12-25 NOTE — ED Notes (Signed)
Family at bedside and updated on POC  

## 2020-12-25 NOTE — ED Notes (Signed)
This RN at bedside. Pt unable to feed self at this time. Pt took a few bites of mashed potatoes with this RNs help. MD messaged for possible OT consult

## 2020-12-25 NOTE — Evaluation (Signed)
Physical Therapy Evaluation Patient Details Name: [name] MRN: [id number] DOB: February 02, 1934 Today's Date: 12/25/2020   History of Present Illness  Pt is an 87 y/o F admitted on 12/24/20 with c/c of progressive weakness over the last month -may be multifactorial in setting of hypokalemia and possible urinary tract infection with suprapubic tenderness and leukocytosis on UA. PMH: R breast CA stage Ia ER/PR positive, HER2 negative, stage 3a squamous cell carcinoma of L lung, HTN, CKD stage 3a, complete heart block s/p pacemaker, R mastectomy, paroxysmal a-fib not on anticoagulation, pulmonary HTN, stroke with no residual effects  Clinical Impression  Pt seen for PT evaluation with pt received in bed with covers over head & lights off with PT turning lights on & pt awakened. Pt demonstrates impaired cognition, providing inconsistent PLOF/home set up information so all information below obtained from chart. Pt requires max assist for supine<>sit & is unable to maintain sitting EOB 2/2 R/posterior lean with inability to support through RUE and shift trunk for upright posture with midline orientation. Assisted pt back to bed where pt requires +2 assist to scoot to [facility]. PT attempted to engage pt in BLE strengthening exercises but pt requires MAX multimodal cuing with little participation. Assisted pt with meal tray set up but pt appears to have decreased awareness of need/ability to feed self - nurse notified of need for assistance. Pt would benefit from ongoing PT services to address balance, endurance, bed mobility, transfers & gait as able.     Follow Up Recommendations SNF;Supervision/Assistance - 24 hour    Equipment Recommendations   (TBD in next venue)    Recommendations for Other Services       Precautions / Restrictions Precautions Precautions: Fall Restrictions Weight Bearing Restrictions: No      Mobility  Bed Mobility Overal bed mobility: Needs Assistance Bed Mobility: Supine  to Sit;Sit to Supine     Supine to sit: Max assist;HOB elevated Sit to supine: Max assist;HOB elevated        Transfers                    Ambulation/Gait                Stairs            Wheelchair Mobility    Modified Rankin (Stroke Patients Only)       Balance Overall balance assessment: Needs assistance Sitting-balance support: Feet unsupported;Bilateral upper extremity supported Sitting balance-Leahy Scale: Zero   Postural control: Right lateral lean;Posterior lean                                   Pertinent Vitals/Pain Pain Assessment: Faces Faces Pain Scale: Hurts a little bit Pain Location: generalized Pain Descriptors / Indicators: Grimacing Pain Intervention(s): Monitored during session;Limited activity within patient's tolerance;Repositioned    Home Living Family/patient expects to be discharged to:: Private residence Living Arrangements: Alone Available Help at Discharge: Family;Available PRN/intermittently;Other (Comment) Type of Home: Apartment Home Access: Stairs to enter (small threshold step)     Home Layout: One level Home Equipment: Grab bars - toilet;Grab bars - tub/shower;Tub bench;Walker - 2 wheels;Toilet riser;Bedside commode      Prior Function                 Hand Dominance        Extremity/Trunk Assessment   Upper Extremity Assessment Upper Extremity Assessment: Generalized weakness  Lower Extremity Assessment Lower Extremity Assessment: Generalized weakness    Cervical / Trunk Assessment Cervical / Trunk Assessment: Kyphotic  Communication   Communication: No difficulties  Cognition Arousal/Alertness: Awake/alert Behavior During Therapy: Flat affect Overall Cognitive Status: No family/caregiver present to determine baseline cognitive functioning                                 General Comments: AxO to self, decreased ability to follow 1 step commands with  extra time, sometimes does not respond to questions/conversation, requires multimodal cuing to participate in tasks      General Comments      Exercises General Exercises - Lower Extremity Heel Slides: AAROM;Strengthening;Both;10 reps;Supine   Assessment/Plan    PT Assessment Patient needs continued PT services  PT Problem List Decreased strength;Decreased mobility;Decreased safety awareness;Decreased activity tolerance;Decreased cognition;Decreased balance;Decreased knowledge of use of DME       PT Treatment Interventions DME instruction;Therapeutic exercise;Wheelchair mobility training;Gait training;Balance training;Stair training;Neuromuscular re-education;Modalities;Functional mobility training;Cognitive remediation;Therapeutic activities;Patient/family education;Manual techniques    PT Goals (Current goals can be found in the Care Plan section)  Acute Rehab PT Goals PT Goal Formulation: Patient unable to participate in goal setting Time For Goal Achievement: 01/08/21 Potential to Achieve Goals: Fair    Frequency Min 2X/week   Barriers to discharge Decreased caregiver support;Inaccessible home environment      Co-evaluation               AM-PAC PT "6 Clicks" Mobility  Outcome Measure Help needed turning from your back to your side while in a flat bed without using bedrails?: Total Help needed moving from lying on your back to sitting on the side of a flat bed without using bedrails?: Total Help needed moving to and from a bed to a chair (including a wheelchair)?: Total Help needed standing up from a chair using your arms (e.g., wheelchair or bedside chair)?: Total Help needed to walk in hospital room?: Total Help needed climbing 3-5 steps with a railing? : Total 6 Click Score: 6    End of Session   Activity Tolerance: Patient limited by fatigue Patient left: in bed (set up with meal tray, call bell in reach - notified nurse that pt will very likely need  assistance with consuming lunch) Nurse Communication: Mobility status (impaired cognition) PT Visit Diagnosis: Unsteadiness on feet (R26.81);Muscle weakness (generalized) (M62.81);Difficulty in walking, not elsewhere classified (R26.2)    Time: 1354-1410 PT Time Calculation (min) (ACUTE ONLY): 16 min   Charges:   PT Evaluation $PT Eval Moderate Complexity: 1 Mod PT Treatments $Therapeutic Activity: 8-22 mins        Lavone Nian, PT, DPT 12/25/20, 4:29 PM   [name] 12/25/2020, 4:27 PM

## 2020-12-25 NOTE — ED Notes (Signed)
Pt given meal tray.

## 2020-12-25 NOTE — Progress Notes (Addendum)
PROGRESS NOTE    [name]  SKA:[id number] DOB: 01-16-1934 DOA: 12/24/2020 PCP: [facility]   Brief Narrative: Taken from H&P. [name] is a 87 y.o. 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 Patient with history of going down the hill for some time now.  Worsening dementia, mostly oriented to self only.  Poor p.o. intake. She was on immune therapy with last dose on 12/07/2020 with Dr. Grayland Ormond, unable to do the third dose due to worsening weakness.  She was referred to see palliative care at cancer center which she has not done yet.  On arrival she was hemodynamically stable.  UA concerning for UTI with some leukocytes and bacteria, no leukocytosis, no fever, unable to explain any urinary symptoms, worsening chronic thrombocytopenia.  Discussed with niece and according to her she is going down the hill.  She lives at her house with some family members staying with her and taking turns to take care of her.  We discussed about CODE STATUS and she will discuss with other family members before making any decisions.  She does understand that her overall prognosis is very poor.  Bilateral lower extremity venous Doppler was done for some concern of leg pain and it was negative for DVT.  Patient with poor prognosis. We will call cancer center palliative care tomorrow. Sent a message to Dr. Grayland Ormond.  Subjective: Patient was seen and examined  today.  Appears very lethargic.  She was oriented to name only.  No know why she is here.  Unable to explain any symptoms.  Assessment & Plan:   Principal Problem:   Weakness Active Problems:   Pacemaker-Medtronic   History of CVA (cerebrovascular accident)   Essential hypertension   Hyperlipidemia   Elevated troponin   Loss of weight   Hypokalemia   Malignant neoplasm of upper-outer quadrant of right breast in female, estrogen receptor positive (HCC)   Squamous cell carcinoma of right lung (HCC)   CAD (coronary artery disease)   Protein-calorie malnutrition, severe   NSTEMI (non-ST elevated myocardial infarction) ([facility])  Generalized weakness/failure to thrive.  Multifactorial with her advanced malignancy, advanced dementia and a possible UTI. -Continue with ceftriaxone -Follow-up urine cultures -Will involve palliative care from cancer center tomorrow. -Asked Dr. Grayland Ormond about his recommendations whether to continue with immunotherapy or not or she is hospice appropriate. -We will ask PT evaluation in case she need a placement.  Hypokalemia.  Resolved.  Magnesium at 1.7. -Monitor electrolytes and replete as needed.  Elevated troponin.  Most likely secondary to demand ischemia.  Mild worsening with a flat curve.  B12 deficiency.  B12 low at 125. -Start her on IM B12 replacement.  Thrombocytopenia.  Seems chronic, patient is on immune therapy.  Platelet at 55 today, no active bleeding. -Oncology on board  Hypertension.  Currently blood pressure on softer side. -Keep holding home diltiazem.  Paroxysmal atrial fibrillation.  Rate well controlled.  Not on any anticoagulation at home.  Hyperlipidemia. -Continue statin   Right breast cancer Squamous cell carcinoma of the right lung.  Currently on immune therapy. -Oncology will decide about the further plan.  Objective: Vitals:   12/25/20 1136 12/25/20 1200 12/25/20 1230 12/25/20 1245  BP: 100/62 115/62  98/63  Pulse:  60 (!) 58 (!) 59 (!) 59  Resp: 17   16  Temp:      TempSrc:      SpO2: 96% 96% 98% 97%  Weight:      Height:        Intake/Output Summary (Last 24 hours) at 12/25/2020 1304 Last data filed at 12/25/2020 1136 Gross per 24 hour  Intake 1000 ml  Output --  Net 1000 ml   Filed Weights   12/24/20 1507  Weight: 47.6 kg    Examination:  General exam: Chronically ill-appearing, lethargic elderly lady. Respiratory system: Clear to auscultation. Respiratory effort normal. Cardiovascular system: S1 & S2 heard, RRR.  Gastrointestinal system: Soft, nontender, nondistended, bowel sounds positive. Central nervous system: Alert and oriented to self only. No focal neurological deficits. Extremities: No edema, no cyanosis, pulses intact and symmetrical. Psychiatry: Judgement and insight appear impaired.  DVT prophylaxis: Lovenox Code Status: Full Family Communication: Discussed with niece, we discussed about CODE STATUS and she will discuss with other family members.  Currently full code. Disposition Plan:  Status is: Inpatient  Remains inpatient appropriate because:Inpatient level of care appropriate due to severity of illness  Dispo: The patient is from: Home              Anticipated d/c is to: Home              Patient currently is not medically stable to d/c.   Difficult to place patient No             Level of care: Med-Surg  All the records are reviewed and case discussed with Care Management/Social Worker. Management plans discussed with the patient, nursing and they are in agreement.  Consultants:  Oncology Palliative care  Procedures:  Antimicrobials:  Ceftriaxone  Data Reviewed: I have personally reviewed following labs and imaging studies  CBC: Recent Labs  Lab 12/24/20 1510 12/25/20 0535  WBC 6.3 5.6  HGB 13.0 11.2*  HCT 38.2 33.9*  MCV 97.2 98.5  PLT PLATELET CLUMPS NOTED ON SMEAR, UNABLE TO ESTIMATE 55*   Basic Metabolic Panel: Recent Labs  Lab  12/24/20 1510 12/24/20 2033 12/25/20 0535  NA 135 137 137  K 3.2* 3.0* 4.1  CL 97* 99 101  CO2 _0 GLUCOSE 151* 106* 107*  BUN 35* 32* 24*  CREATININE 0.78 0.69 0.51  CALCIUM 8.6* 8.5* 8.2*  MG 1.7  --   --    GFR: Estimated Creatinine Clearance: 38.6 mL/min (by C-G formula based on SCr of 0.51 mg/dL). Liver Function Tests: No results for input(s): AST, ALT, ALKPHOS, BILITOT, PROT, ALBUMIN in the last 168 hours. No results for input(s): LIPASE, AMYLASE in the last 168 hours. Recent Labs  Lab 12/24/20 2331  AMMONIA 17   Coagulation Profile: No results for input(s): INR, PROTIME in the last 168 hours. Cardiac Enzymes: No results for input(s): CKTOTAL, CKMB, CKMBINDEX, TROPONINI in the last 168 hours. BNP (last 3 results) No results for input(s): PROBNP in the last 8760 hours. HbA1C: No results for input(s): HGBA1C in the last 72 hours. CBG: Recent Labs  Lab 12/24/20 2046  GLUCAP 103*   Lipid Profile: No results for input(s): CHOL, HDL, LDLCALC, TRIG, CHOLHDL, LDLDIRECT in the last 72 hours. Thyroid Function Tests: Recent Labs  12/24/20 1510  TSH 1.012   Anemia Panel: Recent Labs    12/24/20 2331  VITAMINB12 125*   Sepsis Labs: No results for input(s): PROCALCITON, LATICACIDVEN in the last 168 hours.  Recent Results (from the past 240 hour(s))  Resp Panel by RT-PCR (Flu A&B, Covid) Nasopharyngeal Swab     Status: None   Collection Time: 12/24/20  4:57 PM   Specimen: Nasopharyngeal Swab; Nasopharyngeal(NP) swabs in vial transport medium  Result Value Ref Range Status   SARS Coronavirus 2 by RT PCR NEGATIVE NEGATIVE Final    Comment: (NOTE) SARS-CoV-2 target nucleic acids are NOT DETECTED.  The SARS-CoV-2 RNA is generally detectable in upper respiratory specimens during the acute phase of infection. The lowest concentration of SARS-CoV-2 viral copies this assay can detect is 138 copies/mL. A negative result does not preclude SARS-Cov-2 infection  and should not be used as the sole basis for treatment or other patient management decisions. A negative result may occur with  improper specimen collection/handling, submission of specimen other than nasopharyngeal swab, presence of viral mutation(s) within the areas targeted by this assay, and inadequate number of viral copies(<138 copies/mL). A negative result must be combined with clinical observations, patient history, and epidemiological information. The expected result is Negative.  Fact Sheet for Patients:  BloggerCourse.com  Fact Sheet for Healthcare Providers:  SeriousBroker.it  This test is no t yet approved or cleared by the [place] FDA and  has been authorized for detection and/or diagnosis of SARS-CoV-2 by FDA under an Emergency Use Authorization (EUA). This EUA will remain  in effect (meaning this test can be used) for the duration of the COVID-19 declaration under Section 564(b)(1) of the Act, 21 U.S.C.section 360bbb-3(b)(1), unless the authorization is terminated  or revoked sooner.       Influenza A by PCR NEGATIVE NEGATIVE Final   Influenza B by PCR NEGATIVE NEGATIVE Final    Comment: (NOTE) The Xpert Xpress SARS-CoV-2/FLU/RSV plus assay is intended as an aid in the diagnosis of influenza from Nasopharyngeal swab specimens and should not be used as a sole basis for treatment. Nasal washings and aspirates are unacceptable for Xpert Xpress SARS-CoV-2/FLU/RSV testing.  Fact Sheet for Patients: BloggerCourse.com  Fact Sheet for Healthcare Providers: SeriousBroker.it  This test is not yet approved or cleared by the [place] FDA and has been authorized for detection and/or diagnosis of SARS-CoV-2 by FDA under an Emergency Use Authorization (EUA). This EUA will remain in effect (meaning this test can be used) for the duration of the COVID-19 declaration  under Section 564(b)(1) of the Act, 21 U.S.C. section 360bbb-3(b)(1), unless the authorization is terminated or revoked.  Performed at [facility], [address] [place], [place] [address]      Radiology Studies: DG Chest 2 View  Result Date: 12/24/2020 CLINICAL DATA:  History of lung cancer.  Increased weakness. EXAM: CHEST - 2 VIEW COMPARISON:  X-ray dated 11/07/2020. FINDINGS: Heart size and mediastinal contours are stable. LEFT chest wall pacemaker/ICD apparatus appears grossly stable in position. RIGHT chest wall Port-A-Cath appears grossly stable in position with tip at the level of the mid/lower SVC. RIGHT basilar atelectasis, similar to earlier exams. Additional chronic scarring/atelectasis at the LEFT lung base. Study is hypoinspiratory. No confluent opacity to suggest a developing pneumonia. No pleural effusion or pneumothorax is seen. Osseous structures about the chest are unremarkable. IMPRESSION: Low lung volumes. No acute findings. No evidence of pneumonia or alveolar pulmonary edema. Electronically Signed   By: Weyman Croon  Enriqueta Shutter M.D.   On: 12/24/2020 16:17   CT HEAD WO CONTRAST (5MM)  Result Date: 12/24/2020 CLINICAL DATA:  Altered mental status. EXAM: CT HEAD WITHOUT CONTRAST TECHNIQUE: Contiguous axial images were obtained from the base of the skull through the vertex without intravenous contrast. COMPARISON:  October 15, 2020. FINDINGS: Brain: Mild diffuse cortical atrophy is noted. Mild chronic ischemic white matter disease is noted. No mass effect or midline shift is noted. Ventricular size is within normal limits. There is no evidence of mass lesion, hemorrhage or acute infarction. Vascular: No hyperdense vessel or unexpected calcification. Skull: Normal. Negative for fracture or focal lesion. Sinuses/Orbits: No acute finding. Other: None. IMPRESSION: No acute intracranial abnormality seen. Electronically Signed   By: Marijo Conception M.D.   On: 12/24/2020 16:21   US  Venous Img Lower Bilateral (DVT)  Result Date: 12/24/2020 CLINICAL DATA:  Bilateral leg pain. EXAM: BILATERAL LOWER EXTREMITY VENOUS DOPPLER ULTRASOUND TECHNIQUE: Gray-scale sonography with compression, as well as color and duplex ultrasound, were performed to evaluate the deep venous system(s) from the level of the common femoral vein through the popliteal and proximal calf veins. COMPARISON:  None. FINDINGS: VENOUS Normal compressibility of the BILATERAL common femoral, superficial femoral, and popliteal veins, as well as the visualized calf veins. THE RIGHT posterior tibial and peroneal veins are poorly visualized. Visualized portions of the BILATERAL profunda femoral veins and BILATERAL great saphenous veins are unremarkable. No filling defects to suggest DVT on grayscale or color Doppler imaging. Doppler waveforms show normal direction of venous flow, normal respiratory plasticity and response to augmentation. Limited views of the contralateral common femoral vein are unremarkable. OTHER None. Limitations: none IMPRESSION: No evidence of DVT within the RIGHT or LEFT lower extremity. Electronically Signed   By: Virgina Norfolk M.D.   On: 12/24/2020 23:57    Scheduled Meds:  atorvastatin  20 mg Oral QHS   clopidogrel  75 mg Oral Daily   cyanocobalamin  1,000 mcg Intramuscular Daily   enoxaparin (LOVENOX) injection  40 mg Subcutaneous Q24H   feeding supplement  237 mL Oral TID BM   multivitamin with minerals  1 tablet Oral Daily   Continuous Infusions:  cefTRIAXone (ROCEPHIN)  IV Stopped (12/24/20 2330)   lactated ringers with kcl 100 mL/hr at 12/25/20 1136     LOS: 0 days   Time spent: 40 minutes More than 50% of the time was spent in counseling/coordination of care  Lorella Nimrod, MD [facility]  If 7PM-7AM, please contact night-coverage [email or web address]  12/25/2020, 1:04 PM   This record has been created using Systems analyst. Errors have been sought and  corrected,but may not always be located. Such creation errors do not reflect on the standard of care.

## 2020-12-26 ENCOUNTER — Encounter: Payer: Self-pay | Admitting: Internal Medicine

## 2020-12-26 ENCOUNTER — Inpatient Hospital Stay: Payer: Medicare HMO

## 2020-12-26 DIAGNOSIS — D696 Thrombocytopenia, unspecified: Secondary | ICD-10-CM

## 2020-12-26 DIAGNOSIS — C3490 Malignant neoplasm of unspecified part of unspecified bronchus or lung: Secondary | ICD-10-CM | POA: Diagnosis not present

## 2020-12-26 DIAGNOSIS — R4182 Altered mental status, unspecified: Secondary | ICD-10-CM

## 2020-12-26 DIAGNOSIS — R531 Weakness: Principal | ICD-10-CM

## 2020-12-26 DIAGNOSIS — D649 Anemia, unspecified: Secondary | ICD-10-CM

## 2020-12-26 DIAGNOSIS — Z515 Encounter for palliative care: Secondary | ICD-10-CM

## 2020-12-26 LAB — BASIC METABOLIC PANEL
Anion gap: 8 (ref 5–15)
BUN: 17 mg/dL (ref 8–23)
CO2: 26 mmol/L (ref 22–32)
Calcium: 8.2 mg/dL — ABNORMAL LOW (ref 8.9–10.3)
Chloride: 101 mmol/L (ref 98–111)
Creatinine, Ser: 0.45 mg/dL (ref 0.44–1.00)
GFR, Estimated: >60 mL/min (ref >60)
Glucose, Bld: 75 mg/dL (ref 70–99)
Potassium: 3.8 mmol/L (ref 3.5–5.1)
Sodium: 135 mmol/L (ref 135–145)

## 2020-12-26 LAB — CBC
HCT: 31.3 % — ABNORMAL LOW (ref 36.0–46.0)
Hemoglobin: 10 g/dL — ABNORMAL LOW (ref 12.0–15.0)
MCH: 31.7 pg (ref 26.0–34.0)
MCHC: 31.9 g/dL (ref 30.0–36.0)
MCV: 99.4 fL (ref 80.0–100.0)
Platelets: 54 10*3/uL — ABNORMAL LOW (ref 150–400)
RBC: 3.15 MIL/uL — ABNORMAL LOW (ref 3.87–5.11)
RDW: 17.5 % — ABNORMAL HIGH (ref 11.5–15.5)
WBC: 4.3 10*3/uL (ref 4.0–10.5)
nRBC: 0 % (ref 0.0–0.2)

## 2020-12-26 LAB — MAGNESIUM: Magnesium: 1.7 mg/dL (ref 1.7–2.4)

## 2020-12-26 LAB — PHOSPHORUS: Phosphorus: 2.4 mg/dL — ABNORMAL LOW (ref 2.5–4.6)

## 2020-12-26 MED ORDER — IOHEXOL 350 MG/ML SOLN
75.0000 mL | Freq: Once | INTRAVENOUS | Status: AC | PRN
  Administered 2020-12-26: 75 mL via INTRAVENOUS

## 2020-12-26 MED ORDER — SODIUM CHLORIDE 0.9 % IV SOLN: Freq: Once | INTRAVENOUS | Status: AC

## 2020-12-26 MED ORDER — MAGNESIUM SULFATE 2 GM/50ML IV SOLN
2.0000 g | Freq: Once | INTRAVENOUS | Status: AC
  Administered 2020-12-26: 2 g via INTRAVENOUS
  Filled 2020-12-26: qty 50

## 2020-12-26 MED ORDER — SODIUM PHOSPHATES 45 MMOLE/15ML IV SOLN
20.0000 mmol | Freq: Once | INTRAVENOUS | Status: AC
  Administered 2020-12-26: 18:00:00 20 mmol via INTRAVENOUS
  Filled 2020-12-26: qty 6.67

## 2020-12-26 NOTE — Progress Notes (Signed)
PROGRESS NOTE    [name]  QIH:[id number] DOB: [birth year] DOA: 12/24/2020 PCP: [facility], [facility]   Brief Narrative: Taken from H&P. [name] is a 87 y.o. 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 Patient with history of going down the hill for some time now.  Worsening dementia, mostly oriented to self only.  Poor p.o. intake. She was on immune therapy with last dose on 12/07/2020 with Dr. Grayland Ormond, unable to do the third dose due to worsening weakness.  She was referred to see palliative care at cancer center which she has not done yet.  On arrival she was hemodynamically stable.  UA concerning for UTI with some leukocytes and bacteria, no leukocytosis, no fever, unable to explain any urinary symptoms, worsening chronic thrombocytopenia.  Discussed with niece and according to her she is going down the hill.  She lives at her house with some family members staying with her and taking turns to take care of her.   She does understand that her overall prognosis is very poor.  Palliative care was also consulted and she was made DNR with current level of care. Oncology also ordered repeat CT chest and a possible repeat CT brain with contrast for restaging.  Bilateral lower extremity venous Doppler was done for some concern of leg pain and it was negative for DVT.  Patient with poor prognosis.  Subjective: Patient was little more alert and able  to answer some simple questions.  Appears quite lethargic.  Appetite and p.o. intake remains very poor.  Assessment & Plan:   Principal Problem:   Weakness Active Problems:   Pacemaker-Medtronic   History of CVA (cerebrovascular accident)   Essential hypertension   Hyperlipidemia   Elevated troponin   Loss of weight   Hypokalemia   Malignant neoplasm of upper-outer quadrant of right breast in female, estrogen receptor positive (HCC)   Squamous cell carcinoma of right lung (HCC)   CAD (coronary artery disease)   Protein-calorie malnutrition, severe   NSTEMI (non-ST elevated myocardial infarction) [facility])   Palliative care encounter  Generalized weakness/failure to thrive.  Multifactorial with her advanced malignancy, advanced dementia and a possible UTI.  Initial urine cultures with Enterococcus faecium, pending susceptibility. Palliative care from cancer center was consulted and patient was made DNR. We will repeat imaging to see any disease progression. -Continue with ceftriaxone -PT is recommending SNF  Hypokalemia.  Resolved.  Magnesium at 1.7. -Monitor electrolytes and replete as needed.  Hypophosphatemia.  Mildly low phosphorous. -Replete phosphorus and monitor  Elevated troponin.  Most likely secondary to demand ischemia.  Mild worsening with a flat curve.  B12 deficiency.  B12 low at 125. -Start her on IM B12 replacement.  Thrombocytopenia.  Seems chronic, patient is on immune therapy.  Platelet at 54 today, no active bleeding.  Per oncology note immunotherapy should not affect the cell counts. -Oncology on board  Hypertension.  Currently blood pressure within goal. -Keep holding home diltiazem.  Paroxysmal atrial fibrillation.  Rate well controlled.  Not on any anticoagulation at home.  Hyperlipidemia. -  Continue statin   Right breast cancer Squamous cell carcinoma of the right lung.  Currently on immune therapy. -Oncology will decide about the further  plan.  Objective: Vitals:   12/26/20 0024 12/26/20 0500 12/26/20 0900 12/26/20 1122  BP: 114/64 110/64 111/62 (!) 100/48  Pulse: (!) 59 60 60 60  Resp: _0 Temp: 98.3 F (36.8 C) 99.1 F (37.3 C) 98.8 F (37.1 C) 98.3 F (36.8 C)  TempSrc: Oral Oral Oral Oral  SpO2: 98% 97% 98% 100%  Weight:      Height:       No intake or output data in the 24 hours ending 12/26/20 1520  Filed Weights   12/24/20 1507 12/25/20 2125  Weight: 47.6 kg 59.4 kg    Examination:  General.  Lethargic elderly lady, in no acute distress. Pulmonary.  Lungs clear bilaterally, normal respiratory effort. CV.  Regular rate and rhythm, no JVD, rub or murmur. Abdomen.  Soft, nontender, nondistended, BS positive. CNS.  Alert and oriented .  No focal neurologic deficit. Extremities.  No edema, no cyanosis, pulses intact and symmetrical. Psychiatry.  Judgment and insight appears impaired.  DVT prophylaxis: Lovenox Code Status: Full Family Communication:  Disposition Plan:  Status is: Inpatient  Remains inpatient appropriate because:Inpatient level of care appropriate due to severity of illness  Dispo: The patient is from: Home              Anticipated d/c is to: Home              Patient currently is not medically stable to d/c.   Difficult to place patient No             Level of care: Med-Surg  All the records are reviewed and case discussed with Care Management/Social Worker. Management plans discussed with the patient, nursing and they are in agreement.  Consultants:  Oncology Palliative care  Procedures:  Antimicrobials:  Ceftriaxone  Data Reviewed: I have personally reviewed following labs and imaging studies  CBC: Recent Labs  Lab 12/24/20 1510 12/25/20 0535 12/26/20 0421  WBC 6.3 5.6 4.3  HGB 13.0 11.2* 10.0*  HCT 38.2 33.9* 31.3*  MCV 97.2 98.5 99.4  PLT PLATELET CLUMPS NOTED ON SMEAR, UNABLE TO ESTIMATE 55* 54*    Basic Metabolic Panel: Recent Labs  Lab  12/24/20 1510 12/24/20 2033 12/25/20 0535 12/26/20 0421  NA 135 137 137 135  K 3.2* 3.0* 4.1 3.8  CL 97* 99 101 101  CO2 _1 GLUCOSE 151* 106* 107* 75  BUN 35* 32* 24* 17  CREATININE 0.78 0.69 0.51 0.45  CALCIUM 8.6* 8.5* 8.2* 8.2*  MG 1.7  --   --  1.7  PHOS  --   --   --  2.4*    GFR: Estimated Creatinine Clearance: 44.4 mL/min (by C-G formula based on SCr of 0.45 mg/dL). Liver Function Tests: No results for input(s): AST, ALT, ALKPHOS, BILITOT, PROT, ALBUMIN in the last 168 hours. No results for input(s): LIPASE, AMYLASE in the last 168 hours. Recent Labs  Lab 12/24/20 2331  AMMONIA 17    Coagulation Profile: No results for input(s): INR, PROTIME in the last 168 hours. Cardiac Enzymes: No results for input(s): CKTOTAL, CKMB, CKMBINDEX, TROPONINI in the last 168 hours. BNP (last 3 results) No results for input(s): PROBNP in the last 8760 hours. HbA1C: No results for input(s): HGBA1C in the last 72 hours. CBG: Recent Labs  Lab 12/24/20 2046  GLUCAP 103*    Lipid Profile: No results for input(s): CHOL, HDL, LDLCALC, TRIG, CHOLHDL, LDLDIRECT in the last 72 hours. Thyroid Function Tests: Recent Labs    12/24/20 1510  TSH 1.012    Anemia Panel: Recent Labs    12/24/20 2331  VITAMINB12 125*    Sepsis Labs: No results for input(s): PROCALCITON, LATICACIDVEN in the last 168 hours.  Recent Results (from the past 240 hour(s))  Resp Panel by RT-PCR (Flu A&B, Covid) Nasopharyngeal Swab     Status: None   Collection Time: 12/24/20  4:57 PM   Specimen: Nasopharyngeal Swab; Nasopharyngeal(NP) swabs in vial transport medium  Result Value Ref Range Status   SARS Coronavirus 2 by RT PCR NEGATIVE NEGATIVE Final    Comment: (NOTE) SARS-CoV-2 target nucleic acids are NOT DETECTED.  The SARS-CoV-2 RNA is generally detectable in upper respiratory specimens during the acute phase of infection. The lowest concentration of SARS-CoV-2 viral copies this assay  can detect is 138 copies/mL. A negative result does not preclude SARS-Cov-2 infection and should not be used as the sole basis for treatment or other patient management decisions. A negative result may occur with  improper specimen collection/handling, submission of specimen other than nasopharyngeal swab, presence of viral mutation(s) within the areas targeted by this assay, and inadequate number of viral copies(<138 copies/mL). A negative result must be combined with clinical observations, patient history, and epidemiological information. The expected result is Negative.  Fact Sheet for Patients:  EntrepreneurPulse.com.au  Fact Sheet for Healthcare Providers:  IncredibleEmployment.be  This test is no t yet approved or cleared by the [place] FDA and  has been authorized for detection and/or diagnosis of SARS-CoV-2 by FDA under an Emergency Use Authorization (EUA). This EUA will remain  in effect (meaning this test can be used) for the duration of the COVID-19 declaration under Section 564(b)(1) of the Act, 21 U.S.C.section 360bbb-3(b)(1), unless the authorization is terminated  or revoked sooner.       Influenza A by PCR NEGATIVE NEGATIVE Final   Influenza B by PCR NEGATIVE NEGATIVE Final    Comment: (NOTE) The Xpert Xpress SARS-CoV-2/FLU/RSV plus assay is intended as an aid in the diagnosis of influenza from Nasopharyngeal swab specimens and should not be used as a sole basis for treatment. Nasal washings and aspirates are unacceptable for Xpert Xpress SARS-CoV-2/FLU/RSV testing.  Fact Sheet for Patients: EntrepreneurPulse.com.au  Fact Sheet for Healthcare Providers: IncredibleEmployment.be  This test is not yet approved or cleared by the [place] FDA and has been authorized for detection and/or diagnosis of SARS-CoV-2 by FDA under an Emergency Use Authorization (EUA). This EUA will  remain in effect (meaning this test can be used) for the duration of the COVID-19 declaration under Section 564(b)(1) of the Act, 21 U.S.C. section 360bbb-3(b)(1), unless the authorization is terminated or revoked.  Performed at [facility], [address] [place], [place] [address]   Urine Culture     Status: Abnormal (Preliminary result)   Collection Time: 12/24/20  5:50 PM   Specimen: Urine, Random  Result Value Ref Range Status   Specimen Description   Final    URINE, RANDOM Performed at [facility], [address], [place], [place] [address]    Special Requests   Final    NONE Performed at [facility], [address] [place], [place] [address]    Culture (A)  Final    30,000 COLONIES/mL ENTEROCOCCUS FAECIUM CULTURE REINCUBATED FOR BETTER GROWTH Performed at [facility]  [facility], [place] [address] [place], [place] [address]    Report Status PENDING  Incomplete      Radiology Studies: DG Chest 2 View  Result Date: 12/24/2020 CLINICAL DATA:  History of lung cancer.  Increased weakness. EXAM: CHEST - 2 VIEW COMPARISON:  X-ray dated 11/07/2020. FINDINGS: Heart size and mediastinal contours are stable. LEFT chest wall pacemaker/ICD apparatus appears grossly stable in position. RIGHT chest wall Port-A-Cath appears grossly stable in position with tip at the level of the mid/lower SVC. RIGHT basilar atelectasis, similar to earlier exams. Additional chronic scarring/atelectasis at the LEFT lung base. Study is hypoinspiratory. No confluent opacity to suggest a developing pneumonia. No pleural effusion or pneumothorax is seen. Osseous structures about the chest are unremarkable. IMPRESSION: Low lung volumes. No acute findings. No evidence of pneumonia or alveolar pulmonary edema. Electronically Signed   By: Franki Cabot M.D.   On: 12/24/2020 16:17   CT HEAD WO CONTRAST (5MM)  Result Date: 12/24/2020 CLINICAL DATA:  Altered mental status. EXAM: CT HEAD  WITHOUT CONTRAST TECHNIQUE: Contiguous axial images were obtained from the base of the skull through the vertex without intravenous contrast. COMPARISON:  October 15, 2020. FINDINGS: Brain: Mild diffuse cortical atrophy is noted. Mild chronic ischemic white matter disease is noted. No mass effect or midline shift is noted. Ventricular size is within normal limits. There is no evidence of mass lesion, hemorrhage or acute infarction. Vascular: No hyperdense vessel or unexpected calcification. Skull: Normal. Negative for fracture or focal lesion. Sinuses/Orbits: No acute finding. Other: None. IMPRESSION: No acute intracranial abnormality seen. Electronically Signed   By: Marijo Conception M.D.   On: 12/24/2020 16:21   US Venous Img Lower Bilateral (DVT)  Result Date: 12/24/2020 CLINICAL DATA:  Bilateral leg pain. EXAM: BILATERAL LOWER EXTREMITY VENOUS DOPPLER ULTRASOUND TECHNIQUE: Gray-scale sonography with compression, as well as color and duplex ultrasound, were performed to evaluate the deep venous system(s) from the level of the common femoral vein through the popliteal and proximal calf veins. COMPARISON:  None. FINDINGS: VENOUS Normal compressibility of the BILATERAL common femoral, superficial femoral, and popliteal veins, as well as the visualized calf veins. THE RIGHT posterior tibial and peroneal veins are poorly visualized. Visualized portions of the BILATERAL profunda femoral veins and BILATERAL great saphenous veins are unremarkable. No filling defects to suggest DVT on grayscale or color Doppler imaging. Doppler waveforms show normal direction of venous flow, normal respiratory plasticity and response to augmentation. Limited views of the contralateral common femoral vein are unremarkable. OTHER None. Limitations: none IMPRESSION: No evidence of DVT within the RIGHT or LEFT lower extremity. Electronically Signed   By: Virgina Norfolk M.D.   On: 12/24/2020 23:57    Scheduled Meds:  atorvastatin  20 mg  Oral QHS   Chlorhexidine Gluconate Cloth  6 each Topical Daily   clopidogrel  75 mg Oral Daily   cyanocobalamin  1,000 mcg Intramuscular Daily   enoxaparin (LOVENOX) injection  40 mg Subcutaneous Q24H   feeding supplement  237 mL Oral TID BM   multivitamin with minerals  1 tablet Oral Daily   Continuous Infusions:  cefTRIAXone (ROCEPHIN)  IV 1 g (12/26/20 0940)   lactated ringers with kcl 100 mL/hr at 12/26/20 1518     LOS: 1 day   Time spent: 36 minutes More than 50% of the time was spent in counseling/coordination of care  Lorella Nimrod, MD [facility]  If 7PM-7AM, please contact night-coverage [email or web address]  12/26/2020, 3:20 PM  This record has been created using Systems analyst. Errors have been sought and corrected,but may not always be located. Such creation errors do not reflect on the standard of care.

## 2020-12-26 NOTE — Progress Notes (Signed)
[place]  [phone] [phone]   Name: [name] Date: 12/26/2020 MRN: [id number]  DOB: 04/24/1934  Patient Care Team: Practice, [facility] as PCP - General Suzanne Sprang, MD as PCP - Cardiology (Cardiology) Suzanne Demark, RN as Registered Nurse Suzanne Sprang, MD as Consulting Physician (Cardiology)    REASON FOR CONSULTATION: [name] is a 87 y.o. 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  Patient was hospitalized 10/15/2020-10/22/2020 with weakness thought secondary to UTI.  She discharged to SNF but family quickly brought her home due to poor care.  Patient was hospitalized again 11/07/2020-11/09/2020 with elevated troponins thought due to demand ischemia after presenting to the ER with cough and weakness.  Patient is now readmitted 12/24/2020 again with weakness and possible UTI.  Palliative care is consulted to address goals.  SOCIAL HISTORY:     reports that she quit smoking about 14 years ago. Her smoking use included cigarettes. She has never used smokeless tobacco. She reports that she does not drink alcohol and does not use drugs.  Patient lives at home with her family providing 24/7 care.  Patient has a son and daughter.  Patient previously worked in [place]:  On file  CODE STATUS: DNR  PAST MEDICAL HISTORY: Past Medical History:  Diagnosis Date   Breast cancer, right ([place]) 11/2018   Mastectomy   C. difficile colitis    CKD (chronic kidney disease), stage III ([place])    Complete heart block ([place])    a. Originally placed 08/2004; b. 07/2013 s/p MDT Protection PPM (ser # [id number]).    Dyspnea    Gait abnormality 07/23/2016   GERD (gastroesophageal reflux disease)    Gout    Hemoptysis 07/25/2020   History of hypertension    History of stroke    Hyperhomocysteinemia (HCC)    Hyperlipidemia    Hypertension    Hypokalemia 07/13/2018   Low blood magnesium 07/13/2018   Lung cancer ([place])    Pacemaker-Medtronic 08/07/2011   PAF (paroxysmal atrial fibrillation) ([place])    a. 07/2020 Device interrogation-->13.3% AF burden-->longest atrial high rate episode 24:26 mins-->AT vs AF. Not on [place].   Pulmonary hypertension ([place])    a. 06/2018 Echo: EF 60-65%, no rwma, nl RV fxn, RVSP 48.38mHg. Mild AoV Ca2+.   Rhabdomyolysis    Stroke ([facility]    left sided weakness, 07/28/20- no residual    Vomiting 07/13/2018    PAST SURGICAL HISTORY:  Past Surgical History:  Procedure Laterality Date   BIOPSY  07/15/2018   Procedure: BIOPSY;  Surgeon: GGatha Mayer MD;  Location: [facility] ENDOSCOPY;  Service: Gastroenterology;;   BREAST BIOPSY Right 10/27/2018   affirm bx of mass, x clip,  INVASIVE MAMMARY CARCINOMA   BREAST LUMPECTOMY Right 12/03/2018   BRONCHIAL BIOPSY  07/29/2020   Procedure: BRONCHIAL BIOPSIES;  Surgeon: OLaurin Coder MD;  [facility]: [facility]  Service: Pulmonary;;   BRONCHIAL NEEDLE ASPIRATION BIOPSY  07/29/2020   Procedure: BRONCHIAL NEEDLE ASPIRATION BIOPSIES;  Surgeon: OLaurin Coder MD;  [facility];  Service: Pulmonary;;   CARDIAC CATHETERIZATION     ENDOBRONCHIAL ULTRASOUND N/A 07/29/2020   Procedure: ENDOBRONCHIAL ULTRASOUND;  Surgeon: OSherrilyn Rist  A, MD;  Location: [facility]COPY;  Service: Pulmonary;  Laterality: N/A;   ESOPHAGOGASTRODUODENOSCOPY Left 07/15/2018   Procedure: ESOPHAGOGASTRODUODENOSCOPY (EGD);  Surgeon: [name], MD;  Location: [facility];  Service: Gastroenterology;  Laterality: Left;   HEMOSTASIS CONTROL  07/29/2020   Procedure: HEMOSTASIS CONTROL;  Surgeon: Laurin Coder, MD;  Location: [facility]OSCOPY;  Service:  Pulmonary;;   PACEMAKER INSERTION     Medtronic Enpulse dual-chamber pacemaker   PARTIAL MASTECTOMY WITH NEEDLE LOCALIZATION AND AXILLARY SENTINEL LYMPH NODE BX Right 12/03/2018   Procedure: PARTIAL MASTECTOMY WITH NEEDLE LOCALIZATION AND AXILLARY SENTINEL LYMPH NODE BX, RIGHT;  Surgeon: [name], MD;  Location: [facility] ORS;  Service: General;  Laterality: Right;   PERMANENT PACEMAKER GENERATOR CHANGE N/A 07/30/2013   Procedure: PERMANENT PACEMAKER GENERATOR CHANGE;  Surgeon: Suzanne Sprang, MD;  Location: [facility] CATH LAB;  Service: Cardiovascular;  Laterality: N/A;   PORTA CATH INSERTION N/A 08/16/2020   Procedure: PORTA CATH INSERTION;  Surgeon: Katha Cabal, MD;  Location: [facility] CV LAB;  Service: Cardiovascular;  Laterality: N/A;   VIDEO BRONCHOSCOPY N/A 07/29/2020   Procedure: VIDEO BRONCHOSCOPY WITH FLUORO;  Surgeon: Laurin Coder, MD;  Location: [facility] ENDOSCOPY;  Service: Pulmonary;  Laterality: N/A;    HEMATOLOGY/ONCOLOGY HISTORY:  Oncology History  Malignant neoplasm of upper-outer quadrant of right breast in female, estrogen receptor positive ([place])  10/31/2018 Initial Diagnosis   Malignant neoplasm of upper-outer quadrant of right breast in female, estrogen receptor positive ([place])   11/06/2018 Cancer Staging   Staging form: Breast, AJCC 8th Edition - Clinical stage from 11/06/2018: Stage IA (cT1a, cN0, cM0, G1, ER+, PR+, HER2-) - Signed by [name], MD on 11/06/2018   Squamous cell carcinoma of right lung ([place])  08/05/2020 Initial Diagnosis   Squamous cell carcinoma of right lung ([place])   08/05/2020 Cancer Staging   Staging form: Lung, AJCC 8th Edition - Pathologic stage from 08/05/2020: Stage IIIA (pT3, pN1, cM0) - Signed by [name], MD on 08/05/2020 Stage prefix: Initial diagnosis   08/30/2020 - 10/11/2020 Chemotherapy         11/23/2020 -  Chemotherapy    Patient is on Treatment Plan: LUNG DURVALUMAB Q14D        ALLERGIES:  has No Known  Allergies.  MEDICATIONS:  Current Facility-Administered Medications  Medication Dose Route Frequency Provider Last Rate Last Admin   acetaminophen (TYLENOL) tablet 650 mg  650 mg Oral Q6H PRN Cox, Amy N, DO       Or   acetaminophen (TYLENOL) suppository 650 mg  650 mg Rectal Q6H PRN Cox, Amy N, DO       atorvastatin (LIPITOR) tablet 20 mg  20 mg Oral QHS Cox, Amy N, DO   20 mg at 12/25/20 2230   cefTRIAXone (ROCEPHIN) 1 g in sodium chloride 0.9 % 100 mL IVPB  1 g Intravenous q morning Cox, Amy N, DO 200 mL/hr at 12/26/20 0940 1 g at 12/26/20 0940   Chlorhexidine Gluconate Cloth 2 % PADS 6 each  6 each Topical Daily Lorella Nimrod, MD   6 each at 12/26/20 1013   clopidogrel (PLAVIX) tablet 75 mg  75 mg Oral Daily Cox, Amy N, DO   75 mg at 12/26/20 9147   cyanocobalamin ((VITAMIN B-12)) injection 1,000 mcg  1,000 mcg Intramuscular Daily Lorella Nimrod, MD   1,000 mcg at 12/26/20 0946   enoxaparin (LOVENOX) injection 40 mg  40 mg Subcutaneous Q24H Cox, Amy N, DO   40  mg at 12/25/20 2230   feeding supplement (ENSURE ENLIVE / ENSURE PLUS) liquid 237 mL  237 mL Oral TID BM Cox, Amy N, DO   237 mL at 12/26/20 1610   hydrALAZINE (APRESOLINE) tablet 10 mg  10 mg Oral Q6H PRN Cox, Amy N, DO       lactated ringers 1,000 mL with potassium chloride 20 mEq infusion   Intravenous Continuous Renda Rolls, RPH 100 mL/hr at 12/26/20 0245 New Bag at 12/26/20 0245   multivitamin with minerals tablet 1 tablet  1 tablet Oral Daily Cox, Amy N, DO   1 tablet at 12/26/20 0942   ondansetron (ZOFRAN) tablet 4 mg  4 mg Oral Q6H PRN Cox, Amy N, DO       Or   ondansetron (ZOFRAN) injection 4 mg  4 mg Intravenous Q6H PRN Cox, Amy N, DO       Facility-Administered Medications Ordered in Other Encounters  Medication Dose Route Frequency Provider Last Rate Last Admin   heparin lock flush 100 unit/mL  500 Units Intravenous Once [name], MD       sodium chloride flush (NS) 0.9 % injection 10 mL  10 mL Intravenous  PRN [name], MD   10 mL at 09/13/20 0904    VITAL SIGNS: BP (!) 100/48 (BP Location: Right Arm)   Pulse 60   Temp 98.3 F (36.8 C) (Oral)   Resp 20   Ht _0  (1.626 m)   Wt 130 lb 15.3 oz (59.4 kg)   SpO2 100%   BMI 22.48 kg/m  Filed Weights   12/24/20 1507 12/25/20 2125  Weight: 105 lb (47.6 kg) 130 lb 15.3 oz (59.4 kg)    Estimated body mass index is 22.48 kg/m as calculated from the following:   Height as of this encounter: _1  (1.626 m).   Weight as of this encounter: 130 lb 15.3 oz (59.4 kg).  LABS: CBC:    Component Value Date/Time   WBC 4.3 12/26/2020 0421   HGB 10.0 (L) 12/26/2020 0421   HCT 31.3 (L) 12/26/2020 0421   PLT 54 (L) 12/26/2020 0421   MCV 99.4 12/26/2020 0421   NEUTROABS 7.9 (H) 12/07/2020 0920   LYMPHSABS 0.8 12/07/2020 0920   MONOABS 0.6 12/07/2020 0920   EOSABS 0.0 12/07/2020 0920   BASOSABS 0.0 12/07/2020 0920   Comprehensive Metabolic Panel:    Component Value Date/Time   NA 135 12/26/2020 0421   K 3.8 12/26/2020 0421   CL 101 12/26/2020 0421   CO2 26 12/26/2020 0421   BUN 17 12/26/2020 0421   CREATININE 0.45 12/26/2020 0421   GLUCOSE 75 12/26/2020 0421   CALCIUM 8.2 (L) 12/26/2020 0421   AST 29 12/07/2020 0920   ALT 42 12/07/2020 0920   ALKPHOS 51 12/07/2020 0920   BILITOT 1.2 12/07/2020 0920   PROT 6.5 12/07/2020 0920   ALBUMIN 3.4 (L) 12/07/2020 0920    RADIOGRAPHIC STUDIES: DG Chest 2 View  Result Date: 12/24/2020 CLINICAL DATA:  History of lung cancer.  Increased weakness. EXAM: CHEST - 2 VIEW COMPARISON:  X-ray dated 11/07/2020. FINDINGS: Heart size and mediastinal contours are stable. LEFT chest wall pacemaker/ICD apparatus appears grossly stable in position. RIGHT chest wall Port-A-Cath appears grossly stable in position with tip at the level of the mid/lower SVC. RIGHT basilar atelectasis, similar to earlier exams. Additional chronic scarring/atelectasis at the LEFT lung base. Study is hypoinspiratory. No  confluent opacity to suggest a developing pneumonia. No pleural  effusion or pneumothorax is seen. Osseous structures about the chest are unremarkable. IMPRESSION: Low lung volumes. No acute findings. No evidence of pneumonia or alveolar pulmonary edema. Electronically Signed   By: Franki Cabot M.D.   On: 12/24/2020 16:17   CT HEAD WO CONTRAST (5MM)  Result Date: 12/24/2020 CLINICAL DATA:  Altered mental status. EXAM: CT HEAD WITHOUT CONTRAST TECHNIQUE: Contiguous axial images were obtained from the base of the skull through the vertex without intravenous contrast. COMPARISON:  October 15, 2020. FINDINGS: Brain: Mild diffuse cortical atrophy is noted. Mild chronic ischemic white matter disease is noted. No mass effect or midline shift is noted. Ventricular size is within normal limits. There is no evidence of mass lesion, hemorrhage or acute infarction. Vascular: No hyperdense vessel or unexpected calcification. Skull: Normal. Negative for fracture or focal lesion. Sinuses/Orbits: No acute finding. Other: None. IMPRESSION: No acute intracranial abnormality seen. Electronically Signed   By: Marijo Conception M.D.   On: 12/24/2020 16:21   US Venous Img Lower Bilateral (DVT)  Result Date: 12/24/2020 CLINICAL DATA:  Bilateral leg pain. EXAM: BILATERAL LOWER EXTREMITY VENOUS DOPPLER ULTRASOUND TECHNIQUE: Gray-scale sonography with compression, as well as color and duplex ultrasound, were performed to evaluate the deep venous system(s) from the level of the common femoral vein through the popliteal and proximal calf veins. COMPARISON:  None. FINDINGS: VENOUS Normal compressibility of the BILATERAL common femoral, superficial femoral, and popliteal veins, as well as the visualized calf veins. THE RIGHT posterior tibial and peroneal veins are poorly visualized. Visualized portions of the BILATERAL profunda femoral veins and BILATERAL great saphenous veins are unremarkable. No filling defects to suggest DVT on grayscale  or color Doppler imaging. Doppler waveforms show normal direction of venous flow, normal respiratory plasticity and response to augmentation. Limited views of the contralateral common femoral vein are unremarkable. OTHER None. Limitations: none IMPRESSION: No evidence of DVT within the RIGHT or LEFT lower extremity. Electronically Signed   By: Virgina Norfolk M.D.   On: 12/24/2020 23:57    PERFORMANCE STATUS (ECOG) : 4 - Bedbound  Review of Systems Unable to provide  Physical Exam General: Frail appearing Pulmonary: Congested Extremities: no edema, no joint deformities Skin: no rashes Neurological: Weakness, confused  IMPRESSION: Patient is confused and unable to meaningfully engage in a conversation regarding her goals.    I met with patient's niece Grayland Jack) and sister Tito Dine).  Family report that patient has significantly declined over the past several months with worsening weakness and confusion.  At baseline, patient is chair/bedbound.  She is unable to ambulate and requires mostly total care from family for ADLs.  Patient has recently demonstrated a pattern of recurrent hospitalizations associated with progressive decline/weakness.  She has been hospitalized monthly over the past 3 months.  Patient is pending CT of the chest for restaging of her lung cancer.  Family say that their primary goal is to ensure the patient is kept comfortable.  Sister became tearful as she described patient's poor quality of life.  Family would be interested in treating the treatable.  However, they recognize that there is a limit to how much medical care can improve patient's clinical situation.  They would not want futile care.    We discussed the option of hospice but we agreed to talk further after results of CT are known.  We did discuss CODE STATUS.  Family say that they have talked about this collectively and would not want to see patient resuscitated nor have  her life prolonged  artificially on machines.  They were in agreement DNR/DNI.  PLAN: -Continue current scope of treatment -DNR/DNI -Agree with CT of the chest for cancer restaging -Will follow  Case and plan discussed with Dr. Grayland Ormond   Time Total: 60 minutes  Visit consisted of counseling and education dealing with the complex and emotionally intense issues of symptom management and palliative care in the setting of serious and potentially life-threatening illness.Greater than 50%  of this time was spent counseling and coordinating care related to the above assessment and plan.  Signed by: Altha Harm, PhD, NP-C

## 2020-12-26 NOTE — Consult Note (Signed)
Hematology/Oncology Consult Note [facility]  Telephone:([phone] [phone]    Patient Care Team: Practice, [facility] as PCP - General [name], MD as PCP - Cardiology (Cardiology) Theodore Demark, RN as Registered Nurse [name], MD as Consulting Physician (Cardiology)   Name of the patient: [name]  [id number]  February 04, 1934   Date of visit: 12/26/2020  History of Presenting Illness: Patient is 87 year old female with past medical history of atrial fibrillation, pacemaker, non stemi, ckd, hpt, hyperlipidemia, stage Ia ER/PR positive right breast cancer s/p lumpectomy 12/03/2018, currently on letrozole, now with 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 She has completed 5 cycles of weekly carbo-taxol on October 10, 2020 and XRT on October 20, 2020. She has now received 2 cycles of maintenance durvalumab.   CT Imaging of brain in April was negative for metastatic disease.   Patient says she feels fine today. Was brought to the hospital for 'being sick'. Doesn't elaborate. Says she feels 'fine'.    ECOG PS- 3  Review of Systems  Reason unable to perform ROS: ROS d/t patient cognition changes.  Constitutional:  Positive for malaise/fatigue. Negative for chills, fever and weight loss.  Respiratory:  Negative for cough, shortness of breath and wheezing.   Gastrointestinal:  Negative for abdominal pain, constipation, diarrhea, nausea and vomiting.  Skin:  Negative for itching and rash.  Neurological:  Positive for weakness.  Psychiatric/Behavioral:  Positive for memory loss.    No Known Allergies  Patient Active Problem List   Diagnosis Date Noted   Community acquired pneumonia 11/08/2020   Postobstructive pneumonia 11/08/2020   Demand ischemia ([place]) 11/08/2020   NSTEMI (non-ST elevated myocardial infarction) ([place])  11/07/2020   Leukopenia due to antineoplastic chemotherapy ([place]) 11/07/2020   Protein-calorie malnutrition, severe 10/17/2020   Weakness 10/15/2020   UTI (urinary tract infection) 10/15/2020   Fall at home, initial encounter 10/15/2020   CKD (chronic kidney disease), stage IIIa 10/15/2020   Hyperkalemia 10/15/2020   Hypercalcemia 10/15/2020   Severe sepsis ([place]) 10/15/2020   Squamous cell carcinoma of right lung ([place]) 08/05/2020   Hemoptysis 07/25/2020   Malignant neoplasm of upper-outer quadrant of right breast in female, estrogen receptor positive ([place]) 10/31/2018   Enteritis due to Clostridium difficile    Malnutrition of moderate degree 07/15/2018   Cough    Ileus (HCC)    Hypokalemia 07/13/2018   Low blood magnesium 07/13/2018   Vomiting 07/13/2018   Loss of weight 06/27/2018   Rhabdomyolysis 05/26/2018   History of CVA (cerebrovascular accident) 05/26/2018   Hyperlipidemia 05/26/2018   AKI (acute kidney injury) ([place]) 05/26/2018   Transaminitis 05/26/2018   Elevated troponin 05/26/2018   Gait abnormality 07/23/2016   Essential hypertension 06/11/2016   Hypertensive urgency 06/11/2016   CAD (coronary artery disease) 06/11/2016   Pacemaker-Medtronic 08/07/2011   Atrial fibrillation ([place]) 08/07/2011   Daytime somnolence 08/07/2011   HYPERTENSION, HEART CONTROLLED W/O ASSOC CHF 06/07/2010   AV BLOCK, COMPLETE 06/07/2010   Cerebral artery occlusion with cerebral infarction ([place]) 06/07/2010     Past Medical History:  Diagnosis Date   Breast cancer, right ([place]) 11/2018   Mastectomy   C. difficile colitis    CKD (chronic kidney disease), stage III ([place])    Complete heart block ([place])    a. Originally placed 08/2004; b. 07/2013 s/p MDT [place] PPM (  ser # [id number]).   Dyspnea    Gait abnormality 07/23/2016   GERD (gastroesophageal reflux disease)    Gout    Hemoptysis 07/25/2020   History of hypertension    History of stroke    Hyperhomocysteinemia (HCC)     Hyperlipidemia    Hypertension    Hypokalemia 07/13/2018   Low blood magnesium 07/13/2018   Lung cancer ([place])    Pacemaker-Medtronic 08/07/2011   PAF (paroxysmal atrial fibrillation) ([place])    a. 07/2020 Device interrogation-->13.3% AF burden-->longest atrial high rate episode 24:26 mins-->AT vs AF. Not on [place].   Pulmonary hypertension ([place])    a. 06/2018 Echo: EF 60-65%, no rwma, nl RV fxn, RVSP 48.47mmHg. Mild AoV Ca2+.   Rhabdomyolysis    Stroke [facility])    left sided weakness, 07/28/20- no residual    Vomiting 07/13/2018     Past Surgical History:  Procedure Laterality Date   BIOPSY  07/15/2018   Procedure: BIOPSY;  Surgeon: [name], MD;  Location: [facility];  Service: Gastroenterology;;   BREAST BIOPSY Right 10/27/2018   affirm bx of mass, x clip,  INVASIVE MAMMARY CARCINOMA   BREAST LUMPECTOMY Right 12/03/2018   BRONCHIAL BIOPSY  07/29/2020   Procedure: BRONCHIAL BIOPSIES;  Surgeon: Laurin Coder, MD;  Location: [facility];  Service: Pulmonary;;   BRONCHIAL NEEDLE ASPIRATION BIOPSY  07/29/2020   Procedure: BRONCHIAL NEEDLE ASPIRATION BIOPSIES;  Surgeon: Laurin Coder, MD;  Location: [facility];  Service: Pulmonary;;   CARDIAC CATHETERIZATION     ENDOBRONCHIAL ULTRASOUND N/A 07/29/2020   Procedure: ENDOBRONCHIAL ULTRASOUND;  Surgeon: Laurin Coder, MD;  Location: [facility];  Service: Pulmonary;  Laterality: N/A;   ESOPHAGOGASTRODUODENOSCOPY Left 07/15/2018   Procedure: ESOPHAGOGASTRODUODENOSCOPY (EGD);  Surgeon: [name], MD;  Location: [facility];  Service: Gastroenterology;  Laterality: Left;   HEMOSTASIS CONTROL  07/29/2020   Procedure: HEMOSTASIS CONTROL;  Surgeon: Laurin Coder, MD;  Location: [facility];  Service: Pulmonary;;   PACEMAKER INSERTION     Medtronic Enpulse dual-chamber pacemaker   PARTIAL MASTECTOMY WITH NEEDLE LOCALIZATION AND AXILLARY SENTINEL LYMPH NODE BX Right 12/03/2018   Procedure: PARTIAL MASTECTOMY WITH NEEDLE  LOCALIZATION AND AXILLARY SENTINEL LYMPH NODE BX, RIGHT;  Surgeon: [name], MD;  Location: [facility] ORS;  Service: General;  Laterality: Right;   PERMANENT PACEMAKER GENERATOR CHANGE N/A 07/30/2013   Procedure: PERMANENT PACEMAKER GENERATOR CHANGE;  Surgeon: [name], MD;  Location: [facility] CATH LAB;  Service: Cardiovascular;  Laterality: N/A;   PORTA CATH INSERTION N/A 08/16/2020   Procedure: PORTA CATH INSERTION;  Surgeon: Katha Cabal, MD;  Location: [facility] CV LAB;  Service: Cardiovascular;  Laterality: N/A;   VIDEO BRONCHOSCOPY N/A 07/29/2020   Procedure: VIDEO BRONCHOSCOPY WITH FLUORO;  Surgeon: Laurin Coder, MD;  Location: [facility];  Service: Pulmonary;  Laterality: N/A;    Social History   Socioeconomic History   Marital status: Single    Spouse name: Not on file   Number of children: 3   Years of education: 12   Highest education level: Not on file  Occupational History   Occupation: Retired  Tobacco Use   Smoking status: Former    Types: Cigarettes    Quit date: 07/25/2006    Years since quitting: 14.4   Smokeless tobacco: Never  Vaping Use   Vaping Use: Never used  Substance and Sexual Activity   Alcohol use: No   Drug use: No   Sexual activity: Not on file  Other  Topics Concern   Not on file  Social History Narrative   Lives locally by herself.  Family nearby, but she arranges all of her own medicines and takes care of her home.   Caffeine use: Coffee daily   Right handed   Social Determinants of Health   Financial Resource Strain: Not on file  Food Insecurity: Not on file  Transportation Needs: Not on file  Physical Activity: Not on file  Stress: Not on file  Social Connections: Not on file  Intimate Partner Violence: Not on file    Family History  Problem Relation Age of Onset   Hypertension Mother    Breast cancer Sister    Breast cancer Sister    Colon cancer Neg Hx    Esophageal cancer Neg Hx      Current  Facility-Administered Medications:    acetaminophen (TYLENOL) tablet 650 mg, 650 mg, Oral, Q6H PRN **OR** acetaminophen (TYLENOL) suppository 650 mg, 650 mg, Rectal, Q6H PRN, Cox, Amy N, DO   atorvastatin (LIPITOR) tablet 20 mg, 20 mg, Oral, QHS, Cox, Amy N, DO, 20 mg at 12/25/20 2230   cefTRIAXone (ROCEPHIN) 1 g in sodium chloride 0.9 % 100 mL IVPB, 1 g, Intravenous, q morning, Cox, Amy N, DO, Last Rate: 200 mL/hr at 12/26/20 0940, 1 g at 12/26/20 0940   Chlorhexidine Gluconate Cloth 2 % PADS 6 each, 6 each, Topical, Daily, Lorella Nimrod, MD, 6 each at 12/26/20 1013   clopidogrel (PLAVIX) tablet 75 mg, 75 mg, Oral, Daily, Cox, Amy N, DO, 75 mg at 12/26/20 2376   cyanocobalamin ((VITAMIN B-12)) injection 1,000 mcg, 1,000 mcg, Intramuscular, Daily, Lorella Nimrod, MD, 1,000 mcg at 12/26/20 0946   enoxaparin (LOVENOX) injection 40 mg, 40 mg, Subcutaneous, Q24H, Cox, Amy N, DO, 40 mg at 12/25/20 2230   feeding supplement (ENSURE ENLIVE / ENSURE PLUS) liquid 237 mL, 237 mL, Oral, TID BM, Cox, Amy N, DO, 237 mL at 12/26/20 0942   hydrALAZINE (APRESOLINE) tablet 10 mg, 10 mg, Oral, Q6H PRN, Cox, Amy N, DO   lactated ringers 1,000 mL with potassium chloride 20 mEq infusion, , Intravenous, Continuous, Belue, Alver Sorrow, RPH, Last Rate: 100 mL/hr at 12/26/20 0245, New Bag at 12/26/20 0245   multivitamin with minerals tablet 1 tablet, 1 tablet, Oral, Daily, Cox, Amy N, DO, 1 tablet at 12/26/20 0942   ondansetron (ZOFRAN) tablet 4 mg, 4 mg, Oral, Q6H PRN **OR** ondansetron (ZOFRAN) injection 4 mg, 4 mg, Intravenous, Q6H PRN, Cox, Amy N, DO  Facility-Administered Medications Ordered in Other Encounters:    heparin lock flush 100 unit/mL, 500 Units, Intravenous, Once, Finnegan, [name], MD   sodium chloride flush (NS) 0.9 % injection 10 mL, 10 mL, Intravenous, PRN, Grayland Ormond, [name], MD, 10 mL at 09/13/20 0904  BP (!) 100/48 (BP Location: Right Arm)   Pulse 60   Temp 98.3 F (36.8 C) (Oral)   Resp 20   Ht  5\' 4"  (1.626 m)   Wt 130 lb 15.3 oz (59.4 kg)   SpO2 100%   BMI 22.48 kg/m    Physical Exam Constitutional:      Comments: Laying in hospital bed, sleeping. Awakens by calling her name  Cardiovascular:     Rate and Rhythm: Normal rate and regular rhythm.  Pulmonary:     Effort: Pulmonary effort is normal.     Breath sounds: Normal breath sounds.  Abdominal:     General: There is no distension.     Tenderness: There  is no abdominal tenderness.  Musculoskeletal:     Right lower leg: No edema.     Left lower leg: No edema.  Neurological:     Mental Status: She is oriented to person, place, and time. She is lethargic.     Motor: Weakness present.     Comments: Limited insight into acute or chronic medical conditions. One words responses  Psychiatric:        Mood and Affect: Affect is flat.        Behavior: Behavior is slowed. Behavior is cooperative.        Cognition and Memory: Cognition is impaired.    CMP Latest Ref Rng & Units 12/26/2020  Glucose 70 - 99 mg/dL 75  BUN 8 - 23 mg/dL 17  Creatinine 0.44 - 1.00 mg/dL 0.45  Sodium 135 - 145 mmol/L 135  Potassium 3.5 - 5.1 mmol/L 3.8  Chloride 98 - 111 mmol/L 101  CO2 22 - 32 mmol/L 26  Calcium 8.9 - 10.3 mg/dL 8.2(L)  Total Protein 6.5 - 8.1 g/dL -  Total Bilirubin 0.3 - 1.2 mg/dL -  Alkaline Phos 38 - 126 U/L -  AST 15 - 41 U/L -  ALT 0 - 44 U/L -   CBC Latest Ref Rng & Units 12/26/2020  WBC 4.0 - 10.5 K/uL 4.3  Hemoglobin 12.0 - 15.0 g/dL 10.0(L)  Hematocrit 36.0 - 46.0 % 31.3(L)  Platelets 150 - 400 K/uL 54(L)    DG Chest 2 View  Result Date: 12/24/2020 CLINICAL DATA:  History of lung cancer.  Increased weakness. EXAM: CHEST - 2 VIEW COMPARISON:  X-ray dated 11/07/2020. FINDINGS: Heart size and mediastinal contours are stable. LEFT chest wall pacemaker/ICD apparatus appears grossly stable in position. RIGHT chest wall Port-A-Cath appears grossly stable in position with tip at the level of the mid/lower SVC. RIGHT  basilar atelectasis, similar to earlier exams. Additional chronic scarring/atelectasis at the LEFT lung base. Study is hypoinspiratory. No confluent opacity to suggest a developing pneumonia. No pleural effusion or pneumothorax is seen. Osseous structures about the chest are unremarkable. IMPRESSION: Low lung volumes. No acute findings. No evidence of pneumonia or alveolar pulmonary edema. Electronically Signed   By: Franki Cabot M.D.   On: 12/24/2020 16:17   CT HEAD WO CONTRAST (5MM)  Result Date: 12/24/2020 CLINICAL DATA:  Altered mental status. EXAM: CT HEAD WITHOUT CONTRAST TECHNIQUE: Contiguous axial images were obtained from the base of the skull through the vertex without intravenous contrast. COMPARISON:  October 15, 2020. FINDINGS: Brain: Mild diffuse cortical atrophy is noted. Mild chronic ischemic white matter disease is noted. No mass effect or midline shift is noted. Ventricular size is within normal limits. There is no evidence of mass lesion, hemorrhage or acute infarction. Vascular: No hyperdense vessel or unexpected calcification. Skull: Normal. Negative for fracture or focal lesion. Sinuses/Orbits: No acute finding. Other: None. IMPRESSION: No acute intracranial abnormality seen. Electronically Signed   By: Marijo Conception M.D.   On: 12/24/2020 16:21   US Venous Img Lower Bilateral (DVT)  Result Date: 12/24/2020 CLINICAL DATA:  Bilateral leg pain. EXAM: BILATERAL LOWER EXTREMITY VENOUS DOPPLER ULTRASOUND TECHNIQUE: Gray-scale sonography with compression, as well as color and duplex ultrasound, were performed to evaluate the deep venous system(s) from the level of the common femoral vein through the popliteal and proximal calf veins. COMPARISON:  None. FINDINGS: VENOUS Normal compressibility of the BILATERAL common femoral, superficial femoral, and popliteal veins, as well as the visualized calf veins. THE RIGHT  posterior tibial and peroneal veins are poorly visualized. Visualized portions of  the BILATERAL profunda femoral veins and BILATERAL great saphenous veins are unremarkable. No filling defects to suggest DVT on grayscale or color Doppler imaging. Doppler waveforms show normal direction of venous flow, normal respiratory plasticity and response to augmentation. Limited views of the contralateral common femoral vein are unremarkable. OTHER None. Limitations: none IMPRESSION: No evidence of DVT within the RIGHT or LEFT lower extremity. Electronically Signed   By: Virgina Norfolk M.D.   On: 12/24/2020 23:57     Assessment and plan- Patient is a 87 y.o. female currently receiving immunotherapy for lung cancer who is currently admitted to hospital for progressive weakness and altered mental status.   Stage III lung cancer- s/p concurrent chemo and radiation in June 2022. Now s/p 2 cycles of maintenance durvalumab with plan to continue for 1 year. Etiology of weakness is unclear. Chest xray was unrevealing. Labs stable. Recommend CT Chest w/ contrast for restaging in setting of progressive weakness and altered mental status. Question progressive disease vs other etiologies. No obvious etiology for altered mental status and may consider CT head to evaluate for metastatic disease. CT Head wo contrast was negative. MRI contraindicated d/t pacemaker.   Thrombocytopenia- platelet count 54. No obvious bleeding (see below). Monitor.   Anemia- hemoglobin 10.0. Was 11.2 yesterday and normal upon admission. Wouldn't expect anemia or thrombocytopenia with immunotherapy. Would consider evaluating for other etiologies.   Goals of care- patient to meet with palliative care later today. Will update patient's daughter regarding plan of care.   Delaney Meigs, DNP [place] [facility] 12/26/2020 11:45 AM

## 2020-12-26 NOTE — Evaluation (Signed)
Occupational Therapy Evaluation Patient Details Name: [name] MRN: [id number] DOB: 1933-07-29 Today's Date: 12/26/2020    History of Present Illness Pt is an 87 y/o F admitted on 12/24/20 with c/c of progressive weakness over the last month -may be multifactorial in setting of hypokalemia and possible urinary tract infection with suprapubic tenderness and leukocytosis on UA. PMH: R breast CA stage Ia ER/PR positive, HER2 negative, stage 3a squamous cell carcinoma of L lung, HTN, CKD stage 3a, complete heart block s/p pacemaker, R mastectomy, paroxysmal a-fib not on anticoagulation, pulmonary HTN, stroke with no residual effects   Clinical Impression   [name] displays minimal interaction with health care team and family members today. She is able to state her name, knows what year it is, and knows that she is in "a hospital." She cannot name the president, cannot identify the month or season. She makes little to no eye contact, does not respond to simple directions such as "squeeze my hand," and does not assist in bed mobility efforts, which require MaxA +2. Pt has been living in an apt with 24-hour assistance provided by various family members, with pt requiring Max-Total A for all aspects of fxl mobility. While hospitalized, pt could benefit from ongoing OT, to address recent declines in balance, bed mobility, and transfer ability -- improving her safety while reducing caregiver burden. Therapist and family members (sister, granddaughter, niece) had discussion re: goals of care. Pt was unable to participate meaningfully in this conversation. DC plans TBD. Family states that they would like to keep pt at home if at all possible, but that they are feeling "worn out" from high care levels required in past several months. If pt might be eligible for assistance with home health, allowing family members to have some respite, pt could DC home with [place], significant additional care, and  24-supervision. Based on results of lung CT scheduled for tomorrow, which should provide updated info re: staging of pt's cancer, family may be interested in home hospice.     Follow Up Recommendations  Supervision/Assistance - 24 hour;Other (comment) (to be determined)    Equipment Recommendations  None recommended by OT    Recommendations for Other Services  (info re: CAP program, possible home care assistance)     Precautions / Restrictions Precautions Precautions: Fall Restrictions Weight Bearing Restrictions: No      Mobility Bed Mobility Overal bed mobility: Needs Assistance       Supine to sit: Max assist;+2 for physical assistance Sit to supine: Max assist;+2 for physical assistance        Transfers Overall transfer level: Needs assistance               General transfer comment: unable/unsafe to attempt    Balance Overall balance assessment: Needs assistance   Sitting balance-Leahy Scale: Zero       Standing balance-Leahy Scale: Zero                             ADL either performed or assessed with clinical judgement   ADL Overall ADL's : Needs assistance/impaired                                     Functional mobility during ADLs: Total assistance;Maximal assistance;+2 for physical assistance       Vision  Perception     Praxis      Pertinent Vitals/Pain Pain Assessment: No/denies pain     Hand Dominance     Extremity/Trunk Assessment Upper Extremity Assessment Upper Extremity Assessment: Generalized weakness   Lower Extremity Assessment Lower Extremity Assessment: Generalized weakness       Communication Communication Communication: HOH   Cognition Arousal/Alertness: Awake/alert Behavior During Therapy: Flat affect Overall Cognitive Status: History of cognitive impairments - at baseline                                 General Comments: Able to provide name and year,  generally non-communicative, does not follow simple, one-step directions   General Comments       Exercises Other Exercises Other Exercises: Extended discussion with family members re: goals of care, DC options   Shoulder Instructions      Home Living Family/patient expects to be discharged to:: Private residence Living Arrangements: Children;Other relatives Available Help at Discharge: Family;Available 24 hours/day Type of Home: Apartment       Home Layout: One level               Home Equipment: Grab bars - toilet;Grab bars - tub/shower;Tub bench;Walker - 2 wheels;Toilet riser;Bedside commode   Additional Comments: pt uses briefs and BSC for toileting, sponge baths for bathing      Prior Functioning/Environment Level of Independence: Needs assistance  Gait / Transfers Assistance Needed: mostly bedbound, family assists with transfers to [facility] and recliner ADL's / Homemaking Assistance Needed: has aide or family member present 54 hrs/day to assist with all ADL/IADL            OT Problem List: Decreased strength;Impaired balance (sitting and/or standing);Decreased cognition;Decreased range of motion;Decreased activity tolerance      OT Treatment/Interventions: Self-care/ADL training;Therapeutic activities;Balance training;Patient/family education;Therapeutic exercise    OT Goals(Current goals can be found in the care plan section) Acute Rehab OT Goals Patient Stated Goal: to keep pt comfortable OT Goal Formulation: With family Time For Goal Achievement: 01/09/21 Potential to Achieve Goals: Good ADL Goals Pt Will Transfer to Toilet: bedside commode;squat pivot transfer;with max assist (with pt able to communicate when she needs to transfer to [facility] for BM.) Additional ADL Goal #1: Family will be able to identify/demonstrate 2+ strategies for preventing pt skin breakdown  OT Frequency: Min 1X/week   Barriers to D/C:            Co-evaluation               AM-PAC OT "6 Clicks" Daily Activity     Outcome Measure Help from another person eating meals?: A Lot Help from another person taking care of personal grooming?: A Lot Help from another person toileting, which includes using toliet, bedpan, or urinal?: A Lot Help from another person bathing (including washing, rinsing, drying)?: A Lot Help from another person to put on and taking off regular upper body clothing?: A Lot Help from another person to put on and taking off regular lower body clothing?: A Lot 6 Click Score: 12   End of Session    Activity Tolerance: Patient limited by lethargy Patient left: in bed;with family/visitor present;with call bell/phone within reach  OT Visit Diagnosis: Other abnormalities of gait and mobility (R26.89);Muscle weakness (generalized) (M62.81);Adult, failure to thrive (R62.7)                Time: 0165-5374 OT Time Calculation (min):  20 min Charges:  OT General Charges $OT Visit: 1 Visit OT Evaluation $OT Eval Moderate Complexity: 1 Mod OT Treatments $Self Care/Home Management : 8-22 mins Josiah Lobo, PhD, MS, OTR/L 12/26/20, 2:11 PM

## 2020-12-27 DIAGNOSIS — R531 Weakness: Secondary | ICD-10-CM | POA: Diagnosis not present

## 2020-12-27 DIAGNOSIS — Z515 Encounter for palliative care: Secondary | ICD-10-CM | POA: Diagnosis not present

## 2020-12-27 NOTE — Consult Note (Signed)
[place]  Telephone:([phone] [phone]   Name: [name] Date: 12/27/2020 MRN: [id number]  DOB: 1934-03-18  Patient Care Team: Practice, [facility] Family as PCP - General [name], MD as PCP - Cardiology (Cardiology) Theodore Demark, RN as Registered Nurse [name], MD as Consulting Physician (Cardiology)    REASON FOR CONSULTATION: [name] is a 87 y.o. 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  Patient was hospitalized 10/15/2020-10/22/2020 with weakness thought secondary to UTI.  She discharged to SNF but family quickly brought her home due to poor care.  Patient was hospitalized again 11/07/2020-11/09/2020 with elevated troponins thought due to demand ischemia after presenting to the ER with cough and weakness.  Patient is now readmitted 12/24/2020 again with weakness and possible UTI.  Palliative care is consulted to address goals.   CODE STATUS: DNR  PAST MEDICAL HISTORY: Past Medical History:  Diagnosis Date   Breast cancer, right ([place]) 11/2018   Mastectomy   C. difficile colitis    CKD (chronic kidney disease), stage III ([place])    Complete heart block ([place])    a. Originally placed 08/2004; b. 07/2013 s/p MDT [place] PPM (ser # [id number]).   Dyspnea    Gait abnormality 07/23/2016   GERD (gastroesophageal reflux disease)    Gout    Hemoptysis 07/25/2020   History of hypertension    History of stroke    Hyperhomocysteinemia (HCC)    Hyperlipidemia    Hypertension    Hypokalemia 07/13/2018   Low blood magnesium 07/13/2018   Lung cancer ([place])    Pacemaker-Medtronic 08/07/2011   PAF (paroxysmal atrial  fibrillation) ([place])    a. 07/2020 Device interrogation-->13.3% AF burden-->longest atrial high rate episode 24:26 mins-->AT vs AF. Not on [place].   Pulmonary hypertension ([place])    a. 06/2018 Echo: EF 60-65%, no rwma, nl RV fxn, RVSP 48.70mHg. Mild AoV Ca2+.   Rhabdomyolysis    Stroke ([facility]    left sided weakness, 07/28/20- no residual    Vomiting 07/13/2018    PAST SURGICAL HISTORY:  Past Surgical History:  Procedure Laterality Date   BIOPSY  07/15/2018   Procedure: BIOPSY;  Surgeon: GGatha Mayer MD;  Location: [facility];  Service: Gastroenterology;;   BREAST BIOPSY Right 10/27/2018   affirm bx of mass, x clip,  INVASIVE MAMMARY CARCINOMA   BREAST LUMPECTOMY Right 12/03/2018   BRONCHIAL BIOPSY  07/29/2020   Procedure: BRONCHIAL BIOPSIES;  Surgeon: OLaurin Coder MD;  [place] [facility]  Service: Pulmonary;;   BRONCHIAL NEEDLE ASPIRATION BIOPSY  07/29/2020   Procedure: BRONCHIAL NEEDLE ASPIRATION BIOPSIES;  Surgeon: OLaurin Coder MD;  [place]  Service: Pulmonary;;   CARDIAC CATHETERIZATION     ENDOBRONCHIAL ULTRASOUND N/A 07/29/2020   Procedure: ENDOBRONCHIAL ULTRASOUND;  Surgeon: OLaurin Coder MD;  [place]  Service: Pulmonary;  Laterality: N/A;   ESOPHAGOGASTRODUODENOSCOPY Left 07/15/2018   Procedure: ESOPHAGOGASTRODUODENOSCOPY (EGD);  Surgeon: GGatha Mayer MD;  [facility];  Service: Gastroenterology;  Laterality: Left;   HEMOSTASIS CONTROL  07/29/2020   Procedure: HEMOSTASIS CONTROL;  Surgeon: OLaurin Coder MD;  [facility];  Service: Pulmonary;;   PACEMAKER INSERTION  Medtronic Enpulse dual-chamber pacemaker   PARTIAL MASTECTOMY WITH NEEDLE LOCALIZATION AND AXILLARY SENTINEL LYMPH NODE BX Right 12/03/2018   Procedure: PARTIAL MASTECTOMY WITH NEEDLE LOCALIZATION AND AXILLARY SENTINEL LYMPH NODE BX, RIGHT;  Surgeon: [name], MD;  Location: [facility] ORS;  Service: General;  Laterality: Right;    PERMANENT PACEMAKER GENERATOR CHANGE N/A 07/30/2013   Procedure: PERMANENT PACEMAKER GENERATOR CHANGE;  Surgeon: [name], MD;  [facility];  Service: Cardiovascular;  Laterality: N/A;   PORTA CATH INSERTION N/A 08/16/2020   Procedure: PORTA CATH INSERTION;  Surgeon: Katha Cabal, MD;  Location: [place] CV LAB;  Service: Cardiovascular;  Laterality: N/A;   VIDEO BRONCHOSCOPY N/A 07/29/2020   Procedure: VIDEO BRONCHOSCOPY WITH FLUORO;  Surgeon: Laurin Coder, MD;  Location: [place] ENDOSCOPY;  Service: Pulmonary;  Laterality: N/A;    HEMATOLOGY/ONCOLOGY HISTORY:  Oncology History  Malignant neoplasm of upper-outer quadrant of right breast in female, estrogen receptor positive ([place])  10/31/2018 Initial Diagnosis   Malignant neoplasm of upper-outer quadrant of right breast in female, estrogen receptor positive ([place])   11/06/2018 Cancer Staging   Staging form: Breast, AJCC 8th Edition - Clinical stage from 11/06/2018: Stage IA (cT1a, cN0, cM0, G1, ER+, PR+, HER2-) - Signed by [name], MD on 11/06/2018   Squamous cell carcinoma of right lung ([place])  08/05/2020 Initial Diagnosis   Squamous cell carcinoma of right lung ([place])   08/05/2020 Cancer Staging   Staging form: Lung, AJCC 8th Edition - Pathologic stage from 08/05/2020: Stage IIIA (pT3, pN1, cM0) - Signed by [name], MD on 08/05/2020 Stage prefix: Initial diagnosis   08/30/2020 - 10/11/2020 Chemotherapy          11/23/2020 -  Chemotherapy    Patient is on Treatment Plan: LUNG DURVALUMAB Q14D         ALLERGIES:  has No Known Allergies.  MEDICATIONS:  Current Facility-Administered Medications  Medication Dose Route Frequency Provider Last Rate Last Admin   acetaminophen (TYLENOL) tablet 650 mg  650 mg Oral Q6H PRN Cox, Amy N, DO       Or   acetaminophen (TYLENOL) suppository 650 mg  650 mg Rectal Q6H PRN Cox, Amy N, DO       atorvastatin (LIPITOR) tablet 20 mg  20 mg Oral QHS Cox, Amy N, DO   20  mg at 12/26/20 2202   Chlorhexidine Gluconate Cloth 2 % PADS 6 each  6 each Topical Daily Lorella Nimrod, MD   6 each at 12/27/20 1002   clopidogrel (PLAVIX) tablet 75 mg  75 mg Oral Daily Cox, Amy N, DO   75 mg at 12/27/20 9628   cyanocobalamin ((VITAMIN B-12)) injection 1,000 mcg  1,000 mcg Intramuscular Daily Lorella Nimrod, MD   1,000 mcg at 12/27/20 0953   enoxaparin (LOVENOX) injection 40 mg  40 mg Subcutaneous Q24H Cox, Amy N, DO   40 mg at 12/26/20 2202   feeding supplement (ENSURE ENLIVE / ENSURE PLUS) liquid 237 mL  237 mL Oral TID BM Cox, Amy N, DO   237 mL at 12/26/20 1408   hydrALAZINE (APRESOLINE) tablet 10 mg  10 mg Oral Q6H PRN Cox, Amy N, DO       lactated ringers 1,000 mL with potassium chloride 20 mEq infusion   Intravenous Continuous Renda Rolls, RPH 100 mL/hr at 12/27/20 0013 New Bag at 12/27/20 0013   multivitamin with minerals tablet 1 tablet  1 tablet Oral Daily Cox, Amy N, DO  1 tablet at 12/27/20 0953   ondansetron (ZOFRAN) tablet 4 mg  4 mg Oral Q6H PRN Cox, Amy N, DO       Or   ondansetron (ZOFRAN) injection 4 mg  4 mg Intravenous Q6H PRN Cox, Amy N, DO       Facility-Administered Medications Ordered in Other Encounters  Medication Dose Route Frequency Provider Last Rate Last Admin   heparin lock flush 100 unit/mL  500 Units Intravenous Once [name], MD       sodium chloride flush (NS) 0.9 % injection 10 mL  10 mL Intravenous PRN [name], MD   10 mL at 09/13/20 0904    VITAL SIGNS: BP (!) 107/56   Pulse 60   Temp 98.5 F (36.9 C) (Oral)   Resp 16   Ht 5' 4"  (1.626 m)   Wt 130 lb 15.3 oz (59.4 kg)   SpO2 97%   BMI 22.48 kg/m  Filed Weights   12/24/20 1507 12/25/20 2125  Weight: 105 lb (47.6 kg) 130 lb 15.3 oz (59.4 kg)    Estimated body mass index is 22.48 kg/m as calculated from the following:   Height as of this encounter: 5' 4"  (1.626 m).   Weight as of this encounter: 130 lb 15.3 oz (59.4 kg).  LABS: CBC:    Component  Value Date/Time   WBC 4.3 12/26/2020 0421   HGB 10.0 (L) 12/26/2020 0421   HCT 31.3 (L) 12/26/2020 0421   PLT 54 (L) 12/26/2020 0421   MCV 99.4 12/26/2020 0421   NEUTROABS 7.9 (H) 12/07/2020 0920   LYMPHSABS 0.8 12/07/2020 0920   MONOABS 0.6 12/07/2020 0920   EOSABS 0.0 12/07/2020 0920   BASOSABS 0.0 12/07/2020 0920   Comprehensive Metabolic Panel:    Component Value Date/Time   NA 135 12/26/2020 0421   K 3.8 12/26/2020 0421   CL 101 12/26/2020 0421   CO2 26 12/26/2020 0421   BUN 17 12/26/2020 0421   CREATININE 0.45 12/26/2020 0421   GLUCOSE 75 12/26/2020 0421   CALCIUM 8.2 (L) 12/26/2020 0421   AST 29 12/07/2020 0920   ALT 42 12/07/2020 0920   ALKPHOS 51 12/07/2020 0920   BILITOT 1.2 12/07/2020 0920   PROT 6.5 12/07/2020 0920   ALBUMIN 3.4 (L) 12/07/2020 0920    RADIOGRAPHIC STUDIES: DG Chest 2 View  Result Date: 12/24/2020 CLINICAL DATA:  History of lung cancer.  Increased weakness. EXAM: CHEST - 2 VIEW COMPARISON:  X-ray dated 11/07/2020. FINDINGS: Heart size and mediastinal contours are stable. LEFT chest wall pacemaker/ICD apparatus appears grossly stable in position. RIGHT chest wall Port-A-Cath appears grossly stable in position with tip at the level of the mid/lower SVC. RIGHT basilar atelectasis, similar to earlier exams. Additional chronic scarring/atelectasis at the LEFT lung base. Study is hypoinspiratory. No confluent opacity to suggest a developing pneumonia. No pleural effusion or pneumothorax is seen. Osseous structures about the chest are unremarkable. IMPRESSION: Low lung volumes. No acute findings. No evidence of pneumonia or alveolar pulmonary edema. Electronically Signed   By: Franki Cabot M.D.   On: 12/24/2020 16:17   CT HEAD WO CONTRAST (5MM)  Result Date: 12/24/2020 CLINICAL DATA:  Altered mental status. EXAM: CT HEAD WITHOUT CONTRAST TECHNIQUE: Contiguous axial images were obtained from the base of the skull through the vertex without intravenous  contrast. COMPARISON:  October 15, 2020. FINDINGS: Brain: Mild diffuse cortical atrophy is noted. Mild chronic ischemic white matter disease is noted. No mass effect or midline  shift is noted. Ventricular size is within normal limits. There is no evidence of mass lesion, hemorrhage or acute infarction. Vascular: No hyperdense vessel or unexpected calcification. Skull: Normal. Negative for fracture or focal lesion. Sinuses/Orbits: No acute finding. Other: None. IMPRESSION: No acute intracranial abnormality seen. Electronically Signed   By: Marijo Conception M.D.   On: 12/24/2020 16:21   CT CHEST W CONTRAST  Result Date: 12/26/2020 CLINICAL DATA:  Non-small cell lung cancer restaging. Status post chemo radiation therapy. EXAM: CT CHEST WITH CONTRAST TECHNIQUE: Multidetector CT imaging of the chest was performed during intravenous contrast administration. CONTRAST:  84m OMNIPAQUE IOHEXOL 350 MG/ML SOLN COMPARISON:  CT 07/26/2018 FINDINGS: Cardiovascular: Port in the anterior chest wall with tip in distal SVC. Pacer leads in the RIGHT heart. Mediastinum/Nodes: No axillary or supraclavicular adenopathy. No mediastinal or hilar adenopathy. No pericardial fluid. Esophagus normal. Lungs/Pleura: Volume loss in the RIGHT hemithorax. Interval expansion of the RIGHT lower lobe. Interval decrease in size of RIGHT hilar mass. There is persistent consolidation in the RIGHT lower lobe (image 69/3. No measurable nodularity. There distance collapse in the RIGHT middle lobe which is decreased in size from comparison exam. Difficult to tell if remaining tissue is atelectasis or malignancy. LEFT lung is clear. Upper Abdomen: Limited view of the liver, kidneys, pancreas are unremarkable. Normal adrenal glands. Benign-appearing cysts of the kidneys. Musculoskeletal: No aggressive osseous lesion. IMPRESSION: 1. There is marked reduction in size of the RIGHT hilar mass. 2. There is marked decrease in the consolidative lung in the RIGHT  middle lobe and RIGHT lower lobe. Difficult to ascertain if the remaining consolidation is malignancy or postobstructive collapse. Favor postobstructive collapsed. Recommend close attention on follow-up. 3. No new mediastinal adenopathy. 4. LEFT lung clear. Electronically Signed   By: SSuzy BouchardM.D.   On: 12/26/2020 19:42   UKoreaVenous Img Lower Bilateral (DVT)  Result Date: 12/24/2020 CLINICAL DATA:  Bilateral leg pain. EXAM: BILATERAL LOWER EXTREMITY VENOUS DOPPLER ULTRASOUND TECHNIQUE: Gray-scale sonography with compression, as well as color and duplex ultrasound, were performed to evaluate the deep venous system(s) from the level of the common femoral vein through the popliteal and proximal calf veins. COMPARISON:  None. FINDINGS: VENOUS Normal compressibility of the BILATERAL common femoral, superficial femoral, and popliteal veins, as well as the visualized calf veins. THE RIGHT posterior tibial and peroneal veins are poorly visualized. Visualized portions of the BILATERAL profunda femoral veins and BILATERAL great saphenous veins are unremarkable. No filling defects to suggest DVT on grayscale or color Doppler imaging. Doppler waveforms show normal direction of venous flow, normal respiratory plasticity and response to augmentation. Limited views of the contralateral common femoral vein are unremarkable. OTHER None. Limitations: none IMPRESSION: No evidence of DVT within the RIGHT or LEFT lower extremity. Electronically Signed   By: TVirgina NorfolkM.D.   On: 12/24/2020 23:57    PERFORMANCE STATUS (ECOG) : 4 - Bedbound  Review of Systems Unable to complete  Physical Exam General: NAD Pulmonary: Unlabored Extremities: no edema, no joint deformities Skin: no rashes Neurological: Confused  IMPRESSION: Patient remains confused and unable to engage meaningfully in conversation regarding goals.  No family currently at bedside.  Patient appears clinically unchanged.  She looks comfortable  at present.  CT revealed interval improvement in cancer burden.  I called and spoke with patient's niece and updated her.  We will consult TOC to help coordinate discharge planning.  Family are likely to take patient home.  Patient would be clinically  appropriate for hospice given her high likelihood of further decline over time.  Niece plans to speak with family regarding hospice option and will let us know what they decide.  PLAN: -Continue current scope of treatment -TOC consult -Family considering home with hospice  Case and plan discussed with Dr. Grayland Ormond  Time Total: 25 minutes  Visit consisted of counseling and education dealing with the complex and emotionally intense issues of symptom management and palliative care in the setting of serious and potentially life-threatening illness.Greater than 50%  of this time was spent counseling and coordinating care related to the above assessment and plan.  Signed by: Altha Harm, PhD, NP-C

## 2020-12-27 NOTE — Progress Notes (Signed)
Physical Therapy Treatment Patient Details Name: [name] MRN: [id number] DOB: Apr 11, 1934 Today's Date: 12/27/2020    History of Present Illness Pt is an 87 y/o F admitted on 12/24/20 with c/c of progressive weakness over the last month -may be multifactorial in setting of hypokalemia and possible urinary tract infection with suprapubic tenderness and leukocytosis on UA. PMH: R breast CA stage Ia ER/PR positive, HER2 negative, stage 3a squamous cell carcinoma of L lung, HTN, CKD stage 3a, complete heart block s/p pacemaker, R mastectomy, paroxysmal a-fib not on anticoagulation, pulmonary HTN, stroke with no residual effects    PT Comments    Pt was long sitting in bed upon arriving. She is alert but only oriented to self. Has baseline cognition deficits that greatly impact session progression. Pt has flat affect and is inconsistently following simple commands. She requires a lot of assistance to progress to EOB short sit prior to requiring total assist to return and reposition in bed. Pt will need extensive PT going forward. Acute PT will continue efforts to progress pt to PLOF.    Follow Up Recommendations  SNF;Supervision/Assistance - 24 hour     Equipment Recommendations  Other (comment) (defer to next level of care)       Precautions / Restrictions Precautions Precautions: Fall Restrictions Weight Bearing Restrictions: No    Mobility  Bed Mobility Overal bed mobility: Needs Assistance Bed Mobility: Supine to Sit;Sit to Supine     Supine to sit: Max assist;Total assist;HOB elevated (of one) Sit to supine: Total assist;HOB elevated   General bed mobility comments: pt was able to progress from long sit to short sit with max/ total assist of one. in creased timwe + constant vcs for full participation    Transfers    General transfer comment: unable/unsafe to attempt       Cognition Arousal/Alertness: Awake/alert Behavior During Therapy: Flat affect Overall  Cognitive Status: History of cognitive impairments - at baseline      General Comments: pt is oriented to self. pt inconsuistently follows commands with increased time      Exercises General Exercises - Lower Extremity Ankle Circles/Pumps: 10 reps;AAROM Quad Sets: AAROM;10 reps Long Arc Quad: AAROM;5 reps Heel Slides: AAROM;Strengthening;Both;10 reps;Supine Hip ABduction/ADduction: AAROM;10 reps Straight Leg Raises: AAROM;10 reps        Pertinent Vitals/Pain Pain Assessment: Faces Faces Pain Scale: Hurts a little bit Pain Location: generalized Pain Descriptors / Indicators: Grimacing Pain Intervention(s): Limited activity within patient's tolerance;Monitored during session;Premedicated before session;Repositioned     PT Goals (current goals can now be found in the care plan section) Acute Rehab PT Goals Patient Stated Goal: none stated Progress towards PT goals: Not progressing toward goals - comment    Frequency    Min 2X/week      PT Plan Current plan remains appropriate       AM-PAC PT "6 Clicks" Mobility   Outcome Measure  Help needed turning from your back to your side while in a flat bed without using bedrails?: A Lot Help needed moving from lying on your back to sitting on the side of a flat bed without using bedrails?: Total Help needed moving to and from a bed to a chair (including a wheelchair)?: Total Help needed standing up from a chair using your arms (e.g., wheelchair or bedside chair)?: Total Help needed to walk in hospital room?: Total Help needed climbing 3-5 steps with a railing? : Total 6 Click Score: 7    End of Session  Activity Tolerance: Patient limited by fatigue   Nurse Communication: Mobility status PT Visit Diagnosis: Unsteadiness on feet (R26.81);Muscle weakness (generalized) (M62.81);Difficulty in walking, not elsewhere classified (R26.2)     Time: 2518-9842 PT Time Calculation (min) (ACUTE ONLY): 24 min  Charges:   $Therapeutic Exercise: 8-22 mins $Therapeutic Activity: 8-22 mins                     Julaine Fusi PTA 12/27/20, 1:25 PM

## 2020-12-27 NOTE — Progress Notes (Signed)
[place]  [phone] [phone]) [phone]  ID: [name] OB: 1933/10/04  MR#: [id number]  JJK#:[id number]  Patient Care Team: [facility] as PCP - General [name], MD as PCP - Cardiology (Cardiology) Theodore Demark, RN as Registered Nurse [name], MD as Consulting Physician (Cardiology)  CHIEF COMPLAINT: Lung cancer, progressive weakness and fatigue declining performance status.  INTERVAL HISTORY: Patient remains alert, but confused.  No family at bedside.  Patient appears comfortable and does not complain of any pain.  Review of systems difficult to obtain.  REVIEW OF SYSTEMS:   Review of Systems  Unable to perform ROS: Dementia    PAST MEDICAL HISTORY: Past Medical History:  Diagnosis Date   Breast cancer, right ([place]) 11/2018   Mastectomy   C. difficile colitis    CKD (chronic kidney disease), stage III ([place])    Complete heart block ([place])    a. Originally placed 08/2004; b. 07/2013 s/p MDT [place] [facility] (ser # [id number]).   Dyspnea    Gait abnormality 07/23/2016   GERD (gastroesophageal reflux disease)    Gout    Hemoptysis 07/25/2020   History of hypertension    History of stroke    Hyperhomocysteinemia ([facility])    Hyperlipidemia    Hypertension    Hypokalemia 07/13/2018   Low blood magnesium 07/13/2018   Lung cancer ([place])    Pacemaker-Medtronic 08/07/2011   PAF (paroxysmal atrial fibrillation) ([place])    a. 07/2020 Device interrogation-->13.3% AF burden-->longest atrial high rate episode 24:26 mins-->AT vs AF. Not on [place].   Pulmonary hypertension ([place])    a. 06/2018 Echo: EF 60-65%, no rwma, nl RV fxn, RVSP 48.31mmHg. Mild AoV Ca2+.   Rhabdomyolysis    Stroke [facility])    left sided weakness, 07/28/20- no residual    Vomiting 07/13/2018    PAST SURGICAL HISTORY: Past Surgical History:  Procedure Laterality Date   BIOPSY  07/15/2018   Procedure: BIOPSY;  Surgeon: [name], MD;   Location: [facility];  Service: Gastroenterology;;   BREAST BIOPSY Right 10/27/2018   affirm bx of mass, x clip,  INVASIVE MAMMARY CARCINOMA   BREAST LUMPECTOMY Right 12/03/2018   BRONCHIAL BIOPSY  07/29/2020   Procedure: BRONCHIAL BIOPSIES;  Surgeon: Laurin Coder, MD;  Location: [place];  Service: Pulmonary;;   BRONCHIAL NEEDLE ASPIRATION BIOPSY  07/29/2020   Procedure: BRONCHIAL NEEDLE ASPIRATION BIOPSIES;  Surgeon: Laurin Coder, MD;  Location: [place];  Service: Pulmonary;;   CARDIAC CATHETERIZATION     ENDOBRONCHIAL ULTRASOUND N/A 07/29/2020   Procedure: ENDOBRONCHIAL ULTRASOUND;  Surgeon: Laurin Coder, MD;  Location: [place];  Service: Pulmonary;  Laterality: N/A;   ESOPHAGOGASTRODUODENOSCOPY Left 07/15/2018   Procedure: ESOPHAGOGASTRODUODENOSCOPY (EGD);  Surgeon: [name], MD;  Location: [facility];  Service: Gastroenterology;  Laterality: Left;   HEMOSTASIS CONTROL  07/29/2020   Procedure: HEMOSTASIS CONTROL;  Surgeon: Laurin Coder, MD;  Location: [facility];  Service: Pulmonary;;   PACEMAKER INSERTION     Medtronic Enpulse dual-chamber pacemaker   PARTIAL MASTECTOMY WITH NEEDLE LOCALIZATION AND AXILLARY SENTINEL LYMPH NODE BX Right 12/03/2018   Procedure: PARTIAL MASTECTOMY WITH NEEDLE LOCALIZATION AND AXILLARY SENTINEL LYMPH NODE BX, RIGHT;  Surgeon: [name], MD;  Location: [facility] ORS;  Service: General;  Laterality: Right;   PERMANENT PACEMAKER GENERATOR CHANGE N/A 07/30/2013   Procedure: PERMANENT PACEMAKER GENERATOR CHANGE;  Surgeon: [name], MD;  Location: [facility] CATH LAB;  Service: Cardiovascular;  Laterality: N/A;   PORTA CATH INSERTION N/A 08/16/2020   Procedure: PORTA CATH INSERTION;  Surgeon: Katha Cabal, MD;  Location: [place] CV LAB;  Service: Cardiovascular;  Laterality: N/A;   VIDEO BRONCHOSCOPY N/A 07/29/2020   Procedure: VIDEO BRONCHOSCOPY WITH FLUORO;  Surgeon: Laurin Coder, MD;  Location: [facility]  ENDOSCOPY;  Service: Pulmonary;  Laterality: N/A;    FAMILY HISTORY: Family History  Problem Relation Age of Onset   Hypertension Mother    Breast cancer Sister    Breast cancer Sister    Colon cancer Neg Hx    Esophageal cancer Neg Hx     ADVANCED DIRECTIVES (Y/N):  @ADVDIR @  HEALTH MAINTENANCE: Social History   Tobacco Use   Smoking status: Former    Types: Cigarettes    Quit date: 07/25/2006    Years since quitting: 14.4   Smokeless tobacco: Never  Vaping Use   Vaping Use: Never used  Substance Use Topics   Alcohol use: No   Drug use: No     Colonoscopy:  PAP:  Bone density:  Lipid panel:  No Known Allergies  Current Facility-Administered Medications  Medication Dose Route Frequency Provider Last Rate Last Admin   acetaminophen (TYLENOL) tablet 650 mg  650 mg Oral Q6H PRN Cox, Amy N, DO       Or   acetaminophen (TYLENOL) suppository 650 mg  650 mg Rectal Q6H PRN Cox, Amy N, DO       atorvastatin (LIPITOR) tablet 20 mg  20 mg Oral QHS Cox, Amy N, DO   20 mg at 12/26/20 2202   Chlorhexidine Gluconate Cloth 2 % PADS 6 each  6 each Topical Daily Lorella Nimrod, MD   6 each at 12/27/20 1002   clopidogrel (PLAVIX) tablet 75 mg  75 mg Oral Daily Cox, Amy N, DO   75 mg at 12/27/20 1610   cyanocobalamin ((VITAMIN B-12)) injection 1,000 mcg  1,000 mcg Intramuscular Daily Lorella Nimrod, MD   1,000 mcg at 12/27/20 0953   enoxaparin (LOVENOX) injection 40 mg  40 mg Subcutaneous Q24H Cox, Amy N, DO   40 mg at 12/26/20 2202   feeding supplement (ENSURE ENLIVE / ENSURE PLUS) liquid 237 mL  237 mL Oral TID BM Cox, Amy N, DO   237 mL at 12/26/20 1408   hydrALAZINE (APRESOLINE) tablet 10 mg  10 mg Oral Q6H PRN Cox, Amy N, DO       lactated ringers 1,000 mL with potassium chloride 20 mEq infusion   Intravenous Continuous Renda Rolls, RPH 100 mL/hr at 12/27/20 0013 New Bag at 12/27/20 0013   multivitamin with minerals tablet 1 tablet  1 tablet Oral Daily Cox, Amy N, DO   1 tablet  at 12/27/20 0953   ondansetron (ZOFRAN) tablet 4 mg  4 mg Oral Q6H PRN Cox, Amy N, DO       Or   ondansetron (ZOFRAN) injection 4 mg  4 mg Intravenous Q6H PRN Cox, Amy N, DO       Facility-Administered Medications Ordered in Other Encounters  Medication Dose Route Frequency Provider Last Rate Last Admin   heparin lock flush 100 unit/mL  500 Units Intravenous Once [name], MD       sodium chloride flush (NS) 0.9 % injection 10 mL  10 mL Intravenous PRN [name], MD   10 mL at 09/13/20 0904    OBJECTIVE: Vitals:   12/27/20 0750 12/27/20 1146  BP: 119/76 (!) 107/56  Pulse: 60 60  Resp: 16   Temp: 98.6 F (37 C) 98.5 F (36.9 C)  SpO2:  97%     Body mass index is 22.48 kg/m.    ECOG FS:4 - Bedbound  General: Well-developed, well-nourished, no acute distress. Eyes: Pink conjunctiva, anicteric sclera. HEENT: Normocephalic, moist mucous membranes. Lungs: No audible wheezing or coughing. Heart: Regular rate and rhythm. Abdomen: Soft, nontender, no obvious distention. Musculoskeletal: No edema, cyanosis, or clubbing. Neuro: Alert, confused. Cranial nerves grossly intact. Skin: No rashes or petechiae noted. Psych: Normal affect.   LAB RESULTS:  Lab Results  Component Value Date   NA 135 12/26/2020   K 3.8 12/26/2020   CL 101 12/26/2020   CO2 26 12/26/2020   GLUCOSE 75 12/26/2020   BUN 17 12/26/2020   CREATININE 0.45 12/26/2020   CALCIUM 8.2 (L) 12/26/2020   PROT 6.5 12/07/2020   ALBUMIN 3.4 (L) 12/07/2020   AST 29 12/07/2020   ALT 42 12/07/2020   ALKPHOS 51 12/07/2020   BILITOT 1.2 12/07/2020   GFRNONAA >60 12/26/2020   GFRAA >60 07/24/2018    Lab Results  Component Value Date   WBC 4.3 12/26/2020   NEUTROABS 7.9 (H) 12/07/2020   HGB 10.0 (L) 12/26/2020   HCT 31.3 (L) 12/26/2020   MCV 99.4 12/26/2020   PLT 54 (L) 12/26/2020     STUDIES: DG Chest 2 View  Result Date: 12/24/2020 CLINICAL DATA:  History of lung cancer.  Increased  weakness. EXAM: CHEST - 2 VIEW COMPARISON:  X-ray dated 11/07/2020. FINDINGS: Heart size and mediastinal contours are stable. LEFT chest wall pacemaker/ICD apparatus appears grossly stable in position. RIGHT chest wall Port-A-Cath appears grossly stable in position with tip at the level of the mid/lower SVC. RIGHT basilar atelectasis, similar to earlier exams. Additional chronic scarring/atelectasis at the LEFT lung base. Study is hypoinspiratory. No confluent opacity to suggest a developing pneumonia. No pleural effusion or pneumothorax is seen. Osseous structures about the chest are unremarkable. IMPRESSION: Low lung volumes. No acute findings. No evidence of pneumonia or alveolar pulmonary edema. Electronically Signed   By: Franki Cabot M.D.   On: 12/24/2020 16:17   CT HEAD WO CONTRAST (5MM)  Result Date: 12/24/2020 CLINICAL DATA:  Altered mental status. EXAM: CT HEAD WITHOUT CONTRAST TECHNIQUE: Contiguous axial images were obtained from the base of the skull through the vertex without intravenous contrast. COMPARISON:  October 15, 2020. FINDINGS: Brain: Mild diffuse cortical atrophy is noted. Mild chronic ischemic white matter disease is noted. No mass effect or midline shift is noted. Ventricular size is within normal limits. There is no evidence of mass lesion, hemorrhage or acute infarction. Vascular: No hyperdense vessel or unexpected calcification. Skull: Normal. Negative for fracture or focal lesion. Sinuses/Orbits: No acute finding. Other: None. IMPRESSION: No acute intracranial abnormality seen. Electronically Signed   By: Marijo Conception M.D.   On: 12/24/2020 16:21   CT CHEST W CONTRAST  Result Date: 12/26/2020 CLINICAL DATA:  Non-small cell lung cancer restaging. Status post chemo radiation therapy. EXAM: CT CHEST WITH CONTRAST TECHNIQUE: Multidetector CT imaging of the chest was performed during intravenous contrast administration. CONTRAST:  31mL OMNIPAQUE IOHEXOL 350 MG/ML SOLN COMPARISON:  CT  07/26/2018 FINDINGS: Cardiovascular: Port in the anterior chest wall with tip in distal SVC. Pacer leads in the RIGHT heart. Mediastinum/Nodes: No axillary or supraclavicular adenopathy. No mediastinal or hilar adenopathy. No pericardial fluid. Esophagus normal. Lungs/Pleura: Volume loss in the RIGHT hemithorax. Interval expansion of the RIGHT lower lobe.  Interval decrease in size of RIGHT hilar mass. There is persistent consolidation in the RIGHT lower lobe (image 69/3. No measurable nodularity. There distance collapse in the RIGHT middle lobe which is decreased in size from comparison exam. Difficult to tell if remaining tissue is atelectasis or malignancy. LEFT lung is clear. Upper Abdomen: Limited view of the liver, kidneys, pancreas are unremarkable. Normal adrenal glands. Benign-appearing cysts of the kidneys. Musculoskeletal: No aggressive osseous lesion. IMPRESSION: 1. There is marked reduction in size of the RIGHT hilar mass. 2. There is marked decrease in the consolidative lung in the RIGHT middle lobe and RIGHT lower lobe. Difficult to ascertain if the remaining consolidation is malignancy or postobstructive collapse. Favor postobstructive collapsed. Recommend close attention on follow-up. 3. No new mediastinal adenopathy. 4. LEFT lung clear. Electronically Signed   By: Suzy Bouchard M.D.   On: 12/26/2020 19:42   US Venous Img Lower Bilateral (DVT)  Result Date: 12/24/2020 CLINICAL DATA:  Bilateral leg pain. EXAM: BILATERAL LOWER EXTREMITY VENOUS DOPPLER ULTRASOUND TECHNIQUE: Gray-scale sonography with compression, as well as color and duplex ultrasound, were performed to evaluate the deep venous system(s) from the level of the common femoral vein through the popliteal and proximal calf veins. COMPARISON:  None. FINDINGS: VENOUS Normal compressibility of the BILATERAL common femoral, superficial femoral, and popliteal veins, as well as the visualized calf veins. THE RIGHT posterior tibial and  peroneal veins are poorly visualized. Visualized portions of the BILATERAL profunda femoral veins and BILATERAL great saphenous veins are unremarkable. No filling defects to suggest DVT on grayscale or color Doppler imaging. Doppler waveforms show normal direction of venous flow, normal respiratory plasticity and response to augmentation. Limited views of the contralateral common femoral vein are unremarkable. OTHER None. Limitations: none IMPRESSION: No evidence of DVT within the RIGHT or LEFT lower extremity. Electronically Signed   By: Virgina Norfolk M.D.   On: 12/24/2020 23:57    ASSESSMENT: Lung cancer, progressive weakness and fatigue declining performance status.  PLAN:    1.  Lung cancer: Patient previously received maintenance immunotherapy with her last treatment approximately 3 to 4 weeks ago.  CT scan results from December 26, 2020 revealed improved disease burden.  Her performance status is declining and no further treatments are planned at this time.  Appreciate palliative care input.  Patient likely home with hospice. 2.  Anemia: Mild.  No further lab draws are necessary at this time. 3.  Thrombocytopenia: Likely multifactorial.  Typically not associated with immunotherapy.  May be nutritional.  No further lab draws. 4.  Disposition: Likely home with hospice.  Will follow.    [name], MD   12/27/2020 6:43 PM

## 2020-12-27 NOTE — Progress Notes (Signed)
PROGRESS NOTE    [name]  NUU:[id number] DOB: 1934-03-13 DOA: 12/24/2020 PCP: [facility]   Brief Narrative: Taken from H&P. [name] is a 87 y.o. 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 Patient with history of going down the hill for some time now.  Worsening dementia, mostly oriented to self only.  Poor p.o. intake. She was on immune therapy with last dose on 12/07/2020 with Dr. Grayland Ormond, unable to do the third dose due to worsening weakness.  She was referred to see palliative care at cancer center which she has not done yet.  On arrival she was hemodynamically stable.  UA concerning for UTI with some leukocytes and bacteria, no leukocytosis, no fever, unable to explain any urinary symptoms, worsening chronic thrombocytopenia.  Discussed with niece and according to her she is going down the hill.  She lives at her house with some family members staying with her and taking turns to take care of her.   She does understand that her overall prognosis is very poor.  Palliative care was also consulted and she was made DNR with current level of care. Oncology also ordered repeat CT chest and a possible repeat CT brain with contrast for restaging.  Bilateral lower extremity venous Doppler was done for some concern of leg pain and it was negative for DVT.  Patient with poor prognosis. PT is recommending SNF  placement.  Subjective: Patient was little more alert but keeps repeating the same questions.  She was able to follow some simple commands.  Assessment & Plan:   Principal Problem:   Weakness Active Problems:   Pacemaker-Medtronic   History of CVA (cerebrovascular accident)   Essential hypertension   Hyperlipidemia   Elevated troponin   Loss of weight   Hypokalemia   Malignant neoplasm of upper-outer quadrant of right breast in female, estrogen receptor positive (HCC)   Squamous cell carcinoma of right lung (HCC)   CAD (coronary artery disease)   Protein-calorie malnutrition, severe   NSTEMI (non-ST elevated myocardial infarction) [facility])   Palliative care encounter  Generalized weakness/failure to thrive.  Multifactorial with her advanced malignancy, advanced dementia and a possible UTI.  Initial urine cultures with Enterococcus faecium, pending susceptibility. Palliative care from cancer center was consulted and patient was made DNR. We will repeat imaging to see any disease progression. -Continue with ceftriaxone -PT is recommending SNF  Hypokalemia.  Resolved.  Magnesium at 1.7. -Monitor electrolytes and replete as needed.  Hypophosphatemia.  Mildly low phosphorous. -Replete phosphorus and monitor  Elevated troponin.  Most likely secondary to demand ischemia.  Mild worsening with a flat curve.  B12 deficiency.  B12 low at 125. -Start her on IM B12 replacement.  Thrombocytopenia.  Seems chronic, patient is on immune therapy.  Platelet at 54 today, no active bleeding.  Per oncology note immunotherapy should not affect the cell counts. -Oncology on board  Hypertension.  Currently blood pressure within goal. -Keep holding home diltiazem.  Paroxysmal atrial fibrillation.  Rate well controlled.  Not on any anticoagulation at home.  Hyperlipidemia. -Continue statin   Right breast cancer Squamous cell carcinoma of the right lung.  Currently on immune therapy. -Oncology  will decide about the further plan.  Objective: Vitals:   12/26/20 2136 12/27/20 0418 12/27/20 0750 12/27/20 1146  BP: (!) 117/57 117/64 119/76 (!) 107/56  Pulse: (!) 59 60 60 60  Resp: _0 Temp: 99.7 F (37.6 C) 98.2 F (36.8 C) 98.6 F (37 C) 98.5 F (36.9 C)  TempSrc:    Oral  SpO2: 98% 99%  97%  Weight:      Height:        Intake/Output Summary (Last 24 hours) at 12/27/2020 1513 Last data filed at 12/27/2020 0400 Gross per 24 hour  Intake 3060.24 ml  Output --  Net 3060.24 ml    Filed Weights   12/24/20 1507 12/25/20 2125  Weight: 47.6 kg 59.4 kg    Examination:  General.  Chronically ill-appearing elderly lady, in no acute distress. Pulmonary.  Lungs clear bilaterally, normal respiratory effort. CV.  Regular rate and rhythm, no JVD, rub or murmur. Abdomen.  Soft, nontender, nondistended, BS positive. CNS.  Alert and able to follow some simple commands..  No focal neurologic deficit. Extremities.  No edema, no cyanosis, pulses intact and symmetrical. Psychiatry.  Judgment and insight appears impaired.   DVT prophylaxis: Lovenox Code Status: Full Family Communication:  Disposition Plan:  Status is: Inpatient  Remains inpatient appropriate because:Inpatient level of care appropriate due to severity of illness  Dispo: The patient is from: Home              Anticipated d/c is to: Home              Patient currently is not medically stable to d/c.   Difficult to place patient No             Level of care: Med-Surg  All the records are reviewed and case discussed with Care Management/Social Worker. Management plans discussed with the patient, nursing and they are in agreement.  Consultants:  Oncology Palliative care  Procedures:  Antimicrobials:  Ceftriaxone  Data Reviewed: I have personally reviewed following labs and imaging studies  CBC: Recent Labs  Lab 12/24/20 1510 12/25/20 0535 12/26/20 0421  WBC 6.3 5.6 4.3  HGB 13.0 11.2* 10.0*   HCT 38.2 33.9* 31.3*  MCV 97.2 98.5 99.4  PLT PLATELET CLUMPS NOTED ON SMEAR, UNABLE TO ESTIMATE 55* 54*    Basic Metabolic Panel: Recent Labs  Lab 12/24/20 1510 12/24/20 2033 12/25/20 0535 12/26/20 0421  NA 135 137 137 135  K 3.2* 3.0* 4.1 3.8  CL 97* 99 101 101  CO2 _1 GLUCOSE 151* 106* 107* 75  BUN 35* 32* 24* 17  CREATININE 0.78 0.69 0.51 0.45  CALCIUM 8.6* 8.5* 8.2* 8.2*  MG 1.7  --   --  1.7  PHOS  --   --   --  2.4*    GFR: Estimated Creatinine Clearance: 44.4 mL/min (by C-G formula based on SCr of 0.45 mg/dL). Liver Function Tests: No results for input(s): AST, ALT, ALKPHOS, BILITOT, PROT, ALBUMIN in the last 168 hours. No results for input(s): LIPASE, AMYLASE in the last 168 hours. Recent Labs  Lab 12/24/20 2331  AMMONIA 17    Coagulation Profile: No results for input(s): INR, PROTIME in the last 168 hours. Cardiac Enzymes: No results for input(s): CKTOTAL, CKMB, CKMBINDEX, TROPONINI in the last 168 hours. BNP (last 3 results) No  results for input(s): PROBNP in the last 8760 hours. HbA1C: No results for input(s): HGBA1C in the last 72 hours. CBG: Recent Labs  Lab 12/24/20 2046  GLUCAP 103*    Lipid Profile: No results for input(s): CHOL, HDL, LDLCALC, TRIG, CHOLHDL, LDLDIRECT in the last 72 hours. Thyroid Function Tests: No results for input(s): TSH, T4TOTAL, FREET4, T3FREE, THYROIDAB in the last 72 hours.  Anemia Panel: Recent Labs    12/24/20 2331  VITAMINB12 125*    Sepsis Labs: No results for input(s): PROCALCITON, LATICACIDVEN in the last 168 hours.  Recent Results (from the past 240 hour(s))  Resp Panel by RT-PCR (Flu A&B, Covid) Nasopharyngeal Swab     Status: None   Collection Time: 12/24/20  4:57 PM   Specimen: Nasopharyngeal Swab; Nasopharyngeal(NP) swabs in vial transport medium  Result Value Ref Range Status   SARS Coronavirus 2 by RT PCR NEGATIVE NEGATIVE Final    Comment: (NOTE) SARS-CoV-2 target nucleic acids  are NOT DETECTED.  The SARS-CoV-2 RNA is generally detectable in upper respiratory specimens during the acute phase of infection. The lowest concentration of SARS-CoV-2 viral copies this assay can detect is 138 copies/mL. A negative result does not preclude SARS-Cov-2 infection and should not be used as the sole basis for treatment or other patient management decisions. A negative result may occur with  improper specimen collection/handling, submission of specimen other than nasopharyngeal swab, presence of viral mutation(s) within the areas targeted by this assay, and inadequate number of viral copies(<138 copies/mL). A negative result must be combined with clinical observations, patient history, and epidemiological information. The expected result is Negative.  Fact Sheet for Patients:  EntrepreneurPulse.com.au  Fact Sheet for Healthcare Providers:  IncredibleEmployment.be  This test is no t yet approved or cleared by the [place] FDA and  has been authorized for detection and/or diagnosis of SARS-CoV-2 by FDA under an Emergency Use Authorization (EUA). This EUA will remain  in effect (meaning this test can be used) for the duration of the COVID-19 declaration under Section 564(b)(1) of the Act, 21 U.S.C.section 360bbb-3(b)(1), unless the authorization is terminated  or revoked sooner.       Influenza A by PCR NEGATIVE NEGATIVE Final   Influenza B by PCR NEGATIVE NEGATIVE Final    Comment: (NOTE) The Xpert Xpress SARS-CoV-2/FLU/RSV plus assay is intended as an aid in the diagnosis of influenza from Nasopharyngeal swab specimens and should not be used as a sole basis for treatment. Nasal washings and aspirates are unacceptable for Xpert Xpress SARS-CoV-2/FLU/RSV testing.  Fact Sheet for Patients: EntrepreneurPulse.com.au  Fact Sheet for Healthcare Providers: IncredibleEmployment.be  This test is  not yet approved or cleared by the [place] FDA and has been authorized for detection and/or diagnosis of SARS-CoV-2 by FDA under an Emergency Use Authorization (EUA). This EUA will remain in effect (meaning this test can be used) for the duration of the COVID-19 declaration under Section 564(b)(1) of the Act, 21 U.S.C. section 360bbb-3(b)(1), unless the authorization is terminated or revoked.  Performed at [facility], [address] [place], [place] [address]   Urine Culture     Status: Abnormal (Preliminary result)   Collection Time: 12/24/20  5:50 PM   Specimen: Urine, Random  Result Value Ref Range Status   Specimen Description   Final    URINE, RANDOM Performed at [facility], [address] [place], [place] [address]    Special Requests   Final    NONE Performed at [facility]  Lab, [place], [place] [address]    Culture (A)  Final    30,000 COLONIES/mL ENTEROCOCCUS FAECIUM SUSCEPTIBILITIES TO FOLLOW >=100,000 COLONIES/mL DIPHTHEROIDS(CORYNEBACTERIUM SPECIES) Standardized susceptibility testing for this organism is not available. Performed at [facility], [place] [address] [place], [place] [address]    Report Status PENDING  Incomplete      Radiology Studies: CT CHEST W CONTRAST  Result Date: 12/26/2020 CLINICAL DATA:  Non-small cell lung cancer restaging. Status post chemo radiation therapy. EXAM: CT CHEST WITH CONTRAST TECHNIQUE: Multidetector CT imaging of the chest was performed during intravenous contrast administration. CONTRAST:  39m OMNIPAQUE IOHEXOL 350 MG/ML SOLN COMPARISON:  CT 07/26/2018 FINDINGS: Cardiovascular: Port in the anterior chest wall with tip in distal SVC. Pacer leads in the RIGHT heart. Mediastinum/Nodes: No axillary or supraclavicular adenopathy. No mediastinal or hilar adenopathy. No pericardial fluid. Esophagus normal. Lungs/Pleura: Volume loss in the RIGHT hemithorax. Interval expansion of  the RIGHT lower lobe. Interval decrease in size of RIGHT hilar mass. There is persistent consolidation in the RIGHT lower lobe (image 69/3. No measurable nodularity. There distance collapse in the RIGHT middle lobe which is decreased in size from comparison exam. Difficult to tell if remaining tissue is atelectasis or malignancy. LEFT lung is clear. Upper Abdomen: Limited view of the liver, kidneys, pancreas are unremarkable. Normal adrenal glands. Benign-appearing cysts of the kidneys. Musculoskeletal: No aggressive osseous lesion. IMPRESSION: 1. There is marked reduction in size of the RIGHT hilar mass. 2. There is marked decrease in the consolidative lung in the RIGHT middle lobe and RIGHT lower lobe. Difficult to ascertain if the remaining consolidation is malignancy or postobstructive collapse. Favor postobstructive collapsed. Recommend close attention on follow-up. 3. No new mediastinal adenopathy. 4. LEFT lung clear. Electronically Signed   By: SSuzy BouchardM.D.   On: 12/26/2020 19:42    Scheduled Meds:  atorvastatin  20 mg Oral QHS   Chlorhexidine Gluconate Cloth  6 each Topical Daily   clopidogrel  75 mg Oral Daily   cyanocobalamin  1,000 mcg Intramuscular Daily   enoxaparin (LOVENOX) injection  40 mg Subcutaneous Q24H   feeding supplement  237 mL Oral TID BM   multivitamin with minerals  1 tablet Oral Daily   Continuous Infusions:  lactated ringers with kcl 100 mL/hr at 12/27/20 0013     LOS: 2 days   Time spent: 30 minutes More than 50% of the time was spent in counseling/coordination of care  SLorella Nimrod MD [facility]  If 7PM-7AM, please contact night-coverage [email or web address]  12/27/2020, 3:13 PM   This record has been created using [employer] analyst Errors have been sought and corrected,but may not always be located. Such creation errors do not reflect on the standard of care.

## 2020-12-28 ENCOUNTER — Inpatient Hospital Stay: Payer: Medicare HMO

## 2020-12-28 ENCOUNTER — Inpatient Hospital Stay: Payer: Medicare HMO | Admitting: Oncology

## 2020-12-28 ENCOUNTER — Inpatient Hospital Stay: Payer: Medicare HMO | Admitting: Hospice and Palliative Medicine

## 2020-12-28 DIAGNOSIS — C50411 Malignant neoplasm of upper-outer quadrant of right female breast: Secondary | ICD-10-CM

## 2020-12-28 LAB — CBC
HCT: 28.3 % — ABNORMAL LOW (ref 36.0–46.0)
Hemoglobin: 9.2 g/dL — ABNORMAL LOW (ref 12.0–15.0)
MCH: 32.6 pg (ref 26.0–34.0)
MCHC: 32.5 g/dL (ref 30.0–36.0)
MCV: 100.4 fL — ABNORMAL HIGH (ref 80.0–100.0)
Platelets: 59 10*3/uL — ABNORMAL LOW (ref 150–400)
RBC: 2.82 MIL/uL — ABNORMAL LOW (ref 3.87–5.11)
RDW: 17 % — ABNORMAL HIGH (ref 11.5–15.5)
WBC: 4.7 10*3/uL (ref 4.0–10.5)
nRBC: 0 % (ref 0.0–0.2)

## 2020-12-28 LAB — URINE CULTURE: Culture: 30000 — AB

## 2020-12-28 LAB — RENAL FUNCTION PANEL
Albumin: 1.9 g/dL — ABNORMAL LOW (ref 3.5–5.0)
Anion gap: 8 (ref 5–15)
BUN: 12 mg/dL (ref 8–23)
CO2: 25 mmol/L (ref 22–32)
Calcium: 7.9 mg/dL — ABNORMAL LOW (ref 8.9–10.3)
Chloride: 104 mmol/L (ref 98–111)
Creatinine, Ser: <0.3 mg/dL — ABNORMAL LOW (ref 0.44–1.00)
Glucose, Bld: 60 mg/dL — ABNORMAL LOW (ref 70–99)
Phosphorus: 2.9 mg/dL (ref 2.5–4.6)
Potassium: 3.7 mmol/L (ref 3.5–5.1)
Sodium: 137 mmol/L (ref 135–145)

## 2020-12-28 NOTE — Progress Notes (Signed)
Physical Therapy Discharge Patient Details Name: [name] MRN: [id number] DOB: 1933/06/18 Today's Date: 12/28/2020 Time: 1150am     Patient discharged from PT services secondary to MD reports pt is going comfort measure. Will complete orders per MD request.   Please see latest therapy progress note for current level of functioning and progress toward goals.    Progress and discharge plan discussed with patient and/or caregiver: Patient unable to participate in discharge planning and no caregivers available  GP     Julaine Fusi PTA 12/28/20, 11:54 AM

## 2020-12-28 NOTE — Progress Notes (Signed)
I spoke with patient's niece.  She reports that family has decided to pursue hospice at home.  We will consult the hospice liaison to coordinate.  Signed by: Altha Harm, PhD, NP-C

## 2020-12-28 NOTE — Progress Notes (Signed)
PROGRESS NOTE    [name]  KYH:[id number] DOB: July 19, 1933 DOA: 12/24/2020 PCP: [facility] [place]    Chief Complaint  Patient presents with   Weakness    Brief Narrative:  H/o paroxysmal A. fib not on anticoagulation, third-degree heart block status post pacemaker, history of CVA with vascular dementia, right breast cancer status post right mastectomy, history of stage III squamous cell carcinoma of left lung recently taken off immunotherapy due to poor performance status, presented to the hospital due to weakness  Plan to go home with home hospice once equipment delivered  Subjective:  She is alert, answer simple questions, denies pain  Assessment & Plan:   Principal Problem:   Weakness Active Problems:   Pacemaker-[employer]   History of CVA (cerebrovascular accident)   Essential hypertension   Hyperlipidemia   Elevated troponin   Loss of weight   Hypokalemia   Malignant neoplasm of upper-outer quadrant of right breast in female, estrogen receptor positive (HCC)   Squamous cell carcinoma of right lung (HCC)   CAD (coronary artery disease)   Protein-calorie malnutrition, severe   NSTEMI (non-ST elevated myocardial infarction) [facility])   Palliative care encounter   Generalized weakness/failure to thrive -Likely from advanced malignancy, advanced dementia -Urine culture is not consistent with UTI, she was on Rocephin which is stopped -Plan to discharge home with home hospice once equipment delivered  Hypokalemia/hypomagnesemia replaced and improved  Troponin elevation not consistent with ACS likely demand ischemia  B12 deficiency likely due to poor oral intake Received IV B12 injection in hospital  History of PAF.  Not a candidate for anticoagulation Currently rate controlled without medication bp low normal  Thrombocytopenia/anemia In the setting of malignancy Home with home hospice tomorrow    Body mass index is 22.48 kg/m.[name]      Unresulted Labs (From admission, onward)    None         DVT prophylaxis: enoxaparin (LOVENOX) injection 40 mg Start: 12/24/20 2300 Place TED hose Start: 12/24/20 2213   Code Status: DNR Family Communication: None at bedside Disposition:   Status is: Inpatient   Dispo: The patient is from: Home              Anticipated d/c is to: Home with home hospice              Anticipated d/c date is: Tomorrow,Plan to go home with home hospice once equipment delivered                Consultants:  Palliative care/hospice Oncology  Procedures:  None  Antimicrobials:   Anti-infectives (From admission, onward)    Start     Dose/Rate Route Frequency Ordered Stop   12/24/20 2245  cefTRIAXone (ROCEPHIN) 1 g in sodium chloride 0.9 % 100 mL IVPB        1 g 200 mL/hr over 30 Minutes Intravenous Every morning 12/24/20 2235 12/27/20 1857           Objective: Vitals:   12/27/20 2327 12/28/20 0518 12/28/20 0821 12/28/20 1610  BP: 103/86 104/90 100/75 (!) 99/51  Pulse: 65 97 60 (!) 59  Resp: 18 16 16 16   Temp: 100.3 F (37.9 C) 98.4 F (36.9 C) 98.2 F (36.8 C) 98.2 F (36.8 C)  TempSrc:  Oral Oral   SpO2: 93%  94% 98%  Weight:      Height:        Intake/Output Summary (Last 24 hours) at 12/28/2020 1645 Last data filed at 12/28/2020  1402 Gross per 24 hour  Intake 0 ml  Output 550 ml  Net -550 ml   Filed Weights   12/24/20 1507 12/25/20 2125  Weight: 47.6 kg 59.4 kg    Examination:  General exam: Frail, chronically ill-appearing, answer simple questions, follows simple commands Respiratory system: Clear to auscultation. Respiratory effort normal. Cardiovascular system: S1 & S2 heard, RRR.  No pedal edema. Gastrointestinal system: Abdomen is nondistended, soft and nontender. Normal bowel sounds heard. Central nervous system: Alert and oriented to self Extremities: No edema Skin: No rashes, lesions or ulcers Psychiatry: Calm and cooperative.     Data  Reviewed: I have personally reviewed following labs and imaging studies  CBC: Recent Labs  Lab 12/24/20 1510 12/25/20 0535 12/26/20 0421 12/28/20 0612  WBC 6.3 5.6 4.3 4.7  HGB 13.0 11.2* 10.0* 9.2*  HCT 38.2 33.9* 31.3* 28.3*  MCV 97.2 98.5 99.4 100.4*  PLT PLATELET CLUMPS NOTED ON SMEAR, UNABLE TO ESTIMATE 55* 54* 59*    Basic Metabolic Panel: Recent Labs  Lab 12/24/20 1510 12/24/20 2033 12/25/20 0535 12/26/20 0421 12/28/20 0612  NA 135 137 137 135 137  K 3.2* 3.0* 4.1 3.8 3.7  CL 97* 99 101 101 104  CO2 22 26 27 26 25   GLUCOSE 151* 106* 107* 75 60*  BUN 35* 32* 24* 17 12  CREATININE 0.78 0.69 0.51 0.45 <0.30*  CALCIUM 8.6* 8.5* 8.2* 8.2* 7.9*  MG 1.7  --   --  1.7  --   PHOS  --   --   --  2.4* 2.9    GFR: CrCl cannot be calculated (This lab value cannot be used to calculate CrCl because it is not a number: <0.30).  Liver Function Tests: Recent Labs  Lab 12/28/20 0612  ALBUMIN 1.9*    CBG: Recent Labs  Lab 12/24/20 2046  GLUCAP 103*     Recent Results (from the past 240 hour(s))  Resp Panel by RT-PCR (Flu A&B, Covid) Nasopharyngeal Swab     Status: None   Collection Time: 12/24/20  4:57 PM   Specimen: Nasopharyngeal Swab; Nasopharyngeal(NP) swabs in vial transport medium  Result Value Ref Range Status   SARS Coronavirus 2 by RT PCR NEGATIVE NEGATIVE Final    Comment: (NOTE) SARS-CoV-2 target nucleic acids are NOT DETECTED.  The SARS-CoV-2 RNA is generally detectable in upper respiratory specimens during the acute phase of infection. The lowest concentration of SARS-CoV-2 viral copies this assay can detect is 138 copies/mL. A negative result does not preclude SARS-Cov-2 infection and should not be used as the sole basis for treatment or other patient management decisions. A negative result may occur with  improper specimen collection/handling, submission of specimen other than nasopharyngeal swab, presence of viral mutation(s) within the areas  targeted by this assay, and inadequate number of viral copies(<138 copies/mL). A negative result must be combined with clinical observations, patient history, and epidemiological information. The expected result is Negative.  Fact Sheet for Patients:  EntrepreneurPulse.com.au  Fact Sheet for Healthcare Providers:  IncredibleEmployment.be  This test is no t yet approved or cleared by the [place] FDA and  has been authorized for detection and/or diagnosis of SARS-CoV-2 by FDA under an Emergency Use Authorization (EUA). This EUA will remain  in effect (meaning this test can be used) for the duration of the COVID-19 declaration under Section 564(b)(1) of the Act, 21 U.S.C.section 360bbb-3(b)(1), unless the authorization is terminated  or revoked sooner.       Influenza A  by PCR NEGATIVE NEGATIVE Final   Influenza B by PCR NEGATIVE NEGATIVE Final    Comment: (NOTE) The Xpert Xpress SARS-CoV-2/FLU/RSV plus assay is intended as an aid in the diagnosis of influenza from Nasopharyngeal swab specimens and should not be used as a sole basis for treatment. Nasal washings and aspirates are unacceptable for Xpert Xpress SARS-CoV-2/FLU/RSV testing.  Fact Sheet for Patients: EntrepreneurPulse.com.au  Fact Sheet for Healthcare Providers: IncredibleEmployment.be  This test is not yet approved or cleared by the [place] FDA and has been authorized for detection and/or diagnosis of SARS-CoV-2 by FDA under an Emergency Use Authorization (EUA). This EUA will remain in effect (meaning this test can be used) for the duration of the COVID-19 declaration under Section 564(b)(1) of the Act, 21 U.S.C. section 360bbb-3(b)(1), unless the authorization is terminated or revoked.  Performed at [facility], [address] [place], [place] [address]   Urine Culture     Status: Abnormal   Collection Time:  12/24/20  5:50 PM   Specimen: Urine, Random  Result Value Ref Range Status   Specimen Description   Final    URINE, RANDOM Performed at [facility], [address], [place], [place] [address]    Special Requests   Final    NONE Performed at [facility], [place], [place] [address]    Culture (A)  Final    30,000 COLONIES/mL ENTEROCOCCUS FAECALIS >=100,000 COLONIES/mL DIPHTHEROIDS(CORYNEBACTERIUM SPECIES) Standardized susceptibility testing for this organism is not available. Performed at [facility], [place] [address], [place], [place] [address]    Report Status 12/28/2020 FINAL  Final   Organism ID, Bacteria ENTEROCOCCUS FAECALIS (A)  Final      Susceptibility   Enterococcus faecalis - MIC*    AMPICILLIN >=32 RESISTANT Resistant     NITROFURANTOIN 256 RESISTANT Resistant     VANCOMYCIN <=0.5 SENSITIVE Sensitive     * 30,000 COLONIES/mL ENTEROCOCCUS FAECALIS         Radiology Studies: CT CHEST W CONTRAST  Result Date: 12/26/2020 CLINICAL DATA:  Non-small cell lung cancer restaging. Status post chemo radiation therapy. EXAM: CT CHEST WITH CONTRAST TECHNIQUE: Multidetector CT imaging of the chest was performed during intravenous contrast administration. CONTRAST:  29mL OMNIPAQUE IOHEXOL 350 MG/ML SOLN COMPARISON:  CT 07/26/2018 FINDINGS: Cardiovascular: Port in the anterior chest wall with tip in distal SVC. Pacer leads in the RIGHT heart. Mediastinum/Nodes: No axillary or supraclavicular adenopathy. No mediastinal or hilar adenopathy. No pericardial fluid. Esophagus normal. Lungs/Pleura: Volume loss in the RIGHT hemithorax. Interval expansion of the RIGHT lower lobe. Interval decrease in size of RIGHT hilar mass. There is persistent consolidation in the RIGHT lower lobe (image 69/3. No measurable nodularity. There distance collapse in the RIGHT middle lobe which is decreased in size from comparison exam. Difficult to tell if remaining tissue  is atelectasis or malignancy. LEFT lung is clear. Upper Abdomen: Limited view of the liver, kidneys, pancreas are unremarkable. Normal adrenal glands. Benign-appearing cysts of the kidneys. Musculoskeletal: No aggressive osseous lesion. IMPRESSION: 1. There is marked reduction in size of the RIGHT hilar mass. 2. There is marked decrease in the consolidative lung in the RIGHT middle lobe and RIGHT lower lobe. Difficult to ascertain if the remaining consolidation is malignancy or postobstructive collapse. Favor postobstructive collapsed. Recommend close attention on follow-up. 3. No new mediastinal adenopathy. 4. LEFT lung clear. Electronically Signed   By: Suzy Bouchard M.D.   On: 12/26/2020 19:42  Scheduled Meds:  atorvastatin  20 mg Oral QHS   Chlorhexidine Gluconate Cloth  6 each Topical Daily   clopidogrel  75 mg Oral Daily   cyanocobalamin  1,000 mcg Intramuscular Daily   enoxaparin (LOVENOX) injection  40 mg Subcutaneous Q24H   feeding supplement  237 mL Oral TID BM   multivitamin with minerals  1 tablet Oral Daily   Continuous Infusions:  lactated ringers with kcl 100 mL/hr at 12/27/20 0013     LOS: 3 days   Time spent: 25 mins Greater than 50% of this time was spent in counseling, explanation of diagnosis, planning of further management, and coordination of care.   Voice Recognition Viviann Spare dictation system was used to create this note, attempts have been made to correct errors. Please contact the author with questions and/or clarifications.   Florencia Reasons, MD PhD FACP [facility]  Available via Epic secure chat 7am-7pm for nonurgent issues Please page for urgent issues To page the attending provider between 7A-7P or the covering provider during after hours 7P-7A, please log into the web site [email or web address] and access using universal [place] password for that web site. If you do not have the password, please call the hospital operator.    12/28/2020, 4:45 PM

## 2020-12-28 NOTE — TOC Progression Note (Signed)
Transition of Care [facility]) - Progression Note    Patient Details  Name: [name] MRN: [id number] Date of Birth: 1933-10-02  Transition of Care [facility]) CM/SW [place], RN Phone Number: 12/28/2020, 1:39 PM  Clinical Narrative:  [facility] RN will discuss Hospice at home services with family.           Expected Discharge Plan and Services                                                 Social Determinants of Health (SDOH) Interventions    Readmission Risk Interventions No flowsheet data found.

## 2020-12-28 NOTE — Progress Notes (Addendum)
[facility]) Hospital Liaison Note  Received request from Donnetta Hutching, RN [facility] Manager for hospice services at home after discharge. Chart and patient information reviewed by [facility] physician. Hospice eligibility confirmed.  Spoke with niece, Grayland Jack to initiate education related to hospice philosophy, services and team approach to care. Patient/family verbalized understanding of information provided. Per discussion, the plan is for possible discharge via EMS on 9.1.22.  DME needs discussed. Patient has the following equipment in the home: O2, hospital bed, w/c, BSC, walker. Patient/family requests the following equipment for delivery: OBT. Address has been verified and is correct in the chart. Grayland Jack is the family contact to arrange time of equipment delivery.   Please send signed and completed DNR home with patient/family. Please provide prescriptions at discharge as needed to ensure ongoing symptom management.   [facility] information and contact numbers given to Grayland Jack. Above information shared with   [place].   Please do not hesitate to call with any hospice related questions or concerns.   Thank you for the opportunity to participate in this patient's care.   Bobbie "Loren Racer, RN, BSN [facility] Liaison [phone]

## 2020-12-29 MED ORDER — DILTIAZEM HCL ER COATED BEADS 120 MG PO CP24: 120.0000 mg | ORAL_CAPSULE | Freq: Every day | ORAL | 0 refills | Status: AC | PRN

## 2020-12-29 MED ORDER — HEPARIN SOD (PORK) LOCK FLUSH 100 UNIT/ML IV SOLN
500.0000 [IU] | Freq: Once | INTRAVENOUS | Status: AC
  Administered 2020-12-29: 500 [IU] via INTRAVENOUS
  Filled 2020-12-29: qty 5

## 2020-12-29 NOTE — Progress Notes (Signed)
I spoke with patient's sister, Vivien Rota, and educated her about discharge instructions.  She states understanding and states that family is ready for her return.  AVS in d/c packet and will be given to EMS for transport.

## 2020-12-29 NOTE — Discharge Summary (Signed)
Discharge Summary  [name] ZLD:[id number] DOB: April 03, 1934  PCP: [facility] [place] date: 12/24/2020 Discharge date: 12/29/2020  Time spent: 30mns, more than 50% time spent on coordination of care.  Recommendations for Outpatient Follow-up:  Home with home hospice   Discharge Diagnoses:  Active Hospital Problems   Diagnosis Date Noted   Weakness 10/15/2020   Palliative care encounter    NSTEMI (non-ST elevated myocardial infarction) ([facility] 11/07/2020   Protein-calorie malnutrition, severe 10/17/2020   Squamous cell carcinoma of right lung ([facility] 08/05/2020   Malignant neoplasm of upper-outer quadrant of right breast in female, estrogen receptor positive ([facility] 10/31/2018   Hypokalemia 07/13/2018   Loss of weight 06/27/2018   Elevated troponin 05/26/2018   History of CVA (cerebrovascular accident) 05/26/2018   Hyperlipidemia 05/26/2018   CAD (coronary artery disease) 06/11/2016   Essential hypertension 06/11/2016   Pacemaker-Medtronic 08/07/2011    Resolved Hospital Problems  No resolved problems to display.    Discharge Condition: stable  Diet recommendation: Comfort feeds  Filed Weights   12/24/20 1507 12/25/20 2125  Weight: 47.6 kg 59.4 kg    History of present illness: ( per admitting provider [name] Chief Concern: Weakness   HPI: Suzanne CARLENIS BLAYDESis a 87y.o. 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   At bedside,  she was able to tell me her full name, 87year old, and that she is in the hospital. She states the year is 2020 and that RCrowleyis the president.   Her sister states she has been getting progressively weaker over the last month. Patient has had decreased PO intake 12/23/20 and today, patient didn't want to eat her breakfast (oatmeal - her usual preferred) and poor PO fluid. She only drank enough to take her AM medications. Sister called Suzanne. FGrayland Ormondand was advised to come to the ED for further evaluations.    Per sister, patient did not complain of anything. She endorsed to her sister that both her legs hurt. She's had increased urination.   She asked why is she in here, she states she feels fine. She states she lives with her momma.   Hospital Course:  Principal Problem:   Weakness Active Problems:   Pacemaker-Medtronic   History of CVA (cerebrovascular accident)   Essential hypertension   Hyperlipidemia   Elevated troponin   Loss of weight   Hypokalemia   Malignant neoplasm of upper-outer quadrant of right breast in female, estrogen receptor positive (HCC)   Squamous cell carcinoma of right lung (HCC)   CAD (coronary artery disease)   Protein-calorie malnutrition, severe   NSTEMI (non-ST elevated myocardial infarction) ([facility]   Palliative care encounter   Generalized weakness/failure to thrive -Likely from advanced malignancy, advanced dementia -Urine culture is not consistent with UTI, she was on Rocephin which is stopped -discharge home with home hospice once equipment delivered   Hypokalemia/hypomagnesemia replaced and improved   Troponin elevation not consistent with ACS likely demand ischemia   B12 deficiency likely due to poor oral intake Received IV B12 injection in  hospital   History of PAF.  Not a candidate for anticoagulation Currently rate controlled without medication bp low normal Cardizem changed to as needed for tachycardia/ high blood pressure    Thrombocytopenia/anemia In the setting of malignancy D/c plavix Home with home hospice       Body mass index is 22.48 kg/m.[name]  Consultants:  Palliative care/hospice Oncology   Procedures:  None   Discharge Exam: BP (!) 127/94 (BP Location: Right Arm)   Pulse (!) 59   Temp 99.7 F (37.6 C)   Resp 16   Ht 5' 4"  (1.626 m)   Wt 59.4 kg   SpO2 95%   BMI 22.48 kg/m   General exam: Frail, chronically ill-appearing, answer simple questions, follows simple commands Respiratory system: Clear to auscultation. Respiratory effort normal. Cardiovascular system: S1 & S2 heard, RRR.  No pedal edema. Gastrointestinal system: Abdomen is nondistended, soft and nontender. Normal bowel sounds heard. Central nervous system: Alert and oriented to self Extremities: No edema Skin: No rashes, lesions or ulcers Psychiatry: Calm and cooperative    Discharge Instructions You were cared for by a hospitalist during your hospital stay. If you have any questions about your discharge medications or the care you received while you were in the hospital after you are discharged, you can call the unit and asked to speak with the hospitalist on call if the hospitalist that took care of you is not available. Once you are discharged, your primary care physician will handle any further medical issues. Please note that NO REFILLS for any discharge medications will be authorized once you are discharged, as it is imperative that you return to your primary care physician (or establish a relationship with a primary care physician if you do not have one) for your aftercare needs so that they can reassess your need for medications and monitor your lab values.  Discharge Instructions     Diet general   Complete by: As directed    Comfort feeds   Increase activity slowly   Complete by: As directed       Allergies as of 12/29/2020   No Known Allergies      Medication List     STOP taking these medications     alendronate 70 MG tablet Commonly known as: FOSAMAX   allopurinol 100 MG tablet Commonly known as: ZYLOPRIM   atorvastatin 20 MG tablet Commonly known as: Lipitor   Blood Pressure Kit   calcium-vitamin D 250-100 MG-UNIT tablet   clopidogrel 75 MG tablet Commonly known as: PLAVIX   dexamethasone 4 MG tablet Commonly known as: DECADRON   dextromethorphan-guaiFENesin 30-600 MG 12hr tablet Commonly known as: MUCINEX DM   donepezil 5 MG tablet Commonly known as: ARICEPT   letrozole 2.5 MG tablet Commonly known as: FEMARA   magnesium hydroxide 400 MG/5ML suspension Commonly known as: MILK OF MAGNESIA   multivitamin with minerals Tabs tablet   simvastatin 40 MG tablet Commonly known as: ZOCOR   triamcinolone ointment 0.5 % Commonly known as: KENALOG       TAKE these medications    acetaminophen 500 MG tablet Commonly known as: TYLENOL Take 500-1,000 mg by mouth every 6 (six) hours as needed for moderate pain.   diltiazem 120 MG 24 hr capsule Commonly known as: CARDIZEM CD Take 1 capsule (120 mg total) by mouth daily as needed. For tachycardia or high blood pressure What changed:  when to take this reasons to take this additional instructions   docusate sodium 100  MG capsule Commonly known as: COLACE Take 100 mg by mouth 2 (two) times daily as needed for mild constipation.   feeding supplement Liqd Take 237 mLs by mouth 3 (three) times daily between meals.   Lumigan 0.01 % Soln Generic drug: bimatoprost Place 1 drop into both eyes at bedtime.   Magnesium Oxide 250 MG Tabs Take 250 mg by mouth daily.   mirtazapine 15 MG tablet Commonly known as: REMERON Take 15 mg by mouth at bedtime.   pantoprazole 40 MG tablet Commonly known as: PROTONIX Take 40 mg by mouth daily.       No Known Allergies    The results of significant diagnostics from this hospitalization (including imaging, microbiology, ancillary and laboratory) are listed below for  reference.    Significant Diagnostic Studies: DG Chest 2 View  Result Date: 12/24/2020 CLINICAL DATA:  History of lung cancer.  Increased weakness. EXAM: CHEST - 2 VIEW COMPARISON:  X-ray dated 11/07/2020. FINDINGS: Heart size and mediastinal contours are stable. LEFT chest wall pacemaker/ICD apparatus appears grossly stable in position. RIGHT chest wall Port-A-Cath appears grossly stable in position with tip at the level of the mid/lower SVC. RIGHT basilar atelectasis, similar to earlier exams. Additional chronic scarring/atelectasis at the LEFT lung base. Study is hypoinspiratory. No confluent opacity to suggest a developing pneumonia. No pleural effusion or pneumothorax is seen. Osseous structures about the chest are unremarkable. IMPRESSION: Low lung volumes. No acute findings. No evidence of pneumonia or alveolar pulmonary edema. Electronically Signed   By: Franki Cabot M.D.   On: 12/24/2020 16:17   CT HEAD WO CONTRAST (5MM)  Result Date: 12/24/2020 CLINICAL DATA:  Altered mental status. EXAM: CT HEAD WITHOUT CONTRAST TECHNIQUE: Contiguous axial images were obtained from the base of the skull through the vertex without intravenous contrast. COMPARISON:  October 15, 2020. FINDINGS: Brain: Mild diffuse cortical atrophy is noted. Mild chronic ischemic white matter disease is noted. No mass effect or midline shift is noted. Ventricular size is within normal limits. There is no evidence of mass lesion, hemorrhage or acute infarction. Vascular: No hyperdense vessel or unexpected calcification. Skull: Normal. Negative for fracture or focal lesion. Sinuses/Orbits: No acute finding. Other: None. IMPRESSION: No acute intracranial abnormality seen. Electronically Signed   By: Marijo Conception M.D.   On: 12/24/2020 16:21   CT CHEST W CONTRAST  Result Date: 12/26/2020 CLINICAL DATA:  Non-small cell lung cancer restaging. Status post chemo radiation therapy. EXAM: CT CHEST WITH CONTRAST TECHNIQUE: Multidetector CT  imaging of the chest was performed during intravenous contrast administration. CONTRAST:  21m OMNIPAQUE IOHEXOL 350 MG/ML SOLN COMPARISON:  CT 07/26/2018 FINDINGS: Cardiovascular: Port in the anterior chest wall with tip in distal SVC. Pacer leads in the RIGHT heart. Mediastinum/Nodes: No axillary or supraclavicular adenopathy. No mediastinal or hilar adenopathy. No pericardial fluid. Esophagus normal. Lungs/Pleura: Volume loss in the RIGHT hemithorax. Interval expansion of the RIGHT lower lobe. Interval decrease in size of RIGHT hilar mass. There is persistent consolidation in the RIGHT lower lobe (image 69/3. No measurable nodularity. There distance collapse in the RIGHT middle lobe which is decreased in size from comparison exam. Difficult to tell if remaining tissue is atelectasis or malignancy. LEFT lung is clear. Upper Abdomen: Limited view of the liver, kidneys, pancreas are unremarkable. Normal adrenal glands. Benign-appearing cysts of the kidneys. Musculoskeletal: No aggressive osseous lesion. IMPRESSION: 1. There is marked reduction in size of the RIGHT hilar mass. 2. There is marked decrease in the consolidative lung in  the RIGHT middle lobe and RIGHT lower lobe. Difficult to ascertain if the remaining consolidation is malignancy or postobstructive collapse. Favor postobstructive collapsed. Recommend close attention on follow-up. 3. No new mediastinal adenopathy. 4. LEFT lung clear. Electronically Signed   By: Suzy Bouchard M.D.   On: 12/26/2020 19:42   US Venous Img Lower Bilateral (DVT)  Result Date: 12/24/2020 CLINICAL DATA:  Bilateral leg pain. EXAM: BILATERAL LOWER EXTREMITY VENOUS DOPPLER ULTRASOUND TECHNIQUE: Gray-scale sonography with compression, as well as color and duplex ultrasound, were performed to evaluate the deep venous system(s) from the level of the common femoral vein through the popliteal and proximal calf veins. COMPARISON:  None. FINDINGS: VENOUS Normal compressibility of  the BILATERAL common femoral, superficial femoral, and popliteal veins, as well as the visualized calf veins. THE RIGHT posterior tibial and peroneal veins are poorly visualized. Visualized portions of the BILATERAL profunda femoral veins and BILATERAL great saphenous veins are unremarkable. No filling defects to suggest DVT on grayscale or color Doppler imaging. Doppler waveforms show normal direction of venous flow, normal respiratory plasticity and response to augmentation. Limited views of the contralateral common femoral vein are unremarkable. OTHER None. Limitations: none IMPRESSION: No evidence of DVT within the RIGHT or LEFT lower extremity. Electronically Signed   By: Virgina Norfolk M.D.   On: 12/24/2020 23:57    Microbiology: Recent Results (from the past 240 hour(s))  Resp Panel by RT-PCR (Flu A&B, Covid) Nasopharyngeal Swab     Status: None   Collection Time: 12/24/20  4:57 PM   Specimen: Nasopharyngeal Swab; Nasopharyngeal(NP) swabs in vial transport medium  Result Value Ref Range Status   SARS Coronavirus 2 by RT PCR NEGATIVE NEGATIVE Final    Comment: (NOTE) SARS-CoV-2 target nucleic acids are NOT DETECTED.  The SARS-CoV-2 RNA is generally detectable in upper respiratory specimens during the acute phase of infection. The lowest concentration of SARS-CoV-2 viral copies this assay can detect is 138 copies/mL. A negative result does not preclude SARS-Cov-2 infection and should not be used as the sole basis for treatment or other patient management decisions. A negative result may occur with  improper specimen collection/handling, submission of specimen other than nasopharyngeal swab, presence of viral mutation(s) within the areas targeted by this assay, and inadequate number of viral copies(<138 copies/mL). A negative result must be combined with clinical observations, patient history, and epidemiological information. The expected result is Negative.  Fact Sheet for Patients:   EntrepreneurPulse.com.au  Fact Sheet for Healthcare Providers:  IncredibleEmployment.be  This test is no t yet approved or cleared by the [place] FDA and  has been authorized for detection and/or diagnosis of SARS-CoV-2 by FDA under an Emergency Use Authorization (EUA). This EUA will remain  in effect (meaning this test can be used) for the duration of the COVID-19 declaration under Section 564(b)(1) of the Act, 21 U.S.C.section 360bbb-3(b)(1), unless the authorization is terminated  or revoked sooner.       Influenza A by PCR NEGATIVE NEGATIVE Final   Influenza B by PCR NEGATIVE NEGATIVE Final    Comment: (NOTE) The Xpert Xpress SARS-CoV-2/FLU/RSV plus assay is intended as an aid in the diagnosis of influenza from Nasopharyngeal swab specimens and should not be used as a sole basis for treatment. Nasal washings and aspirates are unacceptable for Xpert Xpress SARS-CoV-2/FLU/RSV testing.  Fact Sheet for Patients: EntrepreneurPulse.com.au  Fact Sheet for Healthcare Providers: IncredibleEmployment.be  This test is not yet approved or cleared by the [place] and has been authorized  for detection and/or diagnosis of SARS-CoV-2 by FDA under an Emergency Use Authorization (EUA). This EUA will remain in effect (meaning this test can be used) for the duration of the COVID-19 declaration under Section 564(b)(1) of the Act, 21 U.S.C. section 360bbb-3(b)(1), unless the authorization is terminated or revoked.  Performed at [facility], [address], [place], [place] [address]   Urine Culture     Status: Abnormal   Collection Time: 12/24/20  5:50 PM   Specimen: Urine, Random  Result Value Ref Range Status   Specimen Description   Final    URINE, RANDOM Performed at [facility], [address], [place], [place] [address]    Special Requests   Final    NONE Performed at  [facility], [place], [place] [address]    Culture (A)  Final    30,000 COLONIES/mL ENTEROCOCCUS FAECALIS >=100,000 COLONIES/mL DIPHTHEROIDS(CORYNEBACTERIUM SPECIES) Standardized susceptibility testing for this organism is not available. Performed at [facility], [place] [address], [place], [place] [address]    Report Status 12/28/2020 FINAL  Final   Organism ID, Bacteria ENTEROCOCCUS FAECALIS (A)  Final      Susceptibility   Enterococcus faecalis - MIC*    AMPICILLIN >=32 RESISTANT Resistant     NITROFURANTOIN 256 RESISTANT Resistant     VANCOMYCIN <=0.5 SENSITIVE Sensitive     * 30,000 COLONIES/mL ENTEROCOCCUS FAECALIS     Labs: Basic Metabolic Panel: Recent Labs  Lab 12/24/20 1510 12/24/20 2033 12/25/20 0535 12/26/20 0421 12/28/20 0612  NA 135 137 137 135 137  K 3.2* 3.0* 4.1 3.8 3.7  CL 97* 99 101 101 104  CO2 22 26 27 26 25   GLUCOSE 151* 106* 107* 75 60*  BUN 35* 32* 24* 17 12  CREATININE 0.78 0.69 0.51 0.45 <0.30*  CALCIUM 8.6* 8.5* 8.2* 8.2* 7.9*  MG 1.7  --   --  1.7  --   PHOS  --   --   --  2.4* 2.9   Liver Function Tests: Recent Labs  Lab 12/28/20 0612  ALBUMIN 1.9*   No results for input(s): LIPASE, AMYLASE in the last 168 hours. Recent Labs  Lab 12/24/20 2331  AMMONIA 17   CBC: Recent Labs  Lab 12/24/20 1510 12/25/20 0535 12/26/20 0421 12/28/20 0612  WBC 6.3 5.6 4.3 4.7  HGB 13.0 11.2* 10.0* 9.2*  HCT 38.2 33.9* 31.3* 28.3*  MCV 97.2 98.5 99.4 100.4*  PLT PLATELET CLUMPS NOTED ON SMEAR, UNABLE TO ESTIMATE 55* 54* 59*   Cardiac Enzymes: No results for input(s): CKTOTAL, CKMB, CKMBINDEX, TROPONINI in the last 168 hours. BNP: BNP (last 3 results) Recent Labs    11/07/20 1925  BNP 151.4*    ProBNP (last 3 results) No results for input(s): PROBNP in the last 8760 hours.  CBG: Recent Labs  Lab 12/24/20 2046  GLUCAP 103*       Signed:  Florencia Reasons MD, PhD, FACP  [facility] 12/29/2020, 8:49  AM

## 2020-12-29 NOTE — TOC Transition Note (Signed)
Transition of Care [facility]) - CM/SW Discharge Note   Patient Details  Name: YOLUNDA KLOOS MRN: [id number] Date of Birth: 1934-02-26  Transition of Care [facility]) CM/SW Contact:  Shelbie Hutching, RN Phone Number: 12/29/2020, 8:49 AM   Clinical Narrative:    Patient will discharge home with hospice through [facility] this afternoon.  Patient's niece, [name], does not get off work until after 4 and requests that transport not be until 6pm.  They are trying to re-arrange the patient's bed to go into the living room.  RNCM will set up [facility] for after 5:30 pm.     Final next level of care: Home w Hospice Care Barriers to Discharge: Barriers Resolved   Patient Goals and CMS Choice Patient states their goals for this hospitalization and ongoing recovery are:: Family has decided to take patient home with hospice CMS Medicare.gov Compare Post Acute Care list provided to:: Patient Represenative (must comment) Choice offered to / list presented to : [facility] POA / Guardian  Discharge Placement                Patient to be transferred to facility by: Transfered home by [facility] Name of family member notified: Merita Norton Patient and family notified of of transfer: 12/29/20  Discharge Plan and Services                DME Arranged: Overbed table DME Agency: Hospice and [place] Arranged: NA          Social Determinants of Health (SDOH) Interventions     Readmission Risk Interventions No flowsheet data found.

## 2020-12-29 NOTE — Progress Notes (Signed)
[facility]) Hospital Liaison Note  Patient with discharge summary in. Niece requested transport to not be arranged until 6pm.  Manufacturing engineer continues to follow for discharge.  Please call with any hospice related questions.  Thank you, Margaretmary Eddy, BSN, RN [facility] Liaison [phone]

## 2020-12-29 NOTE — Care Management Important Message (Signed)
Important Message  Patient Details  Name: RODNESHIA GREENHOUSE MRN: [id number] Date of Birth: 1933-05-26   Medicare Important Message Given:  Other (see comment)  Patient is going home with Hospice today.  Out of respect for the patient and family no Important Message from [facility] given.  Juliann Pulse A Zarin Knupp 12/29/2020, 9:36 AM

## 2020-12-29 NOTE — Care Management Important Message (Signed)
Important Message  Patient Details  Name: [name] MRN: [id number] Date of Birth: [birth year]   Medicare Important Message Given:  Other (see comment)  Patient is discharging home with Hospice. Out of respect for the patient and family no Important Message from [facility] given.  Juliann Pulse A Tavonte Seybold 12/29/2020, 7:39 AM

## 2021-01-09 ENCOUNTER — Ambulatory Visit: Payer: Medicare HMO | Admitting: Nurse Practitioner

## 2021-01-09 ENCOUNTER — Other Ambulatory Visit: Payer: Medicare HMO

## 2021-01-28 DEATH — deceased
